# Patient Record
Sex: Male | Born: 1935 | Race: White | Hispanic: No | Marital: Married | Smoking: Never smoker
Health system: Southern US, Community
[De-identification: ages and names within clinical notes are randomized; demographics above are authoritative.]

## PROBLEM LIST (undated history)

## (undated) DIAGNOSIS — K635 Polyp of colon: Secondary | ICD-10-CM

## (undated) DIAGNOSIS — R55 Syncope and collapse: Secondary | ICD-10-CM

## (undated) DIAGNOSIS — R569 Unspecified convulsions: Secondary | ICD-10-CM

## (undated) DIAGNOSIS — T1490XA Injury, unspecified, initial encounter: Secondary | ICD-10-CM

## (undated) DIAGNOSIS — E785 Hyperlipidemia, unspecified: Secondary | ICD-10-CM

## (undated) DIAGNOSIS — R339 Retention of urine, unspecified: Secondary | ICD-10-CM

## (undated) DIAGNOSIS — M1990 Unspecified osteoarthritis, unspecified site: Secondary | ICD-10-CM

## (undated) DIAGNOSIS — I10 Essential (primary) hypertension: Secondary | ICD-10-CM

## (undated) DIAGNOSIS — I442 Atrioventricular block, complete: Secondary | ICD-10-CM

## (undated) DIAGNOSIS — R7301 Impaired fasting glucose: Secondary | ICD-10-CM

## (undated) DIAGNOSIS — E119 Type 2 diabetes mellitus without complications: Secondary | ICD-10-CM

## (undated) DIAGNOSIS — Z950 Presence of cardiac pacemaker: Secondary | ICD-10-CM

## (undated) DIAGNOSIS — K219 Gastro-esophageal reflux disease without esophagitis: Secondary | ICD-10-CM

## (undated) DIAGNOSIS — I2510 Atherosclerotic heart disease of native coronary artery without angina pectoris: Secondary | ICD-10-CM

## (undated) DIAGNOSIS — T4145XA Adverse effect of unspecified anesthetic, initial encounter: Secondary | ICD-10-CM

## (undated) DIAGNOSIS — K5909 Other constipation: Secondary | ICD-10-CM

## (undated) DIAGNOSIS — Z961 Presence of intraocular lens: Secondary | ICD-10-CM

## (undated) DIAGNOSIS — T8859XA Other complications of anesthesia, initial encounter: Secondary | ICD-10-CM

## (undated) HISTORY — DX: Essential (primary) hypertension: I10

## (undated) HISTORY — DX: Injury, unspecified, initial encounter: T14.90XA

## (undated) HISTORY — DX: Hyperlipidemia, unspecified: E78.5

## (undated) HISTORY — PX: FEMORAL HERNIA REPAIR: SHX632

## (undated) HISTORY — DX: Impaired fasting glucose: R73.01

## (undated) HISTORY — DX: Polyp of colon: K63.5

## (undated) HISTORY — PX: PACEMAKER PLACEMENT: SHX43

## (undated) HISTORY — PX: COLONOSCOPY W/ POLYPECTOMY: SHX1380

---
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
## 2001-01-24 ENCOUNTER — Encounter: Admission: RE | Admit: 2001-01-24 | Discharge: 2001-01-24 | Payer: Self-pay | Admitting: Oncology

## 2001-01-24 ENCOUNTER — Encounter (HOSPITAL_COMMUNITY): Admission: RE | Admit: 2001-01-24 | Discharge: 2001-02-23 | Payer: Self-pay | Admitting: Oncology

## 2003-07-05 ENCOUNTER — Ambulatory Visit (HOSPITAL_COMMUNITY): Admission: RE | Admit: 2003-07-05 | Discharge: 2003-07-05 | Payer: Self-pay | Admitting: Internal Medicine

## 2009-08-07 ENCOUNTER — Encounter (INDEPENDENT_AMBULATORY_CARE_PROVIDER_SITE_OTHER): Payer: Self-pay | Admitting: *Deleted

## 2009-08-07 LAB — CONVERTED CEMR LAB
ALT: 15 units/L
Albumin: 4.5 g/dL
BUN: 11 mg/dL
CO2: 27 meq/L
Glucose, Bld: 142 mg/dL
Hgb A1c MFr Bld: 6.4 %
LDL Cholesterol: 48 mg/dL
Potassium: 4.4 meq/L
Sodium: 136 meq/L

## 2009-09-26 ENCOUNTER — Ambulatory Visit: Payer: Self-pay | Admitting: Cardiology

## 2009-09-26 ENCOUNTER — Inpatient Hospital Stay (HOSPITAL_COMMUNITY): Admission: EM | Admit: 2009-09-26 | Discharge: 2009-09-28 | Payer: Self-pay | Admitting: Emergency Medicine

## 2009-09-26 ENCOUNTER — Encounter (INDEPENDENT_AMBULATORY_CARE_PROVIDER_SITE_OTHER): Payer: Self-pay | Admitting: *Deleted

## 2009-09-26 LAB — CONVERTED CEMR LAB
BUN: 9 mg/dL
Glucose, Bld: 139 mg/dL
HDL: 50 mg/dL
LDL Cholesterol: 64 mg/dL
Triglycerides: 130 mg/dL
WBC: 10.2 10*3/uL

## 2009-09-27 ENCOUNTER — Other Ambulatory Visit: Payer: Self-pay | Admitting: Orthopedic Surgery

## 2009-09-27 ENCOUNTER — Encounter (INDEPENDENT_AMBULATORY_CARE_PROVIDER_SITE_OTHER): Payer: Self-pay | Admitting: *Deleted

## 2009-09-27 ENCOUNTER — Ambulatory Visit: Payer: Self-pay | Admitting: Cardiovascular Disease

## 2009-09-27 ENCOUNTER — Ambulatory Visit: Payer: Self-pay | Admitting: Thoracic Surgery (Cardiothoracic Vascular Surgery)

## 2009-09-27 LAB — CONVERTED CEMR LAB
BUN: 15 mg/dL
GFR calc non Af Amer: >60 mL/min
Hemoglobin: 15.4 g/dL
Platelets: 171 10*3/uL
Sodium: 136 meq/L
WBC: 9.3 10*3/uL

## 2009-09-28 ENCOUNTER — Other Ambulatory Visit: Payer: Self-pay | Admitting: Orthopedic Surgery

## 2009-09-28 ENCOUNTER — Other Ambulatory Visit: Payer: Self-pay | Admitting: Cardiovascular Disease

## 2009-09-29 HISTORY — PX: CORONARY ARTERY BYPASS GRAFT: SHX141

## 2009-09-30 ENCOUNTER — Encounter (INDEPENDENT_AMBULATORY_CARE_PROVIDER_SITE_OTHER): Payer: Self-pay | Admitting: *Deleted

## 2009-09-30 DIAGNOSIS — E785 Hyperlipidemia, unspecified: Secondary | ICD-10-CM | POA: Insufficient documentation

## 2009-09-30 DIAGNOSIS — I10 Essential (primary) hypertension: Secondary | ICD-10-CM | POA: Insufficient documentation

## 2009-10-02 ENCOUNTER — Ambulatory Visit: Payer: Self-pay | Admitting: Cardiology

## 2009-10-02 DIAGNOSIS — E119 Type 2 diabetes mellitus without complications: Secondary | ICD-10-CM | POA: Insufficient documentation

## 2009-10-02 DIAGNOSIS — E1159 Type 2 diabetes mellitus with other circulatory complications: Secondary | ICD-10-CM | POA: Insufficient documentation

## 2009-10-07 ENCOUNTER — Ambulatory Visit: Payer: Self-pay | Admitting: Thoracic Surgery (Cardiothoracic Vascular Surgery)

## 2009-10-07 ENCOUNTER — Encounter: Payer: Self-pay | Admitting: Thoracic Surgery (Cardiothoracic Vascular Surgery)

## 2009-10-10 ENCOUNTER — Encounter: Payer: Self-pay | Admitting: Thoracic Surgery (Cardiothoracic Vascular Surgery)

## 2009-10-10 ENCOUNTER — Ambulatory Visit: Payer: Self-pay | Admitting: Cardiovascular Disease

## 2009-10-10 ENCOUNTER — Inpatient Hospital Stay (HOSPITAL_COMMUNITY)
Admission: RE | Admit: 2009-10-10 | Discharge: 2009-10-15 | Payer: Self-pay | Admitting: Thoracic Surgery (Cardiothoracic Vascular Surgery)

## 2009-10-23 ENCOUNTER — Ambulatory Visit: Payer: Self-pay | Admitting: Cardiology

## 2009-10-23 ENCOUNTER — Encounter: Payer: Self-pay | Admitting: Adult Health

## 2009-11-01 ENCOUNTER — Emergency Department (HOSPITAL_COMMUNITY): Admission: EM | Admit: 2009-11-01 | Discharge: 2009-11-01 | Payer: Self-pay | Admitting: Emergency Medicine

## 2009-11-04 ENCOUNTER — Encounter
Admission: RE | Admit: 2009-11-04 | Discharge: 2009-11-04 | Payer: Self-pay | Admitting: Thoracic Surgery (Cardiothoracic Vascular Surgery)

## 2009-11-04 ENCOUNTER — Ambulatory Visit: Payer: Self-pay | Admitting: Thoracic Surgery (Cardiothoracic Vascular Surgery)

## 2009-11-18 ENCOUNTER — Encounter (INDEPENDENT_AMBULATORY_CARE_PROVIDER_SITE_OTHER): Payer: Self-pay | Admitting: *Deleted

## 2009-11-25 ENCOUNTER — Ambulatory Visit: Payer: Self-pay | Admitting: Cardiology

## 2009-12-03 ENCOUNTER — Ambulatory Visit: Payer: Self-pay | Admitting: Cardiology

## 2009-12-11 ENCOUNTER — Encounter (INDEPENDENT_AMBULATORY_CARE_PROVIDER_SITE_OTHER): Payer: Self-pay | Admitting: *Deleted

## 2009-12-11 LAB — CONVERTED CEMR LAB
Albumin: 4.3 g/dL
Calcium: 9.8 mg/dL
Cholesterol: 91 mg/dL
Creatinine, Ser: 0.79 mg/dL
Eosinophils Relative: 0.2 %
HDL: 48 mg/dL
Hgb A1c MFr Bld: 5.7 %
Lymphocytes Relative: 27 %
Lymphs Abs: 1.3 10*3/uL
MCHC: 31.4 g/dL
MCV: 90.3 fL
Platelets: 158 10*3/uL
Potassium: 3.9 meq/L
RDW: 14.1 %
Sodium: 141 meq/L
WBC: 4.9 10*3/uL

## 2009-12-25 ENCOUNTER — Encounter: Payer: Self-pay | Admitting: Adult Health

## 2009-12-30 ENCOUNTER — Encounter: Payer: Self-pay | Admitting: Cardiovascular Disease

## 2009-12-30 ENCOUNTER — Ambulatory Visit: Payer: Self-pay | Admitting: Thoracic Surgery (Cardiothoracic Vascular Surgery)

## 2009-12-30 ENCOUNTER — Encounter
Admission: RE | Admit: 2009-12-30 | Discharge: 2009-12-30 | Payer: Self-pay | Admitting: Thoracic Surgery (Cardiothoracic Vascular Surgery)

## 2010-02-18 ENCOUNTER — Encounter (INDEPENDENT_AMBULATORY_CARE_PROVIDER_SITE_OTHER): Payer: Self-pay | Admitting: *Deleted

## 2010-02-24 ENCOUNTER — Ambulatory Visit: Payer: Self-pay | Admitting: Cardiology

## 2010-04-07 ENCOUNTER — Ambulatory Visit: Payer: Self-pay | Admitting: Cardiology

## 2010-04-15 ENCOUNTER — Encounter (INDEPENDENT_AMBULATORY_CARE_PROVIDER_SITE_OTHER): Payer: Self-pay

## 2010-04-15 LAB — CONVERTED CEMR LAB
ALT: 17 units/L
Albumin: 4.5 g/dL
Alkaline Phosphatase: 73 units/L
Alkaline Phosphatase: 73 units/L
CO2: 28 meq/L
Calcium: 10.3 mg/dL
Chloride: 102 meq/L
Cholesterol: 129 mg/dL
Creatinine, Ser: 0.81 mg/dL
Creatinine, Ser: 0.81 mg/dL
Glucose, Bld: 123 mg/dL
HDL: 55 mg/dL
Hgb A1c MFr Bld: 5.8 %
Sodium: 139 meq/L
Triglycerides: 60 mg/dL

## 2010-04-18 ENCOUNTER — Encounter (INDEPENDENT_AMBULATORY_CARE_PROVIDER_SITE_OTHER): Payer: Self-pay

## 2010-06-17 ENCOUNTER — Telehealth (INDEPENDENT_AMBULATORY_CARE_PROVIDER_SITE_OTHER): Payer: Self-pay

## 2010-06-22 ENCOUNTER — Encounter: Payer: Self-pay | Admitting: Thoracic Surgery (Cardiothoracic Vascular Surgery)

## 2010-07-03 NOTE — Assessment & Plan Note (Signed)
Summary: post cabg per [address] office/sn   Visit Type:  Follow-up Primary Provider:  Dr. Butch Penny  CC:  no cardiology complaints today.  History of Present Illness: Mr Guy Roberson is a very pleasant 77 y/o CM who we are seeing on follow-up after undergoing CABG per Dr. Cornelius Moras 10/10/2009 (LIMA-LAD;svg-Diag 1;SVG to OM; SVG-PDA, with endoscopic harvesting from R. thigh and R lower leg.).  This was completed after abnormal cardiac cath performed by Dr. Clifton James revealing severe 3 vessel CAD with preserved LV fx.  He was referred for CABG. Also during hosptialization, he was found to have urinary retention and urology placed on indwelling f/c which he continues to wear with a leg bag per Dr. Vernie Ammons. He was placed on Tamsulosin at that time.  Since discharge he is feeling stronger, but not as active as in the past.  He is taking each day at a time and walking as much as he can before fatigued.  He denies SOB, severe pain or fluid retention.  He has some complaints of posteral dizziness when he awakens in the morning to empty his catheter.  He weighs himself daily and has actually lost 6 lbs since discharge.  Current Medications (verified): 1)  Temazepam 30 Mg Caps (Temazepam) .... Take 1 Tab Nightly 2)  Metoprolol Tartrate 100 Mg Tabs (Metoprolol Tartrate) .... Take 1/2  Tablet By Mouth Twice A Day 3)  Simvastatin 20 Mg Tabs (Simvastatin) .... Take 1 Tab Daily 4)  Ciprofloxacin Hcl 250 Mg Tabs (Ciprofloxacin Hcl) .... Take 1 Tab Two Times A Day 5)  Furosemide 40 Mg Tabs (Furosemide) .... Take 1 Tab Daily 6)  Lisinopril 10 Mg Tabs (Lisinopril) .... Take 1 Tab Daily 7)  Ferrex 150 150 Mg Caps (Polysaccharide Iron Complex) .... Take 1 Tab Daily 8)  Potassium Chloride 20 Meq Pack (Potassium Chloride) .... Take 1 Tab Daily 9)  Tramadol Hcl 50 Mg Tabs (Tramadol Hcl) .... Take 4 To 6 Hrs As Needed For Pain 10)  Aspir-Trin 325 Mg Tbec (Aspirin) .... Take 1 Tab Daily 11)  Tamsulosin Hcl 0.4 Mg Caps  (Tamsulosin Hcl) .... Take 1 Tab Daily  Allergies (verified): 1)  ! Penicillin  Past History:  Past Surgical History: Hernia repair bladder surgery pelvis and spianl surgery CABG May 10/10/09  Social History: Reviewed history from 09/30/2009 and no changes required. Retired  Married  Tobacco Use - Yes. (smokeless tobacco) Alcohol Use - no Regular Exercise - yes Drug Use - no  Review of Systems       Postural dizziness.  Urinary retention. Post-operative soreness.  All other systems have been reviewed and are negative unless stated above.   Vital Signs:  Patient profile:   77 year old male Weight:      164 pounds Pulse rate:   64 / minute BP sitting:   110 / 54  (right arm)  Vitals Entered By: Dreama Saa, CNA (Oct 23, 2009 1:26 PM)  Physical Exam  General:  healthy appearing.   Chest Wall:  Medial well healed sternotomy incision with some old ecchymosis noted.  No bleeding or signs of infection. Lungs:  Clear bilaterally to auscultation and percussion. Heart:  RRR distant without MRG Abdomen:  Bowel sounds positive; abdomen soft and non-tender without masses, organomegaly, or hernias noted. No hepatosplenomegaly. Genitalia:  Urinary catheter leg bag in place. Msk:  Back normal, normal gait. Muscle strength and tone normal. Extremities:  Healing SVG harvest sites on R lower leg. Psych:  Normal affect.  Impression & Recommendations:  Problem # 1:  CAD, ARTERY BYPASS GRAFT (ICD-414.04) Mr. Trumbull is doing well post-operatively.  He is slowly increasing his activity and exercise.  I have cautioned him on the need to take things slowily and to rest on days when his energy is low.  His HR and BP are well controlled.  He denies symptoms.  I have discussed the LEE edema which is going to be normal on the SVG harvest leg.  He should continue to weigh daily and report any evidence of fluid retention or SOB. The following medications were removed from the medication list:     Isosorbide Mononitrate Cr 30 Mg Xr24h-tab (Isosorbide mononitrate) .Marland Kitchen... Take 1/2  tablet by mouth daily    Nitrostat 0.4 Mg Subl (Nitroglycerin) .Marland Kitchen... 1 tablet under tongue at onset of chest pain; you may repeat every 5 minutes for up to 3 doses. His updated medication list for this problem includes:    Metoprolol Tartrate 100 Mg Tabs (Metoprolol tartrate) .Marland Kitchen... Take 1/2  tablet by mouth twice a day    Lisinopril 10 Mg Tabs (Lisinopril) .Marland Kitchen... Take 1 tab daily    Aspir-trin 325 Mg Tbec (Aspirin) .Marland Kitchen... Take 1 tab daily  Problem # 2:  URINARY RETENTION (ICD-788.20) I have ordered him a large catheter bag to use for HS.  He complains of over flowing leg bag during the night.,  I have discussed aspectic technique when changing the bags.  The early morning dizziness may be related to use of tamsulosin.  He is advised to be careful when standing. Orders: Durable Medical Equipment (DME)  Patient Instructions: 1)  Your physician recommends that you schedule a follow-up appointment in: 1 month

## 2010-07-03 NOTE — Progress Notes (Signed)
Summary: [facility]   [facility]   Imported By: Roderic Ovens 01/08/2010 14:54:09  _____________________________________________________________________  External Attachment:    Type:   Image     Comment:   External Document

## 2010-07-03 NOTE — Progress Notes (Signed)
**Note De-Identified [name] Obfuscation** Summary: DOES PT NEEDS ANTIBIOTICS FOR DENTIST TO PULL TEETH  Phone Note From Other Clinic Call back at [phone]   Caller: DR [facility] Request: Talk with Nurse Summary of Call: PT NEEDS TO HAVE SOME TEETH EXTRACTED AND DR Myna Bright NEEDS TO KNOW IF PATIENT NEEDS TO BE ON ANTIBIOTICS. Initial call taken by: Faythe Ghee,  June 17, 2010 3:06 PM  Follow-up for Phone Call        Dr. Georgiann Mccoy office advised that pt. does not need abx  before dental procedures. Follow-up by: Larita Fife Kailey Esquilin LPN,  June 17, 2010 4:51 PM

## 2010-07-03 NOTE — Assessment & Plan Note (Signed)
Summary: 1 mth f/u per checkout on 10/23/09/tg   Primary Provider:  Dr. Butch Penny  CC:  None.  History of Present Illness: Guy Roberson is a very pleasant 77 y/o CM who we are seeing on follow-up after undergoing CABG per Dr. Cornelius Moras 10/10/2009 (LIMA-LAD;svg-Diag 1;SVG to OM; SVG-PDA, with endoscopic harvesting from R. thigh and R lower leg.).  This was completed after abnormal cardiac cath performed by Dr. Clifton James revealing severe 3 vessel CAD with preserved LV fx.  He was referred for CABG. Also during hosptialization, he was found to have urinary retention and urology placed on indwelling f/c This was removed 2 weeks ago. Since last visit, he continues to feel stronger and is walking two-three times a day.  This is prompted by a small dog which requires frequent walks outside.  He never goes more than a quarter of a mile each time.  He continues on his wt restrictions for lifting things over 10 lbs.  Denies any exerional dyspnea or chest pain.  " I feel good!"  He had been taking propranolol 20mg  up until his surgery, but since discharge this was not listed on his medications.  He took this intially for "shaking" but this is no longer an issue.  Preventive Screening-Counseling & Management  Alcohol-Tobacco     Smoking Status: never  Current Medications (verified): 1)  Temazepam 30 Mg Caps (Temazepam) .... Take 1 Tab Nightly 2)  Metoprolol Tartrate 100 Mg Tabs (Metoprolol Tartrate) .... Take 1/2  Tablet By Mouth Twice A Day 3)  Simvastatin 20 Mg Tabs (Simvastatin) .... Take 1 Tab Daily 4)  Lisinopril 20 Mg Tabs (Lisinopril) .... Take One Tablet By Mouth Daily 5)  Tramadol Hcl 50 Mg Tabs (Tramadol Hcl) .... Take 4 To 6 Hrs As Needed For Pain 6)  Aspir-Trin 325 Mg Tbec (Aspirin) .... Take 1 Tab Daily 7)  Tamsulosin Hcl 0.4 Mg Caps (Tamsulosin Hcl) .... Take 1 Tab Daily 8)  Vitamin D 400 Unit Caps (Cholecalciferol) .Marland Kitchen.. 1 Tablet By Mouth Once Daily  Allergies (verified): 1)  !  Penicillin  Social History: Smoking Status:  never  Review of Systems       All other systems have been reviewed and are negative unless stated above.   Vital Signs:  Patient profile:   77 year old male Weight:      158 pounds O2 Sat:      98 % Pulse rate:   62 / minute BP sitting:   147 / 60  (left arm)  Vitals Entered ByLarita Fife Via LPN (November 25, 2009 11:13 AM)  Physical Exam  General:  Well developed, well nourished, in no acute distress. Neck:  Neck supple, no JVD. No masses, thyromegaly or abnormal cervical nodes. Chest Wall:  Well healed sternotomy scar. Lungs:  CTA. No wheezes.  Good inspiratory effort. Heart:  Distant with RRR no MRG Abdomen:  Bowel sounds positive; abdomen soft and non-tender without masses, organomegaly, or hernias noted. No hepatosplenomegaly. Msk:  Back normal, normal gait. Muscle strength and tone normal. Pulses:  pulses normal in all 4 extremities Extremities:  Well healed vein graft scars/incisions Neurologic:  Alert and oriented x 3. Psych:  Normal affect.   Impression & Recommendations:  Problem # 1:  CAD, ARTERY BYPASS GRAFT (ICD-414.04) He is doing well from a CV standpoint.  He is without complaint and he is becoming more active, albeit slowly. He is adhereing to a cardiac diet and has lost approx 14 lbs since discharge from  CABG. His updated medication list for this problem includes:    Metoprolol Tartrate 100 Mg Tabs (Metoprolol tartrate) .Marland Kitchen... Take 1/2  tablet by mouth twice a day    Lisinopril 20 Mg Tabs (Lisinopril) .Marland Kitchen... Take one tablet by mouth daily    Aspir-trin 325 Mg Tbec (Aspirin) .Marland Kitchen... Take 1 tab daily  Problem # 2:  HYPERTENSION (ICD-401.9) I will not restart the propanolol and instead increase the dose of lisinopril to 20mg  daily from 10mg  daily.  He will return to the office for a BP check in one week.  Should he improve his BP will continue this. Will need to have BMET drawn in one month.  See him in 3 months. The  following medications were removed from the medication list:    Furosemide 40 Mg Tabs (Furosemide) .Marland Kitchen... Take 1 tab daily His updated medication list for this problem includes:    Metoprolol Tartrate 100 Mg Tabs (Metoprolol tartrate) .Marland Kitchen... Take 1/2  tablet by mouth twice a day    Lisinopril 20 Mg Tabs (Lisinopril) .Marland Kitchen... Take one tablet by mouth daily    Aspir-trin 325 Mg Tbec (Aspirin) .Marland Kitchen... Take 1 tab daily  Patient Instructions: 1)  Your physician recommends that you schedule a follow-up appointment in: 3 months 2)  Your physician has recommended you make the following change in your medication:  increase lisinopril to 20mg  daily 3)  You have been referred to nurse visit in 1 week for bp check Prescriptions: LISINOPRIL 20 MG TABS (LISINOPRIL) Take one tablet by mouth daily  #30 x 3   Entered by:   Teressa Lower RN   Authorized by:   Joni Reining, NP   Signed by:   Teressa Lower RN on 11/25/2009   Method used:   Electronically to        [facility]* (retail)       [address]       [place], [place]  [address]       Ph: [phone] or [phone]       Fax: [phone]   RxID:   [id number]

## 2010-07-03 NOTE — Miscellaneous (Signed)
Summary: metoprolol refill  Clinical Lists Changes  Medications: Changed medication from METOPROLOL TARTRATE 100 MG TABS (METOPROLOL TARTRATE) Take 1/2  tablet by mouth twice a day to METOPROLOL TARTRATE 50 MG TABS (METOPROLOL TARTRATE) Take one tablet by mouth twice a day - Signed Rx of METOPROLOL TARTRATE 50 MG TABS (METOPROLOL TARTRATE) Take one tablet by mouth twice a day;  #60 x 6;  Signed;  Entered by: Teressa Lower RN;  Authorized by: Joni Reining, NP;  Method used: Electronically to [facility]*, [address], [place], [place], [place]  [address], Ph: [phone] or [phone], Fax: [phone]    Prescriptions: METOPROLOL TARTRATE 50 MG TABS (METOPROLOL TARTRATE) Take one tablet by mouth twice a day  #60 x 6   Entered by:   Teressa Lower RN   Authorized by:   Joni Reining, NP   Signed by:   Teressa Lower RN on 12/25/2009   Method used:   Electronically to        [facility] (retail)       [address]       [place], [place]  [address]       Ph: [phone] or [phone]       Fax: [phone]   RxID:   [id number]

## 2010-07-03 NOTE — Miscellaneous (Signed)
Summary: LABS CBCD,CMPLIPIDS,A1C,12/11/2009  Clinical Lists Changes  Observations: Added new observation of CALCIUM: 9.8 mg/dL (16/03/9603 54:09) Added new observation of ALBUMIN: 4.3 g/dL (81/19/1478 29:56) Added new observation of PROTEIN, TOT: 6.5 g/dL (21/30/8657 84:69) Added new observation of SGPT (ALT): 13 units/L (12/11/2009 10:52) Added new observation of SGOT (AST): 17 units/L (12/11/2009 10:52) Added new observation of ALK PHOS: 61 units/L (12/11/2009 10:52) Added new observation of CREATININE: 0.79 mg/dL (62/95/2841 32:44) Added new observation of BUN: 11 mg/dL (06/03/7251 66:44) Added new observation of BG RANDOM: 128 mg/dL (03/47/4259 56:38) Added new observation of CO2 PLSM/SER: 26 meq/L (12/11/2009 10:52) Added new observation of CL SERUM: 106 meq/L (12/11/2009 10:52) Added new observation of K SERUM: 3.9 meq/L (12/11/2009 10:52) Added new observation of NA: 141 meq/L (12/11/2009 10:52) Added new observation of LDL: 28 mg/dL (75/64/3329 51:88) Added new observation of HDL: 48 mg/dL (41/66/0630 16:01) Added new observation of TRIGLYC TOT: 73 mg/dL (09/32/3557 32:20) Added new observation of CHOLESTEROL: 91 mg/dL (25/42/7062 37:62) Added new observation of HGBA1C: 5.7 % (12/11/2009 10:52) Added new observation of EOS ABSLT: 0.0 K/uL (12/11/2009 10:52) Added new observation of % EOS AUTO: 0.2 % (12/11/2009 10:52) Added new observation of ABSOLUTE MON: 0.6 K/uL (12/11/2009 10:52) Added new observation of MONOCYTE %: 11 % (12/11/2009 10:52) Added new observation of ABS LYMPHOCY: 1.3 K/uL (12/11/2009 10:52) Added new observation of LYMPHS %: 27 % (12/11/2009 10:52) Added new observation of PLATELETK/UL: 158 K/uL (12/11/2009 10:52) Added new observation of RDW: 14.1 % (12/11/2009 10:52) Added new observation of MCHC RBC: 31.4 g/dL (83/15/1761 60:73) Added new observation of MCV: 90.3 fL (12/11/2009 10:52) Added new observation of HCT: 44.6 % (12/11/2009 10:52) Added new  observation of HGB: 14.0 g/dL (71/10/2692 85:46) Added new observation of RBC M/UL: 4.94 M/uL (12/11/2009 10:52) Added new observation of WBC COUNT: 4.9 10*3/microliter (12/11/2009 10:52)

## 2010-07-03 NOTE — Assessment & Plan Note (Signed)
Summary: 1 WK NURSE VISIT PER CHECKOUT ON 11/25/09/TG  Nurse Visit   Vital Signs:  Patient profile:   77 year old male Height:      67 inches Weight:      159 pounds Pulse rate:   64 / minute BP sitting:   147 / 58  (right arm)  Vitals Entered By: Dreama Saa, CNA (December 03, 2009 8:26 AM)  Visit Type:  1 week nurse visit Primary Provider:  Dr. Butch Penny   History of Present Illness: S: 1 week nurse visit B: ov 11/25/09 w/ [place], increased lisinopril to 20mg  daily A: only c/o slight epigastric/chest pain consuming hotdogs at a 4th July cookout, he denied radiation, jaw pain or  diaphoresis  R:   Preventive Screening-Counseling & Management  Alcohol-Tobacco     Smoking Status: never  Current Medications (verified): 1)  Temazepam 30 Mg Caps (Temazepam) .... Take 1 Tab Nightly 2)  Metoprolol Tartrate 100 Mg Tabs (Metoprolol Tartrate) .... Take 1/2  Tablet By Mouth Twice A Day 3)  Simvastatin 20 Mg Tabs (Simvastatin) .... Take 1 Tab Daily 4)  Lisinopril 20 Mg Tabs (Lisinopril) .... Take One Tablet By Mouth Daily 5)  Tramadol Hcl 50 Mg Tabs (Tramadol Hcl) .... Take 4 To 6 Hrs As Needed For Pain 6)  Aspir-Trin 325 Mg Tbec (Aspirin) .... Take 1 Tab Daily 7)  Tamsulosin Hcl 0.4 Mg Caps (Tamsulosin Hcl) .... Take 1 Tab Daily 8)  Vitamin D 400 Unit Caps (Cholecalciferol) .Marland Kitchen.. 1 Tablet By Mouth Once Daily  Allergies (verified): 1)  ! Penicillin  Appended Document: 1 WK NURSE VISIT PER CHECKOUT ON 11/25/09/TG LMOM

## 2010-07-03 NOTE — Assessment & Plan Note (Signed)
Summary: 3 MTH F/U PER CHECKOUT ON 11/25/09/TG   Visit Type:  Follow-up Primary Zareya Tuckett:  Dr.Knowlton   History of Present Illness: [name] returns to the office as scheduled for continued assessment and treatment of coronary disease, now 5 months following uncomplicated CABG surgery.  He has done superbly postoperatively with a rapid return to his usual level of activity despite not participating in any formal cardiac rehabilitation program.  He walks his dog multiple times per day, mows 5 yards and is rarely sedentary.  He denies dyspnea, orthopnea, PND, pedal edema or chest discomfort.  Current Medications (verified): 1)  Temazepam 30 Mg Caps (Temazepam) .... Take 1 Tab Nightly 2)  Metoprolol Tartrate 50 Mg Tabs (Metoprolol Tartrate) .... Take One Tablet By Mouth Twice A Day 3)  Simvastatin 20 Mg Tabs (Simvastatin) .... Take 1 Tab Daily 4)  Lisinopril-Hydrochlorothiazide 20-12.5 Mg Tabs (Lisinopril-Hydrochlorothiazide) .... Take 1 Tablet By Mouth Once Daily 5)  Tramadol Hcl 50 Mg Tabs (Tramadol Hcl) .... Take 4 To 6 Hrs As Needed For Pain 6)  Aspir-Trin 325 Mg Tbec (Aspirin) .... Take 1 Tab Daily 7)  Tamsulosin Hcl 0.4 Mg Caps (Tamsulosin Hcl) .... Take 1 Tab Daily 8)  Vitamin B-12 1000 Mcg Tabs (Cyanocobalamin) .... Take 1tab Daily  Allergies (verified): 1)  ! Penicillin  Past History:  PMH, FH, and Social History reviewed and updated.  Review of Systems       See history of present illness  Vital Signs:  Patient profile:   77 year old male Weight:      161 pounds Pulse rate:   54 / minute BP sitting:   145 / 58  (right arm)  Vitals Entered By: Dreama Saa, CNA (February 24, 2010 11:26 AM)  Physical Exam  General:  Trim; well developed; no acute distress:   Neck-No JVD; no carotid bruits: Lungs-No tachypnea, no rales; no rhonchi; no wheezes: Cardiovascular-normal PMI; normal S1 and S2:prominent fourth heart sound; minimal systolic murmur at the  apex Abdomen-BS normal; soft and non-tender without masses or organomegaly:  Musculoskeletal-No deformities, no cyanosis or clubbing: Neurologic-Normal cranial nerves; symmetric strength and tone:  Skin-Warm, no significant lesions: Extremities-1-2+ distal pulses; no edema:     Impression & Recommendations:  Problem # 1:  ATHEROSCLEROTIC CV  DISEASE-CABG (ICD-429.2) Patient is doing beautifully postoperatively and has been released by the cardiovascular surgeons.  He is maintaining a high level of activity without cardiopulmonary symptoms.  Problem # 2:  HYPERLIPIDEMIA (ICD-272.4) Lipid values are extremely low on a low dose of statin, which will be continued.  Problem # 3:  HYPERTENSION (ICD-401.9) Control of hypertension is suboptimal and has been at prior office visits.  Hydrochlorothiazide will be added to his medical regime with home monitoring of blood pressures and a return visit to the cardiology nurses in one month.  If BP is good at that time, will plan to see him again in one year.   BP today: 145/58 Prior BP: 147/58 (12/03/2009)  Labs Reviewed: K+: 3.9 (12/11/2009) Creat: : 0.79 (12/11/2009)   Chol: 91 (12/11/2009)   HDL: 48 (12/11/2009)   LDL: 28 (12/11/2009)   TG: 73 (12/11/2009)  Other Orders: Future Orders: T-Basic Metabolic Panel [id number]) ... 03/24/2010  Patient Instructions: 1)  Your physician recommends that you schedule a follow-up appointment in: 1 month for blood pressure check with nurse and 1 year with Dr. Dietrich Pates 2)  Your physician recommends that you return for lab work in: 1 month 3)  Your physician  has recommended you make the following change in your medication: Change Lisinopril to Lisinopril/HCT 20/12.5mg  by mouth once daily  4)  Your physician has requested that you regularly monitor and record your blood pressure readings at home.  Please use the same machine at the same time of day to check your readings and record them to bring to your  follow-up visit. Prescriptions: LISINOPRIL-HYDROCHLOROTHIAZIDE 20-12.5 MG TABS (LISINOPRIL-HYDROCHLOROTHIAZIDE) take 1 tablet by mouth once daily  #30 x 11   Entered by:   Larita Fife Via LPN   Authorized by:   Kathlen Brunswick, MD, [facility]   Signed by:   Larita Fife Via LPN on 98/04/9146   Method used:   Electronically to        [facility]* (retail)       [address]       [place], [place]  [address]       Ph: [phone] or [phone]       Fax: [phone]   RxID:   [id number]

## 2010-07-03 NOTE — Miscellaneous (Signed)
Summary: HOSPITAL LABS   Clinical Lists Changes  Observations: Added new observation of CALCIUM: 9.5 mg/dL (04/54/0981 19:14) Added new observation of GFR AA: >60 mL/min/1.34m2 (09/27/2009 10:03) Added new observation of GFR: >60 mL/min (09/27/2009 10:03) Added new observation of CREATININE: 0.84 mg/dL (78/29/5621 30:86) Added new observation of BUN: 15 mg/dL (57/84/6962 95:28) Added new observation of BG RANDOM: 152 mg/dL (41/32/4401 02:72) Added new observation of CO2 PLSM/SER: 26 meq/L (09/27/2009 10:03) Added new observation of CL SERUM: 103 meq/L (09/27/2009 10:03) Added new observation of K SERUM: 3.5 meq/L (09/27/2009 10:03) Added new observation of NA: 136 meq/L (09/27/2009 10:03) Added new observation of PLATELETK/UL: 171 K/uL (09/27/2009 10:03) Added new observation of MCV: 91.6 fL (09/27/2009 10:03) Added new observation of HCT: 43.3 % (09/27/2009 10:03) Added new observation of HGB: 15.4 g/dL (53/66/4403 47:42) Added new observation of WBC COUNT: 9.3 10*3/microliter (09/27/2009 10:03) Added new observation of LDL: 64 mg/dL (59/56/3875 64:33) Added new observation of HDL: 50 mg/dL (29/51/8841 66:06) Added new observation of TRIGLYC TOT: 130 mg/dL (30/16/0109 32:35) Added new observation of CHOLESTEROL: 140 mg/dL (57/32/2025 42:70) Added new observation of GFR AA: >60 mL/min/1.28m2 (09/26/2009 10:03) Added new observation of GFR: >60 mL/min (09/26/2009 10:03) Added new observation of CREATININE: 0.74 mg/dL (62/37/6283 15:17) Added new observation of BUN: 9 mg/dL (61/60/7371 06:26) Added new observation of BG RANDOM: 139 mg/dL (94/85/4627 03:50) Added new observation of CO2 PLSM/SER: 28 meq/L (09/26/2009 10:03) Added new observation of CL SERUM: 102 meq/L (09/26/2009 10:03) Added new observation of K SERUM: 3.7 meq/L (09/26/2009 10:03) Added new observation of NA: 137 meq/L (09/26/2009 10:03) Added new observation of PLATELETK/UL: 179 K/uL (09/26/2009 10:03) Added new  observation of MCV: 89.2 fL (09/26/2009 10:03) Added new observation of HCT: 47.0 % (09/26/2009 10:03) Added new observation of HGB: 16.9 g/dL (09/38/1829 93:71) Added new observation of WBC COUNT: 10.2 10*3/microliter (09/26/2009 10:03)

## 2010-07-03 NOTE — Letter (Signed)
Summary: BP LOG  BP LOG   Imported By: Faythe Ghee 04/07/2010 11:19:31  _____________________________________________________________________  External Attachment:    Type:   Image     Comment:   External Document

## 2010-07-03 NOTE — Miscellaneous (Signed)
Summary: CMP, Lipids, and HGB A1c  Clinical Lists Changes  Observations: Added new observation of CALCIUM: 10.3 mg/dL (16/03/9603 54:09) Added new observation of ALBUMIN: 4.5 g/dL (81/19/1478 29:56) Added new observation of PROTEIN, TOT: 6.7 g/dL (21/30/8657 84:69) Added new observation of SGPT (ALT): 17 units/L (04/15/2010 11:06) Added new observation of SGOT (AST): 20 units/L (04/15/2010 11:06) Added new observation of ALK PHOS: 73 units/L (04/15/2010 11:06) Added new observation of BILI DIRECT: Bili Total:0.6 mg/dL (62/95/2841 32:44) Added new observation of CREATININE: 0.81 mg/dL (06/03/7251 66:44) Added new observation of BUN: 15 mg/dL (03/47/4259 56:38) Added new observation of BG RANDOM: 123 mg/dL (75/64/3329 51:88) Added new observation of CO2 PLSM/SER: 28 meq/L (04/15/2010 11:06) Added new observation of CL SERUM: 102 meq/L (04/15/2010 11:06) Added new observation of K SERUM: 4.3 meq/L (04/15/2010 11:06) Added new observation of NA: 139 meq/L (04/15/2010 11:06) Added new observation of HGBA1C: 5.8 % (04/15/2010 11:06)

## 2010-07-03 NOTE — Miscellaneous (Signed)
Summary: CMP, Lipids, and HGB A1c  Clinical Lists Changes  Observations: Added new observation of CALCIUM: 10.3 mg/dL (16/03/9603 54:09) Added new observation of ALBUMIN: 4.5 g/dL (81/19/1478 29:56) Added new observation of PROTEIN, TOT: 6.7 g/dL (21/30/8657 84:69) Added new observation of SGPT (ALT): 17 units/L (04/15/2010 11:20) Added new observation of SGOT (AST): 20 units/L (04/15/2010 11:20) Added new observation of ALK PHOS: 73 units/L (04/15/2010 11:20) Added new observation of BILI DIRECT: Bili Total: 0.6 mg/dL (62/95/2841 32:44) Added new observation of CREATININE: 0.81 mg/dL (06/03/7251 66:44) Added new observation of BUN: 15 mg/dL (03/47/4259 56:38) Added new observation of BG RANDOM: 123 mg/dL (75/64/3329 51:88) Added new observation of CO2 PLSM/SER: 28 meq/L (04/15/2010 11:20) Added new observation of CL SERUM: 102 meq/L (04/15/2010 11:20) Added new observation of K SERUM: 4.3 meq/L (04/15/2010 11:20) Added new observation of NA: 139 meq/L (04/15/2010 11:20) Added new observation of LDL: 62 mg/dL (41/66/0630 16:01) Added new observation of HDL: 55 mg/dL (09/32/3557 32:20) Added new observation of TRIGLYC TOT: 60 mg/dL (25/42/7062 37:62) Added new observation of CHOLESTEROL: 129 mg/dL (83/15/1761 60:73) Added new observation of HGBA1C: 5.8 % (04/15/2010 11:20)

## 2010-07-03 NOTE — Assessment & Plan Note (Signed)
Summary: post hosp [facility] per pt phone call/tg   Visit Type:  post hosp [facility] Primary Provider:  Dr. Butch Penny   History of Present Illness: Return office visit following a brief admission to [facility] for cardiac catheterization.  That study revealed severe three-vessel coronary disease including total obstruction of the mid LAD.  LV systolic function was normal.  Mr. Melody was referred to Dr. Barry Dienes, who plans CABG surgery 8 days from now.  Patient has had no symptoms since returning home, but is markedly limiting his activity.  His hospital course was uncomplicated except for a minor bleed from the radial artery when the dressing was removed.  Current Medications (verified): 1)  Temazepam 30 Mg Caps (Temazepam) .... Take 1 Tab Nightly 2)  Metoprolol Tartrate 100 Mg Tabs (Metoprolol Tartrate) .... Take 1/2  Tablet By Mouth Twice A Day 3)  Simvastatin 20 Mg Tabs (Simvastatin) .... Take 1 Tab Daily 4)  Hydrochlorothiazide 25 Mg Tabs (Hydrochlorothiazide) .... Take 1 Tab Daily 5)  Vitamin D 400 Unit Caps (Cholecalciferol) .... Take 1 Tab Daily 6)  Isosorbide Mononitrate Cr 30 Mg Xr24h-Tab (Isosorbide Mononitrate) .... Take 1/2  Tablet By Mouth Daily 7)  Nitrostat 0.4 Mg Subl (Nitroglycerin) .Marland Kitchen.. 1 Tablet Under Tongue At Onset of Chest Pain; You May Repeat Every 5 Minutes For Up To 3 Doses.  Allergies (verified): 1)  ! Penicillin  Past History:  PMH, FH, and Social History reviewed and updated.  Past Surgical History: Hernia repair bladder surgery pelvis and spianl surgery  Family History: Siblings:1 brother coronary artery bypass graft;  later died with  lung cancer. 2 brothers-CABG mother deceased-pacer and valve replacement 30 yrs old father-deceased AAA 69 yrs old   Review of Systems       See history of present illness.  Vital Signs:  Patient profile:   77 year old male Height:      67 inches Weight:      163 pounds BMI:     25.62 O2 Sat:      99 % on  Room air Pulse rate:   57 / minute BP sitting:   141 / 62  (left arm)  Vitals Entered By: Dreama Saa, CNA (Oct 02, 2009 11:53 AM)  Nutrition Counseling: Patient's BMI is greater than 25 and therefore counseled on weight management options.  O2 Flow:  Room air  Physical Exam  General:    Proportionate weight and height; well developed; no acute distress:    Neck-No JVD, no carotid bruits: Lungs-No tachypnea, no rales; no rhonchi;no wheezes:  Cardiovascular-normal PMI; normal S1 and S2; modest systolic ejection murmur Abdomen-BS normal; soft and non-tender without masses or organomegaly:  Skin-Warm, multiple small nodules of keratinization over the lower legs and feet Extremities-Nl distal pulses; no edema:    Impression & Recommendations:  Problem # 1:  ATHEROSCLEROTIC CARDIOVASCULAR DISEASE (ICD-429.2) Mr. Mcfadden is an excellent candidate for CABG surgery.  Current medication is ideal with good control of hypertension, mild bradycardia indicating a maximal dose of beta blocker and good control of hyperlipidemia.  I will plan to see this nice gentleman after his cardiac surgery has been completed.  Patient Instructions: 1)  Your physician recommends that you schedule a follow-up appointment in: post surgery 2)  Your physician recommends that you continue on your current medications as directed. Please refer to the Current Medication list given to you today.

## 2010-07-03 NOTE — Assessment & Plan Note (Signed)
Summary: 6 wk nurse visit per checkout on 02/24/10/tg  Nurse Visit   Vital Signs:  Patient profile:   77 year old male Height:      67 inches Weight:      157 pounds O2 Sat:      97 % on Room air Temp:     97.7 degrees F oral Pulse rate:   58 / minute BP sitting:   137 / 58  (left arm)  Vitals Entered By: Teressa Lower RN (April 07, 2010 9:04am  O2 Flow:  Room air  Current Medications (verified): 1)  Temazepam 30 Mg Caps (Temazepam) .... Take 1 Tab Nightly 2)  Metoprolol Tartrate 50 Mg Tabs (Metoprolol Tartrate) .... Take One Tablet By Mouth Twice A Day 3)  Simvastatin 20 Mg Tabs (Simvastatin) .... Take 1 Tab Daily 4)  Lisinopril-Hydrochlorothiazide 20-12.5 Mg Tabs (Lisinopril-Hydrochlorothiazide) .... Take 1 Tablet By Mouth Once Daily 5)  Tramadol Hcl 50 Mg Tabs (Tramadol Hcl) .... Take 4 To 6 Hrs As Needed For Pain 6)  Aspir-Trin 325 Mg Tbec (Aspirin) .... Take 1 Tab Daily 7)  Tamsulosin Hcl 0.4 Mg Caps (Tamsulosin Hcl) .... Take 1 Tab Daily 8)  Vitamin B-12 1000 Mcg Tabs (Cyanocobalamin) .... Take 1tab Daily  Allergies (verified): 1)  ! Penicillin  Primary Provider:  Dr.Knowlton   History of Present Illness: S: 84month  nurse visit B: added hct to lisinopril 20/12.5  no other changes A: denies c/o, returned bp diary scanned into record R: continue to monitor bp  04/12/10 Continue Rx. [place] Bing, M.D.

## 2010-07-03 NOTE — Miscellaneous (Signed)
Summary: LABS CMP,LIPID,A1C,08/07/2009  Clinical Lists Changes  Observations: Added new observation of CALCIUM: 9.4 mg/dL (62/13/0865 78:46) Added new observation of ALBUMIN: 4.5 g/dL (96/29/5284 13:24) Added new observation of PROTEIN, TOT: 6.7 g/dL (40/03/2724 36:64) Added new observation of SGPT (ALT): 15 units/L (08/07/2009 10:11) Added new observation of SGOT (AST): 16 units/L (08/07/2009 10:11) Added new observation of ALK PHOS: 63 units/L (08/07/2009 10:11) Added new observation of CREATININE: 0.69 mg/dL (40/34/7425 95:63) Added new observation of BUN: 11 mg/dL (87/56/4332 95:18) Added new observation of BG RANDOM: 142 mg/dL (84/16/6063 01:60) Added new observation of CO2 PLSM/SER: 27 meq/L (08/07/2009 10:11) Added new observation of CL SERUM: 98 meq/L (08/07/2009 10:11) Added new observation of K SERUM: 4.4 meq/L (08/07/2009 10:11) Added new observation of NA: 136 meq/L (08/07/2009 10:11) Added new observation of LDL: 48 mg/dL (10/93/2355 73:22) Added new observation of HDL: 45 mg/dL (02/54/2706 23:76) Added new observation of TRIGLYC TOT: 92 mg/dL (28/31/5176 16:07) Added new observation of CHOLESTEROL: 111 mg/dL (37/03/6268 48:54) Added new observation of HGBA1C: 6.4 % (08/07/2009 10:11)

## 2010-08-18 LAB — URINALYSIS, ROUTINE W REFLEX MICROSCOPIC
Glucose, UA: NEGATIVE mg/dL
Leukocytes, UA: NEGATIVE
Nitrite: NEGATIVE
Specific Gravity, Urine: 1.007 (ref 1.005–1.030)

## 2010-08-18 LAB — CBC
Hemoglobin: 10.3 g/dL — ABNORMAL LOW (ref 13.0–17.0)
MCHC: 34.4 g/dL (ref 30.0–36.0)
MCV: 90.9 fL (ref 78.0–100.0)
Platelets: 105 10*3/uL — ABNORMAL LOW (ref 150–400)

## 2010-08-18 LAB — URINE MICROSCOPIC-ADD ON

## 2010-08-19 LAB — GLUCOSE, CAPILLARY
Glucose-Capillary: 142 mg/dL — ABNORMAL HIGH (ref 70–99)
Glucose-Capillary: 150 mg/dL — ABNORMAL HIGH (ref 70–99)
Glucose-Capillary: 172 mg/dL — ABNORMAL HIGH (ref 70–99)
Glucose-Capillary: 173 mg/dL — ABNORMAL HIGH (ref 70–99)
Glucose-Capillary: 69 mg/dL — ABNORMAL LOW (ref 70–99)
Glucose-Capillary: 133 mg/dL — ABNORMAL HIGH (ref 70–99)

## 2010-08-19 LAB — CARDIAC PANEL(CRET KIN+CKTOT+MB+TROPI)
CK, MB: 1.3 ng/mL (ref 0.3–4.0)
Relative Index: INVALID (ref 0.0–2.5)
Total CK: 45 U/L (ref 7–232)

## 2010-08-19 LAB — PROTIME-INR
INR: 1.6 — ABNORMAL HIGH (ref 0.00–1.49)
INR: 1.21 (ref 0.00–1.49)
Prothrombin Time: 18.9 seconds — ABNORMAL HIGH (ref 11.6–15.2)
Prothrombin Time: 19.4 seconds — ABNORMAL HIGH (ref 11.6–15.2)

## 2010-08-19 LAB — POCT I-STAT 3, ART BLOOD GAS (G3+)
Acid-Base Excess: 4 mmol/L — ABNORMAL HIGH (ref 0.0–2.0)
Acid-base deficit: 1 mmol/L (ref 0.0–2.0)
Acid-base deficit: 4 mmol/L — ABNORMAL HIGH (ref 0.0–2.0)
Bicarbonate: 22.1 mEq/L (ref 20.0–24.0)
Bicarbonate: 23.1 mEq/L (ref 20.0–24.0)
Bicarbonate: 28.7 mEq/L — ABNORMAL HIGH (ref 20.0–24.0)
O2 Saturation: 100 %
O2 Saturation: 97 %
Patient temperature: 94
Patient temperature: 99.2
TCO2: 26 mmol/L (ref 0–100)
TCO2: 28 mmol/L (ref 0–100)
pCO2 arterial: 37.1 mmHg (ref 35.0–45.0)
pCO2 arterial: 41 mmHg (ref 35.0–45.0)
pCO2 arterial: 41.3 mmHg (ref 35.0–45.0)
pCO2 arterial: 45.7 mmHg — ABNORMAL HIGH (ref 35.0–45.0)
pH, Arterial: 7.294 — ABNORMAL LOW (ref 7.350–7.450)
pH, Arterial: 7.419 (ref 7.350–7.450)
pH, Arterial: 7.437 (ref 7.350–7.450)
pH, Arterial: 7.48 — ABNORMAL HIGH (ref 7.350–7.450)
pO2, Arterial: 237 mmHg — ABNORMAL HIGH (ref 80.0–100.0)
pO2, Arterial: 489 mmHg — ABNORMAL HIGH (ref 80.0–100.0)

## 2010-08-19 LAB — CBC
HCT: 20.9 % — ABNORMAL LOW (ref 39.0–52.0)
HCT: 30.7 % — ABNORMAL LOW (ref 39.0–52.0)
HCT: 31.9 % — ABNORMAL LOW (ref 39.0–52.0)
HCT: 35.2 % — ABNORMAL LOW (ref 39.0–52.0)
HCT: 46.6 % (ref 39.0–52.0)
Hemoglobin: 10.7 g/dL — ABNORMAL LOW (ref 13.0–17.0)
Hemoglobin: 11 g/dL — ABNORMAL LOW (ref 13.0–17.0)
Hemoglobin: 11.2 g/dL — ABNORMAL LOW (ref 13.0–17.0)
Hemoglobin: 12.2 g/dL — ABNORMAL LOW (ref 13.0–17.0)
Hemoglobin: 12.5 g/dL — ABNORMAL LOW (ref 13.0–17.0)
Hemoglobin: 16.5 g/dL (ref 13.0–17.0)
Hemoglobin: 16.9 g/dL (ref 13.0–17.0)
MCHC: 34.7 g/dL (ref 30.0–36.0)
MCHC: 34.7 g/dL (ref 30.0–36.0)
MCHC: 35.2 g/dL (ref 30.0–36.0)
MCHC: 35.8 g/dL (ref 30.0–36.0)
MCHC: 35.9 g/dL (ref 30.0–36.0)
MCHC: 35.9 g/dL (ref 30.0–36.0)
MCV: 90 fL (ref 78.0–100.0)
MCV: 90.6 fL (ref 78.0–100.0)
Platelets: 107 10*3/uL — ABNORMAL LOW (ref 150–400)
Platelets: 116 10*3/uL — ABNORMAL LOW (ref 150–400)
Platelets: 93 10*3/uL — ABNORMAL LOW (ref 150–400)
Platelets: 171 10*3/uL (ref 150–400)
RBC: 2.31 MIL/uL — ABNORMAL LOW (ref 4.22–5.81)
RBC: 3.45 MIL/uL — ABNORMAL LOW (ref 4.22–5.81)
RBC: 3.83 MIL/uL — ABNORMAL LOW (ref 4.22–5.81)
RBC: 3.88 MIL/uL — ABNORMAL LOW (ref 4.22–5.81)
RBC: 5.06 MIL/uL (ref 4.22–5.81)
RBC: 5.27 MIL/uL (ref 4.22–5.81)
RDW: 13.7 % (ref 11.5–15.5)
RDW: 14.4 % (ref 11.5–15.5)
RDW: 14.7 % (ref 11.5–15.5)
RDW: 13.1 % (ref 11.5–15.5)
WBC: 14.2 10*3/uL — ABNORMAL HIGH (ref 4.0–10.5)
WBC: 9.4 10*3/uL (ref 4.0–10.5)
WBC: 10.2 10*3/uL (ref 4.0–10.5)
WBC: 9.3 10*3/uL (ref 4.0–10.5)

## 2010-08-19 LAB — POCT I-STAT, CHEM 8
BUN: 9 mg/dL (ref 6–23)
Calcium, Ion: 1.24 mmol/L (ref 1.12–1.32)
Creatinine, Ser: 0.8 mg/dL (ref 0.4–1.5)
Glucose, Bld: 169 mg/dL — ABNORMAL HIGH (ref 70–99)
TCO2: 21 mmol/L (ref 0–100)

## 2010-08-19 LAB — POCT I-STAT 4, (NA,K, GLUC, HGB,HCT)
Glucose, Bld: 102 mg/dL — ABNORMAL HIGH (ref 70–99)
Glucose, Bld: 117 mg/dL — ABNORMAL HIGH (ref 70–99)
Glucose, Bld: 163 mg/dL — ABNORMAL HIGH (ref 70–99)
Glucose, Bld: 98 mg/dL (ref 70–99)
HCT: 21 % — ABNORMAL LOW (ref 39.0–52.0)
HCT: 25 % — ABNORMAL LOW (ref 39.0–52.0)
HCT: 25 % — ABNORMAL LOW (ref 39.0–52.0)
HCT: 26 % — ABNORMAL LOW (ref 39.0–52.0)
HCT: 26 % — ABNORMAL LOW (ref 39.0–52.0)
Hemoglobin: 12.9 g/dL — ABNORMAL LOW (ref 13.0–17.0)
Hemoglobin: 7.1 g/dL — ABNORMAL LOW (ref 13.0–17.0)
Hemoglobin: 8.5 g/dL — ABNORMAL LOW (ref 13.0–17.0)
Hemoglobin: 8.8 g/dL — ABNORMAL LOW (ref 13.0–17.0)
Hemoglobin: 8.8 g/dL — ABNORMAL LOW (ref 13.0–17.0)
Potassium: 2.9 mEq/L — ABNORMAL LOW (ref 3.5–5.1)
Potassium: 3.2 mEq/L — ABNORMAL LOW (ref 3.5–5.1)
Potassium: 4.4 mEq/L (ref 3.5–5.1)
Sodium: 133 mEq/L — ABNORMAL LOW (ref 135–145)
Sodium: 135 mEq/L (ref 135–145)
Sodium: 139 mEq/L (ref 135–145)

## 2010-08-19 LAB — URINALYSIS, ROUTINE W REFLEX MICROSCOPIC
Glucose, UA: 500 mg/dL — AB
Hgb urine dipstick: NEGATIVE
Ketones, ur: NEGATIVE mg/dL
Nitrite: NEGATIVE
Protein, ur: NEGATIVE mg/dL
Specific Gravity, Urine: 1.013 (ref 1.005–1.030)
pH: 6.5 (ref 5.0–8.0)

## 2010-08-19 LAB — POCT I-STAT 3, VENOUS BLOOD GAS (G3P V)
Bicarbonate: 22.1 mEq/L (ref 20.0–24.0)
TCO2: 23 mmol/L (ref 0–100)
pCO2, Ven: 35.2 mmHg — ABNORMAL LOW (ref 45.0–50.0)
pH, Ven: 7.405 — ABNORMAL HIGH (ref 7.250–7.300)
pO2, Ven: 62 mmHg — ABNORMAL HIGH (ref 30.0–45.0)

## 2010-08-19 LAB — COMPREHENSIVE METABOLIC PANEL
ALT: 17 U/L (ref 0–53)
Albumin: 4.1 g/dL (ref 3.5–5.2)
Calcium: 10.2 mg/dL (ref 8.4–10.5)
Chloride: 100 mEq/L (ref 96–112)
Creatinine, Ser: 0.68 mg/dL (ref 0.4–1.5)
Potassium: 4.1 mEq/L (ref 3.5–5.1)
Total Bilirubin: 0.9 mg/dL (ref 0.3–1.2)

## 2010-08-19 LAB — PLATELET COUNT: Platelets: 141 10*3/uL — ABNORMAL LOW (ref 150–400)

## 2010-08-19 LAB — TYPE AND SCREEN: ABO/RH(D): O POS

## 2010-08-19 LAB — HEMOGLOBIN A1C: Hgb A1c MFr Bld: 6 % — ABNORMAL HIGH (ref <5.7)

## 2010-08-19 LAB — POCT CARDIAC MARKERS
CKMB, poc: 1.5 ng/mL (ref 1.0–8.0)
Myoglobin, poc: 56.6 ng/mL (ref 12–200)

## 2010-08-19 LAB — BASIC METABOLIC PANEL
BUN: 15 mg/dL (ref 6–23)
BUN: 8 mg/dL (ref 6–23)
BUN: 15 mg/dL (ref 6–23)
CO2: 26 mEq/L (ref 19–32)
CO2: 27 mEq/L (ref 19–32)
CO2: 28 mEq/L (ref 19–32)
Calcium: 8.6 mg/dL (ref 8.4–10.5)
Calcium: 10.1 mg/dL (ref 8.4–10.5)
Calcium: 9.5 mg/dL (ref 8.4–10.5)
Chloride: 111 mEq/L (ref 96–112)
Creatinine, Ser: 0.74 mg/dL (ref 0.4–1.5)
GFR calc Af Amer: >60 mL/min (ref >60)
GFR calc non Af Amer: >60 mL/min (ref >60)
GFR calc non Af Amer: >60 mL/min (ref >60)
GFR calc non Af Amer: >60 mL/min (ref >60)
Glucose, Bld: 117 mg/dL — ABNORMAL HIGH (ref 70–99)
Glucose, Bld: 171 mg/dL — ABNORMAL HIGH (ref 70–99)
Glucose, Bld: 152 mg/dL — ABNORMAL HIGH (ref 70–99)
Potassium: 3.6 mEq/L (ref 3.5–5.1)
Potassium: 3.9 mEq/L (ref 3.5–5.1)
Sodium: 136 mEq/L (ref 135–145)
Sodium: 137 mEq/L (ref 135–145)
Sodium: 136 mEq/L (ref 135–145)

## 2010-08-19 LAB — ABO/RH: ABO/RH(D): O POS

## 2010-08-19 LAB — DIFFERENTIAL
Basophils Relative: 1 % (ref 0–1)
Lymphocytes Relative: 17 % (ref 12–46)
Lymphs Abs: 1.7 10*3/uL (ref 0.7–4.0)
Monocytes Absolute: 0.7 10*3/uL (ref 0.1–1.0)
Monocytes Relative: 7 % (ref 3–12)
Neutro Abs: 7.4 10*3/uL (ref 1.7–7.7)
Neutrophils Relative %: 73 % (ref 43–77)

## 2010-08-19 LAB — APTT
aPTT: 35 seconds (ref 24–37)
aPTT: 30 seconds (ref 24–37)

## 2010-08-19 LAB — CK TOTAL AND CKMB (NOT AT ARMC)
CK, MB: 2.5 ng/mL (ref 0.3–4.0)
Total CK: 75 U/L (ref 7–232)

## 2010-08-19 LAB — CREATININE, SERUM
Creatinine, Ser: 0.91 mg/dL (ref 0.4–1.5)
Creatinine, Ser: 0.95 mg/dL (ref 0.4–1.5)
GFR calc Af Amer: >60 mL/min (ref >60)
GFR calc non Af Amer: >60 mL/min (ref >60)
GFR calc non Af Amer: >60 mL/min (ref >60)

## 2010-08-19 LAB — BLOOD GAS, ARTERIAL
Acid-Base Excess: 3.2 mmol/L — ABNORMAL HIGH (ref 0.0–2.0)
Drawn by: 206361
O2 Saturation: 97.9 %
Patient temperature: 98.6
TCO2: 28.7 mmol/L (ref 0–100)

## 2010-08-19 LAB — PREPARE FRESH FROZEN PLASMA

## 2010-08-19 LAB — URINE CULTURE
Colony Count: NO GROWTH
Culture: NO GROWTH

## 2010-08-19 LAB — PREPARE PLATELETS

## 2010-08-19 LAB — LIPID PANEL
Cholesterol: 140 mg/dL (ref 0–200)
LDL Cholesterol: 64 mg/dL (ref 0–99)
Total CHOL/HDL Ratio: 2.8 RATIO
Triglycerides: 130 mg/dL (ref <150)

## 2010-08-19 LAB — HEMOGLOBIN AND HEMATOCRIT, BLOOD
HCT: 30 % — ABNORMAL LOW (ref 39.0–52.0)
Hemoglobin: 10.8 g/dL — ABNORMAL LOW (ref 13.0–17.0)

## 2010-08-19 LAB — SURGICAL PCR SCREEN: Staphylococcus aureus: NEGATIVE

## 2010-08-19 LAB — PREPARE RBC (CROSSMATCH)

## 2010-08-19 LAB — TROPONIN I: Troponin I: <0.01 ng/mL (ref 0.00–0.06)

## 2010-10-14 NOTE — Assessment & Plan Note (Signed)
OFFICE VISIT   Guy Roberson, PLAIN  DOB:  1932/01/04                                        November 04, 2009  CHART #:  [id number]   HISTORY OF PRESENT ILLNESS:  This is a 77 year old Caucasian male with a  known medical history of coronary artery disease, hypertension,  hyperlipidemia, and type 2 diabetes mellitus.  The patient underwent a  CABG x4 on Oct 10, 2009.  He had a fairly uneventful hospital course  stay with the exception of urinary retention, for which he required  Foley reinsertion by an urologist.  Foley was removed; however, he was  unable to void and required Foley reinsertion again and presents to the  office today with a Foley, and he is going to have an ultrasound as well  as further follow up with Dr. Vernie Ammons.  The patient is walking a couple  of times a day without difficulty.  Denies any shortness of breath or  chest pain.   PHYSICAL EXAMINATION:  General:  This is a pleasant 77 year old  Caucasian male who is accompanied by his wife who is in no acute  distress who is alert, oriented, and cooperative.  Vital Signs:  BP  146/73, heart rate 76, respirations 18, O2 sat 95% on room air.  Cardiovascular:  Regular rate and rhythm.  No murmurs, gallops, or rubs.  Pulmonary:  Clear to auscultation bilaterally.  decreased breath sounds  at the left base.  Abdomen:  Soft, nontender.  Bowel sounds present.  Chest:  Sternum is solid.  There is a scab at that was removed in the  inferior sternal wound.  No sign of wound infection.  Extremities:  Right lower extremity wound is clean, dry, well healed.  Again, no sign  of infection.  There is slight edema of the right lower extremity in  comparison to the left.   DIAGNOSTIC TESTS:  Chest x-ray done today shows a small-to-moderate left-  sided pleural effusion (increased from previous study), resolved  previous right pleural lesion.   IMPRESSION AND PLAN:  Overall, the patient has continued to make  steady  progress status post coronary artery bypass graft x4.  There have been  no changes made to his medications.  He has already seen Dr. Dietrich Pates in  [place] in follow up and will continued to be followed by him  closely.  Regarding the patient's left pleural effusion, per Dr. Cornelius Moras he  is going to return for follow up with a chest x-ray in 8 weeks.  He is  to contact the office if he has any shortness of breath or lower  extremity edema in the interim.  The patient instructed he may begin  driving short distances 30 minutes or less during the day and gradually  increase his frequency and duration over the next couple of weeks.  He  is also instructed to continue with his sternal precautions, i.e., no  lifting more than 10 pounds for 2-4 more weeks.  He was also encouraged  to participate in cardiac rehab once his urinary retention issues have  been resolved.  As previously stated, the patient is going to follow up  with Dr. Vernie Ammons at his discretion regarding urinary retention.   Salvatore Decent. Cornelius Moras, M.D.  Electronically Signed   DZ/MEDQ  D:  11/04/2009  T:  11/05/2009  Job:  [id number]   cc:   Loraine Leriche C. Vernie Ammons, M.D.  Mila Homer. Sudie Bailey, M.D.

## 2010-10-14 NOTE — Assessment & Plan Note (Signed)
OFFICE VISIT   Guy Roberson, Guy Roberson  DOB:  12-17-1931                                        December 30, 2009  CHART #:  [id number]   HISTORY:  The patient returns to the office today for further followup  status post coronary artery bypass grafting x4 on Oct 10, 2009.  He was  last seen here in the office, November 04, 2009, at which time he was  recovering fairly well.  His problems with urinary retention and bladder  outlet obstruction have resolved and he is now back to essentially  normal physical activity.  He has never had any significant problems  with pains in his chest.  He has no shortness of breath.  He is enjoying  fairly normal activity and he has no complaints overall.  The remainder  of his review of systems is unremarkable.   PHYSICAL EXAMINATION:  Notable for well-appearing male with blood  pressure 146/62, pulse 50, and oxygen saturation 99% on room air.  Examination of the chest reveals a median sternotomy scar that is  healing nicely.  The sternum is stable on palpation.  Breath sounds are  clear to auscultation and symmetrical bilaterally.  No wheezes, rales,  or rhonchi are noted.  Cardiovascular exam includes regular rate and  rhythm.  No murmurs, rubs, or gallops are appreciated.  The abdomen is  soft and nontender.  The extremities are warm and well perfused.  There  is no lower extremity edema.   DIAGNOSTIC TESTS:  Chest [facility]-ray performed [facility] at the [facility] is reviewed.  This demonstrates clear lung fields bilaterally.  The previous left pleural effusion has resolved completely.  No other  abnormalities are noted.   IMPRESSION:  Excellent progress following coronary artery bypass  grafting.   PLAN:  The patient may return to normal activity without any particular  limitations at this time.  All of his questions have been addressed.  In  the future, he will call and return to see [place] as needed.   Salvatore Decent. Cornelius Moras, M.D.  Electronically Signed   CHO/MEDQ  D:  12/30/2009  T:  12/31/2009  Job:  [id number]   cc:   Gerrit Friends. Dietrich Pates, MD, [facility]  Verne Carrow, MD  Mila Homer. Sudie Bailey, M.D.  Veverly Fells. Vernie Ammons, M.D.

## 2010-10-14 NOTE — Assessment & Plan Note (Signed)
OFFICE VISIT   Guy Roberson, Guy Roberson  DOB:  12/03/1931                                        Oct 07, 2009  CHART #:  [id number]   The patient returns for followup with tentative plans to proceed with  elective coronary artery bypass grafting on Thursday, Oct 10, 2009.  He  was originally seen in consultation on September 27, 2009, and a full  history and physical exam was dictated at that time.  Since then, the  patient has done well.  He specifically reports that he has had  absolutely no episodes of chest discomfort.  Has been careful not to  exert himself at home.  He otherwise feels fine and is eager to proceed  with surgery.  He has no new complaints.  The remainder of his review of  systems is unremarkable.  His physical exam is unrevealing and unchanged  from previously.  I spent in excess of 20 minutes discussing the  indications, risks, and potential benefits of surgery.  All of his  questions have been addressed.   Salvatore Decent. Cornelius Moras, M.D.  Electronically Signed   CHO/MEDQ  D:  10/07/2009  T:  10/08/2009  Job:  [id number]

## 2010-10-17 NOTE — Op Note (Signed)
NAME:  Guy, Roberson                           ACCOUNT NO.:  [id number]   MEDICAL RECORD NO.:  [id number]                   PATIENT TYPE:  AMB   LOCATION:  DAY                                  FACILITY:  [facility]   PHYSICIAN:  Lionel December, M.D.                 DATE OF BIRTH:  06/24/1931   DATE OF PROCEDURE:  07/05/2003  DATE OF DISCHARGE:                                 OPERATIVE REPORT   PROCEDURE:  Total colonoscopy with polypectomy.   INDICATIONS FOR PROCEDURE:  Guy Roberson is a 77 year old Caucasian male who is  undergoing colonoscopy primarily for screening purposes. Family history is  significant for colon carcinoma. He lost one brother at age 54.  His last  colonoscopy was in 1990.  He had a tiny polyp in the area of the ascending  colon but this could not be removed. He presently does not have any GI  symptoms. The procedure is reviewed with the patient and informed consent  was obtained.   PREOP MEDICATIONS:  Demerol 25 mg IV, Versed 4 mg IV.   FINDINGS:  Procedure performed in endoscopy suite. The patient's vital signs  and O2 saturations were monitored during the procedure and remained stable.  The patient was placed in the left lateral decubitus position, rectal  examination performed. No abnormalities noted on external or digital exam.  The scope was placed in the rectum and advanced under direct vision to the  sigmoid colon and beyond. The prep was excellent.  He had scattered  diverticula throughout his colon but most of these were at sigmoid, hepatic  flexure and ascending colon, some of these were quite large.  The proximal  colon was redundant. The scope was passed through the cecum which was  identified by the appendiceal orifice and the ileocecal valve.  As the scope  was withdrawn, the colonic mucosa was carefully examined. There was no polyp  in the ascending colon but there was a small polyp at the proximal  transverse colon which was ablated via cold biopsy. There  were two small  bilobed and then trilobed polyps of the descending colon, both of these were  snared.  One was coagulated during this process; however, tissue was  obtained from it. The other polyp was about 5-6 mm and this was also snared  and retrieved for histologic examination.  The mucosa of the rest of the  colon was normal. The rectal mucosa similarly was normal. The scope was  retroflexed to examine the anorectal junction and small hemorrhoids were  noted below the dentate line. The endoscope was straightened and withdrawn.  The patient tolerated the procedure well.   FINAL DIAGNOSES:  1. Pan colonic diverticulosis.  2. Three small polyps, two from the descending colon were snared and the     third one at transverse colon was ablated via cold biopsy.  3. Small external hemorrhoids.  4. High  fiber diet. He will continue Metamucil one tablespoon daily.  5. No ASA for seven days.  I will be contacting the patient with biopsy     results.  Given today's findings and his family history, I feel he should     return for next colonoscopy in five years from now.      ___________________________________________                                            Lionel December, M.D.   NR/MEDQ  D:  07/05/2003  T:  07/05/2003  Job:  [id number]   cc:   Mila Homer. Sudie Bailey, M.D.  [address] [place], [place] [address]  Fax: [phone]

## 2011-04-13 ENCOUNTER — Telehealth: Payer: Self-pay | Admitting: Cardiology

## 2011-04-13 ENCOUNTER — Ambulatory Visit: Payer: Self-pay | Admitting: Adult Health

## 2011-04-13 NOTE — Telephone Encounter (Signed)
Patient states that his BP has been running high.  Top number being in the 200's. Stated that he called Dr.Knowlton's office but they are closed due to the [place]. Wants to know if he can be seen today.  Patient is past due for f/u was due back for f/u in 9/12. / tg

## 2011-04-13 NOTE — Telephone Encounter (Signed)
Patient is going to see Dr Sudie Bailey at 11 for this issue.

## 2011-05-01 ENCOUNTER — Encounter: Payer: Self-pay | Admitting: Cardiology

## 2011-05-01 ENCOUNTER — Ambulatory Visit (INDEPENDENT_AMBULATORY_CARE_PROVIDER_SITE_OTHER): Payer: Medicare Other | Admitting: Cardiology

## 2011-05-01 DIAGNOSIS — E119 Type 2 diabetes mellitus without complications: Secondary | ICD-10-CM

## 2011-05-01 DIAGNOSIS — I2510 Atherosclerotic heart disease of native coronary artery without angina pectoris: Secondary | ICD-10-CM

## 2011-05-01 DIAGNOSIS — I10 Essential (primary) hypertension: Secondary | ICD-10-CM

## 2011-05-01 DIAGNOSIS — K635 Polyp of colon: Secondary | ICD-10-CM | POA: Insufficient documentation

## 2011-05-01 DIAGNOSIS — R7301 Impaired fasting glucose: Secondary | ICD-10-CM | POA: Insufficient documentation

## 2011-05-01 DIAGNOSIS — E785 Hyperlipidemia, unspecified: Secondary | ICD-10-CM

## 2011-05-01 NOTE — Progress Notes (Signed)
Patient ID: Guy Roberson, male   DOB: Nov 29, 1931, 77 y.o.   MRN: [id number] HPI: Return visit for this very nice gentleman with coronary disease, now nearly 2 years post CABG surgery.  He denies all cardiopulmonary symptoms despite maintaining a fairly good level of activity.  Has developed no new medical problems, has not required care in the emergency department nor hospitalization.  Blood pressure control has been somewhat suboptimal, but continues to improve as his medical regime has been adjusted.  On one occasion, a reading of 200 systolic was obtained.  Direct comparison of his device with a sphygmomanometer and Dr. Michelle Nasuti office indicated a 20 mmHg error with history of blood pressure being closer to 180.  He is unaware of recent laboratory studies.  Prior to Admission medications   Medication Sig Start Date End Date Taking? Authorizing Provider  aspirin 81 MG tablet Take 81 mg by mouth 2 (two) times daily.     Yes Historical Provider, MD  Cyanocobalamin (VITAMIN B 12) 100 MCG LOZG Take 1,000 mg by mouth daily.     Yes Historical Provider, MD  hydrochlorothiazide (HYDRODIURIL) 25 MG tablet Take 25 mg by mouth daily.     Yes Historical Provider, MD  lisinopril (PRINIVIL,ZESTRIL) 20 MG tablet Take 20 mg by mouth 2 (two) times daily.     Yes Historical Provider, MD  metoprolol (LOPRESSOR) 100 MG tablet Take 100 mg by mouth 2 (two) times daily.     Yes Historical Provider, MD  Saw Palmetto 450 MG CAPS Take 450 mg by mouth daily.     Yes Historical Provider, MD  simvastatin (ZOCOR) 20 MG tablet Take 20 mg by mouth at bedtime.     Yes Historical Provider, MD  temazepam (RESTORIL) 30 MG capsule Take 30 mg by mouth at bedtime as needed.     Yes Historical Provider, MD    Allergies  Allergen Reactions  . Penicillins       Past medical history, social history, and family history reviewed and updated.  ROS: Denies orthopnea, PND, dyspnea on exertion, chest pain, palpitations, lightheadedness  or syncope.  PHYSICAL EXAM: BP 149/61  Pulse 50  Ht 5\' 7"  (1.702 m)  Wt 72.576 kg (160 lb)  BMI 25.06 kg/m2  SpO2 98%   General-Well developed; no acute distress Body habitus-proportionate weight and height Neck-No JVD; no carotid bruits Lungs-clear lung fields; resonant to percussion Cardiovascular-normal PMI; normal S1 and S2 Abdomen-normal bowel sounds; soft and non-tender without masses or organomegaly Musculoskeletal-No deformities, no cyanosis or clubbing Neurologic-Normal cranial nerves; symmetric strength and tone Skin-Warm, no significant lesions Extremities-1-2+distal pulses; trace edema; erythema over the feet with some skin atrophy  ASSESSMENT AND PLAN:  [place] Bing, MD 05/01/2011 7:07 PM

## 2011-05-01 NOTE — Assessment & Plan Note (Addendum)
Close attention from Dr. Sudie Bailey has resulted in nearly optimal blood pressure.  I suspect that systolics are closer to 150 now at home.  He would benefit from adjustment of his antihypertensive medication.  Lisinopril can be increased to 40 mg per day, but that might not provide adequate lowering of systolic BP.  Accordingly, the addition of amlodipine might be a better choice.  I will leave that decision to be addressed at the patient's next office visit with Dr. Sudie Bailey.

## 2011-05-01 NOTE — Patient Instructions (Signed)
Your physician recommends that you schedule a follow-up appointment in: 1 year    Your physician recommends that you continue on your current medications as directed. Please refer to the Current Medication list given to you today.  

## 2011-05-01 NOTE — Assessment & Plan Note (Addendum)
Patient is doing very well from a cardiovascular standpoint with no symptoms to suggest recurrent myocardial ischemia.  Our efforts will focus on optimal control of cardiovascular risk factors.

## 2011-05-01 NOTE — Assessment & Plan Note (Signed)
No recent assessment of serum lipids is available.  Records of prior laboratory testing had been requested.

## 2011-05-03 IMAGING — CR DG CHEST 2V
2 series · 2 of 2 positions shown · non-contrast
Comparison: Head 10/11/2009

CLINICAL DATA: CAD.  CABG.

CHEST - 2 VIEW

[w chest pa]
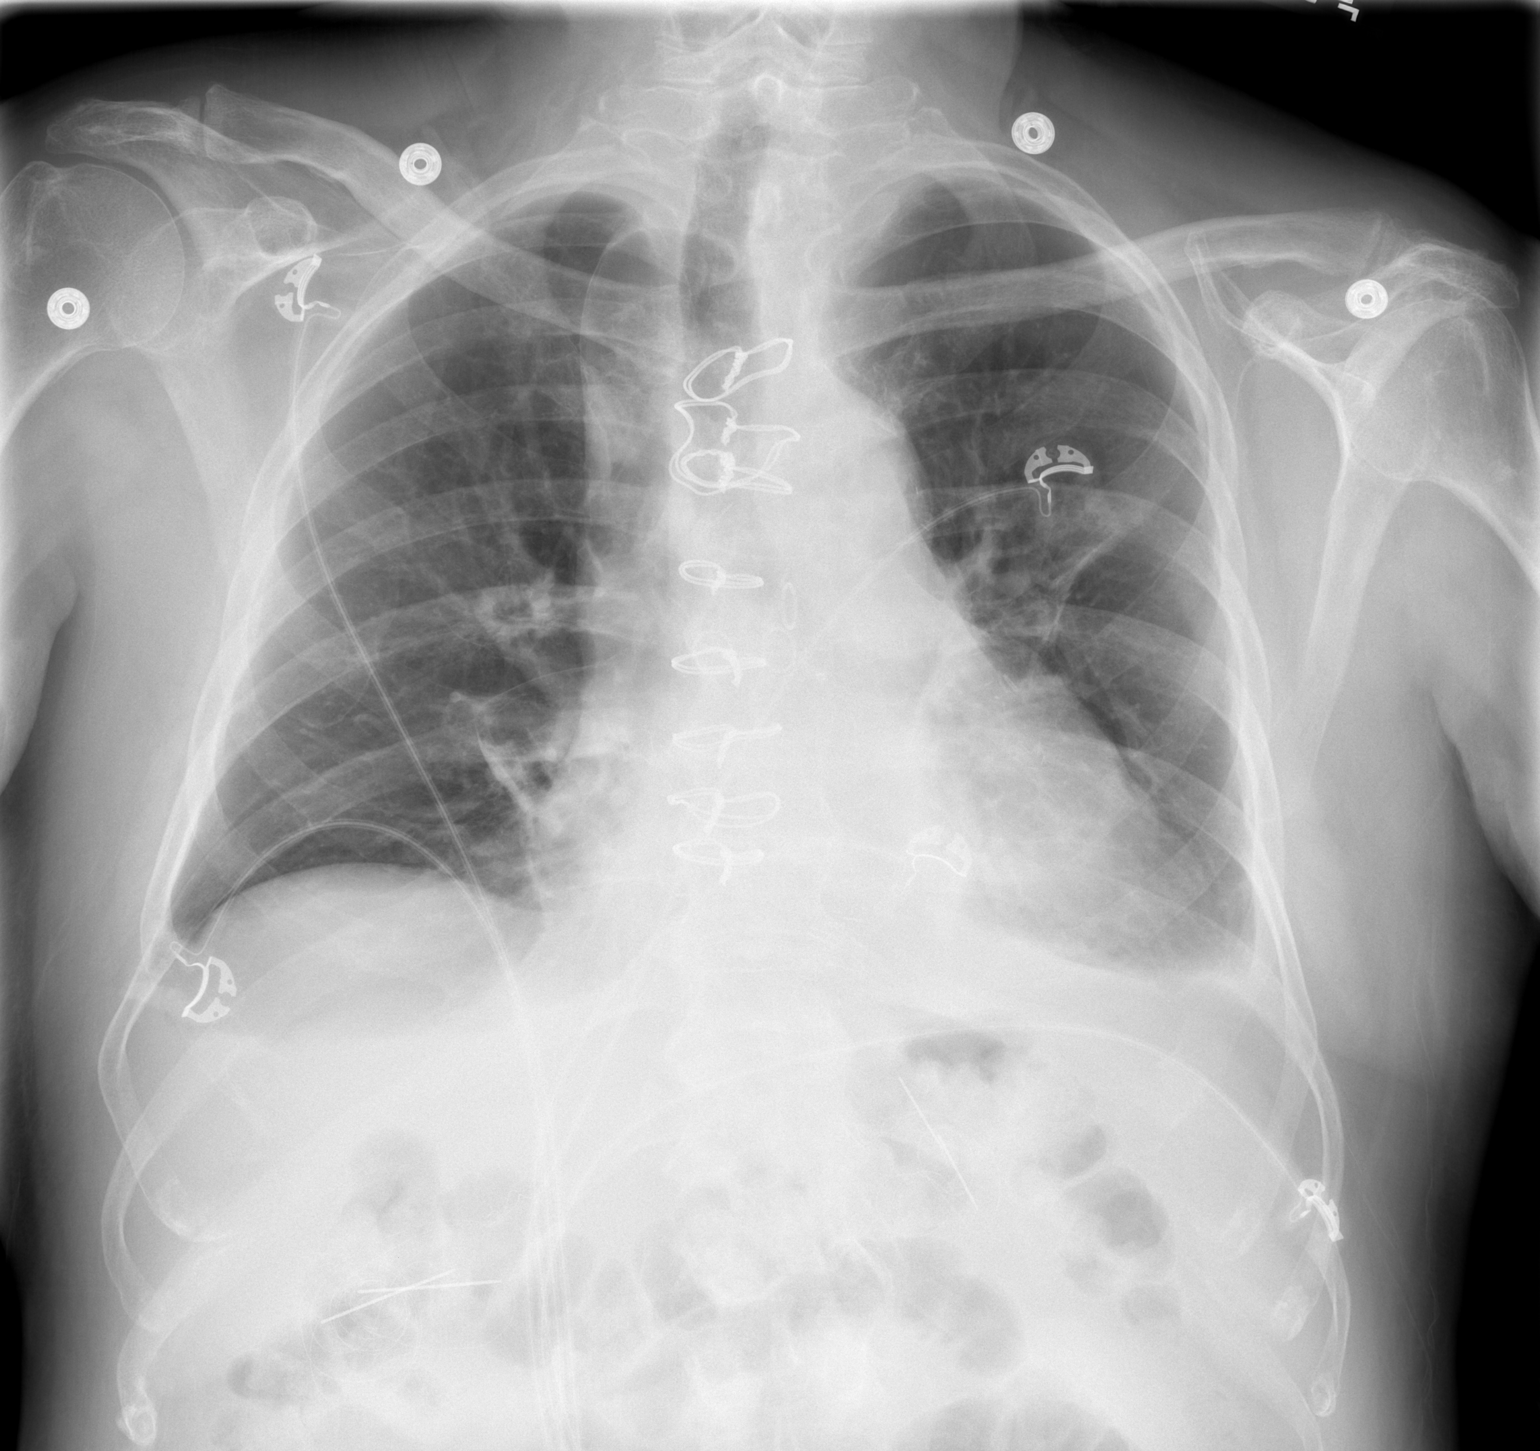

[w chest lat]
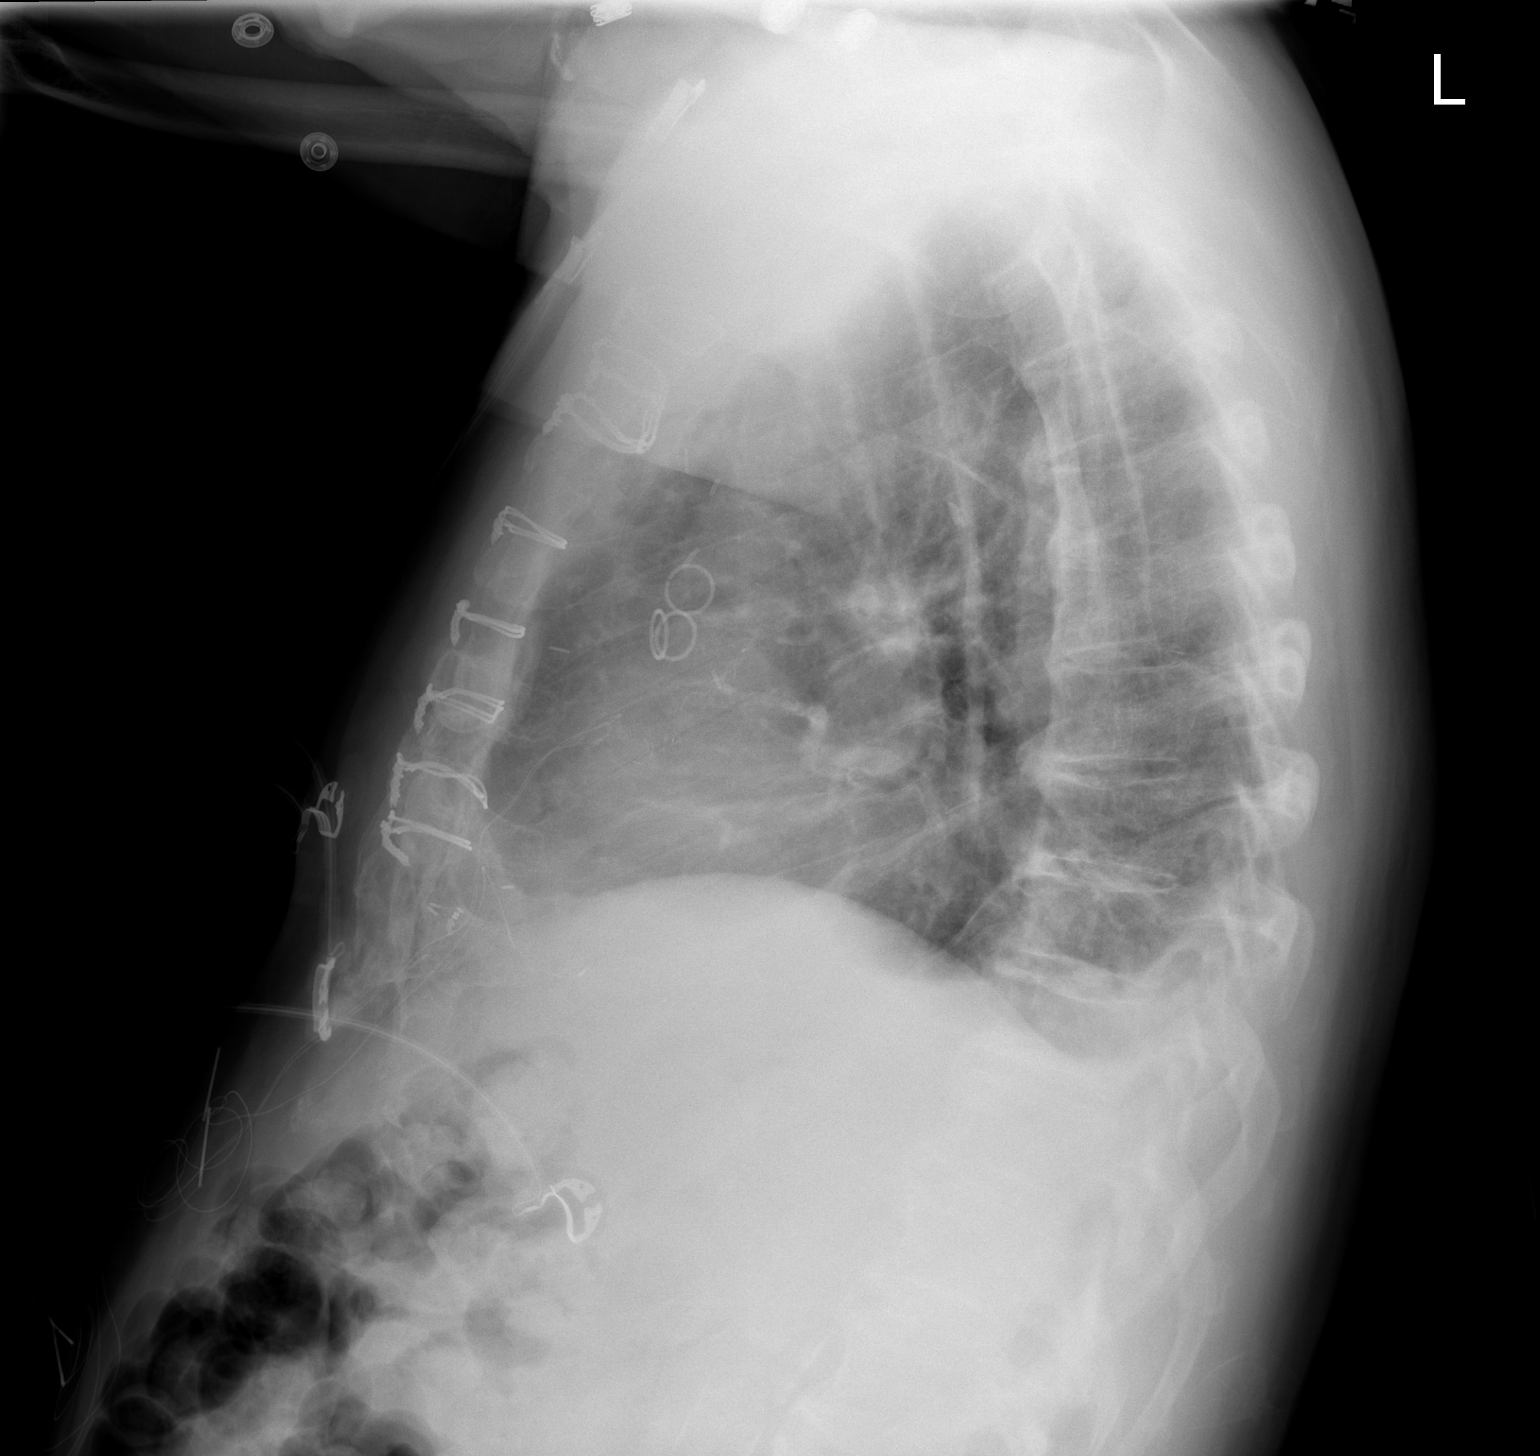

[2 of 2 positions shown; findings below may reference images not displayed]

FINDINGS: There are changes of median sternotomy for CABG.
Aeration of the right lung base is improving.  Mild left basilar
atelectasis and very small left pleural effusion.  Negative for
edema. No pneumothorax is identified, status post removal of left
chest tube.  Interval removal of right IJ sheath and Swan-Ganz
catheter and mediastinal drain. Azygos lobe noted.  Epicardial
pacing wires present.
IMPRESSION: 1.  No visible pneumothorax, status post left chest tube removal.
2.  Very small residual left pleural effusion and mild left basilar
atelectasis, with improving aeration at the right lung base.

## 2011-07-03 DEATH — deceased

## 2011-08-14 ENCOUNTER — Encounter (HOSPITAL_COMMUNITY): Payer: Self-pay

## 2011-08-14 ENCOUNTER — Emergency Department (HOSPITAL_COMMUNITY)
Admission: EM | Admit: 2011-08-14 | Discharge: 2011-08-15 | Disposition: A | Discharge status: Home / Self Care | Payer: Medicare Other | Attending: Emergency Medicine | Admitting: Emergency Medicine

## 2011-08-14 DIAGNOSIS — R42 Dizziness and giddiness: Secondary | ICD-10-CM | POA: Insufficient documentation

## 2011-08-14 DIAGNOSIS — I10 Essential (primary) hypertension: Secondary | ICD-10-CM | POA: Insufficient documentation

## 2011-08-14 DIAGNOSIS — R259 Unspecified abnormal involuntary movements: Secondary | ICD-10-CM | POA: Insufficient documentation

## 2011-08-14 DIAGNOSIS — M62838 Other muscle spasm: Secondary | ICD-10-CM | POA: Insufficient documentation

## 2011-08-14 DIAGNOSIS — E785 Hyperlipidemia, unspecified: Secondary | ICD-10-CM | POA: Insufficient documentation

## 2011-08-14 DIAGNOSIS — Z7982 Long term (current) use of aspirin: Secondary | ICD-10-CM | POA: Insufficient documentation

## 2011-08-14 DIAGNOSIS — I2510 Atherosclerotic heart disease of native coronary artery without angina pectoris: Secondary | ICD-10-CM | POA: Insufficient documentation

## 2011-08-14 DIAGNOSIS — Z79899 Other long term (current) drug therapy: Secondary | ICD-10-CM | POA: Insufficient documentation

## 2011-08-14 NOTE — ED Notes (Signed)
Pt states he was watching tv and had an episode where he had spasms in his legs, lasted less than a minute and then he was back to normal, denies complaints at this time

## 2011-08-15 ENCOUNTER — Other Ambulatory Visit: Payer: Self-pay

## 2011-08-15 ENCOUNTER — Emergency Department (HOSPITAL_COMMUNITY): Payer: Medicare Other

## 2011-08-15 LAB — COMPREHENSIVE METABOLIC PANEL
Albumin: 4.1 g/dL (ref 3.5–5.2)
BUN: 26 mg/dL — ABNORMAL HIGH (ref 6–23)
Calcium: 10.5 mg/dL (ref 8.4–10.5)
Creatinine, Ser: 0.74 mg/dL (ref 0.50–1.35)
GFR calc Af Amer: >90 mL/min (ref >90)
Total Protein: 7.4 g/dL (ref 6.0–8.3)

## 2011-08-15 LAB — DIFFERENTIAL
Basophils Relative: 0 % (ref 0–1)
Eosinophils Absolute: 0.3 10*3/uL (ref 0.0–0.7)
Eosinophils Relative: 4 % (ref 0–5)
Monocytes Absolute: 0.8 10*3/uL (ref 0.1–1.0)
Monocytes Relative: 10 % (ref 3–12)
Neutrophils Relative %: 60 % (ref 43–77)

## 2011-08-15 LAB — CBC
MCHC: 36.4 g/dL — ABNORMAL HIGH (ref 30.0–36.0)
Platelets: 148 10*3/uL — ABNORMAL LOW (ref 150–400)
RDW: 12.3 % (ref 11.5–15.5)

## 2011-08-15 LAB — TROPONIN I: Troponin I: <0.3 ng/mL (ref <0.30)

## 2011-08-15 MED ORDER — POTASSIUM CHLORIDE CRYS ER 20 MEQ PO TBCR
60.0000 meq | EXTENDED_RELEASE_TABLET | Freq: Once | ORAL | Status: AC
  Administered 2011-08-15: 60 meq via ORAL
  Filled 2011-08-15: qty 3

## 2011-08-15 NOTE — Discharge Instructions (Signed)
Your blood work showed a slightly low potassium which we have given you while you were here. Your chest xray and heart numbers were normal. If you have any further episodes of dizziness or shakiness, return to the ER or see your doctor.

## 2011-08-15 NOTE — ED Provider Notes (Signed)
History     CSN: [id number]  Arrival date & time 08/14/11  2318   First MD Initiated Contact with Patient 08/15/11 0011      Chief Complaint  Patient presents with  . Dizziness    (Consider location/radiation/quality/duration/timing/severity/associated sxs/prior treatment) HPI Guy Roberson is a 77 y.o. male who presents to the Emergency Department complaining of leg spasms while sitting in his chair associated with a mild lightheadedness.Both he and his wife fell asleep in their chairs watching TV when he was awakened by his wife and he noticed his legs were shaking. The irregular movement of his legs against the chair is what woke his wife. He had a momentary sensation of lightheadedness and he was back at his baseline. The leg spasms or movement lasted several seconds. He denies recent illness, difficulty with speech or swallowing,numbness, tingling, extremity weakness.  PCP Dr. Sudie Bailey  Past Medical History  Diagnosis Date  . Arteriosclerotic cardiovascular disease (ASCVD)     90% LAD stenosis in 1995 treated with PTCA; 3-vessel disease in 2011 requiring CABG; normal EF  . Hyperlipidemia   . Fasting hyperglycemia   . Hypertension   . Colonic polyp   . Trauma 1952    Abdominal and chest in 1952; multiple injuries including pelvic fracture, rib fractures and ruptured bladder    Past Surgical History  Procedure Date  . Femoral hernia repair   . Bladder surgery   . Lumbar spine surgery     +pelvis following trauma  . Coronary artery bypass graft 09/2009  . Colonoscopy w/ polypectomy     Family History  Problem Relation Age of Onset  . Coronary artery disease Brother     x3  . Lung cancer Brother   . Heart block Mother   . Heart murmur Mother   . Aneurysm Father     History  Substance Use Topics  . Smoking status: Never Smoker   . Smokeless tobacco: Current User  . Alcohol Use: No      Review of Systems  Constitutional: Negative for fever.       10  Systems reviewed and are negative for acute change except as noted in the HPI.  HENT: Negative for congestion.   Eyes: Negative for discharge and redness.  Respiratory: Negative for cough and shortness of breath.   Cardiovascular: Negative for chest pain.  Gastrointestinal: Negative for vomiting and abdominal pain.  Musculoskeletal: Negative for back pain.  Skin: Negative for rash.  Neurological: Negative for syncope, numbness and headaches.  Psychiatric/Behavioral:       No behavior change.    Allergies  Penicillins  Home Medications   Current Outpatient Rx  Name Route Sig Dispense Refill  . ASPIRIN 81 MG PO TABS Oral Take 81 mg by mouth 2 (two) times daily.      Marland Kitchen VITAMIN B 12 100 MCG PO LOZG Oral Take 1,000 mg by mouth daily.      Marland Kitchen HYDROCHLOROTHIAZIDE 25 MG PO TABS Oral Take 25 mg by mouth daily.      Marland Kitchen LISINOPRIL 20 MG PO TABS Oral Take 20 mg by mouth 2 (two) times daily.      Marland Kitchen METOPROLOL TARTRATE 100 MG PO TABS Oral Take 50 mg by mouth 2 (two) times daily.     Marland Kitchen SIMVASTATIN 20 MG PO TABS Oral Take 20 mg by mouth at bedtime.     Marland Kitchen TEMAZEPAM 30 MG PO CAPS Oral Take 30 mg by mouth at bedtime as needed.      Marland Kitchen  SAW PALMETTO 450 MG PO CAPS Oral Take 450 mg by mouth daily.        BP 151/47  Pulse 53  Temp(Src) 97.9 F (36.6 C) (Oral)  Resp 13  Ht 5\' 7"  (1.702 m)  Wt 160 lb (72.576 kg)  BMI 25.06 kg/m2  SpO2 94%  Physical Exam  Nursing note and vitals reviewed. Constitutional:       Awake, alert, nontoxic appearance with baseline speech for patient.  HENT:  Head: Atraumatic.  Mouth/Throat: No oropharyngeal exudate.  Eyes: EOM are normal. Pupils are equal, round, and reactive to light. Right eye exhibits no discharge. Left eye exhibits no discharge.  Neck: Neck supple.  Cardiovascular: Normal rate and regular rhythm.   No murmur heard. Pulmonary/Chest: Effort normal and breath sounds normal. No stridor. No respiratory distress. He has no wheezes. He has no rales. He  exhibits no tenderness.  Abdominal: Soft. Bowel sounds are normal. He exhibits no mass. There is no tenderness. There is no rebound.  Musculoskeletal: He exhibits no tenderness.       Baseline ROM, moves extremities with no obvious new focal weakness.  Lymphadenopathy:    He has no cervical adenopathy.  Neurological:       Awake, alert, cooperative and aware of situation; motor strength bilaterally; sensation normal to light touch bilaterally; peripheral visual fields full to confrontation; no facial asymmetry; tongue midline; major cranial nerves appear intact; no pronator drift, normal finger to nose bilaterally, baseline gait without new ataxia.  Skin: No rash noted.  Psychiatric: He has a normal mood and affect.    ED Course  Procedures (including critical care time) Results for orders placed during the hospital encounter of 08/14/11  CBC      Component Value Range   WBC 8.1  4.0 - 10.5 (K/uL)   RBC 5.20  4.22 - 5.81 (MIL/uL)   Hemoglobin 16.7  13.0 - 17.0 (g/dL)   HCT 16.1  09.6 - 04.5 (%)   MCV 88.3  78.0 - 100.0 (fL)   MCH 32.1  26.0 - 34.0 (pg)   MCHC 36.4 (*) 30.0 - 36.0 (g/dL)   RDW 40.9  81.1 - 91.4 (%)   Platelets 148 (*) 150 - 400 (K/uL)  DIFFERENTIAL      Component Value Range   Neutrophils Relative 60  43 - 77 (%)   Neutro Abs 4.8  1.7 - 7.7 (K/uL)   Lymphocytes Relative 26  12 - 46 (%)   Lymphs Abs 2.1  0.7 - 4.0 (K/uL)   Monocytes Relative 10  3 - 12 (%)   Monocytes Absolute 0.8  0.1 - 1.0 (K/uL)   Eosinophils Relative 4  0 - 5 (%)   Eosinophils Absolute 0.3  0.0 - 0.7 (K/uL)   Basophils Relative 0  0 - 1 (%)   Basophils Absolute 0.0  0.0 - 0.1 (K/uL)  COMPREHENSIVE METABOLIC PANEL      Component Value Range   Sodium 130 (*) 135 - 145 (mEq/L)   Potassium 3.3 (*) 3.5 - 5.1 (mEq/L)   Chloride 93 (*) 96 - 112 (mEq/L)   CO2 28  19 - 32 (mEq/L)   Glucose, Bld 190 (*) 70 - 99 (mg/dL)   BUN 26 (*) 6 - 23 (mg/dL)   Creatinine, Ser 7.82  0.50 - 1.35 (mg/dL)    Calcium 95.6  8.4 - 10.5 (mg/dL)   Total Protein 7.4  6.0 - 8.3 (g/dL)   Albumin 4.1  3.5 - 5.2 (g/dL)  AST 18  0 - 37 (U/L)   ALT 20  0 - 53 (U/L)   Alkaline Phosphatase 100  39 - 117 (U/L)   Total Bilirubin 0.3  0.3 - 1.2 (mg/dL)   GFR calc non Af Amer 87 (*) >90 (mL/min)   GFR calc Af Amer >90  >90 (mL/min)  TROPONIN I      Component Value Range   Troponin I <0.30  <0.30 (ng/mL)    Dg Chest Port 1 View  08/15/2011  *RADIOLOGY REPORT*  Clinical Data: Dizziness and shaking.  PORTABLE CHEST - 1 VIEW  Comparison: Chest radiograph performed 12/30/2009  Findings: The lungs are well-aerated and clear.  There is no evidence of focal opacification, pleural effusion or pneumothorax. An accessory azygos lobe is noted.  The cardiomediastinal silhouette is normal in size; the patient is status post median sternotomy, with evidence of prior CABG.  No acute osseous abnormalities are seen.  IMPRESSION: No acute cardiopulmonary process seen.  Original Report Authenticated By: Tonia Ghent, M.D.    #1  Date: 08/15/2011  0004  Rate:57  Rhythm: sinus bradycardia and with first degree block  QRS Axis: left  Intervals: normal  ST/T Wave abnormalities: normal  Conduction Disutrbances:first-degree A-V block , right bundle branch block and left anterior fascicular block  Narrative Interpretation:   Old EKG Reviewed: changes notedc/w 10/11/09, bifascicular block is new  #2  Date: 08/15/2011  0227  Rate: 51  Rhythm: sinus bradycardia and with first degree block  QRS Axis: left  Intervals: normal  ST/T Wave abnormalities: normal  Conduction Disutrbances:first-degree A-V block , right bundle branch block and left anterior fascicular block  Narrative Interpretation:   Old EKG Reviewed: unchanged    1. Dizziness        MDM   Patient with leg spasms and lightheadedness that lasted only several seconds while patient was in a half sleeping state. Labs are unremarkable except for a slightly low  potassium which was replaced. EKGs has changed c/w 10/11/09 as he now has a bivascicular block.  Patient ambulated without difficulty.Reviewed all results with patient and his wife. Pt stable in ED with no significant deterioration in condition.The patient appears reasonably screened and/or stabilized for discharge and I doubt any other medical condition or other [facility] requiring further screening, evaluation, or treatment in the ED at this time prior to discharge.  MDM Reviewed: nursing note and vitals Interpretation: labs, ECG and x-ray          Nicoletta Dress. Colon Branch, MD 08/15/11 [id number]

## 2011-08-18 ENCOUNTER — Other Ambulatory Visit (HOSPITAL_COMMUNITY): Payer: Self-pay | Admitting: Family Medicine

## 2011-08-18 DIAGNOSIS — R569 Unspecified convulsions: Secondary | ICD-10-CM

## 2011-08-19 ENCOUNTER — Encounter: Payer: Self-pay | Admitting: Adult Health

## 2011-08-19 ENCOUNTER — Ambulatory Visit (INDEPENDENT_AMBULATORY_CARE_PROVIDER_SITE_OTHER): Payer: Medicare Other | Admitting: Adult Health

## 2011-08-19 VITALS — BP 173/71 | HR 53 | Resp 16 | Ht 67.0 in | Wt 169.0 lb

## 2011-08-19 DIAGNOSIS — I2510 Atherosclerotic heart disease of native coronary artery without angina pectoris: Secondary | ICD-10-CM

## 2011-08-19 DIAGNOSIS — I10 Essential (primary) hypertension: Secondary | ICD-10-CM

## 2011-08-19 NOTE — Progress Notes (Signed)
HPI: [name] is a 78 y/o patient of Guy Roberson with known history of CAD, s/p CABG in 2000, hypertension, hypercholesterolemia.  He is usually seen on an annual basis by Guy Roberson. However, he was recently in the ER with what appeared to be a seizure, on 08/14/2011. He had fallen asleep in his chair and awoke with his legs shaking uncontrollably, with associated lightheadedness. On evlauation in ER he was found to be hypokalemic with K+ of 3.3, sodium of 130. There were no signs of infection. He was given potassium replacement and followed up with Guy Roberson who has him on potassium 20 mEq daily. Marland Kitchen He is scheduled to see Guy Roberson for neuro evaluation. Guy Roberson also drew follow-up lab work yesterday. Results are pending. He is without complaint at this time. He has had no complaints of chest pain, or recurrent of shaking symptoms. He remains active.  Allergies  Allergen Reactions  . Penicillins     Current Outpatient Prescriptions  Medication Sig Dispense Refill  . aspirin 81 MG tablet Take 81 mg by mouth 2 (two) times daily.        . ciprofloxacin (CIPRO) 500 MG tablet Take 500 mg by mouth 2 (two) times daily.      . Cyanocobalamin (VITAMIN B 12) 100 MCG LOZG Take 1,000 mg by mouth daily.        . hydrochlorothiazide (HYDRODIURIL) 25 MG tablet Take 25 mg by mouth daily.        Marland Kitchen lisinopril (PRINIVIL,ZESTRIL) 20 MG tablet Take 20 mg by mouth 2 (two) times daily.        . metoprolol (LOPRESSOR) 100 MG tablet Take 50 mg by mouth 2 (two) times daily.       . potassium chloride SA (K-DUR,KLOR-CON) 20 MEQ tablet Take 20 mEq by mouth daily.      . Saw Palmetto 450 MG CAPS Take 450 mg by mouth daily.        . simvastatin (ZOCOR) 20 MG tablet Take 20 mg by mouth at bedtime.       . temazepam (RESTORIL) 30 MG capsule Take 30 mg by mouth at bedtime as needed.          Past Medical History  Diagnosis Date  . Arteriosclerotic cardiovascular disease (ASCVD)     90% LAD stenosis in 1995  treated with PTCA; 3-vessel disease in 2011 requiring CABG; normal EF  . Hyperlipidemia   . Fasting hyperglycemia   . Hypertension   . Colonic polyp   . Trauma 1952    Abdominal and chest in 1952; multiple injuries including pelvic fracture, rib fractures and ruptured bladder    Past Surgical History  Procedure Date  . Femoral hernia repair   . Bladder surgery   . Lumbar spine surgery     +pelvis following trauma  . Coronary artery bypass graft 09/2009  . Colonoscopy w/ polypectomy     ZOX:WRUEAV of systems complete and found to be negative unless listed above PHYSICAL EXAM BP 173/71  Pulse 53  Resp 16  Ht 5\' 7"  (1.702 m)  Wt 169 lb (76.658 kg)  BMI 26.47 kg/m2  General: Well developed, well nourished, in no acute distress Head: Eyes PERRLA, No xanthomas.   Normal cephalic and atramatic  Lungs: Clear bilaterally to auscultation and percussion. Heart: HRRR S1 S2, without MRG.  Pulses are 2+ & equal.            No carotid bruit. No JVD.  No abdominal  bruits. No femoral bruits. Abdomen: Bowel sounds are positive, abdomen soft and non-tender without masses or                  Hernia's noted. Msk:  Back normal, normal gait. Normal strength and tone for age. Extremities: No clubbing, cyanosis or edema.  DP +1 Neuro: Alert and oriented X 3. Psych:  Good affect, responds appropriately    ASSESSMENT AND PLAN

## 2011-08-19 NOTE — Assessment & Plan Note (Signed)
I have rechecked his BP in the exam room after talking and examining him. BP is 120/70. He will remain on current medications. HCTZ can deplete potassium and would therefore keep him on potassium supplement with careful lab follow-up,since he is on lisinopril which can increase potassium levels. Will review labs when they are available. He will see Dr.Rothbart in November as previously scheduled unless he becomes symptomatic.

## 2011-08-19 NOTE — Patient Instructions (Signed)
Your physician recommends that you schedule a follow-up appointment in: Keep Follow up in November with Dr Dietrich Pates

## 2011-08-19 NOTE — Assessment & Plan Note (Signed)
He is without complaint at this time. Refills have been provided on all medications by Dr. Sudie Bailey. Will see him in 9 months.

## 2011-08-24 ENCOUNTER — Ambulatory Visit (HOSPITAL_COMMUNITY)
Admission: RE | Admit: 2011-08-24 | Discharge: 2011-08-24 | Disposition: A | Discharge status: Home / Self Care | Payer: Medicare Other | Source: Ambulatory Visit | Attending: Family Medicine | Admitting: Family Medicine

## 2011-08-24 DIAGNOSIS — R569 Unspecified convulsions: Secondary | ICD-10-CM | POA: Insufficient documentation

## 2011-08-24 MED ORDER — GADOBENATE DIMEGLUMINE 529 MG/ML IV SOLN
15.0000 mL | Freq: Once | INTRAVENOUS | Status: AC | PRN
  Administered 2011-08-24: 15 mL via INTRAVENOUS

## 2011-09-01 ENCOUNTER — Other Ambulatory Visit: Payer: Self-pay | Admitting: Neurology

## 2011-09-01 DIAGNOSIS — I679 Cerebrovascular disease, unspecified: Secondary | ICD-10-CM

## 2011-09-01 DIAGNOSIS — G250 Essential tremor: Secondary | ICD-10-CM

## 2011-09-01 DIAGNOSIS — G253 Myoclonus: Secondary | ICD-10-CM

## 2011-09-01 DIAGNOSIS — G252 Other specified forms of tremor: Secondary | ICD-10-CM

## 2011-09-05 ENCOUNTER — Ambulatory Visit
Admission: RE | Admit: 2011-09-05 | Discharge: 2011-09-05 | Disposition: A | Discharge status: Home / Self Care | Payer: Medicare Other | Source: Ambulatory Visit | Attending: Neurology | Admitting: Neurology

## 2011-09-05 DIAGNOSIS — G250 Essential tremor: Secondary | ICD-10-CM

## 2011-09-05 DIAGNOSIS — G253 Myoclonus: Secondary | ICD-10-CM

## 2011-09-05 DIAGNOSIS — I679 Cerebrovascular disease, unspecified: Secondary | ICD-10-CM

## 2011-11-02 ENCOUNTER — Encounter (INDEPENDENT_AMBULATORY_CARE_PROVIDER_SITE_OTHER): Payer: Self-pay

## 2011-11-30 DIAGNOSIS — Z950 Presence of cardiac pacemaker: Secondary | ICD-10-CM

## 2011-11-30 HISTORY — DX: Presence of cardiac pacemaker: Z95.0

## 2011-12-16 ENCOUNTER — Encounter (HOSPITAL_COMMUNITY): Payer: Self-pay | Admitting: Emergency Medicine

## 2011-12-16 ENCOUNTER — Emergency Department (HOSPITAL_COMMUNITY): Payer: Medicare Other

## 2011-12-16 ENCOUNTER — Emergency Department (HOSPITAL_COMMUNITY)
Admission: EM | Admit: 2011-12-16 | Discharge: 2011-12-16 | Disposition: A | Discharge status: Home / Self Care | Payer: Medicare Other | Attending: Emergency Medicine | Admitting: Emergency Medicine

## 2011-12-16 DIAGNOSIS — I10 Essential (primary) hypertension: Secondary | ICD-10-CM | POA: Insufficient documentation

## 2011-12-16 DIAGNOSIS — R32 Unspecified urinary incontinence: Secondary | ICD-10-CM | POA: Insufficient documentation

## 2011-12-16 DIAGNOSIS — R296 Repeated falls: Secondary | ICD-10-CM | POA: Insufficient documentation

## 2011-12-16 DIAGNOSIS — I2510 Atherosclerotic heart disease of native coronary artery without angina pectoris: Secondary | ICD-10-CM | POA: Insufficient documentation

## 2011-12-16 DIAGNOSIS — R51 Headache: Secondary | ICD-10-CM | POA: Insufficient documentation

## 2011-12-16 DIAGNOSIS — I25810 Atherosclerosis of coronary artery bypass graft(s) without angina pectoris: Secondary | ICD-10-CM | POA: Insufficient documentation

## 2011-12-16 DIAGNOSIS — R569 Unspecified convulsions: Secondary | ICD-10-CM

## 2011-12-16 DIAGNOSIS — M25519 Pain in unspecified shoulder: Secondary | ICD-10-CM | POA: Insufficient documentation

## 2011-12-16 DIAGNOSIS — IMO0002 Reserved for concepts with insufficient information to code with codable children: Secondary | ICD-10-CM | POA: Insufficient documentation

## 2011-12-16 DIAGNOSIS — Z79899 Other long term (current) drug therapy: Secondary | ICD-10-CM | POA: Insufficient documentation

## 2011-12-16 LAB — CBC
HCT: 45.3 % (ref 39.0–52.0)
Hemoglobin: 16.2 g/dL (ref 13.0–17.0)
WBC: 12.1 10*3/uL — ABNORMAL HIGH (ref 4.0–10.5)

## 2011-12-16 LAB — BASIC METABOLIC PANEL
BUN: 15 mg/dL (ref 6–23)
Chloride: 99 mEq/L (ref 96–112)
GFR calc Af Amer: >90 mL/min (ref >90)
Glucose, Bld: 176 mg/dL — ABNORMAL HIGH (ref 70–99)
Potassium: 3.7 mEq/L (ref 3.5–5.1)
Sodium: 137 mEq/L (ref 135–145)

## 2011-12-16 MED ORDER — LEVETIRACETAM 500 MG PO TABS: 500.0000 mg | ORAL_TABLET | Freq: Two times a day (BID) | ORAL | Status: DC

## 2011-12-16 MED ORDER — OXYCODONE-ACETAMINOPHEN 5-325 MG PO TABS
1.0000 | ORAL_TABLET | Freq: Once | ORAL | Status: AC
  Administered 2011-12-16: 1 via ORAL
  Filled 2011-12-16: qty 1

## 2011-12-16 MED ORDER — OXYCODONE-ACETAMINOPHEN 5-325 MG PO TABS: 1.0000 | ORAL_TABLET | ORAL | Status: AC | PRN

## 2011-12-16 MED ORDER — LEVETIRACETAM 500 MG PO TABS
1000.0000 mg | ORAL_TABLET | Freq: Once | ORAL | Status: AC
  Administered 2011-12-16: 1000 mg via ORAL
  Filled 2011-12-16: qty 2

## 2011-12-16 MED ORDER — ACETAMINOPHEN 325 MG PO TABS
325.0000 mg | ORAL_TABLET | Freq: Once | ORAL | Status: AC
  Administered 2011-12-16: 325 mg via ORAL
  Filled 2011-12-16: qty 1

## 2011-12-16 NOTE — ED Provider Notes (Signed)
78 year old male had an apparent seizure today. He does not remember anything but his is complaining of soreness where he hit the ground when he fell. He had a similar episode several months ago which was worked up as a possible TIA but also reportedly had a negative EEG. His exam today is nonfocal. Of note, he does have bilateral carotid bruits which are louder on the right than on the left. I am not able to find results of his carotid duplex scans on the hospital information system. He will be started on Keppra and referred back to his neurologist for definitive decision whether to continue him on anticonvulsants.  Dione Booze, MD 12/16/11 1540

## 2011-12-16 NOTE — ED Provider Notes (Signed)
History     CSN: [id number]  Arrival date & time 12/16/11  1114   First MD Initiated Contact with Patient 12/16/11 1117      Chief Complaint  Patient presents with  . Seizures    (Consider location/radiation/quality/duration/timing/severity/associated sxs/prior treatment) Patient is a 78 y.o. male presenting with seizures. The history is provided by the patient and the spouse. No language interpreter was used.  Seizures  This is a recurrent problem. The problem has been resolved. There was 1 seizure. Duration: unwitnessed. Pertinent negatives include no headaches, no chest pain, no nausea and no vomiting. Characteristics include bladder incontinence. Characteristics do not include bit tongue. The episode was not witnessed. There was no sensation of an aura present. The seizures did not continue in the ED.  79yo with possible seizure/syncopal episode today while getting in his truck.  Wife found him on the ground laying on his back.  EMS reports he was incontinent and postictal but no tongue biting.  Similar episode 1 month ago and worked up by Dr.  Hahn for possible TIA.  Patient does not remember what happened but states he became dizzy prior to episode.  HR 59 presently.  States he remembers waking up in the ambulance.  Abrasion to the back of the head with pain and R shoulder pain.  A & O pesently PEARL  MAE =.  Memory in tact. LSB removed , philly collar in removed.  No neck pain.   pmh CABG, hypertension, trauma, bladder surgery and hernia repair.    Past Medical History  Diagnosis Date  . Arteriosclerotic cardiovascular disease (ASCVD)     90% LAD stenosis in 1995 treated with PTCA; 3-vessel disease in 2011 requiring CABG; normal EF  . Hyperlipidemia   . Fasting hyperglycemia   . Hypertension   . Colonic polyp   . Trauma 1952    Abdominal and chest in 1952; multiple injuries including pelvic fracture, rib fractures and ruptured bladder    Past Surgical History  Procedure Date    . Femoral hernia repair   . Bladder surgery   . Lumbar spine surgery     +pelvis following trauma  . Coronary artery bypass graft 09/2009  . Colonoscopy w/ polypectomy     Family History  Problem Relation Age of Onset  . Coronary artery disease Brother     x3  . Lung cancer Brother   . Heart block Mother   . Heart murmur Mother   . Aneurysm Father     History  Substance Use Topics  . Smoking status: Never Smoker   . Smokeless tobacco: Current User  . Alcohol Use: No      Review of Systems  Cardiovascular: Negative for chest pain.  Gastrointestinal: Negative for nausea and vomiting.  Genitourinary: Positive for bladder incontinence.  Musculoskeletal:       R shoulder pain.  Posterior head pain.  Neurological: Positive for seizures. Negative for tremors, facial asymmetry, weakness, light-headedness, numbness and headaches.  All other systems reviewed and are negative.    Allergies  Penicillins  Home Medications   Current Outpatient Rx  Name Route Sig Dispense Refill  . ASPIRIN EC 81 MG PO TBEC Oral Take 81 mg by mouth 2 (two) times daily.    Marland Kitchen CIPROFLOXACIN HCL 500 MG PO TABS Oral Take 500 mg by mouth 2 (two) times daily as needed. For infection takes for 5 days for prostate infection    . VITAMIN B 12 PO Oral Take 1 tablet by  mouth daily.    Marland Kitchen HYDROCHLOROTHIAZIDE 25 MG PO TABS Oral Take 25 mg by mouth daily.      Marland Kitchen LISINOPRIL 20 MG PO TABS Oral Take 20 mg by mouth 2 (two) times daily.      Marland Kitchen METOPROLOL TARTRATE 100 MG PO TABS Oral Take 50 mg by mouth 2 (two) times daily.     Marland Kitchen POTASSIUM CHLORIDE CRYS ER 20 MEQ PO TBCR Oral Take 20 mEq by mouth daily.    Marland Kitchen SIMVASTATIN 20 MG PO TABS Oral Take 20 mg by mouth at bedtime.     Marland Kitchen TEMAZEPAM 30 MG PO CAPS Oral Take 30 mg by mouth at bedtime as needed. For sleep      BP 161/73  Pulse 64  Temp 97.5 F (36.4 C) (Oral)  Resp 22  SpO2 100%  Physical Exam  Nursing note and vitals reviewed. Constitutional: He is  oriented to person, place, and time. He appears well-developed and well-nourished.  HENT:  Head: Normocephalic.  Eyes: Conjunctivae and EOM are normal. Pupils are equal, round, and reactive to light.  Neck: Normal range of motion. Neck supple.  Cardiovascular: Normal rate, regular rhythm, normal heart sounds and intact distal pulses.  Exam reveals no gallop.   No murmur heard. Pulmonary/Chest: Effort normal and breath sounds normal. No respiratory distress. He has no wheezes.  Abdominal: Soft.  Genitourinary:       Incontinent of urine  Musculoskeletal: Normal range of motion. He exhibits no edema and no tenderness.  Neurological: He is alert and oriented to person, place, and time. He has normal strength. He displays normal reflexes. No cranial nerve deficit. Coordination normal. GCS eye subscore is 4. GCS verbal subscore is 5. GCS motor subscore is 6.       Abrasion to occipital lobe  Skin: Skin is warm and dry.  Psychiatric: He has a normal mood and affect.    ED Course  Procedures (including critical care time)  Labs Reviewed  CBC - Abnormal; Notable for the following:    WBC 12.1 (*)     Platelets 133 (*)     All other components within normal limits  BASIC METABOLIC PANEL - Abnormal; Notable for the following:    Glucose, Bld 176 (*)     GFR calc non Af Amer 83 (*)     All other components within normal limits  URINALYSIS, ROUTINE W REFLEX MICROSCOPIC   Dg Thoracic Spine 2 View  12/16/2011  *RADIOLOGY REPORT*  Clinical Data: 78 year old male with possible seizure, fall and pain.  THORACIC SPINE - 2 VIEW  Comparison: 08/15/2011 and earlier.  Findings: Sequelae of CABG.  Normal thoracic segmentation.  Stable thoracic vertebral height and alignment.  Flowing osteophytes again noted. Cervicothoracic junction alignment is within normal limits. Sequelae of CABG.  IMPRESSION: No acute fracture or listhesis identified in the thoracic spine.  Original Report Authenticated By: Harley Hallmark, M.D.   Dg Shoulder Right  12/16/2011  *RADIOLOGY REPORT*  Clinical Data: 78 year old male with fall, seizure, pain.  RIGHT SHOULDER - 2+ VIEW  Comparison: None.  Findings: No glenohumeral joint dislocation.  Degenerative changes at the right humerus greater tuberosity.  Proximal right humerus and right clavicle appear intact.  Scapula appears intact. Visualized right ribs and lung parenchyma within normal limits; incidental azygos fissure.  Right cervical carotid calcified atherosclerosis.  IMPRESSION: No acute fracture or dislocation identified about the right shoulder.  Original Report Authenticated By: Harley Hallmark, M.D.   Ct  Head Wo Contrast  12/16/2011  *RADIOLOGY REPORT*  Clinical Data: 78 year old male with possible seizure.  Postictal behavior and incontinence.  Hypotension.  CT HEAD WITHOUT CONTRAST  Technique:  Contiguous axial images were obtained from the base of the skull through the vertex without contrast.  Comparison: Brain MRI 08/24/2011.  Findings: Mild paranasal sinus mucosal thickening. Visualized orbit soft tissues are within normal limits.  Negative scalp soft tissues. No acute osseous abnormality identified.  Calcified atherosclerosis at the skull base.  No ventriculomegaly. Cerebral volume is within normal limits for age.  No midline shift, mass effect, or evidence of mass lesion.  No acute intracranial hemorrhage identified.  No evidence of cortically based acute infarction identified.  Patchy cerebral white matter hypodensity, appears similar to findings on the comparison. No suspicious intracranial vascular hyperdensity.  IMPRESSION: Stable mild for age white matter changes and otherwise negative noncontrast CT appearance of the brain.  Original Report Authenticated By: Harley Hallmark, M.D.     No diagnosis found.    MDM  Possible seizure activity today unwitnessed, incontinent and postictal.  Loaded with Keppra 1000mg  and keppra 500mg  bid until follow up this week  with Dr. Shakedra Beam Hahn.  Prior workup 1 month ago for the same.  CT head unremarkable, R shoulder film shows no fx.  T spine negative for fx.  Neuro in tact.  Left message at office who will call with appointment.     Labs Reviewed  CBC - Abnormal; Notable for the following:    WBC 12.1 (*)     Platelets 133 (*)     All other components within normal limits  BASIC METABOLIC PANEL - Abnormal; Notable for the following:    Glucose, Bld 176 (*)     GFR calc non Af Amer 83 (*)     All other components within normal limits  URINALYSIS, ROUTINE W REFLEX MICROSCOPIC          Remi Haggard, NP 12/16/11 1553

## 2011-12-16 NOTE — ED Notes (Signed)
Patient transported to CT 

## 2011-12-16 NOTE — Progress Notes (Signed)
Orthopedic Tech Progress Note Patient Details:  Guy Roberson [birth year] [id number]  Ortho Devices Type of Ortho Device: Arm foam sling Ortho Device/Splint Location: (R) UE Ortho Device/Splint Interventions: Application   Jennye Moccasin 12/16/2011, 4:01 PM

## 2011-12-16 NOTE — ED Notes (Addendum)
Pt reports he became dizzy and had a syncopal episode approx 1130 this am. Pt reports he left his house walked to his truck when he "passed out." Spouse reports she found pt laying on the ground outside of his truck unconscious, spouse reports a time frame of approx 5-10 mins, she was able to arouse pt but he was still "in and out" and was "not making much sense." Pt denies any hx of seizure, pt reports a similar episode 6 months ago, pt reports having test done but Dr. Gibson Ramp any signs of seizure activity. Pt's son has a hx of seizures. Pt c/o back pain and (R) shoulder pain, pt presents w/an abrasion to (R) posterior head, and loss of bladder.

## 2011-12-16 NOTE — ED Notes (Signed)
Per EMS: pt from home c/o un witnessed possible seizure activity with incontinence of urine and post ictal type behavior; pt with hx of similar x 1 in past; pt hypotensive initially upon arrival but improved without intervention; pt alert at present; pt c/o right elbow skin tear from fall; pt denies pain

## 2011-12-18 NOTE — ED Provider Notes (Signed)
Medical screening examination/treatment/procedure(s) were conducted as a shared visit with non-physician practitioner(s) and myself.  I personally evaluated the patient during the encounter   Dione Booze, MD 12/18/11 1433

## 2011-12-29 ENCOUNTER — Ambulatory Visit: Payer: Medicare Other | Admitting: *Deleted

## 2011-12-29 ENCOUNTER — Inpatient Hospital Stay (HOSPITAL_COMMUNITY)
Admission: EM | Admit: 2011-12-29 | Discharge: 2012-01-01 | DRG: 242 | Disposition: A | Discharge status: Home / Self Care | Payer: Medicare Other | Attending: Cardiovascular Disease | Admitting: Cardiovascular Disease

## 2011-12-29 ENCOUNTER — Encounter (HOSPITAL_COMMUNITY): Payer: Self-pay | Admitting: *Deleted

## 2011-12-29 ENCOUNTER — Telehealth: Payer: Self-pay | Admitting: *Deleted

## 2011-12-29 ENCOUNTER — Emergency Department (HOSPITAL_COMMUNITY): Payer: Medicare Other

## 2011-12-29 ENCOUNTER — Encounter (HOSPITAL_COMMUNITY)
Admission: EM | Disposition: A | Discharge status: Home / Self Care | Payer: Self-pay | Source: Home / Self Care | Attending: Cardiovascular Disease

## 2011-12-29 DIAGNOSIS — I442 Atrioventricular block, complete: Principal | ICD-10-CM | POA: Diagnosis present

## 2011-12-29 DIAGNOSIS — T82190A Other mechanical complication of cardiac electrode, initial encounter: Secondary | ICD-10-CM | POA: Diagnosis present

## 2011-12-29 DIAGNOSIS — I498 Other specified cardiac arrhythmias: Secondary | ICD-10-CM | POA: Diagnosis present

## 2011-12-29 DIAGNOSIS — Y831 Surgical operation with implant of artificial internal device as the cause of abnormal reaction of the patient, or of later complication, without mention of misadventure at the time of the procedure: Secondary | ICD-10-CM | POA: Diagnosis present

## 2011-12-29 DIAGNOSIS — Z7982 Long term (current) use of aspirin: Secondary | ICD-10-CM

## 2011-12-29 DIAGNOSIS — Z9861 Coronary angioplasty status: Secondary | ICD-10-CM

## 2011-12-29 DIAGNOSIS — R7301 Impaired fasting glucose: Secondary | ICD-10-CM

## 2011-12-29 DIAGNOSIS — I4430 Unspecified atrioventricular block: Secondary | ICD-10-CM

## 2011-12-29 DIAGNOSIS — S2249XA Multiple fractures of ribs, unspecified side, initial encounter for closed fracture: Secondary | ICD-10-CM | POA: Diagnosis present

## 2011-12-29 DIAGNOSIS — R0902 Hypoxemia: Secondary | ICD-10-CM

## 2011-12-29 DIAGNOSIS — I441 Atrioventricular block, second degree: Secondary | ICD-10-CM | POA: Diagnosis not present

## 2011-12-29 DIAGNOSIS — R001 Bradycardia, unspecified: Secondary | ICD-10-CM

## 2011-12-29 DIAGNOSIS — Z79899 Other long term (current) drug therapy: Secondary | ICD-10-CM

## 2011-12-29 DIAGNOSIS — R55 Syncope and collapse: Secondary | ICD-10-CM | POA: Diagnosis present

## 2011-12-29 DIAGNOSIS — E782 Mixed hyperlipidemia: Secondary | ICD-10-CM | POA: Insufficient documentation

## 2011-12-29 DIAGNOSIS — E785 Hyperlipidemia, unspecified: Secondary | ICD-10-CM | POA: Diagnosis present

## 2011-12-29 DIAGNOSIS — Y92009 Unspecified place in unspecified non-institutional (private) residence as the place of occurrence of the external cause: Secondary | ICD-10-CM

## 2011-12-29 DIAGNOSIS — Y9210 Unspecified residential institution as the place of occurrence of the external cause: Secondary | ICD-10-CM | POA: Diagnosis present

## 2011-12-29 DIAGNOSIS — J9600 Acute respiratory failure, unspecified whether with hypoxia or hypercapnia: Secondary | ICD-10-CM | POA: Diagnosis present

## 2011-12-29 DIAGNOSIS — I2510 Atherosclerotic heart disease of native coronary artery without angina pectoris: Secondary | ICD-10-CM | POA: Diagnosis present

## 2011-12-29 DIAGNOSIS — J9601 Acute respiratory failure with hypoxia: Secondary | ICD-10-CM

## 2011-12-29 DIAGNOSIS — W1809XA Striking against other object with subsequent fall, initial encounter: Secondary | ICD-10-CM | POA: Diagnosis present

## 2011-12-29 DIAGNOSIS — I10 Essential (primary) hypertension: Secondary | ICD-10-CM | POA: Diagnosis present

## 2011-12-29 DIAGNOSIS — Z880 Allergy status to penicillin: Secondary | ICD-10-CM

## 2011-12-29 DIAGNOSIS — Z801 Family history of malignant neoplasm of trachea, bronchus and lung: Secondary | ICD-10-CM

## 2011-12-29 DIAGNOSIS — E871 Hypo-osmolality and hyponatremia: Secondary | ICD-10-CM | POA: Diagnosis present

## 2011-12-29 DIAGNOSIS — F17200 Nicotine dependence, unspecified, uncomplicated: Secondary | ICD-10-CM | POA: Diagnosis present

## 2011-12-29 DIAGNOSIS — S2239XA Fracture of one rib, unspecified side, initial encounter for closed fracture: Secondary | ICD-10-CM

## 2011-12-29 DIAGNOSIS — Z8249 Family history of ischemic heart disease and other diseases of the circulatory system: Secondary | ICD-10-CM

## 2011-12-29 DIAGNOSIS — R339 Retention of urine, unspecified: Secondary | ICD-10-CM

## 2011-12-29 DIAGNOSIS — E119 Type 2 diabetes mellitus without complications: Secondary | ICD-10-CM | POA: Diagnosis present

## 2011-12-29 DIAGNOSIS — E1159 Type 2 diabetes mellitus with other circulatory complications: Secondary | ICD-10-CM | POA: Diagnosis present

## 2011-12-29 HISTORY — DX: Atherosclerotic heart disease of native coronary artery without angina pectoris: I25.10

## 2011-12-29 HISTORY — DX: Unspecified convulsions: R56.9

## 2011-12-29 HISTORY — DX: Atrioventricular block, complete: I44.2

## 2011-12-29 HISTORY — DX: Syncope and collapse: R55

## 2011-12-29 HISTORY — PX: LEFT HEART CATHETERIZATION WITH CORONARY/GRAFT ANGIOGRAM: SHX5450

## 2011-12-29 HISTORY — DX: Presence of cardiac pacemaker: Z95.0

## 2011-12-29 HISTORY — DX: Retention of urine, unspecified: R33.9

## 2011-12-29 HISTORY — PX: TEMPORARY PACEMAKER INSERTION: SHX5471

## 2011-12-29 LAB — CBC WITH DIFFERENTIAL/PLATELET
Basophils Relative: 0 % (ref 0–1)
Eosinophils Absolute: 0.2 10*3/uL (ref 0.0–0.7)
Eosinophils Relative: 2 % (ref 0–5)
HCT: 42.6 % (ref 39.0–52.0)
Hemoglobin: 14.9 g/dL (ref 13.0–17.0)
MCH: 30.9 pg (ref 26.0–34.0)
MCHC: 35 g/dL (ref 30.0–36.0)
MCV: 88.4 fL (ref 78.0–100.0)
Monocytes Absolute: 1.1 10*3/uL — ABNORMAL HIGH (ref 0.1–1.0)
Monocytes Relative: 9 % (ref 3–12)

## 2011-12-29 LAB — POCT I-STAT 3, ART BLOOD GAS (G3+)
Patient temperature: 98
pO2, Arterial: 157 mmHg — ABNORMAL HIGH (ref 80.0–100.0)

## 2011-12-29 LAB — GLUCOSE, CAPILLARY: Glucose-Capillary: 163 mg/dL — ABNORMAL HIGH (ref 70–99)

## 2011-12-29 LAB — BASIC METABOLIC PANEL
BUN: 15 mg/dL (ref 6–23)
Calcium: 9.8 mg/dL (ref 8.4–10.5)
Chloride: 100 mEq/L (ref 96–112)
Creatinine, Ser: 0.87 mg/dL (ref 0.50–1.35)
GFR calc Af Amer: >90 mL/min (ref >90)
GFR calc non Af Amer: 81 mL/min — ABNORMAL LOW (ref >90)

## 2011-12-29 LAB — MRSA PCR SCREENING: MRSA by PCR: NEGATIVE

## 2011-12-29 SURGERY — TEMPORARY PACEMAKER INSERTION
Anesthesia: LOCAL

## 2011-12-29 MED ORDER — SODIUM CHLORIDE 0.9 % IV SOLN
50.0000 ug/h | INTRAVENOUS | Status: DC
  Administered 2011-12-29: 50 ug/h via INTRAVENOUS
  Filled 2011-12-29: qty 50

## 2011-12-29 MED ORDER — NITROGLYCERIN 0.4 MG SL SUBL: 0.4000 mg | SUBLINGUAL_TABLET | SUBLINGUAL | Status: DC | PRN

## 2011-12-29 MED ORDER — MIDAZOLAM HCL 2 MG/2ML IJ SOLN
INTRAMUSCULAR | Status: AC
  Filled 2011-12-29: qty 2

## 2011-12-29 MED ORDER — HEPARIN (PORCINE) IN NACL 2-0.9 UNIT/ML-% IJ SOLN
INTRAMUSCULAR | Status: AC
  Filled 2011-12-29: qty 2000

## 2011-12-29 MED ORDER — VANCOMYCIN HCL IN DEXTROSE 1-5 GM/200ML-% IV SOLN
1000.0000 mg | Freq: Two times a day (BID) | INTRAVENOUS | Status: AC
  Administered 2011-12-30: 1000 mg via INTRAVENOUS
  Filled 2011-12-29: qty 200

## 2011-12-29 MED ORDER — FENTANYL CITRATE 0.05 MG/ML IJ SOLN
INTRAMUSCULAR | Status: AC
  Administered 2011-12-29: 50 ug via INTRAVENOUS
  Filled 2011-12-29: qty 2

## 2011-12-29 MED ORDER — SODIUM CHLORIDE 0.9 % IV SOLN: INTRAVENOUS | Status: DC

## 2011-12-29 MED ORDER — ACETAMINOPHEN 325 MG PO TABS: 650.0000 mg | ORAL_TABLET | ORAL | Status: DC | PRN

## 2011-12-29 MED ORDER — FENTANYL BOLUS VIA INFUSION
50.0000 ug | Freq: Four times a day (QID) | INTRAVENOUS | Status: DC | PRN
  Filled 2011-12-29: qty 100

## 2011-12-29 MED ORDER — MIDAZOLAM HCL 2 MG/2ML IJ SOLN
1.0000 mg | Freq: Once | INTRAMUSCULAR | Status: AC
  Administered 2011-12-29: 1 mg via INTRAVENOUS

## 2011-12-29 MED ORDER — HYDROCODONE-ACETAMINOPHEN 5-325 MG PO TABS: 1.0000 | ORAL_TABLET | ORAL | Status: DC | PRN

## 2011-12-29 MED ORDER — SODIUM CHLORIDE 0.9 % IJ SOLN: 3.0000 mL | Freq: Two times a day (BID) | INTRAMUSCULAR | Status: DC

## 2011-12-29 MED ORDER — FENTANYL CITRATE 0.05 MG/ML IJ SOLN
50.0000 ug | Freq: Once | INTRAMUSCULAR | Status: AC
  Administered 2011-12-29: 50 ug via INTRAVENOUS

## 2011-12-29 MED ORDER — ETOMIDATE 2 MG/ML IV SOLN
20.0000 mg | Freq: Once | INTRAVENOUS | Status: AC
  Administered 2011-12-29: 20 mg via INTRAVENOUS

## 2011-12-29 MED ORDER — SODIUM CHLORIDE 0.9 % IV SOLN: 250.0000 mL | INTRAVENOUS | Status: DC | PRN

## 2011-12-29 MED ORDER — ATROPINE SULFATE 1 MG/ML IJ SOLN
INTRAMUSCULAR | Status: AC
  Administered 2011-12-29: 1 mg via INTRAVENOUS
  Filled 2011-12-29: qty 1

## 2011-12-29 MED ORDER — ONDANSETRON HCL 4 MG/2ML IJ SOLN: 4.0000 mg | Freq: Four times a day (QID) | INTRAMUSCULAR | Status: DC | PRN

## 2011-12-29 MED ORDER — FAMOTIDINE IN NACL 20-0.9 MG/50ML-% IV SOLN
20.0000 mg | INTRAVENOUS | Status: DC
  Administered 2011-12-29: 20 mg via INTRAVENOUS
  Filled 2011-12-29 (×2): qty 50

## 2011-12-29 MED ORDER — SUCCINYLCHOLINE CHLORIDE 20 MG/ML IJ SOLN
100.0000 mg | Freq: Once | INTRAMUSCULAR | Status: AC
  Administered 2011-12-29: 100 mg via INTRAVENOUS

## 2011-12-29 MED ORDER — NITROGLYCERIN 0.2 MG/ML ON CALL CATH LAB
INTRAVENOUS | Status: AC
  Filled 2011-12-29: qty 1

## 2011-12-29 MED ORDER — FENTANYL CITRATE 0.05 MG/ML IJ SOLN
100.0000 ug | Freq: Once | INTRAMUSCULAR | Status: AC
  Administered 2011-12-29: 100 ug via INTRAVENOUS
  Filled 2011-12-29: qty 2

## 2011-12-29 MED ORDER — FENTANYL CITRATE 0.05 MG/ML IJ SOLN: 25.0000 ug | INTRAMUSCULAR | Status: DC | PRN

## 2011-12-29 MED ORDER — SODIUM CHLORIDE 0.9 % IV SOLN
INTRAVENOUS | Status: AC
  Administered 2011-12-29: 1 g via INTRAVENOUS
  Filled 2011-12-29: qty 10

## 2011-12-29 MED ORDER — VANCOMYCIN HCL IN DEXTROSE 1-5 GM/200ML-% IV SOLN
1000.0000 mg | INTRAVENOUS | Status: DC
  Filled 2011-12-29: qty 200

## 2011-12-29 MED ORDER — HEPARIN SODIUM (PORCINE) 5000 UNIT/ML IJ SOLN: 5000.0000 [IU] | Freq: Three times a day (TID) | INTRAMUSCULAR | Status: DC

## 2011-12-29 MED ORDER — SODIUM CHLORIDE 0.9 % IJ SOLN: 3.0000 mL | INTRAMUSCULAR | Status: DC | PRN

## 2011-12-29 MED ORDER — SODIUM CHLORIDE 0.9 % IR SOLN
Status: DC
  Filled 2011-12-29: qty 2

## 2011-12-29 MED ORDER — MIDAZOLAM HCL 2 MG/2ML IJ SOLN
INTRAMUSCULAR | Status: AC
  Administered 2011-12-29: 1 mg via INTRAVENOUS
  Filled 2011-12-29: qty 2

## 2011-12-29 MED ORDER — LIDOCAINE HCL (CARDIAC) 20 MG/ML IV SOLN
INTRAVENOUS | Status: AC
  Filled 2011-12-29: qty 5

## 2011-12-29 MED ORDER — DOPAMINE-DEXTROSE 3.2-5 MG/ML-% IV SOLN
2.0000 ug/kg/min | Freq: Once | INTRAVENOUS | Status: AC
  Administered 2011-12-29: 4.867 ug/kg/min via INTRAVENOUS

## 2011-12-29 MED ORDER — ROCURONIUM BROMIDE 50 MG/5ML IV SOLN
INTRAVENOUS | Status: AC
  Filled 2011-12-29: qty 2

## 2011-12-29 MED ORDER — SUCCINYLCHOLINE CHLORIDE 20 MG/ML IJ SOLN
INTRAMUSCULAR | Status: AC
  Administered 2011-12-29: 100 mg via INTRAVENOUS
  Filled 2011-12-29: qty 1

## 2011-12-29 MED ORDER — ATROPINE SULFATE 1 MG/ML IJ SOLN
1.0000 mg | Freq: Once | INTRAMUSCULAR | Status: AC
  Administered 2011-12-29: 1 mg via INTRAVENOUS

## 2011-12-29 MED ORDER — SODIUM CHLORIDE 0.9 % IV SOLN
1.0000 g | Freq: Once | INTRAVENOUS | Status: AC
  Administered 2011-12-29: 1 g via INTRAVENOUS

## 2011-12-29 MED ORDER — FENTANYL CITRATE 0.05 MG/ML IJ SOLN
10.0000 ug/h | INTRAMUSCULAR | Status: DC
  Filled 2011-12-29: qty 50

## 2011-12-29 MED ORDER — ETOMIDATE 2 MG/ML IV SOLN
INTRAVENOUS | Status: AC
  Administered 2011-12-29: 20 mg via INTRAVENOUS
  Filled 2011-12-29: qty 20

## 2011-12-29 MED ORDER — ASPIRIN EC 81 MG PO TBEC
81.0000 mg | DELAYED_RELEASE_TABLET | Freq: Two times a day (BID) | ORAL | Status: DC
  Filled 2011-12-29 (×3): qty 1

## 2011-12-29 MED ORDER — INSULIN ASPART 100 UNIT/ML ~~LOC~~ SOLN
0.0000 [IU] | SUBCUTANEOUS | Status: DC
  Administered 2011-12-29: 2 [IU] via SUBCUTANEOUS
  Administered 2011-12-30: 1 [IU] via SUBCUTANEOUS
  Administered 2011-12-30: 2 [IU] via SUBCUTANEOUS

## 2011-12-29 MED ORDER — ATROPINE SULFATE 1 MG/ML IJ SOLN
INTRAMUSCULAR | Status: AC
  Filled 2011-12-29: qty 1

## 2011-12-29 MED ORDER — MIDAZOLAM HCL 5 MG/5ML IJ SOLN
4.0000 mg | Freq: Once | INTRAMUSCULAR | Status: AC
  Administered 2011-12-29: 2 mg via INTRAVENOUS
  Filled 2011-12-29: qty 5

## 2011-12-29 MED ORDER — SODIUM CHLORIDE 0.9 % IV SOLN
1.0000 mg/h | INTRAVENOUS | Status: DC
  Filled 2011-12-29 (×2): qty 10

## 2011-12-29 MED ORDER — LIDOCAINE HCL (PF) 1 % IJ SOLN
INTRAMUSCULAR | Status: AC
  Filled 2011-12-29: qty 30

## 2011-12-29 MED ORDER — PROPOFOL 10 MG/ML IV EMUL
5.0000 ug/kg/min | INTRAVENOUS | Status: DC
  Filled 2011-12-29: qty 100

## 2011-12-29 MED ORDER — MIDAZOLAM HCL 2 MG/2ML IJ SOLN: 1.0000 mg | INTRAMUSCULAR | Status: DC | PRN

## 2011-12-29 MED ORDER — LIDOCAINE HCL (PF) 1 % IJ SOLN
INTRAMUSCULAR | Status: AC
  Filled 2011-12-29: qty 60

## 2011-12-29 MED ORDER — ACETAMINOPHEN 325 MG PO TABS: 325.0000 mg | ORAL_TABLET | ORAL | Status: DC | PRN

## 2011-12-29 MED ORDER — DOPAMINE-DEXTROSE 3.2-5 MG/ML-% IV SOLN
INTRAVENOUS | Status: AC
  Administered 2011-12-29: 4.867 ug/kg/min via INTRAVENOUS
  Filled 2011-12-29: qty 250

## 2011-12-29 MED ORDER — SODIUM CHLORIDE 0.9 % IJ SOLN
3.0000 mL | INTRAMUSCULAR | Status: DC | PRN
  Administered 2011-12-30: 3 mL via INTRAVENOUS

## 2011-12-29 MED ORDER — FENTANYL CITRATE 0.05 MG/ML IJ SOLN
INTRAMUSCULAR | Status: AC
  Filled 2011-12-29: qty 2

## 2011-12-29 NOTE — ED Notes (Signed)
Pacer pads applied.   Not being paced at this time.

## 2011-12-29 NOTE — ED Notes (Signed)
Lost capture, increasing mA at this time.  Capture at this time at .

## 2011-12-29 NOTE — CV Procedure (Signed)
   Cardiac Catheterization Procedure Note  Name: Guy Roberson MRN: [id number] DOB: 08/01/1931  Procedure: Left Heart Cath, Selective Coronary Angiography, LV angiography, LIMA angiography, saphenous vein graft angiography, temporary transvenous pacemaker placement.  Indication: 78 year old gentleman with CAD status post CABG. He presents with syncope. He was undergoing an event monitor that apparently demonstrated and 18 seconds pause. He presented today with complete heart block heart rate of 25 beats per minute. He is brought to the cardiac catheterization lab emergently for temporary transvenous pacemaker placement. I plan on doing coronary angiography following pacemaker placement to rule out ischemic disease as a cause of his conduction problems.   Procedural details: The right groin was prepped, draped, and anesthetized with 1% lidocaine. Using the modified Seldinger technique, a 6 French sheath was placed in the right femoral vein. A balloontipped transvenous pacing wire was inserted into the right ventricular apex. The pacing threshold was very good at 0.2 milliamps. The rate was turned to 80 and the output at 5 MA. At that point, the right femoral artery was accessed using the modified Seldinger technique and a 5 French sheath was placed. Standard Judkins catheters were used for coronary angiography, saphenous vein graft angiography, LIMA angiography, and left ventriculography. Catheter exchanges were performed over a guidewire. There were no immediate procedural complications. The patient was transferred to the post catheterization recovery area for further monitoring.  Procedural Findings: Hemodynamics:  AO 125/62 LV 128/16   Coronary angiography: Coronary dominance: right  Left mainstem: Widely patent with mild nonobstructive disease. Mild calcification noted to  Left anterior descending (LAD): The LAD has severe proximal vessel stenosis of 95%. The distal LAD fills competitively  from the mammary artery.  Left circumflex (LCx): The left circumflex is patent. There is a small, diffusely diseased intermediate branch. The AV groove circumflex has a very tight 90% stenosis in the midportion. The vessel is diffusely diseased throughout.  Right coronary artery (RCA): The right coronary artery is a dominant vessel. The vessel is diffusely diseased with mild nonobstructive proximal stenosis, mild to moderate mid vessel stenosis, and patency of the PDA. The saphenous vein graft retrograde fills, likely because of brisk flow through the native vessel.  SVG to RCA: Diffusely narrowed without focal stenosis. The graft is patent.  SVG to obtuse marginal: This is a patent graft with your regularity in the proximal portion of the graft. The distal anastomotic site is widely patent  SVG to diagonal: Widely patent throughout.  LIMA to LAD: The mammary artery is widely patent. The LAD anastomotic site is patent. The proximal LAD retrograde fills in the distal LAD is patent throughout   Left ventriculography: Left ventricular systolic function is vigorous. The left jugular ejection fraction is 70%. There is no significant mitral regurgitation.  Final Conclusions:   1. Complete heart block with successful transvenous pacemaker placement under fluoroscopic guidance 2. Severe three-vessel coronary artery disease 3. Status post aortocoronary bypass surgery with continued patency of all grafts 4. Vigorous left ventricular systolic function with an LVEF estimated at 70-75%   Recommendations: The patient is intubated and sedated. Ventilator management per the CCM team. Appreciate their management of this patient. I reviewed the situation with Dr. Johney Frame and the patient will need permanent pacemaker placement.  [name] 12/29/2011, 3:27 PM

## 2011-12-29 NOTE — Progress Notes (Signed)
eLink Physician-Brief Progress Note Patient Name: Guy Roberson DOB: Aug 20, 1931 MRN: [id number]  Date of Service  12/29/2011   HPI/Events of Note   sup  eICU Interventions  pepcid      Nelda Bucks. 12/29/2011, 10:19 PM

## 2011-12-29 NOTE — H&P (Signed)
Patient ID: Guy Roberson MRN: [id number], DOB/AGE: 06/30/1931   Admit date: 12/29/2011   Primary Physician: Milana Obey, MD Primary Cardiologist: R. Dietrich Pates, MD  Pt. Profile:  78 y/o male with recent history of recurrent syncope who presents on transfer from [facility] secondary to syncope x 2 this AM with documentation of complete heart block.  Problem List  Past Medical History  Diagnosis Date  . CAD (coronary artery disease)     a.  90% LAD stenosis in 1995 treated with PTCA;  b. 09/2009 CABG x 4: LIMA->LAD, VG->Diag, VG->OM, VG->PDA  . Hyperlipidemia   . Fasting hyperglycemia   . Hypertension   . Colonic polyp   . Trauma 1952    Abdominal and chest in 1952; multiple injuries including pelvic fracture, rib fractures and ruptured bladder  . Syncope     a. 11/2011 18 second pauses noted on Event Monitor assoc w/ syncope  . Complete heart block     a. 11/2011    Past Surgical History  Procedure Date  . Femoral hernia repair   . Bladder surgery   . Lumbar spine surgery     +pelvis following trauma  . Coronary artery bypass graft 09/2009  . Colonoscopy w/ polypectomy     Allergies  Allergies  Allergen Reactions  . Penicillins Other (See Comments)    Childhood allergy    HPI  78 y/o male with the above problem list.  In March of this year, he presented to the ER with what was previously described as syncope and seizure-like activity.  He was worked up by neurology and this was relatively unrevealing.  It was felt that perhaps pt had a TIA.  In mid-July, he again presented to the ED after an unwitnessed syncopal episode that occurred at his home while he was trying to get into his truck.  Per ER notes, it was felt that this may have represented another seizure and he was placed on Keppra and advised to f/u with neurology.  It appears that he was also referred to our [place] office for placement of an event monitor.  Pt picked up the event monitor this AM and within an  hour of leaving the office, the office received a call from [facility] stating that pt has had 2, 18 second pauses.  Pt was called @ home and his wife reported 2 syncopal spells since leaving the office.  They were advised to call 911 and EMS took pt to [facility] where he was found to be in CHB with a ventricular rate of 23.  Pt was externally paced and due to discomfort and instability, was intubated and sedated.  He was transferred to [facility] emergently for further eval, cath, and temp wire placement.  Home Medications  Prior to Admission medications   Medication Sig Start Date End Date Taking? Authorizing Provider  aspirin EC 81 MG tablet Take 81 mg by mouth 2 (two) times daily.   Yes Historical Provider, MD  ciprofloxacin (CIPRO) 500 MG tablet Take 500 mg by mouth 2 (two) times daily as needed. For infection takes for 5 days for prostate infection   Yes Historical Provider, MD  hydrochlorothiazide (HYDRODIURIL) 25 MG tablet Take 25 mg by mouth daily.     Yes Historical Provider, MD  HYDROcodone-acetaminophen (VICODIN) 5-500 MG per tablet Take 1 tablet by mouth every 6 (six) hours as needed. For severe pain   Yes Historical Provider, MD  levETIRAcetam (KEPPRA) 500 MG tablet Take 1 tablet (500 mg  total) by mouth every 12 (twelve) hours. 12/16/11 12/15/12 Yes Remi Haggard, NP  lisinopril (PRINIVIL,ZESTRIL) 20 MG tablet Take 20 mg by mouth 2 (two) times daily.     Yes Historical Provider, MD  meclizine (ANTIVERT) 25 MG tablet Take 25 mg by mouth 4 (four) times daily as needed. For vertigo   Yes Historical Provider, MD  metoprolol (LOPRESSOR) 100 MG tablet Take 50 mg by mouth 2 (two) times daily.    Yes Historical Provider, MD  potassium chloride SA (K-DUR,KLOR-CON) 20 MEQ tablet Take 20 mEq by mouth daily.   Yes Historical Provider, MD  simvastatin (ZOCOR) 20 MG tablet Take 20 mg by mouth at bedtime.    Yes Historical Provider, MD  temazepam (RESTORIL) 30 MG capsule Take 30 mg by mouth at bedtime as needed. For  sleep   Yes Historical Provider, MD  vitamin B-12 (CYANOCOBALAMIN) 1000 MCG tablet Take 1,000 mcg by mouth daily.   Yes Historical Provider, MD   Family History - From old records.  Unable to update 2/2 intubated, sedated status.  Family History  Problem Relation Age of Onset  . Coronary artery disease Brother     x3  . Lung cancer Brother   . Heart block Mother   . Heart murmur Mother   . Aneurysm Father     Social History - From old records.  Unable to update 2/2 intubated, sedated status.  History   Social History  . Marital Status: Married    Spouse Name: N/A    Number of Children: N/A  . Years of Education: N/A   Occupational History  . Retired    Social History Main Topics  . Smoking status: Never Smoker   . Smokeless tobacco: Current User  . Alcohol Use: No  . Drug Use: Not on file  . Sexually Active: Not on file   Other Topics Concern  . Not on file   Social History Narrative  . No narrative on file    Review of Systems  Recent syncope as outlined above.  Unable to obtain fully 2/2 intubated/sedated status.  Physical Exam  Blood pressure 135/60, pulse 22, temperature 98.2 F (36.8 C), temperature source Rectal, resp. rate 26, weight 169 lb (76.658 kg), SpO2 92.00%.  General: Intubated, sedated. Psych:  Intubated, sedated. Neuro:  Intubated, sedated. HEENT: Normal  Neck: Supple without JVD. Lungs:  Resp regular, scatt rhonchi. Heart: Irreg. Abdomen: Soft, non-tender, non-distended, BS + x 4.  Extremities: No clubbing, cyanosis or edema. DP/PT/Radials 2+ and equal bilaterally.  Labs   Basename 12/29/11 1245  CKTOTAL --  CKMB --  TROPONINI <0.30   Lab Results  Component Value Date   WBC 12.3* 12/29/2011   HGB 14.9 12/29/2011   HCT 42.6 12/29/2011   MCV 88.4 12/29/2011   PLT 201 12/29/2011    Lab 12/29/11 1245  NA 134*  K 3.9  CL 100  CO2 25  BUN 15  CREATININE 0.87  CALCIUM 9.8  PROT --  BILITOT --  ALKPHOS --  ALT --  AST --    GLUCOSE 160*   Radiology/Studies  Dg Chest Portable 1 View  12/29/2011  *RADIOLOGY REPORT*  Clinical Data: Post intubation and bradycardia.  PORTABLE CHEST - 1 VIEW   IMPRESSION: Endotracheal tube is well-positioned above the carina.  Displaced left rib fractures.   Low lung volumes.  Cannot exclude left basilar densities.  Original Report Authenticated By: Richarda Overlie, M.D.   ECG  CHB, 23  ASSESSMENT AND PLAN  1.  Symptomatic CHB with syncope:  Pt intubated and sedated @ [facility] and was receiving external pacing.  He is currently undergoing diagnostic cardiac cath and temporary wire placement with an eye towards PPM @ some point in the very near future.  Stop keppra, as this was started in ER 2/2 concern for seizures.  2.  CAD:  No reported recent h/o cp.  Will cycle ce in setting of above.  Diag cath ongoing.  3.  HTN:  Stable.    Signed, Nicolasa Ducking, NP 12/29/2011, 2:47 PM  Patient seen, examined. Available data reviewed. Agree with findings, assessment, and plan as outlined by Ward Givens, NP. The patient was brought to the cardiac catheterization lab emergently. He was being transcutaneously paced. His EKG shows complete heart block with a heart rate of 25 and he has been hemodynamically unstable. Plan is to proceed with temporary transvenous pacemaker placement on an emergent basis. After his pacemaker is placed, will probably do a left heart catheterization and coronary angiography to rule out ischemic heart disease as a cause of his conduction problems. Further plans pending the results of the procedure.  [name], M.D. 12/29/2011 3:22 PM

## 2011-12-29 NOTE — Procedures (Signed)
SURGEON:  Hillis Range, MD     PREPROCEDURE DIAGNOSIS:  Complete heart block    POSTPROCEDURE DIAGNOSIS:  Complete heart block     PROCEDURES:   1. Pacemaker implantation.     INTRODUCTION: Guy Roberson is a 79 y.o. male  with a history of recurrent unexplained syncope and progression AV who presents today for pacemaker implantation.  He presents on transfer from [facility] after having syncope and complete heart block requiring external pacing/ intubation/ and subsequent temporary pacing waire placement.  He requires beta blocker therapy long term for CAD.  No reversible causes have been identified.  The patient therefore presents today for urgent pacemaker implantation.     DESCRIPTION OF PROCEDURE:  Informed written consent was obtained, and the patient was brought to the electrophysiology lab in a fasting state.  The patient remains intubated and received IV versed an fentanyl for sedation for the procedure today.  The patients left chest was prepped and draped in the usual sterile fashion by the EP lab staff. The skin overlying the left deltopectoral region was infiltrated with lidocaine for local analgesia.  A 4-cm incision was made over the left deltopectoral region.  A left subcutaneous pacemaker pocket was fashioned using a combination of sharp and blunt dissection. Electrocautery was required to assure hemostasis.     RA/RV Lead Placement: The left axillary vein was cannulated.  Through the left axillary vein, a Medtronic model [id number] (serial number [id number]) right atrial lead and a Medtronic model 5092- 58 (serial number [id number]) right ventricular lead were advanced with fluoroscopic visualization into the right atrial appendage and right ventricular apex positions respectively.  Initial atrial lead P- waves measured 3 mV with impedance of 826 ohms and a threshold of 1.8 V at 0.5 msec.  Right ventricular lead R-waves measured 11 mV with an impedance of 751 ohms and a  threshold of 0.7 V at 0.5 msec.  Both leads were secured to the pectoralis fascia using #2-0 silk over the suture sleeves.   Device Placement:  The leads were then connected to a Medtronic Adapta L model ADDRL 1 (serial number [id number]) pacemaker.  The pocket was irrigated with copious gentamicin solution.  The pacemaker was then placed into the pocket.  The pocket was then closed in 2 layers with 2.0 Vicryl suture for the subcutaneous and subcuticular layers.  Steri- Strips and a sterile dressing were then applied.  There were no early apparent complications.     CONCLUSIONS:   1. Successful implantation of a Medtronic Adapta L dual-chamber pacemaker for complete heart block  2. No early apparent complications.           Hillis Range, MD 12/29/2011 4:54 PM

## 2011-12-29 NOTE — Progress Notes (Signed)
eLink Physician-Brief Progress Note Patient Name: Guy Roberson DOB: 08-27-1931 MRN: [id number]  Date of Service  12/29/2011   HPI/Events of Note   MAP 68  eICU Interventions  Hold off propofol, dc versed, use fent for am weaning short acting, rass goal -1      FEINSTEIN,DANIEL J. 12/29/2011, 7:26 PM

## 2011-12-29 NOTE — Progress Notes (Signed)
Orthopedic Tech Progress Note Patient Details:  Guy Roberson May 28, 1932 [id number]  Ortho Devices Type of Ortho Device: Arm foam sling Ortho Device/Splint Location: left arm Ortho Device/Splint Interventions: Application   [name] 12/29/2011, 8:45 PM

## 2011-12-29 NOTE — Consult Note (Signed)
Name: MANOLITO JUREWICZ MRN: [id number] DOB: September 20, 1931    LOS: 0  Referring Provider:  cooper Reason for Referral:  Vent management   PULMONARY / CRITICAL CARE MEDICINE  HPI:   This is a 78 year old male who was admitted by cardiology on 7/30  for CHB (HR 25 bpm), brought to cath lab for emergent temporary pacer. He was electively intubated for the procedure. Had left heart cath w/ clear CAs. He went from cath lab to EP lab later on the 30th for perm pacemaker. PCCM was asked to assist w/ ventilator management.   Past Medical History  Diagnosis Date  . CAD (coronary artery disease)     a.  90% LAD stenosis in 1995 treated with PTCA;  b. 09/2009 CABG x 4: LIMA->LAD, VG->Diag, VG->OM, VG->PDA  . Hyperlipidemia   . Fasting hyperglycemia   . Hypertension   . Colonic polyp   . Trauma 1952    Abdominal and chest in 1952; multiple injuries including pelvic fracture, rib fractures and ruptured bladder  . Syncope     a. 11/2011 18 second pauses noted on Event Monitor assoc w/ syncope  . Complete heart block     a. 11/2011   Past Surgical History  Procedure Date  . Femoral hernia repair   . Bladder surgery   . Lumbar spine surgery     +pelvis following trauma  . Coronary artery bypass graft 09/2009  . Colonoscopy w/ polypectomy    Prior to Admission medications   Medication Sig Start Date End Date Taking? Authorizing Provider  aspirin EC 81 MG tablet Take 81 mg by mouth 2 (two) times daily.   Yes Historical Provider, MD  ciprofloxacin (CIPRO) 500 MG tablet Take 500 mg by mouth 2 (two) times daily as needed. For infection takes for 5 days for prostate infection   Yes Historical Provider, MD  hydrochlorothiazide (HYDRODIURIL) 25 MG tablet Take 25 mg by mouth daily.     Yes Historical Provider, MD  HYDROcodone-acetaminophen (VICODIN) 5-500 MG per tablet Take 1 tablet by mouth every 6 (six) hours as needed. For severe pain   Yes Historical Provider, MD  levETIRAcetam (KEPPRA) 500 MG tablet Take  1 tablet (500 mg total) by mouth every 12 (twelve) hours. 12/16/11 12/15/12 Yes Remi Haggard, NP  lisinopril (PRINIVIL,ZESTRIL) 20 MG tablet Take 20 mg by mouth 2 (two) times daily.     Yes Historical Provider, MD  meclizine (ANTIVERT) 25 MG tablet Take 25 mg by mouth 4 (four) times daily as needed. For vertigo   Yes Historical Provider, MD  metoprolol (LOPRESSOR) 100 MG tablet Take 50 mg by mouth 2 (two) times daily.    Yes Historical Provider, MD  potassium chloride SA (K-DUR,KLOR-CON) 20 MEQ tablet Take 20 mEq by mouth daily.   Yes Historical Provider, MD  simvastatin (ZOCOR) 20 MG tablet Take 20 mg by mouth at bedtime.    Yes Historical Provider, MD  temazepam (RESTORIL) 30 MG capsule Take 30 mg by mouth at bedtime as needed. For sleep   Yes Historical Provider, MD  vitamin B-12 (CYANOCOBALAMIN) 1000 MCG tablet Take 1,000 mcg by mouth daily.   Yes Historical Provider, MD   Allergies Allergies  Allergen Reactions  . Penicillins Other (See Comments)    Childhood allergy    Family History Family History  Problem Relation Age of Onset  . Coronary artery disease Brother     x3  . Lung cancer Brother   . Heart block Mother   .  Heart murmur Mother   . Aneurysm Father    Social History  reports that he has never smoked. He uses smokeless tobacco. He reports that he does not drink alcohol. His drug history not on file.  Review Of Systems:  Unable, on vent  Brief patient description:   This is a 78 year old male who was admitted by cardiology on 7/30  for CHB (HR 25 bpm), brought to cath lab for emergent temporary pacer. He was electively intubated for the procedure. Had left heart cath w/ clear CAs. He went from cath lab to EP lab later on the 30th for perm pacemaker. PCCM was asked to assist w/ ventilator management.    Vital Signs: Temp:  [98.2 F (36.8 C)] 98.2 F (36.8 C) (07/30 1232) Pulse Rate:  [22-77] 77  (07/30 1427) Resp:  [15-26] 26  (07/30 1247) BP: (80-135)/(32-63)  135/60 mmHg (07/30 1305) SpO2:  [92 %-96 %] 92 % (07/30 1258) Weight:  [76.658 kg (169 lb)] 76.658 kg (169 lb) (07/30 1225)  Physical Examination: General:  Sedated on vent  Neuro:  sedated HEENT:  PERRL, orally intubated  Cardiovascular:  Paced rhythm, no MRG Lungs:  Coarse scattered rhonchi Abdomen:  Large, distended, + bowel sounds  Musculoskeletal:  intact Skin:  No edema   Principal Problem:  *Syncope Active Problems:  DIABETES MELLITUS, TYPE II  HYPERLIPIDEMIA  Complete heart block  Hypertension  CAD (coronary artery disease)   ASSESSMENT AND PLAN  PULMONARY No results found for this basename: PHART:5,PCO2:5,PCO2ART:5,PO2ART:5,HCO3:5,O2SAT:5 in the last 168 hours Ventilator Settings:   CXR:  ETT good position. Low volume film. Left rib fracture ETT:  7/30>>>  A:  Acute Respiratory Failure d/t CHB      Rib fracture P:   Full vent support Daily eval for wean after PPM placed.  Analgesia PRN for rib fx  CARDIOVASCULAR  Lab 12/29/11 1245  TROPONINI <0.30  LATICACIDVEN --  PROBNP --   ECG:   Lines: venous sheath>>  A:  Symptomatic Bradycardia/CHB  P:  PPM per cards Medical RX per cards  RENAL  Lab 12/29/11 1245  NA 134*  K 3.9  CL 100  CO2 25  BUN 15  CREATININE 0.87  CALCIUM 9.8  MG --  PHOS --   Intake/Output    None    Foley:    A:  Mild Hyponatremia P:   Gentle hydration  Hold ace I and antihypertensives  GASTROINTESTINAL No results found for this basename: AST:5,ALT:5,ALKPHOS:5,BILITOT:5,PROT:5,ALBUMIN:5 in the last 168 hours  A:  No acute issues P:   PPI while on vent   HEMATOLOGIC  Lab 12/29/11 1245  HGB 14.9  HCT 42.6  PLT 201  INR 1.01  APTT --   A:  No acute issues P:  Monitor H&H  INFECTIOUS  Lab 12/29/11 1245  WBC 12.3*  PROCALCITON --   Cultures:  Antibiotics:   A:  Mild leukocytosis. No evidence of infection P:   Monitor fever curve and cbc   ENDOCRINE No results found for this  basename: GLUCAP:5 in the last 168 hours A:  No acute issues P:   Monitor   NEUROLOGIC  A:  Sedated on vent  P:   Supportive care   BEST PRACTICE / DISPOSITION Level of Care:  ICU Primary Service:  cards Consultants:  pccm Code Status:  Full  Diet:  dnr DVT Px:  scd GI Px:  ppi Skin Integrity:  intact Social / Family:  Intact   BABCOCK,PETE,  Pulmonary and Critical Care Medicine [facility] Pager: [phone]  12/29/2011, 3:35 PM  Will attempt to extubate today after perm pacer placement, if able to we will, if not then would anticipate extubation in AM.  CC time 55 min.  Patient seen and examined, agree with above note.  I dictated the care and orders written for this patient under my direction.  Koren Bound, M.D. [phone]

## 2011-12-29 NOTE — ED Provider Notes (Addendum)
History  This chart was scribed for Guy Gaskins, MD by [name]. This patient was seen in room APA02/APA02 and the patient's care was started at 12:20PM.  CSN: [id number]  Arrival date & time 12/29/11  1220   None     Chief Complaint  Patient presents with  . Bradycardia     The history is provided by the patient and the EMS personnel. No language interpreter was used.    Guy Roberson is a 78 y.o. male brought in by ambulance, who presents to the Emergency Department complaining of syncope with associated bradycardia. EMS measured HR at 20 en route.  HR is 22 currently. BP was 110 palpated. Pt denies any CP, SOB, HA, and abdominal pain currently. He denies head trauma today. Wife reported to EMS that pt experienced several syncopal episodes today. Wife described that episodes as pt's head would roll back and he would be unresponsive for 2 to 3 seconds. She states that he had a similar episode two weeks ago and was put on a heart monitor by Dr. Dietrich Pates. Pt has a h/o HLD, HTN and ASCVD. He denies smoking and alcohol use.   PCP is Dr. Sudie Bailey. Cardiologist is Dr. Dietrich Pates.  Past Medical History  Diagnosis Date  . Arteriosclerotic cardiovascular disease (ASCVD)     90% LAD stenosis in 1995 treated with PTCA; 3-vessel disease in 2011 requiring CABG; normal EF  . Hyperlipidemia   . Fasting hyperglycemia   . Hypertension   . Colonic polyp   . Trauma 1952    Abdominal and chest in 1952; multiple injuries including pelvic fracture, rib fractures and ruptured bladder    Past Surgical History  Procedure Date  . Femoral hernia repair   . Bladder surgery   . Lumbar spine surgery     +pelvis following trauma  . Coronary artery bypass graft 09/2009  . Colonoscopy w/ polypectomy     Family History  Problem Relation Age of Onset  . Coronary artery disease Brother     x3  . Lung cancer Brother   . Heart block Mother   . Heart murmur Mother   . Aneurysm Father      History  Substance Use Topics  . Smoking status: Never Smoker   . Smokeless tobacco: Current User  . Alcohol Use: No      Review of Systems  Unable to perform ROS: Unstable vital signs  Need for medical intervention.  Allergies  Penicillins  Home Medications   Current Outpatient Rx  Name Route Sig Dispense Refill  . ASPIRIN EC 81 MG PO TBEC Oral Take 81 mg by mouth 2 (two) times daily.    Marland Kitchen CIPROFLOXACIN HCL 500 MG PO TABS Oral Take 500 mg by mouth 2 (two) times daily as needed. For infection takes for 5 days for prostate infection    . VITAMIN B 12 PO Oral Take 1 tablet by mouth daily.    Marland Kitchen HYDROCHLOROTHIAZIDE 25 MG PO TABS Oral Take 25 mg by mouth daily.      Marland Kitchen LEVETIRACETAM 500 MG PO TABS Oral Take 1 tablet (500 mg total) by mouth every 12 (twelve) hours. 60 tablet 0  . LISINOPRIL 20 MG PO TABS Oral Take 20 mg by mouth 2 (two) times daily.      Marland Kitchen METOPROLOL TARTRATE 100 MG PO TABS Oral Take 50 mg by mouth 2 (two) times daily.     Marland Kitchen POTASSIUM CHLORIDE CRYS ER 20 MEQ PO TBCR Oral Take  20 mEq by mouth daily.    Marland Kitchen SIMVASTATIN 20 MG PO TABS Oral Take 20 mg by mouth at bedtime.     Marland Kitchen TEMAZEPAM 30 MG PO CAPS Oral Take 30 mg by mouth at bedtime as needed. For sleep      Triage Vitals: BP 94/32  Pulse 22  Temp 98.2 F (36.8 C) (Rectal)  Resp 15  Wt 169 lb (76.658 kg)  SpO2 96%  Physical Exam  Nursing note and vitals reviewed.  CONSTITUTIONAL: Well developed/well nourished, ill appearing HEAD AND FACE: Normocephalic/atraumatic EYES: EOMI/PERRL ENMT: Mucous membranes moist NECK: supple no meningeal signs SPINE:no cspine tenderness CV: bradycardic, no loud murmurs noted LUNGS: Lungs are clear to auscultation bilaterally, no apparent distress ABDOMEN: soft, nontender, no rebound or guarding NEURO: Pt is awake/alert, moves all extremitiesx4 EXTREMITIES: pulses normal, full ROM SKIN: warm, color normal PSYCH: no abnormalities of mood noted  ED Course   Procedures  INTUBATION Performed by: Guy Roberson  Required items: required blood products, implants, devices, and special equipment available Patient identity confirmed: provided demographic data and hospital-assigned identification number Time out: Immediately prior to procedure a "time out" was called to verify the correct patient, procedure, equipment, support staff and site/side marked as required.  Indications: hypoxia  Intubation method: Glidescope Laryngoscopy   Preoxygenation: BVM  Sedatives: 20mg Etomidate Paralytic: 100mg  Succinylcholine  Tube Size: 7.5 cuffed  Post-procedure assessment: chest rise and ETCO2 monitor Breath sounds: equal and absent over the epigastrium Tube secured with: ETT holder   Patient tolerated the procedure well with no immediate complications.     CRITICAL CARE Performed by: Guy Roberson   Total critical care time: 31  Critical care time was exclusive of separately billable procedures and treating other patients.  Critical care was necessary to treat or prevent imminent or life-threatening deterioration.  Critical care was time spent personally by me on the following activities: development of treatment plan with patient and/or surrogate as well as nursing, discussions with consultants, evaluation of patient's response to treatment, examination of patient, obtaining history from patient or surrogate, ordering and performing treatments and interventions, ordering and review of laboratory studies, ordering and review of radiographic studies, pulse oximetry and re-evaluation of patient's condition.  DIAGNOSTIC STUDIES: Oxygen Saturation is 96% on [place], adequate by my interpretation.    COORDINATION OF CARE: 12:24PM-Discussed treatment plan of atropine/calcium with pt at bedside and pt agreed to plan. 12:26PM-Discussed pt's case with Dr. Dietrich Pates and he agreed to transfer to [facility]. Call ended at 12:28PM. 12:30PM-Informed pt  of consult and of plan to transport to [facility]. 12:45PM-Pt being externally paced at 60.  Pt sedated Dr Dietrich Pates in room, advises to start dopamine.  Transfer has been arranged.  He will go to [facility] for emergent pacemaker placement Pt awake/alert, protecting airway.       MDM  Nursing notes including past medical history and social history reviewed and considered in documentation Previous records reviewed and considered       Date: 12/29/2011  Rate: 23  Rhythm:4:1 AV block  QRS Axis: left  Intervals: abnormal  ST/T Wave abnormalities: nonspecific ST changes  Conduction Disutrbances:right bundle branch block  Narrative Interpretation:   Old EKG Reviewed: changes noted     I personally performed the services described in this documentation, which was scribed in my presence. The recorded information has been reviewed and considered.       Guy Gaskins, MD 12/29/11 1253  1:20 PM Just prior to transfer patient began  to worsen, he became hypoxic, concern for his airway, so patient was intubated without difficulty.  Guy Gaskins, MD 12/29/11 1322

## 2011-12-29 NOTE — Progress Notes (Deleted)
Right femoral arterial sheath was pulled  At 2138 after educating patient about the procedure. Pressure was held on the site for 22 minutes after which a pressure dressing was applied. Patient had dopplerable  DT and PT pulses in right lower extremity before the sheath was pulled out but now has faint palpable pulse. Education about bleeding has been given to patient and patient  nodded to signal his understanding. Will continue to monitor

## 2011-12-29 NOTE — Telephone Encounter (Signed)
Cardionet monitor placed on patient this am.  Received a call from them stating that the patient has had two 18 second episodes of ventricular standstill.  Wife contacted, who states he has passed out twice, since leaving the office.  Advised her to call 911 immediately.  Verbalizes understanding.  Cardionet tracings faxed to Darel Hong, RN in the ED in order for the ED physician to triage patient.

## 2011-12-29 NOTE — Consult Note (Signed)
Please see Dr Randolm Idol note for full detail. Briefly, 78 yo male with coronary disease now presents with complete heart block and syncope.  He transferred from [facility] with external pacing and intubated.  He has received a temporary pacing wire.  He is taking beta blockers for CAD and will require these long term.  No reversible causes for his AV block have been found.   I would therefore recommend pacemaker implantation at this time.  Risks, benefits, alternatives to pacemaker implantation were discussed in detail with the patient's wife today. The patient understands that the risks include but are not limited to bleeding, infection, pneumothorax, perforation, tamponade, vascular damage, renal failure, MI, stroke, death,  and lead dislodgement and wishes to proceed. We will therefore proceed with PPM implant at this time.  Fayrene Fearing Vasti Yagi,MD

## 2011-12-29 NOTE — ED Notes (Signed)
Pt being externally paced at 60.

## 2011-12-29 NOTE — ED Notes (Signed)
Capture met at with rate at 60bpm.

## 2011-12-29 NOTE — ED Notes (Signed)
EMS called out for syncopal episode.  Wife reports pt has had several syncopal episodes this morning, lasting for short period of time.  EMS reports pt was diaphoretic on scene, HR 12bpm, pressure 110 palpated, pt denies pain, alert and oriented at this time.

## 2011-12-30 ENCOUNTER — Encounter (HOSPITAL_COMMUNITY)
Admission: EM | Disposition: A | Discharge status: Home / Self Care | Payer: Self-pay | Source: Home / Self Care | Attending: Cardiovascular Disease

## 2011-12-30 ENCOUNTER — Encounter: Payer: Self-pay | Admitting: *Deleted

## 2011-12-30 ENCOUNTER — Inpatient Hospital Stay (HOSPITAL_COMMUNITY): Payer: Medicare Other

## 2011-12-30 ENCOUNTER — Encounter (HOSPITAL_COMMUNITY): Payer: Self-pay | Admitting: *Deleted

## 2011-12-30 ENCOUNTER — Ambulatory Visit (HOSPITAL_COMMUNITY): Admit: 2011-12-30 | Payer: Self-pay | Admitting: Cardiovascular Disease

## 2011-12-30 DIAGNOSIS — T82190A Other mechanical complication of cardiac electrode, initial encounter: Secondary | ICD-10-CM

## 2011-12-30 DIAGNOSIS — Z950 Presence of cardiac pacemaker: Secondary | ICD-10-CM | POA: Insufficient documentation

## 2011-12-30 HISTORY — PX: LEAD REVISION: SHX5945

## 2011-12-30 LAB — POCT I-STAT 3, ART BLOOD GAS (G3+)
Acid-base deficit: 2 mmol/L (ref 0.0–2.0)
Bicarbonate: 23 mEq/L (ref 20.0–24.0)
O2 Saturation: 83 %
pCO2 arterial: 40.3 mmHg (ref 35.0–45.0)
pH, Arterial: 7.366 (ref 7.350–7.450)
pO2, Arterial: 49 mmHg — ABNORMAL LOW (ref 80.0–100.0)

## 2011-12-30 LAB — CBC
HCT: 40 % (ref 39.0–52.0)
Hemoglobin: 14.2 g/dL (ref 13.0–17.0)
MCH: 31.4 pg (ref 26.0–34.0)
MCV: 88.5 fL (ref 78.0–100.0)
Platelets: 153 10*3/uL (ref 150–400)
RBC: 4.52 MIL/uL (ref 4.22–5.81)
WBC: 13.7 10*3/uL — ABNORMAL HIGH (ref 4.0–10.5)

## 2011-12-30 LAB — BASIC METABOLIC PANEL WITH GFR
BUN: 15 mg/dL (ref 6–23)
CO2: 27 meq/L (ref 19–32)
Calcium: 9 mg/dL (ref 8.4–10.5)
Chloride: 101 meq/L (ref 96–112)
Creatinine, Ser: 0.79 mg/dL (ref 0.50–1.35)
GFR calc Af Amer: >90 mL/min (ref >90)
GFR calc non Af Amer: 84 mL/min — ABNORMAL LOW (ref >90)
Glucose, Bld: 143 mg/dL — ABNORMAL HIGH (ref 70–99)
Potassium: 3.3 meq/L — ABNORMAL LOW (ref 3.5–5.1)
Sodium: 136 meq/L (ref 135–145)

## 2011-12-30 LAB — GLUCOSE, CAPILLARY
Glucose-Capillary: 156 mg/dL — ABNORMAL HIGH (ref 70–99)
Glucose-Capillary: 152 mg/dL — ABNORMAL HIGH (ref 70–99)

## 2011-12-30 LAB — LIPID PANEL: Total CHOL/HDL Ratio: 2.2 RATIO

## 2011-12-30 LAB — TSH: TSH: 0.284 u[IU]/mL — ABNORMAL LOW (ref 0.350–4.500)

## 2011-12-30 LAB — MAGNESIUM: Magnesium: 1.7 mg/dL (ref 1.5–2.5)

## 2011-12-30 SURGERY — PERMANENT PACEMAKER INSERTION
Anesthesia: LOCAL

## 2011-12-30 SURGERY — LEAD REVISION
Anesthesia: LOCAL

## 2011-12-30 MED ORDER — METOPROLOL TARTRATE 50 MG PO TABS
50.0000 mg | ORAL_TABLET | Freq: Two times a day (BID) | ORAL | Status: DC
  Administered 2011-12-30 – 2012-01-01 (×5): 50 mg via ORAL
  Filled 2011-12-30 (×6): qty 1

## 2011-12-30 MED ORDER — SODIUM CHLORIDE 0.9 % IV BOLUS (SEPSIS)
1000.0000 mL | Freq: Once | INTRAVENOUS | Status: AC
  Administered 2011-12-30: 1000 mL via INTRAVENOUS

## 2011-12-30 MED ORDER — POTASSIUM CHLORIDE CRYS ER 20 MEQ PO TBCR
20.0000 meq | EXTENDED_RELEASE_TABLET | Freq: Every day | ORAL | Status: DC
  Administered 2011-12-30 – 2012-01-01 (×3): 20 meq via ORAL
  Filled 2011-12-30 (×3): qty 1

## 2011-12-30 MED ORDER — VITAMIN B-12 1000 MCG PO TABS
1000.0000 ug | ORAL_TABLET | Freq: Every day | ORAL | Status: DC
  Administered 2011-12-31 – 2012-01-01 (×2): 1000 ug via ORAL
  Filled 2011-12-30 (×2): qty 1

## 2011-12-30 MED ORDER — SODIUM CHLORIDE 0.9 % IR SOLN
Status: DC
  Filled 2011-12-30: qty 2

## 2011-12-30 MED ORDER — FENTANYL CITRATE 0.05 MG/ML IJ SOLN
INTRAMUSCULAR | Status: AC
  Filled 2011-12-30: qty 2

## 2011-12-30 MED ORDER — TEMAZEPAM 15 MG PO CAPS
30.0000 mg | ORAL_CAPSULE | Freq: Every evening | ORAL | Status: DC | PRN
  Administered 2011-12-30 – 2011-12-31 (×2): 30 mg via ORAL
  Filled 2011-12-30 (×2): qty 2

## 2011-12-30 MED ORDER — HYDROCHLOROTHIAZIDE 25 MG PO TABS
25.0000 mg | ORAL_TABLET | Freq: Every day | ORAL | Status: DC
  Administered 2011-12-30 – 2012-01-01 (×3): 25 mg via ORAL
  Filled 2011-12-30 (×3): qty 1

## 2011-12-30 MED ORDER — LIDOCAINE HCL (PF) 1 % IJ SOLN
INTRAMUSCULAR | Status: AC
  Filled 2011-12-30: qty 60

## 2011-12-30 MED ORDER — VANCOMYCIN HCL IN DEXTROSE 1-5 GM/200ML-% IV SOLN
1000.0000 mg | Freq: Two times a day (BID) | INTRAVENOUS | Status: AC
  Administered 2011-12-30: 1000 mg via INTRAVENOUS
  Filled 2011-12-30: qty 200

## 2011-12-30 MED ORDER — HYDROCODONE-ACETAMINOPHEN 5-325 MG PO TABS
1.0000 | ORAL_TABLET | ORAL | Status: DC | PRN
  Administered 2011-12-30 (×2): 1 via ORAL
  Filled 2011-12-30 (×2): qty 1

## 2011-12-30 MED ORDER — MIDAZOLAM HCL 5 MG/5ML IJ SOLN
INTRAMUSCULAR | Status: AC
  Filled 2011-12-30: qty 5

## 2011-12-30 MED ORDER — LISINOPRIL 20 MG PO TABS
20.0000 mg | ORAL_TABLET | Freq: Two times a day (BID) | ORAL | Status: DC
  Administered 2011-12-30 – 2012-01-01 (×5): 20 mg via ORAL
  Filled 2011-12-30 (×6): qty 1

## 2011-12-30 MED ORDER — LEVETIRACETAM 500 MG PO TABS
500.0000 mg | ORAL_TABLET | Freq: Two times a day (BID) | ORAL | Status: DC
  Administered 2011-12-30 – 2012-01-01 (×5): 500 mg via ORAL
  Filled 2011-12-30 (×6): qty 1

## 2011-12-30 MED ORDER — ACETAMINOPHEN 325 MG PO TABS: 325.0000 mg | ORAL_TABLET | ORAL | Status: DC | PRN

## 2011-12-30 MED ORDER — SIMVASTATIN 20 MG PO TABS
20.0000 mg | ORAL_TABLET | Freq: Every day | ORAL | Status: DC
  Administered 2011-12-30 – 2011-12-31 (×2): 20 mg via ORAL
  Filled 2011-12-30 (×3): qty 1

## 2011-12-30 MED ORDER — FENTANYL CITRATE 0.05 MG/ML IJ SOLN: 25.0000 ug | INTRAMUSCULAR | Status: DC | PRN

## 2011-12-30 MED ORDER — VANCOMYCIN HCL IN DEXTROSE 1-5 GM/200ML-% IV SOLN
1000.0000 mg | INTRAVENOUS | Status: DC
  Filled 2011-12-30: qty 200

## 2011-12-30 MED ORDER — ONDANSETRON HCL 4 MG/2ML IJ SOLN: 4.0000 mg | Freq: Four times a day (QID) | INTRAMUSCULAR | Status: DC | PRN

## 2011-12-30 MED FILL — Midazolam HCl Inj 5 MG/5ML (Base Equivalent): INTRAMUSCULAR | Qty: 5 | Status: AC

## 2011-12-30 NOTE — Progress Notes (Signed)
Right femoral venous sheath was pulled At 2138 after educating patient about the procedure. Pressure was held on the site for 22 minutes after which a pressure dressing was applied. Patient had dopplerable DT and PT pulses in right lower extremity before the sheath was pulled out but now has faint palpable pulse. Education about bleeding has been given to patient and patient nodded to signal his understanding. Will continue to monitor

## 2011-12-30 NOTE — H&P (View-Only) (Signed)
Patient ID: Guy Roberson, male   DOB: 03/28/1932, 79 y.o.   MRN: [id number] EP  Called by Dr. Allred regarding vetricular undersensing and increase in ventricular threshold on newly implanted PPM. I have reviewed cxr. Difficult to see V lead. Aeration is improved. The patient is awake and alert. He looks like he could be easily extubated. I will defer to pulmonary service the decision about extubation. Will review findings of device interogation with [employer] Rep and discuss with Dr. Allred but would expect to revise lead later today.  Gregg Taylor, M.D. 

## 2011-12-30 NOTE — Progress Notes (Signed)
Patient ID: Guy Roberson, male   DOB: November 15, 1931, 78 y.o.   MRN: [id number]   Called to pt's room for possible pacemaker malfunction.  12 lead EKG obtained and appears that pacing spikes are happening immediately following QRS (in absolute refractory period, so appears to not be sensing the QRS depolarization).  Other vitals stable.  Attempted to notify on call EP regarding this issue.  Pt will need formal device interrogation and possible device reprogramming in am.

## 2011-12-30 NOTE — Op Note (Signed)
PPM lead revision with removal of a recently implanted passive RV lead (micro-disolodgement) and insertion of a new active fixation lead without immediate complication. H#[id number].

## 2011-12-30 NOTE — Progress Notes (Signed)
Patient ID: Guy Roberson, male   DOB: 08-Sep-1931, 78 y.o.   MRN: [id number] EP  Called by Dr. Johney Frame regarding vetricular undersensing and increase in ventricular threshold on newly implanted PPM. I have reviewed cxr. Difficult to see V lead. Aeration is improved. The patient is awake and alert. He looks like he could be easily extubated. I will defer to pulmonary service the decision about extubation. Will review findings of device interogation with [employer] Rep and discuss with Dr. Johney Frame but would expect to revise lead later today.  Lewayne Bunting, M.D.

## 2011-12-30 NOTE — Interval H&P Note (Signed)
History and Physical Interval Note:  12/30/2011 9:51 AM  Guy Roberson  has presented today for surgery, with the diagnosis of bad lead  The various methods of treatment have been discussed with the patient and family. After consideration of risks, benefits and other options for treatment, the patient has consented to  Procedure(s) (LRB): LEAD REVISION (N/A) as a surgical intervention .  The patient's history has been reviewed, patient examined, no change in status, stable for surgery.  I have reviewed the patient's chart and labs.  Questions were answered to the patient's satisfaction.     Lewayne Bunting

## 2011-12-30 NOTE — Care Management Note (Signed)
    Page 1 of 1   12/30/2011     10:05:00 AM   CARE MANAGEMENT NOTE 12/30/2011  Patient:  Guy Roberson, Guy Roberson   Account Number:  [id number]  Date Initiated:  12/30/2011  Documentation initiated by:  Junius Creamer  Subjective/Objective Assessment:   adm w syncope, hypotension     Action/Plan:   lives w wife, pcp dr John Giovanni   Anticipated DC Date:     Anticipated DC Plan:        DC Planning Services  CM consult      Choice offered to / List presented to:             Status of service:   Medicare Important Message given?   (If response is "NO", the following Medicare IM given date fields will be blank) Date Medicare IM given:   Date Additional Medicare IM given:    Discharge Disposition:    Per UR Regulation:  Reviewed for med. necessity/level of care/duration of stay  If discussed at Long Length of Stay Meetings, dates discussed:    Comments:  7/31 10a debbie Alantra Popoca rn,bsn [phone]

## 2011-12-30 NOTE — Procedures (Signed)
Extubation Procedure Note  Patient Details:   Name: Guy Roberson DOB: 1932-05-06 MRN: [id number]   Airway Documentation:  AIRWAYS 7.5 mm (Active)  Secured at (cm) 23 cm 12/29/2011 12:00 AM    Evaluation  O2 sats: stable throughout Complications: No apparent complications Patient did tolerate procedure well. Bilateral Breath Sounds: Clear   Yes  Patient weaned on cpap/ps 5/5 30%. Extubated and placed on 4LNC. HR-77 99% RR-21. 133/43. No stridor present. Patient tolerating well  Adolm Joseph 12/30/2011, 9:19 AM

## 2011-12-30 NOTE — Progress Notes (Signed)
Name: Guy Roberson MRN: [id number] DOB: 12-May-1932    LOS: 1  Referring Provider:  cooper Reason for Referral:  Vent management   PULMONARY / CRITICAL CARE MEDICINE  HPI:   This is a 78 year old male who was admitted by cardiology on 7/30  for CHB (HR 25 bpm), brought to cath lab for emergent temporary pacer. He was electively intubated for the procedure. Had left heart cath w/ clear CAs. He went from cath lab to EP lab later on the 30th for perm pacemaker. PCCM was asked to assist w/ ventilator management.   Vital Signs: Temp:  [98 F (36.7 C)-99.9 F (37.7 C)] 99.9 F (37.7 C) (07/31 0809) Pulse Rate:  [22-85] 76  (07/31 0915) Resp:  [15-27] 17  (07/31 0915) BP: (80-156)/(32-67) 133/43 mmHg (07/31 0900) SpO2:  [92 %-100 %] 97 % (07/31 0915) FiO2 (%):  [30 %-100 %] 30 % (07/31 0800) Weight:  [76.1 kg (167 lb 12.3 oz)-76.658 kg (169 lb)] 76.1 kg (167 lb 12.3 oz) (07/31 0603)  Physical Examination: General:  Alert and interactive, on vent  Neuro:  Arousable and moves ext to commands. HEENT:  PERRL, orally intubated  Cardiovascular:  Paced rhythm, no MRG Lungs:  Coarse scattered rhonchi Abdomen:  Large, distended, + bowel sounds  Musculoskeletal:  intact Skin:  No edema   Principal Problem:  *Syncope Active Problems:  DIABETES MELLITUS, TYPE II  HYPERLIPIDEMIA  Complete heart block  Hypertension  CAD (coronary artery disease)  Acute respiratory failure  Rib fracture  Hypoxemia  ASSESSMENT AND PLAN  PULMONARY  Lab 12/30/11 0851 12/29/11 1728  PHART 7.366 7.484*  PCO2ART 40.3 29.7*  PO2ART 49.0* 157.0*  HCO3 23.0 22.4  O2SAT 83.0 100.0   Ventilator Settings: Vent Mode:  [-] CPAP;PSV FiO2 (%):  [30 %-100 %] 30 % Set Rate:  [12 bmp-16 bmp] 16 bmp Vt Set:  [500 mL] 500 mL PEEP:  [5 cmH20] 5 cmH20 Pressure Support:  [5 cmH20] 5 cmH20 Plateau Pressure:  [14 cmH20-19 cmH20] 16 cmH20 CXR:  ETT good position. Low volume film. Left rib fracture ETT:   7/30>>>  A:  Acute Respiratory Failure d/t CHB.      Rib fracture.  ABG above is venous. P:   SBT to extubate today, going to EP lab later today but ok with cards. Analgesia PRN for rib fx.  CARDIOVASCULAR  Lab 12/29/11 1245  TROPONINI <0.30  LATICACIDVEN --  PROBNP --   ECG:   Lines: venous sheath>>  A:  Symptomatic Bradycardia/CHB  P:  PPM per cards Back to EP lab on 7/31, will defer to cards.  RENAL  Lab 12/30/11 0249 12/29/11 1245  NA -- 134*  K -- 3.9  CL -- 100  CO2 -- 25  BUN -- 15  CREATININE -- 0.87  CALCIUM -- 9.8  MG 1.7 --  PHOS -- --   Intake/Output      07/30 0701 - 07/31 0700 07/31 0701 - 08/01 0700   I.V. (mL/kg) 1270.9 (16.7) 40 (0.5)   IV Piggyback 250    Total Intake(mL/kg) 1520.9 (20) 40 (0.5)   Urine (mL/kg/hr) 1550 (0.8) 75   Total Output 1550 75   Net -29.1 -35         Foley:    A:  Mild Hyponatremia P:   Gentle hydration  Hold ace I and antihypertensives Check BMET today.  GASTROINTESTINAL No results found for this basename: AST:5,ALT:5,ALKPHOS:5,BILITOT:5,PROT:5,ALBUMIN:5 in the last 168 hours  A:  No  acute issues P:   PPI while on vent   HEMATOLOGIC  Lab 12/30/11 0241 12/29/11 1245  HGB 14.2 14.9  HCT 40.0 42.6  PLT 153 201  INR -- 1.01  APTT -- --   A:  No acute issues P:  Monitor H&H  INFECTIOUS  Lab 12/30/11 0241 12/29/11 1245  WBC 13.7* 12.3*  PROCALCITON -- --   Cultures: None Antibiotics: None  A:  Mild leukocytosis. No evidence of infection P:   Monitor fever curve and cbc   ENDOCRINE  Lab 12/30/11 0812 12/30/11 0329 12/29/11 2356 12/29/11 1928  GLUCAP 156* 123* 155* 163*   A:  No acute issues P:   Monitor   NEUROLOGIC  A:  Sedated on vent  P:   Supportive care   Will extubate today, to EP lab for adjustment of leads.    CC time 35 min.  Alyson Reedy, M.D. [facility] Pulmonary/Critical Care Medicine. Pager: [phone]. After hours pager: [phone].

## 2011-12-31 ENCOUNTER — Inpatient Hospital Stay (HOSPITAL_COMMUNITY): Payer: Medicare Other

## 2011-12-31 DIAGNOSIS — I2510 Atherosclerotic heart disease of native coronary artery without angina pectoris: Secondary | ICD-10-CM

## 2011-12-31 DIAGNOSIS — I498 Other specified cardiac arrhythmias: Secondary | ICD-10-CM

## 2011-12-31 DIAGNOSIS — I4430 Unspecified atrioventricular block: Secondary | ICD-10-CM

## 2011-12-31 DIAGNOSIS — I10 Essential (primary) hypertension: Secondary | ICD-10-CM

## 2011-12-31 LAB — BASIC METABOLIC PANEL
BUN: 12 mg/dL (ref 6–23)
CO2: 29 mEq/L (ref 19–32)
Chloride: 100 mEq/L (ref 96–112)
GFR calc Af Amer: >90 mL/min (ref >90)
Glucose, Bld: 109 mg/dL — ABNORMAL HIGH (ref 70–99)
Potassium: 3.4 mEq/L — ABNORMAL LOW (ref 3.5–5.1)

## 2011-12-31 MED ORDER — FAMOTIDINE 20 MG PO TABS
20.0000 mg | ORAL_TABLET | Freq: Every day | ORAL | Status: DC
  Administered 2011-12-31 – 2012-01-01 (×2): 20 mg via ORAL
  Filled 2011-12-31 (×3): qty 1

## 2011-12-31 MED ORDER — POTASSIUM CHLORIDE CRYS ER 20 MEQ PO TBCR
40.0000 meq | EXTENDED_RELEASE_TABLET | Freq: Once | ORAL | Status: AC
  Administered 2011-12-31: 40 meq via ORAL
  Filled 2011-12-31 (×2): qty 1

## 2011-12-31 NOTE — Progress Notes (Signed)
PT Cancellation Note  Treatment cancelled today due to patient's refusal to participate as patient just had bath.  Will return when able to evaluate patient. Thanks.  INGOLD,Stephane Junkins 12/31/2011, 11:44 AM  [facility] [phone] (pager)

## 2011-12-31 NOTE — Progress Notes (Signed)
Two unsuccessful 13fr. Foley cathter insertion attempts.  Met resistance with catheter nearly fully inserted.  Clear yellow urine approximately returned by gravity with catheter tip in urethra.  Catheter not withdrawn prior to second attempt.  Small blood return with second attempt.  Left in situ for patient comfort.  Aseptic technique observed.  Notification of MD for alternate catheterization in progress.  Continuing to monitor closely.

## 2011-12-31 NOTE — Progress Notes (Signed)
   ELECTROPHYSIOLOGY ROUNDING NOTE    Patient Name: Guy Roberson Date of Encounter: 12-31-2011    SUBJECTIVE:Patient feels well.  No chest pain or shortness of breath.  Minimal incisional soreness. Status post RV lead revision 12-30-2011.  TELEMETRY: Reviewed telemetry pt in atrial pacing with intrinsic ventricular conduction, moderate amount of ventricular pacing Filed Vitals:   12/31/11 0210 12/31/11 0300 12/31/11 0400 12/31/11 0500  BP: 114/49 112/47 119/55 118/50  Pulse: 59 60 59 60  Temp:   98.4 F (36.9 C)   TempSrc:   Oral   Resp: 16 13 16 13   Height:      Weight:      SpO2: 96% 95% 96% 95%    GEN- The patient is well appearing, alert and oriented x 3 today.   Head- normocephalic, atraumatic Oropharynx- clear Neck- supple,   Lungs- Clear to ausculation bilaterally, normal work of breathing Chest- pacemaker pocket is without hematoma Heart- Regular rate and rhythm, no murmurs, rubs or gallops, PMI not laterally displaced GI- soft, NT, ND, + BS Extremities- no clubbing, cyanosis, or edema  Pacemaker interrogation- reviewed in detail today,      Intake/Output Summary (Last 24 hours) at 12/31/11 0701 Last data filed at 12/31/11 0541  Gross per 24 hour  Intake   1160 ml  Output   3150 ml  Net  -1990 ml   LABS: Basic Metabolic Panel:  Basename 12/31/11 0535 12/30/11 1354 12/30/11 0249  NA 135 136 --  K 3.4* 3.3* --  CL 100 101 --  CO2 29 27 --  GLUCOSE 109* 143* --  BUN 12 15 --  CREATININE 0.73 0.79 --  CALCIUM 9.3 9.0 --  MG -- -- 1.7  PHOS -- -- --  CBC:  Basename 12/30/11 0241 12/29/11 1245  WBC 13.7* 12.3*  NEUTROABS -- 7.7  HGB 14.2 14.9  HCT 40.0 42.6  MCV 88.5 88.4  PLT 153 201   Fasting Lipid Panel:  Basename 12/30/11 0249  CHOL 95  HDL 43  LDLCALC 33  TRIG 95  CHOLHDL 2.2  LDLDIRECT --   Thyroid Function Tests:  Basename 12/29/11 1845  TSH 0.284*  T4TOTAL --  T3FREE --  THYROIDAB --   Radiology/Studies:  CXR is  stable   A/P: 1. Mobitz II second degree AV block- Doing well s/p PPM Routine wound care  2. HTN- stable  3. CAD- no ischemic symptoms   Wound care, arm mobility, restrictions reviewed with patient.    To telemetry PT to assess Possibly home in am  Fayrene Fearing Sophiana Milanese,MD

## 2011-12-31 NOTE — Op Note (Signed)
NAMEKAMILO, OCH NO.:  [id number]  MEDICAL RECORD NO.:  [id number]  LOCATION:  2908                         FACILITY:  [facility]  PHYSICIAN:  Doylene Canning. Ladona Ridgel, MD    DATE OF BIRTH:  02-02-1932  DATE OF PROCEDURE:  12/30/2011 DATE OF DISCHARGE:                              OPERATIVE REPORT   PROCEDURE PERFORMED:  Removal of a previously implanted passive fixation RV pacing lead and insertion of a new active fixation RV pacing lead. The indication is pacemaker lead micro-dislodgement.  INTRODUCTION:  The patient is a 78 year old man with intermittent complete heart block and pauses of up to 18 seconds.  He underwent permanent pacemaker insertion yesterday.  During the night, the patient developed a micro-dislodgement of his ventricular lead as demonstrated by increasing the pacing thresholds from 0.5-2.5 and reduction in the R- waves from greater than 12-5.  He is now referred for lead revision.  Of note, the patient's passive fixation lead could not be certainly repositioned so that contact was guaranteed and for this reason, it was deemed most appropriate to put a new active fixation pacing lead in place.  PROCEDURE:  After informed consent was obtained, the patient was taken to the Diagnostic EP Lab in the fasting state.  After usual preparation and draping, intravenous fentanyl and midazolam was given for sedation. A 30 mL of lidocaine was infiltrated into the left infraclavicular region.  A 5-cm incision was made over the old pacemaker insertion site and electrocautery was utilized to dissect down to the pacemaker pocket. The pocket was irrigated with antibiotic irrigation.  The ventricular lead was disconnected from the pacemaker generator as was the atrial lead.  The generator was placed in antibiotic irrigation.  The subclavian vein was punctured and the Medtronic model 5076 58-cm active fixation pacing lead, serial number [id number] was advanced into  the right ventricle onto the RV apical septum.  In this location, the R- waves measured approximately 11 millivolts and once lead was actively fixed, the pacing impedance was around 1100 ohms.  The threshold was around 1.5 volts at 0.5 milliseconds.  It should be noted that the initial threshold was nearly 3 volts, but there was a large injury current and with time, the threshold was gradually improved.  It is expected that the pacing threshold will continue to improve.  With these satisfactory parameters, the lead was secured to the subpectoral fascia with a figure-of-eight silk suture and the sewing sleeve was secured with silk suture.  At this point, the previously implanted RV lead was removed with gentle traction.  Pressure was held and hemostasis was obtained.  At this point, the pocket was irrigated with antibiotic irrigation and the Medtronic dual-chamber pacemaker was connected to the new ventricular lead and the old atrial lead and placed back in the subcutaneous pocket.  Additional pocket irrigation was carried out and the incision was closed with 2-0 and 3-0 Vicryl.  Benzoin and Steri- Strips were painted on the skin, pressure dressing was applied, and the patient was returned to his room in satisfactory condition.  COMPLICATIONS:  There were no immediate procedure complications.  RESULTS:  This demonstrate successful pacemaker lead  insertion and removal of a recently implanted lead without immediate procedure complication.     Doylene Canning. Ladona Ridgel, MD     GWT/MEDQ  D:  12/30/2011  T:  12/31/2011  Job:  [id number]  cc:   Hillis Range, MD Gerrit Friends. Dietrich Pates, MD, [facility]

## 2011-12-31 NOTE — Progress Notes (Signed)
Patient complained of not being able to urinate.  Spoke to Dr. Tenny Craw new order to in and out cath received.  Order completed.

## 2011-12-31 NOTE — Progress Notes (Signed)
Name: Guy Roberson MRN: [id number] DOB: 1931-12-10    LOS: 2  Referring Provider:  cooper Reason for Referral:  Vent management   PULMONARY / CRITICAL CARE MEDICINE  HPI:   This is a 78 year old male who was admitted by cardiology on 7/30  for CHB (HR 25 bpm), brought to cath lab for emergent temporary pacer. He was electively intubated for the procedure. Had left heart cath w/ clear CAs. He went from cath lab to EP lab later on the 30th for perm pacemaker. PCCM was asked to assist w/ ventilator management.   Vital Signs: Temp:  [97.8 F (36.6 C)-98.5 F (36.9 C)] 97.8 F (36.6 C) (08/01 0800) Pulse Rate:  [58-103] 81  (08/01 0800) Resp:  [13-22] 18  (08/01 0800) BP: (97-141)/(43-75) 118/50 mmHg (08/01 0500) SpO2:  [81 %-100 %] 81 % (08/01 0800)  Physical Examination: General:  Alert and interactive, on vent  Neuro:  Arousable and moves ext to commands. HEENT:  PERRL, orally intubated  Cardiovascular:  Paced rhythm, no MRG Lungs:  Coarse scattered rhonchi Abdomen:  Large, distended, + bowel sounds  Musculoskeletal:  intact Skin:  No edema   Principal Problem:  *Syncope Active Problems:  DIABETES MELLITUS, TYPE II  HYPERLIPIDEMIA  Complete heart block  Hypertension  CAD (coronary artery disease)  Acute respiratory failure  Rib fracture  Hypoxemia  ASSESSMENT AND PLAN  PULMONARY  Lab 12/30/11 0851 12/29/11 1728  PHART 7.366 7.484*  PCO2ART 40.3 29.7*  PO2ART 49.0* 157.0*  HCO3 23.0 22.4  O2SAT 83.0 100.0   Ventilator Settings:   CXR:  ETT good position. Low volume film. Left rib fracture ETT:  7/30>>>7/31  A:  Acute Respiratory Failure d/t CHB.      Rib fracture.  ABG above is venous. P:   Titrate O2 for sat of 88-92%. OOB to chair as tolerated. PT/OT evaluation. Analgesia PRN for rib fx to avoid atelectasis and pneumonia. IS per RT protocol. Flutter valve per RT protocol.  CARDIOVASCULAR  Lab 12/29/11 1245  TROPONINI <0.30  LATICACIDVEN --    PROBNP --   ECG:   Lines: venous sheath>>out  A:  Symptomatic Bradycardia/CHB  P:  Back to EP lab on 7/31, will defer to cards.  RENAL  Lab 12/31/11 0535 12/30/11 1354 12/30/11 0249 12/29/11 1245  NA 135 136 -- 134*  K 3.4* 3.3* -- --  CL 100 101 -- 100  CO2 29 27 -- 25  BUN 12 15 -- 15  CREATININE 0.73 0.79 -- 0.87  CALCIUM 9.3 9.0 -- 9.8  MG -- -- 1.7 --  PHOS -- -- -- --   Intake/Output      07/31 0701 - 08/01 0700 08/01 0701 - 08/02 0700   P.O. 780 560   I.V. (mL/kg) 180 (2.4)    IV Piggyback 200    Total Intake(mL/kg) 1160 (15.2) 560 (7.4)   Urine (mL/kg/hr) 3150 (1.7) 150   Total Output 3150 150   Net -1990 +410         Foley:    A:  Mild Hyponatremia P:  KVO IVF. Ace I and antihypertensives per cards. Check BMET daily while here. Replace K.  GASTROINTESTINAL No results found for this basename: AST:5,ALT:5,ALKPHOS:5,BILITOT:5,PROT:5,ALBUMIN:5 in the last 168 hours  A:  No acute issues P:   Heart healthy diet.  HEMATOLOGIC  Lab 12/30/11 0241 12/29/11 1245  HGB 14.2 14.9  HCT 40.0 42.6  PLT 153 201  INR -- 1.01  APTT -- --  A:  No acute issues P:  Monitor H&H.  INFECTIOUS  Lab 12/30/11 0241 12/29/11 1245  WBC 13.7* 12.3*  PROCALCITON -- --   Cultures: None Antibiotics: None  A:  Mild leukocytosis. No evidence of infection P:   Monitor fever curve and cbc.  ENDOCRINE  Lab 12/30/11 1629 12/30/11 1131 12/30/11 0812 12/30/11 0329 12/29/11 2356  GLUCAP 145* 152* 156* 123* 155*   A:  No acute issues P:   Monitor   NEUROLOGIC  A:  Sedated on vent  P:   Supportive care.  Alyson Reedy, M.D. [facility] Pulmonary/Critical Care Medicine. Pager: [phone]. After hours pager: [phone].

## 2012-01-01 ENCOUNTER — Encounter (HOSPITAL_COMMUNITY): Payer: Self-pay | Admitting: Nurse Practitioner

## 2012-01-01 DIAGNOSIS — R339 Retention of urine, unspecified: Secondary | ICD-10-CM

## 2012-01-01 MED ORDER — ASPIRIN EC 81 MG PO TBEC: 81.0000 mg | DELAYED_RELEASE_TABLET | Freq: Every day | ORAL | Status: DC

## 2012-01-01 NOTE — Progress Notes (Signed)
IP rehab RN called to place coude catheter on pt. Attempt made to place catheter earlier today and unable to inflate balloon, pt with history of bladder rupture and inability to void post-op. Prepped sterile field and explained procedure to pt. Inserted 70fr coude catheter, mild resistance felt and pt indicating mild discomfort but catheter able to be advanced and urine return noted. Inserted catheter all the way to Y junction and slowly inflated balloon, pt reported he did not feel any discomfort this time. Gently retracted catheter until resistance felt, balloon inflated and holding catheter in place. Approximately 300cc clear yellow urine in foley bag and pt comfortable. Continue to monitor. Mick Sell, RN

## 2012-01-01 NOTE — Progress Notes (Signed)
     Patient: Guy Roberson Date of Encounter: 01/01/2012, 8:16 AM Admit date: 12/29/2011     Subjective  Guy Roberson reports he is tired and didn't get a lot of rest/sleep last night. He is worried about not being able to urinate. Overnight, in and out cath unsuccessful by floor RN so IP rehab RN called who placed catheter. Guy Roberson any cardiac complaints, specifically CP, SOB or palpitations.    Objective  Physical Exam: Vitals: BP 144/73  Pulse 60  Temp 98.2 F (36.8 C) (Oral)  Resp 18  Ht 5\' 9"  (1.753 m)  Wt 167 lb 12.3 oz (76.1 kg)  BMI 24.78 kg/m2  SpO2 97% General: Well developed 78 year old male in no acute distress. Neck: Supple. JVD not elevated. Lungs: Clear bilaterally to auscultation without wheezes, rales, or rhonchi. Breathing is unlabored. Heart: RRR S1 S2 without murmurs, rub or gallop.  Abdomen: Soft, non-distended. Extremities: No clubbing or cyanosis. No edema.  Distal pedal pulses are 2+ and equal bilaterally. Neuro: Alert and oriented X 3. Moves all extremities spontaneously. No focal deficits.  Intake/Output: Intake/Output Summary (Last 24 hours) at 01/01/12 0752 Last data filed at 01/01/12 0556  Gross per 24 hour  Intake    800 ml  Output   2300 ml  Net  -1500 ml    Inpatient Medications:  . aspirin EC  81 mg Oral BID  . famotidine  20 mg Oral Daily  . hydrochlorothiazide  25 mg Oral Daily  . levetiracetam  500 mg Oral Q12H  . lisinopril  20 mg Oral BID  . metoprolol  50 mg Oral BID  . potassium chloride SA  20 mEq Oral Daily  . potassium chloride  40 mEq Oral Once  . simvastatin  20 mg Oral QHS  . vitamin B-12  1,000 mcg Oral Daily   Labs:  [facility] 12/31/11 0535 12/30/11 1354 12/30/11 0249  NA 135 136 --  K 3.4* 3.3* --  CL 100 101 --  CO2 29 27 --  GLUCOSE 109* 143* --  BUN 12 15 --  CREATININE 0.73 0.79 --  CALCIUM 9.3 9.0 --  MG -- -- 1.7  PHOS -- -- --    Basename 12/30/11 0241 12/29/11 1245  WBC 13.7* 12.3*  NEUTROABS  -- 7.7  HGB 14.2 14.9  HCT 40.0 42.6  MCV 88.5 88.4  PLT 153 201    Basename 12/29/11 1245  CKTOTAL --  CKMB --  TROPONINI <0.30    Radiology/Studies: Dg Chest 2 View 12/31/2011  IMPRESSION: Extubated.  Stable vascular congestion and atelectasis  Negative for pneumothorax following pacer revision   Original Report Authenticated By: Judie Petit. Ruel Favors, M.D.   Telemetry: Normal sinus rhythm currently; intermittent A pacing   Assessment and Plan  1. Mobitz II AV block s/p PPM implantation 12/29/2011, s/p RV lead revision 12/30/2011 (for micro-dislodgement) - PPM implant site intact without bleeding or hematoma; rhythm stable; reviewed wound care and activity restrictions 2. HTN - stable 3. CAD - stable without anginal symptoms; continue medical therapy  Dr. Ladona Ridgel to see and make further recommendations Signed, Hinda Lindor PA-C

## 2012-01-01 NOTE — Evaluation (Signed)
Physical Therapy Evaluation Patient Details Name: Guy Roberson MRN: [id number] DOB: 1931-07-31 Today's Date: 01/01/2012 Time: 4098-1191 PT Time Calculation (min): 31 min  PT Assessment / Plan / Recommendation Clinical Impression  pt presents with Pacer.  pt required MinA for all OOB activity.  Discussed with pt to start using cane at home and having family provide increased A at home.  pt would benefit from being seen again tomorrow for PT to continue mobility prior to D/C to home with HHPT.      PT Assessment  Patient needs continued PT services    Follow Up Recommendations  Home health PT;Supervision/Assistance - 24 hour    Barriers to Discharge None      Equipment Recommendations  None recommended by PT    Recommendations for Other Services     Frequency Min 3X/week    Precautions / Restrictions Precautions Precautions: Fall;ICD/Pacemaker Restrictions Weight Bearing Restrictions: No   Pertinent Vitals/Pain Denies pain.        Mobility  Bed Mobility Bed Mobility: Supine to Sit;Sitting - Scoot to Edge of Bed Supine to Sit: 6: Modified independent (Device/Increase time) Sitting - Scoot to Edge of Bed: 6: Modified independent (Device/Increase time) Details for Bed Mobility Assistance: pt needed increased time.   Transfers Transfers: Sit to Stand;Stand to Sit Sit to Stand: 4: Min assist;With upper extremity assist;From bed Stand to Sit: 4: Min guard;With upper extremity assist;To chair/3-in-1 Details for Transfer Assistance: pt with R sided lean.   Ambulation/Gait Ambulation/Gait Assistance: 4: Min assist Ambulation Distance (Feet): 100 Feet Assistive device: 1 person hand held assist Ambulation/Gait Assistance Details: pt required consistent R sided A secondary to R sided lean and LOB to R.   Gait Pattern: Step-through pattern;Decreased stride length;Lateral trunk lean to right Stairs: No Wheelchair Mobility Wheelchair Mobility: No    Exercises     PT  Diagnosis: Difficulty walking  PT Problem List: Decreased activity tolerance;Decreased balance;Decreased mobility;Decreased knowledge of use of DME PT Treatment Interventions: DME instruction;Gait training;Stair training;Functional mobility training;Therapeutic activities;Therapeutic exercise;Balance training;Patient/family education   PT Goals Acute Rehab PT Goals PT Goal Formulation: With patient Time For Goal Achievement: 01/08/12 Potential to Achieve Goals: Good Pt will go Sit to Stand: with modified independence PT Goal: Sit to Stand - Progress: Goal set today Pt will go Stand to Sit: with modified independence PT Goal: Stand to Sit - Progress: Goal set today Pt will Ambulate: >150 feet;with modified independence;with least restrictive assistive device PT Goal: Ambulate - Progress: Goal set today Pt will Go Up / Down Stairs: 1-2 stairs;with supervision;with least restrictive assistive device PT Goal: Up/Down Stairs - Progress: Goal set today  Visit Information  Last PT Received On: 01/01/12 Assistance Needed: +1    Subjective Data  Subjective: I walked around last night.   Patient Stated Goal: Home   Prior Functioning  Home Living Lives With: Spouse Available Help at Discharge: Family;Available 24 hours/day (Spouse 24/7 and family daily.  ) Type of Home: House Home Access: Stairs to enter [employer] of Steps: 1 Entrance Stairs-Rails: None Home Layout: One level Home Adaptive Equipment: Straight cane Prior Function Level of Independence: Independent Able to Take Stairs?: Yes Driving: Yes Vocation: Retired Musician: No difficulties    Cognition  Overall Cognitive Status: Appears within functional limits for tasks assessed/performed Arousal/Alertness: Awake/alert Orientation Level: Oriented X4 / Intact Behavior During Session: WFL for tasks performed    Extremity/Trunk Assessment Right Lower Extremity Assessment RLE  ROM/Strength/Tone: Within functional levels RLE Sensation: WFL -  Light Touch Left Lower Extremity Assessment LLE ROM/Strength/Tone: Within functional levels LLE Sensation: WFL - Light Touch Trunk Assessment Trunk Assessment: Normal   Balance Balance Balance Assessed: No  End of Session PT - End of Session Equipment Utilized During Treatment: Gait belt (L UE Sling) Activity Tolerance: Patient tolerated treatment well Patient left: in chair;with call bell/phone within reach Nurse Communication: Mobility status  GP     Sunny Schlein, [place] [phone] 01/01/2012, 12:52 PM

## 2012-01-01 NOTE — Progress Notes (Signed)
Name: Guy Roberson MRN: [id number] DOB: Jul 27, 1931    LOS: 3  Referring Provider:  cooper Reason for Referral:  Vent management   PULMONARY / CRITICAL CARE MEDICINE  HPI:   This is a 78 year old male who was admitted by cardiology on 7/30  for CHB (HR 25 bpm), brought to cath lab for emergent temporary pacer. He was electively intubated for the procedure. Had left heart cath w/ clear CAs. He went from cath lab to EP lab later on the 30th for perm pacemaker. PCCM was asked to assist w/ ventilator management.   CXR:  ETT good position. Low volume film. Left rib fracture ETT:  7/30>>>7/31  ECG:   Lines: venous sheath>>out  Vital Signs: Temp:  [97.6 F (36.4 C)-98.7 F (37.1 C)] 98.2 F (36.8 C) (08/02 0556) Pulse Rate:  [60-73] 60  (08/02 0556) Resp:  [17-21] 18  (08/02 0556) BP: (112-184)/(34-74) 144/73 mmHg (08/02 0556) SpO2:  [97 %-98 %] 97 % (08/02 0556) Room air   Physical Examination: General:  Alert and interactive Neuro:  Arousable and moves ext to commands. HEENT:  PERRL, no JVD Cardiovascular:  Paced rhythm, no MRG Lungs: clear Abdomen:  Large, distended, + bowel sounds  Musculoskeletal:  intact Skin:  No edema    Lab 12/31/11 0535 12/30/11 1354 12/29/11 1245  NA 135 136 134*  K 3.4* 3.3* 3.9  CL 100 101 100  CO2 29 27 25   BUN 12 15 15   CREATININE 0.73 0.79 0.87  GLUCOSE 109* 143* 160*    Lab 12/30/11 0241 12/29/11 1245  HGB 14.2 14.9  HCT 40.0 42.6  WBC 13.7* 12.3*  PLT 153 201     Principal Problem:  *Syncope Active Problems:  DIABETES MELLITUS, TYPE II  HYPERLIPIDEMIA  Complete heart block  Hypertension  CAD (coronary artery disease)  Acute respiratory failure  Rib fracture  Hypoxemia  ASSESSMENT AND PLAN A:  Acute Respiratory Failure d/t CHB (resolved)      Rib fracture.   P:   OOB to chair as tolerated. PT/OT evaluation. Analgesia PRN for rib fx to avoid atelectasis and pneumonia. IS per RT protocol. Flutter valve per RT  protocol.   A: Symptomatic Bradycardia/CHB  P:  Back to EP lab on 7/31, will defer to cards.  Hypokalemia P: Replace and recheck per cards  PCCM will s/o. Call PRN.   Patient seen and examined, agree with above note.  I dictated the care and orders written for this patient under my direction.  Koren Bound, M.D. [phone]

## 2012-01-01 NOTE — Progress Notes (Signed)
Pt and wife provided with d/c instructions and education. Pt educated on post pacer activity and groin site care. Pt and wife able to [employer]. Pt has no questions at this time. Pt plans to f/u with appointments following the AVS. No needs at this time. IV removed with tip intact. Clarified that patient to dc with foley in place and follow up with nephrology on Tuesday. Heart monitor cleaned and returned to front. Ramond Craver, RN

## 2012-01-01 NOTE — Discharge Summary (Signed)
Patient ID: Guy Roberson,  MRN: [id number], DOB/AGE: [birth year] 78 y.o.  Admit date: 12/29/2011 Discharge date: 01/01/2012  Primary Care Provider: John Giovanni D Primary Cardiologist: J. Allred, MD  Discharge Diagnoses Principal Problem:  *Syncope with Complete Heart Block  **S/P MDT Adapta Dual chamber PPM this admission. Active Problems:  DIABETES MELLITUS, TYPE II  HYPERLIPIDEMIA  Hypertension  CAD (coronary artery disease)  *S/P prior CABG with 4/4 patent grafts this admission.  Acute respiratory failure/VDRF  *Intubated in setting of CHB this admission.  Rib fracture  Hypoxemia  Urinary retention  *Being discharged with Coude catheter in place.  Allergies Allergies  Allergen Reactions  . Penicillins Other (See Comments)    Childhood allergy   Procedures  Cardiac Catheterization 12/29/2011  Procedural Findings: Hemodynamics:  AO 125/62 LV 128/16              Coronary angiography: Coronary dominance: right  Left mainstem: Widely patent with mild nonobstructive disease. Mild calcification noted to Left anterior descending (LAD): The LAD has severe proximal vessel stenosis of 95%. The distal LAD fills competitively from the mammary artery. Left circumflex (LCx): The left circumflex is patent. There is a small, diffusely diseased intermediate branch. The AV groove circumflex has a very tight 90% stenosis in the midportion. The vessel is diffusely diseased throughout. Right coronary artery (RCA): The right coronary artery is a dominant vessel. The vessel is diffusely diseased with mild nonobstructive proximal stenosis, mild to moderate mid vessel stenosis, and patency of the PDA. The saphenous vein graft retrograde fills, likely because of brisk flow through the native vessel. SVG to RCA: Diffusely narrowed without focal stenosis. The graft is patent. SVG to obtuse marginal: This is a patent graft with your regularity in the proximal portion of the graft. The distal  anastomotic site is widely patent SVG to diagonal: Widely patent throughout. LIMA to LAD: The mammary artery is widely patent. The LAD anastomotic site is patent. The proximal LAD retrograde fills in the distal LAD is patent throughout   Left ventriculography: Left ventricular systolic function is vigorous. The left jugular ejection fraction is 70%. There is no significant mitral regurgitation. _____________  Permanent Pacemaker 12/29/2011  Medtronic Adapta L model ADDRL 1 (serial number [id number]) pacemaker _____________  PPM RV Lead Revision 12/30/2011  PPM lead revision with removal of a recently implanted passive RV lead (micro-disolodgement) and insertion of a new active fixation lead without immediate complication. _____________  History of Present Illness  78 y/o male with the above problem list. In March of this year, he presented to the ER with what was previously described as syncope and seizure-like activity. He was worked up by neurology and this was relatively unrevealing. It was felt that perhaps pt had a TIA. In mid-July, he again presented to the ED after an unwitnessed syncopal episode that occurred at his home while he was trying to get into his truck. Per ER notes, it was felt that this may have represented another seizure and he was placed on Keppra and advised to f/u with neurology. It appears that he was also referred to our [place] office for placement of an event monitor.  On the morning of admission, pt picked up the event monitor and within an hour of leaving the office, the office received a call from [employer] stating that pt has had 2, 18 second pauses. Pt was called @ home and his wife reported 2 syncopal spells since leaving the office. They were advised to call 911 and EMS  took pt to [facility] where he was found to be in CHB with a ventricular rate of 23. Pt was externally paced and due to discomfort and instability, was intubated and sedated. He was transferred to [facility]  emergently for further eval, cath, and temp wire placement.  Hospital Course  Pt underwent emergent Temp wire placement and then was evaluated by electrophysiology.  He subsequently underwent successful PPM placement on 12/29/2011, with placement of a Medtronic Adapta Dual Chambered PPM.  He remained intubated throughout the procedure and required vasopressor support.  He was transferred to the CCU where he was followed closely by critical care medicine.  Vasopressor therapy was eventually weaned off.  In the early morning hours of 7/31, pts pacer was noted to be undersensing the ventricle.  This was reviewed by electrophysiology and he was taken back to the EP lab following extubation on 7/31 where a new RV lead was placed.  Pt tolerated procedure well and afterwards was able to be transferred out to the floor.  Foley catheter was removed on the AM of 8/1 however pt required replacement of foley with a coude catheter secondary to urinary retention.  Pt will be discharged with a foley in place and has f/u with urology on 01/05/2012.  Otherwise, pt has been doing well and ambulating without difficulty.  He will be discharged home today in good condition.  Discharge Vitals Blood pressure 119/51, pulse 60, temperature 98.1 F (36.7 C), temperature source Oral, resp. rate 19, height 5\' 9"  (1.753 m), weight 167 lb 12.3 oz (76.1 kg), SpO2 100.00%.  Filed Weights   12/29/11 1225 12/30/11 0603  Weight: 169 lb (76.658 kg) 167 lb 12.3 oz (76.1 kg)   Labs  CBC  Basename 12/30/11 0241  WBC 13.7*  NEUTROABS --  HGB 14.2  HCT 40.0  MCV 88.5  PLT 153   Basic Metabolic Panel  Basename 12/31/11 0535 12/30/11 1354 12/30/11 0249  NA 135 136 --  K 3.4* 3.3* --  CL 100 101 --  CO2 29 27 --  GLUCOSE 109* 143* --  BUN 12 15 --  CREATININE 0.73 0.79 --  CALCIUM 9.3 9.0 --  MG -- -- 1.7  PHOS -- -- --   Cardiac Enzymes Lab Results  Component Value Date   TROPONINI <0.30 12/29/2011   Fasting Lipid  Panel  Basename 12/30/11 0249  CHOL 95  HDL 43  LDLCALC 33  TRIG 95  CHOLHDL 2.2  LDLDIRECT --   Thyroid Function Tests  Basename 12/29/11 1845  TSH 0.284*  T4TOTAL --  T3FREE --  THYROIDAB --   Disposition  Pt is being discharged home today in good condition.  Follow-up Plans & Appointments  Follow-up Information    Follow up with Chelsea Aus, MD on 01/05/2012. (9:30 AM - Dr. Lenoria Chime PA)    Contact information:   [address] [place] [address] [place] [address] [phone]       Follow up with [facility] on 01/14/2012. (9:30 AM)    Contact information:   [address] [place] [phone]      Follow up with Hillis Range, MD on 04/13/2012. (2:30 PM)    Contact information:   [address] [place] [address] [phone]       Follow up with Milana Obey, MD. (as scheduled)    Contact information:   [address] [place] [address] [phone]  Discharge Medications  Medication List  As of 01/01/2012  3:47 PM   STOP taking these medications         levETIRAcetam 500 MG tablet         TAKE these medications         aspirin EC 81 MG tablet   Take 1 tablet (81 mg total) by mouth daily.      ciprofloxacin 500 MG tablet   Commonly known as: CIPRO   Take 500 mg by mouth 2 (two) times daily as needed. For infection takes for 5 days for prostate infection      hydrochlorothiazide 25 MG tablet   Commonly known as: HYDRODIURIL   Take 25 mg by mouth daily.      HYDROcodone-acetaminophen 5-500 MG per tablet   Commonly known as: VICODIN   Take 1 tablet by mouth every 6 (six) hours as needed. For severe pain      lisinopril 20 MG tablet   Commonly known as: PRINIVIL,ZESTRIL   Take 20 mg by mouth 2 (two) times daily.      meclizine 25 MG tablet   Commonly known as: ANTIVERT   Take 25 mg by mouth 4 (four) times daily as  needed. For vertigo      metoprolol 100 MG tablet   Commonly known as: LOPRESSOR   Take 50 mg by mouth 2 (two) times daily.      potassium chloride SA 20 MEQ tablet   Commonly known as: K-DUR,KLOR-CON   Take 20 mEq by mouth daily.      simvastatin 20 MG tablet   Commonly known as: ZOCOR   Take 20 mg by mouth at bedtime.      temazepam 30 MG capsule   Commonly known as: RESTORIL   Take 30 mg by mouth at bedtime as needed. For sleep      vitamin B-12 1000 MCG tablet   Commonly known as: CYANOCOBALAMIN   Take 1,000 mcg by mouth daily.          Outstanding Labs/Studies  None  Duration of Discharge Encounter   Greater than 30 minutes including physician time.  Signed, Nicolasa Ducking NP 01/01/2012, 3:47 PM

## 2012-01-05 LAB — CULTURE, BLOOD (ROUTINE X 2): Culture: NO GROWTH

## 2012-01-09 ENCOUNTER — Emergency Department (HOSPITAL_COMMUNITY)
Admission: EM | Admit: 2012-01-09 | Discharge: 2012-01-09 | Disposition: A | Discharge status: Home / Self Care | Payer: Medicare Other | Attending: Emergency Medicine | Admitting: Emergency Medicine

## 2012-01-09 ENCOUNTER — Encounter (HOSPITAL_COMMUNITY): Payer: Self-pay | Admitting: *Deleted

## 2012-01-09 DIAGNOSIS — E785 Hyperlipidemia, unspecified: Secondary | ICD-10-CM | POA: Insufficient documentation

## 2012-01-09 DIAGNOSIS — I10 Essential (primary) hypertension: Secondary | ICD-10-CM | POA: Insufficient documentation

## 2012-01-09 DIAGNOSIS — R339 Retention of urine, unspecified: Secondary | ICD-10-CM | POA: Insufficient documentation

## 2012-01-09 DIAGNOSIS — I2510 Atherosclerotic heart disease of native coronary artery without angina pectoris: Secondary | ICD-10-CM | POA: Insufficient documentation

## 2012-01-09 LAB — URINALYSIS, ROUTINE W REFLEX MICROSCOPIC
Glucose, UA: NEGATIVE mg/dL
Hgb urine dipstick: NEGATIVE
Specific Gravity, Urine: 1.01 (ref 1.005–1.030)
Urobilinogen, UA: 0.2 mg/dL (ref 0.0–1.0)
pH: 7 (ref 5.0–8.0)

## 2012-01-09 NOTE — ED Provider Notes (Signed)
History     CSN: [id number]  Arrival date & time 01/09/12  0121   First MD Initiated Contact with Patient 01/09/12 0145      Chief Complaint  Patient presents with  . Urinary Retention    (Consider location/radiation/quality/duration/timing/severity/associated sxs/prior treatment) HPI Comments: Patient discharged 1 week ago after stay for complete heart block and pacemaker placement. He was discharged with Foley catheter. Catheter removed by urology yesterday. He states he voided 4 times during the day yesterday but could not void after 11 PM yesterday. Returns with abdominal discomfort and inability to void. Denies any vomiting, fever, chest pain or shortness of breath. No back pain. No bowel or bladder incontinence. Limping gait for the past 50 years.  The history is provided by the patient.    Past Medical History  Diagnosis Date  . CAD (coronary artery disease)     a.  90% LAD stenosis in 1995 treated with PTCA;  b. 09/2009 CABG x 4: LIMA->LAD, VG->Diag, VG->OM, VG->PDA;  c. 11/2011 Cath: 3VD, 4/4 patent grafts, EF 70%.  . Hyperlipidemia   . Fasting hyperglycemia   . Hypertension   . Colonic polyp   . Trauma 1952    Abdominal and chest in 1952; multiple injuries including pelvic fracture, rib fractures and ruptured bladder  . Syncope     a. 11/2011 18 second pauses noted on Event Monitor assoc w/ syncope  . Complete heart block     a. 11/2011 s/p MDT Adapta L ADDRL 1 Ser # [id number].  . Seizures     a. prior to diagnosis of heart block and syncope, this was in the differential and pt was briefly on Keppra, started by ER MD.  . Urinary retention     Past Surgical History  Procedure Date  . Femoral hernia repair   . Bladder surgery   . Lumbar spine surgery     +pelvis following trauma  . Coronary artery bypass graft 09/2009  . Colonoscopy w/ polypectomy   . Pacemaker placement     Family History  Problem Relation Age of Onset  . Coronary artery disease Brother    x3  . Lung cancer Brother   . Heart block Mother   . Heart murmur Mother   . Aneurysm Father     History  Substance Use Topics  . Smoking status: Never Smoker   . Smokeless tobacco: Current User    Types: Chew  . Alcohol Use: No      Review of Systems  Constitutional: Negative for fever, activity change and appetite change.  HENT: Negative for congestion.   Respiratory: Negative for cough, chest tightness and shortness of breath.   Cardiovascular: Negative for chest pain.  Gastrointestinal: Negative for nausea, vomiting, abdominal pain and diarrhea.  Genitourinary: Positive for decreased urine volume and difficulty urinating.  Musculoskeletal: Negative for back pain.  Neurological: Negative for dizziness and headaches.    Allergies  Penicillins  Home Medications   Current Outpatient Rx  Name Route Sig Dispense Refill  . SILODOSIN 8 MG PO CAPS Oral Take 8 mg by mouth daily with breakfast.    . ASPIRIN EC 81 MG PO TBEC Oral Take 1 tablet (81 mg total) by mouth daily.    Marland Kitchen CIPROFLOXACIN HCL 500 MG PO TABS Oral Take 500 mg by mouth 2 (two) times daily as needed. For infection takes for 5 days for prostate infection    . HYDROCHLOROTHIAZIDE 25 MG PO TABS Oral Take 25 mg by mouth daily.      Marland Kitchen  HYDROCODONE-ACETAMINOPHEN 5-500 MG PO TABS Oral Take 1 tablet by mouth every 6 (six) hours as needed. For severe pain    . LISINOPRIL 20 MG PO TABS Oral Take 20 mg by mouth 2 (two) times daily.      Marland Kitchen MECLIZINE HCL 25 MG PO TABS Oral Take 25 mg by mouth 4 (four) times daily as needed. For vertigo    . METOPROLOL TARTRATE 100 MG PO TABS Oral Take 50 mg by mouth 2 (two) times daily.     Marland Kitchen POTASSIUM CHLORIDE CRYS ER 20 MEQ PO TBCR Oral Take 20 mEq by mouth daily.    Marland Kitchen SIMVASTATIN 20 MG PO TABS Oral Take 20 mg by mouth at bedtime.     Marland Kitchen TEMAZEPAM 30 MG PO CAPS Oral Take 30 mg by mouth at bedtime as needed. For sleep    . VITAMIN B-12 1000 MCG PO TABS Oral Take 1,000 mcg by mouth daily.       BP 160/64  Pulse 60  Temp 97.6 F (36.4 C) (Oral)  Resp 16  Ht 5\' 7"  (1.702 m)  Wt 167 lb (75.751 kg)  BMI 26.16 kg/m2  SpO2 97%  Physical Exam  Constitutional: He is oriented to person, place, and time. He appears well-developed. No distress.  HENT:  Head: Normocephalic and atraumatic.  Mouth/Throat: Oropharynx is clear and moist. No oropharyngeal exudate.  Eyes: Conjunctivae and EOM are normal. Pupils are equal, round, and reactive to light.  Neck: Normal range of motion. Neck supple.  Cardiovascular: Normal rate, regular rhythm and normal heart sounds.   No murmur heard. Pulmonary/Chest: Effort normal and breath sounds normal. No respiratory distress.       Pacemaker site healing well  Abdominal: Soft. There is no tenderness. There is no rebound and no guarding.  Genitourinary:       Normal rectal tone  Musculoskeletal: Normal range of motion. He exhibits no edema and no tenderness.       No midline or paraspinal lumbar pain  Neurological: He is alert and oriented to person, place, and time. No cranial nerve deficit.       5 Out of 5 strength in bilateral lower chin base. +2 DP and PT pulses. Great toe extension intact on the right. Patient states unable to extend L toe 2.2 tripping injury 1 year ago.  Skin: Skin is warm.    ED Course  Procedures (including critical care time)   Labs Reviewed  URINALYSIS, ROUTINE W REFLEX MICROSCOPIC   No results found.   1. Urinary retention       MDM  Urinary retention. In recent hospitalization.  No back pain, incontinence, or lower extremity weakness. Foley catheter replaced by nursing prior to my evaluation. 800 mL of clear yellow urine.  Motor strength intact in the bilateral lower extremities. No evidence of cauda equina. UA negative. Patient feels "100% better" after foley placement and is anxious to go home. Has urology followup with Dr. Retta Diones this week.       Glynn Octave, MD 01/09/12 [phone]

## 2012-01-09 NOTE — ED Notes (Signed)
Pt alert & oriented x4, stable gait. Patient given discharge instructions, paperwork. Patient instructed to stop at the registration desk to finish any additional paperwork. Patient verbalized understanding. Pt left department w/ no further questions.  

## 2012-01-09 NOTE — ED Notes (Signed)
Pt had cath removed yesterday at 1100 & then last night around 1800 flow stopped.

## 2012-01-14 ENCOUNTER — Ambulatory Visit: Payer: Medicare Other

## 2012-01-15 ENCOUNTER — Encounter: Payer: Self-pay | Admitting: Internal Medicine

## 2012-01-15 ENCOUNTER — Ambulatory Visit (INDEPENDENT_AMBULATORY_CARE_PROVIDER_SITE_OTHER): Payer: Medicare Other | Admitting: *Deleted

## 2012-01-15 DIAGNOSIS — I442 Atrioventricular block, complete: Secondary | ICD-10-CM

## 2012-01-15 LAB — PACEMAKER DEVICE OBSERVATION
AL AMPLITUDE: 4 mv
BAMS-0001: 145 {beats}/min
RV LEAD AMPLITUDE: 15.67 mv
RV LEAD IMPEDENCE PM: 815 Ohm
RV LEAD THRESHOLD: 0.5 V
VENTRICULAR PACING PM: 51

## 2012-01-15 NOTE — Progress Notes (Signed)
Wound check-PPM 

## 2012-01-25 ENCOUNTER — Emergency Department (HOSPITAL_COMMUNITY)
Admission: EM | Admit: 2012-01-25 | Discharge: 2012-01-25 | Disposition: A | Discharge status: Home / Self Care | Payer: Medicare Other | Attending: Emergency Medicine | Admitting: Emergency Medicine

## 2012-01-25 ENCOUNTER — Encounter (HOSPITAL_COMMUNITY): Payer: Self-pay | Admitting: *Deleted

## 2012-01-25 DIAGNOSIS — Z9861 Coronary angioplasty status: Secondary | ICD-10-CM | POA: Insufficient documentation

## 2012-01-25 DIAGNOSIS — Z951 Presence of aortocoronary bypass graft: Secondary | ICD-10-CM | POA: Insufficient documentation

## 2012-01-25 DIAGNOSIS — R339 Retention of urine, unspecified: Secondary | ICD-10-CM | POA: Insufficient documentation

## 2012-01-25 DIAGNOSIS — I2510 Atherosclerotic heart disease of native coronary artery without angina pectoris: Secondary | ICD-10-CM | POA: Insufficient documentation

## 2012-01-25 DIAGNOSIS — Z950 Presence of cardiac pacemaker: Secondary | ICD-10-CM | POA: Insufficient documentation

## 2012-01-25 DIAGNOSIS — E785 Hyperlipidemia, unspecified: Secondary | ICD-10-CM | POA: Insufficient documentation

## 2012-01-25 DIAGNOSIS — I10 Essential (primary) hypertension: Secondary | ICD-10-CM | POA: Insufficient documentation

## 2012-01-25 DIAGNOSIS — Z880 Allergy status to penicillin: Secondary | ICD-10-CM | POA: Insufficient documentation

## 2012-01-25 LAB — URINALYSIS, MICROSCOPIC ONLY
Bilirubin Urine: NEGATIVE
Glucose, UA: 100 mg/dL — AB
Hgb urine dipstick: NEGATIVE
Protein, ur: NEGATIVE mg/dL
Urobilinogen, UA: 0.2 mg/dL (ref 0.0–1.0)

## 2012-01-25 NOTE — ED Notes (Addendum)
Pt upon leaving the facility reported his "dizziness" had improved.

## 2012-01-25 NOTE — ED Provider Notes (Signed)
History     CSN: [id number]  Arrival date & time 01/25/12  0605   First MD Initiated Contact with Patient 01/25/12 [phone]      Chief Complaint  Patient presents with  . Urinary Retention    (Consider location/radiation/quality/duration/timing/severity/associated sxs/prior treatment) HPI Guy Roberson is a 78 y.o. male who presents to the Emergency Department complaining of urinary retention since last night. He is one week s/p foley catheter removal for retention. Has been doing well until last night when voiding became difficult. Unable to void this morning.  PCP Dr. Sudie Bailey [facility] Past Medical History  Diagnosis Date  . CAD (coronary artery disease)     a.  90% LAD stenosis in 1995 treated with PTCA;  b. 09/2009 CABG x 4: LIMA->LAD, VG->Diag, VG->OM, VG->PDA;  c. 11/2011 Cath: 3VD, 4/4 patent grafts, EF 70%.  . Hyperlipidemia   . Fasting hyperglycemia   . Hypertension   . Colonic polyp   . Trauma 1952    Abdominal and chest in 1952; multiple injuries including pelvic fracture, rib fractures and ruptured bladder  . Syncope     a. 11/2011 18 second pauses noted on Event Monitor assoc w/ syncope  . Complete heart block     a. 11/2011 s/p MDT Adapta L ADDRL 1 Ser # [id number] H.  . Seizures     a. prior to diagnosis of heart block and syncope, this was in the differential and pt was briefly on Keppra, started by ER MD.  . Urinary retention     Past Surgical History  Procedure Date  . Femoral hernia repair   . Bladder surgery   . Lumbar spine surgery     +pelvis following trauma  . Coronary artery bypass graft 09/2009  . Colonoscopy w/ polypectomy   . Pacemaker placement     Family History  Problem Relation Age of Onset  . Coronary artery disease Brother     x3  . Lung cancer Brother   . Heart block Mother   . Heart murmur Mother   . Aneurysm Father     History  Substance Use Topics  . Smoking status: Never Smoker   . Smokeless tobacco: Current User   Types: Chew  . Alcohol Use: No      Review of Systems  Constitutional: Negative for fever.       10 Systems reviewed and are negative for acute change except as noted in the HPI.  HENT: Negative for congestion.   Eyes: Negative for discharge and redness.  Respiratory: Negative for cough and shortness of breath.   Cardiovascular: Negative for chest pain.  Gastrointestinal: Negative for vomiting and abdominal pain.  Genitourinary: Positive for difficulty urinating.  Musculoskeletal: Negative for back pain.  Skin: Negative for rash.  Neurological: Negative for syncope, numbness and headaches.  Psychiatric/Behavioral:       No behavior change.    Allergies  Penicillins  Home Medications   Current Outpatient Rx  Name Route Sig Dispense Refill  . ASPIRIN EC 81 MG PO TBEC Oral Take 1 tablet (81 mg total) by mouth daily.    Marland Kitchen CIPROFLOXACIN HCL 500 MG PO TABS Oral Take 500 mg by mouth 2 (two) times daily as needed. For infection takes for 5 days for prostate infection    . HYDROCHLOROTHIAZIDE 25 MG PO TABS Oral Take 25 mg by mouth daily.      Marland Kitchen HYDROCODONE-ACETAMINOPHEN 5-500 MG PO TABS Oral Take 1 tablet by mouth every 6 (six) hours  as needed. For severe pain    . LISINOPRIL 20 MG PO TABS Oral Take 20 mg by mouth 2 (two) times daily.      Marland Kitchen MECLIZINE HCL 25 MG PO TABS Oral Take 25 mg by mouth 4 (four) times daily as needed. For vertigo    . METOPROLOL TARTRATE 100 MG PO TABS Oral Take 50 mg by mouth 2 (two) times daily.     Marland Kitchen POTASSIUM CHLORIDE CRYS ER 20 MEQ PO TBCR Oral Take 20 mEq by mouth daily.    Marland Kitchen SILODOSIN 8 MG PO CAPS Oral Take 8 mg by mouth daily with breakfast.    . SIMVASTATIN 20 MG PO TABS Oral Take 20 mg by mouth at bedtime.     Marland Kitchen TEMAZEPAM 30 MG PO CAPS Oral Take 30 mg by mouth at bedtime as needed. For sleep    . VITAMIN B-12 1000 MCG PO TABS Oral Take 1,000 mcg by mouth daily.      BP 150/60  Pulse 66  Resp 16  Ht 5\' 7"  (1.702 m)  Wt 168 lb (76.204 kg)  BMI  26.31 kg/m2  SpO2 98%  Physical Exam  Nursing note and vitals reviewed. Constitutional: He appears well-developed and well-nourished.       Awake, alert, nontoxic appearance.  HENT:  Head: Normocephalic and atraumatic.  Eyes: Right eye exhibits no discharge. Left eye exhibits no discharge.  Neck: Neck supple.  Cardiovascular: Normal heart sounds.        Healing pacemaker site  Pulmonary/Chest: Effort normal and breath sounds normal. He exhibits no tenderness.  Abdominal: Soft. There is no tenderness. There is no rebound.       suprapubic fullness and mild tenderness.  Musculoskeletal: He exhibits no tenderness.       Baseline ROM, no obvious new focal weakness.  Neurological:       Mental status and motor strength appears baseline for patient and situation.  Skin: No rash noted.  Psychiatric: He has a normal mood and affect.    ED Course  Procedures (including critical care time)  Results for orders placed during the hospital encounter of 01/25/12  URINALYSIS, WITH MICROSCOPIC      Component Value Range   Color, Urine YELLOW  YELLOW   APPearance CLEAR  CLEAR   Specific Gravity, Urine 1.015  1.005 - 1.030   pH 6.5  5.0 - 8.0   Glucose, UA 100 (*) NEGATIVE mg/dL   Hgb urine dipstick NEGATIVE  NEGATIVE   Bilirubin Urine NEGATIVE  NEGATIVE   Ketones, ur NEGATIVE  NEGATIVE mg/dL   Protein, ur NEGATIVE  NEGATIVE mg/dL   Urobilinogen, UA 0.2  0.0 - 1.0 mg/dL   Nitrite NEGATIVE  NEGATIVE   Leukocytes, UA NEGATIVE  NEGATIVE   WBC, UA 0-2  <3 WBC/hpf   No results found.   1. Urinary retention       MDM  Patient with urinary retention. Foley catheter placed with relief. Patient to follow up today with [facility]. Pt stable in ED with no significant deterioration in condition.The patient appears reasonably screened and/or stabilized for discharge and I doubt any other medical condition or other [facility] requiring further screening, evaluation, or treatment in the ED at this  time prior to discharge.  MDM Reviewed: nursing note and vitals Interpretation: labs           Nicoletta Dress. Colon Branch, MD 01/25/12 [phone]

## 2012-01-25 NOTE — ED Notes (Signed)
Pt had foley in & was removed a week ago, started have retention again this morning.

## 2012-03-16 ENCOUNTER — Other Ambulatory Visit: Payer: Self-pay | Admitting: Urology

## 2012-03-23 ENCOUNTER — Encounter (HOSPITAL_COMMUNITY): Payer: Self-pay | Admitting: Pharmacy Technician

## 2012-03-29 ENCOUNTER — Encounter (HOSPITAL_COMMUNITY): Payer: Self-pay

## 2012-03-29 ENCOUNTER — Ambulatory Visit (HOSPITAL_COMMUNITY)
Admission: RE | Admit: 2012-03-29 | Discharge: 2012-03-29 | Disposition: A | Discharge status: Home / Self Care | Payer: Medicare Other | Source: Ambulatory Visit | Attending: Urology | Admitting: Urology

## 2012-03-29 ENCOUNTER — Encounter (HOSPITAL_COMMUNITY)
Admission: RE | Admit: 2012-03-29 | Discharge: 2012-03-29 | Disposition: A | Discharge status: Home / Self Care | Payer: Medicare Other | Source: Ambulatory Visit | Attending: Urology | Admitting: Urology

## 2012-03-29 DIAGNOSIS — Z951 Presence of aortocoronary bypass graft: Secondary | ICD-10-CM | POA: Insufficient documentation

## 2012-03-29 DIAGNOSIS — Z950 Presence of cardiac pacemaker: Secondary | ICD-10-CM | POA: Insufficient documentation

## 2012-03-29 DIAGNOSIS — Z01818 Encounter for other preprocedural examination: Secondary | ICD-10-CM | POA: Insufficient documentation

## 2012-03-29 DIAGNOSIS — Z01812 Encounter for preprocedural laboratory examination: Secondary | ICD-10-CM | POA: Insufficient documentation

## 2012-03-29 HISTORY — DX: Other complications of anesthesia, initial encounter: T88.59XA

## 2012-03-29 HISTORY — DX: Type 2 diabetes mellitus without complications: E11.9

## 2012-03-29 HISTORY — DX: Adverse effect of unspecified anesthetic, initial encounter: T41.45XA

## 2012-03-29 LAB — BASIC METABOLIC PANEL
BUN: 14 mg/dL (ref 6–23)
CO2: 31 mEq/L (ref 19–32)
Calcium: 10.6 mg/dL — ABNORMAL HIGH (ref 8.4–10.5)
Chloride: 98 mEq/L (ref 96–112)
Creatinine, Ser: 0.83 mg/dL (ref 0.50–1.35)
GFR calc Af Amer: >90 mL/min (ref >90)
GFR calc non Af Amer: 82 mL/min — ABNORMAL LOW (ref >90)
Glucose, Bld: 133 mg/dL — ABNORMAL HIGH (ref 70–99)
Potassium: 4.3 mEq/L (ref 3.5–5.1)
Sodium: 135 mEq/L (ref 135–145)

## 2012-03-29 LAB — CBC
HCT: 46.3 % (ref 39.0–52.0)
Hemoglobin: 16.1 g/dL (ref 13.0–17.0)
MCH: 30.4 pg (ref 26.0–34.0)
MCHC: 34.8 g/dL (ref 30.0–36.0)
MCV: 87.4 fL (ref 78.0–100.0)
Platelets: 181 10*3/uL (ref 150–400)
RBC: 5.3 MIL/uL (ref 4.22–5.81)
RDW: 12.8 % (ref 11.5–15.5)
WBC: 8.5 10*3/uL (ref 4.0–10.5)

## 2012-03-29 LAB — SURGICAL PCR SCREEN
MRSA, PCR: NEGATIVE
Staphylococcus aureus: NEGATIVE

## 2012-03-29 NOTE — Patient Instructions (Signed)
YOUR SURGERY IS SCHEDULED AT [facility]  ON:  Monday  04/04/12  AT 7:30 AM  REPORT TO [facility] AT:  5:30 AM      PHONE # FOR SHORT STAY IS [phone]  DO NOT EAT OR DRINK ANYTHING AFTER MIDNIGHT THE NIGHT BEFORE YOUR SURGERY.  YOU MAY BRUSH YOUR TEETH, RINSE OUT YOUR MOUTH--BUT NO WATER, NO FOOD, NO CHEWING GUM, NO MINTS, NO CANDIES, NO CHEWING TOBACCO.  PLEASE TAKE THE FOLLOWING MEDICATIONS THE AM OF YOUR SURGERY WITH A FEW SIPS OF WATER:   METOPROLOL AND SIMVASTATIN   IF YOU USE INHALERS--USE YOUR INHALERS THE AM OF YOUR SURGERY AND BRING INHALERS TO THE HOSPITAL -TAKE TO SURGERY.    IF YOU ARE DIABETIC:  DO NOT TAKE ANY DIABETIC MEDICATIONS THE AM OF YOUR SURGERY.  IF YOU TAKE INSULIN IN THE EVENINGS--PLEASE ONLY TAKE 1/2 NORMAL EVENING DOSE THE NIGHT BEFORE YOUR SURGERY.  NO INSULIN THE AM OF YOUR SURGERY.  IF YOU HAVE SLEEP APNEA AND USE CPAP OR BIPAP--PLEASE BRING THE MASK AND THE TUBING.  DO NOT BRING YOUR MACHINE.  DO NOT BRING VALUABLES, MONEY, CREDIT CARDS.  DO NOT WEAR JEWELRY, MAKE-UP, NAIL POLISH AND NO METAL PINS OR CLIPS IN YOUR HAIR. CONTACT LENS, DENTURES / PARTIALS, GLASSES SHOULD NOT BE WORN TO SURGERY AND IN MOST CASES-HEARING AIDS WILL NEED TO BE REMOVED.  BRING YOUR GLASSES CASE, ANY EQUIPMENT NEEDED FOR YOUR CONTACT LENS. FOR PATIENTS ADMITTED TO THE HOSPITAL--CHECK OUT TIME THE DAY OF DISCHARGE IS 11:00 AM.  ALL INPATIENT ROOMS ARE PRIVATE - WITH BATHROOM, TELEPHONE, TELEVISION AND WIFI INTERNET.  IF YOU ARE BEING DISCHARGED THE SAME DAY OF YOUR SURGERY--YOU CAN NOT DRIVE YOURSELF HOME--AND SHOULD NOT GO HOME ALONE BY TAXI OR BUS.  NO DRIVING OR OPERATING MACHINERY FOR 24 HOURS FOLLOWING ANESTHESIA / PAIN MEDICATIONS.  PLEASE MAKE ARRANGEMENTS FOR SOMEONE TO BE WITH YOU AT HOME THE FIRST 24 HOURS AFTER SURGERY. RESPONSIBLE DRIVER'S NAME___________________________                                               PHONE #    _______________________                                  PLEASE READ OVER ANY  FACT SHEETS THAT YOU WERE GIVEN: MRSA INFORMATION

## 2012-03-29 NOTE — Pre-Procedure Instructions (Addendum)
CBC, BMET CXR WERE DONE TODAY - PREOP AT [facility] AS PER ANESTHESIOLOGIST'S GUIDELINES EKG REPORT IS IN EPIC-DONE 12/31/11. PERIOPERATIVE PRESCRIPTION FOR IMPLANTED CARDIAC DEVICE PROGRAMMING ON PT'S CHART FROM DR. Rosette Reveal

## 2012-03-29 NOTE — Progress Notes (Signed)
03/29/12 1100  OBSTRUCTIVE SLEEP APNEA  Have you ever been diagnosed with sleep apnea through a sleep study? No  Do you snore loudly (loud enough to be heard through closed doors)?  0  Do you often feel tired, fatigued, or sleepy during the daytime? 0  Has anyone observed you stop breathing during your sleep? 1  Do you have, or are you being treated for high blood pressure? 1  BMI more than 35 kg/m2? 0  Age over 76 years old? 1  Neck circumference greater than 40 cm/18 inches? 0  Gender: 1  Obstructive Sleep Apnea Score 4   Score 4 or greater  Results sent to PCP
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
## 2012-04-04 ENCOUNTER — Ambulatory Visit (HOSPITAL_COMMUNITY)
Admission: RE | Admit: 2012-04-04 | Discharge: 2012-04-05 | Disposition: A | Discharge status: Home / Self Care | Payer: Medicare Other | Source: Ambulatory Visit | Attending: Urology | Admitting: Urology

## 2012-04-04 ENCOUNTER — Encounter (HOSPITAL_COMMUNITY): Payer: Self-pay | Admitting: *Deleted

## 2012-04-04 ENCOUNTER — Encounter (HOSPITAL_COMMUNITY): Payer: Self-pay

## 2012-04-04 ENCOUNTER — Ambulatory Visit (HOSPITAL_COMMUNITY): Payer: Medicare Other | Admitting: *Deleted

## 2012-04-04 ENCOUNTER — Encounter (HOSPITAL_COMMUNITY)
Admission: RE | Disposition: A | Discharge status: Home / Self Care | Payer: Self-pay | Source: Ambulatory Visit | Attending: Urology

## 2012-04-04 DIAGNOSIS — I2510 Atherosclerotic heart disease of native coronary artery without angina pectoris: Secondary | ICD-10-CM | POA: Insufficient documentation

## 2012-04-04 DIAGNOSIS — N320 Bladder-neck obstruction: Secondary | ICD-10-CM | POA: Insufficient documentation

## 2012-04-04 DIAGNOSIS — Z950 Presence of cardiac pacemaker: Secondary | ICD-10-CM | POA: Insufficient documentation

## 2012-04-04 DIAGNOSIS — R339 Retention of urine, unspecified: Secondary | ICD-10-CM | POA: Diagnosis present

## 2012-04-04 DIAGNOSIS — R338 Other retention of urine: Secondary | ICD-10-CM | POA: Insufficient documentation

## 2012-04-04 DIAGNOSIS — I10 Essential (primary) hypertension: Secondary | ICD-10-CM | POA: Insufficient documentation

## 2012-04-04 DIAGNOSIS — N138 Other obstructive and reflux uropathy: Secondary | ICD-10-CM | POA: Insufficient documentation

## 2012-04-04 DIAGNOSIS — Z951 Presence of aortocoronary bypass graft: Secondary | ICD-10-CM | POA: Insufficient documentation

## 2012-04-04 DIAGNOSIS — I739 Peripheral vascular disease, unspecified: Secondary | ICD-10-CM | POA: Insufficient documentation

## 2012-04-04 DIAGNOSIS — E119 Type 2 diabetes mellitus without complications: Secondary | ICD-10-CM | POA: Insufficient documentation

## 2012-04-04 DIAGNOSIS — N401 Enlarged prostate with lower urinary tract symptoms: Secondary | ICD-10-CM | POA: Insufficient documentation

## 2012-04-04 HISTORY — PX: INSERTION OF SUPRAPUBIC CATHETER: SHX5870

## 2012-04-04 HISTORY — PX: TRANSURETHRAL PROSTATECTOMY WITH GYRUS INSTRUMENTS: SHX6153

## 2012-04-04 LAB — GLUCOSE, CAPILLARY
Glucose-Capillary: 144 mg/dL — ABNORMAL HIGH (ref 70–99)
Glucose-Capillary: 160 mg/dL — ABNORMAL HIGH (ref 70–99)

## 2012-04-04 SURGERY — TRANSURETHRAL PROSTATECTOMY WITH GYRUS INSTRUMENTS
Anesthesia: General | Wound class: Clean Contaminated

## 2012-04-04 MED ORDER — VANCOMYCIN HCL 500 MG IV SOLR
500.0000 mg | Freq: Two times a day (BID) | INTRAVENOUS | Status: DC
  Administered 2012-04-04 – 2012-04-05 (×2): 500 mg via INTRAVENOUS
  Filled 2012-04-04 (×4): qty 500

## 2012-04-04 MED ORDER — FENTANYL CITRATE 0.05 MG/ML IJ SOLN: 25.0000 ug | INTRAMUSCULAR | Status: DC | PRN

## 2012-04-04 MED ORDER — PROPOFOL 10 MG/ML IV EMUL
INTRAVENOUS | Status: DC | PRN
  Administered 2012-04-04: 130 mg via INTRAVENOUS

## 2012-04-04 MED ORDER — ZOLPIDEM TARTRATE 5 MG PO TABS
5.0000 mg | ORAL_TABLET | Freq: Every evening | ORAL | Status: DC | PRN
  Administered 2012-04-04: 5 mg via ORAL
  Filled 2012-04-04: qty 1

## 2012-04-04 MED ORDER — SODIUM CHLORIDE 0.9 % IR SOLN
3000.0000 mL | Status: DC
  Administered 2012-04-04: 3000 mL

## 2012-04-04 MED ORDER — ACETAMINOPHEN 10 MG/ML IV SOLN
INTRAVENOUS | Status: DC | PRN
  Administered 2012-04-04: 1000 mg via INTRAVENOUS

## 2012-04-04 MED ORDER — VANCOMYCIN HCL 1000 MG IV SOLR
1000.0000 mg | INTRAVENOUS | Status: DC | PRN
  Administered 2012-04-04: 1000 mg via INTRAVENOUS

## 2012-04-04 MED ORDER — LIDOCAINE HCL (CARDIAC) 20 MG/ML IV SOLN
INTRAVENOUS | Status: DC | PRN
  Administered 2012-04-04: 75 mg via INTRAVENOUS

## 2012-04-04 MED ORDER — CIPROFLOXACIN IN D5W 400 MG/200ML IV SOLN: 400.0000 mg | INTRAVENOUS | Status: DC

## 2012-04-04 MED ORDER — STERILE WATER FOR IRRIGATION IR SOLN
Status: DC | PRN
  Administered 2012-04-04: 1000 mL

## 2012-04-04 MED ORDER — HYDROCODONE-ACETAMINOPHEN 5-325 MG PO TABS
1.0000 | ORAL_TABLET | ORAL | Status: DC | PRN
  Administered 2012-04-04: 1 via ORAL
  Administered 2012-04-05 (×2): 2 via ORAL
  Filled 2012-04-04 (×2): qty 2
  Filled 2012-04-04: qty 1

## 2012-04-04 MED ORDER — 0.9 % SODIUM CHLORIDE (POUR BTL) OPTIME
TOPICAL | Status: DC | PRN
  Administered 2012-04-04: 1000 mL

## 2012-04-04 MED ORDER — FENTANYL CITRATE 0.05 MG/ML IJ SOLN
INTRAMUSCULAR | Status: DC | PRN
  Administered 2012-04-04 (×3): 50 ug via INTRAVENOUS

## 2012-04-04 MED ORDER — EPHEDRINE SULFATE 50 MG/ML IJ SOLN
INTRAMUSCULAR | Status: DC | PRN
  Administered 2012-04-04: 10 mg via INTRAVENOUS
  Administered 2012-04-04: 5 mg via INTRAVENOUS
  Administered 2012-04-04: 10 mg via INTRAVENOUS

## 2012-04-04 MED ORDER — ONDANSETRON HCL 4 MG/2ML IJ SOLN
INTRAMUSCULAR | Status: DC | PRN
  Administered 2012-04-04 (×2): 2 mg via INTRAVENOUS

## 2012-04-04 MED ORDER — SODIUM CHLORIDE 0.9 % IV SOLN
INTRAVENOUS | Status: DC
  Administered 2012-04-04 – 2012-04-05 (×3): via INTRAVENOUS

## 2012-04-04 MED ORDER — CIPROFLOXACIN IN D5W 400 MG/200ML IV SOLN
INTRAVENOUS | Status: DC | PRN
  Administered 2012-04-04: 400 mg via INTRAVENOUS

## 2012-04-04 MED ORDER — MIDAZOLAM HCL 5 MG/5ML IJ SOLN
INTRAMUSCULAR | Status: DC | PRN
  Administered 2012-04-04: .25 mg via INTRAVENOUS

## 2012-04-04 MED ORDER — SODIUM CHLORIDE 0.9 % IR SOLN
Status: DC | PRN
  Administered 2012-04-04: 9000 mL

## 2012-04-04 MED ORDER — ONDANSETRON HCL 4 MG/2ML IJ SOLN: 4.0000 mg | INTRAMUSCULAR | Status: DC | PRN

## 2012-04-04 MED ORDER — ACETAMINOPHEN 325 MG PO TABS: 650.0000 mg | ORAL_TABLET | ORAL | Status: DC | PRN

## 2012-04-04 MED ORDER — BUPIVACAINE-EPINEPHRINE 0.5% -1:200000 IJ SOLN
INTRAMUSCULAR | Status: DC | PRN
  Administered 2012-04-04: 10 mL

## 2012-04-04 MED ORDER — PROMETHAZINE HCL 25 MG/ML IJ SOLN: 6.2500 mg | INTRAMUSCULAR | Status: DC | PRN

## 2012-04-04 MED ORDER — BACITRACIN-NEOMYCIN-POLYMYXIN 400-5-5000 EX OINT
1.0000 "application " | TOPICAL_OINTMENT | Freq: Three times a day (TID) | CUTANEOUS | Status: DC | PRN
  Administered 2012-04-05: 1 via TOPICAL

## 2012-04-04 MED ORDER — CIPROFLOXACIN IN D5W 200 MG/100ML IV SOLN
200.0000 mg | Freq: Two times a day (BID) | INTRAVENOUS | Status: AC
  Administered 2012-04-04: 200 mg via INTRAVENOUS
  Filled 2012-04-04 (×2): qty 100

## 2012-04-04 MED ORDER — LACTATED RINGERS IV SOLN
INTRAVENOUS | Status: DC | PRN
  Administered 2012-04-04: 1000 mL
  Administered 2012-04-04: 07:00:00 via INTRAVENOUS

## 2012-04-04 MED ORDER — BELLADONNA ALKALOIDS-OPIUM 16.2-60 MG RE SUPP: 1.0000 | Freq: Four times a day (QID) | RECTAL | Status: DC | PRN

## 2012-04-04 SURGICAL SUPPLY — 32 items
BAG URINE DRAINAGE (UROLOGICAL SUPPLIES) ×2 IMPLANT
BAG URO CATCHER STRL LF (DRAPE) ×2 IMPLANT
BLADE SURG 15 STRL LF DISP TIS (BLADE) IMPLANT
BLADE SURG 15 STRL SS (BLADE) ×2
CATH FOLEY 2WAY SLVR  5CC 18FR (CATHETERS) ×1
CATH FOLEY 2WAY SLVR 30CC 20FR (CATHETERS) ×1 IMPLANT
CATH FOLEY 2WAY SLVR 5CC 18FR (CATHETERS) IMPLANT
CATH FOLEY 3WAY 30CC 24FR (CATHETERS)
CATH URTH STD 24FR FL 3W 2 (CATHETERS) ×1 IMPLANT
CLOTH BEACON ORANGE TIMEOUT ST (SAFETY) ×2 IMPLANT
DRAPE CAMERA CLOSED 9X96 (DRAPES) ×2 IMPLANT
ELECT BUTTON HF 24-28F 2 30DE (ELECTRODE) ×1 IMPLANT
ELECT HF RESECT BIPO 24F 45 ND (CUTTING LOOP) ×1 IMPLANT
ELECT LOOP MED HF 24F 12D (CUTTING LOOP) ×1 IMPLANT
ELECT LOOP MED HF 24F 12D CBL (CLIP) ×1 IMPLANT
ELECT RESECT VAPORIZE 12D CBL (ELECTRODE) ×2 IMPLANT
EVACUATOR MICROVAS BLADDER (UROLOGICAL SUPPLIES) ×2 IMPLANT
GLOVE BIOGEL M 8.0 STRL (GLOVE) ×2 IMPLANT
GOWN STRL REIN XL XLG (GOWN DISPOSABLE) ×2 IMPLANT
HOLDER FOLEY CATH W/STRAP (MISCELLANEOUS) ×1 IMPLANT
IV NS IRRIG 3000ML ARTHROMATIC (IV SOLUTION) ×4 IMPLANT
KIT ASPIRATION TUBING (SET/KITS/TRAYS/PACK) ×2 IMPLANT
MANIFOLD NEPTUNE II (INSTRUMENTS) ×2 IMPLANT
PACK CYSTO (CUSTOM PROCEDURE TRAY) ×2 IMPLANT
SPONGE GAUZE 4X4 12PLY (GAUZE/BANDAGES/DRESSINGS) ×1 IMPLANT
SUT ETHILON 3 0 PS 1 (SUTURE) ×1 IMPLANT
SYR 30ML LL (SYRINGE) ×1 IMPLANT
SYR CONTROL 10ML LL (SYRINGE) ×2 IMPLANT
SYRINGE IRR TOOMEY STRL 70CC (SYRINGE) ×1 IMPLANT
TAPE CLOTH SURG 4X10 WHT LF (GAUZE/BANDAGES/DRESSINGS) ×1 IMPLANT
TUBING CONNECTING 10 (TUBING) ×2 IMPLANT
WIRE COONS/BENSON .038X145CM (WIRE) ×1 IMPLANT

## 2012-04-04 NOTE — Anesthesia Preprocedure Evaluation (Signed)
Anesthesia Evaluation  Patient identified by MRN, date of birth, ID band Patient awake    Reviewed: Allergy & Precautions, H&P , NPO status , Patient's Chart, lab work & pertinent test results  Airway Mallampati: II TM Distance: <3 FB Neck ROM: Full    Dental No notable dental hx.    Pulmonary neg pulmonary ROS,  breath sounds clear to auscultation  Pulmonary exam normal       Cardiovascular hypertension, Pt. on medications + CAD, + CABG and + Peripheral Vascular Disease + pacemaker Rhythm:Regular Rate:Normal     Neuro/Psych negative neurological ROS  negative psych ROS   GI/Hepatic negative GI ROS, Neg liver ROS,   Endo/Other  diabetes, Type 2  Renal/GU negative Renal ROS  negative genitourinary   Musculoskeletal negative musculoskeletal ROS (+)   Abdominal   Peds negative pediatric ROS (+)  Hematology negative hematology ROS (+)   Anesthesia Other Findings   Reproductive/Obstetrics negative OB ROS                           Anesthesia Physical Anesthesia Plan  ASA: III  Anesthesia Plan: General   Post-op Pain Management:    Induction: Intravenous  Airway Management Planned: LMA  Additional Equipment:   Intra-op Plan:   Post-operative Plan:   Informed Consent: I have reviewed the patients History and Physical, chart, labs and discussed the procedure including the risks, benefits and alternatives for the proposed anesthesia with the patient or authorized representative who has indicated his/her understanding and acceptance.   Dental advisory given  Plan Discussed with: CRNA and Surgeon  Anesthesia Plan Comments:         Anesthesia Quick Evaluation

## 2012-04-04 NOTE — Transfer of Care (Signed)
Immediate Anesthesia Transfer of Care Note  Patient: Guy Roberson  Procedure(s) Performed: Procedure(s) (LRB) with comments: TRANSURETHRAL PROSTATECTOMY WITH GYRUS INSTRUMENTS (N/A) - TURP with Gyrus and Suprapubic Tube Placement INSERTION OF SUPRAPUBIC CATHETER (N/A)  Patient Location: PACU  Anesthesia Type:General  Level of Consciousness: awake, alert , oriented and patient cooperative  Airway & Oxygen Therapy: Patient Spontanous Breathing and Patient connected to face mask oxygen  Post-op Assessment: Report given to PACU RN, Post -op Vital signs reviewed and stable and Patient moving all extremities  Post vital signs: Reviewed and stable  Complications: No apparent anesthesia complications

## 2012-04-04 NOTE — Interval H&P Note (Signed)
History and Physical Interval Note:  04/04/2012 7:01 AM  Guy Roberson  has presented today for surgery, with the diagnosis of BPH (benign prostatic hypertrophy) with urinary obstruction   The various methods of treatment have been discussed with the patient and family. After consideration of risks, benefits and other options for treatment, the patient has consented to  Procedure(s) (LRB) with comments: TRANSURETHRAL PROSTATECTOMY WITH GYRUS INSTRUMENTS (N/A) - TURP with Gyrus and Suprapubic Tube Placement INSERTION OF SUPRAPUBIC CATHETER (N/A) as a surgical intervention .  The patient's history has been reviewed, patient examined, no change in status, stable for surgery.  I have reviewed the patient's chart and labs.  Questions were answered to the patient's satisfaction.     Garnett Farm

## 2012-04-04 NOTE — Preoperative (Signed)
Beta Blockers   Reason not to administer Beta Blockers:Not Applicablept took beta blocker this a.m. Required ephedrine intraop

## 2012-04-04 NOTE — Progress Notes (Signed)
Patient ID: Guy Roberson, male   DOB: 1932-05-09, 78 y.o.   MRN: [id number] Pt. without complaint.  SP tube site looks good with dry dressing. Foley with clear urine/CBI. No clots.  Doing very well. Discussed use of SP tube to monitor PVR as outpatient and that his foley will be removed in the AM.  Plan: Per orders.

## 2012-04-04 NOTE — Anesthesia Postprocedure Evaluation (Signed)
  Anesthesia Post-op Note  Patient: Guy Roberson  Procedure(s) Performed: Procedure(s) (LRB): TRANSURETHRAL PROSTATECTOMY WITH GYRUS INSTRUMENTS (N/A) INSERTION OF SUPRAPUBIC CATHETER (N/A)  Patient Location: PACU  Anesthesia Type: General  Level of Consciousness: awake and alert   Airway and Oxygen Therapy: Patient Spontanous Breathing  Post-op Pain: mild  Post-op Assessment: Post-op Vital signs reviewed, Patient's Cardiovascular Status Stable, Respiratory Function Stable, Patent Airway and No signs of Nausea or vomiting  Post-op Vital Signs: stable  Complications: No apparent anesthesia complications
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
## 2012-04-05 ENCOUNTER — Encounter (HOSPITAL_COMMUNITY): Payer: Self-pay | Admitting: Urology

## 2012-04-05 MED ORDER — HYDROCODONE-ACETAMINOPHEN 5-325 MG PO TABS: 1.0000 | ORAL_TABLET | ORAL | Status: DC | PRN

## 2012-04-05 MED ORDER — HYDROCODONE-ACETAMINOPHEN 10-325 MG PO TABS: 1.0000 | ORAL_TABLET | Freq: Four times a day (QID) | ORAL | Status: DC | PRN

## 2012-04-05 MED ORDER — MAGNESIUM HYDROXIDE 400 MG/5ML PO SUSP
30.0000 mL | Freq: Every day | ORAL | Status: DC
  Administered 2012-04-05: 30 mL via ORAL
  Filled 2012-04-05: qty 30

## 2012-04-05 MED ORDER — NITROFURANTOIN MONOHYD MACRO 100 MG PO CAPS: 100.0000 mg | ORAL_CAPSULE | Freq: Two times a day (BID) | ORAL | Status: DC

## 2012-04-05 NOTE — Progress Notes (Addendum)
The foley catheter was discontinued at 0510. There was dried blood at the meatus. The patient was cleaned and the patient stated that he felt like the catheter removal went well. Will continue to monitor the patient.  The patient c/o irritation at the meatus. An application of Neomycin-Bacitracin antibiotic ointment was applied to the meatus.

## 2012-04-05 NOTE — Progress Notes (Signed)
Pt instructed on how how to check post void residuals by unplugging suprapubic catheter after voiding through penis and recording output. Given urinal to measure residual output. Julio Sicks RN

## 2012-04-05 NOTE — Discharge Summary (Signed)
Physician Discharge Summary  Patient ID: Guy Roberson MRN: [id number] DOB/AGE: 1931/10/03 78 y.o.  Admit date: 04/04/2012 Discharge date: 04/05/2012  Admission Diagnoses: BPH benign prostatic hypertrophy with urinary obstruction   Discharge Diagnoses:  Active Problems:  Urinary retention   Discharged Condition: good  Hospital Course: The patientWas admitted to the hospital electively for transurethral resection of his prostate and bladder neck as well as suprapubic tube placement. He was taken to the OR and underwent this procedure without complication. Overnight he was observed with no significant bleeding from his catheter. He was having minimal discomfort and his catheter was removed and he has voided a small amount of slightly pink urine with no clots. He has not had a bowel movement recently and therefore will be given laxatives prior to discharge.  Consults: none  Significant Diagnostic Studies: No results found.  Treatments: As above  Discharge Exam: Blood pressure 131/74, pulse 82, temperature 98.2 F (36.8 C), temperature source Oral, resp. rate 18, height 5\' 7"  (1.702 m), weight 74 kg (163 lb 2.3 oz), SpO2 99.00%. his suprapubic tube site appears to be free of drainage or signs of infection. His abdomen is soft and nontender.  Disposition: 01-Home or Self Care  Discharge Orders    Future Appointments: Provider: Department: Dept Phone: Center:   04/21/2012 9:15 AM Marinus Maw, MD [facility] [place] [phone] [id number]     Future Orders Please Complete By Expires   Nursing communication      Comments:   Please insert a catheter plug in his suprapubic tube and instructed patient how to drain his bladder with the tube in order to check post void residuals at home. Have him record these and the amount urinated each time.   Discharge patient          Medication List     As of 04/05/2012  7:15 AM    STOP taking these medications        ciprofloxacin 500 MG tablet   Commonly known as: CIPRO      TAKE these medications         aspirin EC 81 MG tablet   Take 81 mg by mouth every morning.      hydrochlorothiazide 25 MG tablet   Commonly known as: HYDRODIURIL   Take 25 mg by mouth every morning.      HYDROcodone-acetaminophen 5-325 MG per tablet   Commonly known as: NORCO/VICODIN   Take 1-2 tablets by mouth every 4 (four) hours as needed.      HYDROcodone-acetaminophen 10-325 MG per tablet   Commonly known as: NORCO   Take 1-2 tablets by mouth every 6 (six) hours as needed for pain.      lisinopril 20 MG tablet   Commonly known as: PRINIVIL,ZESTRIL   Take 20 mg by mouth 2 (two) times daily.      metoprolol 100 MG tablet   Commonly known as: LOPRESSOR   Take 50 mg by mouth 2 (two) times daily. 1/2 tab am and pm      nitrofurantoin (macrocrystal-monohydrate) 100 MG capsule   Commonly known as: MACROBID   Take 1 capsule (100 mg total) by mouth 2 (two) times daily.      potassium chloride SA 20 MEQ tablet   Commonly known as: K-DUR,KLOR-CON   Take 20 mEq by mouth every morning.      simvastatin 20 MG tablet   Commonly known as: ZOCOR   Take 20 mg by mouth every morning.  temazepam 30 MG capsule   Commonly known as: RESTORIL   Take 30 mg by mouth at bedtime. For sleep           Follow-up Information    Follow up with Garnett Farm, MD. On 04/11/2012. (at 10:15)    Contact information:   [address] [place] [address] [phone]          Signed: Garnett Farm 04/05/2012, 7:15 AM

## 2012-04-13 ENCOUNTER — Encounter: Payer: Medicare Other | Admitting: Internal Medicine

## 2012-04-21 ENCOUNTER — Ambulatory Visit (INDEPENDENT_AMBULATORY_CARE_PROVIDER_SITE_OTHER): Payer: Medicare Other | Admitting: Internal Medicine

## 2012-04-21 ENCOUNTER — Encounter: Payer: Self-pay | Admitting: Internal Medicine

## 2012-04-21 VITALS — BP 120/68 | HR 64 | Ht 67.0 in | Wt 159.0 lb

## 2012-04-21 DIAGNOSIS — I2510 Atherosclerotic heart disease of native coronary artery without angina pectoris: Secondary | ICD-10-CM

## 2012-04-21 DIAGNOSIS — I442 Atrioventricular block, complete: Secondary | ICD-10-CM

## 2012-04-21 DIAGNOSIS — Z950 Presence of cardiac pacemaker: Secondary | ICD-10-CM

## 2012-04-21 DIAGNOSIS — R55 Syncope and collapse: Secondary | ICD-10-CM

## 2012-04-21 LAB — PACEMAKER DEVICE OBSERVATION
AL AMPLITUDE: 4 mv
AL THRESHOLD: 0.5 V
BAMS-0001: 145 {beats}/min
RV LEAD AMPLITUDE: 15.67 mv
RV LEAD IMPEDENCE PM: 755 Ohm
RV LEAD THRESHOLD: 1 V
VENTRICULAR PACING PM: 80

## 2012-04-21 NOTE — Patient Instructions (Addendum)
Your physician recommends that you schedule a follow-up appointment in: 9 months  

## 2012-04-25 ENCOUNTER — Encounter: Payer: Self-pay | Admitting: Internal Medicine

## 2012-04-25 NOTE — Assessment & Plan Note (Signed)
Since his PPM was placed, he has had no recurrent syncope. Will follow.

## 2012-04-25 NOTE — Progress Notes (Signed)
HPI Guy Roberson returns today for followup. He is a pleasant 78 yo man with a h/o Stokes Adams attacks and documented pauses of over 10 seconds. The patient also has a h/o BPH and is s/p prostatectomy with indwelling suprapubic catheter. The patient has done well. No chest pain or sob and no syncope since his PPM was placed by Dr. Johney Frame. Allergies  Allergen Reactions  . Penicillins Other (See Comments)    Childhood allergy     Current Outpatient Prescriptions  Medication Sig Dispense Refill  . aspirin EC 81 MG tablet Take 81 mg by mouth every morning.      . hydrochlorothiazide (HYDRODIURIL) 25 MG tablet Take 25 mg by mouth every morning.       Marland Kitchen lisinopril (PRINIVIL,ZESTRIL) 20 MG tablet Take 20 mg by mouth 2 (two) times daily.       . metoprolol (LOPRESSOR) 100 MG tablet Take 50 mg by mouth 2 (two) times daily. 1/2 tab am and pm      . potassium chloride SA (K-DUR,KLOR-CON) 20 MEQ tablet Take 20 mEq by mouth every morning.       . simvastatin (ZOCOR) 20 MG tablet Take 20 mg by mouth every morning.       . temazepam (RESTORIL) 30 MG capsule Take 30 mg by mouth at bedtime. For sleep         Past Medical History  Diagnosis Date  . CAD (coronary artery disease)     a.  90% LAD stenosis in 1995 treated with PTCA;  b. 09/2009 CABG x 4: LIMA->LAD, VG->Diag, VG->OM, VG->PDA;  c. 11/2011 Cath: 3VD, 4/4 patent grafts, EF 70%.  . Hyperlipidemia   . Fasting hyperglycemia   . Hypertension   . Colonic polyp   . Trauma 1952    Abdominal and chest in 1952; multiple injuries including pelvic fracture, rib fractures and ruptured bladder  . Syncope     a. 11/2011 18 second pauses noted on Event Monitor assoc w/ syncope  . Complete heart block     a. 11/2011 s/p MDT Adapta L ADDRL 1 Ser # [id number] H.  . Seizures     a. prior to diagnosis of heart block and syncope, this was in the differential and pt was briefly on Keppra, started by ER MD.  . Urinary retention     PT HAS INDWELLING FOLEY CATHETER  .  Pacemaker JULY 2013    DR.  G.TAYLOR AND DR. Tommie Sams  . Complication of anesthesia     PROBLEMS VOIDING AFTER PREVIOUS SURGERIES  . Diabetes mellitus without complication     NOT ON ANY DIABETIC MEDICATIONS    ROS:   All systems reviewed and negative except as noted in the HPI.   Past Surgical History  Procedure Date  . Femoral hernia repair   . Bladder surgery 1950'S  . Coronary artery bypass graft 09/2009  . Colonoscopy w/ polypectomy   . Pacemaker placement   . Transurethral prostatectomy with gyrus instruments 04/04/2012    Procedure: TRANSURETHRAL PROSTATECTOMY WITH GYRUS INSTRUMENTS;  Surgeon: Garnett Farm, MD;  Location: [facility];  Service: Urology;  Laterality: N/A;  TURP with Gyrus and Suprapubic Tube Placement  . Insertion of suprapubic catheter 04/04/2012    Procedure: INSERTION OF SUPRAPUBIC CATHETER;  Surgeon: Garnett Farm, MD;  Location: [facility];  Service: Urology;  Laterality: N/A;     Family History  Problem Relation Age of Onset  . Coronary artery disease Brother     x3  .  Lung cancer Brother   . Heart block Mother   . Heart murmur Mother   . Aneurysm Father      History   Social History  . Marital Status: Married    Spouse Name: N/A    Number of Children: N/A  . Years of Education: N/A   Occupational History  . Retired    Social History Main Topics  . Smoking status: Never Smoker   . Smokeless tobacco: Current User    Types: Chew  . Alcohol Use: No  . Drug Use: Not on file  . Sexually Active: Yes   Other Topics Concern  . Not on file   Social History Narrative  . No narrative on file     BP 120/68  Pulse 64  Ht 5\' 7"  (1.702 m)  Wt 159 lb (72.122 kg)  BMI 24.90 kg/m2  Physical Exam:  Well appearing 78 yo man, NAD HEENT: Unremarkable Neck:  No JVD, no thyromegally Lungs:  Clear with no wheezes, rales, or rhonchi. HEART:  Regular rate rhythm, no murmurs, no rubs, no clicks Abd:  soft, positive bowel sounds, no organomegally,  no rebound, no guarding Ext:  2 plus pulses, no edema, no cyanosis, no clubbing Skin:  No rashes no nodules Neuro:  CN II through XII intact, motor grossly intact  DEVICE  Normal device function.  See PaceArt for details.   Assess/Plan:

## 2012-04-25 NOTE — Assessment & Plan Note (Signed)
He has had no anginal symptoms. He will continue his current meds.  

## 2012-04-25 NOTE — Assessment & Plan Note (Signed)
His Medtronic dual chamber PPM is working normally. Will recheck in several months.

## 2012-05-13 ENCOUNTER — Encounter: Payer: Self-pay | Admitting: Internal Medicine

## 2012-11-16 ENCOUNTER — Encounter (HOSPITAL_COMMUNITY): Payer: Self-pay | Admitting: Pharmacy Technician

## 2012-11-17 ENCOUNTER — Encounter (HOSPITAL_COMMUNITY): Payer: Self-pay

## 2012-11-17 ENCOUNTER — Other Ambulatory Visit: Payer: Self-pay

## 2012-11-17 ENCOUNTER — Encounter (HOSPITAL_COMMUNITY)
Admission: RE | Admit: 2012-11-17 | Discharge: 2012-11-17 | Disposition: A | Discharge status: Home / Self Care | Payer: Medicare Other | Source: Ambulatory Visit | Attending: Ophthalmology | Admitting: Ophthalmology

## 2012-11-17 LAB — BASIC METABOLIC PANEL
BUN: 18 mg/dL (ref 6–23)
Calcium: 10.5 mg/dL (ref 8.4–10.5)
Creatinine, Ser: 1.04 mg/dL (ref 0.50–1.35)
GFR calc Af Amer: 77 mL/min — ABNORMAL LOW (ref >90)
GFR calc non Af Amer: 67 mL/min — ABNORMAL LOW (ref >90)
Glucose, Bld: 154 mg/dL — ABNORMAL HIGH (ref 70–99)
Potassium: 4.8 mEq/L (ref 3.5–5.1)

## 2012-11-17 NOTE — Patient Instructions (Addendum)
Your procedure is scheduled on: 11/22/2012  Report to [facility] at  730  AM.  Call this number if you have problems the morning of surgery: [phone]   Do not eat food or drink liquids :After Midnight.      Take these medicines the morning of surgery with A SIP OF WATER: hydrodiuril, lopresor   Do not wear jewelry, make-up or nail polish.  Do not wear lotions, powders, or perfumes.   Do not shave 48 hours prior to surgery.  Do not bring valuables to the hospital.  Contacts, dentures or bridgework may not be worn into surgery.  Leave suitcase in the car. After surgery it may be brought to your room.  For patients admitted to the hospital, checkout time is 11:00 AM the day of discharge.   Patients discharged the day of surgery will not be allowed to drive home.  :     Please read over the following fact sheets that you were given: Coughing and Deep Breathing, Surgical Site Infection Prevention, Anesthesia Post-op Instructions and Care and Recovery After Surgery    Cataract A cataract is a clouding of the lens of the eye. When a lens becomes cloudy, vision is reduced based on the degree and nature of the clouding. Many cataracts reduce vision to some degree. Some cataracts make people more near-sighted as they develop. Other cataracts increase glare. Cataracts that are ignored and become worse can sometimes look white. The white color can be seen through the pupil. CAUSES   Aging. However, cataracts may occur at any age, even in newborns.   Certain drugs.   Trauma to the eye.   Certain diseases such as diabetes.   Specific eye diseases such as chronic inflammation inside the eye or a sudden attack of a rare form of glaucoma.   Inherited or acquired medical problems.  SYMPTOMS   Gradual, progressive drop in vision in the affected eye.   Severe, rapid visual loss. This most often happens when trauma is the cause.  DIAGNOSIS  To detect a cataract, an eye doctor examines the  lens. Cataracts are best diagnosed with an exam of the eyes with the pupils enlarged (dilated) by drops.  TREATMENT  For an early cataract, vision may improve by using different eyeglasses or stronger lighting. If that does not help your vision, surgery is the only effective treatment. A cataract needs to be surgically removed when vision loss interferes with your everyday activities, such as driving, reading, or watching TV. A cataract may also have to be removed if it prevents examination or treatment of another eye problem. Surgery removes the cloudy lens and usually replaces it with a substitute lens (intraocular lens, IOL).  At a time when both you and your doctor agree, the cataract will be surgically removed. If you have cataracts in both eyes, only one is usually removed at a time. This allows the operated eye to heal and be out of danger from any possible problems after surgery (such as infection or poor wound healing). In rare cases, a cataract may be doing damage to your eye. In these cases, your caregiver may advise surgical removal right away. The vast majority of people who have cataract surgery have better vision afterward. HOME CARE INSTRUCTIONS  If you are not planning surgery, you may be asked to do the following:  Use different eyeglasses.   Use stronger or brighter lighting.   Ask your eye doctor about reducing your medicine dose or changing medicines  if it is thought that a medicine caused your cataract. Changing medicines does not make the cataract go away on its own.   Become familiar with your surroundings. Poor vision can lead to injury. Avoid bumping into things on the affected side. You are at a higher risk for tripping or falling.   Exercise extreme care when driving or operating machinery.   Wear sunglasses if you are sensitive to bright light or experiencing problems with glare.  SEEK IMMEDIATE MEDICAL CARE IF:   You have a worsening or sudden vision loss.   You  notice redness, swelling, or increasing pain in the eye.   You have a fever.  Document Released: 05/18/2005 Document Revised: 05/07/2011 Document Reviewed: 01/09/2011 [facility] Patient Information 2012 [place].PATIENT INSTRUCTIONS POST-ANESTHESIA  IMMEDIATELY FOLLOWING SURGERY:  Do not drive or operate machinery for the first twenty four hours after surgery.  Do not make any important decisions for twenty four hours after surgery or while taking narcotic pain medications or sedatives.  If you develop intractable nausea and vomiting or a severe headache please notify your doctor immediately.  FOLLOW-UP:  Please make an appointment with your surgeon as instructed. You do not need to follow up with anesthesia unless specifically instructed to do so.  WOUND CARE INSTRUCTIONS (if applicable):  Keep a dry clean dressing on the anesthesia/puncture wound site if there is drainage.  Once the wound has quit draining you may leave it open to air.  Generally you should leave the bandage intact for twenty four hours unless there is drainage.  If the epidural site drains for more than 36-48 hours please call the anesthesia department.  QUESTIONS?:  Please feel free to call your physician or the hospital operator if you have any questions, and they will be happy to assist you.

## 2012-11-21 ENCOUNTER — Other Ambulatory Visit: Payer: Self-pay | Admitting: Ophthalmology

## 2012-11-21 MED ORDER — FLURBIPROFEN SODIUM 0.03 % OP SOLN
OPHTHALMIC | Status: AC
  Filled 2012-11-21: qty 2.5

## 2012-11-21 MED ORDER — LIDOCAINE HCL 3.5 % OP GEL: 1.0000 "application " | Freq: Once | OPHTHALMIC | Status: DC

## 2012-11-21 MED ORDER — CYCLOPENTOLATE-PHENYLEPHRINE 0.2-1 % OP SOLN
OPHTHALMIC | Status: AC
  Filled 2012-11-21: qty 2

## 2012-11-21 MED ORDER — TETRACAINE HCL 0.5 % OP SOLN
OPHTHALMIC | Status: AC
  Filled 2012-11-21: qty 2

## 2012-11-22 ENCOUNTER — Ambulatory Visit (HOSPITAL_COMMUNITY)
Admission: RE | Admit: 2012-11-22 | Discharge: 2012-11-22 | Disposition: A | Discharge status: Home / Self Care | Payer: Medicare Other | Source: Ambulatory Visit | Attending: Ophthalmology | Admitting: Ophthalmology

## 2012-11-22 ENCOUNTER — Ambulatory Visit (HOSPITAL_COMMUNITY): Payer: Medicare Other | Admitting: Anesthesiology

## 2012-11-22 ENCOUNTER — Encounter (HOSPITAL_COMMUNITY): Payer: Self-pay | Admitting: Anesthesiology

## 2012-11-22 ENCOUNTER — Encounter (HOSPITAL_COMMUNITY): Payer: Self-pay | Admitting: *Deleted

## 2012-11-22 ENCOUNTER — Encounter (HOSPITAL_COMMUNITY)
Admission: RE | Disposition: A | Discharge status: Home / Self Care | Payer: Self-pay | Source: Ambulatory Visit | Attending: Ophthalmology

## 2012-11-22 DIAGNOSIS — H2510 Age-related nuclear cataract, unspecified eye: Secondary | ICD-10-CM | POA: Insufficient documentation

## 2012-11-22 DIAGNOSIS — Z79899 Other long term (current) drug therapy: Secondary | ICD-10-CM | POA: Insufficient documentation

## 2012-11-22 DIAGNOSIS — E119 Type 2 diabetes mellitus without complications: Secondary | ICD-10-CM | POA: Insufficient documentation

## 2012-11-22 DIAGNOSIS — I10 Essential (primary) hypertension: Secondary | ICD-10-CM | POA: Insufficient documentation

## 2012-11-22 DIAGNOSIS — Z01810 Encounter for preprocedural cardiovascular examination: Secondary | ICD-10-CM | POA: Insufficient documentation

## 2012-11-22 DIAGNOSIS — Z951 Presence of aortocoronary bypass graft: Secondary | ICD-10-CM | POA: Insufficient documentation

## 2012-11-22 DIAGNOSIS — Z01812 Encounter for preprocedural laboratory examination: Secondary | ICD-10-CM | POA: Insufficient documentation

## 2012-11-22 HISTORY — PX: CATARACT EXTRACTION W/PHACO: SHX586

## 2012-11-22 SURGERY — PHACOEMULSIFICATION, CATARACT, WITH IOL INSERTION
Anesthesia: Monitor Anesthesia Care | Site: Eye | Laterality: Right | Wound class: Clean

## 2012-11-22 MED ORDER — BSS IO SOLN
INTRAOCULAR | Status: DC | PRN
  Administered 2012-11-22: 15 mL via INTRAOCULAR

## 2012-11-22 MED ORDER — PROVISC 10 MG/ML IO SOLN
INTRAOCULAR | Status: DC | PRN
  Administered 2012-11-22: 8.5 mg via INTRAOCULAR

## 2012-11-22 MED ORDER — ONDANSETRON HCL 4 MG/2ML IJ SOLN: 4.0000 mg | Freq: Once | INTRAMUSCULAR | Status: DC | PRN

## 2012-11-22 MED ORDER — PHENYLEPHRINE HCL 2.5 % OP SOLN
1.0000 [drp] | OPHTHALMIC | Status: AC
  Administered 2012-11-22 (×3): 1 [drp] via OPHTHALMIC

## 2012-11-22 MED ORDER — EPINEPHRINE HCL 1 MG/ML IJ SOLN
INTRAMUSCULAR | Status: AC
  Filled 2012-11-22: qty 1

## 2012-11-22 MED ORDER — LACTATED RINGERS IV SOLN
INTRAVENOUS | Status: DC
  Administered 2012-11-22: 1000 mL via INTRAVENOUS

## 2012-11-22 MED ORDER — MIDAZOLAM HCL 2 MG/2ML IJ SOLN
1.0000 mg | INTRAMUSCULAR | Status: DC | PRN
  Administered 2012-11-22: 2 mg via INTRAVENOUS

## 2012-11-22 MED ORDER — EPINEPHRINE HCL 1 MG/ML IJ SOLN
INTRAOCULAR | Status: DC | PRN
  Administered 2012-11-22: 09:00:00

## 2012-11-22 MED ORDER — CYCLOPENTOLATE-PHENYLEPHRINE 0.2-1 % OP SOLN
1.0000 [drp] | OPHTHALMIC | Status: AC
  Administered 2012-11-22 (×3): 1 [drp] via OPHTHALMIC

## 2012-11-22 MED ORDER — TETRACAINE 0.5 % OP SOLN OPTIME - NO CHARGE
OPHTHALMIC | Status: DC | PRN
  Administered 2012-11-22: 2 [drp] via OPHTHALMIC

## 2012-11-22 MED ORDER — MIDAZOLAM HCL 2 MG/2ML IJ SOLN
INTRAMUSCULAR | Status: AC
  Filled 2012-11-22: qty 2

## 2012-11-22 MED ORDER — FENTANYL CITRATE 0.05 MG/ML IJ SOLN: 25.0000 ug | INTRAMUSCULAR | Status: DC | PRN

## 2012-11-22 MED ORDER — PHENYLEPHRINE HCL 2.5 % OP SOLN
OPHTHALMIC | Status: AC
  Filled 2012-11-22: qty 15

## 2012-11-22 MED ORDER — TETRACAINE HCL 0.5 % OP SOLN
1.0000 [drp] | OPHTHALMIC | Status: AC
  Administered 2012-11-22 (×3): 1 [drp] via OPHTHALMIC

## 2012-11-22 MED ORDER — FLURBIPROFEN SODIUM 0.03 % OP SOLN
1.0000 [drp] | OPHTHALMIC | Status: AC
  Administered 2012-11-22 (×3): 1 [drp] via OPHTHALMIC

## 2012-11-22 SURGICAL SUPPLY — 23 items

## 2012-11-22 NOTE — Anesthesia Procedure Notes (Signed)
Procedure Name: MAC Date/Time: 11/22/2012 8:51 AM Performed by: Carolyne Littles, Cerrone Debold L Pre-anesthesia Checklist: Patient identified, Timeout performed, Emergency Drugs available, Suction available and Patient being monitored Patient Re-evaluated:Patient Re-evaluated prior to inductionOxygen Delivery Method: Nasal cannula

## 2012-11-22 NOTE — Anesthesia Preprocedure Evaluation (Signed)
Anesthesia Evaluation  Patient identified by MRN, date of birth, ID band Patient awake    Reviewed: Allergy & Precautions, H&P , NPO status , Patient's Chart, lab work & pertinent test results  Airway Mallampati: II TM Distance: <3 FB Neck ROM: Full    Dental no notable dental hx.    Pulmonary neg pulmonary ROS,  breath sounds clear to auscultation  Pulmonary exam normal       Cardiovascular hypertension, Pt. on medications + CAD, + CABG and + Peripheral Vascular Disease + pacemaker Rhythm:Regular Rate:Normal     Neuro/Psych Seizures -, Well Controlled,  negative neurological ROS  negative psych ROS   GI/Hepatic negative GI ROS, Neg liver ROS,   Endo/Other  diabetes, Type 2  Renal/GU negative Renal ROS  negative genitourinary   Musculoskeletal negative musculoskeletal ROS (+)   Abdominal   Peds negative pediatric ROS (+)  Hematology negative hematology ROS (+)   Anesthesia Other Findings   Reproductive/Obstetrics negative OB ROS                           Anesthesia Physical Anesthesia Plan  ASA: III  Anesthesia Plan: MAC   Post-op Pain Management:    Induction: Intravenous  Airway Management Planned: Nasal Cannula  Additional Equipment:   Intra-op Plan:   Post-operative Plan:   Informed Consent: I have reviewed the patients History and Physical, chart, labs and discussed the procedure including the risks, benefits and alternatives for the proposed anesthesia with the patient or authorized representative who has indicated his/her understanding and acceptance.     Plan Discussed with:   Anesthesia Plan Comments:         Anesthesia Quick Evaluation

## 2012-11-22 NOTE — Op Note (Signed)
Patient brought to the operating room and prepped and draped in the usual manner.  Lid speculum inserted in right eye.  Stab incision made at the twelve o'clock position.  Provisc instilled in the anterior chamber.   A 2.4 mm. Stab incision was made temporally.  An anterior capsulotomy was done with a bent 25 gauge needle.  The nucleus was hydrodissected.  The Phaco tip was inserted in the anterior chamber and the nucleus was emulsified.  CDE was 10.27.  The cortical material was then removed with the I and A tip.  Posterior capsule was the polished.  The anterior chamber was deepened with Provisc.  A 23.5 Abbott [id number] Technis Multifocal IOL was then inserted in the capsular bag.  Provisc was then removed with the I and A tip.  The wound was then hydrated.  Patient sent to the Recovery Room in good condition with follow up in my office.  Preoperative Diagnosis:  Nuclear Cataract OD Postoperative Diagnosis:  Same Procedure name: Kelman Phacoemulsification OD with IOL

## 2012-11-22 NOTE — H&P (Signed)
The patient was re examined and there is no change in the patients condition since the original H and P. 

## 2012-11-22 NOTE — Anesthesia Postprocedure Evaluation (Signed)
  Anesthesia Post-op Note  Patient: Guy Roberson  Procedure(s) Performed: Procedure(s) with comments: CATARACT EXTRACTION PHACO AND INTRAOCULAR LENS PLACEMENT (IOC) (Right) - CDE 10.27  Patient Location: Short Stay  Anesthesia Type:MAC  Level of Consciousness: awake, alert , oriented and patient cooperative  Airway and Oxygen Therapy: Patient Spontanous Breathing  Post-op Pain: none  Post-op Assessment: Post-op Vital signs reviewed, Patient's Cardiovascular Status Stable, Respiratory Function Stable, Patent Airway, No signs of Nausea or vomiting and Pain level controlled  Post-op Vital Signs: Reviewed and stable  Complications: No apparent anesthesia complications

## 2012-11-22 NOTE — Transfer of Care (Signed)
Immediate Anesthesia Transfer of Care Note  Patient: Guy Roberson  Procedure(s) Performed: Procedure(s) with comments: CATARACT EXTRACTION PHACO AND INTRAOCULAR LENS PLACEMENT (IOC) (Right) - CDE 10.27  Patient Location: Short Stay  Anesthesia Type:MAC  Level of Consciousness: awake, alert , oriented and patient cooperative  Airway & Oxygen Therapy: Patient Spontanous Breathing  Post-op Assessment: Report given to PACU RN and Post -op Vital signs reviewed and stable  Post vital signs: Reviewed and stable  Complications: No apparent anesthesia complications

## 2012-11-24 ENCOUNTER — Encounter (HOSPITAL_COMMUNITY): Payer: Self-pay | Admitting: Ophthalmology

## 2012-12-08 ENCOUNTER — Encounter (HOSPITAL_COMMUNITY): Payer: Self-pay | Admitting: Pharmacy Technician

## 2012-12-14 ENCOUNTER — Encounter (HOSPITAL_COMMUNITY)
Admission: RE | Admit: 2012-12-14 | Discharge: 2012-12-14 | Disposition: A | Discharge status: Home / Self Care | Payer: Medicare Other | Source: Ambulatory Visit | Attending: Ophthalmology | Admitting: Ophthalmology

## 2012-12-14 MED ORDER — FLURBIPROFEN SODIUM 0.03 % OP SOLN: 1.0000 [drp] | OPHTHALMIC | Status: AC

## 2012-12-19 MED ORDER — CYCLOPENTOLATE-PHENYLEPHRINE 0.2-1 % OP SOLN
OPHTHALMIC | Status: AC
  Filled 2012-12-19: qty 2

## 2012-12-19 MED ORDER — TETRACAINE HCL 0.5 % OP SOLN
OPHTHALMIC | Status: AC
  Filled 2012-12-19: qty 2

## 2012-12-19 MED ORDER — FLURBIPROFEN SODIUM 0.03 % OP SOLN
OPHTHALMIC | Status: AC
  Filled 2012-12-19: qty 2.5

## 2012-12-20 ENCOUNTER — Encounter (HOSPITAL_COMMUNITY): Payer: Self-pay | Admitting: Anesthesiology

## 2012-12-20 ENCOUNTER — Ambulatory Visit (HOSPITAL_COMMUNITY)
Admission: RE | Admit: 2012-12-20 | Discharge: 2012-12-20 | Disposition: A | Discharge status: Home Health | Payer: Medicare Other | Source: Ambulatory Visit | Attending: Ophthalmology | Admitting: Ophthalmology

## 2012-12-20 ENCOUNTER — Ambulatory Visit (HOSPITAL_COMMUNITY): Payer: Medicare Other | Admitting: Anesthesiology

## 2012-12-20 ENCOUNTER — Encounter (HOSPITAL_COMMUNITY)
Admission: RE | Disposition: A | Discharge status: Home Health | Payer: Self-pay | Source: Ambulatory Visit | Attending: Ophthalmology

## 2012-12-20 ENCOUNTER — Encounter (HOSPITAL_COMMUNITY): Payer: Self-pay | Admitting: *Deleted

## 2012-12-20 DIAGNOSIS — G40909 Epilepsy, unspecified, not intractable, without status epilepticus: Secondary | ICD-10-CM | POA: Insufficient documentation

## 2012-12-20 DIAGNOSIS — Z79899 Other long term (current) drug therapy: Secondary | ICD-10-CM | POA: Insufficient documentation

## 2012-12-20 DIAGNOSIS — I739 Peripheral vascular disease, unspecified: Secondary | ICD-10-CM | POA: Insufficient documentation

## 2012-12-20 DIAGNOSIS — I10 Essential (primary) hypertension: Secondary | ICD-10-CM | POA: Insufficient documentation

## 2012-12-20 DIAGNOSIS — I2510 Atherosclerotic heart disease of native coronary artery without angina pectoris: Secondary | ICD-10-CM | POA: Insufficient documentation

## 2012-12-20 DIAGNOSIS — H2510 Age-related nuclear cataract, unspecified eye: Secondary | ICD-10-CM | POA: Insufficient documentation

## 2012-12-20 DIAGNOSIS — E119 Type 2 diabetes mellitus without complications: Secondary | ICD-10-CM | POA: Insufficient documentation

## 2012-12-20 DIAGNOSIS — I498 Other specified cardiac arrhythmias: Secondary | ICD-10-CM | POA: Insufficient documentation

## 2012-12-20 HISTORY — PX: CATARACT EXTRACTION W/PHACO: SHX586

## 2012-12-20 SURGERY — PHACOEMULSIFICATION, CATARACT, WITH IOL INSERTION
Anesthesia: Monitor Anesthesia Care | Site: Eye | Laterality: Left | Wound class: Clean

## 2012-12-20 MED ORDER — TETRACAINE HCL 0.5 % OP SOLN
1.0000 [drp] | OPHTHALMIC | Status: AC
  Administered 2012-12-20 (×3): 1 [drp] via OPHTHALMIC

## 2012-12-20 MED ORDER — PHENYLEPHRINE HCL 2.5 % OP SOLN
1.0000 [drp] | OPHTHALMIC | Status: AC
  Administered 2012-12-20 (×3): 1 [drp] via OPHTHALMIC

## 2012-12-20 MED ORDER — BSS IO SOLN
INTRAOCULAR | Status: DC | PRN
  Administered 2012-12-20: 15 mL via INTRAOCULAR

## 2012-12-20 MED ORDER — FENTANYL CITRATE 0.05 MG/ML IJ SOLN
INTRAMUSCULAR | Status: AC
  Filled 2012-12-20: qty 2

## 2012-12-20 MED ORDER — PROVISC 10 MG/ML IO SOLN
INTRAOCULAR | Status: DC | PRN
  Administered 2012-12-20: 8.5 mg via INTRAOCULAR

## 2012-12-20 MED ORDER — FLURBIPROFEN SODIUM 0.03 % OP SOLN
1.0000 [drp] | OPHTHALMIC | Status: AC
  Administered 2012-12-20 (×3): 1 [drp] via OPHTHALMIC

## 2012-12-20 MED ORDER — EPINEPHRINE HCL 1 MG/ML IJ SOLN
INTRAMUSCULAR | Status: AC
  Filled 2012-12-20: qty 1

## 2012-12-20 MED ORDER — CYCLOPENTOLATE-PHENYLEPHRINE 0.2-1 % OP SOLN
1.0000 [drp] | OPHTHALMIC | Status: AC
  Administered 2012-12-20 (×3): 1 [drp] via OPHTHALMIC

## 2012-12-20 MED ORDER — EPINEPHRINE HCL 1 MG/ML IJ SOLN
INTRAOCULAR | Status: DC | PRN
  Administered 2012-12-20: 11:00:00

## 2012-12-20 MED ORDER — LACTATED RINGERS IV SOLN
INTRAVENOUS | Status: DC
  Administered 2012-12-20: 10:00:00 via INTRAVENOUS

## 2012-12-20 MED ORDER — POVIDONE-IODINE 5 % OP SOLN
OPHTHALMIC | Status: DC | PRN
  Administered 2012-12-20: 1 via OPHTHALMIC

## 2012-12-20 MED ORDER — MIDAZOLAM HCL 2 MG/2ML IJ SOLN
1.0000 mg | INTRAMUSCULAR | Status: DC | PRN
  Administered 2012-12-20: 2 mg via INTRAVENOUS

## 2012-12-20 MED ORDER — MIDAZOLAM HCL 2 MG/2ML IJ SOLN
INTRAMUSCULAR | Status: AC
  Filled 2012-12-20: qty 2

## 2012-12-20 MED ORDER — FENTANYL CITRATE 0.05 MG/ML IJ SOLN
25.0000 ug | INTRAMUSCULAR | Status: AC
  Administered 2012-12-20 (×2): 25 ug via INTRAVENOUS

## 2012-12-20 SURGICAL SUPPLY — 23 items
CAPSULAR TENSION RING-AMO (OPHTHALMIC RELATED) IMPLANT
CLOTH BEACON ORANGE TIMEOUT ST (SAFETY) ×1 IMPLANT
EYE SHIELD UNIVERSAL CLEAR (GAUZE/BANDAGES/DRESSINGS) ×2 IMPLANT
GLOVE BIO SURGEON STRL SZ 6.5 (GLOVE) ×1 IMPLANT
GLOVE ECLIPSE 6.5 STRL STRAW (GLOVE) IMPLANT
GLOVE ECLIPSE 7.0 STRL STRAW (GLOVE) IMPLANT
GLOVE EXAM NITRILE LRG STRL (GLOVE) IMPLANT
GLOVE EXAM NITRILE MD LF STRL (GLOVE) ×1 IMPLANT
GLOVE SKINSENSE NS SZ6.5 (GLOVE)
GLOVE SKINSENSE STRL SZ6.5 (GLOVE) IMPLANT
HEALON 5 0.6 ML (INTRAOCULAR LENS) IMPLANT
KIT VITRECTOMY (OPHTHALMIC RELATED) IMPLANT
PAD ARMBOARD 7.5X6 YLW CONV (MISCELLANEOUS) ×1 IMPLANT
PROC W NO LENS (INTRAOCULAR LENS)
PROC W SPEC LENS (INTRAOCULAR LENS)
PROCESS W NO LENS (INTRAOCULAR LENS) IMPLANT
PROCESS W SPEC LENS (INTRAOCULAR LENS) IMPLANT
RING MALYGIN (MISCELLANEOUS) IMPLANT
SIGHTPATH CAT PROC W REG LENS (Ophthalmic Related) ×2 IMPLANT
TAPE SURG TRANSPORE 1 IN (GAUZE/BANDAGES/DRESSINGS) IMPLANT
TAPE SURGICAL TRANSPORE 1 IN (GAUZE/BANDAGES/DRESSINGS) ×1
VISCOELASTIC ADDITIONAL (OPHTHALMIC RELATED) IMPLANT
WATER STERILE IRR 250ML POUR (IV SOLUTION) ×1 IMPLANT

## 2012-12-20 NOTE — Anesthesia Postprocedure Evaluation (Signed)
  Anesthesia Post-op Note  Patient: Guy Roberson  Procedure(s) Performed: Procedure(s) with comments: CATARACT EXTRACTION PHACO AND INTRAOCULAR LENS PLACEMENT (IOC) (Left) - CDE:6.67  Patient Location: Short Stay  Anesthesia Type:MAC  Level of Consciousness: awake, alert , oriented and patient cooperative  Airway and Oxygen Therapy: Patient Spontanous Breathing  Post-op Pain: none  Post-op Assessment: Post-op Vital signs reviewed, Patient's Cardiovascular Status Stable, Respiratory Function Stable, Patent Airway and Pain level controlled  Post-op Vital Signs: Reviewed and stable  Complications: No apparent anesthesia complications

## 2012-12-20 NOTE — Op Note (Signed)
Patient brought to the operating room and prepped and draped in the usual manner.  Lid speculum inserted in left eye.  Stab incision made at the twelve o'clock position.  Provisc instilled in the anterior chamber.   A 2.4 mm. Stab incision was made temporally.  An anterior capsulotomy was done with a bent 25 gauge needle.  The nucleus was hydrodissected.  The Phaco tip was inserted in the anterior chamber and the nucleus was emulsified.  CDE was 6.67.  The cortical material was then removed with the I and A tip.  Posterior capsule was the polished.  The anterior chamber was deepened with Provisc.  A 23.5 Abbott Technis multifocal IOL was then inserted in the capsular bag.  Provisc was then removed with the I and A tip.  The wound was then hydrated.  Patient sent to the Recovery Room in good condition with follow up in my office.  Preoperative Diagnosis:  Nuclear Cataract OS Postoperative Diagnosis:  Same Procedure name: Kelman Phacoemulsification OS with IOL

## 2012-12-20 NOTE — Transfer of Care (Signed)
Immediate Anesthesia Transfer of Care Note  Patient: Guy Roberson  Procedure(s) Performed: Procedure(s) with comments: CATARACT EXTRACTION PHACO AND INTRAOCULAR LENS PLACEMENT (IOC) (Left) - CDE:6.67  Patient Location: Short Stay  Anesthesia Type:MAC  Level of Consciousness: awake, alert , oriented and patient cooperative  Airway & Oxygen Therapy: Patient Spontanous Breathing  Post-op Assessment: Report given to PACU RN, Post -op Vital signs reviewed and stable and Patient moving all extremities  Post vital signs: Reviewed and stable  Complications: No apparent anesthesia complications

## 2012-12-20 NOTE — Anesthesia Preprocedure Evaluation (Signed)
Anesthesia Evaluation  Patient identified by MRN, date of birth, ID band Patient awake    Reviewed: Allergy & Precautions, H&P , NPO status , Patient's Chart, lab work & pertinent test results  Airway Mallampati: II TM Distance: <3 FB Neck ROM: Full    Dental no notable dental hx.    Pulmonary neg pulmonary ROS,  breath sounds clear to auscultation  Pulmonary exam normal       Cardiovascular hypertension, Pt. on medications + CAD, + CABG and + Peripheral Vascular Disease + dysrhythmias + pacemaker Rhythm:Regular Rate:Normal     Neuro/Psych Seizures -, Well Controlled,  negative neurological ROS  negative psych ROS   GI/Hepatic negative GI ROS, Neg liver ROS,   Endo/Other  diabetes, Type 2  Renal/GU negative Renal ROS  negative genitourinary   Musculoskeletal negative musculoskeletal ROS (+)   Abdominal   Peds negative pediatric ROS (+)  Hematology negative hematology ROS (+)   Anesthesia Other Findings   Reproductive/Obstetrics negative OB ROS                           Anesthesia Physical Anesthesia Plan  ASA: III  Anesthesia Plan: MAC   Post-op Pain Management:    Induction: Intravenous  Airway Management Planned: Nasal Cannula  Additional Equipment:   Intra-op Plan:   Post-operative Plan:   Informed Consent: I have reviewed the patients History and Physical, chart, labs and discussed the procedure including the risks, benefits and alternatives for the proposed anesthesia with the patient or authorized representative who has indicated his/her understanding and acceptance.     Plan Discussed with:   Anesthesia Plan Comments:         Anesthesia Quick Evaluation

## 2012-12-20 NOTE — H&P (Signed)
The patient was re examined and there is no change in the patients condition since the original H and P. 

## 2012-12-21 ENCOUNTER — Encounter (HOSPITAL_COMMUNITY): Payer: Self-pay | Admitting: Ophthalmology

## 2013-02-15 ENCOUNTER — Encounter: Payer: Self-pay | Admitting: *Deleted

## 2013-03-22 ENCOUNTER — Ambulatory Visit (INDEPENDENT_AMBULATORY_CARE_PROVIDER_SITE_OTHER): Payer: Medicare Other | Admitting: Internal Medicine

## 2013-03-22 ENCOUNTER — Encounter: Payer: Self-pay | Admitting: Internal Medicine

## 2013-03-22 VITALS — BP 132/72 | HR 67 | Ht 67.0 in | Wt 165.0 lb

## 2013-03-22 DIAGNOSIS — Z950 Presence of cardiac pacemaker: Secondary | ICD-10-CM

## 2013-03-22 DIAGNOSIS — I10 Essential (primary) hypertension: Secondary | ICD-10-CM

## 2013-03-22 DIAGNOSIS — I2510 Atherosclerotic heart disease of native coronary artery without angina pectoris: Secondary | ICD-10-CM

## 2013-03-22 DIAGNOSIS — I442 Atrioventricular block, complete: Secondary | ICD-10-CM

## 2013-03-22 LAB — PACEMAKER DEVICE OBSERVATION
AL IMPEDENCE PM: 517 Ohm
ATRIAL PACING PM: 91
BATTERY VOLTAGE: 2.8 V
VENTRICULAR PACING PM: 90

## 2013-03-22 NOTE — Assessment & Plan Note (Signed)
He denies anginal symptoms. He exerted himself while he was at [employer], and had no chest tightness.

## 2013-03-22 NOTE — Patient Instructions (Signed)
Your physician recommends that you schedule a follow-up appointment in: 1 year with Dr Taylor and 6 months with Paula  

## 2013-03-22 NOTE — Assessment & Plan Note (Signed)
His Medtronic dual-chamber pacemaker is working normally. We'll plan to recheck in several months. 

## 2013-03-22 NOTE — Progress Notes (Signed)
HPI Guy Roberson returns today for followup. He is a pleasant 79 yo man with a h/o Stokes Adams attacks and documented pauses of over 10 seconds. The patient also has a h/o BPH and is s/p prostatectomy with indwelling suprapubic catheter. The patient has done well. No chest pain or sob and no syncope since his PPM was placed by Dr. Johney Frame. He does note pain in his lower extremities. This is not associated with walking. He also notes weakness in his lower extremities. Allergies  Allergen Reactions  . Penicillins Other (See Comments)    Childhood allergy     Current Outpatient Prescriptions  Medication Sig Dispense Refill  . aspirin EC 81 MG tablet Take 81 mg by mouth every morning.      . ciprofloxacin (CIPRO) 500 MG tablet Take 500 mg by mouth daily as needed (Bladder Infections).      . Cyanocobalamin (VITAMIN B 12 PO) Take 1 tablet by mouth daily.      . hydrochlorothiazide (HYDRODIURIL) 25 MG tablet Take 25 mg by mouth every morning.       Marland Kitchen lisinopril (PRINIVIL,ZESTRIL) 20 MG tablet Take 20 mg by mouth 2 (two) times daily.       . metoprolol (LOPRESSOR) 100 MG tablet Take 50 mg by mouth 2 (two) times daily.       . potassium chloride SA (K-DUR,KLOR-CON) 20 MEQ tablet Take 20 mEq by mouth every morning.       . simvastatin (ZOCOR) 20 MG tablet Take 20 mg by mouth every morning.       . temazepam (RESTORIL) 30 MG capsule Take 30 mg by mouth at bedtime. For sleep       No current facility-administered medications for this visit.     Past Medical History  Diagnosis Date  . CAD (coronary artery disease)     a.  90% LAD stenosis in 1995 treated with PTCA;  b. 09/2009 CABG x 4: LIMA->LAD, VG->Diag, VG->OM, VG->PDA;  c. 11/2011 Cath: 3VD, 4/4 patent grafts, EF 70%.  . Hyperlipidemia   . Fasting hyperglycemia   . Hypertension   . Colonic polyp   . Trauma 1952    Abdominal and chest in 1952; multiple injuries including pelvic fracture, rib fractures and ruptured bladder  . Syncope     a.  11/2011 18 second pauses noted on Event Monitor assoc w/ syncope  . Complete heart block     a. 11/2011 s/p MDT Adapta L ADDRL 1 Ser # [id number] H.  . Seizures     a. prior to diagnosis of heart block and syncope, this was in the differential and pt was briefly on Keppra, started by ER MD.  . Urinary retention     PT HAS INDWELLING FOLEY CATHETER  . Pacemaker JULY 2013    DR.  G.TAYLOR AND DR. Tommie Sams  . Complication of anesthesia     PROBLEMS VOIDING AFTER PREVIOUS SURGERIES  . Diabetes mellitus without complication     NOT ON ANY DIABETIC MEDICATIONS    ROS:   All systems reviewed and negative except as noted in the HPI.   Past Surgical History  Procedure Laterality Date  . Femoral hernia repair    . Bladder surgery  1950'S  . Colonoscopy w/ polypectomy    . Pacemaker placement    . Transurethral prostatectomy with gyrus instruments  04/04/2012    Procedure: TRANSURETHRAL PROSTATECTOMY WITH GYRUS INSTRUMENTS;  Surgeon: Garnett Farm, MD;  Location: [facility];  Service: Urology;  Laterality: N/A;  TURP with Gyrus and Suprapubic Tube Placement  . Insertion of suprapubic catheter  04/04/2012    Procedure: INSERTION OF SUPRAPUBIC CATHETER;  Surgeon: Garnett Farm, MD;  Location: [facility];  Service: Urology;  Laterality: N/A;  . Coronary artery bypass graft  09/2009    4 vessels  . Cataract extraction w/phaco Right 11/22/2012    Procedure: CATARACT EXTRACTION PHACO AND INTRAOCULAR LENS PLACEMENT (IOC);  Surgeon: Loraine Leriche T. Nile Riggs, MD;  Location: [facility];  Service: Ophthalmology;  Laterality: Right;  CDE 10.27  . Cataract extraction w/phaco Left 12/20/2012    Procedure: CATARACT EXTRACTION PHACO AND INTRAOCULAR LENS PLACEMENT (IOC);  Surgeon: Loraine Leriche T. Nile Riggs, MD;  Location: [facility];  Service: Ophthalmology;  Laterality: Left;  CDE:6.67     Family History  Problem Relation Age of Onset  . Coronary artery disease Brother     x3  . Lung cancer Brother   . Heart block Mother   . Heart murmur  Mother   . Aneurysm Father      History   Social History  . Marital Status: Married    Spouse Name: N/A    Number of Children: N/A  . Years of Education: N/A   Occupational History  . Retired    Social History Main Topics  . Smoking status: Never Smoker   . Smokeless tobacco: Current User    Types: Chew  . Alcohol Use: No  . Drug Use: No  . Sexual Activity: Yes   Other Topics Concern  . Not on file   Social History Narrative  . No narrative on file     BP 132/72  Pulse 67  Ht 5\' 7"  (1.702 m)  Wt 165 lb (74.844 kg)  BMI 25.84 kg/m2  SpO2 98%  Physical Exam:  Well appearing 79 yo man, NAD HEENT: Unremarkable Neck:  6 cm JVD, no thyromegally Lungs:  Clear with no wheezes, rales, or rhonchi. HEART:  Regular rate rhythm, no murmurs, no rubs, no clicks Abd:  soft, positive bowel sounds, no organomegally, no rebound, no guarding Ext:  2 plus pulses, no edema, no cyanosis, no clubbing Skin:  No rashes no nodules Neuro:  CN II through XII intact, motor grossly intact  DEVICE  Normal device function.  See PaceArt for details.   Assess/Plan:

## 2013-03-22 NOTE — Assessment & Plan Note (Signed)
His blood pressure is well controlled. No change in medical therapy. He is encouraged to maintain a low-sodium diet. 

## 2013-03-23 ENCOUNTER — Encounter: Payer: Self-pay | Admitting: Internal Medicine

## 2013-07-21 IMAGING — CR DG CHEST 2V
2 series · 2 of 2 positions shown · non-contrast
Comparison: 12/30/2011

CLINICAL DATA: Extubated.  Pacer insertion, shortness of breath,
syncope

CHEST - 2 VIEW

[w chest pa]
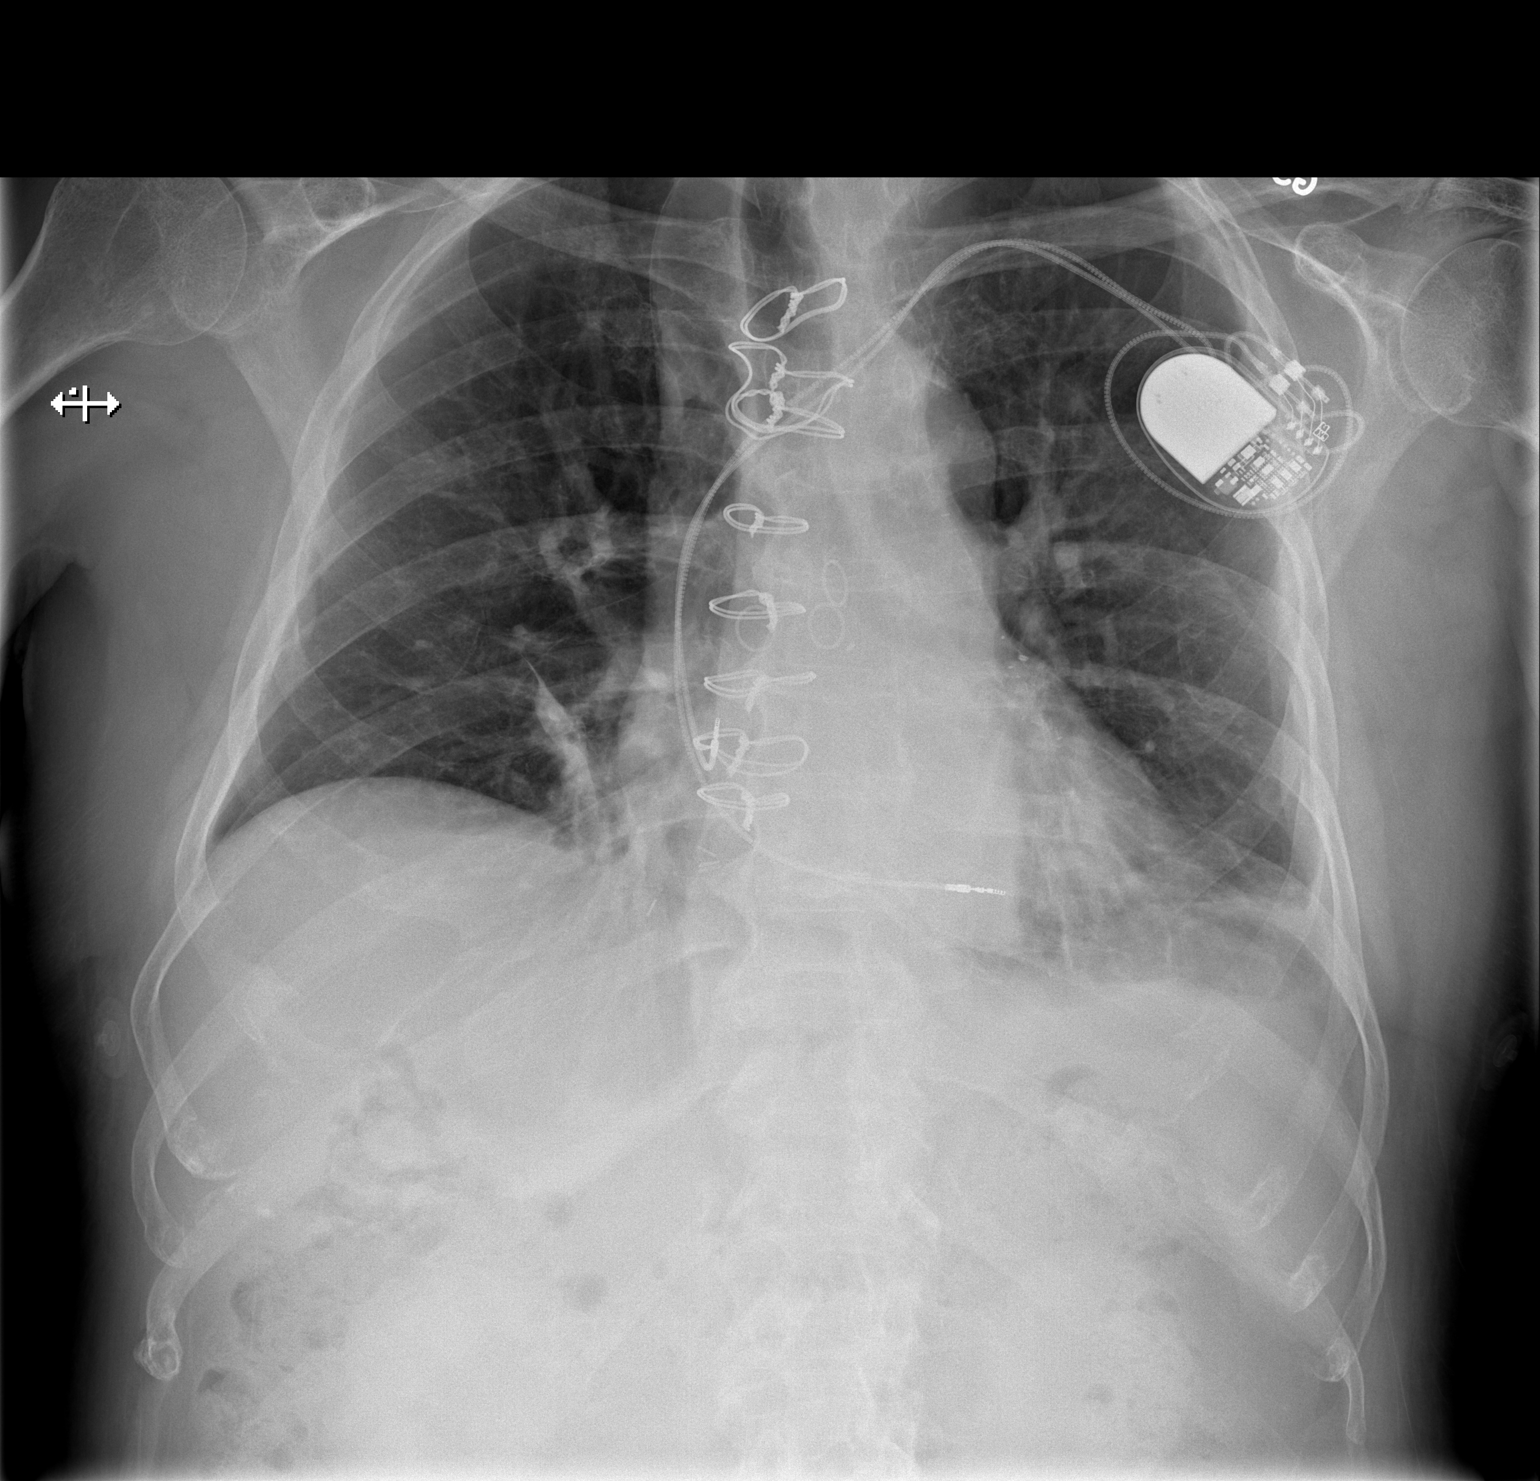

[w chest lat]
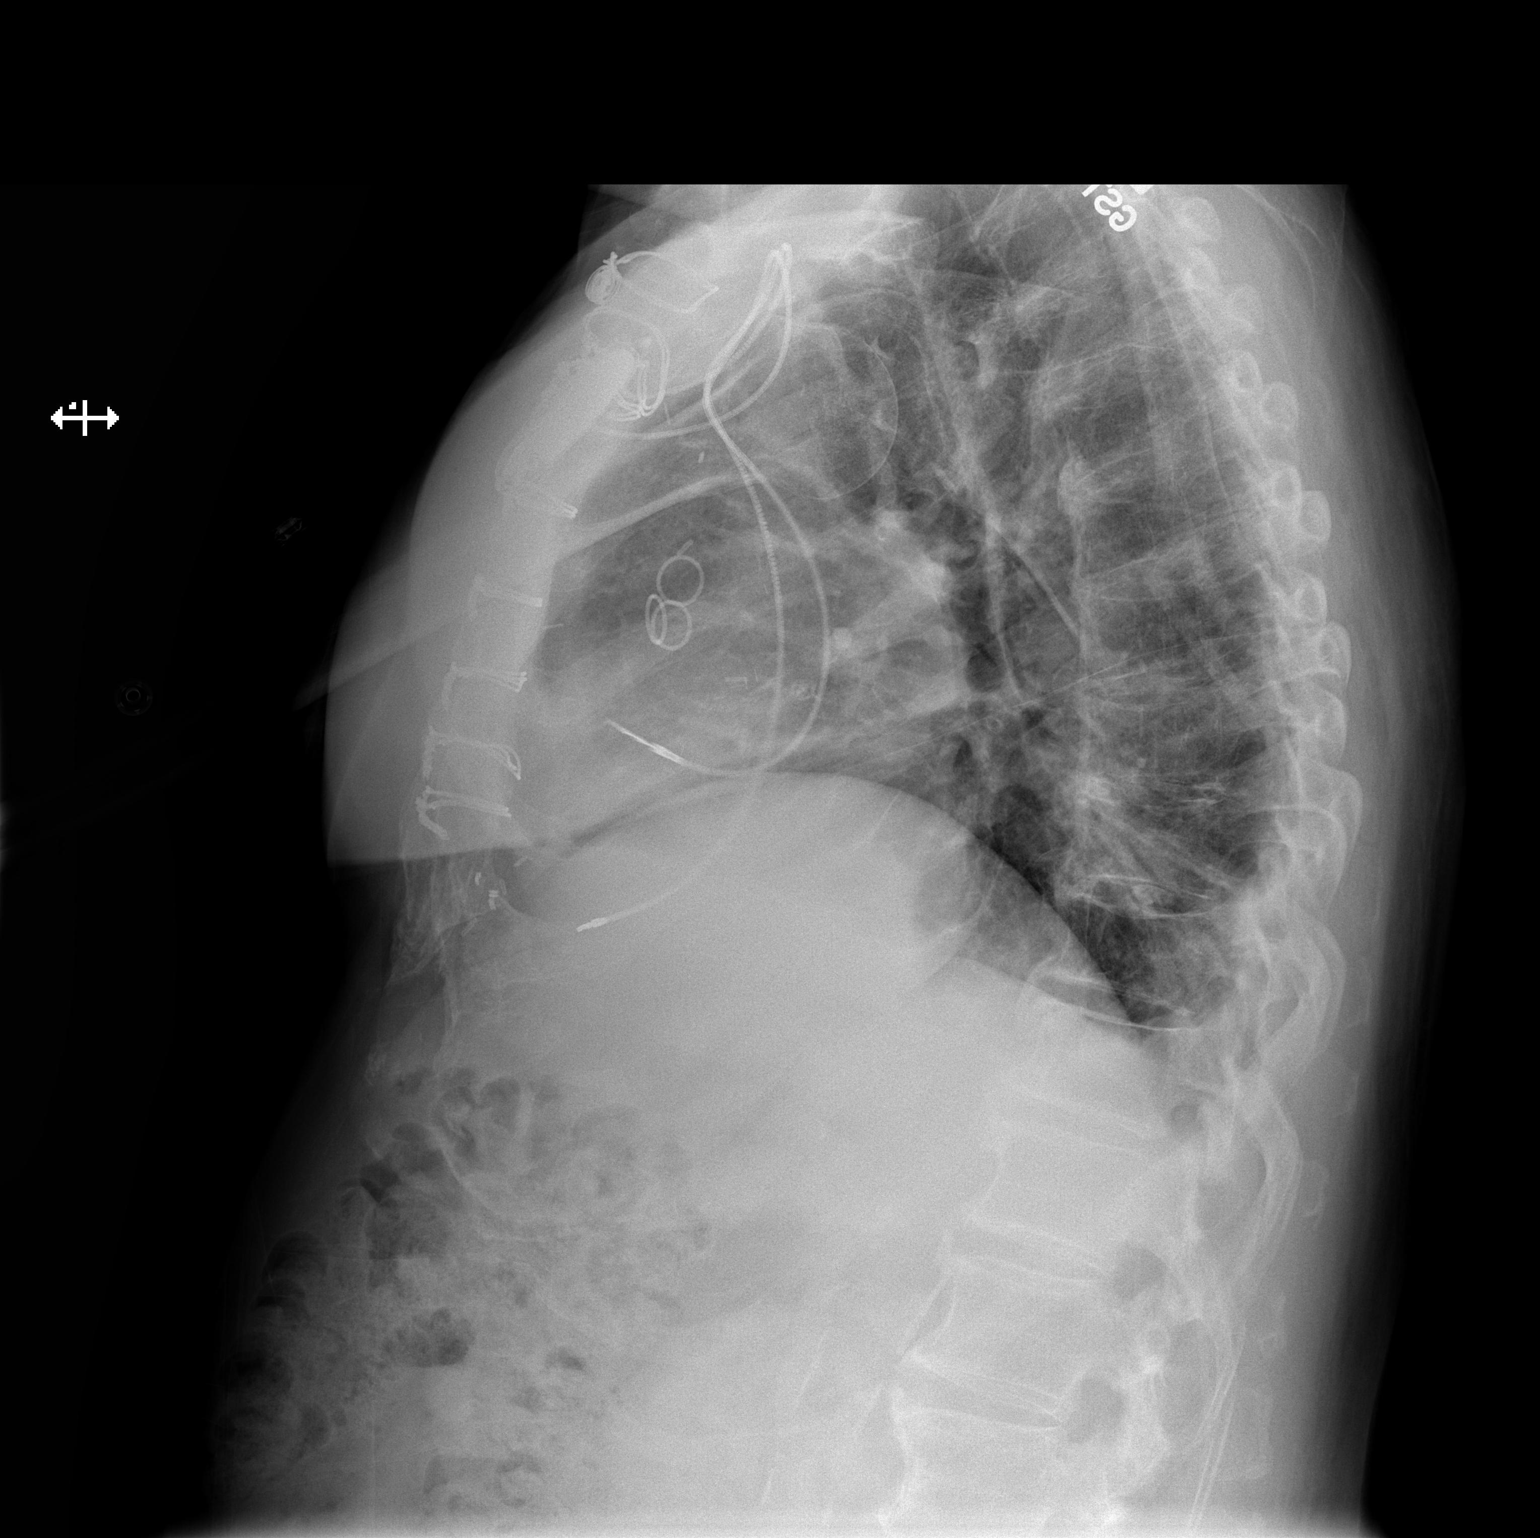

[2 of 2 positions shown; findings below may reference images not displayed]

FINDINGS: Interval extubation.  Left subclavian two lead pacer
evident.  Coronary bypass changes noted.  Stable heart size and
central vascular congestion.  Slight increased bibasilar
atelectasis.  Negative for CHF or pneumothorax.  Azygos fissure
noted.
IMPRESSION: Extubated.

Stable vascular congestion and atelectasis

Negative for pneumothorax following pacer revision

## 2013-10-11 ENCOUNTER — Encounter: Payer: Self-pay | Admitting: *Deleted

## 2013-11-10 ENCOUNTER — Encounter: Payer: Self-pay | Admitting: Internal Medicine

## 2013-11-10 ENCOUNTER — Ambulatory Visit (INDEPENDENT_AMBULATORY_CARE_PROVIDER_SITE_OTHER): Payer: Medicare Other | Admitting: *Deleted

## 2013-11-10 DIAGNOSIS — I442 Atrioventricular block, complete: Secondary | ICD-10-CM

## 2013-11-10 LAB — MDC_IDC_ENUM_SESS_TYPE_INCLINIC
Battery Voltage: 2.79 V
Brady Statistic AP VS Percent: 0 %
Brady Statistic AS VP Percent: 7 %
Brady Statistic AS VS Percent: 0 %
Date Time Interrogation Session: 20150612135333
Lead Channel Impedance Value: 618 Ohm
Lead Channel Pacing Threshold Amplitude: 0.75 V
Lead Channel Pacing Threshold Amplitude: 1 V
Lead Channel Pacing Threshold Pulse Width: 0.4 ms
Lead Channel Sensing Intrinsic Amplitude: 4 mV
Lead Channel Setting Pacing Pulse Width: 0.4 ms
MDC IDC MSMT BATTERY IMPEDANCE: 109 Ohm
MDC IDC MSMT BATTERY REMAINING LONGEVITY: 143 mo
MDC IDC MSMT LEADCHNL RA IMPEDANCE VALUE: 533 Ohm
MDC IDC MSMT LEADCHNL RV PACING THRESHOLD PULSEWIDTH: 0.4 ms
MDC IDC SET LEADCHNL RA PACING AMPLITUDE: 1.5 V
MDC IDC SET LEADCHNL RV PACING AMPLITUDE: 2 V
MDC IDC SET LEADCHNL RV SENSING SENSITIVITY: 5.6 mV
MDC IDC STAT BRADY AP VP PERCENT: 93 %

## 2013-11-10 NOTE — Progress Notes (Signed)
PPM check in office. 

## 2013-11-29 ENCOUNTER — Ambulatory Visit (HOSPITAL_COMMUNITY)
Admission: RE | Admit: 2013-11-29 | Discharge: 2013-11-29 | Disposition: A | Discharge status: Home / Self Care | Payer: Medicare Other | Source: Ambulatory Visit | Attending: Family Medicine | Admitting: Family Medicine

## 2013-11-29 ENCOUNTER — Other Ambulatory Visit (HOSPITAL_COMMUNITY): Payer: Self-pay | Admitting: Family Medicine

## 2013-11-29 DIAGNOSIS — N281 Cyst of kidney, acquired: Secondary | ICD-10-CM | POA: Insufficient documentation

## 2013-11-29 DIAGNOSIS — K8020 Calculus of gallbladder without cholecystitis without obstruction: Secondary | ICD-10-CM | POA: Diagnosis not present

## 2013-11-29 DIAGNOSIS — R52 Pain, unspecified: Secondary | ICD-10-CM

## 2013-11-29 DIAGNOSIS — R109 Unspecified abdominal pain: Secondary | ICD-10-CM | POA: Diagnosis present

## 2014-04-11 ENCOUNTER — Encounter: Payer: Self-pay | Admitting: *Deleted

## 2014-05-10 ENCOUNTER — Encounter (HOSPITAL_COMMUNITY): Payer: Self-pay | Admitting: Cardiovascular Disease

## 2014-06-25 ENCOUNTER — Encounter: Payer: Self-pay | Admitting: Internal Medicine

## 2014-06-25 ENCOUNTER — Ambulatory Visit (INDEPENDENT_AMBULATORY_CARE_PROVIDER_SITE_OTHER): Payer: 59 | Admitting: Internal Medicine

## 2014-06-25 VITALS — BP 138/82 | HR 80 | Ht 67.0 in | Wt 163.0 lb

## 2014-06-25 DIAGNOSIS — I442 Atrioventricular block, complete: Secondary | ICD-10-CM

## 2014-06-25 DIAGNOSIS — Z950 Presence of cardiac pacemaker: Secondary | ICD-10-CM

## 2014-06-25 DIAGNOSIS — I10 Essential (primary) hypertension: Secondary | ICD-10-CM

## 2014-06-25 DIAGNOSIS — E785 Hyperlipidemia, unspecified: Secondary | ICD-10-CM

## 2014-06-25 DIAGNOSIS — I2510 Atherosclerotic heart disease of native coronary artery without angina pectoris: Secondary | ICD-10-CM

## 2014-06-25 LAB — MDC_IDC_ENUM_SESS_TYPE_INCLINIC
Battery Impedance: 133 Ohm
Battery Remaining Longevity: 136 mo
Battery Voltage: 2.79 V
Brady Statistic AP VS Percent: 0 %
Date Time Interrogation Session: 20160125111442
Lead Channel Impedance Value: 526 Ohm
Lead Channel Impedance Value: 644 Ohm
Lead Channel Pacing Threshold Amplitude: 0.75 V
Lead Channel Pacing Threshold Pulse Width: 0.4 ms
Lead Channel Sensing Intrinsic Amplitude: 4 mV
Lead Channel Setting Pacing Amplitude: 1.5 V
Lead Channel Setting Pacing Amplitude: 2 V
MDC IDC MSMT LEADCHNL RA PACING THRESHOLD PULSEWIDTH: 0.4 ms
MDC IDC MSMT LEADCHNL RV PACING THRESHOLD AMPLITUDE: 0.75 V
MDC IDC SET LEADCHNL RV PACING PULSEWIDTH: 0.4 ms
MDC IDC SET LEADCHNL RV SENSING SENSITIVITY: 5.6 mV
MDC IDC STAT BRADY AP VP PERCENT: 93 %
MDC IDC STAT BRADY AS VP PERCENT: 7 %
MDC IDC STAT BRADY AS VS PERCENT: 0 %

## 2014-06-25 NOTE — Progress Notes (Signed)
HPI [name] returns today for followup. He is a pleasant 81 yo man with a h/o Stokes Adams attacks and documented pauses of over 10 seconds. The patient also has a h/o BPH and is s/p prostatectomy with indwelling suprapubic catheter. The patient has had some problems with dizziness. He denies sob. No edema. He is now pacing 99% of the time. Allergies  Allergen Reactions  . Penicillins Other (See Comments)    Childhood allergy     Current Outpatient Prescriptions  Medication Sig Dispense Refill  . aspirin EC 81 MG tablet Take 81 mg by mouth every morning.    . ciprofloxacin (CIPRO) 500 MG tablet Take 500 mg by mouth daily as needed (Bladder Infections).    . Cyanocobalamin (VITAMIN B 12 PO) Take 1 tablet by mouth daily.    . hydrochlorothiazide (HYDRODIURIL) 25 MG tablet Take 25 mg by mouth every morning.     Marland Kitchen. lisinopril (PRINIVIL,ZESTRIL) 20 MG tablet Take 20 mg by mouth 2 (two) times daily.     . metoprolol (LOPRESSOR) 100 MG tablet Take 50 mg by mouth 2 (two) times daily.     . potassium chloride SA (K-DUR,KLOR-CON) 20 MEQ tablet Take 20 mEq by mouth every morning.     . simvastatin (ZOCOR) 20 MG tablet Take 20 mg by mouth every morning.     . temazepam (RESTORIL) 30 MG capsule Take 30 mg by mouth at bedtime. For sleep     No current facility-administered medications for this visit.     Past Medical History  Diagnosis Date  . CAD (coronary artery disease)     a.  90% LAD stenosis in 1995 treated with PTCA;  b. 09/2009 CABG x 4: LIMA->LAD, VG->Diag, VG->OM, VG->PDA;  c. 11/2011 Cath: 3VD, 4/4 patent grafts, EF 70%.  . Hyperlipidemia   . Fasting hyperglycemia   . Hypertension   . Colonic polyp   . Trauma 1952    Abdominal and chest in 1952; multiple injuries including pelvic fracture, rib fractures and ruptured bladder  . Syncope     a. 11/2011 18 second pauses noted on Event Monitor assoc w/ syncope  . Complete heart block     a. 11/2011 s/p MDT Adapta L ADDRL 1 Ser #  [id number] H.  . Seizures     a. prior to diagnosis of heart block and syncope, this was in the differential and pt was briefly on Keppra, started by ER MD.  . Urinary retention     PT HAS INDWELLING FOLEY CATHETER  . Pacemaker JULY 2013    DR.  G.Fabio Wah AND DR. Tommie SamsJ. ALLRED  . Complication of anesthesia     PROBLEMS VOIDING AFTER PREVIOUS SURGERIES  . Diabetes mellitus without complication     NOT ON ANY DIABETIC MEDICATIONS    ROS:   All systems reviewed and negative except as noted in the HPI.   Past Surgical History  Procedure Laterality Date  . Femoral hernia repair    . Bladder surgery  1950'S  . Colonoscopy w/ polypectomy    . Pacemaker placement    . Transurethral prostatectomy with gyrus instruments  04/04/2012    Procedure: TRANSURETHRAL PROSTATECTOMY WITH GYRUS INSTRUMENTS;  Surgeon: Garnett FarmMark C Ottelin, MD;  Location: [facility];  Service: Urology;  Laterality: N/A;  TURP with Gyrus and Suprapubic Tube Placement  . Insertion of suprapubic catheter  04/04/2012    Procedure: INSERTION OF SUPRAPUBIC CATHETER;  Surgeon: Garnett FarmMark C Ottelin, MD;  Location: [facility];  Service: Urology;  Laterality: N/A;  . Coronary artery bypass graft  09/2009    4 vessels  . Cataract extraction w/phaco Right 11/22/2012    Procedure: CATARACT EXTRACTION PHACO AND INTRAOCULAR LENS PLACEMENT (IOC);  Surgeon: Loraine Leriche T. Nile Riggs, MD;  Location: [facility];  Service: Ophthalmology;  Laterality: Right;  CDE 10.27  . Cataract extraction w/phaco Left 12/20/2012    Procedure: CATARACT EXTRACTION PHACO AND INTRAOCULAR LENS PLACEMENT (IOC);  Surgeon: Loraine Leriche T. Nile Riggs, MD;  Location: [facility];  Service: Ophthalmology;  Laterality: Left;  CDE:6.67  . Temporary pacemaker insertion N/A 12/29/2011    Procedure: TEMPORARY PACEMAKER INSERTION;  Surgeon: [name], MD;  Location: [facility] CATH LAB;  Service: Cardiovascular;  Laterality: N/A;  . Left heart catheterization with coronary/graft angiogram  12/29/2011    Procedure: LEFT HEART  CATHETERIZATION WITH Isabel Caprice;  Surgeon: [name], MD;  Location: [facility] CATH LAB;  Service: Cardiovascular;;  . Lead revision N/A 12/30/2011    Procedure: LEAD REVISION;  Surgeon: Marinus Maw, MD;  Location: [facility] CATH LAB;  Service: Cardiovascular;  Laterality: N/A;     Family History  Problem Relation Age of Onset  . Coronary artery disease Brother     x3  . Lung cancer Brother   . Heart block Mother   . Heart murmur Mother   . Aneurysm Father      History   Social History  . Marital Status: Married    Spouse Name: N/A    Number of Children: N/A  . Years of Education: N/A   Occupational History  . Retired    Social History Main Topics  . Smoking status: Never Smoker   . Smokeless tobacco: Current User    Types: Chew  . Alcohol Use: No  . Drug Use: No  . Sexual Activity: Yes   Other Topics Concern  . Not on file   Social History Narrative     BP 138/82 mmHg  Pulse 80  Ht  (1.702 m)  Wt 163 lb (73.936 kg)  BMI 25.52 kg/m2  SpO2 94%  Physical Exam:  Well appearing 81 yo man, NAD HEENT: Unremarkable Neck:  6 cm JVD, no thyromegally Lungs:  Clear with no wheezes, rales, or rhonchi. HEART:  Regular rate rhythm, no murmurs, no rubs, no clicks Abd:  soft, positive bowel sounds, no organomegally, no rebound, no guarding Ext:  2 plus pulses, no edema, no cyanosis, no clubbing Skin:  No rashes no nodules Neuro:  CN II through XII intact, motor grossly intact  ECG - AV sequential pacing  DEVICE  Normal device function.  See PaceArt for details. Will reprogram PM to shorten AV delay  Assess/Plan:

## 2014-06-25 NOTE — Patient Instructions (Signed)
Your physician wants you to follow-up in: 1 year with Dr. Taylor. You will receive a reminder letter in the mail two months in advance. If you don't receive a letter, please call our office to schedule the follow-up appointment.  Remote monitoring is used to monitor your Pacemaker of ICD from home. This monitoring reduces the number of office visits required to check your device to one time per year. It allows us to keep an eye on the functioning of your device to ensure it is working properly. You are scheduled for a device check from home on April 25. You may send your transmission at any time that day. If you have a wireless device, the transmission will be sent automatically. After your physician reviews your transmission, you will receive a postcard with your next transmission date.  Your physician recommends that you continue on your current medications as directed. Please refer to the Current Medication list given to you today.  Thank you for choosing [facility]! ' 

## 2014-06-25 NOTE — Assessment & Plan Note (Signed)
His blood pressure is well controlled. Will follow. 

## 2014-06-25 NOTE — Assessment & Plan Note (Signed)
His device is working normally. Will shorten up AV delay.

## 2014-06-25 NOTE — Assessment & Plan Note (Signed)
He denies anginal symptoms. Will follow. 

## 2014-09-24 ENCOUNTER — Ambulatory Visit (INDEPENDENT_AMBULATORY_CARE_PROVIDER_SITE_OTHER): Payer: Medicare Other | Admitting: *Deleted

## 2014-09-24 ENCOUNTER — Telehealth: Payer: Self-pay | Admitting: Cardiology

## 2014-09-24 DIAGNOSIS — I442 Atrioventricular block, complete: Secondary | ICD-10-CM

## 2014-09-24 LAB — MDC_IDC_ENUM_SESS_TYPE_REMOTE
Battery Impedance: 156 Ohm
Battery Remaining Longevity: 131 mo
Brady Statistic AP VP Percent: 96 %
Brady Statistic AP VS Percent: 0 %
Date Time Interrogation Session: 20160425163040
Lead Channel Pacing Threshold Amplitude: 0.75 V
Lead Channel Pacing Threshold Pulse Width: 0.4 ms
Lead Channel Setting Pacing Pulse Width: 0.4 ms
Lead Channel Setting Sensing Sensitivity: 5.6 mV
MDC IDC MSMT BATTERY VOLTAGE: 2.79 V
MDC IDC MSMT LEADCHNL RA IMPEDANCE VALUE: 541 Ohm
MDC IDC MSMT LEADCHNL RV IMPEDANCE VALUE: 665 Ohm
MDC IDC MSMT LEADCHNL RV PACING THRESHOLD AMPLITUDE: 0.75 V
MDC IDC MSMT LEADCHNL RV PACING THRESHOLD PULSEWIDTH: 0.4 ms
MDC IDC SET LEADCHNL RA PACING AMPLITUDE: 1.5 V
MDC IDC SET LEADCHNL RV PACING AMPLITUDE: 2 V
MDC IDC STAT BRADY AS VP PERCENT: 4 %
MDC IDC STAT BRADY AS VS PERCENT: 0 %

## 2014-09-24 NOTE — Progress Notes (Signed)
Remote pacemaker transmission.   

## 2014-09-24 NOTE — Telephone Encounter (Signed)
Spoke with pt and reminded pt of remote transmission that is due today. Pt verbalized understanding.   

## 2014-09-28 ENCOUNTER — Encounter: Payer: Self-pay | Admitting: Cardiology

## 2014-10-05 ENCOUNTER — Encounter: Payer: Self-pay | Admitting: Internal Medicine

## 2014-11-22 ENCOUNTER — Telehealth: Payer: Self-pay | Admitting: Internal Medicine

## 2014-11-22 NOTE — Telephone Encounter (Signed)
Spoke with patient about Wireless adapter and walked through connecting with monitor. Patient appreciative of phone call. Confirmed remote appt 12/24/14.

## 2014-11-22 NOTE — Telephone Encounter (Signed)
Pt left message on voicemail re device-couldn't understand message and couldn't forward it-mailbox full pls call [phone]

## 2014-12-05 ENCOUNTER — Other Ambulatory Visit (HOSPITAL_COMMUNITY): Payer: Self-pay | Admitting: Family Medicine

## 2014-12-05 DIAGNOSIS — M6281 Muscle weakness (generalized): Secondary | ICD-10-CM

## 2014-12-07 ENCOUNTER — Ambulatory Visit (HOSPITAL_COMMUNITY)
Admission: RE | Admit: 2014-12-07 | Discharge: 2014-12-07 | Disposition: A | Discharge status: Home / Self Care | Payer: Medicare Other | Source: Ambulatory Visit | Attending: Family Medicine | Admitting: Family Medicine

## 2014-12-10 ENCOUNTER — Other Ambulatory Visit (HOSPITAL_COMMUNITY): Payer: Self-pay | Admitting: Family Medicine

## 2014-12-10 ENCOUNTER — Ambulatory Visit (HOSPITAL_COMMUNITY)
Admission: RE | Admit: 2014-12-10 | Discharge: 2014-12-10 | Disposition: A | Discharge status: Home / Self Care | Payer: Medicare Other | Source: Ambulatory Visit | Attending: Family Medicine | Admitting: Family Medicine

## 2014-12-10 DIAGNOSIS — M6281 Muscle weakness (generalized): Secondary | ICD-10-CM

## 2014-12-10 DIAGNOSIS — M545 Low back pain: Secondary | ICD-10-CM | POA: Insufficient documentation

## 2014-12-10 DIAGNOSIS — M5136 Other intervertebral disc degeneration, lumbar region: Secondary | ICD-10-CM | POA: Insufficient documentation

## 2014-12-10 DIAGNOSIS — M2578 Osteophyte, vertebrae: Secondary | ICD-10-CM | POA: Diagnosis not present

## 2014-12-24 ENCOUNTER — Encounter: Payer: Medicare Other | Admitting: *Deleted

## 2015-01-09 ENCOUNTER — Encounter: Payer: Self-pay | Admitting: *Deleted

## 2015-06-25 ENCOUNTER — Ambulatory Visit (HOSPITAL_COMMUNITY)
Admission: RE | Admit: 2015-06-25 | Discharge: 2015-06-25 | Payer: Medicare Other | Source: Ambulatory Visit | Attending: Ophthalmology | Admitting: Ophthalmology

## 2015-06-25 ENCOUNTER — Encounter (HOSPITAL_COMMUNITY)
Admission: RE | Disposition: A | Discharge status: Other Acute Inpatient Hospital | Payer: Self-pay | Source: Ambulatory Visit | Attending: Ophthalmology

## 2015-06-25 DIAGNOSIS — I10 Essential (primary) hypertension: Secondary | ICD-10-CM | POA: Insufficient documentation

## 2015-06-25 DIAGNOSIS — Z79899 Other long term (current) drug therapy: Secondary | ICD-10-CM | POA: Insufficient documentation

## 2015-06-25 DIAGNOSIS — Z7982 Long term (current) use of aspirin: Secondary | ICD-10-CM | POA: Insufficient documentation

## 2015-06-25 DIAGNOSIS — H2640 Unspecified secondary cataract: Secondary | ICD-10-CM | POA: Insufficient documentation

## 2015-06-25 DIAGNOSIS — E119 Type 2 diabetes mellitus without complications: Secondary | ICD-10-CM | POA: Diagnosis not present

## 2015-06-25 HISTORY — PX: YAG LASER APPLICATION: SHX6189

## 2015-06-25 SURGERY — TREATMENT, USING YAG LASER
Anesthesia: LOCAL | Laterality: Left

## 2015-06-25 MED ORDER — TROPICAMIDE 1 % OP SOLN
OPHTHALMIC | Status: AC
  Filled 2015-06-25: qty 3

## 2015-06-25 MED ORDER — TROPICAMIDE 1 % OP SOLN
1.0000 [drp] | OPHTHALMIC | Status: AC
  Administered 2015-06-25 (×3): 1 [drp] via OPHTHALMIC

## 2015-06-25 NOTE — Op Note (Signed)
Guy Meno T. Nile Riggs, MD  Procedure: Yag Capsulotomy  Yag Laser Self Test Completedyes. Procedure: Posterior Capsulotomy, Eye Protection Worn by Staff yes. Laser In Use Sign on Door yes.  Laser: Nd:YAG Spot Size: Fixed Burst Mode: III Power Setting: 3.4 mJ/burst Number of shots: 14 Total energy delivered: 44.67 mJ   The patient tolerated the procedure without difficulty. No complications were encountered.   The patient was discharged home with the instructions to continue all her current glaucoma medications, if any.   Patient instructed to go to office at 0100 for intraocular pressure check.  Patient verbalizes understanding of discharge instructions Yes.  .    Pre-Operative Diagnosis: After-Cataract, obscuring vision, 366.53 OS Post-Operative Diagnosis: After-Cataract, obscuring vision, 366.53 OS Date of Cataract Surgery: 12/20/2012

## 2015-06-25 NOTE — Discharge Instructions (Signed)
GEN CLAGG  06/25/2015     Instructions    Activity: No Restrictions.   Diet: Resume Diet you were on at home.   Pain Medication: Tylenol if Needed.   CONTACT YOUR DOCTOR IF YOU HAVE PAIN, REDNESS IN YOUR EYE, OR DECREASED VISION.   Follow-up:today with Jethro Bolus, MD.   Dr. Nile Riggs: [phone]    If you find that you cannot contact your physician, but feel that your signs and   Symptoms warrant a physician's attention, call the Emergency Room at   [phone] ext.[phone].

## 2015-06-25 NOTE — H&P (Signed)
The patient was re examined and there is no change in the patients condition since the original H and P. 

## 2015-06-26 ENCOUNTER — Encounter (HOSPITAL_COMMUNITY): Payer: Self-pay | Admitting: Ophthalmology

## 2015-06-28 ENCOUNTER — Encounter: Payer: Self-pay | Admitting: Internal Medicine

## 2015-06-28 ENCOUNTER — Ambulatory Visit (INDEPENDENT_AMBULATORY_CARE_PROVIDER_SITE_OTHER): Payer: Medicare Other | Admitting: Internal Medicine

## 2015-06-28 VITALS — BP 122/64 | HR 82 | Ht 67.0 in | Wt 161.0 lb

## 2015-06-28 DIAGNOSIS — I442 Atrioventricular block, complete: Secondary | ICD-10-CM | POA: Diagnosis not present

## 2015-06-28 DIAGNOSIS — I2510 Atherosclerotic heart disease of native coronary artery without angina pectoris: Secondary | ICD-10-CM

## 2015-06-28 DIAGNOSIS — I10 Essential (primary) hypertension: Secondary | ICD-10-CM

## 2015-06-28 LAB — CUP PACEART INCLINIC DEVICE CHECK
Battery Remaining Longevity: 114 mo
Battery Voltage: 2.79 V
Brady Statistic AS VP Percent: 5.5 %
Implantable Lead Implant Date: 20130730
Implantable Lead Location: 753860
Lead Channel Impedance Value: 522 Ohm
Lead Channel Pacing Threshold Amplitude: 1 V
Lead Channel Pacing Threshold Pulse Width: 0.4 ms
Lead Channel Sensing Intrinsic Amplitude: 5.6 mV
Lead Channel Setting Pacing Amplitude: 1.5 V
Lead Channel Setting Pacing Pulse Width: 0.4 ms
Lead Channel Setting Sensing Sensitivity: 5.6 mV
MDC IDC LEAD IMPLANT DT: 20130730
MDC IDC LEAD LOCATION: 753859
MDC IDC MSMT BATTERY IMPEDANCE: 204 Ohm
MDC IDC MSMT LEADCHNL RA IMPEDANCE VALUE: 533 Ohm
MDC IDC MSMT LEADCHNL RA PACING THRESHOLD AMPLITUDE: 0.875 V
MDC IDC MSMT LEADCHNL RA PACING THRESHOLD PULSEWIDTH: 0.4 ms
MDC IDC SESS DTM: 20170127144020
MDC IDC SET LEADCHNL RV PACING AMPLITUDE: 2 V
MDC IDC STAT BRADY AP VP PERCENT: 94.5 %
MDC IDC STAT BRADY AP VS PERCENT: 0.1 % — AB
MDC IDC STAT BRADY AS VS PERCENT: 0.1 % — AB

## 2015-06-28 NOTE — Progress Notes (Signed)
HPI Guy Roberson returns today for followup. He is a pleasant 82 yo man with a h/o Stokes Adams attacks and documented pauses of over 10 seconds, s/p PPM insertion. The patient also has a h/o BPH and is s/p prostatectomy with indwelling suprapubic catheter. The patient has had some problems with dizziness. He denies sob. No edema. He is now pacing 99% of the time. Allergies  Allergen Reactions  . Penicillins Other (See Comments)    Has patient had a PCN reaction causing immediate rash, facial/tongue/throat swelling, SOB or lightheadedness with hypotension: unknown Has patient had a PCN reaction causing severe rash involving mucus membranes or skin necrosis: unknown Has patient had a PCN reaction that required hospitalization unknown Has patient had a PCN reaction occurring within the last 10 years: unknown If all of the above answers are "NO", then may proceed with Cephalosporin use.      Childhood allergy     Current Outpatient Prescriptions  Medication Sig Dispense Refill  . aspirin EC 81 MG tablet Take 81 mg by mouth every morning.    . ciprofloxacin (CIPRO) 500 MG tablet Take 500 mg by mouth daily as needed (Bladder Infections).    . Cyanocobalamin (VITAMIN B 12 PO) Take 1 tablet by mouth daily.    . hydrochlorothiazide (HYDRODIURIL) 25 MG tablet Take 25 mg by mouth every morning.     Marland Kitchen lisinopril (PRINIVIL,ZESTRIL) 20 MG tablet Take 20 mg by mouth 2 (two) times daily.     Marland Kitchen LORazepam (ATIVAN) 1 MG tablet Take 1 mg by mouth at bedtime.    . metoprolol (LOPRESSOR) 100 MG tablet Take 50 mg by mouth 2 (two) times daily.     . potassium chloride SA (K-DUR,KLOR-CON) 20 MEQ tablet Take 20 mEq by mouth every morning.     . simvastatin (ZOCOR) 20 MG tablet Take 20 mg by mouth every morning.     . sulfamethoxazole-trimethoprim (BACTRIM DS,SEPTRA DS) 800-160 MG tablet Take 1 tablet by mouth See admin instructions. Take 1 tab twice daily for 5 days as needed for bladder infections     No  current facility-administered medications for this visit.     Past Medical History  Diagnosis Date  . CAD (coronary artery disease)     a.  90% LAD stenosis in 1995 treated with PTCA;  b. 09/2009 CABG x 4: LIMA->LAD, VG->Diag, VG->OM, VG->PDA;  c. 11/2011 Cath: 3VD, 4/4 patent grafts, EF 70%.  . Hyperlipidemia   . Fasting hyperglycemia   . Hypertension   . Colonic polyp   . Trauma 1952    Abdominal and chest in 1952; multiple injuries including pelvic fracture, rib fractures and ruptured bladder  . Syncope     a. 11/2011 18 second pauses noted on Event Monitor assoc w/ syncope  . Complete heart block (HCC)     a. 11/2011 s/p MDT Adapta L ADDRL 1 Ser # [id number] H.  . Seizures (HCC)     a. prior to diagnosis of heart block and syncope, this was in the differential and pt was briefly on Keppra, started by ER MD.  . Urinary retention     PT HAS INDWELLING FOLEY CATHETER  . Pacemaker JULY 2013    DR.  G.Javonte Elenes AND DR. Tommie Sams  . Complication of anesthesia     PROBLEMS VOIDING AFTER PREVIOUS SURGERIES  . Diabetes mellitus without complication (HCC)     NOT ON ANY DIABETIC MEDICATIONS    ROS:   All systems reviewed and negative except as  noted in the HPI.   Past Surgical History  Procedure Laterality Date  . Femoral hernia repair    . Bladder surgery  1950'S  . Colonoscopy w/ polypectomy    . Pacemaker placement    . Transurethral prostatectomy with gyrus instruments  04/04/2012    Procedure: TRANSURETHRAL PROSTATECTOMY WITH GYRUS INSTRUMENTS;  Surgeon: Garnett Farm, MD;  Location: [facility];  Service: Urology;  Laterality: N/A;  TURP with Gyrus and Suprapubic Tube Placement  . Insertion of suprapubic catheter  04/04/2012    Procedure: INSERTION OF SUPRAPUBIC CATHETER;  Surgeon: Garnett Farm, MD;  Location: [facility];  Service: Urology;  Laterality: N/A;  . Coronary artery bypass graft  09/2009    4 vessels  . Cataract extraction w/phaco Right 11/22/2012    Procedure: CATARACT  EXTRACTION PHACO AND INTRAOCULAR LENS PLACEMENT (IOC);  Surgeon: Loraine Leriche T. Nile Riggs, MD;  Location: [facility];  Service: Ophthalmology;  Laterality: Right;  CDE 10.27  . Cataract extraction w/phaco Left 12/20/2012    Procedure: CATARACT EXTRACTION PHACO AND INTRAOCULAR LENS PLACEMENT (IOC);  Surgeon: Loraine Leriche T. Nile Riggs, MD;  Location: [facility];  Service: Ophthalmology;  Laterality: Left;  CDE:6.67  . Temporary pacemaker insertion N/A 12/29/2011    Procedure: TEMPORARY PACEMAKER INSERTION;  Surgeon: [name], MD;  Location: [facility] CATH LAB;  Service: Cardiovascular;  Laterality: N/A;  . Left heart catheterization with coronary/graft angiogram  12/29/2011    Procedure: LEFT HEART CATHETERIZATION WITH Isabel Caprice;  Surgeon: [name], MD;  Location: [facility] CATH LAB;  Service: Cardiovascular;;  . Lead revision N/A 12/30/2011    Procedure: LEAD REVISION;  Surgeon: Marinus Maw, MD;  Location: [facility] CATH LAB;  Service: Cardiovascular;  Laterality: N/A;  . Yag laser application Left 06/25/2015    Procedure: YAG LASER APPLICATION;  Surgeon: Jethro Bolus, MD;  Location: [facility];  Service: Ophthalmology;  Laterality: Left;     Family History  Problem Relation Age of Onset  . Coronary artery disease Brother     x3  . Lung cancer Brother   . Heart block Mother   . Heart murmur Mother   . Aneurysm Father      Social History   Social History  . Marital Status: Married    Spouse Name: N/A  . Number of Children: N/A  . Years of Education: N/A   Occupational History  . Retired    Social History Main Topics  . Smoking status: Never Smoker   . Smokeless tobacco: Current User    Types: Chew  . Alcohol Use: No  . Drug Use: No  . Sexual Activity: Yes   Other Topics Concern  . Not on file   Social History Narrative     BP 122/64 mmHg  Pulse 82  Ht  (1.702 m)  Wt 161 lb (73.029 kg)  BMI 25.21 kg/m2  SpO2 96%  Physical Exam:  Well appearing 82 yo man, NAD HEENT:  Unremarkable Neck:  6 cm JVD, no thyromegally Lungs:  Clear with no wheezes, rales, or rhonchi. HEART:  Regular rate rhythm, no murmurs, no rubs, no clicks Abd:  soft, positive bowel sounds, no organomegally, no rebound, no guarding Ext:  2 plus pulses, no edema, no cyanosis, no clubbing Skin:  No rashes no nodules Neuro:  CN II through XII intact, motor grossly intact  ECG - AV sequential pacing  DEVICE  Normal device function.  See PaceArt for details. Will reprogram PM to shorten AV delay  Assess/Plan:  1.  Stokes - Adams syncope - he is s/p PPM insertion and is doing well with no recurrent syncope 2. Complete heart block - stable after PPM. He has no ventricular escape 3. PPM - his Medtronic DDD PM has been interogated today and is working normally. He has about 10 years of battery longevity.  4. HTN - his blood pressure is good today. No change in meds.  Leonia Reeves.D.

## 2015-06-28 NOTE — Patient Instructions (Signed)
Your physician wants you to follow-up in: 12 Months with Dr. Taylor. You will receive a reminder letter in the mail two months in advance. If you don't receive a letter, please call our office to schedule the follow-up appointment.  Remote monitoring is used to monitor your Pacemaker of ICD from home. This monitoring reduces the number of office visits required to check your device to one time per year. It allows us to keep an eye on the functioning of your device to ensure it is working properly. You are scheduled for a device check from home on 09/30/15. You may send your transmission at any time that day. If you have a wireless device, the transmission will be sent automatically. After your physician reviews your transmission, you will receive a postcard with your next transmission date.  Your physician recommends that you continue on your current medications as directed. Please refer to the Current Medication list given to you today.  If you need a refill on your cardiac medications before your next appointment, please call your pharmacy.  Thank you for choosing [facility]!   

## 2015-09-30 ENCOUNTER — Ambulatory Visit (INDEPENDENT_AMBULATORY_CARE_PROVIDER_SITE_OTHER): Payer: Medicare Other | Admitting: *Deleted

## 2015-09-30 ENCOUNTER — Telehealth: Payer: Self-pay | Admitting: Cardiology

## 2015-09-30 DIAGNOSIS — I442 Atrioventricular block, complete: Secondary | ICD-10-CM

## 2015-09-30 NOTE — Progress Notes (Signed)
Remote pacemaker transmission.   

## 2015-09-30 NOTE — Telephone Encounter (Signed)
Spoke with pt and reminded pt of remote transmission that is due today. Pt verbalized understanding.   

## 2015-11-08 ENCOUNTER — Encounter: Payer: Self-pay | Admitting: Cardiology

## 2015-11-08 LAB — CUP PACEART REMOTE DEVICE CHECK
Battery Remaining Longevity: 110 mo
Brady Statistic AP VP Percent: 97 %
Brady Statistic AS VS Percent: 0 %
Date Time Interrogation Session: 20170501144042
Implantable Lead Implant Date: 20130730
Implantable Lead Model: 5092
Lead Channel Impedance Value: 511 Ohm
Lead Channel Pacing Threshold Amplitude: 1 V
Lead Channel Setting Pacing Amplitude: 2 V
Lead Channel Setting Pacing Amplitude: 2 V
Lead Channel Setting Sensing Sensitivity: 5.6 mV
MDC IDC LEAD IMPLANT DT: 20130730
MDC IDC LEAD LOCATION: 753859
MDC IDC LEAD LOCATION: 753860
MDC IDC MSMT BATTERY IMPEDANCE: 228 Ohm
MDC IDC MSMT BATTERY VOLTAGE: 2.79 V
MDC IDC MSMT LEADCHNL RA IMPEDANCE VALUE: 533 Ohm
MDC IDC MSMT LEADCHNL RA PACING THRESHOLD AMPLITUDE: 1 V
MDC IDC MSMT LEADCHNL RA PACING THRESHOLD PULSEWIDTH: 0.4 ms
MDC IDC MSMT LEADCHNL RV PACING THRESHOLD PULSEWIDTH: 0.4 ms
MDC IDC SET LEADCHNL RV PACING PULSEWIDTH: 0.4 ms
MDC IDC STAT BRADY AP VS PERCENT: 0 %
MDC IDC STAT BRADY AS VP PERCENT: 3 %

## 2015-11-20 ENCOUNTER — Other Ambulatory Visit (HOSPITAL_COMMUNITY): Payer: Self-pay | Admitting: Family Medicine

## 2015-11-20 ENCOUNTER — Ambulatory Visit (HOSPITAL_COMMUNITY)
Admission: RE | Admit: 2015-11-20 | Discharge: 2015-11-20 | Disposition: A | Discharge status: Home / Self Care | Payer: Medicare Other | Source: Ambulatory Visit | Attending: Family Medicine | Admitting: Family Medicine

## 2015-11-20 DIAGNOSIS — I700 Atherosclerosis of aorta: Secondary | ICD-10-CM | POA: Insufficient documentation

## 2015-11-20 DIAGNOSIS — Z9889 Other specified postprocedural states: Secondary | ICD-10-CM | POA: Insufficient documentation

## 2015-11-20 DIAGNOSIS — M6281 Muscle weakness (generalized): Secondary | ICD-10-CM | POA: Insufficient documentation

## 2015-11-20 DIAGNOSIS — Z950 Presence of cardiac pacemaker: Secondary | ICD-10-CM | POA: Insufficient documentation

## 2015-12-30 ENCOUNTER — Ambulatory Visit (INDEPENDENT_AMBULATORY_CARE_PROVIDER_SITE_OTHER): Payer: Medicare Other | Admitting: *Deleted

## 2015-12-30 DIAGNOSIS — I442 Atrioventricular block, complete: Secondary | ICD-10-CM

## 2015-12-30 DIAGNOSIS — Z950 Presence of cardiac pacemaker: Secondary | ICD-10-CM

## 2015-12-30 NOTE — Progress Notes (Signed)
Remote pacemaker transmission.   

## 2016-01-03 LAB — CUP PACEART REMOTE DEVICE CHECK
Brady Statistic AP VS Percent: 0 %
Brady Statistic AS VS Percent: 0 %
Date Time Interrogation Session: 20170731121834
Implantable Lead Location: 753860
Implantable Lead Model: 5092
Lead Channel Impedance Value: 509 Ohm
Lead Channel Pacing Threshold Pulse Width: 0.4 ms
Lead Channel Pacing Threshold Pulse Width: 0.4 ms
Lead Channel Setting Sensing Sensitivity: 5.6 mV
MDC IDC LEAD IMPLANT DT: 20130730
MDC IDC LEAD IMPLANT DT: 20130730
MDC IDC LEAD LOCATION: 753859
MDC IDC MSMT BATTERY IMPEDANCE: 252 Ohm
MDC IDC MSMT BATTERY REMAINING LONGEVITY: 109 mo
MDC IDC MSMT BATTERY VOLTAGE: 2.79 V
MDC IDC MSMT LEADCHNL RA IMPEDANCE VALUE: 541 Ohm
MDC IDC MSMT LEADCHNL RA PACING THRESHOLD AMPLITUDE: 0.875 V
MDC IDC MSMT LEADCHNL RV PACING THRESHOLD AMPLITUDE: 0.875 V
MDC IDC SET LEADCHNL RA PACING AMPLITUDE: 1.75 V
MDC IDC SET LEADCHNL RV PACING AMPLITUDE: 2 V
MDC IDC SET LEADCHNL RV PACING PULSEWIDTH: 0.4 ms
MDC IDC STAT BRADY AP VP PERCENT: 97 %
MDC IDC STAT BRADY AS VP PERCENT: 3 %

## 2016-01-07 ENCOUNTER — Encounter: Payer: Self-pay | Admitting: Cardiology

## 2016-03-20 ENCOUNTER — Telehealth: Payer: Self-pay | Admitting: *Deleted

## 2016-03-20 ENCOUNTER — Encounter (INDEPENDENT_AMBULATORY_CARE_PROVIDER_SITE_OTHER): Payer: Self-pay | Admitting: Internal Medicine

## 2016-03-20 NOTE — Telephone Encounter (Signed)
Spoke with patient regarding "vibrating" PPM.  Reviewed remote transmission, no alerts or abnormalities.  Patient made aware that his MDT PPM is not capable of vibrating, but it is functioning normally.  Patient appreciative and aware of scheduled remote transmission due on 10/30.  He denies additional questions or concerns at this time.

## 2016-03-30 ENCOUNTER — Telehealth: Payer: Self-pay | Admitting: Cardiology

## 2016-03-30 ENCOUNTER — Ambulatory Visit (INDEPENDENT_AMBULATORY_CARE_PROVIDER_SITE_OTHER): Payer: Medicare Other | Admitting: *Deleted

## 2016-03-30 DIAGNOSIS — I442 Atrioventricular block, complete: Secondary | ICD-10-CM

## 2016-03-30 NOTE — Telephone Encounter (Signed)
Spoke with pt and reminded pt of remote transmission that is due today. Pt verbalized understanding.   

## 2016-03-31 NOTE — Progress Notes (Signed)
Remote pacemaker transmission.   

## 2016-04-03 ENCOUNTER — Encounter: Payer: Self-pay | Admitting: Cardiology

## 2016-04-06 ENCOUNTER — Ambulatory Visit (INDEPENDENT_AMBULATORY_CARE_PROVIDER_SITE_OTHER): Payer: Medicare Other | Admitting: Internal Medicine

## 2016-04-15 LAB — CUP PACEART REMOTE DEVICE CHECK
Battery Remaining Longevity: 106 mo
Brady Statistic AP VP Percent: 98 %
Implantable Lead Implant Date: 20130730
Implantable Pulse Generator Implant Date: 20130730
Lead Channel Impedance Value: 541 Ohm
Lead Channel Pacing Threshold Amplitude: 0.875 V
Lead Channel Pacing Threshold Pulse Width: 0.4 ms
Lead Channel Setting Pacing Amplitude: 1.75 V
Lead Channel Setting Sensing Sensitivity: 5.6 mV
MDC IDC LEAD IMPLANT DT: 20130730
MDC IDC LEAD LOCATION: 753859
MDC IDC LEAD LOCATION: 753860
MDC IDC MSMT BATTERY IMPEDANCE: 275 Ohm
MDC IDC MSMT BATTERY VOLTAGE: 2.79 V
MDC IDC MSMT LEADCHNL RA PACING THRESHOLD AMPLITUDE: 0.875 V
MDC IDC MSMT LEADCHNL RA PACING THRESHOLD PULSEWIDTH: 0.4 ms
MDC IDC MSMT LEADCHNL RV IMPEDANCE VALUE: 530 Ohm
MDC IDC SESS DTM: 20171030163530
MDC IDC SET LEADCHNL RV PACING AMPLITUDE: 2 V
MDC IDC SET LEADCHNL RV PACING PULSEWIDTH: 0.4 ms
MDC IDC STAT BRADY AP VS PERCENT: 0 %
MDC IDC STAT BRADY AS VP PERCENT: 2 %
MDC IDC STAT BRADY AS VS PERCENT: 0 %

## 2016-07-23 ENCOUNTER — Encounter: Payer: Self-pay | Admitting: Internal Medicine

## 2016-07-23 ENCOUNTER — Ambulatory Visit (INDEPENDENT_AMBULATORY_CARE_PROVIDER_SITE_OTHER): Payer: Medicare Other | Admitting: Internal Medicine

## 2016-07-23 DIAGNOSIS — I442 Atrioventricular block, complete: Secondary | ICD-10-CM

## 2016-07-23 NOTE — Progress Notes (Signed)
HPI [name] returns today for followup. 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 The patient also has a h/o BPH and is s/p prostatectomy with indwelling suprapubic catheter. The patient has had some problems with dizziness but he has not fallen. He denies sob. No edema. He is now pacing 99% of the time. Allergies  Allergen Reactions  . Penicillins Other (See Comments)    Has patient had a PCN reaction causing immediate rash, facial/tongue/throat swelling, SOB or lightheadedness with hypotension: unknown Has patient had a PCN reaction causing severe rash involving mucus membranes or skin necrosis: unknown Has patient had a PCN reaction that required hospitalization unknown Has patient had a PCN reaction occurring within the last 10 years: unknown If all of the above answers are "NO", then may proceed with Cephalosporin use.      Childhood allergy     Current Outpatient Prescriptions  Medication Sig Dispense Refill  . aspirin EC 81 MG tablet Take 81 mg by mouth every morning.    . Cyanocobalamin (VITAMIN B 12 PO) Take 1 tablet by mouth daily.    . hydrochlorothiazide (HYDRODIURIL) 25 MG tablet Take 25 mg by mouth every morning.     Marland Kitchen. lisinopril (PRINIVIL,ZESTRIL) 20 MG tablet Take 20 mg by mouth 2 (two) times daily.     Marland Kitchen. LORazepam (ATIVAN) 1 MG tablet Take 1 mg by mouth at bedtime.    . metoprolol (LOPRESSOR) 100 MG tablet Take 50 mg by mouth 2 (two) times daily.     . potassium chloride SA (K-DUR,KLOR-CON) 20 MEQ tablet Take 20 mEq by mouth every morning.     . simvastatin (ZOCOR) 20 MG tablet Take 20 mg by mouth every morning.     . sulfamethoxazole-trimethoprim (BACTRIM DS,SEPTRA DS) 800-160 MG tablet Take 1 tablet by mouth See admin instructions. Take 1 tab twice daily for 5 days as needed for bladder infections     No current facility-administered medications for this visit.      Past Medical  History:  Diagnosis Date  . CAD (coronary artery disease)    a.  90% LAD stenosis in 1995 treated with PTCA;  b. 09/2009 CABG x 4: LIMA->LAD, VG->Diag, VG->OM, VG->PDA;  c. 11/2011 Cath: 3VD, 4/4 patent grafts, EF 70%.  . Colonic polyp   . Complete heart block (HCC)    a. 11/2011 s/p MDT Adapta L ADDRL 1 Ser # [id number] H.  . Complication of anesthesia    PROBLEMS VOIDING AFTER PREVIOUS SURGERIES  . Diabetes mellitus without complication (HCC)    NOT ON ANY DIABETIC MEDICATIONS  . Fasting hyperglycemia   . Hyperlipidemia   . Hypertension   . Pacemaker JULY 2013   DR.  G.Carine Nordgren AND DR. Tommie SamsJ. ALLRED  . Seizures (HCC)    a. prior to diagnosis of heart block and syncope, this was in the differential and pt was briefly on Keppra, started by ER MD.  . Syncope    a. 11/2011 18 second pauses noted on Event Monitor assoc w/ syncope  . Trauma 1952   Abdominal and chest in 1952; multiple injuries including pelvic fracture, rib fractures and ruptured bladder  . Urinary retention    PT HAS INDWELLING FOLEY CATHETER    ROS:   All systems reviewed and negative except as noted in the HPI.   Past Surgical History:  Procedure Laterality Date  . BLADDER SURGERY  1950'S  . CATARACT  EXTRACTION W/PHACO Right 11/22/2012   Procedure: CATARACT EXTRACTION PHACO AND INTRAOCULAR LENS PLACEMENT (IOC);  Surgeon: Loraine Leriche T. Nile Riggs, MD;  Location: [facility];  Service: Ophthalmology;  Laterality: Right;  CDE 10.27  . CATARACT EXTRACTION W/PHACO Left 12/20/2012   Procedure: CATARACT EXTRACTION PHACO AND INTRAOCULAR LENS PLACEMENT (IOC);  Surgeon: Loraine Leriche T. Nile Riggs, MD;  Location: [facility];  Service: Ophthalmology;  Laterality: Left;  CDE:6.67  . COLONOSCOPY W/ POLYPECTOMY    . CORONARY ARTERY BYPASS GRAFT  09/2009   4 vessels  . FEMORAL HERNIA REPAIR    . INSERTION OF SUPRAPUBIC CATHETER  04/04/2012   Procedure: INSERTION OF SUPRAPUBIC CATHETER;  Surgeon: Garnett Farm, MD;  Location: [facility];  Service: Urology;  Laterality:  N/A;  . LEAD REVISION N/A 12/30/2011   Procedure: LEAD REVISION;  Surgeon: Marinus Maw, MD;  [facility] C[facility] LAB;  Service: Cardiovascular;  Laterality: N/A;  . LEFT HEART CATHETERIZATION WITH CORONARY/GRAFT ANGIOGRAM  12/29/2011   Procedure: LEFT HEART CATHETERIZATION WITH Isabel Caprice;  Surgeon: [name], MD;  Location: [facility] CATH LAB;  Service: Cardiovascular;;  . PACEMAKER PLACEMENT    . TEMPORARY PACEMAKER INSERTION N/A 12/29/2011   Procedure: TEMPORARY PACEMAKER INSERTION;  Surgeon: [name], MD;  [facility] CATH LAB;  Service: Cardiovascular;  Laterality: N/A;  . TRANSURETHRAL PROSTATECTOMY WITH GYRUS INSTRUMENTS  04/04/2012   Procedure: TRANSURETHRAL PROSTATECTOMY WITH GYRUS INSTRUMENTS;  Surgeon: Garnett Farm, MD;  Location: [facility];  Service: Urology;  Laterality: N/A;  TURP with Gyrus and Suprapubic Tube Placement  . YAG LASER APPLICATION Left 06/25/2015   Procedure: YAG LASER APPLICATION;  Surgeon: Jethro Bolus, MD;  Location: [facility];  Service: Ophthalmology;  Laterality: Left;     Family History  Problem Relation Age of Onset  . Heart block Mother   . Heart murmur Mother   . Aneurysm Father   . Coronary artery disease Brother     x3  . Lung cancer Brother      Social History   Social History  . Marital status: Married    Spouse name: N/A  . Number of children: N/A  . Years of education: N/A   Occupational History  . Retired    Social History Main Topics  . Smoking status: Never Smoker  . Smokeless tobacco: Current User    Types: Chew  . Alcohol use No  . Drug use: No  . Sexual activity: Yes   Other Topics Concern  . Not on file   Social History Narrative  . No narrative on file     BP 128/66   Pulse 75   Ht 5\' 5"  (1.651 m)   Wt 157 lb (71.2 kg)   SpO2 97%   BMI 26.13 kg/m   Physical Exam:  Well appearing 83 yo man, NAD HEENT: Unremarkable Neck:  6 cm JVD, no thyromegally Lungs:  Clear with no wheezes, rales, or  rhonchi. HEART:  Regular rate rhythm, no murmurs, no rubs, no clicks Abd:  soft, positive bowel sounds, no organomegally, no rebound, no guarding Ext:  2 plus pulses, no edema, no cyanosis, no clubbing Skin:  No rashes no nodules Neuro:  CN II through XII intact, motor grossly intact  ECG - AV sequential pacing  DEVICE  Normal device function.  See PaceArt for details.   Assess/Plan:  1. Stokes - Adams syncope - he is s/p PPM insertion and is doing well with no recurrent syncope 2. Complete heart block - stable after PPM. He  has no ventricular escape 3. PPM - his Medtronic DDD PM has been interogated today and is working normally. He has about 8.5 years of battery longevity.  4. HTN - his blood pressure is good today. No change in meds.  Leonia Reeves.D.

## 2016-07-23 NOTE — Patient Instructions (Signed)
Your physician wants you to follow-up in: 1 Year Dr.Taylor.  You will receive a reminder letter in the mail two months in advance. If you don't receive a letter, please call our office to schedule the follow-up appointment.  Remote monitoring is used to monitor your Pacemaker of ICD from home. This monitoring reduces the number of office visits required to check your device to one time per year. It allows us to keep an eye on the functioning of your device to ensure it is working properly. You are scheduled for a device check from home on 10/22/16. You may send your transmission at any time that day. If you have a wireless device, the transmission will be sent automatically. After your physician reviews your transmission, you will receive a postcard with your next transmission date.  Your physician recommends that you continue on your current medications as directed. Please refer to the Current Medication list given to you today.  If you need a refill on your cardiac medications before your next appointment, please call your pharmacy.  Thank you for choosing [facility]!

## 2016-07-24 LAB — CUP PACEART INCLINIC DEVICE CHECK
Battery Impedance: 299 Ohm
Battery Remaining Longevity: 102 mo
Battery Voltage: 2.79 V
Brady Statistic AP VP Percent: 98 %
Brady Statistic AP VS Percent: 0.1 % — CL
Brady Statistic AS VP Percent: 2 %
Brady Statistic AS VS Percent: 0.1 % — CL
Date Time Interrogation Session: 20180223093028
Implantable Lead Implant Date: 20130730
Implantable Lead Implant Date: 20130730
Implantable Lead Location: 753859
Implantable Lead Location: 753860
Implantable Lead Model: 5076
Implantable Lead Model: 5092
Implantable Pulse Generator Implant Date: 20130730
Lead Channel Impedance Value: 485 Ohm
Lead Channel Impedance Value: 558 Ohm
Lead Channel Pacing Threshold Amplitude: 0.875 V
Lead Channel Pacing Threshold Amplitude: 0.875 V
Lead Channel Pacing Threshold Pulse Width: 0.4 ms
Lead Channel Pacing Threshold Pulse Width: 0.4 ms
Lead Channel Setting Pacing Amplitude: 1.75 V
Lead Channel Setting Pacing Amplitude: 2 V
Lead Channel Setting Pacing Pulse Width: 0.4 ms
Lead Channel Setting Sensing Sensitivity: 5.6 mV

## 2016-10-22 ENCOUNTER — Ambulatory Visit (INDEPENDENT_AMBULATORY_CARE_PROVIDER_SITE_OTHER): Payer: Medicare Other | Admitting: *Deleted

## 2016-10-22 DIAGNOSIS — I442 Atrioventricular block, complete: Secondary | ICD-10-CM | POA: Diagnosis not present

## 2016-10-22 NOTE — Progress Notes (Signed)
Remote pacemaker transmission.   

## 2016-10-23 ENCOUNTER — Encounter: Payer: Self-pay | Admitting: Cardiology

## 2016-10-27 LAB — CUP PACEART REMOTE DEVICE CHECK
Battery Impedance: 347 Ohm
Battery Remaining Longevity: 98 mo
Battery Voltage: 2.79 V
Brady Statistic AP VS Percent: 0 %
Brady Statistic AS VP Percent: 2 %
Implantable Lead Location: 753859
Implantable Lead Model: 5076
Implantable Lead Model: 5092
Implantable Pulse Generator Implant Date: 20130730
Lead Channel Impedance Value: 515 Ohm
Lead Channel Impedance Value: 526 Ohm
Lead Channel Pacing Threshold Amplitude: 0.875 V
Lead Channel Pacing Threshold Pulse Width: 0.4 ms
Lead Channel Setting Pacing Amplitude: 1.75 V
Lead Channel Setting Pacing Amplitude: 2 V
Lead Channel Setting Pacing Pulse Width: 0.4 ms
MDC IDC LEAD IMPLANT DT: 20130730
MDC IDC LEAD IMPLANT DT: 20130730
MDC IDC LEAD LOCATION: 753860
MDC IDC MSMT LEADCHNL RV PACING THRESHOLD AMPLITUDE: 0.875 V
MDC IDC MSMT LEADCHNL RV PACING THRESHOLD PULSEWIDTH: 0.4 ms
MDC IDC SESS DTM: 20180524161012
MDC IDC SET LEADCHNL RV SENSING SENSITIVITY: 5.6 mV
MDC IDC STAT BRADY AP VP PERCENT: 98 %
MDC IDC STAT BRADY AS VS PERCENT: 0 %

## 2017-01-21 ENCOUNTER — Ambulatory Visit (INDEPENDENT_AMBULATORY_CARE_PROVIDER_SITE_OTHER): Payer: Medicare Other | Admitting: *Deleted

## 2017-01-21 DIAGNOSIS — I442 Atrioventricular block, complete: Secondary | ICD-10-CM

## 2017-01-21 NOTE — Progress Notes (Signed)
Remote pacemaker transmission.   

## 2017-01-22 LAB — CUP PACEART REMOTE DEVICE CHECK
Brady Statistic AP VS Percent: 0 %
Brady Statistic AS VS Percent: 0 %
Date Time Interrogation Session: 20180823124543
Implantable Lead Implant Date: 20130730
Implantable Lead Location: 753860
Lead Channel Impedance Value: 502 Ohm
Lead Channel Pacing Threshold Amplitude: 1 V
Lead Channel Pacing Threshold Pulse Width: 0.4 ms
Lead Channel Pacing Threshold Pulse Width: 0.4 ms
Lead Channel Setting Pacing Amplitude: 1.875
Lead Channel Setting Sensing Sensitivity: 5.6 mV
MDC IDC LEAD IMPLANT DT: 20130730
MDC IDC LEAD LOCATION: 753859
MDC IDC MSMT BATTERY IMPEDANCE: 396 Ohm
MDC IDC MSMT BATTERY REMAINING LONGEVITY: 93 mo
MDC IDC MSMT BATTERY VOLTAGE: 2.79 V
MDC IDC MSMT LEADCHNL RA IMPEDANCE VALUE: 541 Ohm
MDC IDC MSMT LEADCHNL RA PACING THRESHOLD AMPLITUDE: 0.875 V
MDC IDC PG IMPLANT DT: 20130730
MDC IDC SET LEADCHNL RV PACING AMPLITUDE: 2 V
MDC IDC SET LEADCHNL RV PACING PULSEWIDTH: 0.4 ms
MDC IDC STAT BRADY AP VP PERCENT: 98 %
MDC IDC STAT BRADY AS VP PERCENT: 2 %

## 2017-01-29 ENCOUNTER — Encounter: Payer: Self-pay | Admitting: Cardiology

## 2017-04-29 ENCOUNTER — Ambulatory Visit (INDEPENDENT_AMBULATORY_CARE_PROVIDER_SITE_OTHER): Payer: Medicare Other | Admitting: *Deleted

## 2017-04-29 DIAGNOSIS — I442 Atrioventricular block, complete: Secondary | ICD-10-CM | POA: Diagnosis not present

## 2017-04-29 NOTE — Progress Notes (Signed)
Remote pacemaker transmission.   

## 2017-04-30 ENCOUNTER — Encounter: Payer: Self-pay | Admitting: Cardiology

## 2017-05-03 LAB — CUP PACEART REMOTE DEVICE CHECK
Battery Impedance: 445 Ohm
Battery Voltage: 2.79 V
Brady Statistic AP VS Percent: 0 %
Brady Statistic AS VP Percent: 2 %
Implantable Lead Location: 753859
Implantable Lead Model: 5076
Implantable Lead Model: 5092
Lead Channel Pacing Threshold Amplitude: 0.875 V
Lead Channel Pacing Threshold Pulse Width: 0.4 ms
Lead Channel Setting Pacing Amplitude: 2 V
Lead Channel Setting Pacing Pulse Width: 0.4 ms
MDC IDC LEAD IMPLANT DT: 20130730
MDC IDC LEAD IMPLANT DT: 20130730
MDC IDC LEAD LOCATION: 753860
MDC IDC MSMT BATTERY REMAINING LONGEVITY: 90 mo
MDC IDC MSMT LEADCHNL RA IMPEDANCE VALUE: 533 Ohm
MDC IDC MSMT LEADCHNL RV IMPEDANCE VALUE: 512 Ohm
MDC IDC MSMT LEADCHNL RV PACING THRESHOLD AMPLITUDE: 0.875 V
MDC IDC MSMT LEADCHNL RV PACING THRESHOLD PULSEWIDTH: 0.4 ms
MDC IDC PG IMPLANT DT: 20130730
MDC IDC SESS DTM: 20181129140020
MDC IDC SET LEADCHNL RA PACING AMPLITUDE: 1.75 V
MDC IDC SET LEADCHNL RV SENSING SENSITIVITY: 5.6 mV
MDC IDC STAT BRADY AP VP PERCENT: 98 %
MDC IDC STAT BRADY AS VS PERCENT: 0 %

## 2017-07-19 ENCOUNTER — Encounter: Payer: Self-pay | Admitting: Internal Medicine

## 2017-07-19 ENCOUNTER — Ambulatory Visit (INDEPENDENT_AMBULATORY_CARE_PROVIDER_SITE_OTHER): Payer: Medicare Other | Admitting: Internal Medicine

## 2017-07-19 VITALS — BP 130/70 | HR 87 | Ht 67.0 in | Wt 160.0 lb

## 2017-07-19 DIAGNOSIS — I10 Essential (primary) hypertension: Secondary | ICD-10-CM | POA: Diagnosis not present

## 2017-07-19 NOTE — Progress Notes (Signed)
HPI Mr. Kagel returns today for followup. 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 The patient also has a h/o BPH and is s/p prostatectomy with indwelling suprapubic catheter. The patient has not fallen. He denies sob. No edema. He is now pacing 99% of the time. As time has gone on is energy level has gradually decreased. Allergies  Allergen Reactions  . Penicillins Other (See Comments)    Has patient had a PCN reaction causing immediate rash, facial/tongue/throat swelling, SOB or lightheadedness with hypotension: unknown Has patient had a PCN reaction causing severe rash involving mucus membranes or skin necrosis: unknown Has patient had a PCN reaction that required hospitalization unknown Has patient had a PCN reaction occurring within the last 10 years: unknown If all of the above answers are "NO", then may proceed with Cephalosporin use.      Childhood allergy     Current Outpatient Medications  Medication Sig Dispense Refill  . aspirin EC 81 MG tablet Take 81 mg by mouth every morning.    . Cyanocobalamin (VITAMIN B 12 PO) Take 1 tablet by mouth daily.    . hydrochlorothiazide (HYDRODIURIL) 25 MG tablet Take 25 mg by mouth every morning.     Marland Kitchen lisinopril (PRINIVIL,ZESTRIL) 20 MG tablet Take 20 mg by mouth 2 (two) times daily.     Marland Kitchen LORazepam (ATIVAN) 1 MG tablet Take 1 mg by mouth at bedtime.    . metoprolol (LOPRESSOR) 100 MG tablet Take 50 mg by mouth 2 (two) times daily.     . potassium chloride SA (K-DUR,KLOR-CON) 20 MEQ tablet Take 20 mEq by mouth every morning.     . simvastatin (ZOCOR) 20 MG tablet Take 20 mg by mouth every morning.      No current facility-administered medications for this visit.      Past Medical History:  Diagnosis Date  . CAD (coronary artery disease)    a.  90% LAD stenosis in 1995 treated with PTCA;  b. 09/2009 CABG x 4: LIMA->LAD, VG->Diag, VG->OM, VG->PDA;   c. 11/2011 Cath: 3VD, 4/4 patent grafts, EF 70%.  . Colonic polyp   . Complete heart block (HCC)    a. 11/2011 s/p MDT Adapta L ADDRL 1 Ser # [id number] H.  . Complication of anesthesia    PROBLEMS VOIDING AFTER PREVIOUS SURGERIES  . Diabetes mellitus without complication (HCC)    NOT ON ANY DIABETIC MEDICATIONS  . Fasting hyperglycemia   . Hyperlipidemia   . Hypertension   . Pacemaker JULY 2013   DR.  G.Tionne Dayhoff AND DR. Tommie Sams  . Seizures (HCC)    a. prior to diagnosis of heart block and syncope, this was in the differential and pt was briefly on Keppra, started by ER MD.  . Syncope    a. 11/2011 18 second pauses noted on Event Monitor assoc w/ syncope  . Trauma 1952   Abdominal and chest in 1952; multiple injuries including pelvic fracture, rib fractures and ruptured bladder  . Urinary retention    PT HAS INDWELLING FOLEY CATHETER    ROS:   All systems reviewed and negative except as noted in the HPI.   Past Surgical History:  Procedure Laterality Date  . BLADDER SURGERY  1950'S  . CATARACT EXTRACTION W/PHACO Right 11/22/2012   Procedure: CATARACT EXTRACTION PHACO AND INTRAOCULAR LENS PLACEMENT (IOC);  Surgeon: Loraine Leriche T. Nile Riggs, MD;  Location: [facility];  Service: Ophthalmology;  Laterality: Right;  CDE 10.27  . CATARACT EXTRACTION W/PHACO Left 12/20/2012   Procedure: CATARACT EXTRACTION PHACO AND INTRAOCULAR LENS PLACEMENT (IOC);  Surgeon: Loraine LericheMark T. Nile RiggsShapiro, MD;  Location: [facility];  Service: Ophthalmology;  Laterality: Left;  CDE:6.67  . COLONOSCOPY W/ POLYPECTOMY    . CORONARY ARTERY BYPASS GRAFT  09/2009   4 vessels  . FEMORAL HERNIA REPAIR    . INSERTION OF SUPRAPUBIC CATHETER  04/04/2012   Procedure: INSERTION OF SUPRAPUBIC CATHETER;  Surgeon: Garnett FarmMark C Ottelin, MD;  Location: [facility];  Service: Urology;  Laterality: N/A;  . LEAD REVISION N/A 12/30/2011   Procedure: LEAD REVISION;  Surgeon: Marinus MawGregg W Floretta Petro, MD;  [facility];  Service: Cardiovascular;  Laterality: N/A;  . LEFT  HEART CATHETERIZATION WITH CORONARY/GRAFT ANGIOGRAM  12/29/2011   Procedure: LEFT HEART CATHETERIZATION WITH Isabel CapriceORONARY/GRAFT ANGIOGRAM;  Surgeon: Tonny BollmanMichael Cooper, MD;  [facility];  Service: Cardiovascular;;  . PACEMAKER PLACEMENT    . TEMPORARY PACEMAKER INSERTION N/A 12/29/2011   Procedure: TEMPORARY PACEMAKER INSERTION;  Surgeon: Tonny BollmanMichael Cooper, MD;  [facility];  Service: Cardiovascular;  Laterality: N/A;  . TRANSURETHRAL PROSTATECTOMY WITH GYRUS INSTRUMENTS  04/04/2012   Procedure: TRANSURETHRAL PROSTATECTOMY WITH GYRUS INSTRUMENTS;  Surgeon: Garnett FarmMark C Ottelin, MD;  Location: [facility];  Service: Urology;  Laterality: N/A;  TURP with Gyrus and Suprapubic Tube Placement  . YAG LASER APPLICATION Left 06/25/2015   Procedure: YAG LASER APPLICATION;  Surgeon: Jethro BolusMark Shapiro, MD;  Location: [facility];  Service: Ophthalmology;  Laterality: Left;     Family History  Problem Relation Age of Onset  . Heart block Mother   . Heart murmur Mother   . Aneurysm Father   . Coronary artery disease Brother        x3  . Lung cancer Brother      Social History   Socioeconomic History  . Marital status: Married    Spouse name: Not on file  . Number of children: Not on file  . Years of education: Not on file  . Highest education level: Not on file  Social Needs  . Financial resource strain: Not on file  . Food insecurity - worry: Not on file  . Food insecurity - inability: Not on file  . Transportation needs - medical: Not on file  . Transportation needs - non-medical: Not on file  Occupational History  . Occupation: Retired  Tobacco Use  . Smoking status: Never Smoker  . Smokeless tobacco: Current User    Types: Chew  Substance and Sexual Activity  . Alcohol use: No  . Drug use: No  . Sexual activity: Yes  Other Topics Concern  . Not on file  Social History Narrative  . Not on file     BP 130/70   Pulse 87   Ht 5\' 7"  (1.702 m)   Wt 160 lb (72.6 kg)   SpO2 97%   BMI 25.06  kg/m   Physical Exam:  Well appearing elderly man, NAD HEENT: Unremarkable Neck:  6 cm JVD, no thyromegally Lymphatics:  No adenopathy Back:  No CVA tenderness Lungs:  Clear with no wheezes HEART:  Regular rate rhythm, no murmurs, no rubs, no clicks Abd:  soft, positive bowel sounds, no organomegally, no rebound, no guarding Ext:  2 plus pulses, no edema, no cyanosis, no clubbing Skin:  No rashes no nodules Neuro:  CN II through XII intact, motor grossly intact  DEVICE  Normal device function.  See  PaceArt for details.   Assess/Plan: 1. PPM - his medtronic DDD PM is working normally. Will recheck in several months 2. HTN - his blood pressure is well controlled. No change in meds. 3. Syncope - he has not had any additional syncope since his last visit.   Leonia Reeves.D.

## 2017-07-19 NOTE — Patient Instructions (Signed)
Medication Instructions:  Your physician recommends that you continue on your current medications as directed. Please refer to the Current Medication list given to you today.   Labwork: NONE   Testing/Procedures: NONE   Follow-Up: Your physician wants you to follow-up in: 1 Year with Dr. Taylor. You will receive a reminder letter in the mail two months in advance. If you don't receive a letter, please call our office to schedule the follow-up appointment.   Any Other Special Instructions Will Be Listed Below (If Applicable).     If you need a refill on your cardiac medications before your next appointment, please call your pharmacy.  Thank you for choosing [facility]!   

## 2017-07-29 ENCOUNTER — Telehealth: Payer: Self-pay | Admitting: Internal Medicine

## 2017-07-29 ENCOUNTER — Telehealth: Payer: Self-pay | Admitting: Cardiology

## 2017-07-29 ENCOUNTER — Ambulatory Visit (INDEPENDENT_AMBULATORY_CARE_PROVIDER_SITE_OTHER): Payer: Medicare Other | Admitting: *Deleted

## 2017-07-29 DIAGNOSIS — I442 Atrioventricular block, complete: Secondary | ICD-10-CM

## 2017-07-29 NOTE — Telephone Encounter (Signed)
Called pt and asked him to re-send transmission again. Pt stated that he would.

## 2017-07-29 NOTE — Telephone Encounter (Signed)
Transmission received.

## 2017-07-29 NOTE — Telephone Encounter (Signed)
New Message    1. Has your device fired? no  2. Is you device beeping? no  3. Are you experiencing draining or swelling at device site? no  4. Are you calling to see if we received your device transmission? Yes , pt says that its blinking green and yellow   5. Have you passed out? no    Please route to Device Clinic Pool

## 2017-07-29 NOTE — Telephone Encounter (Signed)
Confirmed remote transmission w/ pt wife.   

## 2017-07-30 ENCOUNTER — Encounter: Payer: Self-pay | Admitting: Cardiology

## 2017-07-30 NOTE — Progress Notes (Signed)
Remote pacemaker transmission.   

## 2017-08-14 LAB — CUP PACEART REMOTE DEVICE CHECK
Battery Remaining Longevity: 86 mo
Date Time Interrogation Session: 20190228205400
Implantable Lead Implant Date: 20130730
Implantable Lead Location: 753860
Implantable Pulse Generator Implant Date: 20130730
Lead Channel Pacing Threshold Amplitude: 1 V
Lead Channel Pacing Threshold Pulse Width: 0.4 ms
Lead Channel Pacing Threshold Pulse Width: 0.4 ms
Lead Channel Setting Pacing Amplitude: 1.75 V
Lead Channel Setting Sensing Sensitivity: 5.6 mV
MDC IDC LEAD IMPLANT DT: 20130730
MDC IDC LEAD LOCATION: 753859
MDC IDC MSMT BATTERY IMPEDANCE: 495 Ohm
MDC IDC MSMT BATTERY VOLTAGE: 2.79 V
MDC IDC MSMT LEADCHNL RA IMPEDANCE VALUE: 558 Ohm
MDC IDC MSMT LEADCHNL RA PACING THRESHOLD AMPLITUDE: 0.875 V
MDC IDC MSMT LEADCHNL RV IMPEDANCE VALUE: 519 Ohm
MDC IDC SET LEADCHNL RV PACING AMPLITUDE: 2 V
MDC IDC SET LEADCHNL RV PACING PULSEWIDTH: 0.4 ms
MDC IDC STAT BRADY AP VP PERCENT: 98 %
MDC IDC STAT BRADY AP VS PERCENT: 0 %
MDC IDC STAT BRADY AS VP PERCENT: 2 %
MDC IDC STAT BRADY AS VS PERCENT: 0 %

## 2017-10-28 ENCOUNTER — Ambulatory Visit (INDEPENDENT_AMBULATORY_CARE_PROVIDER_SITE_OTHER): Payer: Medicare Other | Admitting: *Deleted

## 2017-10-28 DIAGNOSIS — I442 Atrioventricular block, complete: Secondary | ICD-10-CM | POA: Diagnosis not present

## 2017-10-28 NOTE — Progress Notes (Signed)
Remote pacemaker transmission.   

## 2017-11-01 ENCOUNTER — Encounter: Payer: Self-pay | Admitting: Cardiology

## 2017-11-03 LAB — CUP PACEART REMOTE DEVICE CHECK
Battery Impedance: 568 Ohm
Brady Statistic AP VP Percent: 98 %
Brady Statistic AP VS Percent: 0 %
Brady Statistic AS VS Percent: 0 %
Date Time Interrogation Session: 20190530190054
Implantable Lead Implant Date: 20130730
Implantable Lead Location: 753859
Implantable Lead Model: 5076
Lead Channel Impedance Value: 530 Ohm
Lead Channel Impedance Value: 534 Ohm
Lead Channel Pacing Threshold Amplitude: 0.875 V
Lead Channel Pacing Threshold Pulse Width: 0.4 ms
Lead Channel Setting Pacing Amplitude: 2.5 V
MDC IDC LEAD IMPLANT DT: 20130730
MDC IDC LEAD LOCATION: 753860
MDC IDC MSMT BATTERY REMAINING LONGEVITY: 73 mo
MDC IDC MSMT BATTERY VOLTAGE: 2.78 V
MDC IDC MSMT LEADCHNL RV PACING THRESHOLD AMPLITUDE: 1.25 V
MDC IDC MSMT LEADCHNL RV PACING THRESHOLD PULSEWIDTH: 0.4 ms
MDC IDC PG IMPLANT DT: 20130730
MDC IDC SET LEADCHNL RA PACING AMPLITUDE: 1.875
MDC IDC SET LEADCHNL RV PACING PULSEWIDTH: 0.4 ms
MDC IDC SET LEADCHNL RV SENSING SENSITIVITY: 5.6 mV
MDC IDC STAT BRADY AS VP PERCENT: 2 %

## 2018-01-17 ENCOUNTER — Ambulatory Visit (HOSPITAL_COMMUNITY)
Admission: RE | Admit: 2018-01-17 | Discharge: 2018-01-17 | Disposition: A | Discharge status: Home / Self Care | Payer: Medicare Other | Source: Ambulatory Visit | Attending: Family Medicine | Admitting: Family Medicine

## 2018-01-17 ENCOUNTER — Other Ambulatory Visit (HOSPITAL_COMMUNITY): Payer: Self-pay | Admitting: Family Medicine

## 2018-01-17 DIAGNOSIS — I700 Atherosclerosis of aorta: Secondary | ICD-10-CM | POA: Diagnosis not present

## 2018-01-17 DIAGNOSIS — R109 Unspecified abdominal pain: Secondary | ICD-10-CM | POA: Diagnosis present

## 2018-01-17 DIAGNOSIS — K5730 Diverticulosis of large intestine without perforation or abscess without bleeding: Secondary | ICD-10-CM | POA: Diagnosis not present

## 2018-01-17 DIAGNOSIS — K5900 Constipation, unspecified: Secondary | ICD-10-CM | POA: Insufficient documentation

## 2018-01-17 LAB — POCT I-STAT CREATININE: CREATININE: 0.9 mg/dL (ref 0.61–1.24)

## 2018-01-17 MED ORDER — IOPAMIDOL (ISOVUE-300) INJECTION 61%
100.0000 mL | Freq: Once | INTRAVENOUS | Status: AC | PRN
  Administered 2018-01-17: 100 mL via INTRAVENOUS

## 2018-01-27 ENCOUNTER — Ambulatory Visit (INDEPENDENT_AMBULATORY_CARE_PROVIDER_SITE_OTHER): Payer: Medicare Other | Admitting: *Deleted

## 2018-01-27 DIAGNOSIS — I442 Atrioventricular block, complete: Secondary | ICD-10-CM | POA: Diagnosis not present

## 2018-01-27 NOTE — Progress Notes (Signed)
Remote pacemaker transmission.   

## 2018-01-28 ENCOUNTER — Encounter: Payer: Self-pay | Admitting: Cardiology

## 2018-02-14 ENCOUNTER — Ambulatory Visit (INDEPENDENT_AMBULATORY_CARE_PROVIDER_SITE_OTHER): Payer: Medicare Other | Admitting: Vascular Surgery

## 2018-02-14 ENCOUNTER — Encounter: Payer: Self-pay | Admitting: Vascular Surgery

## 2018-02-14 VITALS — BP 148/73 | HR 79 | Temp 97.3°F | Resp 18 | Ht 67.0 in | Wt 154.0 lb

## 2018-02-14 DIAGNOSIS — I719 Aortic aneurysm of unspecified site, without rupture: Secondary | ICD-10-CM | POA: Diagnosis not present

## 2018-02-14 NOTE — Progress Notes (Signed)
[facility] [place]  Patient name: Guy Roberson MRN: [id number] DOB: 22-Nov-1931 Sex: male  REASON FOR CONSULT: Evaluation of incidental finding of penetrating ulcer infrarenal abdominal aortic aneurysm  Seen today in our [place] office  HPI: Guy Roberson is a 84 y.o. 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  He is here today with his wife.  He has a several month history of discomfort in his abdomen.  He reports that this begins in his right lateral abdomen extends then towards the midline in the right subcostal area.  This is not related to activity.  He reports that it is more noticeable when he is at rest sitting in a chair.  He does report that drinking milk can sometimes relieve this.  Interestingly his father most likely died of a ruptured infrarenal abdominal aortic aneurysm.  Describes the events around his death that was the presumption at that time.  He has no history of aneurysmal disease himself.  Does have a history of cardiac disease also has a history of chronic constipation  Past Medical History:  Diagnosis Date  . CAD (coronary artery disease)    a.  90% LAD stenosis in 1995 treated with PTCA;  b. 09/2009 CABG x 4: LIMA->LAD, VG->Diag, VG->OM, VG->PDA;  c. 11/2011 Cath: 3VD, 4/4 patent grafts, EF 70%.  . Colonic polyp   . Complete heart block (HCC)    a. 11/2011 s/p MDT Adapta L ADDRL 1 Ser # [id number] H.  . Complication of anesthesia    PROBLEMS VOIDING AFTER PREVIOUS SURGERIES  . Diabetes mellitus without complication (HCC)    NOT ON ANY DIABETIC MEDICATIONS  . Fasting hyperglycemia   . Hyperlipidemia   . Hypertension   . Pacemaker JULY 2013   DR.  G.TAYLOR AND DR. Tommie Sams  . Seizures (HCC)    a. prior to diagnosis of heart block and syncope, this was in the differential and pt was briefly on Keppra, started by ER MD.  . Syncope     a. 11/2011 18 second pauses noted on Event Monitor assoc w/ syncope  . Trauma 1952   Abdominal and chest in 1952; multiple injuries including pelvic fracture, rib fractures and ruptured bladder  . Urinary retention    PT HAS INDWELLING FOLEY CATHETER    Family History  Problem Relation Age of Onset  . Heart block Mother   . Heart murmur Mother   . Aneurysm Father   . Coronary artery disease Brother        x3  . Lung cancer Brother     SOCIAL HISTORY: Social History   Socioeconomic History  . Marital status: Married    Spouse name: Not on file  . Number of children: Not on file  . Years of education: Not on file  . Highest education level: Not on file  Occupational History  . Occupation: Retired  Engineer, production  . Financial resource strain: Not on file  . Food insecurity:    Worry: Not on file    Inability: Not on file  . Transportation needs:    Medical: Not on file    Non-medical: Not on file  Tobacco Use  . Smoking status: Never Smoker  . Smokeless tobacco: Current User    Types: Chew  Substance and Sexual Activity  . Alcohol use: No  . Drug use: No  . Sexual activity: Yes  Lifestyle  .  Physical activity:    Days per week: Not on file    Minutes per session: Not on file  . Stress: Not on file  Relationships  . Social connections:    Talks on phone: Not on file    Gets together: Not on file    Attends religious service: Not on file    Active member of club or organization: Not on file    Attends meetings of clubs or organizations: Not on file    Relationship status: Not on file  . Intimate partner violence:    Fear of current or ex partner: Not on file    Emotionally abused: Not on file    Physically abused: Not on file    Forced sexual activity: Not on file  Other Topics Concern  . Not on file  Social History Narrative  . Not on file    Allergies  Allergen Reactions  . Penicillins Other (See Comments)    Has patient had a PCN reaction causing  immediate rash, facial/tongue/throat swelling, SOB or lightheadedness with hypotension: unknown Has patient had a PCN reaction causing severe rash involving mucus membranes or skin necrosis: unknown Has patient had a PCN reaction that required hospitalization unknown Has patient had a PCN reaction occurring within the last 10 years: unknown If all of the above answers are "NO", then may proceed with Cephalosporin use.      Childhood allergy    Current Outpatient Medications  Medication Sig Dispense Refill  . aspirin EC 81 MG tablet Take 81 mg by mouth every morning.    . Cyanocobalamin (VITAMIN B 12 PO) Take 1 tablet by mouth daily.    Marland Kitchen lisinopril (PRINIVIL,ZESTRIL) 20 MG tablet Take 20 mg by mouth 2 (two) times daily.     Marland Kitchen LORazepam (ATIVAN) 1 MG tablet Take 1 mg by mouth at bedtime.    . metoprolol (LOPRESSOR) 100 MG tablet Take 50 mg by mouth 2 (two) times daily.     . potassium chloride SA (K-DUR,KLOR-CON) 20 MEQ tablet Take 20 mEq by mouth every morning.     . simvastatin (ZOCOR) 20 MG tablet Take 20 mg by mouth every morning.     . hydrochlorothiazide (HYDRODIURIL) 25 MG tablet Take 25 mg by mouth every morning.      No current facility-administered medications for this visit.     REVIEW OF SYSTEMS:  [X]  denotes positive finding, [ ]  denotes negative finding Cardiac  Comments:  Chest pain or chest pressure:    Shortness of breath upon exertion: x   Short of breath when lying flat:    Irregular heart rhythm:        Vascular    Pain in calf, thigh, or hip brought on by ambulation: x   Pain in feet at night that wakes you up from your sleep:     Blood clot in your veins:    Leg swelling:         Pulmonary    Oxygen at home:    Productive cough:     Wheezing:         Neurologic    Sudden weakness in arms or legs:     Sudden numbness in arms or legs:     Sudden onset of difficulty speaking or slurred speech:    Temporary loss of vision in one eye:     Problems  with dizziness:         Gastrointestinal    Blood in stool:  Vomited blood:         Genitourinary    Burning when urinating:     Blood in urine:        Psychiatric    Major depression:         Hematologic    Bleeding problems:    Problems with blood clotting too easily:        Skin    Rashes or ulcers: x       Constitutional    Fever or chills:      PHYSICAL EXAM: Vitals:   02/14/18 0852  BP: (!) 148/73  Pulse: 79  Resp: 18  Temp: (!) 97.3 F (36.3 C)  TempSrc: Temporal  Weight: 154 lb (69.9 kg)  Height: 5\' 7"  (1.702 m)    GENERAL: The patient is a well-nourished male, in no acute distress. The vital signs are documented above. CARDIOVASCULAR: Carotid arteries without bruits bilaterally.  2+ radial 2+ femoral 2+ popliteal and 2+ dorsalis pedis pulses bilaterally PULMONARY: There is good air exchange  ABDOMEN: Soft and non-tender area no aneurysm palpable MUSCULOSKELETAL: There are no major deformities or cyanosis. NEUROLOGIC: No focal weakness or paresthesias are detected. SKIN: There are no ulcers or rashes noted. PSYCHIATRIC: The patient has a normal affect.  DATA:  Reviewed his CT scan images.  This does show a small area probable penetrating ulcer in the lateral posterior infrarenal aorta.  Maximal size of this is less than 1-1/2 cm.  MEDICAL ISSUES: I discussed this finding with the patient and his wife in detail.  Sprain that this is not responsible for his symptoms and is an incidental finding.  Calcification around this area suggest that this is chronic.  I would not recommend repeat imaging in the future and feel that there is no risk from this going forward.  They were relieved with this discussion and will see me again on an as-needed basis   Larina Earthlyodd F. Oma Alpert, MD [facility] Office Tel [phone] Pager [phone]

## 2018-02-18 LAB — CUP PACEART REMOTE DEVICE CHECK
Battery Voltage: 2.78 V
Brady Statistic AP VP Percent: 98 %
Brady Statistic AP VS Percent: 0 %
Brady Statistic AS VP Percent: 2 %
Implantable Lead Location: 753860
Implantable Lead Model: 5092
Implantable Pulse Generator Implant Date: 20130730
Lead Channel Impedance Value: 567 Ohm
Lead Channel Pacing Threshold Amplitude: 1 V
Lead Channel Pacing Threshold Amplitude: 1 V
Lead Channel Pacing Threshold Pulse Width: 0.4 ms
Lead Channel Pacing Threshold Pulse Width: 0.4 ms
MDC IDC LEAD IMPLANT DT: 20130730
MDC IDC LEAD IMPLANT DT: 20130730
MDC IDC LEAD LOCATION: 753859
MDC IDC MSMT BATTERY IMPEDANCE: 643 Ohm
MDC IDC MSMT BATTERY REMAINING LONGEVITY: 76 mo
MDC IDC MSMT LEADCHNL RV IMPEDANCE VALUE: 554 Ohm
MDC IDC SESS DTM: 20190829125835
MDC IDC SET LEADCHNL RA PACING AMPLITUDE: 2 V
MDC IDC SET LEADCHNL RV PACING AMPLITUDE: 2 V
MDC IDC SET LEADCHNL RV PACING PULSEWIDTH: 0.4 ms
MDC IDC SET LEADCHNL RV SENSING SENSITIVITY: 5.6 mV
MDC IDC STAT BRADY AS VS PERCENT: 0 %

## 2018-04-12 ENCOUNTER — Ambulatory Visit (HOSPITAL_COMMUNITY): Payer: Medicare Other

## 2018-04-12 ENCOUNTER — Ambulatory Visit (HOSPITAL_COMMUNITY)
Admission: RE | Admit: 2018-04-12 | Discharge: 2018-04-12 | Disposition: A | Discharge status: Home / Self Care | Payer: Medicare Other | Source: Ambulatory Visit | Attending: Family Medicine | Admitting: Family Medicine

## 2018-04-12 ENCOUNTER — Other Ambulatory Visit (HOSPITAL_COMMUNITY): Payer: Self-pay | Admitting: Family Medicine

## 2018-04-12 DIAGNOSIS — M545 Low back pain: Secondary | ICD-10-CM | POA: Diagnosis present

## 2018-04-12 DIAGNOSIS — K5900 Constipation, unspecified: Secondary | ICD-10-CM | POA: Diagnosis not present

## 2018-04-12 DIAGNOSIS — W19XXXA Unspecified fall, initial encounter: Secondary | ICD-10-CM | POA: Diagnosis not present

## 2018-04-12 DIAGNOSIS — M47896 Other spondylosis, lumbar region: Secondary | ICD-10-CM | POA: Diagnosis not present

## 2018-05-05 ENCOUNTER — Ambulatory Visit (INDEPENDENT_AMBULATORY_CARE_PROVIDER_SITE_OTHER): Payer: Medicare Other

## 2018-05-05 ENCOUNTER — Telehealth: Payer: Self-pay

## 2018-05-05 DIAGNOSIS — I442 Atrioventricular block, complete: Secondary | ICD-10-CM

## 2018-05-05 NOTE — Telephone Encounter (Signed)
Spoke with pt and reminded pt of remote transmission that is due today. Pt verbalized understanding.   

## 2018-05-09 NOTE — Progress Notes (Signed)
Remote pacemaker transmission.   

## 2018-05-11 ENCOUNTER — Encounter: Payer: Self-pay | Admitting: Cardiology

## 2018-06-20 LAB — CUP PACEART REMOTE DEVICE CHECK
Brady Statistic AP VS Percent: 0 %
Brady Statistic AS VS Percent: 0 %
Date Time Interrogation Session: 20191205165803
Implantable Lead Implant Date: 20130730
Implantable Lead Location: 753859
Implantable Lead Location: 753860
Implantable Lead Model: 5076
Lead Channel Impedance Value: 490 Ohm
Lead Channel Pacing Threshold Amplitude: 0.875 V
Lead Channel Pacing Threshold Pulse Width: 0.4 ms
Lead Channel Pacing Threshold Pulse Width: 0.4 ms
Lead Channel Setting Pacing Amplitude: 1.75 V
Lead Channel Setting Pacing Amplitude: 2 V
Lead Channel Setting Sensing Sensitivity: 5.6 mV
MDC IDC LEAD IMPLANT DT: 20130730
MDC IDC MSMT BATTERY IMPEDANCE: 693 Ohm
MDC IDC MSMT BATTERY REMAINING LONGEVITY: 73 mo
MDC IDC MSMT BATTERY VOLTAGE: 2.78 V
MDC IDC MSMT LEADCHNL RA IMPEDANCE VALUE: 517 Ohm
MDC IDC MSMT LEADCHNL RA PACING THRESHOLD AMPLITUDE: 0.875 V
MDC IDC PG IMPLANT DT: 20130730
MDC IDC SET LEADCHNL RV PACING PULSEWIDTH: 0.4 ms
MDC IDC STAT BRADY AP VP PERCENT: 98 %
MDC IDC STAT BRADY AS VP PERCENT: 2 %

## 2018-07-29 ENCOUNTER — Ambulatory Visit (INDEPENDENT_AMBULATORY_CARE_PROVIDER_SITE_OTHER): Payer: Medicare Other | Admitting: Internal Medicine

## 2018-07-29 ENCOUNTER — Encounter: Payer: Self-pay | Admitting: Internal Medicine

## 2018-07-29 DIAGNOSIS — I442 Atrioventricular block, complete: Secondary | ICD-10-CM

## 2018-07-29 DIAGNOSIS — Z950 Presence of cardiac pacemaker: Secondary | ICD-10-CM | POA: Diagnosis not present

## 2018-07-29 NOTE — Progress Notes (Signed)
HPI Mr. Steeg returns today for followup. He is a pleasant 85 yo man with a h/o stokes adams syncope, s/p PPM insertion after having over 10 second pauses. The patient has BPH. He denies chest pain or sob. No syncope.  Allergies  Allergen Reactions  . Penicillins Other (See Comments)    Has patient had a PCN reaction causing immediate rash, facial/tongue/throat swelling, SOB or lightheadedness with hypotension: unknown Has patient had a PCN reaction causing severe rash involving mucus membranes or skin necrosis: unknown Has patient had a PCN reaction that required hospitalization unknown Has patient had a PCN reaction occurring within the last 10 years: unknown If all of the above answers are "NO", then may proceed with Cephalosporin use.      Childhood allergy     Current Outpatient Medications  Medication Sig Dispense Refill  . aspirin EC 81 MG tablet Take 81 mg by mouth every morning.    . Cyanocobalamin (VITAMIN B 12 PO) Take 1 tablet by mouth daily.    . hydrochlorothiazide (HYDRODIURIL) 25 MG tablet Take 25 mg by mouth every morning.     Marland Kitchen lisinopril (PRINIVIL,ZESTRIL) 20 MG tablet Take 20 mg by mouth 2 (two) times daily.     Marland Kitchen LORazepam (ATIVAN) 1 MG tablet Take 1 mg by mouth at bedtime.    . metoprolol (LOPRESSOR) 100 MG tablet Take 50 mg by mouth 2 (two) times daily.     . potassium chloride SA (K-DUR,KLOR-CON) 20 MEQ tablet Take 20 mEq by mouth every morning.     . simvastatin (ZOCOR) 20 MG tablet Take 20 mg by mouth every morning.      No current facility-administered medications for this visit.      Past Medical History:  Diagnosis Date  . CAD (coronary artery disease)    a.  90% LAD stenosis in 1995 treated with PTCA;  b. 09/2009 CABG x 4: LIMA->LAD, VG->Diag, VG->OM, VG->PDA;  c. 11/2011 Cath: 3VD, 4/4 patent grafts, EF 70%.  . Colonic polyp   . Complete heart block (HCC)    a. 11/2011 s/p MDT Adapta L ADDRL 1 Ser # [id number] H.  . Complication of  anesthesia    PROBLEMS VOIDING AFTER PREVIOUS SURGERIES  . Diabetes mellitus without complication (HCC)    NOT ON ANY DIABETIC MEDICATIONS  . Fasting hyperglycemia   . Hyperlipidemia   . Hypertension   . Pacemaker JULY 2013   DR.  G.TAYLOR AND DR. Tommie Sams  . Seizures (HCC)    a. prior to diagnosis of heart block and syncope, this was in the differential and pt was briefly on Keppra, started by ER MD.  . Syncope    a. 11/2011 18 second pauses noted on Event Monitor assoc w/ syncope  . Trauma 1952   Abdominal and chest in 1952; multiple injuries including pelvic fracture, rib fractures and ruptured bladder  . Urinary retention    PT HAS INDWELLING FOLEY CATHETER    ROS:   All systems reviewed and negative except as noted in the HPI.   Past Surgical History:  Procedure Laterality Date  . BLADDER SURGERY  1950'S  . CATARACT EXTRACTION W/PHACO Right 11/22/2012   Procedure: CATARACT EXTRACTION PHACO AND INTRAOCULAR LENS PLACEMENT (IOC);  Surgeon: Loraine Leriche T. Nile Riggs, MD;  Location: [facility];  Service: Ophthalmology;  Laterality: Right;  CDE 10.27  . CATARACT EXTRACTION W/PHACO Left 12/20/2012   Procedure: CATARACT EXTRACTION PHACO AND INTRAOCULAR LENS PLACEMENT (IOC);  Surgeon: Loraine Leriche T. Nile Riggs,  MD;  Location: [facility];  Service: Ophthalmology;  Laterality: Left;  CDE:6.67  . COLONOSCOPY W/ POLYPECTOMY    . CORONARY ARTERY BYPASS GRAFT  09/2009   4 vessels  . FEMORAL HERNIA REPAIR    . INSERTION OF SUPRAPUBIC CATHETER  04/04/2012   Procedure: INSERTION OF SUPRAPUBIC CATHETER;  Surgeon: Garnett Farm, MD;  Location: [facility];  Service: Urology;  Laterality: N/A;  . LEAD REVISION N/A 12/30/2011   Procedure: LEAD REVISION;  Surgeon: Marinus Maw, MD;  Location: [facility] CATH LAB;  Service: Cardiovascular;  Laterality: N/A;  . LEFT HEART CATHETERIZATION WITH CORONARY/GRAFT ANGIOGRAM  12/29/2011   Procedure: LEFT HEART CATHETERIZATION WITH Isabel Caprice;  Surgeon: [name], MD;  Location:  [facility] CATH LAB;  Service: Cardiovascular;;  . PACEMAKER PLACEMENT    . TEMPORARY PACEMAKER INSERTION N/A 12/29/2011   Procedure: TEMPORARY PACEMAKER INSERTION;  Surgeon: [name], MD;  Location: [facility] CATH LAB;  Service: Cardiovascular;  Laterality: N/A;  . TRANSURETHRAL PROSTATECTOMY WITH GYRUS INSTRUMENTS  04/04/2012   Procedure: TRANSURETHRAL PROSTATECTOMY WITH GYRUS INSTRUMENTS;  Surgeon: Garnett Farm, MD;  Location: [facility];  Service: Urology;  Laterality: N/A;  TURP with Gyrus and Suprapubic Tube Placement  . YAG LASER APPLICATION Left 06/25/2015   Procedure: YAG LASER APPLICATION;  Surgeon: Jethro Bolus, MD;  Location: [facility];  Service: Ophthalmology;  Laterality: Left;     Family History  Problem Relation Age of Onset  . Heart block Mother   . Heart murmur Mother   . Aneurysm Father   . Coronary artery disease Brother        x3  . Lung cancer Brother      Social History   Socioeconomic History  . Marital status: Married    Spouse name: Not on file  . Number of children: Not on file  . Years of education: Not on file  . Highest education level: Not on file  Occupational History  . Occupation: Retired  Engineer, production  . Financial resource strain: Not on file  . Food insecurity:    Worry: Not on file    Inability: Not on file  . Transportation needs:    Medical: Not on file    Non-medical: Not on file  Tobacco Use  . Smoking status: Never Smoker  . Smokeless tobacco: Current User    Types: Chew  Substance and Sexual Activity  . Alcohol use: No  . Drug use: No  . Sexual activity: Yes  Lifestyle  . Physical activity:    Days per week: Not on file    Minutes per session: Not on file  . Stress: Not on file  Relationships  . Social connections:    Talks on phone: Not on file    Gets together: Not on file    Attends religious service: Not on file    Active member of club or organization: Not on file    Attends meetings of clubs or organizations: Not on file     Relationship status: Not on file  . Intimate partner violence:    Fear of current or ex partner: Not on file    Emotionally abused: Not on file    Physically abused: Not on file    Forced sexual activity: Not on file  Other Topics Concern  . Not on file  Social History Narrative  . Not on file     BP 140/80 (BP Location: Left Arm)   Pulse 77   Ht 5\' 6"  (1.676  m)   Wt 157 lb (71.2 kg)   SpO2 96%   BMI 25.34 kg/m   Physical Exam:  Well appearing NAD HEENT: Unremarkable Neck:  No JVD, no thyromegally Lymphatics:  No adenopathy Back:  No CVA tenderness Lungs:  Clear HEART:  Regular rate rhythm, no murmurs, no rubs, no clicks Abd:  soft, positive bowel sounds, no organomegally, no rebound, no guarding Ext:  2 plus pulses, no edema, no cyanosis, no clubbing Skin:  No rashes no nodules Neuro:  CN II through XII intact, motor grossly intact  DEVICE  Normal device function.  See PaceArt for details.   Assess/Plan: 1. CHB - he is asymptomatic after PPM insertion. We will recheck in several months. 2. PPM - his medtronic DDD PM is working normally 3. Syncope - he has not had any episodes since his PPM was inserted. 4. HTN - his blood pressure is minimally elevated.   Leonia Reeves.D.

## 2018-07-29 NOTE — Patient Instructions (Signed)
Medication Instructions:  Your physician recommends that you continue on your current medications as directed. Please refer to the Current Medication list given to you today.  If you need a refill on your cardiac medications before your next appointment, please call your pharmacy.   Lab work: NONE  If you have labs (blood work) drawn today and your tests are completely normal, you will receive your results only by: . MyChart Message (if you have MyChart) OR . A paper copy in the mail If you have any lab test that is abnormal or we need to change your treatment, we will call you to review the results.  Testing/Procedures: NONE   Follow-Up: At [facility], you and your health needs are our priority.  As part of our continuing mission to provide you with exceptional heart care, we have created designated Provider Care Teams.  These Care Teams include your primary Cardiologist (physician) and Advanced Practice Providers (APPs -  Physician Assistants and Nurse Practitioners) who all work together to provide you with the care you need, when you need it. You will need a follow up appointment in 1 years.  Please call our office 2 months in advance to schedule this appointment.  You may see None or one of the following Advanced Practice Providers on your designated Care Team:   Amber Seiler, NP . Renee Ursuy, PA-C  Any Other Special Instructions Will Be Listed Below (If Applicable). Thank you for choosing [facility]!     

## 2018-08-03 LAB — CUP PACEART INCLINIC DEVICE CHECK
Implantable Lead Implant Date: 20130730
Implantable Lead Implant Date: 20130730
Implantable Lead Location: 753859
Implantable Lead Model: 5076
Implantable Pulse Generator Implant Date: 20130730
MDC IDC LEAD LOCATION: 753860
MDC IDC SESS DTM: 20200304162125

## 2018-08-04 ENCOUNTER — Ambulatory Visit (INDEPENDENT_AMBULATORY_CARE_PROVIDER_SITE_OTHER): Payer: Medicare Other | Admitting: *Deleted

## 2018-08-04 DIAGNOSIS — I442 Atrioventricular block, complete: Secondary | ICD-10-CM

## 2018-08-05 LAB — CUP PACEART REMOTE DEVICE CHECK
Brady Statistic AP VP Percent: 98 %
Brady Statistic AS VP Percent: 2 %
Brady Statistic AS VS Percent: 0 %
Implantable Lead Implant Date: 20130730
Implantable Lead Implant Date: 20130730
Implantable Lead Location: 753860
Implantable Lead Model: 5092
Lead Channel Impedance Value: 495 Ohm
Lead Channel Impedance Value: 499 Ohm
Lead Channel Pacing Threshold Amplitude: 0.875 V
Lead Channel Pacing Threshold Amplitude: 0.875 V
Lead Channel Pacing Threshold Pulse Width: 0.4 ms
Lead Channel Setting Sensing Sensitivity: 5.6 mV
MDC IDC LEAD LOCATION: 753859
MDC IDC MSMT BATTERY IMPEDANCE: 794 Ohm
MDC IDC MSMT BATTERY REMAINING LONGEVITY: 67 mo
MDC IDC MSMT BATTERY VOLTAGE: 2.78 V
MDC IDC MSMT LEADCHNL RA PACING THRESHOLD PULSEWIDTH: 0.4 ms
MDC IDC PG IMPLANT DT: 20130730
MDC IDC SESS DTM: 20200305130927
MDC IDC SET LEADCHNL RA PACING AMPLITUDE: 2 V
MDC IDC SET LEADCHNL RV PACING AMPLITUDE: 2 V
MDC IDC SET LEADCHNL RV PACING PULSEWIDTH: 0.4 ms
MDC IDC STAT BRADY AP VS PERCENT: 0 %

## 2018-08-15 ENCOUNTER — Encounter: Payer: Self-pay | Admitting: Cardiology

## 2018-08-15 NOTE — Progress Notes (Signed)
Remote pacemaker transmission.   

## 2018-09-09 ENCOUNTER — Other Ambulatory Visit: Payer: Self-pay

## 2018-09-09 ENCOUNTER — Emergency Department (HOSPITAL_COMMUNITY): Payer: Medicare Other

## 2018-09-09 ENCOUNTER — Encounter (HOSPITAL_COMMUNITY): Payer: Self-pay

## 2018-09-09 ENCOUNTER — Emergency Department (HOSPITAL_COMMUNITY)
Admission: EM | Admit: 2018-09-09 | Discharge: 2018-09-09 | Disposition: A | Discharge status: Home / Self Care | Payer: Medicare Other | Attending: Emergency Medicine | Admitting: Emergency Medicine

## 2018-09-09 DIAGNOSIS — Z7982 Long term (current) use of aspirin: Secondary | ICD-10-CM | POA: Diagnosis not present

## 2018-09-09 DIAGNOSIS — I10 Essential (primary) hypertension: Secondary | ICD-10-CM | POA: Insufficient documentation

## 2018-09-09 DIAGNOSIS — E119 Type 2 diabetes mellitus without complications: Secondary | ICD-10-CM | POA: Diagnosis not present

## 2018-09-09 DIAGNOSIS — Y69 Unspecified misadventure during surgical and medical care: Secondary | ICD-10-CM | POA: Diagnosis not present

## 2018-09-09 DIAGNOSIS — I2510 Atherosclerotic heart disease of native coronary artery without angina pectoris: Secondary | ICD-10-CM | POA: Insufficient documentation

## 2018-09-09 DIAGNOSIS — R410 Disorientation, unspecified: Secondary | ICD-10-CM | POA: Insufficient documentation

## 2018-09-09 DIAGNOSIS — Z79899 Other long term (current) drug therapy: Secondary | ICD-10-CM | POA: Insufficient documentation

## 2018-09-09 DIAGNOSIS — T887XXA Unspecified adverse effect of drug or medicament, initial encounter: Secondary | ICD-10-CM | POA: Diagnosis not present

## 2018-09-09 DIAGNOSIS — Z950 Presence of cardiac pacemaker: Secondary | ICD-10-CM | POA: Diagnosis not present

## 2018-09-09 DIAGNOSIS — T50905A Adverse effect of unspecified drugs, medicaments and biological substances, initial encounter: Secondary | ICD-10-CM

## 2018-09-09 DIAGNOSIS — R443 Hallucinations, unspecified: Secondary | ICD-10-CM | POA: Diagnosis present

## 2018-09-09 HISTORY — DX: Other constipation: K59.09

## 2018-09-09 HISTORY — DX: Presence of intraocular lens: Z96.1

## 2018-09-09 LAB — URINALYSIS, ROUTINE W REFLEX MICROSCOPIC
Bilirubin Urine: NEGATIVE
Glucose, UA: >=500 mg/dL — AB
Hgb urine dipstick: NEGATIVE
Ketones, ur: NEGATIVE mg/dL
Nitrite: NEGATIVE
Protein, ur: NEGATIVE mg/dL
Specific Gravity, Urine: 1.015 (ref 1.005–1.030)
WBC, UA: >50 WBC/hpf — ABNORMAL HIGH (ref 0–5)
pH: 5 (ref 5.0–8.0)

## 2018-09-09 LAB — CBC WITH DIFFERENTIAL/PLATELET
Abs Immature Granulocytes: 0.05 10*3/uL (ref 0.00–0.07)
Basophils Absolute: 0 10*3/uL (ref 0.0–0.1)
Basophils Relative: 0 %
Eosinophils Absolute: 0.1 10*3/uL (ref 0.0–0.5)
Eosinophils Relative: 1 %
HCT: 46.9 % (ref 39.0–52.0)
Hemoglobin: 16 g/dL (ref 13.0–17.0)
Immature Granulocytes: 1 %
Lymphocytes Relative: 14 %
Lymphs Abs: 1.3 10*3/uL (ref 0.7–4.0)
MCH: 30.7 pg (ref 26.0–34.0)
MCHC: 34.1 g/dL (ref 30.0–36.0)
MCV: 89.8 fL (ref 80.0–100.0)
Monocytes Absolute: 0.9 10*3/uL (ref 0.1–1.0)
Monocytes Relative: 9 %
Neutro Abs: 6.8 10*3/uL (ref 1.7–7.7)
Neutrophils Relative %: 75 %
Platelets: 177 10*3/uL (ref 150–400)
RBC: 5.22 MIL/uL (ref 4.22–5.81)
RDW: 12.8 % (ref 11.5–15.5)
WBC: 9.1 10*3/uL (ref 4.0–10.5)
nRBC: 0 % (ref 0.0–0.2)

## 2018-09-09 LAB — COMPREHENSIVE METABOLIC PANEL
ALT: 13 U/L (ref 0–44)
AST: 17 U/L (ref 15–41)
Albumin: 3.6 g/dL (ref 3.5–5.0)
Alkaline Phosphatase: 73 U/L (ref 38–126)
Anion gap: 10 (ref 5–15)
BUN: 14 mg/dL (ref 8–23)
CO2: 26 mmol/L (ref 22–32)
Calcium: 10.1 mg/dL (ref 8.9–10.3)
Chloride: 96 mmol/L — ABNORMAL LOW (ref 98–111)
Creatinine, Ser: 0.87 mg/dL (ref 0.61–1.24)
GFR calc Af Amer: >60 mL/min (ref >60)
GFR calc non Af Amer: >60 mL/min (ref >60)
Glucose, Bld: 217 mg/dL — ABNORMAL HIGH (ref 70–99)
Potassium: 3.6 mmol/L (ref 3.5–5.1)
Sodium: 132 mmol/L — ABNORMAL LOW (ref 135–145)
Total Bilirubin: 1.1 mg/dL (ref 0.3–1.2)
Total Protein: 6.4 g/dL — ABNORMAL LOW (ref 6.5–8.1)

## 2018-09-09 LAB — ETHANOL: Alcohol, Ethyl (B): <10 mg/dL (ref <10)

## 2018-09-09 LAB — RAPID URINE DRUG SCREEN, HOSP PERFORMED
Amphetamines: NOT DETECTED
Barbiturates: NOT DETECTED
Benzodiazepines: POSITIVE — AB
Cocaine: NOT DETECTED
Opiates: NOT DETECTED
Tetrahydrocannabinol: NOT DETECTED

## 2018-09-09 LAB — ACETAMINOPHEN LEVEL: Acetaminophen (Tylenol), Serum: <10 ug/mL — ABNORMAL LOW (ref 10–30)

## 2018-09-09 LAB — LACTIC ACID, PLASMA
Lactic Acid, Venous: 2.1 mmol/L (ref 0.5–1.9)
Lactic Acid, Venous: 1.3 mmol/L (ref 0.5–1.9)

## 2018-09-09 LAB — SALICYLATE LEVEL: Salicylate Lvl: <7 mg/dL (ref 2.8–30.0)

## 2018-09-09 LAB — PROTIME-INR
INR: 1 (ref 0.8–1.2)
Prothrombin Time: 13.5 seconds (ref 11.4–15.2)

## 2018-09-09 LAB — TROPONIN I: Troponin I: <0.03 ng/mL (ref <0.03)

## 2018-09-09 MED ORDER — SODIUM CHLORIDE 0.9 % IV BOLUS
500.0000 mL | Freq: Once | INTRAVENOUS | Status: AC
  Administered 2018-09-09: 500 mL via INTRAVENOUS

## 2018-09-09 NOTE — ED Provider Notes (Signed)
[facility] EMERGENCY DEPARTMENT Provider Note   CSN: [id number] Arrival date & time: 09/09/18  1236    History   Chief Complaint Chief Complaint  Patient presents with  . Hallucinations    HPI Guy Roberson is a 85 y.o. male.     HPI  Pt was seen at 1245. Per pt, c/o gradual onset and persistence of two separate episodes of "hearing and seeing things" for the past 2 nights. Pt states he had "cold symptoms" (runny/stuffy nose, sinus congestion, cough) a few days ago, and took OTC benadryl and OTC "cough syrup" (Delsym) last night and the night before. Pt states it was after taking those meds that during both of those nights he experienced AVH. Pt states "it felt like I was in a dream." Pt states that when morning comes, he is "fine."  Pt states also during the daytime (when he does not take either the benadryl or the cough syrup) he "feels fine." Denies symptoms currently.  Denies fevers, sick contacts, COVID+ exposure.  Denies sore throat, no rash, no fevers. Denies CP/palpitations, no SOB/cough, no abd pain, no N/V/D, no back pain, no visual changes, no focal motor weakness, no tingling/numbness in extremities, no ataxia, no slurred speech, no facial droop.        Past Medical History:  Diagnosis Date  . CAD (coronary artery disease)    a.  90% LAD stenosis in 1995 treated with PTCA;  b. 09/2009 CABG x 4: LIMA->LAD, VG->Diag, VG->OM, VG->PDA;  c. 11/2011 Cath: 3VD, 4/4 patent grafts, EF 70%.  . Chronic constipation   . Colonic polyp   . Complete heart block (HCC)    a. 11/2011 s/p MDT Adapta L ADDRL 1 Ser # [id number] H.  . Complication of anesthesia    PROBLEMS VOIDING AFTER PREVIOUS SURGERIES  . Diabetes mellitus without complication (HCC)    NOT ON ANY DIABETIC MEDICATIONS  . Fasting hyperglycemia   . Hyperlipidemia   . Hypertension   . Pacemaker JULY 2013   DR.  G.TAYLOR AND DR. Tommie Sams  . Pseudophakia of both eyes   . Seizures (HCC)    a. prior to diagnosis of heart  block and syncope, this was in the differential and pt was briefly on Keppra, started by ER MD.  . Syncope    a. 11/2011 18 second pauses noted on Event Monitor assoc w/ syncope  . Trauma 1952   Abdominal and chest in 1952; multiple injuries including pelvic fracture, rib fractures and ruptured bladder  . Urinary retention    PT HAS INDWELLING FOLEY CATHETER    Patient Active Problem List   Diagnosis Date Noted  . Urinary retention 01/01/2012    Class: Present on Admission  . Pacemaker-Medtronic 12/30/2011  . Acute respiratory failure (HCC) 12/29/2011  . Rib fracture 12/29/2011  . Hypoxemia 12/29/2011  . Syncope   . Complete heart block (HCC)   . Hypertension   . Hyperlipidemia   . CAD (coronary artery disease)   . Arteriosclerotic cardiovascular disease (ASCVD)   . Fasting hyperglycemia   . Colonic polyp   . DIABETES MELLITUS, TYPE II 10/02/2009  . HYPERLIPIDEMIA 09/30/2009  . Essential hypertension 09/30/2009    Past Surgical History:  Procedure Laterality Date  . BLADDER SURGERY  1950'S  . CATARACT EXTRACTION W/PHACO Right 11/22/2012   Procedure: CATARACT EXTRACTION PHACO AND INTRAOCULAR LENS PLACEMENT (IOC);  Surgeon: Loraine Leriche T. Nile Riggs, MD;  Location: [facility];  Service: Ophthalmology;  Laterality: Right;  CDE 10.27  .  CATARACT EXTRACTION W/PHACO Left 12/20/2012   Procedure: CATARACT EXTRACTION PHACO AND INTRAOCULAR LENS PLACEMENT (IOC);  Surgeon: Loraine Leriche T. Nile Riggs, MD;  Location: [facility];  Service: Ophthalmology;  Laterality: Left;  CDE:6.67  . COLONOSCOPY W/ POLYPECTOMY    . CORONARY ARTERY BYPASS GRAFT  09/2009   4 vessels  . FEMORAL HERNIA REPAIR    . INSERTION OF SUPRAPUBIC CATHETER  04/04/2012   Procedure: INSERTION OF SUPRAPUBIC CATHETER;  Surgeon: Garnett Farm, MD;  Location: [facility];  Service: Urology;  Laterality: N/A;  . LEAD REVISION N/A 12/30/2011   Procedure: LEAD REVISION;  Surgeon: Marinus Maw, MD;  Location: [facility] CATH LAB;  Service: Cardiovascular;  Laterality:  N/A;  . LEFT HEART CATHETERIZATION WITH CORONARY/GRAFT ANGIOGRAM  12/29/2011   Procedure: LEFT HEART CATHETERIZATION WITH Isabel Caprice;  Surgeon: [name], MD;  Location: [facility] CATH LAB;  Service: Cardiovascular;;  . PACEMAKER PLACEMENT    . TEMPORARY PACEMAKER INSERTION N/A 12/29/2011   Procedure: TEMPORARY PACEMAKER INSERTION;  Surgeon: [name], MD;  Location: [facility] CATH LAB;  Service: Cardiovascular;  Laterality: N/A;  . TRANSURETHRAL PROSTATECTOMY WITH GYRUS INSTRUMENTS  04/04/2012   Procedure: TRANSURETHRAL PROSTATECTOMY WITH GYRUS INSTRUMENTS;  Surgeon: Garnett Farm, MD;  Location: [facility];  Service: Urology;  Laterality: N/A;  TURP with Gyrus and Suprapubic Tube Placement  . YAG LASER APPLICATION Left 06/25/2015   Procedure: YAG LASER APPLICATION;  Surgeon: Jethro Bolus, MD;  Location: [facility];  Service: Ophthalmology;  Laterality: Left;        Home Medications    Prior to Admission medications   Medication Sig Start Date End Date Taking? Authorizing Provider  aspirin EC 81 MG tablet Take 81 mg by mouth every morning. 01/01/12  Yes Creig Hines, NP  CONSTULOSE 10 GM/15ML solution Take 30 mLs by mouth daily. 07/27/18  Yes [provider]  Cyanocobalamin (VITAMIN B 12 PO) Take 1 tablet by mouth daily.   Yes [provider]  dextromethorphan (DELSYM) 30 MG/5ML liquid Take 60 mg by mouth 2 (two) times daily as needed for cough.   Yes [provider]  diphenhydrAMINE (BENADRYL) 25 MG tablet Take 25 mg by mouth every 6 (six) hours as needed.   Yes [provider]  hydrochlorothiazide (HYDRODIURIL) 25 MG tablet Take 25 mg by mouth every morning.    Yes [provider]  lisinopril (PRINIVIL,ZESTRIL) 20 MG tablet Take 20 mg by mouth 2 (two) times daily.    Yes [provider]  LORazepam (ATIVAN) 1 MG tablet Take 1 mg by mouth at bedtime.   Yes [provider]  metoprolol tartrate (LOPRESSOR) 50 MG tablet  Take 50 mg by mouth 2 (two) times daily.    Yes [provider]  potassium chloride SA (K-DUR,KLOR-CON) 20 MEQ tablet Take 20 mEq by mouth every morning.    Yes [provider]  simvastatin (ZOCOR) 20 MG tablet Take 20 mg by mouth every morning.    Yes [provider]    Family History Family History  Problem Relation Age of Onset  . Heart block Mother   . Heart murmur Mother   . Aneurysm Father   . Coronary artery disease Brother        x3  . Lung cancer Brother     Social History Social History   Tobacco Use  . Smoking status: Never Smoker  . Smokeless tobacco: Current User    Types: Chew  Substance Use Topics  . Alcohol use: No  .  Drug use: No     Allergies   Penicillins   Review of Systems Review of Systems ROS: Statement: All systems negative except as marked or noted in the HPI; Constitutional: Negative for fever and chills. ; ; Eyes: Negative for eye pain, redness and discharge. ; ; ENMT: Negative for ear pain, hoarseness, sore throat.  +rhinorrhea, nasal congestion, sinus pressure. ; ; Cardiovascular: Negative for chest pain, palpitations, diaphoresis, dyspnea and peripheral edema. ; ; Respiratory: +cough. Negative for wheezing and stridor. ; ; Gastrointestinal: Negative for nausea, vomiting, diarrhea, abdominal pain, blood in stool, hematemesis, jaundice and rectal bleeding. . ; ; Genitourinary: Negative for dysuria, flank pain and hematuria. ; ; Musculoskeletal: Negative for back pain and neck pain. Negative for swelling and trauma.; ; Skin: Negative for pruritus, rash, abrasions, blisters, bruising and skin lesion.; ; Neuro: Negative for headache, lightheadedness and neck stiffness. Negative for weakness, altered level of consciousness, altered mental status, extremity weakness, paresthesias, involuntary movement, seizure and syncope.; Psych:  No SI, no SA, no HI, +AVH at night after he takes benadryl and cough syrup.      Physical Exam  Updated Vital Signs BP (!) 159/79 (BP Location: Left Arm)   Pulse (!) 59   Temp 98 F (36.7 C) (Oral)   Resp 18   Ht  (1.651 m)   Wt 68 kg   SpO2 96%   BMI 24.96 kg/m    BP (!) 135/59   Pulse (!) 59   Temp 98 F (36.7 C) (Oral)   Resp 19   Ht  (1.651 m)   Wt 68 kg   SpO2 98%   BMI 24.96 kg/m    13:06 Orthostatic Vital Signs LA  Orthostatic Lying   BP- Lying: 159/79  Pulse- Lying: 62      Orthostatic Sitting  BP- Sitting: 165/67  Pulse- Sitting: 60      Orthostatic Standing at 0 minutes  BP- Standing at 0 minutes: 153/49  Pulse- Standing at 0 minutes: 67      Physical Exam 1320: Physical examination:  Nursing notes reviewed; Vital signs and O2 SAT reviewed;  Constitutional: Well developed, Well nourished, Well hydrated, In no acute distress; Head:  Normocephalic, atraumatic; Eyes: EOMI, PERRL, No scleral icterus; ENMT: Mouth and pharynx normal, Mucous membranes moist; Neck: Supple, Full range of motion, No lymphadenopathy; Cardiovascular: Regular rate and rhythm, No gallop; Respiratory: Breath sounds clear & equal bilaterally, No wheezes.  Speaking full sentences with ease, Normal respiratory effort/excursion; Chest: Nontender, Movement normal; Abdomen: Soft, Nontender, Nondistended, Normal bowel sounds; Genitourinary: No CVA tenderness; Extremities: Peripheral pulses normal, No tenderness, No edema, No calf edema or asymmetry.; Neuro: AA&Ox3, Major CN grossly intact. Speech clear.  No facial droop.  No nystagmus. Grips equal. Strength 5/5 equal bilat UE's and LE's.  DTR 2/4 equal bilat UE's and LE's.  No gross sensory deficits.  Normal cerebellar testing bilat UE's (finger-nose) and LE's (heel-shin). Climbs on and off stretcher easily by himself. Gait steady..; Skin: Color normal, Warm, Dry.; Psych:  Full affect, no SI/HI/AVH.    ED Treatments / Results  Labs (all labs ordered are listed, but only abnormal results are displayed)   EKG EKG Interpretation   Date/Time:  Friday September 09 2018 13:00:08 EDT Ventricular Rate:  73 PR Interval:    QRS Duration: 174 QT Interval:  445 QTC Calculation: 491 R Axis:   -76 Text Interpretation:  Atrial-paced complexes Left bundle branch block Baseline wander When compared with ECG of  11/17/2012 No significant change was found Confirmed by Samuel Jester [phone]) on 09/09/2018 1:32:31 PM   Radiology   Procedures Procedures (including critical care time)  Medications Ordered in ED Medications - No data to display   Initial Impression / Assessment and Plan / ED Course  I have reviewed the triage vital signs and the nursing notes.  Pertinent labs & imaging results that were available during my care of the patient were reviewed by me and considered in my medical decision making (see chart for details).     MDM Reviewed: previous chart, nursing note and vitals Reviewed previous: labs and ECG Interpretation: labs, ECG, x-ray and CT scan    Results for orders placed or performed during the hospital encounter of 09/09/18  Comprehensive metabolic panel  Result Value Ref Range   Sodium 132 (L) 135 - 145 mmol/L   Potassium 3.6 3.5 - 5.1 mmol/L   Chloride 96 (L) 98 - 111 mmol/L   CO2 26 22 - 32 mmol/L   Glucose, Bld 217 (H) 70 - 99 mg/dL   BUN 14 8 - 23 mg/dL   Creatinine, Ser 6.04 0.61 - 1.24 mg/dL   Calcium 54.0 8.9 - 98.1 mg/dL   Total Protein 6.4 (L) 6.5 - 8.1 g/dL   Albumin 3.6 3.5 - 5.0 g/dL   AST 17 15 - 41 U/L   ALT 13 0 - 44 U/L   Alkaline Phosphatase 73 38 - 126 U/L   Total Bilirubin 1.1 0.3 - 1.2 mg/dL   GFR calc non Af Amer >60 >60 mL/min   GFR calc Af Amer >60 >60 mL/min   Anion gap 10 5 - 15  Acetaminophen level  Result Value Ref Range   Acetaminophen (Tylenol), Serum <10 (L) 10 - 30 ug/mL  Ethanol  Result Value Ref Range   Alcohol, Ethyl (B) <10 <10 mg/dL  Troponin I - Once  Result Value Ref Range   Troponin I <0.03 <0.03 ng/mL  Lactic acid, plasma  Result Value Ref  Range   Lactic Acid, Venous 2.1 (HH) 0.5 - 1.9 mmol/L  Lactic acid, plasma  Result Value Ref Range   Lactic Acid, Venous 1.3 0.5 - 1.9 mmol/L  Salicylate level  Result Value Ref Range   Salicylate Lvl <7.0 2.8 - 30.0 mg/dL  Protime-INR  Result Value Ref Range   Prothrombin Time 13.5 11.4 - 15.2 seconds   INR 1.0 0.8 - 1.2  CBC with Differential  Result Value Ref Range   WBC 9.1 4.0 - 10.5 K/uL   RBC 5.22 4.22 - 5.81 MIL/uL   Hemoglobin 16.0 13.0 - 17.0 g/dL   HCT 19.1 47.8 - 29.5 %   MCV 89.8 80.0 - 100.0 fL   MCH 30.7 26.0 - 34.0 pg   MCHC 34.1 30.0 - 36.0 g/dL   RDW 62.1 30.8 - 65.7 %   Platelets 177 150 - 400 K/uL   nRBC 0.0 0.0 - 0.2 %   Neutrophils Relative % 75 %   Neutro Abs 6.8 1.7 - 7.7 K/uL   Lymphocytes Relative 14 %   Lymphs Abs 1.3 0.7 - 4.0 K/uL   Monocytes Relative 9 %   Monocytes Absolute 0.9 0.1 - 1.0 K/uL   Eosinophils Relative 1 %   Eosinophils Absolute 0.1 0.0 - 0.5 K/uL   Basophils Relative 0 %   Basophils Absolute 0.0 0.0 - 0.1 K/uL   Immature Granulocytes 1 %   Abs Immature Granulocytes 0.05 0.00 - 0.07 K/uL  Urine  rapid drug screen (hosp performed)  Result Value Ref Range   Opiates NONE DETECTED NONE DETECTED   Cocaine NONE DETECTED NONE DETECTED   Benzodiazepines POSITIVE (A) NONE DETECTED   Amphetamines NONE DETECTED NONE DETECTED   Tetrahydrocannabinol NONE DETECTED NONE DETECTED   Barbiturates NONE DETECTED NONE DETECTED  Urinalysis, Routine w reflex microscopic  Result Value Ref Range   Color, Urine YELLOW YELLOW   APPearance HAZY (A) CLEAR   Specific Gravity, Urine 1.015 1.005 - 1.030   pH 5.0 5.0 - 8.0   Glucose, UA >=500 (A) NEGATIVE mg/dL   Hgb urine dipstick NEGATIVE NEGATIVE   Bilirubin Urine NEGATIVE NEGATIVE   Ketones, ur NEGATIVE NEGATIVE mg/dL   Protein, ur NEGATIVE NEGATIVE mg/dL   Nitrite NEGATIVE NEGATIVE   Leukocytes,Ua SMALL (A) NEGATIVE   RBC / HPF 0-5 0 - 5 RBC/hpf   WBC, UA >50 (H) 0 - 5 WBC/hpf   Bacteria, UA  RARE (A) NONE SEEN   Squamous Epithelial / LPF 0-5 0 - 5   Mucus PRESENT    Dg Chest 2 View Result Date: 09/09/2018 CLINICAL DATA:  Altered mental status EXAM: CHEST - 2 VIEW COMPARISON:  11/20/2015 FINDINGS: Prior CABG. Left pacer remains in place, unchanged. Heart and mediastinal contours are within normal limits. No focal opacities or effusions. No acute bony abnormality. IMPRESSION: No active cardiopulmonary disease. Electronically Signed   By: Charlett NoseKevin  Dover M.D.   On: 09/09/2018 14:14   Ct Head Wo Contrast Result Date: 09/09/2018 CLINICAL DATA:  Hallucinations. EXAM: CT HEAD WITHOUT CONTRAST TECHNIQUE: Contiguous axial images were obtained from the base of the skull through the vertex without intravenous contrast. COMPARISON:  CT scan of December 16, 2011. FINDINGS: Brain: Mild diffuse cortical atrophy is noted. Mild chronic ischemic white matter disease is noted. No mass effect or midline shift is noted. Ventricular size is within normal limits. There is no evidence of mass lesion, hemorrhage or acute infarction. Vascular: No hyperdense vessel or unexpected calcification. Skull: Normal. Negative for fracture or focal lesion. Sinuses/Orbits: No acute finding. Other: None. IMPRESSION: Mild chronic ischemic white matter disease. Mild diffuse cortical atrophy. No acute intracranial abnormality seen. Electronically Signed   By: Lupita RaiderJames  Green Jr, M.D.   On: 09/09/2018 13:42    1540:  Judicious IVF given for mildly elevated lactic acid and CBG with improvement. No clear UTI on Udip and pt denies any urinary symptoms or fevers; UC is pending.  Pt not orthostatic on VS. Neuro exam remains intact/unchanged. Pt states he "feels better" and "I thought it probably was the medicines." States he would like to go home now.  Symptoms only occur shortly after he takes benadryl + dextromethorphan (+ his usual ativan) at bedtime for the past 2 nights. Pt has been without any symptoms during the day. Doubt TIA/CVA or  infection as cause for AVH symptoms. No clear indication for admission at this time.  Dx and testing d/w pt.  Questions answered.  Verb understanding, agreeable to d/c home with outpt f/u.      Final Clinical Impressions(s) / ED Diagnoses   Final diagnoses:  None    ED Discharge Orders    None       Samuel JesterMcManus, Cai Flott, DO 09/12/18 1431

## 2018-09-09 NOTE — Discharge Instructions (Addendum)
Take your usual prescriptions as previously directed. Stop taking the cough syrup and benadryl.  Call your regular medical doctor today to schedule a follow up appointment within the next 3 days.  Return to the Emergency Department immediately sooner if worsening.

## 2018-09-09 NOTE — ED Triage Notes (Addendum)
Pt reports he hears and see things at night for the past 2-3 days. Pt reports cold symptoms and he has been taking cough med and benadryl since this started

## 2018-09-09 NOTE — ED Notes (Addendum)
Date and time results received: 09/09/18 2:02 PM  Test: lactic acid Critical Value: 2.1  Name of Provider Notified: Clarene Duke, MD  Orders Received? Or Actions Taken?: None at this time.

## 2018-09-11 LAB — URINE CULTURE

## 2018-11-03 ENCOUNTER — Ambulatory Visit (INDEPENDENT_AMBULATORY_CARE_PROVIDER_SITE_OTHER): Payer: Medicare Other | Admitting: *Deleted

## 2018-11-03 DIAGNOSIS — I442 Atrioventricular block, complete: Secondary | ICD-10-CM

## 2018-11-04 LAB — CUP PACEART REMOTE DEVICE CHECK
Battery Impedance: 923 Ohm
Battery Remaining Longevity: 62 mo
Battery Voltage: 2.78 V
Brady Statistic AP VP Percent: 99 %
Brady Statistic AP VS Percent: 0 %
Brady Statistic AS VP Percent: 1 %
Brady Statistic AS VS Percent: 0 %
Date Time Interrogation Session: 20200604132131
Implantable Lead Implant Date: 20130730
Implantable Lead Implant Date: 20130730
Implantable Lead Location: 753859
Implantable Lead Location: 753860
Implantable Lead Model: 5076
Implantable Lead Model: 5092
Implantable Pulse Generator Implant Date: 20130730
Lead Channel Impedance Value: 525 Ohm
Lead Channel Impedance Value: 542 Ohm
Lead Channel Pacing Threshold Amplitude: 0.875 V
Lead Channel Pacing Threshold Amplitude: 1 V
Lead Channel Pacing Threshold Pulse Width: 0.4 ms
Lead Channel Pacing Threshold Pulse Width: 0.4 ms
Lead Channel Setting Pacing Amplitude: 2 V
Lead Channel Setting Pacing Amplitude: 2 V
Lead Channel Setting Pacing Pulse Width: 0.4 ms
Lead Channel Setting Sensing Sensitivity: 5.6 mV

## 2018-11-10 NOTE — Progress Notes (Signed)
Remote pacemaker transmission.   

## 2019-02-02 ENCOUNTER — Ambulatory Visit (INDEPENDENT_AMBULATORY_CARE_PROVIDER_SITE_OTHER): Payer: Medicare Other | Admitting: *Deleted

## 2019-02-02 DIAGNOSIS — I442 Atrioventricular block, complete: Secondary | ICD-10-CM

## 2019-02-02 LAB — CUP PACEART REMOTE DEVICE CHECK
Battery Impedance: 1000 Ohm
Battery Remaining Longevity: 59 mo
Battery Voltage: 2.78 V
Brady Statistic AP VP Percent: 99 %
Brady Statistic AP VS Percent: 0 %
Brady Statistic AS VP Percent: 1 %
Brady Statistic AS VS Percent: 0 %
Date Time Interrogation Session: 20200903131313
Implantable Lead Implant Date: 20130730
Implantable Lead Implant Date: 20130730
Implantable Lead Location: 753859
Implantable Lead Location: 753860
Implantable Lead Model: 5076
Implantable Lead Model: 5092
Implantable Pulse Generator Implant Date: 20130730
Lead Channel Impedance Value: 525 Ohm
Lead Channel Impedance Value: 539 Ohm
Lead Channel Pacing Threshold Amplitude: 0.875 V
Lead Channel Pacing Threshold Amplitude: 1 V
Lead Channel Pacing Threshold Pulse Width: 0.4 ms
Lead Channel Pacing Threshold Pulse Width: 0.4 ms
Lead Channel Setting Pacing Amplitude: 2 V
Lead Channel Setting Pacing Amplitude: 2 V
Lead Channel Setting Pacing Pulse Width: 0.4 ms
Lead Channel Setting Sensing Sensitivity: 5.6 mV

## 2019-02-16 ENCOUNTER — Encounter: Payer: Self-pay | Admitting: Cardiology

## 2019-02-16 NOTE — Progress Notes (Signed)
Remote pacemaker transmission.   

## 2019-05-04 ENCOUNTER — Ambulatory Visit (INDEPENDENT_AMBULATORY_CARE_PROVIDER_SITE_OTHER): Payer: Medicare Other | Admitting: *Deleted

## 2019-05-04 DIAGNOSIS — I442 Atrioventricular block, complete: Secondary | ICD-10-CM

## 2019-05-04 LAB — CUP PACEART REMOTE DEVICE CHECK
Battery Impedance: 1130 Ohm
Battery Remaining Longevity: 56 mo
Battery Voltage: 2.78 V
Brady Statistic AP VP Percent: 99 %
Brady Statistic AP VS Percent: 0 %
Brady Statistic AS VP Percent: 1 %
Brady Statistic AS VS Percent: 0 %
Date Time Interrogation Session: 20201203090833
Implantable Lead Implant Date: 20130730
Implantable Lead Implant Date: 20130730
Implantable Lead Location: 753859
Implantable Lead Location: 753860
Implantable Lead Model: 5076
Implantable Lead Model: 5092
Implantable Pulse Generator Implant Date: 20130730
Lead Channel Impedance Value: 542 Ohm
Lead Channel Impedance Value: 561 Ohm
Lead Channel Pacing Threshold Amplitude: 0.75 V
Lead Channel Pacing Threshold Amplitude: 1 V
Lead Channel Pacing Threshold Pulse Width: 0.4 ms
Lead Channel Pacing Threshold Pulse Width: 0.4 ms
Lead Channel Setting Pacing Amplitude: 1.875
Lead Channel Setting Pacing Amplitude: 2 V
Lead Channel Setting Pacing Pulse Width: 0.4 ms
Lead Channel Setting Sensing Sensitivity: 5.6 mV

## 2019-05-30 NOTE — Progress Notes (Signed)
PPM remote 

## 2019-06-13 ENCOUNTER — Encounter (HOSPITAL_COMMUNITY): Payer: Self-pay | Admitting: Emergency Medicine

## 2019-06-13 ENCOUNTER — Emergency Department (HOSPITAL_COMMUNITY)
Admission: EM | Admit: 2019-06-13 | Discharge: 2019-06-13 | Disposition: A | Discharge status: Home / Self Care | Payer: Medicare PPO | Attending: Emergency Medicine | Admitting: Emergency Medicine

## 2019-06-13 ENCOUNTER — Emergency Department (HOSPITAL_COMMUNITY): Payer: Medicare PPO

## 2019-06-13 ENCOUNTER — Other Ambulatory Visit: Payer: Self-pay

## 2019-06-13 DIAGNOSIS — K209 Esophagitis, unspecified without bleeding: Secondary | ICD-10-CM | POA: Diagnosis not present

## 2019-06-13 DIAGNOSIS — Z79899 Other long term (current) drug therapy: Secondary | ICD-10-CM | POA: Diagnosis not present

## 2019-06-13 DIAGNOSIS — R109 Unspecified abdominal pain: Secondary | ICD-10-CM

## 2019-06-13 DIAGNOSIS — Z950 Presence of cardiac pacemaker: Secondary | ICD-10-CM | POA: Diagnosis not present

## 2019-06-13 DIAGNOSIS — R1010 Upper abdominal pain, unspecified: Secondary | ICD-10-CM | POA: Diagnosis present

## 2019-06-13 DIAGNOSIS — Z7982 Long term (current) use of aspirin: Secondary | ICD-10-CM | POA: Diagnosis not present

## 2019-06-13 DIAGNOSIS — E1165 Type 2 diabetes mellitus with hyperglycemia: Secondary | ICD-10-CM | POA: Insufficient documentation

## 2019-06-13 DIAGNOSIS — I2510 Atherosclerotic heart disease of native coronary artery without angina pectoris: Secondary | ICD-10-CM | POA: Insufficient documentation

## 2019-06-13 DIAGNOSIS — I10 Essential (primary) hypertension: Secondary | ICD-10-CM | POA: Insufficient documentation

## 2019-06-13 DIAGNOSIS — R1310 Dysphagia, unspecified: Secondary | ICD-10-CM

## 2019-06-13 DIAGNOSIS — R739 Hyperglycemia, unspecified: Secondary | ICD-10-CM

## 2019-06-13 DIAGNOSIS — Z951 Presence of aortocoronary bypass graft: Secondary | ICD-10-CM | POA: Diagnosis not present

## 2019-06-13 LAB — CBC
HCT: 54.7 % — ABNORMAL HIGH (ref 39.0–52.0)
Hemoglobin: 18 g/dL — ABNORMAL HIGH (ref 13.0–17.0)
MCH: 30.2 pg (ref 26.0–34.0)
MCHC: 32.9 g/dL (ref 30.0–36.0)
MCV: 91.6 fL (ref 80.0–100.0)
Platelets: 189 10*3/uL (ref 150–400)
RBC: 5.97 MIL/uL — ABNORMAL HIGH (ref 4.22–5.81)
RDW: 12.9 % (ref 11.5–15.5)
WBC: 14.7 10*3/uL — ABNORMAL HIGH (ref 4.0–10.5)
nRBC: 0 % (ref 0.0–0.2)

## 2019-06-13 LAB — URINALYSIS, ROUTINE W REFLEX MICROSCOPIC
Bacteria, UA: NONE SEEN
Bilirubin Urine: NEGATIVE
Glucose, UA: >=500 mg/dL — AB
Hgb urine dipstick: NEGATIVE
Ketones, ur: NEGATIVE mg/dL
Leukocytes,Ua: NEGATIVE
Nitrite: NEGATIVE
Protein, ur: 100 mg/dL — AB
Specific Gravity, Urine: 1.022 (ref 1.005–1.030)
pH: 6 (ref 5.0–8.0)

## 2019-06-13 LAB — COMPREHENSIVE METABOLIC PANEL
ALT: 15 U/L (ref 0–44)
AST: 16 U/L (ref 15–41)
Albumin: 4.4 g/dL (ref 3.5–5.0)
Alkaline Phosphatase: 70 U/L (ref 38–126)
Anion gap: 10 (ref 5–15)
BUN: 23 mg/dL (ref 8–23)
CO2: 32 mmol/L (ref 22–32)
Calcium: 11.7 mg/dL — ABNORMAL HIGH (ref 8.9–10.3)
Chloride: 91 mmol/L — ABNORMAL LOW (ref 98–111)
Creatinine, Ser: 1.08 mg/dL (ref 0.61–1.24)
GFR calc Af Amer: >60 mL/min (ref >60)
GFR calc non Af Amer: >60 mL/min (ref >60)
Glucose, Bld: 252 mg/dL — ABNORMAL HIGH (ref 70–99)
Potassium: 4.2 mmol/L (ref 3.5–5.1)
Sodium: 133 mmol/L — ABNORMAL LOW (ref 135–145)
Total Bilirubin: 1 mg/dL (ref 0.3–1.2)
Total Protein: 7.4 g/dL (ref 6.5–8.1)

## 2019-06-13 LAB — LIPASE, BLOOD: Lipase: 37 U/L (ref 11–51)

## 2019-06-13 MED ORDER — IOHEXOL 300 MG/ML  SOLN
100.0000 mL | Freq: Once | INTRAMUSCULAR | Status: AC | PRN
  Administered 2019-06-13: 100 mL via INTRAVENOUS

## 2019-06-13 MED ORDER — PANTOPRAZOLE SODIUM 20 MG PO TBEC: 20.0000 mg | DELAYED_RELEASE_TABLET | Freq: Two times a day (BID) | ORAL | 0 refills | Status: DC

## 2019-06-13 MED ORDER — PANTOPRAZOLE SODIUM 40 MG IV SOLR
40.0000 mg | Freq: Once | INTRAVENOUS | Status: AC
  Administered 2019-06-13: 13:00:00 40 mg via INTRAVENOUS
  Filled 2019-06-13: qty 40

## 2019-06-13 MED ORDER — SODIUM CHLORIDE 0.9 % IV BOLUS
500.0000 mL | Freq: Once | INTRAVENOUS | Status: AC
  Administered 2019-06-13: 500 mL via INTRAVENOUS

## 2019-06-13 MED ORDER — METOCLOPRAMIDE HCL 5 MG/ML IJ SOLN
10.0000 mg | Freq: Once | INTRAMUSCULAR | Status: AC
  Administered 2019-06-13: 10 mg via INTRAVENOUS
  Filled 2019-06-13: qty 2

## 2019-06-13 NOTE — ED Notes (Signed)
Pt was informed that we need a urine sample. Pt states that he can not urinate at this time. 

## 2019-06-13 NOTE — ED Triage Notes (Signed)
Pt reports food "gets hung" while eating x1 year. Pt reports last night intermittent indigestion, hiccups, shortness of breath. Pt reports emesis x1 this am. Pt denies any pain at this time.

## 2019-06-13 NOTE — Discharge Instructions (Addendum)
Your sugar is slightly elevated in the mid 200s.  CT scan shows irritation of your esophagus (which is her swallowing tube) in your stomach.  Prescription for medication to help this condition.  Strongly recommend follow-up with gastroenterologist such as Dr. Karilyn Cota.  You may need a scoping procedure to further look at this tissue in your esophagus and stomach.

## 2019-06-13 NOTE — ED Provider Notes (Signed)
[facility] EMERGENCY DEPARTMENT Provider Note   CSN: [id number] Arrival date & time: 06/13/19  1540     History Chief Complaint  Patient presents with  . Abdominal Pain    Guy Roberson is a 86 y.o. male.  Chief complaint upper abdominal pain for lengthy period of time with associated sensation of food "getting hung" in his lower esophagus.  Review of systems positive for a sensation of indigestion, hiccups.  Vomited x1 this morning.  Past surgical history includes "bladder surgery", CABG, femoral hernia repair, pacemaker, TURP.  Many chronic medical problems.  No fever, sweats, chills, dysuria, hematuria, cough, known Covid exposure.        Past Medical History:  Diagnosis Date  . CAD (coronary artery disease)    a.  90% LAD stenosis in 1995 treated with PTCA;  b. 09/2009 CABG x 4: LIMA->LAD, VG->Diag, VG->OM, VG->PDA;  c. 11/2011 Cath: 3VD, 4/4 patent grafts, EF 70%.  . Chronic constipation   . Colonic polyp   . Complete heart block (HCC)    a. 11/2011 s/p MDT Adapta L ADDRL 1 Ser # [id number] H.  . Complication of anesthesia    PROBLEMS VOIDING AFTER PREVIOUS SURGERIES  . Diabetes mellitus without complication (HCC)    NOT ON ANY DIABETIC MEDICATIONS  . Fasting hyperglycemia   . Hyperlipidemia   . Hypertension   . Pacemaker JULY 2013   DR.  G.TAYLOR AND DR. Tommie Sams  . Pseudophakia of both eyes   . Seizures (HCC)    a. prior to diagnosis of heart block and syncope, this was in the differential and pt was briefly on Keppra, started by ER MD.  . Syncope    a. 11/2011 18 second pauses noted on Event Monitor assoc w/ syncope  . Trauma 1952   Abdominal and chest in 1952; multiple injuries including pelvic fracture, rib fractures and ruptured bladder  . Urinary retention    PT HAS INDWELLING FOLEY CATHETER    Patient Active Problem List   Diagnosis Date Noted  . Urinary retention 01/01/2012    Class: Present on Admission  . Pacemaker-Medtronic 12/30/2011  . Acute  respiratory failure (HCC) 12/29/2011  . Rib fracture 12/29/2011  . Hypoxemia 12/29/2011  . Syncope   . Complete heart block (HCC)   . Hypertension   . Hyperlipidemia   . CAD (coronary artery disease)   . Arteriosclerotic cardiovascular disease (ASCVD)   . Fasting hyperglycemia   . Colonic polyp   . DIABETES MELLITUS, TYPE II 10/02/2009  . HYPERLIPIDEMIA 09/30/2009  . Essential hypertension 09/30/2009    Past Surgical History:  Procedure Laterality Date  . BLADDER SURGERY  1950'S  . CATARACT EXTRACTION W/PHACO Right 11/22/2012   Procedure: CATARACT EXTRACTION PHACO AND INTRAOCULAR LENS PLACEMENT (IOC);  Surgeon: Loraine Leriche T. Nile Riggs, MD;  Location: [facility];  Service: Ophthalmology;  Laterality: Right;  CDE 10.27  . CATARACT EXTRACTION W/PHACO Left 12/20/2012   Procedure: CATARACT EXTRACTION PHACO AND INTRAOCULAR LENS PLACEMENT (IOC);  Surgeon: Loraine Leriche T. Nile Riggs, MD;  Location: [facility];  Service: Ophthalmology;  Laterality: Left;  CDE:6.67  . COLONOSCOPY W/ POLYPECTOMY    . CORONARY ARTERY BYPASS GRAFT  09/2009   4 vessels  . FEMORAL HERNIA REPAIR    . INSERTION OF SUPRAPUBIC CATHETER  04/04/2012   Procedure: INSERTION OF SUPRAPUBIC CATHETER;  Surgeon: Garnett Farm, MD;  Location: [facility];  Service: Urology;  Laterality: N/A;  . LEAD REVISION N/A 12/30/2011   Procedure: LEAD REVISION;  Surgeon: Sharlot Gowda  Myna Hidalgo, MD;  Location: [facility] CATH LAB;  Service: Cardiovascular;  Laterality: N/A;  . LEFT HEART CATHETERIZATION WITH CORONARY/GRAFT ANGIOGRAM  12/29/2011   Procedure: LEFT HEART CATHETERIZATION WITH Isabel Caprice;  Surgeon: [name], MD;  Location: [facility] C[facility] LAB;  Service: Cardiovascular;;  . PACEMAKER PLACEMENT    . TEMPORARY PACEMAKER INSERTION N/A 12/29/2011   Procedure: TEMPORARY PACEMAKER INSERTION;  Surgeon: [name], MD;  Location: [facility] CATH LAB;  Service: Cardiovascular;  Laterality: N/A;  . TRANSURETHRAL PROSTATECTOMY WITH GYRUS INSTRUMENTS  04/04/2012   Procedure:  TRANSURETHRAL PROSTATECTOMY WITH GYRUS INSTRUMENTS;  Surgeon: Garnett Farm, MD;  Location: [facility];  Service: Urology;  Laterality: N/A;  TURP with Gyrus and Suprapubic Tube Placement  . YAG LASER APPLICATION Left 06/25/2015   Procedure: YAG LASER APPLICATION;  Surgeon: Jethro Bolus, MD;  Location: [facility];  Service: Ophthalmology;  Laterality: Left;       Family History  Problem Relation Age of Onset  . Heart block Mother   . Heart murmur Mother   . Aneurysm Father   . Coronary artery disease Brother        x3  . Lung cancer Brother     Social History   Tobacco Use  . Smoking status: Never Smoker  . Smokeless tobacco: Current User    Types: Chew  Substance Use Topics  . Alcohol use: No  . Drug use: No    Home Medications Prior to Admission medications   Medication Sig Start Date End Date Taking? Authorizing Provider  aspirin EC 81 MG tablet Take 81 mg by mouth every morning. 01/01/12  Yes Creig Hines, NP  CONSTULOSE 10 GM/15ML solution Take 30 mLs by mouth daily. 07/27/18  Yes [provider]  Cyanocobalamin (VITAMIN B 12 PO) Take 1 tablet by mouth daily.   Yes [provider]  dextromethorphan (DELSYM) 30 MG/5ML liquid Take 60 mg by mouth 2 (two) times daily as needed for cough.   Yes [provider]  diclofenac (CATAFLAM) 50 MG tablet Take 50 mg by mouth 3 (three) times daily. 05/17/19  Yes [provider]  diphenhydrAMINE (BENADRYL) 25 MG tablet Take 25 mg by mouth every 6 (six) hours as needed.   Yes [provider]  hydrochlorothiazide (HYDRODIURIL) 25 MG tablet Take 25 mg by mouth every morning.    Yes [provider]  lisinopril (PRINIVIL,ZESTRIL) 20 MG tablet Take 20 mg by mouth 2 (two) times daily.    Yes [provider]  LORazepam (ATIVAN) 1 MG tablet Take 1 mg by mouth at bedtime.   Yes [provider]  metoprolol tartrate (LOPRESSOR) 50 MG tablet Take 50 mg by mouth 2 (two) times  daily.    Yes [provider]  potassium chloride SA (K-DUR,KLOR-CON) 20 MEQ tablet Take 20 mEq by mouth every morning.    Yes [provider]  simvastatin (ZOCOR) 20 MG tablet Take 20 mg by mouth every morning.    Yes [provider]    Allergies    Penicillins  Review of Systems   Review of Systems  All other systems reviewed and are negative.   Physical Exam Updated Vital Signs BP (!) 171/114 (BP Location: Right Arm)   Pulse 77   Temp 97.8 F (36.6 C) (Oral)   Resp 12   Ht 5\' 5"  (1.651 m)   Wt 70.3 kg   SpO2 97%   BMI 25.79 kg/m   Physical Exam Vitals and nursing note reviewed.  Constitutional:  Appearance: He is well-developed.     Comments: nad  HENT:     Head: Normocephalic and atraumatic.  Eyes:     Conjunctiva/sclera: Conjunctivae normal.  Cardiovascular:     Rate and Rhythm: Normal rate and regular rhythm.  Pulmonary:     Effort: Pulmonary effort is normal.     Breath sounds: Normal breath sounds.  Abdominal:     General: Bowel sounds are normal.     Palpations: Abdomen is soft.     Comments: Protuberant abdomen; minimal upper abdominal tenderness  Musculoskeletal:        General: Normal range of motion.     Cervical back: Neck supple.  Skin:    General: Skin is warm and dry.  Neurological:     General: No focal deficit present.     Mental Status: He is alert and oriented to person, place, and time.  Psychiatric:        Behavior: Behavior normal.     ED Results / Procedures / Treatments   Labs (all labs ordered are listed, but only abnormal results are displayed) Labs Reviewed  COMPREHENSIVE METABOLIC PANEL - Abnormal; Notable for the following components:      Result Value   Sodium 133 (*)    Chloride 91 (*)    Glucose, Bld 252 (*)    Calcium 11.7 (*)    All other components within normal limits  CBC - Abnormal; Notable for the following components:   WBC 14.7 (*)    RBC 5.97 (*)    Hemoglobin 18.0 (*)     HCT 54.7 (*)    All other components within normal limits  URINALYSIS, ROUTINE W REFLEX MICROSCOPIC - Abnormal; Notable for the following components:   Glucose, UA >=500 (*)    Protein, ur 100 (*)    All other components within normal limits  LIPASE, BLOOD    EKG EKG Interpretation  Date/Time:  Tuesday June 13 2019 09:59:47 EST Ventricular Rate:  69 PR Interval:  194 QRS Duration: 166 QT Interval:  436 QTC Calculation: 467 R Axis:   -110 Text Interpretation: AV dual-paced rhythm with occasional atrial-paced complexes and with occasional Premature ventricular complexes Abnormal ECG Confirmed by Nat Christen [phone]) on 06/13/2019 10:04:39 AM   Radiology No results found.  Procedures Procedures (including critical care time)  Medications Ordered in ED Medications  metoCLOPramide (REGLAN) injection 10 mg (10 mg Intravenous Given 06/13/19 1249)  pantoprazole (PROTONIX) injection 40 mg (40 mg Intravenous Given 06/13/19 1249)  sodium chloride 0.9 % bolus 500 mL (500 mLs Intravenous New Bag/Given 06/13/19 1250)    ED Course  I have reviewed the triage vital signs and the nursing notes.  Pertinent labs & imaging results that were available during my care of the patient were reviewed by me and considered in my medical decision making (see chart for details).    MDM Rules/Calculators/A&P                      Uncertain etiology of patient's abdominal pain and dysphagia.  White count 14.7.  Glucose 252.  Liver functions normal.  CT abdomen pelvis pending. Final Clinical Impression(s) / ED Diagnoses Final diagnoses:  Abdominal pain, unspecified abdominal location  Hyperglycemia  Dysphagia, unspecified type    Rx / DC Orders ED Discharge Orders    None       Nat Christen, MD 06/13/19 1433

## 2019-06-13 NOTE — ED Notes (Signed)
RN called to room by pt son. Pt c/o mid upper epigastric pain and nausea. States it is making him feel short of breath. VSS. RN reassured patient and discussed treatment plan. nad noted.

## 2019-06-26 ENCOUNTER — Other Ambulatory Visit: Payer: Self-pay

## 2019-06-26 ENCOUNTER — Encounter (INDEPENDENT_AMBULATORY_CARE_PROVIDER_SITE_OTHER): Payer: Self-pay | Admitting: Gastroenterology

## 2019-06-26 ENCOUNTER — Ambulatory Visit (INDEPENDENT_AMBULATORY_CARE_PROVIDER_SITE_OTHER): Payer: 59 | Admitting: Gastroenterology

## 2019-06-26 VITALS — BP 148/78 | HR 78 | Temp 98.0°F | Ht 67.0 in | Wt 154.6 lb

## 2019-06-26 DIAGNOSIS — R933 Abnormal findings on diagnostic imaging of other parts of digestive tract: Secondary | ICD-10-CM | POA: Diagnosis not present

## 2019-06-26 DIAGNOSIS — E871 Hypo-osmolality and hyponatremia: Secondary | ICD-10-CM | POA: Diagnosis not present

## 2019-06-26 DIAGNOSIS — K219 Gastro-esophageal reflux disease without esophagitis: Secondary | ICD-10-CM

## 2019-06-26 DIAGNOSIS — D72829 Elevated white blood cell count, unspecified: Secondary | ICD-10-CM | POA: Diagnosis not present

## 2019-06-26 MED ORDER — PANTOPRAZOLE SODIUM 20 MG PO TBEC: 20.0000 mg | DELAYED_RELEASE_TABLET | Freq: Two times a day (BID) | ORAL | 3 refills | Status: DC

## 2019-06-26 NOTE — Patient Instructions (Signed)
We are recommending an upper endoscopy to evaluate your esophagus and follow-up on the abnormality seen on the CT scan.  We will contact you when we are able to schedule elective procedures with the Covid restrictions.  In the interim I sent medication for Protonix 20 mg twice a day to pharmacy.  Please contact me if you have any questions.

## 2019-06-26 NOTE — Progress Notes (Signed)
Patient profile: Guy Roberson is a 86 y.o. male seen for evaluation of abnormal CT .   History of Present Illness: Guy Roberson is seen today for for ER follow-up.  He reports developing issues with severe chest and upper stomach burning for 2 days as well as hiccups.  He had some nausea with the symptoms as well.  He was seen in the emergency room with a CT scan as below.  He was started on a course of pantoprazole 20 mg twice a day.  This completely resolved his symptoms within a few days.  He did not have any issues with chronic GERD but does know he takes Tums once a day long-term, states he is actually started these for leg cramps.    Chronically he does have some dysphagia, meats, lower esophageal area.  We usually pass with walking around when lodged and would not have to regurgitate.  Worse with pork and steak.  His dysphagia seems less prominent since starting the PPI.  Reports his bowel habits are regular if he takes lactulose daily or every other day.  Feels constipation is medication induced.  He denies any rectal bleeding or melena.  No lower abdominal pain.  Appetite good weight stable.  Wife accompanies and helps with history    Wt Readings from Last 3 Encounters:  06/26/19 154 lb 9.6 oz (70.1 kg)  06/13/19 155 lb (70.3 kg)  09/09/18 150 lb (68 kg)     Last Colonoscopy: Per patient many years ago, unable to find on file. Last Endoscopy: None prior   Past Medical History:  Past Medical History:  Diagnosis Date  . CAD (coronary artery disease)    a.  90% LAD stenosis in 1995 treated with PTCA;  b. 09/2009 CABG x 4: LIMA->LAD, VG->Diag, VG->OM, VG->PDA;  c. 11/2011 Cath: 3VD, 4/4 patent grafts, EF 70%.  . Chronic constipation   . Colonic polyp   . Complete heart block (HCC)    a. 11/2011 s/p MDT Adapta L ADDRL 1 Ser # [id number] H.  . Complication of anesthesia    PROBLEMS VOIDING AFTER PREVIOUS SURGERIES  . Diabetes mellitus without complication (HCC)    NOT ON ANY  DIABETIC MEDICATIONS  . Fasting hyperglycemia   . Hyperlipidemia   . Hypertension   . Pacemaker JULY 2013   DR.  G.TAYLOR AND DR. Tommie Sams  . Pseudophakia of both eyes   . Seizures (HCC)    a. prior to diagnosis of heart block and syncope, this was in the differential and pt was briefly on Keppra, started by ER MD.  . Syncope    a. 11/2011 18 second pauses noted on Event Monitor assoc w/ syncope  . Trauma 1952   Abdominal and chest in 1952; multiple injuries including pelvic fracture, rib fractures and ruptured bladder  . Urinary retention    PT HAS INDWELLING FOLEY CATHETER    Problem List: Patient Active Problem List   Diagnosis Date Noted  . Urinary retention 01/01/2012    Class: Present on Admission  . Pacemaker-Medtronic 12/30/2011  . Acute respiratory failure (HCC) 12/29/2011  . Rib fracture 12/29/2011  . Hypoxemia 12/29/2011  . Syncope   . Complete heart block (HCC)   . Hypertension   . Hyperlipidemia   . CAD (coronary artery disease)   . Arteriosclerotic cardiovascular disease (ASCVD)   . Fasting hyperglycemia   . Colonic polyp   . DIABETES MELLITUS, TYPE II 10/02/2009  . HYPERLIPIDEMIA 09/30/2009  . Essential hypertension  09/30/2009    Past Surgical History: Past Surgical History:  Procedure Laterality Date  . BLADDER SURGERY  1950'S  . CATARACT EXTRACTION W/PHACO Right 11/22/2012   Procedure: CATARACT EXTRACTION PHACO AND INTRAOCULAR LENS PLACEMENT (IOC);  Surgeon: Loraine Leriche T. Nile Riggs, MD;  Location: [facility];  Service: Ophthalmology;  Laterality: Right;  CDE 10.27  . CATARACT EXTRACTION W/PHACO Left 12/20/2012   Procedure: CATARACT EXTRACTION PHACO AND INTRAOCULAR LENS PLACEMENT (IOC);  Surgeon: Loraine Leriche T. Nile Riggs, MD;  Location: [facility];  Service: Ophthalmology;  Laterality: Left;  CDE:6.67  . COLONOSCOPY W/ POLYPECTOMY    . CORONARY ARTERY BYPASS GRAFT  09/2009   4 vessels  . FEMORAL HERNIA REPAIR    . INSERTION OF SUPRAPUBIC CATHETER  04/04/2012   Procedure:  INSERTION OF SUPRAPUBIC CATHETER;  Surgeon: Garnett Farm, MD;  Location: [facility];  Service: Urology;  Laterality: N/A;  . LEAD REVISION N/A 12/30/2011   Procedure: LEAD REVISION;  Surgeon: Marinus Maw, MD;  Location: [facility] CATH LAB;  Service: Cardiovascular;  Laterality: N/A;  . LEFT HEART CATHETERIZATION WITH CORONARY/GRAFT ANGIOGRAM  12/29/2011   Procedure: LEFT HEART CATHETERIZATION WITH Isabel Caprice;  Surgeon: [name], MD;  Location: [facility] CATH LAB;  Service: Cardiovascular;;  . PACEMAKER PLACEMENT    . TEMPORARY PACEMAKER INSERTION N/A 12/29/2011   Procedure: TEMPORARY PACEMAKER INSERTION;  Surgeon: [name], MD;  Location: [facility] CATH LAB;  Service: Cardiovascular;  Laterality: N/A;  . TRANSURETHRAL PROSTATECTOMY WITH GYRUS INSTRUMENTS  04/04/2012   Procedure: TRANSURETHRAL PROSTATECTOMY WITH GYRUS INSTRUMENTS;  Surgeon: Garnett Farm, MD;  Location: [facility];  Service: Urology;  Laterality: N/A;  TURP with Gyrus and Suprapubic Tube Placement  . YAG LASER APPLICATION Left 06/25/2015   Procedure: YAG LASER APPLICATION;  Surgeon: Jethro Bolus, MD;  Location: [facility];  Service: Ophthalmology;  Laterality: Left;    Allergies: Allergies  Allergen Reactions  . Penicillins Other (See Comments)    Has patient had a PCN reaction causing immediate rash, facial/tongue/throat swelling, SOB or lightheadedness with hypotension: unknown Has patient had a PCN reaction causing severe rash involving mucus membranes or skin necrosis: unknown Has patient had a PCN reaction that required hospitalization unknown Has patient had a PCN reaction occurring within the last 10 years: unknown If all of the above answers are "NO", then may proceed with Cephalosporin use.      Childhood allergy      Home Medications:  Current Outpatient Medications:  .  aspirin EC 81 MG tablet, Take 81 mg by mouth every morning., Disp: , Rfl:  .  CONSTULOSE 10 GM/15ML solution, Take 30 mLs by mouth daily.,  Disp: , Rfl:  .  Cyanocobalamin (VITAMIN B 12 PO), Take 1 tablet by mouth daily., Disp: , Rfl:  .  dextromethorphan (DELSYM) 30 MG/5ML liquid, Take 60 mg by mouth 2 (two) times daily as needed for cough., Disp: , Rfl:  .  diclofenac (CATAFLAM) 50 MG tablet, Take 50 mg by mouth 3 (three) times daily., Disp: , Rfl:  .  diphenhydrAMINE (BENADRYL) 25 MG tablet, Take 25 mg by mouth every 6 (six) hours as needed., Disp: , Rfl:  .  hydrochlorothiazide (HYDRODIURIL) 25 MG tablet, Take 25 mg by mouth every morning. , Disp: , Rfl:  .  lisinopril (PRINIVIL,ZESTRIL) 20 MG tablet, Take 20 mg by mouth 2 (two) times daily. , Disp: , Rfl:  .  LORazepam (ATIVAN) 1 MG tablet, Take 1 mg by mouth at bedtime., Disp: , Rfl:  .  metoprolol  tartrate (LOPRESSOR) 50 MG tablet, Take 50 mg by mouth 2 (two) times daily. , Disp: , Rfl:  .  pantoprazole (PROTONIX) 20 MG tablet, Take 1 tablet (20 mg total) by mouth 2 (two) times daily before a meal., Disp: 60 tablet, Rfl: 3 .  potassium chloride SA (K-DUR,KLOR-CON) 20 MEQ tablet, Take 20 mEq by mouth every morning. , Disp: , Rfl:  .  simvastatin (ZOCOR) 20 MG tablet, Take 20 mg by mouth every morning. , Disp: , Rfl:    Family History: family history includes Aneurysm in his father; Coronary artery disease in his brother; Heart block in his mother; Heart murmur in his mother; Lung cancer in his brother.    Social History:   reports that he has never smoked. His smokeless tobacco use includes chew. He reports that he does not drink alcohol or use drugs.  He reports his brother had colon cancer at age 73  Review of Systems: Constitutional: Denies weight loss/weight gain  Eyes: No changes in vision. ENT: No oral lesions, sore throat.  GI: see HPI.  Heme/Lymph: No easy bruising.  CV: No chest pain.  GU: No hematuria.  Integumentary: No rashes.  Neuro: No headaches.  Psych: No depression/anxiety.  Endocrine: No heat/cold intolerance.  Allergic/Immunologic: No urticaria.    Resp: No cough, SOB.  Musculoskeletal: No joint swelling.    Physical Examination: BP (!) 148/78 (BP Location: Right Arm, Patient Position: Sitting, Cuff Size: Large)   Pulse 78   Temp 98 F (36.7 C) (Temporal)   Ht 5\' 7"  (1.702 m)   Wt 154 lb 9.6 oz (70.1 kg)   BMI 24.21 kg/m  Gen: NAD, alert and oriented x 4, unsteady gait.  Ambulates with a cane. HEENT: PEERLA, EOMI, Neck: supple, no JVD Chest: CTA bilaterally, no wheezes, crackles, or other adventitious sounds CV: RRR, no m/g/c/r Abd: soft, NT, ND, +BS in all four quadrants; no HSM, guarding, ridigity, or rebound tenderness Ext: no edema, well perfused with 2+ pulses, Skin: no rash or lesions noted on observed skin Lymph: no noted LAD  Data: Ct scan - IMPRESSION: - 06/13/19 Mild wall thickening in the distal esophagus and proximal stomach/GE junction region. This may reflect esophagitis/gastritis, less likely infiltrating mass. Consider further evaluation with direct visualization with endoscopy.  Fluid-filled stomach. Cholelithiasis. Colonic diverticulosis. Aortic atherosclerosis.   ER labs reviewed 06/13/19  Assessment/Plan: Mr. Guzzo is a 86 y.o. male  Altan was seen today for new patient (initial visit).  Diagnoses and all orders for this visit:  Leukocytosis, unspecified type -     CBC with Differential -     COMPLETE METABOLIC PANEL WITH GFR  Hyponatremia -     CBC with Differential -     COMPLETE METABOLIC PANEL WITH GFR  Abnormal CT scan, gastrointestinal tract -     CBC with Differential -     COMPLETE METABOLIC PANEL WITH GFR  Chronic GERD  Other orders -     pantoprazole (PROTONIX) 20 MG tablet; Take 1 tablet (20 mg total) by mouth 2 (two) times daily before a meal.      1.  Abnormal CT scan-symptoms drastically improved with pantoprazole 20 mg twice daily and will have him continue this.  Suspect esophagitis but we did discuss the role of endoscopy for evaluation and excluding underlying  abnormality.  He does also have some chronic dysphagia to meats but this is improved with PPI as well.  He will continue his PPI.  Diet modifications discussed.  2.  Leukocytosis-White count in ED was 14.7, repeat today  3.  Hyponatremia-sodium 133 in ER, repeat today  4.  Chronic constipation-well-controlled with lactulose daily, reports constipation as well as allergies.  Continue current dose as he is moving stools daily.   Patient denies CP, SOB, and use of blood thinners. I discussed the risks and benefits of procedure including bleeding, perforation, infection, missed lesions, medication reactions and possible hospitalization or surgery if complications. All questions answered.  Denies prior issues with sedation except urinary retention after general anesthesia.  EGD order to be placed in chart when Covid restrictions are lifted.  I personally performed the service, non-incident to. (WP)  Tawni Pummel, [facility]

## 2019-06-27 ENCOUNTER — Encounter (INDEPENDENT_AMBULATORY_CARE_PROVIDER_SITE_OTHER): Payer: Self-pay | Admitting: *Deleted

## 2019-06-27 ENCOUNTER — Telehealth (INDEPENDENT_AMBULATORY_CARE_PROVIDER_SITE_OTHER): Payer: Self-pay | Admitting: *Deleted

## 2019-06-27 ENCOUNTER — Other Ambulatory Visit (INDEPENDENT_AMBULATORY_CARE_PROVIDER_SITE_OTHER): Payer: Self-pay | Admitting: *Deleted

## 2019-06-27 DIAGNOSIS — R933 Abnormal findings on diagnostic imaging of other parts of digestive tract: Secondary | ICD-10-CM

## 2019-06-27 LAB — COMPLETE METABOLIC PANEL WITH GFR
AG Ratio: 1.7 (calc) (ref 1.0–2.5)
ALT: 13 U/L (ref 9–46)
AST: 14 U/L (ref 10–35)
Albumin: 4.4 g/dL (ref 3.6–5.1)
Alkaline phosphatase (APISO): 69 U/L (ref 35–144)
BUN: 18 mg/dL (ref 7–25)
CO2: 32 mmol/L (ref 20–32)
Calcium: 10.9 mg/dL — ABNORMAL HIGH (ref 8.6–10.3)
Chloride: 97 mmol/L — ABNORMAL LOW (ref 98–110)
Creat: 0.93 mg/dL (ref 0.70–1.11)
GFR, Est African American: 86 mL/min/{1.73_m2} (ref >=60)
GFR, Est Non African American: 75 mL/min/{1.73_m2} (ref >=60)
Globulin: 2.6 g/dL (calc) (ref 1.9–3.7)
Glucose, Bld: 145 mg/dL — ABNORMAL HIGH (ref 65–139)
Potassium: 4.3 mmol/L (ref 3.5–5.3)
Sodium: 137 mmol/L (ref 135–146)
Total Bilirubin: 0.7 mg/dL (ref 0.2–1.2)
Total Protein: 7 g/dL (ref 6.1–8.1)

## 2019-06-27 LAB — CBC WITH DIFFERENTIAL/PLATELET
Absolute Monocytes: 738 cells/uL (ref 200–950)
Basophils Absolute: 32 cells/uL (ref 0–200)
Basophils Relative: 0.3 %
Eosinophils Absolute: 246 cells/uL (ref 15–500)
Eosinophils Relative: 2.3 %
HCT: 50.4 % — ABNORMAL HIGH (ref 38.5–50.0)
Hemoglobin: 17.4 g/dL — ABNORMAL HIGH (ref 13.2–17.1)
Lymphs Abs: 2012 cells/uL (ref 850–3900)
MCH: 30.6 pg (ref 27.0–33.0)
MCHC: 34.5 g/dL (ref 32.0–36.0)
MCV: 88.6 fL (ref 80.0–100.0)
MPV: 10.5 fL (ref 7.5–12.5)
Monocytes Relative: 6.9 %
Neutro Abs: 7672 cells/uL (ref 1500–7800)
Neutrophils Relative %: 71.7 %
Platelets: 191 10*3/uL (ref 140–400)
RBC: 5.69 10*6/uL (ref 4.20–5.80)
RDW: 12.1 % (ref 11.0–15.0)
Total Lymphocyte: 18.8 %
WBC: 10.7 10*3/uL (ref 3.8–10.8)

## 2019-06-27 NOTE — Telephone Encounter (Signed)
Spoke to patient--advised labs were OK--he mentioned he would be speaking or coming by to talk about his procedure with Dewayne Hatch

## 2019-06-28 ENCOUNTER — Ambulatory Visit (INDEPENDENT_AMBULATORY_CARE_PROVIDER_SITE_OTHER): Payer: Medicare PPO | Admitting: Internal Medicine

## 2019-06-28 ENCOUNTER — Encounter: Payer: Self-pay | Admitting: Internal Medicine

## 2019-06-28 ENCOUNTER — Other Ambulatory Visit: Payer: Self-pay

## 2019-06-28 VITALS — BP 144/74 | HR 70 | Temp 97.2°F | Ht 67.0 in | Wt 154.0 lb

## 2019-06-28 DIAGNOSIS — I442 Atrioventricular block, complete: Secondary | ICD-10-CM

## 2019-06-28 NOTE — Patient Instructions (Signed)
Medication Instructions:  Your physician recommends that you continue on your current medications as directed. Please refer to the Current Medication list given to you today.  *If you need a refill on your cardiac medications before your next appointment, please call your pharmacy*  Lab Work: NONE  If you have labs (blood work) drawn today and your tests are completely normal, you will receive your results only by: . MyChart Message (if you have MyChart) OR . A paper copy in the mail If you have any lab test that is abnormal or we need to change your treatment, we will call you to review the results.  Testing/Procedures: NONE   Follow-Up: At [facility], you and your health needs are our priority.  As part of our continuing mission to provide you with exceptional heart care, we have created designated Provider Care Teams.  These Care Teams include your primary Cardiologist (physician) and Advanced Practice Providers (APPs -  Physician Assistants and Nurse Practitioners) who all work together to provide you with the care you need, when you need it.  Your next appointment:   1 year(s)  The format for your next appointment:   In Person  Provider:   Gregg Taylor, MD  Other Instructions Thank you for choosing [facility]!    

## 2019-06-28 NOTE — Progress Notes (Signed)
HPI [name] returns today for followup. He is a pleasant 86 yo man with [place] syncope and CHB, s/p PPM insertion. He has done well in the interim with no chest pain or sob. He has weakness. He denies peripheral edema. He had heartburn and was treated with protonix and his symptoms resolved.  Allergies  Allergen Reactions  . Penicillins Other (See Comments)    Has patient had a PCN reaction causing immediate rash, facial/tongue/throat swelling, SOB or lightheadedness with hypotension: unknown Has patient had a PCN reaction causing severe rash involving mucus membranes or skin necrosis: unknown Has patient had a PCN reaction that required hospitalization unknown Has patient had a PCN reaction occurring within the last 10 years: unknown If all of the above answers are "NO", then may proceed with Cephalosporin use.      Childhood allergy     Current Outpatient Medications  Medication Sig Dispense Refill  . aspirin EC 81 MG tablet Take 81 mg by mouth every morning.    . CONSTULOSE 10 GM/15ML solution Take 30 mLs by mouth daily.    . Cyanocobalamin (VITAMIN B 12 PO) Take 1 tablet by mouth daily.    Marland Kitchen dextromethorphan (DELSYM) 30 MG/5ML liquid Take 60 mg by mouth 2 (two) times daily as needed for cough.    . diclofenac (CATAFLAM) 50 MG tablet Take 50 mg by mouth 3 (three) times daily.    . diphenhydrAMINE (BENADRYL) 25 MG tablet Take 25 mg by mouth every 6 (six) hours as needed.    . hydrochlorothiazide (HYDRODIURIL) 25 MG tablet Take 25 mg by mouth every morning.     Marland Kitchen lisinopril (PRINIVIL,ZESTRIL) 20 MG tablet Take 20 mg by mouth 2 (two) times daily.     Marland Kitchen LORazepam (ATIVAN) 1 MG tablet Take 1 mg by mouth at bedtime.    . metoprolol tartrate (LOPRESSOR) 50 MG tablet Take 50 mg by mouth 2 (two) times daily.     . pantoprazole (PROTONIX) 20 MG tablet Take 1 tablet (20 mg total) by mouth 2 (two) times daily before a meal. 60 tablet 3  . potassium chloride SA  (K-DUR,KLOR-CON) 20 MEQ tablet Take 20 mEq by mouth every morning.     . simvastatin (ZOCOR) 20 MG tablet Take 20 mg by mouth every morning.      No current facility-administered medications for this visit.     Past Medical History:  Diagnosis Date  . CAD (coronary artery disease)    a.  90% LAD stenosis in 1995 treated with PTCA;  b. 09/2009 CABG x 4: LIMA->LAD, VG->Diag, VG->OM, VG->PDA;  c. 11/2011 Cath: 3VD, 4/4 patent grafts, EF 70%.  . Chronic constipation   . Colonic polyp   . Complete heart block (HCC)    a. 11/2011 s/p MDT Adapta L ADDRL 1 Ser # [id number] H.  . Complication of anesthesia    PROBLEMS VOIDING AFTER PREVIOUS SURGERIES  . Diabetes mellitus without complication (HCC)    NOT ON ANY DIABETIC MEDICATIONS  . Fasting hyperglycemia   . Hyperlipidemia   . Hypertension   . Pacemaker JULY 2013   DR.  G.Aurianna Earlywine AND DR. Tommie Sams  . Pseudophakia of both eyes   . Seizures (HCC)    a. prior to diagnosis of heart block and syncope, this was in the differential and pt was briefly on Keppra, started by ER MD.  . Syncope    a. 11/2011 18 second pauses noted on Event Monitor assoc w/ syncope  .  Trauma 1952   Abdominal and chest in 1952; multiple injuries including pelvic fracture, rib fractures and ruptured bladder  . Urinary retention    PT HAS INDWELLING FOLEY CATHETER    ROS:   All systems reviewed and negative except as noted in the HPI.   Past Surgical History:  Procedure Laterality Date  . BLADDER SURGERY  1950'S  . CATARACT EXTRACTION W/PHACO Right 11/22/2012   Procedure: CATARACT EXTRACTION PHACO AND INTRAOCULAR LENS PLACEMENT (IOC);  Surgeon: Loraine Leriche T. Nile Riggs, MD;  Location: [facility];  Service: Ophthalmology;  Laterality: Right;  CDE 10.27  . CATARACT EXTRACTION W/PHACO Left 12/20/2012   Procedure: CATARACT EXTRACTION PHACO AND INTRAOCULAR LENS PLACEMENT (IOC);  Surgeon: Loraine Leriche T. Nile Riggs, MD;  Location: [facility];  Service: Ophthalmology;  Laterality: Left;  CDE:6.67  .  COLONOSCOPY W/ POLYPECTOMY    . CORONARY ARTERY BYPASS GRAFT  09/2009   4 vessels  . FEMORAL HERNIA REPAIR    . INSERTION OF SUPRAPUBIC CATHETER  04/04/2012   Procedure: INSERTION OF SUPRAPUBIC CATHETER;  Surgeon: Garnett Farm, MD;  Location: [facility];  Service: Urology;  Laterality: N/A;  . LEAD REVISION N/A 12/30/2011   Procedure: LEAD REVISION;  Surgeon: Marinus Maw, MD;  Location: [facility] CATH LAB;  Service: Cardiovascular;  Laterality: N/A;  . LEFT HEART CATHETERIZATION WITH CORONARY/GRAFT ANGIOGRAM  12/29/2011   Procedure: LEFT HEART CATHETERIZATION WITH Isabel Caprice;  Surgeon: [name], MD;  Location: [facility] CATH LAB;  Service: Cardiovascular;;  . PACEMAKER PLACEMENT    . TEMPORARY PACEMAKER INSERTION N/A 12/29/2011   Procedure: TEMPORARY PACEMAKER INSERTION;  Surgeon: [name], MD;  Location: [facility] CATH LAB;  Service: Cardiovascular;  Laterality: N/A;  . TRANSURETHRAL PROSTATECTOMY WITH GYRUS INSTRUMENTS  04/04/2012   Procedure: TRANSURETHRAL PROSTATECTOMY WITH GYRUS INSTRUMENTS;  Surgeon: Garnett Farm, MD;  Location: [facility];  Service: Urology;  Laterality: N/A;  TURP with Gyrus and Suprapubic Tube Placement  . YAG LASER APPLICATION Left 06/25/2015   Procedure: YAG LASER APPLICATION;  Surgeon: Jethro Bolus, MD;  Location: [facility];  Service: Ophthalmology;  Laterality: Left;     Family History  Problem Relation Age of Onset  . Heart block Mother   . Heart murmur Mother   . Aneurysm Father   . Coronary artery disease Brother        x3  . Lung cancer Brother      Social History   Socioeconomic History  . Marital status: Married    Spouse name: Not on file  . Number of children: Not on file  . Years of education: Not on file  . Highest education level: Not on file  Occupational History  . Occupation: Retired  Tobacco Use  . Smoking status: Never Smoker  . Smokeless tobacco: Current User    Types: Chew  Substance and Sexual Activity  . Alcohol use: No  .  Drug use: No  . Sexual activity: Yes  Other Topics Concern  . Not on file  Social History Narrative  . Not on file   Social Determinants of Health   Financial Resource Strain:   . Difficulty of Paying Living Expenses: Not on file  Food Insecurity:   . Worried About Programme researcher, broadcasting/film/video in the Last Year: Not on file  . Ran Out of Food in the Last Year: Not on file  Transportation Needs:   . Lack of Transportation (Medical): Not on file  . Lack of Transportation (Non-Medical): Not on file  Physical Activity:   .  Days of Exercise per Week: Not on file  . Minutes of Exercise per Session: Not on file  Stress:   . Feeling of Stress : Not on file  Social Connections:   . Frequency of Communication with Friends and Family: Not on file  . Frequency of Social Gatherings with Friends and Family: Not on file  . Attends Religious Services: Not on file  . Active Member of Clubs or Organizations: Not on file  . Attends Archivist Meetings: Not on file  . Marital Status: Not on file  Intimate Partner Violence:   . Fear of Current or Ex-Partner: Not on file  . Emotionally Abused: Not on file  . Physically Abused: Not on file  . Sexually Abused: Not on file     BP (!) 144/74   Pulse 70   Temp (!) 97.2 F (36.2 C)   Ht 5\' 7"  (1.702 m)   Wt 154 lb (69.9 kg)   SpO2 96%   BMI 24.12 kg/m   Physical Exam:  Well appearing elderly man, NAD  HEENT: Unremarkable Neck:  No JVD, no thyromegally Lymphatics:  No adenopathy Back:  No CVA tenderness Lungs:  Clear with no wheezes HEART:  Regular rate rhythm, no murmurs, no rubs, no clicks Abd:  soft, positive bowel sounds, no organomegally, no rebound, no guarding Ext:  2 plus pulses, no edema, no cyanosis, no clubbing Skin:  No rashes no nodules Neuro:  CN II through XII intact, motor grossly intact  DEVICE  Normal device function.  See PaceArt for details.   Assess/Plan: 1. CHB - he is asymptomatic, s/p PPM insertion. 2.  Sinus node dysfunction - he has no escape today. He is asymptomatic. 3. PPM - his medtronic DDD PM is working normally. We will recheck in several months 4. Chest pain - his symptoms are related to acid reflux and have resolved. He is pending endoscopy.  Mikle Bosworth.D.

## 2019-06-29 ENCOUNTER — Other Ambulatory Visit (INDEPENDENT_AMBULATORY_CARE_PROVIDER_SITE_OTHER): Payer: Self-pay | Admitting: *Deleted

## 2019-07-03 ENCOUNTER — Encounter (HOSPITAL_COMMUNITY): Payer: Self-pay

## 2019-07-03 ENCOUNTER — Other Ambulatory Visit (HOSPITAL_COMMUNITY)
Admission: RE | Admit: 2019-07-03 | Discharge: 2019-07-03 | Disposition: A | Discharge status: Home / Self Care | Payer: Medicare PPO | Source: Ambulatory Visit | Attending: Internal Medicine | Admitting: Internal Medicine

## 2019-07-03 ENCOUNTER — Encounter (HOSPITAL_COMMUNITY)
Admission: RE | Admit: 2019-07-03 | Discharge: 2019-07-03 | Disposition: A | Discharge status: Home / Self Care | Payer: 59 | Source: Ambulatory Visit | Attending: Internal Medicine | Admitting: Internal Medicine

## 2019-07-03 ENCOUNTER — Other Ambulatory Visit: Payer: Self-pay

## 2019-07-03 DIAGNOSIS — Z20822 Contact with and (suspected) exposure to covid-19: Secondary | ICD-10-CM | POA: Diagnosis not present

## 2019-07-03 DIAGNOSIS — Z01812 Encounter for preprocedural laboratory examination: Secondary | ICD-10-CM | POA: Diagnosis present

## 2019-07-03 DIAGNOSIS — R933 Abnormal findings on diagnostic imaging of other parts of digestive tract: Secondary | ICD-10-CM

## 2019-07-03 HISTORY — DX: Unspecified osteoarthritis, unspecified site: M19.90

## 2019-07-03 HISTORY — DX: Gastro-esophageal reflux disease without esophagitis: K21.9

## 2019-07-03 LAB — SARS CORONAVIRUS 2 (TAT 6-24 HRS): SARS Coronavirus 2: NEGATIVE

## 2019-07-03 NOTE — Patient Instructions (Signed)
Guy Roberson  07/03/2019     @PREFPERIOPPHARMACY @   Your procedure is scheduled on  2/3/20121 .  Report to [facility] at  1130   A.M.  Call this number if you have problems the morning of surgery:  [phone]   Remember:  Follow the diet instructions given to you by Dr Olevia Perches office.                      Take these medicines the morning of surgery with A SIP OF WATER  Zyrtec, lisinopril, metoprolol, protonix.    Do not wear jewelry, make-up or nail polish.  Do not wear lotions, powders, or perfumes. Please wear deodorant and brush your teeth.  Do not shave 48 hours prior to surgery.  Men may shave face and neck.  Do not bring valuables to the hospital.  [facility] is not responsible for any belongings or valuables.  Contacts, dentures or bridgework may not be worn into surgery.  Leave your suitcase in the car.  After surgery it may be brought to your room.  For patients admitted to the hospital, discharge time will be determined by your treatment team.  Patients discharged the day of surgery will not be allowed to drive home.   Name and phone number of your driver:   family Special instructions:  None  Please read over the following fact sheets that you were given. Anesthesia Post-op Instructions and Care and Recovery After Surgery       Upper Endoscopy, Adult, Care After This sheet gives you information about how to care for yourself after your procedure. Your health care provider may also give you more specific instructions. If you have problems or questions, contact your health care provider. What can I expect after the procedure? After the procedure, it is common to have:  A sore throat.  Mild stomach pain or discomfort.  Bloating.  Nausea. Follow these instructions at home:   Follow instructions from your health care provider about what to eat or drink after your procedure.  Return to your normal activities as told by your health care provider.  Ask your health care provider what activities are safe for you.  Take over-the-counter and prescription medicines only as told by your health care provider.  Do not drive for 24 hours if you were given a sedative during your procedure.  Keep all follow-up visits as told by your health care provider. This is important. Contact a health care provider if you have:  A sore throat that lasts longer than one day.  Trouble swallowing. Get help right away if:  You vomit blood or your vomit looks like coffee grounds.  You have: ? A fever. ? Bloody, black, or tarry stools. ? A severe sore throat or you cannot swallow. ? Difficulty breathing. ? Severe pain in your chest or abdomen. Summary  After the procedure, it is common to have a sore throat, mild stomach discomfort, bloating, and nausea.  Do not drive for 24 hours if you were given a sedative during the procedure.  Follow instructions from your health care provider about what to eat or drink after your procedure.  Return to your normal activities as told by your health care provider. This information is not intended to replace advice given to you by your health care provider. Make sure you discuss any questions you have with your health care provider. Document Revised: 11/09/2017 Document Reviewed: 10/18/2017 Elsevier Patient Education  2020 [employer] Monitored Anesthesia Care, Care After These instructions provide you with information about caring for yourself after your procedure. Your health care provider may also give you more specific instructions. Your treatment has been planned according to current medical practices, but problems sometimes occur. Call your health care provider if you have any problems or questions after your procedure. What can I expect after the procedure? After your procedure, you may:  Feel sleepy for several hours.  Feel clumsy and have poor balance for several hours.  Feel forgetful about what  happened after the procedure.  Have poor judgment for several hours.  Feel nauseous or vomit.  Have a sore throat if you had a breathing tube during the procedure. Follow these instructions at home: For at least 24 hours after the procedure:      Have a responsible adult stay with you. It is important to have someone help care for you until you are awake and alert.  Rest as needed.  Do not: ? Participate in activities in which you could fall or become injured. ? Drive. ? Use heavy machinery. ? Drink alcohol. ? Take sleeping pills or medicines that cause drowsiness. ? Make important decisions or sign legal documents. ? Take care of children on your own. Eating and drinking  Follow the diet that is recommended by your health care provider.  If you vomit, drink water, juice, or soup when you can drink without vomiting.  Make sure you have little or no nausea before eating solid foods. General instructions  Take over-the-counter and prescription medicines only as told by your health care provider.  If you have sleep apnea, surgery and certain medicines can increase your risk for breathing problems. Follow instructions from your health care provider about wearing your sleep device: ? Anytime you are sleeping, including during daytime naps. ? While taking prescription pain medicines, sleeping medicines, or medicines that make you drowsy.  If you smoke, do not smoke without supervision.  Keep all follow-up visits as told by your health care provider. This is important. Contact a health care provider if:  You keep feeling nauseous or you keep vomiting.  You feel light-headed.  You develop a rash.  You have a fever. Get help right away if:  You have trouble breathing. Summary  For several hours after your procedure, you may feel sleepy and have poor judgment.  Have a responsible adult stay with you for at least 24 hours or until you are awake and alert. This  information is not intended to replace advice given to you by your health care provider. Make sure you discuss any questions you have with your health care provider. Document Revised: 08/16/2017 Document Reviewed: 09/08/2015 Elsevier Patient Education  2020 [employer].

## 2019-07-05 ENCOUNTER — Ambulatory Visit (HOSPITAL_COMMUNITY): Payer: Medicare PPO | Admitting: Anesthesiology

## 2019-07-05 ENCOUNTER — Ambulatory Visit (HOSPITAL_COMMUNITY)
Admission: RE | Admit: 2019-07-05 | Discharge: 2019-07-05 | Disposition: A | Discharge status: Home / Self Care | Payer: Medicare PPO | Source: Ambulatory Visit | Attending: Internal Medicine | Admitting: Internal Medicine

## 2019-07-05 ENCOUNTER — Encounter (HOSPITAL_COMMUNITY)
Admission: RE | Disposition: A | Discharge status: Home / Self Care | Payer: Self-pay | Source: Ambulatory Visit | Attending: Internal Medicine

## 2019-07-05 ENCOUNTER — Encounter (HOSPITAL_COMMUNITY): Payer: Self-pay | Admitting: Internal Medicine

## 2019-07-05 DIAGNOSIS — K222 Esophageal obstruction: Secondary | ICD-10-CM

## 2019-07-05 DIAGNOSIS — R1013 Epigastric pain: Secondary | ICD-10-CM | POA: Diagnosis present

## 2019-07-05 DIAGNOSIS — R933 Abnormal findings on diagnostic imaging of other parts of digestive tract: Secondary | ICD-10-CM

## 2019-07-05 DIAGNOSIS — Z951 Presence of aortocoronary bypass graft: Secondary | ICD-10-CM | POA: Insufficient documentation

## 2019-07-05 DIAGNOSIS — Z8249 Family history of ischemic heart disease and other diseases of the circulatory system: Secondary | ICD-10-CM | POA: Insufficient documentation

## 2019-07-05 DIAGNOSIS — I10 Essential (primary) hypertension: Secondary | ICD-10-CM | POA: Insufficient documentation

## 2019-07-05 DIAGNOSIS — E785 Hyperlipidemia, unspecified: Secondary | ICD-10-CM | POA: Insufficient documentation

## 2019-07-05 DIAGNOSIS — R339 Retention of urine, unspecified: Secondary | ICD-10-CM | POA: Diagnosis not present

## 2019-07-05 DIAGNOSIS — M1990 Unspecified osteoarthritis, unspecified site: Secondary | ICD-10-CM | POA: Diagnosis not present

## 2019-07-05 DIAGNOSIS — Z79899 Other long term (current) drug therapy: Secondary | ICD-10-CM | POA: Diagnosis not present

## 2019-07-05 DIAGNOSIS — K2980 Duodenitis without bleeding: Secondary | ICD-10-CM | POA: Insufficient documentation

## 2019-07-05 DIAGNOSIS — E119 Type 2 diabetes mellitus without complications: Secondary | ICD-10-CM | POA: Insufficient documentation

## 2019-07-05 DIAGNOSIS — K2970 Gastritis, unspecified, without bleeding: Secondary | ICD-10-CM

## 2019-07-05 DIAGNOSIS — K449 Diaphragmatic hernia without obstruction or gangrene: Secondary | ICD-10-CM

## 2019-07-05 DIAGNOSIS — B9681 Helicobacter pylori [H. pylori] as the cause of diseases classified elsewhere: Secondary | ICD-10-CM | POA: Insufficient documentation

## 2019-07-05 DIAGNOSIS — R1314 Dysphagia, pharyngoesophageal phase: Secondary | ICD-10-CM | POA: Insufficient documentation

## 2019-07-05 DIAGNOSIS — Z880 Allergy status to penicillin: Secondary | ICD-10-CM | POA: Insufficient documentation

## 2019-07-05 DIAGNOSIS — Z950 Presence of cardiac pacemaker: Secondary | ICD-10-CM | POA: Insufficient documentation

## 2019-07-05 DIAGNOSIS — Z7982 Long term (current) use of aspirin: Secondary | ICD-10-CM | POA: Diagnosis not present

## 2019-07-05 DIAGNOSIS — I2510 Atherosclerotic heart disease of native coronary artery without angina pectoris: Secondary | ICD-10-CM | POA: Insufficient documentation

## 2019-07-05 DIAGNOSIS — K2950 Unspecified chronic gastritis without bleeding: Secondary | ICD-10-CM | POA: Insufficient documentation

## 2019-07-05 HISTORY — PX: ESOPHAGEAL DILATION: SHX303

## 2019-07-05 HISTORY — PX: ESOPHAGOGASTRODUODENOSCOPY (EGD) WITH PROPOFOL: SHX5813

## 2019-07-05 HISTORY — PX: BIOPSY: SHX5522

## 2019-07-05 LAB — GLUCOSE, CAPILLARY
Glucose-Capillary: 158 mg/dL — ABNORMAL HIGH (ref 70–99)
Glucose-Capillary: 145 mg/dL — ABNORMAL HIGH (ref 70–99)

## 2019-07-05 SURGERY — ESOPHAGOGASTRODUODENOSCOPY (EGD) WITH PROPOFOL
Anesthesia: General

## 2019-07-05 MED ORDER — LIDOCAINE 2% (20 MG/ML) 5 ML SYRINGE
INTRAMUSCULAR | Status: AC
  Filled 2019-07-05: qty 10

## 2019-07-05 MED ORDER — GLYCOPYRROLATE PF 0.2 MG/ML IJ SOSY
PREFILLED_SYRINGE | INTRAMUSCULAR | Status: AC
  Filled 2019-07-05: qty 4

## 2019-07-05 MED ORDER — LACTATED RINGERS IV SOLN: INTRAVENOUS | Status: DC | PRN

## 2019-07-05 MED ORDER — KETAMINE HCL 10 MG/ML IJ SOLN
INTRAMUSCULAR | Status: DC | PRN
  Administered 2019-07-05: 20 mg via INTRAVENOUS

## 2019-07-05 MED ORDER — PHENYLEPHRINE 40 MCG/ML (10ML) SYRINGE FOR IV PUSH (FOR BLOOD PRESSURE SUPPORT)
PREFILLED_SYRINGE | INTRAVENOUS | Status: DC | PRN
  Administered 2019-07-05 (×3): 40 ug via INTRAVENOUS

## 2019-07-05 MED ORDER — KETAMINE HCL 50 MG/5ML IJ SOSY
PREFILLED_SYRINGE | INTRAMUSCULAR | Status: AC
  Filled 2019-07-05: qty 5

## 2019-07-05 MED ORDER — PROPOFOL 10 MG/ML IV BOLUS
INTRAVENOUS | Status: AC
  Filled 2019-07-05: qty 20

## 2019-07-05 MED ORDER — LIDOCAINE HCL (CARDIAC) PF 100 MG/5ML IV SOSY
PREFILLED_SYRINGE | INTRAVENOUS | Status: DC | PRN
  Administered 2019-07-05: 40 mg via INTRATRACHEAL

## 2019-07-05 MED ORDER — PROPOFOL 10 MG/ML IV BOLUS
INTRAVENOUS | Status: DC | PRN
  Administered 2019-07-05: 20 mg via INTRAVENOUS

## 2019-07-05 MED ORDER — PROPOFOL 500 MG/50ML IV EMUL
INTRAVENOUS | Status: DC | PRN
  Administered 2019-07-05: 125 ug/kg/min via INTRAVENOUS

## 2019-07-05 NOTE — Op Note (Signed)
[facility] Patient Name: Guy Roberson Procedure Date: 07/05/2019 10:51 AM MRN: [id number] Date of Birth: 08-05-1931 Attending MD: Lionel December , MD CSN: [id number] Age: 86 Admit Type: Outpatient Procedure:                Upper GI endoscopy Indications:              Epigastric abdominal pain, Esophageal dysphagia,                            Abnormal CT of the GI tract Providers:                Lionel December, MD, Loma Messing B. Patsy Lager, RN, Pandora Leiter, Technician Referring MD:             [name]. MD Medicines:                Propofol per Anesthesia Complications:            No immediate complications. Estimated Blood Loss:     Estimated blood loss was minimal. Procedure:                Pre-Anesthesia Assessment:                           - Prior to the procedure, a History and Physical                            was performed, and patient medications and                            allergies were reviewed. The patient's tolerance of                            previous anesthesia was also reviewed. The risks                            and benefits of the procedure and the sedation                            options and risks were discussed with the patient.                            All questions were answered, and informed consent                            was obtained. Prior Anticoagulants: The patient has                            taken no previous anticoagulant or antiplatelet                            agents except for aspirin. ASA Grade Assessment:  III - A patient with severe systemic disease. After                            reviewing the risks and benefits, the patient was                            deemed in satisfactory condition to undergo the                            procedure.                           After obtaining informed consent, the endoscope was                            passed under direct  vision. Throughout the                            procedure, the patient's blood pressure, pulse, and                            oxygen saturations were monitored continuously. The                            GIF-H190 ([id number]) scope was introduced through the                            mouth, and advanced to the second part of duodenum.                            The upper GI endoscopy was accomplished without                            difficulty. The patient tolerated the procedure                            well. Scope In: 11:43:42 AM Scope Out: 11:56:31 AM Total Procedure Duration: 0 hours 12 minutes 49 seconds  Findings:      The examined esophagus was normal.      A mild Schatzki ring was found at the gastroesophageal junction. The       dilation site was examined and showed no change and no bleeding, mucosal       tear or perforation. A TTS dilator was passed through the scope.       Dilation with a 15-16.5-18 mm balloon dilator was performed to 15 mm,       16.5 mm and 18 mm. The dilation site was examined and showed no change       and no bleeding, mucosal tear or perforation.      A 2 cm hiatal hernia was present. Irregular GEJ at 40 cfrom incisors.      Patchy mild inflammation characterized by congestion (edema), erosions,       erythema and friability was found in the gastric antrum and at the       pylorus. Biopsies were taken with a cold forceps for  histology.      The exam of the stomach was otherwise normal.      Patchy mild inflammation characterized by congestion (edema), erosions       and erythema was found in the duodenal bulb.      The second portion of the duodenum was normal. Impression:               - Normal esophagus.                           - Mild Schatzki ring. Dilated and disrupted with                            focal biopsy.                           - 2 cm hiatal hernia.                           - Gastritis. Biopsied.                           -  Duodenitis.                           - Normal second portion of the duodenum. Moderate Sedation:      Per Anesthesia Care Recommendation:           - Patient has a contact number available for                            emergencies. The signs and symptoms of potential                            delayed complications were discussed with the                            patient. Return to normal activities tomorrow.                            Written discharge instructions were provided to the                            patient.                           - Resume previous diet today.                           - Continue present medications.                           - No aspirin, ibuprofen, naproxen, or other                            non-steroidal anti-inflammatory drugs for 1 day.                           -  Await pathology results. Procedure Code(s):        --- Professional ---                           [phone], Esophagogastroduodenoscopy, flexible,                            transoral; with transendoscopic balloon dilation of                            esophagus (less than 30 mm diameter) Diagnosis Code(s):        --- Professional ---                           K22.2, Esophageal obstruction                           K44.9, Diaphragmatic hernia without obstruction or                            gangrene                           K29.70, Gastritis, unspecified, without bleeding                           K29.80, Duodenitis without bleeding                           R10.13, Epigastric pain                           R13.14, Dysphagia, pharyngoesophageal phase                           R93.3, Abnormal findings on diagnostic imaging of                            other parts of digestive tract CPT copyright 2019 American [employer] Association. All rights reserved. The codes documented in this report are preliminary and upon coder review may  be revised to meet current compliance  requirements. Lionel December, MD Lionel December, MD 07/05/2019 12:11:36 PM This report has been signed electronically. Number of Addenda: 0

## 2019-07-05 NOTE — H&P (Addendum)
Guy Roberson is an 86 y.o. male.   Chief Complaint: Patient is here for esophagogastroduodenoscopy and possible esophageal dilation. HPI: Patient is 86 year old Caucasian male who has history of GERD.  Patient was seen in emergency room few weeks ago for abdominal pain.  Abdominal pelvic CT which revealed wall thickening to distal esophagus.  Patient does give history of solid food dysphagia.  He states he had an episode about a year ago relieved with regurgitation of food.  He states he has not had an episode recently.  However indication for CT states abdominal pain hiccups and dysphagia.  Patient denies nausea vomiting or melena.  States he has not had any more pain since he has been on pantoprazole he has not had any abdominal pain since his ER visit.  He is prone to constipation.  He says he is on lactulose which helps. He is on aspirin but does not  OTC NSAIDs.  Past Medical History:  Diagnosis Date  . Arthritis   . CAD (coronary artery disease)    a.  90% LAD stenosis in 1995 treated with PTCA;  b. 09/2009 CABG x 4: LIMA->LAD, VG->Diag, VG->OM, VG->PDA;  c. 11/2011 Cath: 3VD, 4/4 patent grafts, EF 70%.  . Chronic constipation   . Colonic polyp   . Complete heart block (HCC)    a. 11/2011 s/p MDT Adapta L ADDRL 1 Ser # [id number] H.  . Complication of anesthesia    PROBLEMS VOIDING AFTER PREVIOUS SURGERIES  . Diabetes mellitus without complication (HCC)    NOT ON ANY DIABETIC MEDICATIONS  . Fasting hyperglycemia   . GERD (gastroesophageal reflux disease)   . Hyperlipidemia   . Hypertension   . Pacemaker JULY 2013   DR.  G.TAYLOR AND DR. Tommie Sams  . Pseudophakia of both eyes   . Seizures (HCC)    a. prior to diagnosis of heart block and syncope, this was in the differential and pt was briefly on Keppra, started by ER MD.  . Syncope    a. 11/2011 18 second pauses noted on Event Monitor assoc w/ syncope  . Trauma 1952   Abdominal and chest in 1952; multiple injuries including pelvic  fracture, rib fractures and ruptured bladder  . Urinary retention    PT HAS INDWELLING FOLEY CATHETER    Past Surgical History:  Procedure Laterality Date  . BLADDER SURGERY  1950'S  . CATARACT EXTRACTION W/PHACO Right 11/22/2012   Procedure: CATARACT EXTRACTION PHACO AND INTRAOCULAR LENS PLACEMENT (IOC);  Surgeon: Loraine Leriche T. Nile Riggs, MD;  Location: [facility];  Service: Ophthalmology;  Laterality: Right;  CDE 10.27  . CATARACT EXTRACTION W/PHACO Left 12/20/2012   Procedure: CATARACT EXTRACTION PHACO AND INTRAOCULAR LENS PLACEMENT (IOC);  Surgeon: Loraine Leriche T. Nile Riggs, MD;  Location: [facility];  Service: Ophthalmology;  Laterality: Left;  CDE:6.67  . COLONOSCOPY W/ POLYPECTOMY    . CORONARY ARTERY BYPASS GRAFT  09/2009   4 vessels  . FEMORAL HERNIA REPAIR    . INSERTION OF SUPRAPUBIC CATHETER  04/04/2012   Procedure: INSERTION OF SUPRAPUBIC CATHETER;  Surgeon: Garnett Farm, MD;  Location: [facility];  Service: Urology;  Laterality: N/A;  . LEAD REVISION N/A 12/30/2011   Procedure: LEAD REVISION;  Surgeon: Marinus Maw, MD;  Location: [facility] CATH LAB;  Service: Cardiovascular;  Laterality: N/A;  . LEFT HEART CATHETERIZATION WITH CORONARY/GRAFT ANGIOGRAM  12/29/2011   Procedure: LEFT HEART CATHETERIZATION WITH Isabel Caprice;  Surgeon: [name], MD;  Location: [facility] CATH LAB;  Service: Cardiovascular;;  .  PACEMAKER PLACEMENT    . TEMPORARY PACEMAKER INSERTION N/A 12/29/2011   Procedure: TEMPORARY PACEMAKER INSERTION;  Surgeon: Sherren Mocha, MD;  Location: [facility] CATH LAB;  Service: Cardiovascular;  Laterality: N/A;  . TRANSURETHRAL PROSTATECTOMY WITH GYRUS INSTRUMENTS  04/04/2012   Procedure: TRANSURETHRAL PROSTATECTOMY WITH GYRUS INSTRUMENTS;  Surgeon: Claybon Jabs, MD;  Location: [facility];  Service: Urology;  Laterality: N/A;  TURP with Gyrus and Suprapubic Tube Placement  . YAG LASER APPLICATION Left 6/96/2952   Procedure: YAG LASER APPLICATION;  Surgeon: Rutherford Guys, MD;  Location: [facility];  Service:  Ophthalmology;  Laterality: Left;    Family History  Problem Relation Age of Onset  . Heart block Mother   . Heart murmur Mother   . Aneurysm Father   . Coronary artery disease Brother        x3  . Lung cancer Brother    Social History:  reports that he has never smoked. His smokeless tobacco use includes chew. He reports that he does not drink alcohol or use drugs.  Allergies:  Allergies  Allergen Reactions  . Penicillins Other (See Comments)    Unknown reaction  Did it involve swelling of the face/tongue/throat, SOB, or low BP? Unknown Did it involve sudden or severe rash/hives, skin peeling, or any reaction on the inside of your mouth or nose? Unknown Did you need to seek medical attention at a hospital or doctor's office? Unknown When did it last happen?childhood allergy If all above answers are "NO", may proceed with cephalosporin use.     Medications Prior to Admission  Medication Sig Dispense Refill  . aspirin EC 81 MG tablet Take 81 mg by mouth every morning.    . cetirizine (ZYRTEC) 10 MG tablet Take 10 mg by mouth daily as needed for allergies.    . CONSTULOSE 10 GM/15ML solution Take 30 mLs by mouth 2 (two) times daily as needed for moderate constipation.     Marland Kitchen lisinopril (PRINIVIL,ZESTRIL) 20 MG tablet Take 20 mg by mouth 2 (two) times daily.     Marland Kitchen LORazepam (ATIVAN) 1 MG tablet Take 2 mg by mouth at bedtime.     . Melatonin 3 MG CAPS Take 3 mg by mouth at bedtime.    . metoprolol tartrate (LOPRESSOR) 50 MG tablet Take 50 mg by mouth 2 (two) times daily.     . pantoprazole (PROTONIX) 20 MG tablet Take 1 tablet (20 mg total) by mouth 2 (two) times daily before a meal. 60 tablet 3  . potassium chloride SA (K-DUR,KLOR-CON) 20 MEQ tablet Take 20 mEq by mouth every morning.     . simvastatin (ZOCOR) 20 MG tablet Take 20 mg by mouth every morning.     . sulfamethoxazole-trimethoprim (BACTRIM DS) 800-160 MG tablet Take 1 tablet by mouth 2 (two) times daily as needed  (bladder infections). Take for 5 days at a time    . vitamin B-12 (CYANOCOBALAMIN) 1000 MCG tablet Take 1,000 mcg by mouth daily.      Results for orders placed or performed during the hospital encounter of 07/05/19 (from the past 48 hour(s))  Glucose, capillary     Status: Abnormal   Collection Time: 07/05/19 11:04 AM  Result Value Ref Range   Glucose-Capillary 158 (H) 70 - 99 mg/dL   No results found.  Review of Systems  Blood pressure (!) 156/75, temperature 97.8 F (36.6 C), temperature source Oral, resp. rate 18, SpO2 98 %. Physical Exam  Constitutional: He appears well-developed and well-nourished.  HENT:  Mouth/Throat: Oropharynx is clear and moist.  Eyes: Conjunctivae are normal. No scleral icterus.  Neck: No JVD present. No thyromegaly present.  Cardiovascular: Normal rate, regular rhythm and normal heart sounds.  No murmur heard. Respiratory: Effort normal and breath sounds normal.  Midsternal scar. He has pacemaker in left pectoral region.  GI:  Abdomen is symmetrical with upper right paramedian scar.  On palpation is soft and nontender with organomegaly or masses.  Musculoskeletal:        General: No edema.  Lymphadenopathy:    He has no cervical adenopathy.  Neurological: He is alert.  Skin: Skin is warm and dry.     Assessment/Plan Abnormal CT revealing wall thickening to distal esophagus. History of solid food dysphagia. Esophagogastroduodenoscopy with esophageal dilation.  Lionel December, MD 07/05/2019, 11:25 AM

## 2019-07-05 NOTE — Anesthesia Preprocedure Evaluation (Signed)
Anesthesia Evaluation  Patient identified by MRN, date of birth, ID band Patient awake  General Assessment Comment:Pt reports often has urinary retention issues after anesthesia   Reviewed: Allergy & Precautions, NPO status , Patient's Chart, lab work & pertinent test results  History of Anesthesia Complications (+) history of anesthetic complications  Airway Mallampati: II  TM Distance: >3 FB Neck ROM: Full    Dental no notable dental hx.  Has 6 upper teeth :   Pulmonary neg pulmonary ROS,    Pulmonary exam normal breath sounds clear to auscultation       Cardiovascular Exercise Tolerance: Poor hypertension, Pt. on medications + CAD  Normal cardiovascular exam+ dysrhythmias + pacemaker III Rhythm:Regular Rate:Normal  Frail -decreased ET -states falls if tries to hard  Has dual chamber pacer  Denies any recent cardiac issues    Neuro/Psych Denies Sz  Reports one syncopal episode prior to pacer placement  negative neurological ROS  negative psych ROS   GI/Hepatic Neg liver ROS, GERD  Medicated and Controlled,Here for EGD   Endo/Other  negative endocrine ROSdiabetes, Type 2  Renal/GU negative Renal ROS  negative genitourinary   Musculoskeletal  (+) Arthritis , Osteoarthritis,    Abdominal   Peds negative pediatric ROS (+)  Hematology negative hematology ROS (+)   Anesthesia Other Findings   Reproductive/Obstetrics negative OB ROS                             Anesthesia Physical Anesthesia Plan  ASA: III  Anesthesia Plan: General   Post-op Pain Management:    Induction: Intravenous  PONV Risk Score and Plan: 2 and Propofol infusion, TIVA and Treatment may vary due to age or medical condition  Airway Management Planned: Simple Face Mask and Nasal Cannula  Additional Equipment:   Intra-op Plan:   Post-operative Plan:   Informed Consent: I have reviewed the patients History  and Physical, chart, labs and discussed the procedure including the risks, benefits and alternatives for the proposed anesthesia with the patient or authorized representative who has indicated his/her understanding and acceptance.     Dental advisory given  Plan Discussed with: CRNA  Anesthesia Plan Comments: (Plan Full PPE use  Plan GA with GETA as needed d/w pt -WTP with same after Q&A  Will try to avoid narcotics -due to urinary retention issues )        Anesthesia Quick Evaluation

## 2019-07-05 NOTE — Discharge Instructions (Signed)
Resume aspirin on 07/06/2019 Resume other medications as before. Resume usual diet. No driving for 24 hours. Physician will call with biopsy results.     Upper Endoscopy, Adult, Care After This sheet gives you information about how to care for yourself after your procedure. Your health care provider may also give you more specific instructions. If you have problems or questions, contact your health care provider. What can I expect after the procedure? After the procedure, it is common to have:  A sore throat.  Mild stomach pain or discomfort.  Bloating.  Nausea. Follow these instructions at home:   Follow instructions from your health care provider about what to eat or drink after your procedure.  Return to your normal activities as told by your health care provider. Ask your health care provider what activities are safe for you.  Take over-the-counter and prescription medicines only as told by your health care provider.  Do not drive for 24 hours if you were given a sedative during your procedure.  Keep all follow-up visits as told by your health care provider. This is important. Contact a health care provider if you have:  A sore throat that lasts longer than one day.  Trouble swallowing. Get help right away if:  You vomit blood or your vomit looks like coffee grounds.  You have: ? A fever. ? Bloody, black, or tarry stools. ? A severe sore throat or you cannot swallow. ? Difficulty breathing. ? Severe pain in your chest or abdomen. Summary  After the procedure, it is common to have a sore throat, mild stomach discomfort, bloating, and nausea.  Do not drive for 24 hours if you were given a sedative during the procedure.  Follow instructions from your health care provider about what to eat or drink after your procedure.  Return to your normal activities as told by your health care provider. This information is not intended to replace advice given to you by your  health care provider. Make sure you discuss any questions you have with your health care provider. Document Revised: 11/09/2017 Document Reviewed: 10/18/2017 Elsevier Patient Education  2020 [employer]     Monitored Anesthesia Care, Care After These instructions provide you with information about caring for yourself after your procedure. Your health care provider may also give you more specific instructions. Your treatment has been planned according to current medical practices, but problems sometimes occur. Call your health care provider if you have any problems or questions after your procedure. What can I expect after the procedure? After your procedure, you may:  Feel sleepy for several hours.  Feel clumsy and have poor balance for several hours.  Feel forgetful about what happened after the procedure.  Have poor judgment for several hours.  Feel nauseous or vomit.  Have a sore throat if you had a breathing tube during the procedure. Follow these instructions at home: For at least 24 hours after the procedure:      Have a responsible adult stay with you. It is important to have someone help care for you until you are awake and alert.  Rest as needed.  Do not: ? Participate in activities in which you could fall or become injured. ? Drive. ? Use heavy machinery. ? Drink alcohol. ? Take sleeping pills or medicines that cause drowsiness. ? Make important decisions or sign legal documents. ? Take care of children on your own. Eating and drinking  Follow the diet that is recommended by your health care provider.  If you vomit, drink water, juice, or soup when you can drink without vomiting.  Make sure you have little or no nausea before eating solid foods. General instructions  Take over-the-counter and prescription medicines only as told by your health care provider.  If you have sleep apnea, surgery and certain medicines can increase your risk for breathing  problems. Follow instructions from your health care provider about wearing your sleep device: ? Anytime you are sleeping, including during daytime naps. ? While taking prescription pain medicines, sleeping medicines, or medicines that make you drowsy.  If you smoke, do not smoke without supervision.  Keep all follow-up visits as told by your health care provider. This is important. Contact a health care provider if:  You keep feeling nauseous or you keep vomiting.  You feel light-headed.  You develop a rash.  You have a fever. Get help right away if:  You have trouble breathing. Summary  For several hours after your procedure, you may feel sleepy and have poor judgment.  Have a responsible adult stay with you for at least 24 hours or until you are awake and alert. This information is not intended to replace advice given to you by your health care provider. Make sure you discuss any questions you have with your health care provider. Document Revised: 08/16/2017 Document Reviewed: 09/08/2015 Elsevier Patient Education  [place].

## 2019-07-05 NOTE — Transfer of Care (Signed)
Immediate Anesthesia Transfer of Care Note  Patient: Guy Roberson  Procedure(s) Performed: ESOPHAGOGASTRODUODENOSCOPY (EGD) WITH PROPOFOL (N/A ) ESOPHAGEAL DILATION BIOPSY  Patient Location: PACU  Anesthesia Type:General  Level of Consciousness: sedated  Airway & Oxygen Therapy: Patient Spontanous Breathing  Post-op Assessment: Report given to RN and Post -op Vital signs reviewed and stable  Post vital signs: Reviewed and stable  Last Vitals:  Vitals Value Taken Time  BP    Temp    Pulse    Resp    SpO2      Last Pain:  Vitals:   07/05/19 1139  TempSrc:   PainSc: 0-No pain      Patients Stated Pain Goal: 7 (07/05/19 1057)  Complications: No apparent anesthesia complications

## 2019-07-05 NOTE — Anesthesia Postprocedure Evaluation (Signed)
Anesthesia Post Note  Patient: Guy Roberson  Procedure(s) Performed: ESOPHAGOGASTRODUODENOSCOPY (EGD) WITH PROPOFOL (N/A ) ESOPHAGEAL DILATION BIOPSY  Patient location during evaluation: PACU Anesthesia Type: General Level of consciousness: sedated Pain management: pain level controlled Vital Signs Assessment: post-procedure vital signs reviewed and stable Respiratory status: spontaneous breathing, respiratory function stable and nonlabored ventilation Cardiovascular status: stable Postop Assessment: no apparent nausea or vomiting Anesthetic complications: no     Last Vitals:  Vitals:   07/05/19 1057  BP: (!) 156/75  Resp: 18  Temp: 36.6 C  SpO2: 98%    Last Pain:  Vitals:   07/05/19 1139  TempSrc:   PainSc: 0-No pain                 [name]

## 2019-07-06 ENCOUNTER — Other Ambulatory Visit: Payer: Self-pay

## 2019-07-07 LAB — SURGICAL PATHOLOGY

## 2019-07-08 ENCOUNTER — Other Ambulatory Visit (INDEPENDENT_AMBULATORY_CARE_PROVIDER_SITE_OTHER): Payer: Self-pay | Admitting: Internal Medicine

## 2019-07-08 MED ORDER — PYLERA 140-125-125 MG PO CAPS: 3.0000 | ORAL_CAPSULE | Freq: Three times a day (TID) | ORAL | 0 refills | Status: DC

## 2019-07-17 ENCOUNTER — Telehealth (INDEPENDENT_AMBULATORY_CARE_PROVIDER_SITE_OTHER): Payer: Self-pay | Admitting: Internal Medicine

## 2019-07-17 NOTE — Telephone Encounter (Signed)
Patient called stated he has been taking Pylera for 6 days and he just can't take it anymore - stated he stays in the bathroom - please advise - [phone]

## 2019-07-18 ENCOUNTER — Other Ambulatory Visit (INDEPENDENT_AMBULATORY_CARE_PROVIDER_SITE_OTHER): Payer: Self-pay | Admitting: *Deleted

## 2019-07-18 DIAGNOSIS — A048 Other specified bacterial intestinal infections: Secondary | ICD-10-CM

## 2019-07-18 NOTE — Telephone Encounter (Signed)
Per Dr.Rehman the patient may stop the medication. We will repeat the H-Pylori Stool Antigen in 1 month. Patient  may take Imodium for the diarrhea.  Patient was called and made aware . He states hat he has not had the diarrhea today , that he is better. So , Dr. Karilyn Cota says that the patient will not need to take the Imodium.  Patient will be sent a letter as a reminder for the stool test.

## 2019-07-26 ENCOUNTER — Encounter: Payer: Medicare Other | Admitting: Internal Medicine

## 2019-07-26 ENCOUNTER — Other Ambulatory Visit (INDEPENDENT_AMBULATORY_CARE_PROVIDER_SITE_OTHER): Payer: Self-pay | Admitting: *Deleted

## 2019-07-26 DIAGNOSIS — A048 Other specified bacterial intestinal infections: Secondary | ICD-10-CM

## 2019-08-03 ENCOUNTER — Ambulatory Visit (INDEPENDENT_AMBULATORY_CARE_PROVIDER_SITE_OTHER): Payer: 59 | Admitting: *Deleted

## 2019-08-03 DIAGNOSIS — I442 Atrioventricular block, complete: Secondary | ICD-10-CM | POA: Diagnosis not present

## 2019-08-03 LAB — CUP PACEART REMOTE DEVICE CHECK
Battery Impedance: 1267 Ohm
Battery Remaining Longevity: 50 mo
Battery Voltage: 2.77 V
Brady Statistic AP VP Percent: 99 %
Brady Statistic AP VS Percent: 0 %
Brady Statistic AS VP Percent: 1 %
Brady Statistic AS VS Percent: 0 %
Date Time Interrogation Session: 20210304082930
Implantable Lead Implant Date: 20130730
Implantable Lead Implant Date: 20130730
Implantable Lead Location: 753859
Implantable Lead Location: 753860
Implantable Lead Model: 5076
Implantable Lead Model: 5092
Implantable Pulse Generator Implant Date: 20130730
Lead Channel Impedance Value: 506 Ohm
Lead Channel Impedance Value: 511 Ohm
Lead Channel Pacing Threshold Amplitude: 0.875 V
Lead Channel Pacing Threshold Amplitude: 1 V
Lead Channel Pacing Threshold Pulse Width: 0.4 ms
Lead Channel Pacing Threshold Pulse Width: 0.4 ms
Lead Channel Setting Pacing Amplitude: 2 V
Lead Channel Setting Pacing Amplitude: 2 V
Lead Channel Setting Pacing Pulse Width: 0.4 ms
Lead Channel Setting Sensing Sensitivity: 5.6 mV

## 2019-08-04 NOTE — Progress Notes (Signed)
PPM Remote  

## 2019-08-23 LAB — HELICOBACTER PYLORI  SPECIAL ANTIGEN
MICRO NUMBER:: 10284117
SPECIMEN QUALITY: ADEQUATE

## 2019-10-02 ENCOUNTER — Other Ambulatory Visit (INDEPENDENT_AMBULATORY_CARE_PROVIDER_SITE_OTHER): Payer: Self-pay | Admitting: Gastroenterology

## 2019-11-02 ENCOUNTER — Ambulatory Visit (INDEPENDENT_AMBULATORY_CARE_PROVIDER_SITE_OTHER): Payer: 59 | Admitting: *Deleted

## 2019-11-02 DIAGNOSIS — I442 Atrioventricular block, complete: Secondary | ICD-10-CM | POA: Diagnosis not present

## 2019-11-03 ENCOUNTER — Telehealth: Payer: Self-pay

## 2019-11-03 LAB — CUP PACEART REMOTE DEVICE CHECK
Battery Impedance: 1346 Ohm
Battery Remaining Longevity: 49 mo
Battery Voltage: 2.77 V
Brady Statistic AP VP Percent: 99 %
Brady Statistic AP VS Percent: 0 %
Brady Statistic AS VP Percent: 1 %
Brady Statistic AS VS Percent: 0 %
Date Time Interrogation Session: 20210604101437
Implantable Lead Implant Date: 20130730
Implantable Lead Implant Date: 20130730
Implantable Lead Location: 753859
Implantable Lead Location: 753860
Implantable Lead Model: 5076
Implantable Lead Model: 5092
Implantable Pulse Generator Implant Date: 20130730
Lead Channel Impedance Value: 536 Ohm
Lead Channel Impedance Value: 542 Ohm
Lead Channel Pacing Threshold Amplitude: 0.875 V
Lead Channel Pacing Threshold Amplitude: 1 V
Lead Channel Pacing Threshold Pulse Width: 0.4 ms
Lead Channel Pacing Threshold Pulse Width: 0.4 ms
Lead Channel Setting Pacing Amplitude: 1.875
Lead Channel Setting Pacing Amplitude: 2 V
Lead Channel Setting Pacing Pulse Width: 0.4 ms
Lead Channel Setting Sensing Sensitivity: 5.6 mV

## 2019-11-03 NOTE — Telephone Encounter (Signed)
Spoke with patient to remind of missed remote transmission 

## 2019-11-07 NOTE — Progress Notes (Signed)
Remote pacemaker transmission.   

## 2020-01-25 ENCOUNTER — Emergency Department (HOSPITAL_COMMUNITY): Payer: Medicare PPO

## 2020-01-25 ENCOUNTER — Encounter (HOSPITAL_COMMUNITY): Payer: Self-pay

## 2020-01-25 ENCOUNTER — Other Ambulatory Visit: Payer: Self-pay

## 2020-01-25 ENCOUNTER — Emergency Department (HOSPITAL_COMMUNITY)
Admission: EM | Admit: 2020-01-25 | Discharge: 2020-01-25 | Disposition: A | Discharge status: Home / Self Care | Payer: Medicare PPO | Attending: Emergency Medicine | Admitting: Emergency Medicine

## 2020-01-25 DIAGNOSIS — N50819 Testicular pain, unspecified: Secondary | ICD-10-CM | POA: Diagnosis not present

## 2020-01-25 DIAGNOSIS — R109 Unspecified abdominal pain: Secondary | ICD-10-CM | POA: Diagnosis not present

## 2020-01-25 DIAGNOSIS — W19XXXA Unspecified fall, initial encounter: Secondary | ICD-10-CM

## 2020-01-25 DIAGNOSIS — E1165 Type 2 diabetes mellitus with hyperglycemia: Secondary | ICD-10-CM | POA: Insufficient documentation

## 2020-01-25 DIAGNOSIS — I700 Atherosclerosis of aorta: Secondary | ICD-10-CM | POA: Insufficient documentation

## 2020-01-25 DIAGNOSIS — R102 Pelvic and perineal pain: Secondary | ICD-10-CM | POA: Insufficient documentation

## 2020-01-25 DIAGNOSIS — K5730 Diverticulosis of large intestine without perforation or abscess without bleeding: Secondary | ICD-10-CM | POA: Diagnosis not present

## 2020-01-25 DIAGNOSIS — M5136 Other intervertebral disc degeneration, lumbar region: Secondary | ICD-10-CM | POA: Diagnosis not present

## 2020-01-25 DIAGNOSIS — I10 Essential (primary) hypertension: Secondary | ICD-10-CM | POA: Insufficient documentation

## 2020-01-25 DIAGNOSIS — Z79899 Other long term (current) drug therapy: Secondary | ICD-10-CM | POA: Insufficient documentation

## 2020-01-25 DIAGNOSIS — Z955 Presence of coronary angioplasty implant and graft: Secondary | ICD-10-CM | POA: Diagnosis not present

## 2020-01-25 DIAGNOSIS — M545 Low back pain, unspecified: Secondary | ICD-10-CM

## 2020-01-25 DIAGNOSIS — Z7982 Long term (current) use of aspirin: Secondary | ICD-10-CM | POA: Diagnosis not present

## 2020-01-25 DIAGNOSIS — Z950 Presence of cardiac pacemaker: Secondary | ICD-10-CM | POA: Diagnosis not present

## 2020-01-25 DIAGNOSIS — I2510 Atherosclerotic heart disease of native coronary artery without angina pectoris: Secondary | ICD-10-CM | POA: Diagnosis not present

## 2020-01-25 NOTE — Discharge Instructions (Signed)
Imaging studies were reassuring today. Follow-up with the orthopedic specialist for any continued bone or joint pain. Follow-up with the urologist for any further pain or complaints surrounding the bladder or urination.

## 2020-01-25 NOTE — ED Triage Notes (Signed)
Pt presents to ED following fall from Tuesday. Pt c/o pelvic pain from fall. Pt ambulatory to ED. Pt family states he has had multiple falls in the last few months. Pt denies dizziness states he loses his balance and falls backwards.

## 2020-01-25 NOTE — ED Provider Notes (Addendum)
[facility] EMERGENCY DEPARTMENT Provider Note   CSN: [id number] Arrival date & time: 01/25/20  0865     History Chief Complaint  Patient presents with  . Fall    Guy Roberson is a 86 y.o. male.  HPI      Guy Roberson is a 86 y.o. male, with a history of arthritis, CAD, GERD, hyperlipidemia, HTN, pacemaker, presenting to the ED accompanied by his son with pain following a fall that occurred 2 days ago. Patient is supposed to be walking with a walker at all times, however, there are times when he sets his walker off to the side.  2 days ago, he states he was doing just this, leaning over for something, lost his balance, and fell backward.  He states he landed on his lower back and buttocks.  He had a similar fall a week ago also causing lower back pain. Since his falls he has been experiencing lower back and right-sided posterior hip pain.  For the last 2 days, he has also had pelvic pain, especially worse when he feels as though he needs to urinate.  He states sometimes he has discomfort in the suprapubic region, but he also will have discomfort to the testicle/scrotum especially with sitting or moving around on the bed. Because of his suprapubic pain, his PCP prescribed him Bactrim to treat possible UTI 2 days ago. Denies anticoagulation. Denies recent head injury, LOC, neck pain, other back pain, other abdominal pain, acute weakness, numbness, N/V/D, chest pain, shortness of breath, acute difficulty urinating, dysuria, hematuria, or any other complaints.  Past Medical History:  Diagnosis Date  . Arthritis   . CAD (coronary artery disease)    a.  90% LAD stenosis in 1995 treated with PTCA;  b. 09/2009 CABG x 4: LIMA->LAD, VG->Diag, VG->OM, VG->PDA;  c. 11/2011 Cath: 3VD, 4/4 patent grafts, EF 70%.  . Chronic constipation   . Colonic polyp   . Complete heart block (HCC)    a. 11/2011 s/p MDT Adapta L ADDRL 1 Ser # [id number] H.  . Complication of anesthesia    PROBLEMS VOIDING AFTER  PREVIOUS SURGERIES  . Diabetes mellitus without complication (HCC)    NOT ON ANY DIABETIC MEDICATIONS  . Fasting hyperglycemia   . GERD (gastroesophageal reflux disease)   . Hyperlipidemia   . Hypertension   . Pacemaker JULY 2013   DR.  G.TAYLOR AND DR. Tommie Sams  . Pseudophakia of both eyes   . Seizures (HCC)    a. prior to diagnosis of heart block and syncope, this was in the differential and pt was briefly on Keppra, started by ER MD.  . Syncope    a. 11/2011 18 second pauses noted on Event Monitor assoc w/ syncope  . Trauma 1952   Abdominal and chest in 1952; multiple injuries including pelvic fracture, rib fractures and ruptured bladder  . Urinary retention    PT HAS INDWELLING FOLEY CATHETER    Patient Active Problem List   Diagnosis Date Noted  . Urinary retention 01/01/2012    Class: Present on Admission  . Pacemaker-[employer]tronic 12/30/2011  . Acute respiratory failure (HCC) 12/29/2011  . Rib fracture 12/29/2011  . Hypoxemia 12/29/2011  . Syncope   . Complete heart block (HCC)   . Hypertension   . Hyperlipidemia   . CAD (coronary artery disease)   . Arteriosclerotic cardiovascular disease (ASCVD)   . Fasting hyperglycemia   . Colonic polyp   . DIABETES MELLITUS, TYPE II 10/02/2009  .  HYPERLIPIDEMIA 09/30/2009  . Essential hypertension 09/30/2009    Past Surgical History:  Procedure Laterality Date  . BIOPSY  07/05/2019   Procedure: BIOPSY;  Surgeon: Malissa Hippo, MD;  Location: [facility];  Service: Endoscopy;;  gastric  . BLADDER SURGERY  1950'S  . CATARACT EXTRACTION W/PHACO Right 11/22/2012   Procedure: CATARACT EXTRACTION PHACO AND INTRAOCULAR LENS PLACEMENT (IOC);  Surgeon: Loraine Leriche T. Nile Riggs, MD;  Location: [facility] ORS;  Service: Ophthalmology;  Laterality: Right;  CDE 10.27  . CATARACT EXTRACTION W/PHACO Left 12/20/2012   Procedure: CATARACT EXTRACTION PHACO AND INTRAOCULAR LENS PLACEMENT (IOC);  Surgeon: Loraine Leriche T. Nile Riggs, MD;  Location: [facility] OR[facility];  Service:  Ophthalmology;  Laterality: Left;  CDE:6.67  . COLONOSCOPY W/ POLYPECTOMY    . CORONARY ARTERY BYPASS GRAFT  09/2009   4 vessels  . ESOPHAGEAL DILATION  07/05/2019   Procedure: ESOPHAGEAL DILATION;  Surgeon: Malissa Hippo, MD;  Location: [facility];  Service: Endoscopy;;  . ESOPHAGOGASTRODUODENOSCOPY (EGD) WITH PROPOFOL N/A 07/05/2019   Procedure: ESOPHAGOGASTRODUODENOSCOPY (EGD) WITH PROPOFOL;  Surgeon: Malissa Hippo, MD;  Location: [facility];  Service: Endoscopy;  Laterality: N/A;  110-office notified pt new arrival time 10:45am  . FEMORAL HERNIA REPAIR    . INSERTION OF SUPRAPUBIC CATHETER  04/04/2012   Procedure: INSERTION OF SUPRAPUBIC CATHETER;  Surgeon: Garnett Farm, MD;  Location: [facility] ORS;  Service: Urology;  Laterality: N/A;  . LEAD REVISION N/A 12/30/2011   Procedure: LEAD REVISION;  Surgeon: Marinus Maw, MD;  Location: [facility] CATH LAB;  Service: Cardiovascular;  Laterality: N/A;  . LEFT HEART CATHETERIZATION WITH CORONARY/GRAFT ANGIOGRAM  12/29/2011   Procedure: LEFT HEART CATHETERIZATION WITH Isabel Caprice;  Surgeon: [name], MD;  Location: [facility] CATH LAB;  Service: Cardiovascular;;  . PACEMAKER PLACEMENT    . TEMPORARY PACEMAKER INSERTION N/A 12/29/2011   Procedure: TEMPORARY PACEMAKER INSERTION;  Surgeon: [name], MD;  Location: [facility] CATH LAB;  Service: Cardiovascular;  Laterality: N/A;  . TRANSURETHRAL PROSTATECTOMY WITH GYRUS INSTRUMENTS  04/04/2012   Procedure: TRANSURETHRAL PROSTATECTOMY WITH GYRUS INSTRUMENTS;  Surgeon: Garnett Farm, MD;  Location: [facility] ORS;  Service: Urology;  Laterality: N/A;  TURP with Gyrus and Suprapubic Tube Placement  . YAG LASER APPLICATION Left 06/25/2015   Procedure: YAG LASER APPLICATION;  Surgeon: Jethro Bolus, MD;  Location: [facility] OR[facility];  Service: Ophthalmology;  Laterality: Left;       Family History  Problem Relation Age of Onset  . Heart block Mother   . Heart murmur Mother   . Aneurysm Father   . Coronary artery  disease Brother        x3  . Lung cancer Brother     Social History   Tobacco Use  . Smoking status: Never Smoker  . Smokeless tobacco: Current User    Types: Chew  Vaping Use  . Vaping Use: Never used  Substance Use Topics  . Alcohol use: No  . Drug use: No    Home Medications Prior to Admission medications   Medication Sig Start Date End Date Taking? Authorizing Provider  aspirin EC 81 MG tablet Take 1 tablet (81 mg total) by mouth every morning. 07/06/19   Malissa Hippo, MD  bismuth-metronidazole-tetracycline [facility]) [id number] MG capsule Take 3 capsules by mouth 4 (four) times daily -  before meals and at bedtime. 07/08/19   Malissa Hippo, MD  cetirizine (ZYRTEC) 10 MG tablet Take 10 mg by mouth daily as needed for allergies.  [provider]  CONSTULOSE 10 GM/15ML solution Take 30 mLs by mouth 2 (two) times daily as needed for moderate constipation.  07/27/18   [provider]  lisinopril (PRINIVIL,ZESTRIL) 20 MG tablet Take 20 mg by mouth 2 (two) times daily.     [provider]  LORazepam (ATIVAN) 1 MG tablet Take 2 mg by mouth at bedtime.     [provider]  Melatonin 3 MG CAPS Take 3 mg by mouth at bedtime.    [provider]  metoprolol tartrate (LOPRESSOR) 50 MG tablet Take 50 mg by mouth 2 (two) times daily.     [provider]  pantoprazole (PROTONIX) 20 MG tablet TAKE ONE TABLET (20MG  TOTAL) BY MOUTH TWO TIMES DAILY BEFORE A MEAL 10/02/19   Rehman, 12/02/19, MD  potassium chloride SA (K-DUR,KLOR-CON) 20 MEQ tablet Take 20 mEq by mouth every morning.     [provider]  simvastatin (ZOCOR) 20 MG tablet Take 20 mg by mouth every morning.     [provider]  sulfamethoxazole-trimethoprim (BACTRIM DS) 800-160 MG tablet Take 1 tablet by mouth 2 (two) times daily as needed (bladder infections). Take for 5 days at a time    [provider]  vitamin B-12 (CYANOCOBALAMIN) 1000 MCG tablet Take  1,000 mcg by mouth daily.    [provider]    Allergies    Penicillins  Review of Systems   Review of Systems  Constitutional: Negative for chills, diaphoresis and fever.  Respiratory: Negative for shortness of breath.   Cardiovascular: Negative for chest pain.  Gastrointestinal: Negative for abdominal pain, diarrhea, nausea and vomiting.  Genitourinary: Positive for testicular pain. Negative for difficulty urinating, discharge, flank pain, penile swelling and scrotal swelling.  Musculoskeletal: Positive for arthralgias and back pain. Negative for neck pain.  Neurological: Negative for dizziness, syncope, weakness and numbness.  All other systems reviewed and are negative.   Physical Exam Updated Vital Signs BP (!) 179/80 (BP Location: Right Arm)   Pulse 62   Temp 98.3 F (36.8 C) (Oral)   Resp 18   Ht 5\' 7"  (1.702 m)   Wt 63.5 kg   SpO2 100%   BMI 21.93 kg/m   Physical Exam Vitals and nursing note reviewed. Exam conducted with a chaperone present.  Constitutional:      General: He is not in acute distress.    Appearance: He is well-developed. He is not diaphoretic.  HENT:     Head: Normocephalic and atraumatic.     Mouth/Throat:     Mouth: Mucous membranes are moist.     Pharynx: Oropharynx is clear.  Eyes:     Conjunctiva/sclera: Conjunctivae normal.  Cardiovascular:     Rate and Rhythm: Normal rate and regular rhythm.     Pulses: Normal pulses.          Radial pulses are 2+ on the right side and 2+ on the left side.       Posterior tibial pulses are 2+ on the right side and 2+ on the left side.     Heart sounds: Normal heart sounds.     Comments: Tactile temperature in the extremities appropriate and equal bilaterally. Pulmonary:     Effort: Pulmonary effort is normal. No respiratory distress.     Breath sounds: Normal breath sounds.  Abdominal:     Palpations: Abdomen is soft.     Tenderness: There is no abdominal tenderness. There is no guarding.   Genitourinary:  Comments: Genital Exam: Penis, scrotum, and testicles without swelling, lesions, or tenderness.  No inguinal hernia noted. Cremasteric reflex intact. No inguinal lymphadenopathy.  Overall normal male genitalia.  Musculoskeletal:     Cervical back: Neck supple.       Back:     Right lower leg: No edema.     Left lower leg: No edema.     Comments: Tenderness to the posterior right lower back and buttocks.  No swelling, deformity, instability.  No tenderness to the anterior or lateral hip tenderness or pain. No other pain or tenderness to the rest of the spine, back, other extremities.  Lymphadenopathy:     Cervical: No cervical adenopathy.  Skin:    General: Skin is warm and dry.  Neurological:     Mental Status: He is alert.     Comments: Sensation to light touch grossly intact in the extremities. Strength 4/5 and equal bilaterally in the extremities, as expected in a patient of this age and muscle bulk.  Psychiatric:        Mood and Affect: Mood and affect normal.        Speech: Speech normal.        Behavior: Behavior normal.     ED Results / Procedures / Treatments   Labs (all labs ordered are listed, but only abnormal results are displayed) Labs Reviewed - No data to display  EKG None  Radiology DG Thoracic Spine 2 View  Result Date: 01/25/2020 CLINICAL DATA:  Back pain after fall. EXAM: THORACIC SPINE 2 VIEWS COMPARISON:  December 16, 2011. FINDINGS: No fracture or spondylolisthesis is noted. Mild osteophyte formation is noted at multiple levels throughout the thoracic spine. IMPRESSION: No acute abnormality seen in the thoracic spine. Multilevel degenerative disc disease is noted. Aortic Atherosclerosis (ICD10-I70.0). Electronically Signed   By: Lupita RaiderJames  Green Jr M.D.   On: 01/25/2020 14:25   DG Lumbar Spine Complete  Result Date: 01/25/2020 CLINICAL DATA:  Low back pain after fall. EXAM: LUMBAR SPINE - COMPLETE 4+ VIEW COMPARISON:  April 12, 2018.  FINDINGS: No acute fracture or spondylolisthesis is noted. Mild superior endplate depression of L2 is noted consistent with old fracture. Extensive osteophyte formation is seen at L1-2, L2-3 and L3-4. Mild osteophyte formation is noted at L4-5. There appears to be fusion of the L5-S1 disc space which may be due to degenerative change. IMPRESSION: Multilevel degenerative disc disease. No acute abnormality is noted. Aortic Atherosclerosis (ICD10-I70.0). Electronically Signed   By: Lupita RaiderJames  Green Jr M.D.   On: 01/25/2020 14:23   CT PELVIS WO CONTRAST  Result Date: 01/25/2020 CLINICAL DATA:  Multiple falls, pelvic trauma EXAM: CT PELVIS WITHOUT CONTRAST TECHNIQUE: Multidetector CT imaging of the pelvis was performed following the standard protocol without intravenous contrast. COMPARISON:  Plain films earlier today.  CT 01/17/2018 FINDINGS: Urinary Tract: Multiple bladder wall diverticula along the left bladder wall. Mildly thickened bladder wall. Ureters are decompressed. Bowel: Sigmoid diverticulosis. No active diverticulitis. Small bowel decompressed, unremarkable. Vascular/Lymphatic: Aortoiliac atherosclerosis. No aneurysm or adenopathy. Reproductive:  Mildly prominent prostate with calcifications. Other:  No free fluid or free air. Musculoskeletal: Old healed superior and inferior pubic rami fractures on the right and probable inferior pubic ramus fracture on the left. Old healed left sacral fracture. These are stable since 2019 CT. No acute fracture, subluxation or dislocation. Degenerative changes in the hips with joint space narrowing and spurring. No proximal femoral abnormality. IMPRESSION: No acute bony abnormality. Old healed pubic rami fractures bilaterally and left  sacral fracture. Sigmoid diverticulosis.  No active diverticulitis. Mild bladder wall thickening and bladder wall diverticula. Electronically Signed   By: Charlett Nose M.D.   On: 01/25/2020 16:00   US SCROTUM W/DOPPLER  Result Date:  01/25/2020 CLINICAL DATA:  Scrotal pain after fall EXAM: SCROTAL ULTRASOUND DOPPLER ULTRASOUND OF THE TESTICLES TECHNIQUE: Complete ultrasound examination of the testicles, epididymis, and other scrotal structures was performed. Color and spectral Doppler ultrasound were also utilized to evaluate blood flow to the testicles. COMPARISON:  CT 01/25/2020 FINDINGS: Right testicle Measurements: 3.9 x 2.4 x 2.7 cm. No mass or microlithiasis visualized. Left testicle Measurements: 4.0 x 2.1 x 2.0 cm. No mass or microlithiasis visualized. Right epididymis:  Normal in size and appearance. Left epididymis:  Small epididymal head cyst, 7 mm. Hydrocele:  None visualized. Varicocele:  None visualized. Pulsed Doppler interrogation of both testes demonstrates normal low resistance arterial and venous waveforms bilaterally. IMPRESSION: No testicular abnormality. Electronically Signed   By: Charlett Nose M.D.   On: 01/25/2020 16:19   DG Hip Unilat W or Wo Pelvis 2-3 Views Right  Result Date: 01/25/2020 CLINICAL DATA:  Right hip pain after fall. EXAM: DG HIP (WITH OR WITHOUT PELVIS) 2-3V RIGHT COMPARISON:  None. FINDINGS: There is no evidence of hip fracture or dislocation. No significant joint space narrowing is noted. Moderate size osteophyte is seen arising from the superior aspect of the acetabular rim. IMPRESSION: No acute abnormality seen in the right hip. Degenerative changes as described above. Electronically Signed   By: Lupita Raider M.D.   On: 01/25/2020 14:21    Procedures Procedures (including critical care time)  Medications Ordered in ED Medications - No data to display  ED Course  I have reviewed the triage vital signs and the nursing notes.  Pertinent labs & imaging results that were available during my care of the patient were reviewed by me and considered in my medical decision making (see chart for details).    MDM Rules/Calculators/A&P                          Patient presents for  evaluation following a fall that occurred 2 days ago. No focal neurologic deficits noted. Patient is nontoxic appearing, afebrile, not tachycardic, not tachypneic, not hypotensive, maintains excellent SPO2 on room air, and is in no apparent distress.   Patient did not consistently have pain with reevaluation after x-ray results were negative for acute fracture, however, he seemed to have pain more with position changes. For this reason, pelvic CT was performed. Additionally, upon his reevaluation and noted patient seemed to have pain with scooting on the bed. He states this pain is in his genital region. For this reason, scrotal ultrasound was performed as well.  I reviewed and interpreted the patient's radiological studies. There were no acute abnormalities noted on the imaging studies. He was able to ambulate prior to discharge using his walker, which he is supposed to use at baseline.  Patient urinated just prior to my evaluation of him therefore we were unable to obtain urine sample. He will follow-up with his primary care provider.  Findings and plan of care discussed with Cephus Richer, MD. Dr. Jodi Mourning personally evaluated and examined this patient.  Vitals:   01/25/20 0921 01/25/20 1218 01/25/20 1635  BP: (!) 146/104 (!) 179/80 (!) 169/90  Pulse: 83 62 63  Resp: 18 18 18   Temp: 98.3 F (36.8 C) 98.3 F (36.8 C)   TempSrc:  Oral Oral   SpO2: 94% 100% 96%  Weight: 63.5 kg    Height: 5\' 7"  (1.702 m)       Final Clinical Impression(s) / ED Diagnoses Final diagnoses:  Fall, initial encounter  Acute right-sided low back pain without sciatica    Rx / DC Orders ED Discharge Orders    None       06-27-1968 01/25/20 1951    01/27/20, PA-C 01/25/20 1953    01/27/20, MD 01/26/20 1555

## 2020-02-01 ENCOUNTER — Ambulatory Visit (INDEPENDENT_AMBULATORY_CARE_PROVIDER_SITE_OTHER): Payer: 59 | Admitting: *Deleted

## 2020-02-01 DIAGNOSIS — I442 Atrioventricular block, complete: Secondary | ICD-10-CM | POA: Diagnosis not present

## 2020-02-03 LAB — CUP PACEART REMOTE DEVICE CHECK
Battery Impedance: 1456 Ohm
Battery Remaining Longevity: 46 mo
Battery Voltage: 2.77 V
Brady Statistic AP VP Percent: 99 %
Brady Statistic AP VS Percent: 0 %
Brady Statistic AS VP Percent: 1 %
Brady Statistic AS VS Percent: 0 %
Date Time Interrogation Session: 20210902080949
Implantable Lead Implant Date: 20130730
Implantable Lead Implant Date: 20130730
Implantable Lead Location: 753859
Implantable Lead Location: 753860
Implantable Lead Model: 5076
Implantable Lead Model: 5092
Implantable Pulse Generator Implant Date: 20130730
Lead Channel Impedance Value: 505 Ohm
Lead Channel Impedance Value: 522 Ohm
Lead Channel Pacing Threshold Amplitude: 0.75 V
Lead Channel Pacing Threshold Amplitude: 1 V
Lead Channel Pacing Threshold Pulse Width: 0.4 ms
Lead Channel Pacing Threshold Pulse Width: 0.4 ms
Lead Channel Setting Pacing Amplitude: 1.625
Lead Channel Setting Pacing Amplitude: 2 V
Lead Channel Setting Pacing Pulse Width: 0.4 ms
Lead Channel Setting Sensing Sensitivity: 5.6 mV

## 2020-02-06 NOTE — Progress Notes (Signed)
Remote pacemaker transmission.   

## 2020-05-02 ENCOUNTER — Ambulatory Visit (INDEPENDENT_AMBULATORY_CARE_PROVIDER_SITE_OTHER): Payer: 59

## 2020-05-02 DIAGNOSIS — I442 Atrioventricular block, complete: Secondary | ICD-10-CM

## 2020-05-06 LAB — CUP PACEART REMOTE DEVICE CHECK
Battery Impedance: 1538 Ohm
Battery Remaining Longevity: 43 mo
Battery Voltage: 2.76 V
Brady Statistic AP VP Percent: 99 %
Brady Statistic AP VS Percent: 0 %
Brady Statistic AS VP Percent: 1 %
Brady Statistic AS VS Percent: 0 %
Date Time Interrogation Session: 20211203100656
Implantable Lead Implant Date: 20130730
Implantable Lead Implant Date: 20130730
Implantable Lead Location: 753859
Implantable Lead Location: 753860
Implantable Lead Model: 5076
Implantable Lead Model: 5092
Implantable Pulse Generator Implant Date: 20130730
Lead Channel Impedance Value: 496 Ohm
Lead Channel Impedance Value: 520 Ohm
Lead Channel Pacing Threshold Amplitude: 0.75 V
Lead Channel Pacing Threshold Amplitude: 1.125 V
Lead Channel Pacing Threshold Pulse Width: 0.4 ms
Lead Channel Pacing Threshold Pulse Width: 0.4 ms
Lead Channel Setting Pacing Amplitude: 1.625
Lead Channel Setting Pacing Amplitude: 2.25 V
Lead Channel Setting Pacing Pulse Width: 0.4 ms
Lead Channel Setting Sensing Sensitivity: 5.6 mV

## 2020-05-14 NOTE — Progress Notes (Signed)
Remote pacemaker transmission.   

## 2020-06-19 ENCOUNTER — Other Ambulatory Visit: Payer: Self-pay

## 2020-06-19 ENCOUNTER — Encounter: Payer: 59 | Admitting: Internal Medicine

## 2020-06-19 ENCOUNTER — Ambulatory Visit: Payer: Medicare PPO | Admitting: Internal Medicine

## 2020-06-19 VITALS — BP 124/62 | HR 68 | Ht 67.0 in | Wt 153.8 lb

## 2020-06-19 DIAGNOSIS — Z950 Presence of cardiac pacemaker: Secondary | ICD-10-CM | POA: Diagnosis not present

## 2020-06-19 DIAGNOSIS — I442 Atrioventricular block, complete: Secondary | ICD-10-CM | POA: Diagnosis not present

## 2020-06-19 LAB — CUP PACEART INCLINIC DEVICE CHECK
Brady Statistic RA Percent Paced: 98.8 %
Brady Statistic RV Percent Paced: 99.7 %
Date Time Interrogation Session: 20220119132724
Implantable Lead Implant Date: 20130730
Implantable Lead Implant Date: 20130730
Implantable Lead Location: 753859
Implantable Lead Location: 753860
Implantable Lead Model: 5076
Implantable Lead Model: 5092
Implantable Pulse Generator Implant Date: 20130730
Lead Channel Pacing Threshold Amplitude: 0.5 V
Lead Channel Pacing Threshold Amplitude: 1.25 V
Lead Channel Pacing Threshold Pulse Width: 0.4 ms
Lead Channel Pacing Threshold Pulse Width: 0.4 ms

## 2020-06-19 NOTE — Progress Notes (Signed)
HPI Mr. Mysliwiec returns today for followup. He is a pleasant 87 yo man with a h/o [place] Adams attacks and documented pauses of over 10 seconds. The patient also has a h/o BPH and is s/p prostatectomy with indwelling suprapubic catheter. No chest pain or sob and no syncope since his PPM was placed by Dr. Johney Frame. He does note pain in his lower extremities.He had a fall back in the summer when he abandoned his walker.   He also notes weakness in his lower extremities. He is troubled over his wife who has fairly advanced dementia. Allergies  Allergen Reactions  . Penicillins Other (See Comments)    Unknown reaction  Did it involve swelling of the face/tongue/throat, SOB, or low BP? Unknown Did it involve sudden or severe rash/hives, skin peeling, or any reaction on the inside of your mouth or nose? Unknown Did you need to seek medical attention at a hospital or doctor's office? Unknown When did it last happen?childhood allergy If all above answers are "NO", may proceed with cephalosporin use.      Current Outpatient Medications  Medication Sig Dispense Refill  . aspirin EC 81 MG tablet Take 1 tablet (81 mg total) by mouth every morning.    . bismuth-metronidazole-tetracycline (PYLERA) 140-125-125 MG capsule Take 3 capsules by mouth 4 (four) times daily -  before meals and at bedtime. 120 capsule 0  . cetirizine (ZYRTEC) 10 MG tablet Take 10 mg by mouth daily as needed for allergies.    . CONSTULOSE 10 GM/15ML solution Take 30 mLs by mouth 2 (two) times daily as needed for moderate constipation.     Marland Kitchen lisinopril (PRINIVIL,ZESTRIL) 20 MG tablet Take 20 mg by mouth 2 (two) times daily.    Marland Kitchen LORazepam (ATIVAN) 1 MG tablet Take 2 mg by mouth at bedtime.     . Melatonin 3 MG CAPS Take 3 mg by mouth at bedtime.    . metoprolol tartrate (LOPRESSOR) 50 MG tablet Take 50 mg by mouth 2 (two) times daily.    . pantoprazole (PROTONIX) 20 MG tablet TAKE ONE TABLET (20MG  TOTAL) BY MOUTH TWO  TIMES DAILY BEFORE A MEAL 90 tablet 1  . potassium chloride SA (K-DUR,KLOR-CON) 20 MEQ tablet Take 20 mEq by mouth every morning.     . propranolol (INDERAL) 20 MG tablet Take 20 mg by mouth 2 (two) times daily.    . simvastatin (ZOCOR) 20 MG tablet Take 20 mg by mouth every morning.    . sulfamethoxazole-trimethoprim (BACTRIM DS) 800-160 MG tablet Take 1 tablet by mouth 2 (two) times daily as needed (bladder infections). Take for 5 days at a time    . tamsulosin (FLOMAX) 0.4 MG CAPS capsule Take 0.4 mg by mouth daily.    . vitamin B-12 (CYANOCOBALAMIN) 1000 MCG tablet Take 1,000 mcg by mouth daily.    acetaminophen (TYLENOL) 650 MG CR tablet Take by mouth.     No current facility-administered medications for this visit.     Past Medical History:  Diagnosis Date  . Arthritis   . CAD (coronary artery disease)    a.  90% LAD stenosis in 1995 treated with PTCA;  b. 09/2009 CABG x 4: LIMA->LAD, VG->Diag, VG->OM, VG->PDA;  c. 11/2011 Cath: 3VD, 4/4 patent grafts, EF 70%.  . Chronic constipation   . Colonic polyp   . Complete heart block (HCC)    a. 11/2011 s/p MDT Adapta L ADDRL 1 Ser # [id number] H.  . Complication of  anesthesia    PROBLEMS VOIDING AFTER PREVIOUS SURGERIES  . Diabetes mellitus without complication (HCC)    NOT ON ANY DIABETIC MEDICATIONS  . Fasting hyperglycemia   . GERD (gastroesophageal reflux disease)   . Hyperlipidemia   . Hypertension   . Pacemaker JULY 2013   DR.  G.Chandan Fly AND DR. Tommie Sams  . Pseudophakia of both eyes   . Seizures (HCC)    a. prior to diagnosis of heart block and syncope, this was in the differential and pt was briefly on Keppra, started by ER MD.  . Syncope    a. 11/2011 18 second pauses noted on Event Monitor assoc w/ syncope  . Trauma 1952   Abdominal and chest in 1952; multiple injuries including pelvic fracture, rib fractures and ruptured bladder  . Urinary retention    PT HAS INDWELLING FOLEY CATHETER    ROS:   All systems reviewed  and negative except as noted in the HPI.   Past Surgical History:  Procedure Laterality Date  . BIOPSY  07/05/2019   Procedure: BIOPSY;  Surgeon: Malissa Hippo, MD;  [facility];  Service: Endoscopy;;  gastric  . BLADDER SURGERY  1950'S  . CATARACT EXTRACTION W/PHACO Right 11/22/2012   Procedure: CATARACT EXTRACTION PHACO AND INTRAOCULAR LENS PLACEMENT (IOC);  Surgeon: Loraine Leriche T. Nile Riggs, MD;  Location: [facility];  Service: Ophthalmology;  Laterality: Right;  CDE 10.27  . CATARACT EXTRACTION W/PHACO Left 12/20/2012   Procedure: CATARACT EXTRACTION PHACO AND INTRAOCULAR LENS PLACEMENT (IOC);  Surgeon: Loraine Leriche T. Nile Riggs, MD;  Location: [facility];  Service: Ophthalmology;  Laterality: Left;  CDE:6.67  . COLONOSCOPY W/ POLYPECTOMY    . CORONARY ARTERY BYPASS GRAFT  09/2009   4 vessels  . ESOPHAGEAL DILATION  07/05/2019   Procedure: ESOPHAGEAL DILATION;  Surgeon: Malissa Hippo, MD;  [facility];  Service: Endoscopy;;  . ESOPHAGOGASTRODUODENOSCOPY (EGD) WITH PROPOFOL N/A 07/05/2019   Procedure: ESOPHAGOGASTRODUODENOSCOPY (EGD) WITH PROPOFOL;  Surgeon: Malissa Hippo, MD;  [facility];  Service: Endoscopy;  Laterality: N/A;  110-office notified pt new arrival time 10:45am  . FEMORAL HERNIA REPAIR    . INSERTION OF SUPRAPUBIC CATHETER  04/04/2012   Procedure: INSERTION OF SUPRAPUBIC CATHETER;  Surgeon: Garnett Farm, MD;  Location: [facility];  Service: Urology;  Laterality: N/A;  . LEAD REVISION N/A 12/30/2011   Procedure: LEAD REVISION;  Surgeon: Marinus Maw, MD;  Location: [facility] CATH LAB;  Service: Cardiovascular;  Laterality: N/A;  . LEFT HEART CATHETERIZATION WITH CORONARY/GRAFT ANGIOGRAM  12/29/2011   Procedure: LEFT HEART CATHETERIZATION WITH Isabel Caprice;  Surgeon: [name], MD;  Location: [facility] C[facility] LAB;  Service: Cardiovascular;;  . PACEMAKER PLACEMENT    . TEMPORARY PACEMAKER INSERTION N/A 12/29/2011   Procedure: TEMPORARY PACEMAKER INSERTION;  Surgeon:  [name], MD;  Location: [facility] CATH LAB;  Service: Cardiovascular;  Laterality: N/A;  . TRANSURETHRAL PROSTATECTOMY WITH GYRUS INSTRUMENTS  04/04/2012   Procedure: TRANSURETHRAL PROSTATECTOMY WITH GYRUS INSTRUMENTS;  Surgeon: Garnett Farm, MD;  Location: [facility];  Service: Urology;  Laterality: N/A;  TURP with Gyrus and Suprapubic Tube Placement  . YAG LASER APPLICATION Left 06/25/2015   Procedure: YAG LASER APPLICATION;  Surgeon: Jethro Bolus, MD;  Location: [facility];  Service: Ophthalmology;  Laterality: Left;     Family History  Problem Relation Age of Onset  . Heart block Mother   . Heart murmur Mother   . Aneurysm Father   . Coronary artery disease Brother  x3  . Lung cancer Brother      Social History   Socioeconomic History  . Marital status: Married    Spouse name: Not on file  . Number of children: Not on file  . Years of education: Not on file  . Highest education level: Not on file  Occupational History  . Occupation: Retired  Tobacco Use  . Smoking status: Never Smoker  . Smokeless tobacco: Current User    Types: Chew  Vaping Use  . Vaping Use: Never used  Substance and Sexual Activity  . Alcohol use: No  . Drug use: No  . Sexual activity: Yes  Other Topics Concern  . Not on file  Social History Narrative  . Not on file   Social Determinants of Health   Financial Resource Strain: Not on file  Food Insecurity: Not on file  Transportation Needs: Not on file  Physical Activity: Not on file  Stress: Not on file  Social Connections: Not on file  Intimate Partner Violence: Not on file     BP 124/62   Pulse 68   Ht 5\' 7"  (1.702 m)   Wt 153 lb 12.8 oz (69.8 kg)   SpO2 94%   BMI 24.09 kg/m   Physical Exam:  Well appearing elderly man with a tremor, NAD HEENT: Unremarkable Neck:  6 cm JVD, no thyromegally Lymphatics:  No adenopathy Back:  No CVA tenderness Lungs:  Clear with no wheezes HEART:  Regular rate rhythm, no murmurs, no rubs, no  clicks Abd:  soft, positive bowel sounds, no organomegally, no rebound, no guarding Ext:  2 plus pulses, no edema, no cyanosis, no clubbing Skin:  No rashes no nodules Neuro:  CN II through XII intact, motor grossly intact  EKG - NSR with AV pacing  DEVICE  Normal device function.  See PaceArt for details.   Assess/Plan:  1. CHB - he is asymptomatic, s/p PPM insertion. 2. Sinus node dysfunction - he has no escape today. He is asymptomatic. 3. PPM - his medtronic DDD PM is working normally. We will recheck in several months 4. Chest pain - his symptoms are related to acid reflux and have resolved. We discussed the use of maalox and acid suppression.  Ronie Fleeger,MD

## 2020-06-19 NOTE — Patient Instructions (Signed)
Medication Instructions:  Your physician recommends that you continue on your current medications as directed. Please refer to the Current Medication list given to you today.  *If you need a refill on your cardiac medications before your next appointment, please call your pharmacy*   Lab Work: NONE   If you have labs (blood work) drawn today and your tests are completely normal, you will receive your results only by: . MyChart Message (if you have MyChart) OR . A paper copy in the mail If you have any lab test that is abnormal or we need to change your treatment, we will call you to review the results.   Testing/Procedures: NONE    Follow-Up: At [facility], you and your health needs are our priority.  As part of our continuing mission to provide you with exceptional heart care, we have created designated Provider Care Teams.  These Care Teams include your primary Cardiologist (physician) and Advanced Practice Providers (APPs -  Physician Assistants and Nurse Practitioners) who all work together to provide you with the care you need, when you need it.  We recommend signing up for the patient portal called "MyChart".  Sign up information is provided on this After Visit Summary.  MyChart is used to connect with patients for Virtual Visits (Telemedicine).  Patients are able to view lab/test results, encounter notes, upcoming appointments, etc.  Non-urgent messages can be sent to your provider as well.   To learn more about what you can do with MyChart, go to [email or web address].    Your next appointment:   1 year(s)  The format for your next appointment:   In Person  Provider:   Gregg Taylor, MD   Other Instructions Thank you for choosing [facility]!    

## 2020-06-19 NOTE — Addendum Note (Signed)
Addended by: Kerney Elbe on: 06/19/2020 04:46 PM   Modules accepted: Orders

## 2020-07-02 ENCOUNTER — Encounter: Payer: 59 | Admitting: Internal Medicine

## 2020-10-31 ENCOUNTER — Ambulatory Visit (INDEPENDENT_AMBULATORY_CARE_PROVIDER_SITE_OTHER): Payer: 59

## 2020-10-31 DIAGNOSIS — I442 Atrioventricular block, complete: Secondary | ICD-10-CM | POA: Diagnosis not present

## 2020-11-02 LAB — CUP PACEART REMOTE DEVICE CHECK
Battery Impedance: 1830 Ohm
Battery Remaining Longevity: 37 mo
Battery Voltage: 2.76 V
Brady Statistic AP VP Percent: 99 %
Brady Statistic AP VS Percent: 0 %
Brady Statistic AS VP Percent: 1 %
Brady Statistic AS VS Percent: 0 %
Date Time Interrogation Session: 20220603141448
Implantable Lead Implant Date: 20130730
Implantable Lead Implant Date: 20130730
Implantable Lead Location: 753859
Implantable Lead Location: 753860
Implantable Lead Model: 5076
Implantable Lead Model: 5092
Implantable Pulse Generator Implant Date: 20130730
Lead Channel Impedance Value: 502 Ohm
Lead Channel Impedance Value: 534 Ohm
Lead Channel Pacing Threshold Amplitude: 0.875 V
Lead Channel Pacing Threshold Amplitude: 1.125 V
Lead Channel Pacing Threshold Pulse Width: 0.4 ms
Lead Channel Pacing Threshold Pulse Width: 0.4 ms
Lead Channel Setting Pacing Amplitude: 1.75 V
Lead Channel Setting Pacing Amplitude: 2.25 V
Lead Channel Setting Pacing Pulse Width: 0.4 ms
Lead Channel Setting Sensing Sensitivity: 5.6 mV

## 2020-11-20 ENCOUNTER — Other Ambulatory Visit: Payer: Self-pay

## 2020-11-20 ENCOUNTER — Emergency Department (HOSPITAL_COMMUNITY)
Admission: EM | Admit: 2020-11-20 | Discharge: 2020-11-20 | Disposition: A | Discharge status: Home / Self Care | Payer: Medicare PPO | Attending: Emergency Medicine | Admitting: Emergency Medicine

## 2020-11-20 ENCOUNTER — Encounter (HOSPITAL_COMMUNITY): Payer: Self-pay

## 2020-11-20 DIAGNOSIS — E119 Type 2 diabetes mellitus without complications: Secondary | ICD-10-CM | POA: Diagnosis not present

## 2020-11-20 DIAGNOSIS — Z950 Presence of cardiac pacemaker: Secondary | ICD-10-CM | POA: Insufficient documentation

## 2020-11-20 DIAGNOSIS — Z79899 Other long term (current) drug therapy: Secondary | ICD-10-CM | POA: Diagnosis not present

## 2020-11-20 DIAGNOSIS — S0991XA Unspecified injury of ear, initial encounter: Secondary | ICD-10-CM | POA: Diagnosis present

## 2020-11-20 DIAGNOSIS — S00412A Abrasion of left ear, initial encounter: Secondary | ICD-10-CM | POA: Insufficient documentation

## 2020-11-20 DIAGNOSIS — I10 Essential (primary) hypertension: Secondary | ICD-10-CM | POA: Insufficient documentation

## 2020-11-20 DIAGNOSIS — X58XXXA Exposure to other specified factors, initial encounter: Secondary | ICD-10-CM | POA: Insufficient documentation

## 2020-11-20 DIAGNOSIS — I2510 Atherosclerotic heart disease of native coronary artery without angina pectoris: Secondary | ICD-10-CM | POA: Diagnosis not present

## 2020-11-20 DIAGNOSIS — Z7982 Long term (current) use of aspirin: Secondary | ICD-10-CM | POA: Diagnosis not present

## 2020-11-20 DIAGNOSIS — F17290 Nicotine dependence, other tobacco product, uncomplicated: Secondary | ICD-10-CM | POA: Insufficient documentation

## 2020-11-20 DIAGNOSIS — Z951 Presence of aortocoronary bypass graft: Secondary | ICD-10-CM | POA: Diagnosis not present

## 2020-11-20 LAB — CBC
HCT: 46.8 % (ref 39.0–52.0)
Hemoglobin: 15.9 g/dL (ref 13.0–17.0)
MCH: 31.4 pg (ref 26.0–34.0)
MCHC: 34 g/dL (ref 30.0–36.0)
MCV: 92.5 fL (ref 80.0–100.0)
Platelets: 188 10*3/uL (ref 150–400)
RBC: 5.06 MIL/uL (ref 4.22–5.81)
RDW: 12.9 % (ref 11.5–15.5)
WBC: 9.6 10*3/uL (ref 4.0–10.5)
nRBC: 0 % (ref 0.0–0.2)

## 2020-11-20 MED ORDER — SILVER NITRATE-POT NITRATE 75-25 % EX MISC
1.0000 | Freq: Once | CUTANEOUS | Status: AC
  Administered 2020-11-20: 1 via TOPICAL

## 2020-11-20 MED ORDER — TRANEXAMIC ACID FOR EPISTAXIS
500.0000 mg | Freq: Once | TOPICAL | Status: AC
  Administered 2020-11-20: 500 mg via TOPICAL
  Filled 2020-11-20: qty 10

## 2020-11-20 NOTE — Discharge Instructions (Addendum)
Please follow-up with your primary care doctor in the next couple of days.  If this starts to rebleed then please come back here.  It is important that you do not touch this area or accidentally bumped into anything over the next couple of days.  Please follow-up with your dermatologist that did this procedure as well.  If you have any new or concerning symptom please come back to the emergency department.  Your blood pressure was also elevated today in the emergency department, have this rechecked with your primary care doctor as well.

## 2020-11-20 NOTE — ED Provider Notes (Signed)
[facility] EMERGENCY DEPARTMENT Provider Note   CSN: [id number] Arrival date & time: 11/20/20  1834     History Chief Complaint  Patient presents with   Ear Injury    Guy Roberson is a 87 y.o. male with pertinent past medical history of CAD, diabetes, hyperlipidemia, hypertension, pacemaker that presents the emerge department today for ear bleeding.  Patient states that he had an ear biopsy done with dermatology on Monday, had a couple spots removed, states that on Monday it was profusely bleeding after surgery, was able to hold pressure and it was able to be resolved.  States that its been fine since Monday, however today started bleeding again.  No recent injury to this area.  Denies any internal ear pain or hearing loss.  Family, granddaughter is with patient and states that she has been holding pressure for an hour with no relief.  Patient is on any blood thinners besides aspirin.  States that they called the dermatology office, however they have not responded to patient since Monday. No other complaints.   HPI     Past Medical History:  Diagnosis Date   Arthritis    CAD (coronary artery disease)    a.  90% LAD stenosis in 1995 treated with PTCA;  b. 09/2009 CABG x 4: LIMA->LAD, VG->Diag, VG->OM, VG->PDA;  c. 11/2011 Cath: 3VD, 4/4 patent grafts, EF 70%.   Chronic constipation    Colonic polyp    Complete heart block (HCC)    a. 11/2011 s/p MDT Adapta L ADDRL 1 Ser # [id number] H.   Complication of anesthesia    PROBLEMS VOIDING AFTER PREVIOUS SURGERIES   Diabetes mellitus without complication (HCC)    NOT ON ANY DIABETIC MEDICATIONS   Fasting hyperglycemia    GERD (gastroesophageal reflux disease)    Hyperlipidemia    Hypertension    Pacemaker JULY 2013   DR.  G.TAYLOR AND DR. Tommie Sams   Pseudophakia of both eyes    Seizures (HCC)    a. prior to diagnosis of heart block and syncope, this was in the differential and pt was briefly on Keppra, started by ER MD.   Syncope     a. 11/2011 18 second pauses noted on Event Monitor assoc w/ syncope   Trauma 1952   Abdominal and chest in 1952; multiple injuries including pelvic fracture, rib fractures and ruptured bladder   Urinary retention    PT HAS INDWELLING FOLEY CATHETER    Patient Active Problem List   Diagnosis Date Noted   Urinary retention 01/01/2012    Class: Present on Admission   Pacemaker-Medtronic 12/30/2011   Acute respiratory failure (HCC) 12/29/2011   Rib fracture 12/29/2011   Hypoxemia 12/29/2011   Syncope    Complete heart block (HCC)    Hypertension    Hyperlipidemia    CAD (coronary artery disease)    Arteriosclerotic cardiovascular disease (ASCVD)    Fasting hyperglycemia    Colonic polyp    DIABETES MELLITUS, TYPE II 10/02/2009   HYPERLIPIDEMIA 09/30/2009   Essential hypertension 09/30/2009    Past Surgical History:  Procedure Laterality Date   BIOPSY  07/05/2019   Procedure: BIOPSY;  Surgeon: Malissa Hippo, MD;  [facility];  Service: Endoscopy;;  gastric   BLADDER SURGERY  1950'S   CATARACT EXTRACTION W/PHACO Right 11/22/2012   Procedure: CATARACT EXTRACTION PHACO AND INTRAOCULAR LENS PLACEMENT (IOC);  Surgeon: Loraine Leriche T. Nile Riggs, MD;  Location: [facility];  Service: Ophthalmology;  Laterality: Right;  CDE  10.27   CATARACT EXTRACTION W/PHACO Left 12/20/2012   Procedure: CATARACT EXTRACTION PHACO AND INTRAOCULAR LENS PLACEMENT (IOC);  Surgeon: Loraine Leriche T. Nile Riggs, MD;  Location: [facility];  Service: Ophthalmology;  Laterality: Left;  CDE:6.67   COLONOSCOPY W/ POLYPECTOMY     CORONARY ARTERY BYPASS GRAFT  09/2009   4 vessels   ESOPHAGEAL DILATION  07/05/2019   Procedure: ESOPHAGEAL DILATION;  Surgeon: Malissa Hippo, MD;  [facility];  Service: Endoscopy;;   ESOPHAGOGASTRODUODENOSCOPY (EGD) WITH PROPOFOL N/A 07/05/2019   Procedure: ESOPHAGOGASTRODUODENOSCOPY (EGD) WITH PROPOFOL;  Surgeon: Malissa Hippo, MD;  [facility];  Service: Endoscopy;  Laterality: N/A;   110-office notified pt new arrival time 10:45am   FEMORAL HERNIA REPAIR     INSERTION OF SUPRAPUBIC CATHETER  04/04/2012   Procedure: INSERTION OF SUPRAPUBIC CATHETER;  Surgeon: Garnett Farm, MD;  Location: [facility];  Service: Urology;  Laterality: N/A;   LEAD REVISION N/A 12/30/2011   Procedure: LEAD REVISION;  Surgeon: Marinus Maw, MD;  Location: [facility] CATH LAB;  Service: Cardiovascular;  Laterality: N/A;   LEFT HEART CATHETERIZATION WITH CORONARY/GRAFT ANGIOGRAM  12/29/2011   Procedure: LEFT HEART CATHETERIZATION WITH Isabel Caprice;  Surgeon: [name], MD;  Location: [facility] CATH LAB;  Service: Cardiovascular;;   PACEMAKER PLACEMENT     TEMPORARY PACEMAKER INSERTION N/A 12/29/2011   Procedure: TEMPORARY PACEMAKER INSERTION;  Surgeon: [name], MD;  Location: [facility] CATH LAB;  Service: Cardiovascular;  Laterality: N/A;   TRANSURETHRAL PROSTATECTOMY WITH GYRUS INSTRUMENTS  04/04/2012   Procedure: TRANSURETHRAL PROSTATECTOMY WITH GYRUS INSTRUMENTS;  Surgeon: Garnett Farm, MD;  Location: [facility];  Service: Urology;  Laterality: N/A;  TURP with Gyrus and Suprapubic Tube Placement   YAG LASER APPLICATION Left 06/25/2015   Procedure: YAG LASER APPLICATION;  Surgeon: Jethro Bolus, MD;  Location: [facility];  Service: Ophthalmology;  Laterality: Left;       Family History  Problem Relation Age of Onset   Heart block Mother    Heart murmur Mother    Aneurysm Father    Coronary artery disease Brother        x3   Lung cancer Brother     Social History   Tobacco Use   Smoking status: Never   Smokeless tobacco: Current    Types: Chew  Vaping Use   Vaping Use: Never used  Substance Use Topics   Alcohol use: No   Drug use: No    Home Medications Prior to Admission medications   Medication Sig Start Date End Date Taking? Authorizing Provider  acetaminophen (TYLENOL) 650 MG CR tablet Take 650 mg by mouth every 8 (eight) hours as needed for pain or fever. 02/02/20  Yes [provider]  aspirin EC 81 MG tablet Take 1 tablet (81 mg total) by mouth every morning. 07/06/19  Yes Rehman, Joline Maxcy, MD  bismuth-metronidazole-tetracycline [facility]) [phone] MG capsule Take 3 capsules by mouth 4 (four) times daily -  before meals and at bedtime. 07/08/19  Yes Rehman, Joline Maxcy, MD  cetirizine (ZYRTEC) 10 MG tablet Take 10 mg by mouth daily as needed for allergies.   Yes [provider]  CONSTULOSE 10 GM/15ML solution Take 30 mLs by mouth 2 (two) times daily as needed for moderate constipation.  07/27/18  Yes [provider]  hydrochlorothiazide (HYDRODIURIL) 25 MG tablet Take 1 tablet by mouth every morning. 10/21/20  Yes [provider]  lisinopril (PRINIVIL,ZESTRIL) 20 MG tablet Take 20 mg by  mouth 2 (two) times daily.   Yes [provider]  LORazepam (ATIVAN) 1 MG tablet Take 2 mg by mouth at bedtime.    Yes [provider]  Melatonin 3 MG CAPS Take 3 mg by mouth at bedtime.   Yes [provider]  metoprolol tartrate (LOPRESSOR) 50 MG tablet Take 50 mg by mouth 2 (two) times daily.   Yes [provider]  pantoprazole (PROTONIX) 20 MG tablet TAKE ONE TABLET (20MG  TOTAL) BY MOUTH TWO TIMES DAILY BEFORE A MEAL 10/02/19  Yes Rehman, 12/02/19, MD  potassium chloride SA (K-DUR,KLOR-CON) 20 MEQ tablet Take 20 mEq by mouth every morning.    Yes [provider]  propranolol (INDERAL) 20 MG tablet Take 20 mg by mouth 2 (two) times daily. 06/10/20  Yes [provider]  simvastatin (ZOCOR) 20 MG tablet Take 20 mg by mouth every morning.   Yes [provider]  sulfamethoxazole-trimethoprim (BACTRIM DS) 800-160 MG tablet Take 1 tablet by mouth 2 (two) times daily as needed (bladder infections). Take for 5 days at a time   Yes [provider]  tamsulosin (FLOMAX) 0.4 MG CAPS capsule Take 0.4 mg by mouth daily. 06/07/20  Yes [provider]  vitamin B-12 (CYANOCOBALAMIN) 1000 MCG tablet Take  1,000 mcg by mouth daily.   Yes [provider]    Allergies    Penicillins  Review of Systems   Review of Systems  Constitutional:  Negative for chills, diaphoresis, fatigue and fever.  HENT:  Negative for congestion, sore throat and trouble swallowing.   Eyes:  Negative for pain and visual disturbance.  Respiratory:  Negative for cough, shortness of breath and wheezing.   Cardiovascular:  Negative for chest pain, palpitations and leg swelling.  Gastrointestinal:  Negative for abdominal distention, abdominal pain, diarrhea, nausea and vomiting.  Genitourinary:  Negative for difficulty urinating.  Musculoskeletal:  Negative for back pain, neck pain and neck stiffness.  Skin:  Positive for wound. Negative for pallor.  Neurological:  Negative for dizziness, speech difficulty, weakness and headaches.  Psychiatric/Behavioral:  Negative for confusion.    Physical Exam Updated Vital Signs BP (!) 153/86 (BP Location: Right Arm)   Pulse 67   Temp 98 F (36.7 C) (Oral)   Resp 18   Ht 5\' 7"  (1.702 m)   Wt 69.8 kg   SpO2 100%   BMI 24.10 kg/m   Physical Exam Constitutional:      General: He is not in acute distress.    Appearance: Normal appearance. He is not ill-appearing, toxic-appearing or diaphoretic.  HENT:     Ears:      Comments: Left ear with top of external ear, anterior part of helix with lesion from procedure.  Area is bleeding, no pulsatile blood.  Internal ear exam normal.    Mouth/Throat:     Mouth: Mucous membranes are moist.     Pharynx: Oropharynx is clear.  Eyes:     General: No scleral icterus.    Extraocular Movements: Extraocular movements intact.     Pupils: Pupils are equal, round, and reactive to light.  Cardiovascular:     Rate and Rhythm: Normal rate and regular rhythm.     Pulses: Normal pulses.     Heart sounds: Normal heart sounds.  Pulmonary:     Effort: Pulmonary effort is normal. No respiratory distress.     Breath sounds: Normal  breath sounds.  Musculoskeletal:     Cervical back: Normal range of motion  and neck supple. No rigidity.  Skin:    General: Skin is warm and dry.     Capillary Refill: Capillary refill takes less than 2 seconds.     Coloration: Skin is not pale.  Neurological:     General: No focal deficit present.     Mental Status: He is alert and oriented to person, place, and time.  Psychiatric:        Mood and Affect: Mood normal.        Behavior: Behavior normal.        Thought Content: Thought content normal.    ED Results / Procedures / Treatments   Labs (all labs ordered are listed, but only abnormal results are displayed) Labs Reviewed  CBC    EKG None  Radiology No results found.  Procedures Procedures   Medications Ordered in ED Medications  tranexamic acid (CYKLOKAPRON) 1000 MG/10ML topical solution 500 mg (500 mg Topical Given 11/20/20 1943)  silver nitrate applicators applicator 1 Stick (1 Stick Topical Given 11/20/20 1943)    ED Course  I have reviewed the triage vital signs and the nursing notes.  Pertinent labs & imaging results that were available during my care of the patient were reviewed by me and considered in my medical decision making (see chart for details).    MDM Rules/Calculators/A&P                          Ledell NossBobby W Braunschweig is a 87 y.o. male with pertinent past medical history of CAD, diabetes, hyperlipidemia, hypertension, pacemaker that presents the emerge department today for ear bleeding after biopsy.  Biopsy was done on Monday, did bleed on Monday and then stopped until today.  Patient most likely bumped into something to restart bleeding.  Patient not any blood thinners.  Did attempt direct pressure for 20 minutes, this did not help.  Then attempted TXA soaked in gauze with direct pressure, this also did not help.  I then used silver nitrate after discussion with patient, this did stop the bleeding. Normal internal ear exam.    Upon recheck after 30  minutes, patient is bleeding has completely resolved.  Patient will follow up with PCP.  Hemoglobin stable.  Patient denies any symptoms at this time, denies any dizziness or fatigue.  Patient asking to be discharged at this time.  Nursing was able to wrap this area up.  Doubt need for further emergent work up at this time. I explained the diagnosis and have given explicit precautions to return to the ER including for any other new or worsening symptoms. The patient understands and accepts the medical plan as it's been dictated and I have answered their questions. Discharge instructions concerning home care and prescriptions have been given. The patient is STABLE and is discharged to home in good condition.  I discussed this case with my attending physician who cosigned this note including patient's presenting symptoms, physical exam, and planned diagnostics and interventions. Attending physician stated agreement with plan or made changes to plan which were implemented.       Final Clinical Impression(s) / ED Diagnoses Final diagnoses:  Injury of ear, initial encounter    Rx / DC Orders ED Discharge Orders     None        Farrel Gordonatel, Taesean Reth, Cordelia Poche-C 11/20/20 2109    Cheryll CockayneHong, Joshua S, MD 11/29/20 2342

## 2020-11-20 NOTE — ED Notes (Signed)
Pt cleanup up. Placed in paper scrub top. Ear dressed and secured with tape. No bleeding noted at this time.

## 2020-11-20 NOTE — ED Triage Notes (Signed)
Had biopsy done on L ear on Monday, had a couple of spots removed. Today, started bleeding again. Family reports holding pressure for an hour with no relief.

## 2020-11-25 NOTE — Progress Notes (Signed)
Remote pacemaker transmission.   

## 2021-01-01 IMAGING — CT CT ABD-PELV W/ CM
2 of 5 series · 15 of 46 positions shown, 17 images · IV contrast (omnipaque)
Comparison: None.

CLINICAL DATA: Generalized abdominal pain. Difficulty swallowing.
Dysphagia. Persistent hiccups.

EXAM:
CT ABDOMEN AND PELVIS WITH CONTRAST
TECHNIQUE: Multidetector CT imaging of the abdomen and pelvis was performed
using the standard protocol following bolus administration of
intravenous contrast.
CONTRAST:  100mL OMNIPAQUE IOHEXOL 300 MG/ML  SOLN

[Series 3: axial st · axial · 0.93mm/px · z∈[+945,+1400]mm · 12 of 105 slices shown, 14 images]
[im 7/105  soft-tissue]
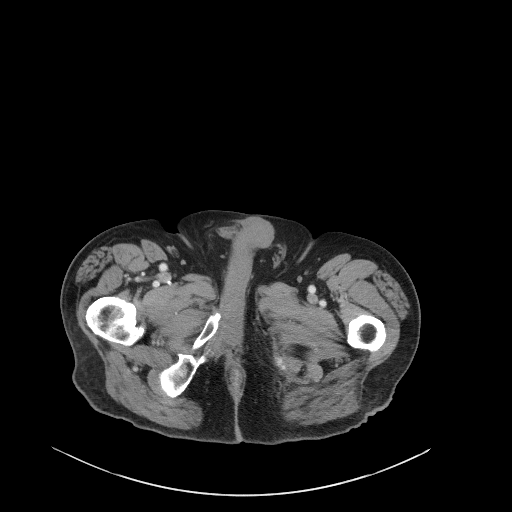
[im 7/105  bone]
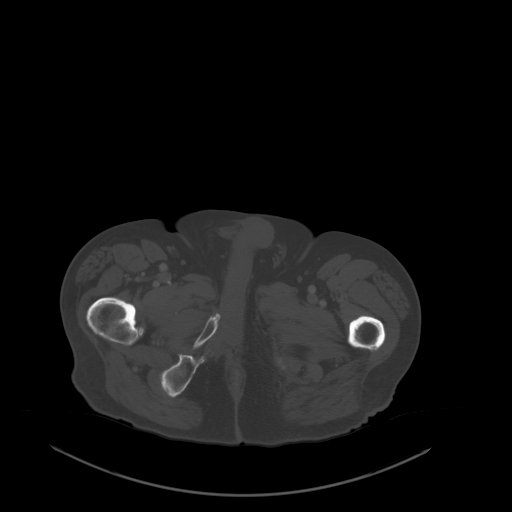
[im 14/105  soft-tissue]
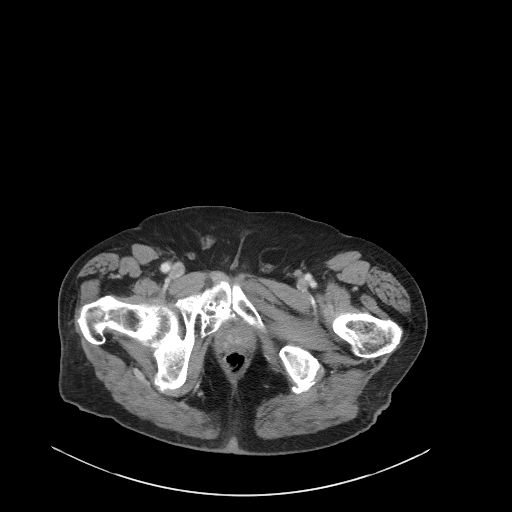
[im 21/105  soft-tissue]
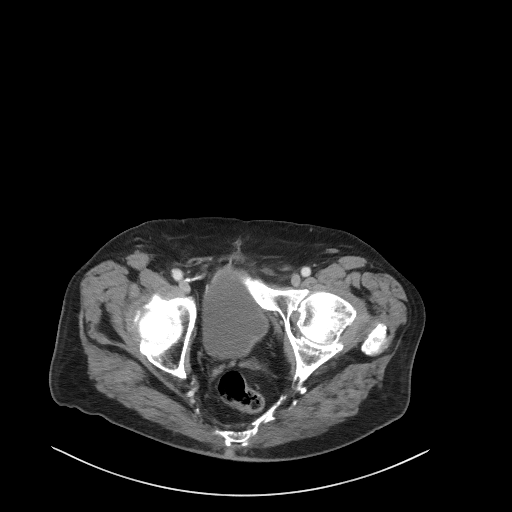
[im 35/105  soft-tissue]
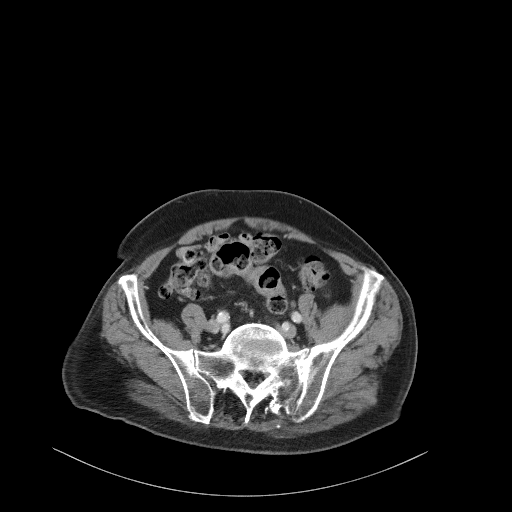
[im 42/105  soft-tissue]
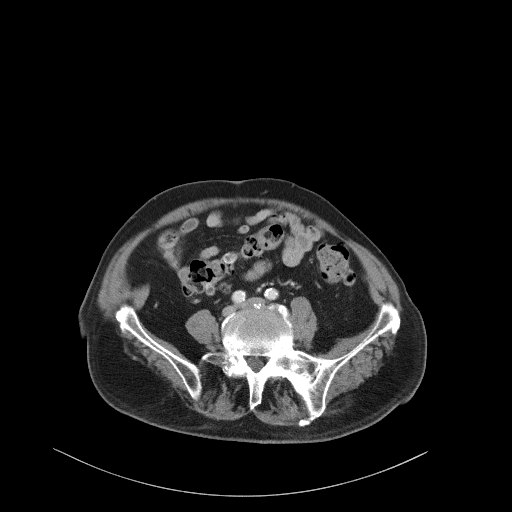
[im 49/105  soft-tissue]
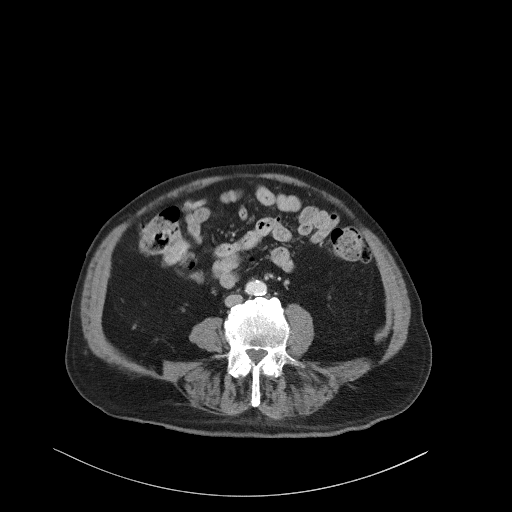
[im 56/105  soft-tissue]
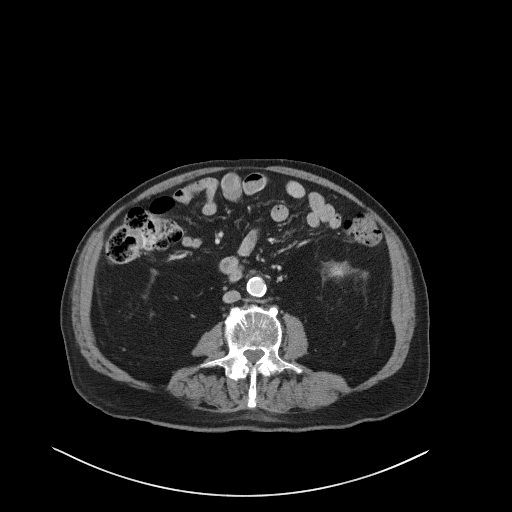
[im 63/105  soft-tissue]
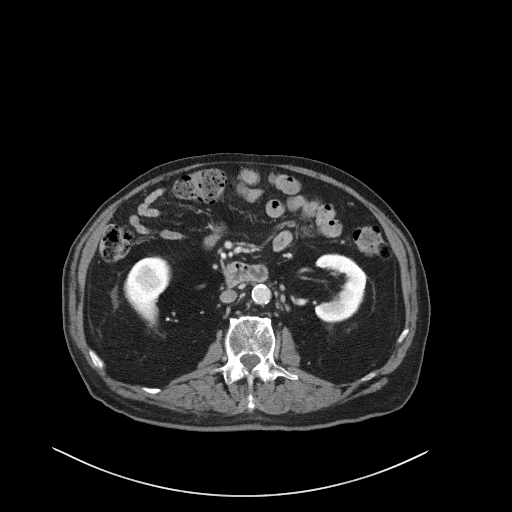
[im 70/105  soft-tissue]
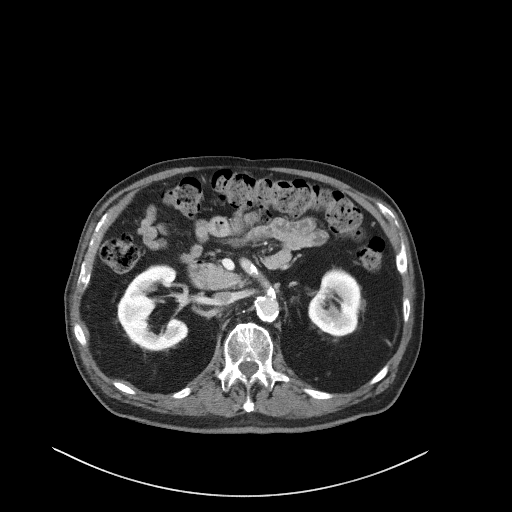
[im 70/105  bone]
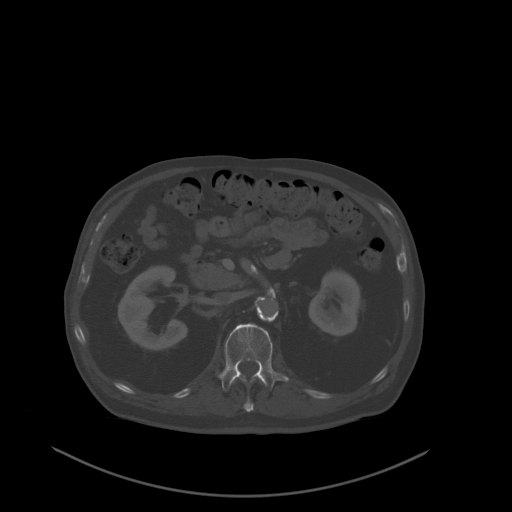
[im 84/105  soft-tissue]
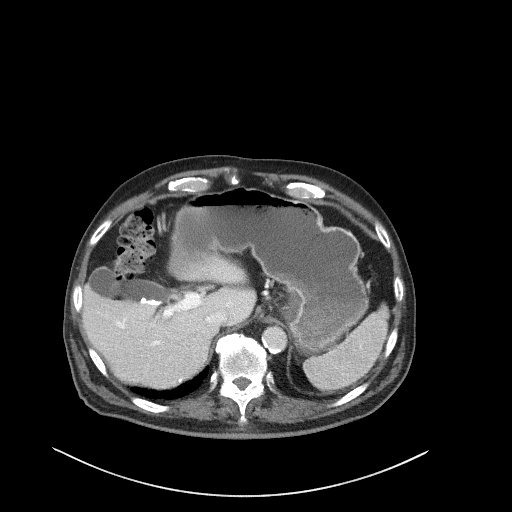
[im 91/105  soft-tissue]
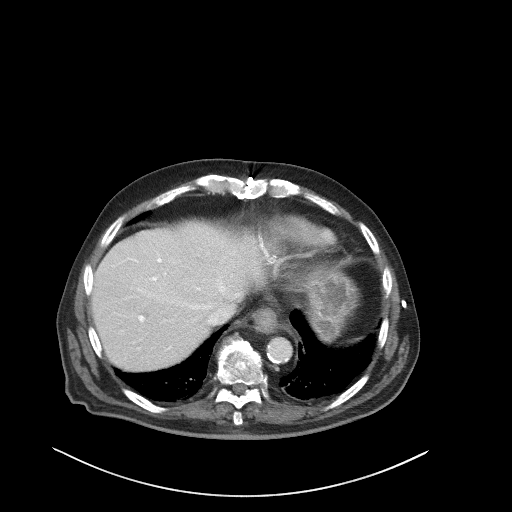
[im 98/105  soft-tissue]
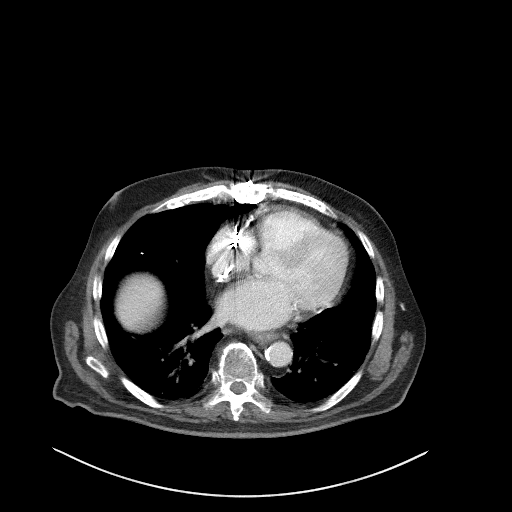

[Series 7: coronal st · coronal · 0.74mm/px · 3 of 109 slices shown]
[im 37/109  soft-tissue]
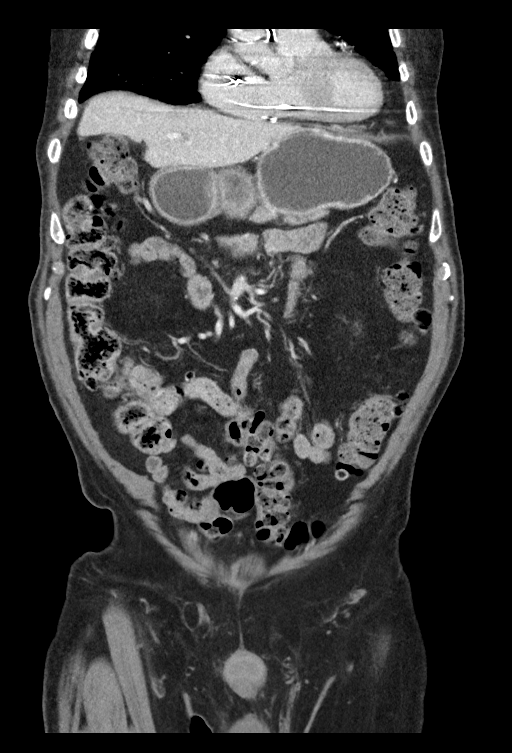
[im 49/109  soft-tissue]
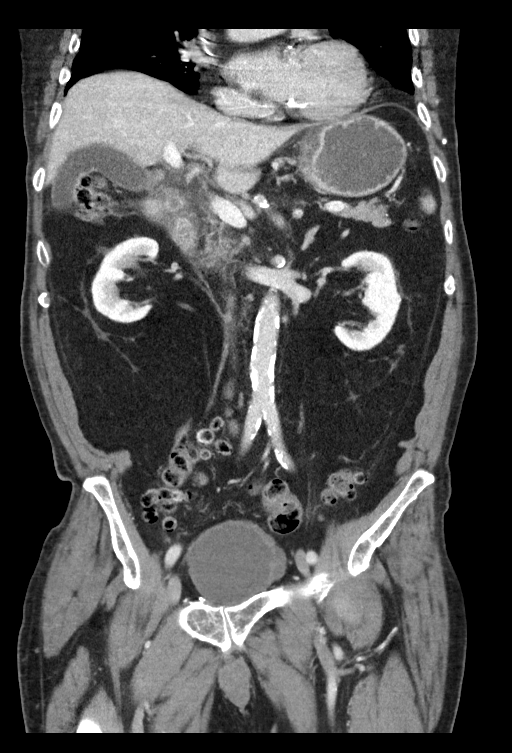
[im 61/109  soft-tissue]
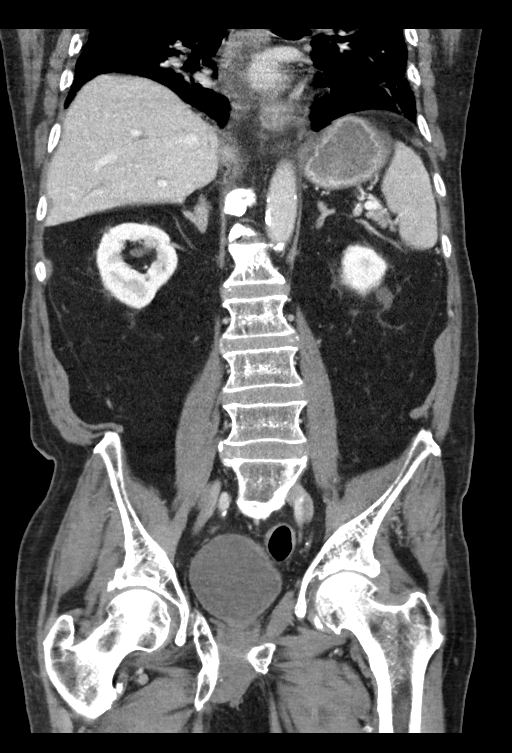

[15 of 46 positions shown; findings below may reference images not displayed]

FINDINGS: Lower chest: Mild apparent wall thickening in the distal esophagus.
No confluent opacities or effusions.

Hepatobiliary: Layering gallstones within the gallbladder. No focal
hepatic abnormality.

Pancreas: No focal abnormality or ductal dilatation.

Spleen: No focal abnormality.  Normal size.

Adrenals/Urinary Tract: No adrenal abnormality. No focal renal
abnormality. No stones or hydronephrosis. Urinary bladder is
trabeculated.

Stomach/Bowel: Colonic diverticulosis. No active diverticulitis.
Stomach is fluid-filled. There is mild apparent wall thickening in
the distal esophagus and at the GE junction and proximal stomacha.
This may reflect esophagitis/gastritis, less likely infiltrating
mass. Small bowel decompressed.

Vascular/Lymphatic: Aortic atherosclerosis. No enlarged abdominal or
pelvic lymph nodes.

Reproductive: No visible focal abnormality.

Other: No free fluid or free air.

Musculoskeletal: No acute bony abnormality.
IMPRESSION: Mild wall thickening in the distal esophagus and proximal stomach/GE
junction region. This may reflect esophagitis/gastritis, less likely
infiltrating mass. Consider further evaluation with direct
visualization with endoscopy.

Fluid-filled stomach.

Cholelithiasis.

Colonic diverticulosis.

Aortic atherosclerosis.

## 2021-01-30 ENCOUNTER — Ambulatory Visit (INDEPENDENT_AMBULATORY_CARE_PROVIDER_SITE_OTHER): Payer: 59

## 2021-01-30 DIAGNOSIS — I442 Atrioventricular block, complete: Secondary | ICD-10-CM | POA: Diagnosis not present

## 2021-02-04 LAB — CUP PACEART REMOTE DEVICE CHECK
Battery Impedance: 1895 Ohm
Battery Remaining Longevity: 34 mo
Battery Voltage: 2.75 V
Brady Statistic AP VP Percent: 99 %
Brady Statistic AP VS Percent: 0 %
Brady Statistic AS VP Percent: 1 %
Brady Statistic AS VS Percent: 0 %
Date Time Interrogation Session: 20220902104216
Implantable Lead Implant Date: 20130730
Implantable Lead Implant Date: 20130730
Implantable Lead Location: 753859
Implantable Lead Location: 753860
Implantable Lead Model: 5076
Implantable Lead Model: 5092
Implantable Pulse Generator Implant Date: 20130730
Lead Channel Impedance Value: 511 Ohm
Lead Channel Impedance Value: 513 Ohm
Lead Channel Pacing Threshold Amplitude: 1 V
Lead Channel Pacing Threshold Amplitude: 1.125 V
Lead Channel Pacing Threshold Pulse Width: 0.4 ms
Lead Channel Pacing Threshold Pulse Width: 0.4 ms
Lead Channel Setting Pacing Amplitude: 2 V
Lead Channel Setting Pacing Amplitude: 2.25 V
Lead Channel Setting Pacing Pulse Width: 0.4 ms
Lead Channel Setting Sensing Sensitivity: 5.6 mV

## 2021-02-11 NOTE — Progress Notes (Signed)
Remote pacemaker transmission.   

## 2021-05-01 ENCOUNTER — Ambulatory Visit (INDEPENDENT_AMBULATORY_CARE_PROVIDER_SITE_OTHER): Payer: 59

## 2021-05-01 DIAGNOSIS — I442 Atrioventricular block, complete: Secondary | ICD-10-CM | POA: Diagnosis not present

## 2021-05-02 LAB — CUP PACEART REMOTE DEVICE CHECK
Battery Impedance: 1897 Ohm
Battery Remaining Longevity: 35 mo
Battery Voltage: 2.75 V
Brady Statistic AP VP Percent: 99 %
Brady Statistic AP VS Percent: 0 %
Brady Statistic AS VP Percent: 1 %
Brady Statistic AS VS Percent: 0 %
Date Time Interrogation Session: 20221202094238
Implantable Lead Implant Date: 20130730
Implantable Lead Implant Date: 20130730
Implantable Lead Location: 753859
Implantable Lead Location: 753860
Implantable Lead Model: 5076
Implantable Lead Model: 5092
Implantable Pulse Generator Implant Date: 20130730
Lead Channel Impedance Value: 500 Ohm
Lead Channel Impedance Value: 515 Ohm
Lead Channel Pacing Threshold Amplitude: 0.875 V
Lead Channel Pacing Threshold Amplitude: 1.125 V
Lead Channel Pacing Threshold Pulse Width: 0.4 ms
Lead Channel Pacing Threshold Pulse Width: 0.4 ms
Lead Channel Setting Pacing Amplitude: 1.875
Lead Channel Setting Pacing Amplitude: 2.25 V
Lead Channel Setting Pacing Pulse Width: 0.4 ms
Lead Channel Setting Sensing Sensitivity: 5.6 mV

## 2021-05-12 NOTE — Progress Notes (Signed)
Remote pacemaker transmission.   

## 2021-06-26 ENCOUNTER — Other Ambulatory Visit: Payer: Self-pay

## 2021-06-26 ENCOUNTER — Encounter (HOSPITAL_COMMUNITY): Payer: Self-pay

## 2021-06-26 ENCOUNTER — Emergency Department (HOSPITAL_COMMUNITY)
Admission: EM | Admit: 2021-06-26 | Discharge: 2021-06-27 | Disposition: A | Discharge status: Home / Self Care | Payer: Medicare PPO | Attending: Emergency Medicine | Admitting: Emergency Medicine

## 2021-06-26 DIAGNOSIS — I10 Essential (primary) hypertension: Secondary | ICD-10-CM | POA: Diagnosis not present

## 2021-06-26 DIAGNOSIS — R310 Gross hematuria: Secondary | ICD-10-CM | POA: Insufficient documentation

## 2021-06-26 DIAGNOSIS — F17220 Nicotine dependence, chewing tobacco, uncomplicated: Secondary | ICD-10-CM | POA: Diagnosis not present

## 2021-06-26 DIAGNOSIS — I2510 Atherosclerotic heart disease of native coronary artery without angina pectoris: Secondary | ICD-10-CM | POA: Diagnosis not present

## 2021-06-26 DIAGNOSIS — R3912 Poor urinary stream: Secondary | ICD-10-CM | POA: Diagnosis not present

## 2021-06-26 DIAGNOSIS — E119 Type 2 diabetes mellitus without complications: Secondary | ICD-10-CM | POA: Diagnosis not present

## 2021-06-26 DIAGNOSIS — R319 Hematuria, unspecified: Secondary | ICD-10-CM | POA: Diagnosis present

## 2021-06-26 DIAGNOSIS — Z950 Presence of cardiac pacemaker: Secondary | ICD-10-CM | POA: Diagnosis not present

## 2021-06-26 LAB — CBC
HCT: 45.7 % (ref 39.0–52.0)
Hemoglobin: 16 g/dL (ref 13.0–17.0)
MCH: 31.6 pg (ref 26.0–34.0)
MCHC: 35 g/dL (ref 30.0–36.0)
MCV: 90.3 fL (ref 80.0–100.0)
Platelets: 168 10*3/uL (ref 150–400)
RBC: 5.06 MIL/uL (ref 4.22–5.81)
RDW: 13 % (ref 11.5–15.5)
WBC: 9 10*3/uL (ref 4.0–10.5)
nRBC: 0 % (ref 0.0–0.2)

## 2021-06-26 LAB — BASIC METABOLIC PANEL
Anion gap: 7 (ref 5–15)
BUN: 19 mg/dL (ref 8–23)
CO2: 29 mmol/L (ref 22–32)
Calcium: 9.9 mg/dL (ref 8.9–10.3)
Chloride: 95 mmol/L — ABNORMAL LOW (ref 98–111)
Creatinine, Ser: 1.03 mg/dL (ref 0.61–1.24)
GFR, Estimated: >60 mL/min (ref >60)
Glucose, Bld: 114 mg/dL — ABNORMAL HIGH (ref 70–99)
Potassium: 4.3 mmol/L (ref 3.5–5.1)
Sodium: 131 mmol/L — ABNORMAL LOW (ref 135–145)

## 2021-06-26 MED ORDER — SULFAMETHOXAZOLE-TRIMETHOPRIM 800-160 MG PO TABS
1.0000 | ORAL_TABLET | Freq: Once | ORAL | Status: AC
  Administered 2021-06-27: 1 via ORAL
  Filled 2021-06-26: qty 1

## 2021-06-26 NOTE — ED Notes (Signed)
Around 700 ml found in pt bladder
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
## 2021-06-26 NOTE — ED Triage Notes (Signed)
Pt went to the restroom around 5 this evening and noticed blood in his urine. States since then he has not been able to urinate but feels the urge to. Also states blood is still coming out of his penis.

## 2021-06-27 DIAGNOSIS — R310 Gross hematuria: Secondary | ICD-10-CM | POA: Diagnosis not present

## 2021-06-27 LAB — URINALYSIS, ROUTINE W REFLEX MICROSCOPIC: Leukocytes,Ua: NEGATIVE

## 2021-06-27 LAB — URINALYSIS, MICROSCOPIC (REFLEX)
RBC / HPF: >50 RBC/hpf (ref 0–5)
Squamous Epithelial / HPF: NONE SEEN (ref 0–5)
WBC, UA: >50 WBC/hpf (ref 0–5)

## 2021-06-27 MED ORDER — SULFAMETHOXAZOLE-TRIMETHOPRIM 800-160 MG PO TABS: 1.0000 | ORAL_TABLET | Freq: Two times a day (BID) | ORAL | 0 refills | Status: AC

## 2021-06-28 LAB — URINE CULTURE

## 2021-07-01 ENCOUNTER — Encounter: Payer: 59 | Admitting: Internal Medicine

## 2021-07-07 ENCOUNTER — Ambulatory Visit: Payer: 59 | Admitting: Physician Assistant

## 2021-07-07 ENCOUNTER — Other Ambulatory Visit: Payer: Self-pay

## 2021-07-07 ENCOUNTER — Encounter (HOSPITAL_COMMUNITY): Payer: Self-pay | Admitting: Radiology

## 2021-07-07 ENCOUNTER — Ambulatory Visit (HOSPITAL_COMMUNITY)
Admission: RE | Admit: 2021-07-07 | Discharge: 2021-07-07 | Disposition: A | Discharge status: Home / Self Care | Payer: Medicare PPO | Source: Ambulatory Visit | Attending: Physician Assistant | Admitting: Physician Assistant

## 2021-07-07 ENCOUNTER — Ambulatory Visit (INDEPENDENT_AMBULATORY_CARE_PROVIDER_SITE_OTHER): Payer: Medicare PPO | Admitting: Physician Assistant

## 2021-07-07 VITALS — BP 135/72 | HR 80

## 2021-07-07 DIAGNOSIS — R338 Other retention of urine: Secondary | ICD-10-CM

## 2021-07-07 DIAGNOSIS — R339 Retention of urine, unspecified: Secondary | ICD-10-CM | POA: Diagnosis present

## 2021-07-07 DIAGNOSIS — N323 Diverticulum of bladder: Secondary | ICD-10-CM | POA: Diagnosis not present

## 2021-07-07 DIAGNOSIS — N319 Neuromuscular dysfunction of bladder, unspecified: Secondary | ICD-10-CM | POA: Insufficient documentation

## 2021-07-07 DIAGNOSIS — N401 Enlarged prostate with lower urinary tract symptoms: Secondary | ICD-10-CM

## 2021-07-07 DIAGNOSIS — R310 Gross hematuria: Secondary | ICD-10-CM | POA: Insufficient documentation

## 2021-07-07 LAB — BLADDER SCAN AMB NON-IMAGING: Scan Result: 247

## 2021-07-07 MED ORDER — IOHEXOL 300 MG/ML  SOLN
100.0000 mL | Freq: Once | INTRAMUSCULAR | Status: AC | PRN
  Administered 2021-07-07: 100 mL via INTRAVENOUS

## 2021-07-07 MED ORDER — SILODOSIN 8 MG PO CAPS: 8.0000 mg | ORAL_CAPSULE | Freq: Every day | ORAL | 1 refills | Status: DC
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
## 2021-07-07 NOTE — Progress Notes (Signed)
Urological Symptom Review  Patient is experiencing the following symptoms: Burning/pain with urination Get up at night to urinate Leakage of urine Blood in urine Urinary tract infection Erection problems (male only)   Review of Systems  Gastrointestinal (upper)  : Negative for upper GI symptoms  Gastrointestinal (lower) : Constipation  Constitutional : Negative for symptoms  Skin: Negative for skin symptoms  Eyes: Negative for eye symptoms  Ear/Nose/Throat : Negative for Ear/Nose/Throat symptoms  Hematologic/Lymphatic: Bruise/bleed easily  Cardiovascular : Negative for cardiovascular symptoms  Respiratory : Negative for respiratory symptoms  Endocrine: Negative for endocrine symptoms  Musculoskeletal: Negative for musculoskeletal symptoms  Neurological: Negative for neurological symptoms  Psychologic: Negative for psychiatric symptoms  Fill and Pull Catheter Removal  Patient is present today for a catheter removal.  Patient was cleaned and prepped in a sterile fashion 225ml of sterile water/ saline was instilled into the bladder when the patient felt the urge to urinate. 86ml of water was then drained from the balloon.  A 16FR foley cath was removed from the bladder no complications were noted .  Patient was then given some time to void on their own.  Patient can void  125 ml on their own after some time.  Patient tolerated well.  Performed by: Alleah Dearman LPN  post void [id number]  Follow up/ Additional notes: Per PA note

## 2021-07-16 ENCOUNTER — Ambulatory Visit (INDEPENDENT_AMBULATORY_CARE_PROVIDER_SITE_OTHER): Payer: 59 | Admitting: Internal Medicine

## 2021-07-16 ENCOUNTER — Encounter: Payer: Self-pay | Admitting: Internal Medicine

## 2021-07-16 ENCOUNTER — Other Ambulatory Visit: Payer: Self-pay

## 2021-07-16 VITALS — BP 120/80 | HR 79 | Ht 66.0 in | Wt 151.0 lb

## 2021-07-16 DIAGNOSIS — I442 Atrioventricular block, complete: Secondary | ICD-10-CM

## 2021-07-16 NOTE — Progress Notes (Signed)
HPI [name] returns today for followup. He is a pleasant 88 yo man with a h/o [place] attacks and documented pauses of over 10 seconds. The patient also has a h/o BPH and is s/p prostatectomy with indwelling suprapubic catheter. No chest pain or sob and no syncope since his PPM was placed by Dr. Rayann Heman and revised by me. He does note pain in his lower extremities. He also notes weakness in his lower extremities. He is troubled over his wife who has fairly advanced dementia. Allergies  Allergen Reactions   Penicillins Other (See Comments)    Unknown reaction  Did it involve swelling of the face/tongue/throat, SOB, or low BP? Unknown Did it involve sudden or severe rash/hives, skin peeling, or any reaction on the inside of your mouth or nose? Unknown Did you need to seek medical attention at a hospital or doctor's office? Unknown When did it last happen?      childhood allergy If all above answers are NO, may proceed with cephalosporin use.      Current Outpatient Medications  Medication Sig Dispense Refill   acetaminophen (TYLENOL) 650 MG CR tablet Take 650 mg by mouth every 8 (eight) hours as needed for pain or fever.     aspirin EC 81 MG tablet Take 1 tablet (81 mg total) by mouth every morning.     bismuth-metronidazole-tetracycline (PYLERA) 140-125-125 MG capsule Take 3 capsules by mouth 4 (four) times daily -  before meals and at bedtime. 120 capsule 0   cetirizine (ZYRTEC) 10 MG tablet Take 10 mg by mouth daily as needed for allergies.     CONSTULOSE 10 GM/15ML solution Take 30 mLs by mouth 2 (two) times daily as needed for moderate constipation.      hydrochlorothiazide (HYDRODIURIL) 25 MG tablet Take 1 tablet by mouth every morning.     lisinopril (PRINIVIL,ZESTRIL) 20 MG tablet Take 20 mg by mouth 2 (two) times daily.     LORazepam (ATIVAN) 1 MG tablet Take 2 mg by mouth at bedtime.      Melatonin 3 MG CAPS Take 3 mg by mouth at bedtime.     metoprolol tartrate  (LOPRESSOR) 50 MG tablet Take 50 mg by mouth 2 (two) times daily.     pantoprazole (PROTONIX) 20 MG tablet TAKE ONE TABLET (20MG  TOTAL) BY MOUTH TWO TIMES DAILY BEFORE A MEAL 90 tablet 1   potassium chloride SA (K-DUR,KLOR-CON) 20 MEQ tablet Take 20 mEq by mouth every morning.      propranolol (INDERAL) 20 MG tablet Take 20 mg by mouth 2 (two) times daily.     silodosin (RAPAFLO) 8 MG CAPS capsule Take 1 capsule (8 mg total) by mouth daily with breakfast. 30 capsule 1   simvastatin (ZOCOR) 20 MG tablet Take 20 mg by mouth every morning.     sulfamethoxazole-trimethoprim (BACTRIM DS) 800-160 MG tablet Take 1 tablet by mouth 2 (two) times daily as needed (bladder infections). Take for 5 days at a time     vitamin B-12 (CYANOCOBALAMIN) 1000 MCG tablet Take 1,000 mcg by mouth daily.     No current facility-administered medications for this visit.     Past Medical History:  Diagnosis Date   Arthritis    CAD (coronary artery disease)    a.  90% LAD stenosis in 1995 treated with PTCA;  b. 09/2009 CABG x 4: LIMA->LAD, VG->Diag, VG->OM, VG->PDA;  c. 11/2011 Cath: 3VD, 4/4 patent grafts, EF 70%.   Chronic constipation  Colonic polyp    Complete heart block ([place])    a. 11/2011 s/p MDT Adapta L ADDRL 1 Ser # [id number].   Complication of anesthesia    PROBLEMS VOIDING AFTER PREVIOUS SURGERIES   Diabetes mellitus without complication ([place])    NOT ON ANY DIABETIC MEDICATIONS   Fasting hyperglycemia    GERD (gastroesophageal reflux disease)    Hyperlipidemia    Hypertension    Pacemaker JULY 2013   DR.  G.Agripina Guyette AND DR. Clearnce Hasten   Pseudophakia of both eyes    Seizures ([place])    a. prior to diagnosis of heart block and syncope, this was in the differential and pt was briefly on Keppra, started by ER MD.   Syncope    a. 11/2011 18 second pauses noted on Event Monitor assoc w/ syncope   Trauma 1952   Abdominal and chest in 1952; multiple injuries including pelvic fracture, rib fractures and  ruptured bladder   Urinary retention    PT HAS INDWELLING FOLEY CATHETER    ROS:   All systems reviewed and negative except as noted in the HPI.   Past Surgical History:  Procedure Laterality Date   BIOPSY  07/05/2019   Procedure: BIOPSY;  Surgeon: Rogene Houston, MD;  [facility]: [facility];  Service: Endoscopy;;  gastric   BLADDER SURGERY  1950'S   CATARACT EXTRACTION W/PHACO Right 11/22/2012   Procedure: CATARACT EXTRACTION PHACO AND INTRAOCULAR LENS PLACEMENT ([place]);  Surgeon: Elta Guadeloupe T. Gershon Crane, MD;  Location: [facility];  Service: Ophthalmology;  Laterality: Right;  CDE 10.27   CATARACT EXTRACTION W/PHACO Left 12/20/2012   Procedure: CATARACT EXTRACTION PHACO AND INTRAOCULAR LENS PLACEMENT ([facility]);  Surgeon: Elta Guadeloupe T. Gershon Crane, MD;  Location: [facility];  Service: Ophthalmology;  Laterality: Left;  CDE:6.67   COLONOSCOPY W/ POLYPECTOMY     CORONARY ARTERY BYPASS GRAFT  09/2009   4 vessels   ESOPHAGEAL DILATION  07/05/2019   Procedure: ESOPHAGEAL DILATION;  Surgeon: Rogene Houston, MD;  [facility];  Service: Endoscopy;;   ESOPHAGOGASTRODUODENOSCOPY (EGD) WITH PROPOFOL N/A 07/05/2019   Procedure: ESOPHAGOGASTRODUODENOSCOPY (EGD) WITH PROPOFOL;  Surgeon: Rogene Houston, MD;  [facility]: [facility];  Service: Endoscopy;  Laterality: N/A;  110-office notified pt new arrival time 10:45am   FEMORAL HERNIA REPAIR     INSERTION OF SUPRAPUBIC CATHETER  04/04/2012   Procedure: INSERTION OF SUPRAPUBIC CATHETER;  Surgeon: Claybon Jabs, MD;  Location: [facility];  Service: Urology;  Laterality: N/A;   LEAD REVISION N/A 12/30/2011   Procedure: LEAD REVISION;  Surgeon: Evans Lance, MD;  Location: [facility] CATH LAB;  Service: Cardiovascular;  Laterality: N/A;   LEFT HEART CATHETERIZATION WITH CORONARY/GRAFT ANGIOGRAM  12/29/2011   Procedure: LEFT HEART CATHETERIZATION WITH Beatrix Fetters;  Surgeon: Sherren Mocha, MD;  Location[facility];  Service: Cardiovascular;;   PACEMAKER PLACEMENT      TEMPORARY PACEMAKER INSERTION N/A 12/29/2011   Procedure: TEMPORARY PACEMAKER INSERTION;  Surgeon: Sherren Mocha, MD;  Location: [facility] CATH LAB;  Service: Cardiovascular;  Laterality: N/A;   TRANSURETHRAL PROSTATECTOMY WITH GYRUS INSTRUMENTS  04/04/2012   Procedure: TRANSURETHRAL PROSTATECTOMY WITH GYRUS INSTRUMENTS;  Surgeon: Claybon Jabs, MD;  Location: [facility];  Service: Urology;  Laterality: N/A;  TURP with Gyrus and Suprapubic Tube Placement   YAG LASER APPLICATION Left [id number]   Procedure: YAG LASER APPLICATION;  Surgeon: Rutherford Guys, MD;  Location: [facility];  Service: Ophthalmology;  Laterality: Left;     Family History  Problem  Relation Age of Onset   Heart block Mother    Heart murmur Mother    Aneurysm Father    Coronary artery disease Brother        x3   Lung cancer Brother      Social History   Socioeconomic History   Marital status: Married    Spouse name: Not on file   Number of children: Not on file   Years of education: Not on file   Highest education level: Not on file  Occupational History   Occupation: Retired  Tobacco Use   Smoking status: Never   Smokeless tobacco: Current    Types: Nurse, children's Use: Never used  Substance and Sexual Activity   Alcohol use: No   Drug use: No   Sexual activity: Yes  Other Topics Concern   Not on file  Social History Narrative   Not on file   Social Determinants of Health   Financial Resource Strain: Not on file  Food Insecurity: Not on file  Transportation Needs: Not on file  Physical Activity: Not on file  Stress: Not on file  Social Connections: Not on file  Intimate Partner Violence: Not on file     BP 120/80    Pulse 79    Ht 5\' 6"  (1.676 m)    Wt 151 lb (68.5 kg)    SpO2 95%    BMI 24.37 kg/m   Physical Exam:  Well appearing NAD HEENT: Unremarkable Neck:  No JVD, no thyromegally Lymphatics:  No adenopathy Back:  No CVA tenderness Lungs:  Clear with no wheezes HEART:  Regular rate  rhythm, no murmurs, no rubs, no clicks Abd:  soft, positive bowel sounds, no organomegally, no rebound, no guarding Ext:  2 plus pulses, no edema, no cyanosis, no clubbing Skin:  No rashes no nodules Neuro:  CN II through XII intact, motor grossly intact  EKG - NSR with AV pacing  DEVICE  Normal device function.  See PaceArt for details.   Assess/Plan:  1. CHB - he is asymptomatic, s/p PPM insertion. 2. Sinus node dysfunction - he has no escape today. He is asymptomatic. 3. PPM - his medtronic DDD PM is working normally. We will recheck in several months 4. Chest pain - his symptoms are quiet. He will undergo watchful waiting.   Guy Overlie Ziyan Hillmer,MD

## 2021-07-16 NOTE — Patient Instructions (Signed)
Medication Instructions:  Your physician recommends that you continue on your current medications as directed. Please refer to the Current Medication list given to you today.   Labwork: None today  Testing/Procedures: None today  Follow-Up: 1 year  Any Other Special Instructions Will Be Listed Below (If Applicable).  If you need a refill on your cardiac medications before your next appointment, please call your pharmacy.  

## 2021-07-31 ENCOUNTER — Ambulatory Visit (INDEPENDENT_AMBULATORY_CARE_PROVIDER_SITE_OTHER): Payer: 59

## 2021-07-31 DIAGNOSIS — I442 Atrioventricular block, complete: Secondary | ICD-10-CM

## 2021-07-31 LAB — CUP PACEART REMOTE DEVICE CHECK
Battery Impedance: 2068 Ohm
Battery Remaining Longevity: 28 mo
Battery Voltage: 2.75 V
Brady Statistic AP VP Percent: 98 %
Brady Statistic AP VS Percent: 0 %
Brady Statistic AS VP Percent: 2 %
Brady Statistic AS VS Percent: 0 %
Date Time Interrogation Session: 20230301090224
Implantable Lead Implant Date: 20130730
Implantable Lead Implant Date: 20130730
Implantable Lead Location: 753859
Implantable Lead Location: 753860
Implantable Lead Model: 5076
Implantable Lead Model: 5092
Implantable Pulse Generator Implant Date: 20130730
Lead Channel Impedance Value: 461 Ohm
Lead Channel Impedance Value: 487 Ohm
Lead Channel Pacing Threshold Amplitude: 0.875 V
Lead Channel Pacing Threshold Amplitude: 1.25 V
Lead Channel Pacing Threshold Pulse Width: 0.4 ms
Lead Channel Pacing Threshold Pulse Width: 0.4 ms
Lead Channel Setting Pacing Amplitude: 1.875
Lead Channel Setting Pacing Amplitude: 2.5 V
Lead Channel Setting Pacing Pulse Width: 0.4 ms
Lead Channel Setting Sensing Sensitivity: 5.6 mV

## 2021-08-07 NOTE — Progress Notes (Signed)
Remote pacemaker transmission.   

## 2021-08-15 ENCOUNTER — Other Ambulatory Visit: Payer: 59 | Admitting: Urology

## 2021-10-30 ENCOUNTER — Ambulatory Visit (INDEPENDENT_AMBULATORY_CARE_PROVIDER_SITE_OTHER): Payer: 59

## 2021-10-30 DIAGNOSIS — I442 Atrioventricular block, complete: Secondary | ICD-10-CM

## 2021-11-01 LAB — CUP PACEART REMOTE DEVICE CHECK
Battery Impedance: 2097 Ohm
Battery Remaining Longevity: 29 mo
Battery Voltage: 2.74 V
Brady Statistic AP VP Percent: 98 %
Brady Statistic AP VS Percent: 0 %
Brady Statistic AS VP Percent: 2 %
Brady Statistic AS VS Percent: 0 %
Date Time Interrogation Session: 20230602123404
Implantable Lead Implant Date: 20130730
Implantable Lead Implant Date: 20130730
Implantable Lead Location: 753859
Implantable Lead Location: 753860
Implantable Lead Model: 5076
Implantable Lead Model: 5092
Implantable Pulse Generator Implant Date: 20130730
Lead Channel Impedance Value: 505 Ohm
Lead Channel Impedance Value: 546 Ohm
Lead Channel Pacing Threshold Amplitude: 1 V
Lead Channel Pacing Threshold Amplitude: 1.25 V
Lead Channel Pacing Threshold Pulse Width: 0.4 ms
Lead Channel Pacing Threshold Pulse Width: 0.4 ms
Lead Channel Setting Pacing Amplitude: 2 V
Lead Channel Setting Pacing Amplitude: 2.5 V
Lead Channel Setting Pacing Pulse Width: 0.4 ms
Lead Channel Setting Sensing Sensitivity: 5.6 mV

## 2021-11-07 NOTE — Progress Notes (Signed)
Remote pacemaker transmission.   

## 2022-01-29 ENCOUNTER — Ambulatory Visit (INDEPENDENT_AMBULATORY_CARE_PROVIDER_SITE_OTHER): Payer: 59

## 2022-01-29 DIAGNOSIS — I442 Atrioventricular block, complete: Secondary | ICD-10-CM

## 2022-02-03 LAB — CUP PACEART REMOTE DEVICE CHECK
Battery Impedance: 2249 Ohm
Battery Remaining Longevity: 26 mo
Battery Voltage: 2.74 V
Brady Statistic AP VP Percent: 98 %
Brady Statistic AP VS Percent: 0 %
Brady Statistic AS VP Percent: 2 %
Brady Statistic AS VS Percent: 0 %
Date Time Interrogation Session: 20230901112921
Implantable Lead Implant Date: 20130730
Implantable Lead Implant Date: 20130730
Implantable Lead Location: 753859
Implantable Lead Location: 753860
Implantable Lead Model: 5076
Implantable Lead Model: 5092
Implantable Pulse Generator Implant Date: 20130730
Lead Channel Impedance Value: 532 Ohm
Lead Channel Impedance Value: 560 Ohm
Lead Channel Pacing Threshold Amplitude: 0.75 V
Lead Channel Pacing Threshold Amplitude: 1.375 V
Lead Channel Pacing Threshold Pulse Width: 0.4 ms
Lead Channel Pacing Threshold Pulse Width: 0.4 ms
Lead Channel Setting Pacing Amplitude: 1.875
Lead Channel Setting Pacing Amplitude: 2.75 V
Lead Channel Setting Pacing Pulse Width: 0.4 ms
Lead Channel Setting Sensing Sensitivity: 5.6 mV

## 2022-02-19 NOTE — Progress Notes (Signed)
Remote pacemaker transmission.   

## 2022-03-04 ENCOUNTER — Observation Stay (HOSPITAL_COMMUNITY): Payer: Medicare PPO

## 2022-03-04 ENCOUNTER — Encounter (HOSPITAL_COMMUNITY): Payer: Self-pay | Admitting: Emergency Medicine

## 2022-03-04 ENCOUNTER — Other Ambulatory Visit: Payer: Self-pay

## 2022-03-04 ENCOUNTER — Inpatient Hospital Stay (HOSPITAL_COMMUNITY)
Admission: EM | Admit: 2022-03-04 | Discharge: 2022-03-16 | DRG: 177 | Disposition: A | Discharge status: Skilled Nursing Facility | Payer: Medicare PPO | Attending: Family Medicine | Admitting: Family Medicine

## 2022-03-04 ENCOUNTER — Emergency Department (HOSPITAL_COMMUNITY): Payer: Medicare PPO

## 2022-03-04 DIAGNOSIS — I442 Atrioventricular block, complete: Secondary | ICD-10-CM | POA: Diagnosis present

## 2022-03-04 DIAGNOSIS — R296 Repeated falls: Secondary | ICD-10-CM | POA: Diagnosis present

## 2022-03-04 DIAGNOSIS — I272 Pulmonary hypertension, unspecified: Secondary | ICD-10-CM | POA: Diagnosis present

## 2022-03-04 DIAGNOSIS — Z8249 Family history of ischemic heart disease and other diseases of the circulatory system: Secondary | ICD-10-CM

## 2022-03-04 DIAGNOSIS — Z781 Physical restraint status: Secondary | ICD-10-CM

## 2022-03-04 DIAGNOSIS — I509 Heart failure, unspecified: Secondary | ICD-10-CM

## 2022-03-04 DIAGNOSIS — E876 Hypokalemia: Secondary | ICD-10-CM | POA: Diagnosis present

## 2022-03-04 DIAGNOSIS — E871 Hypo-osmolality and hyponatremia: Secondary | ICD-10-CM | POA: Diagnosis not present

## 2022-03-04 DIAGNOSIS — R531 Weakness: Secondary | ICD-10-CM | POA: Diagnosis not present

## 2022-03-04 DIAGNOSIS — U071 COVID-19: Principal | ICD-10-CM | POA: Diagnosis present

## 2022-03-04 DIAGNOSIS — Z950 Presence of cardiac pacemaker: Secondary | ICD-10-CM | POA: Diagnosis present

## 2022-03-04 DIAGNOSIS — Z880 Allergy status to penicillin: Secondary | ICD-10-CM

## 2022-03-04 DIAGNOSIS — E139 Other specified diabetes mellitus without complications: Secondary | ICD-10-CM

## 2022-03-04 DIAGNOSIS — N39498 Other specified urinary incontinence: Secondary | ICD-10-CM | POA: Diagnosis present

## 2022-03-04 DIAGNOSIS — E785 Hyperlipidemia, unspecified: Secondary | ICD-10-CM | POA: Diagnosis present

## 2022-03-04 DIAGNOSIS — Z9181 History of falling: Secondary | ICD-10-CM

## 2022-03-04 DIAGNOSIS — I5033 Acute on chronic diastolic (congestive) heart failure: Secondary | ICD-10-CM | POA: Diagnosis present

## 2022-03-04 DIAGNOSIS — N401 Enlarged prostate with lower urinary tract symptoms: Secondary | ICD-10-CM | POA: Diagnosis present

## 2022-03-04 DIAGNOSIS — K219 Gastro-esophageal reflux disease without esophagitis: Secondary | ICD-10-CM | POA: Diagnosis present

## 2022-03-04 DIAGNOSIS — Z961 Presence of intraocular lens: Secondary | ICD-10-CM | POA: Diagnosis present

## 2022-03-04 DIAGNOSIS — I2510 Atherosclerotic heart disease of native coronary artery without angina pectoris: Secondary | ICD-10-CM | POA: Diagnosis present

## 2022-03-04 DIAGNOSIS — Z9841 Cataract extraction status, right eye: Secondary | ICD-10-CM

## 2022-03-04 DIAGNOSIS — R339 Retention of urine, unspecified: Secondary | ICD-10-CM | POA: Diagnosis present

## 2022-03-04 DIAGNOSIS — F05 Delirium due to known physiological condition: Secondary | ICD-10-CM | POA: Diagnosis not present

## 2022-03-04 DIAGNOSIS — E1159 Type 2 diabetes mellitus with other circulatory complications: Secondary | ICD-10-CM

## 2022-03-04 DIAGNOSIS — Z9842 Cataract extraction status, left eye: Secondary | ICD-10-CM

## 2022-03-04 DIAGNOSIS — I10 Essential (primary) hypertension: Secondary | ICD-10-CM | POA: Diagnosis present

## 2022-03-04 DIAGNOSIS — Y92009 Unspecified place in unspecified non-institutional (private) residence as the place of occurrence of the external cause: Secondary | ICD-10-CM

## 2022-03-04 DIAGNOSIS — E119 Type 2 diabetes mellitus without complications: Secondary | ICD-10-CM

## 2022-03-04 DIAGNOSIS — Z79899 Other long term (current) drug therapy: Secondary | ICD-10-CM

## 2022-03-04 DIAGNOSIS — I110 Hypertensive heart disease with heart failure: Secondary | ICD-10-CM | POA: Diagnosis present

## 2022-03-04 DIAGNOSIS — R509 Fever, unspecified: Secondary | ICD-10-CM

## 2022-03-04 DIAGNOSIS — W06XXXA Fall from bed, initial encounter: Secondary | ICD-10-CM | POA: Diagnosis present

## 2022-03-04 DIAGNOSIS — Z951 Presence of aortocoronary bypass graft: Secondary | ICD-10-CM

## 2022-03-04 DIAGNOSIS — N390 Urinary tract infection, site not specified: Secondary | ICD-10-CM | POA: Diagnosis present

## 2022-03-04 DIAGNOSIS — Z7982 Long term (current) use of aspirin: Secondary | ICD-10-CM

## 2022-03-04 LAB — COMPREHENSIVE METABOLIC PANEL
ALT: 15 U/L (ref 0–44)
AST: 22 U/L (ref 15–41)
Albumin: 3.6 g/dL (ref 3.5–5.0)
Alkaline Phosphatase: 54 U/L (ref 38–126)
Anion gap: 8 (ref 5–15)
BUN: 20 mg/dL (ref 8–23)
CO2: 28 mmol/L (ref 22–32)
Calcium: 9.8 mg/dL (ref 8.9–10.3)
Chloride: 100 mmol/L (ref 98–111)
Creatinine, Ser: 0.94 mg/dL (ref 0.61–1.24)
GFR, Estimated: >60 mL/min (ref >60)
Glucose, Bld: 243 mg/dL — ABNORMAL HIGH (ref 70–99)
Potassium: 3.4 mmol/L — ABNORMAL LOW (ref 3.5–5.1)
Sodium: 136 mmol/L (ref 135–145)
Total Bilirubin: 0.9 mg/dL (ref 0.3–1.2)
Total Protein: 6.4 g/dL — ABNORMAL LOW (ref 6.5–8.1)

## 2022-03-04 LAB — CBC WITH DIFFERENTIAL/PLATELET
Abs Immature Granulocytes: 0.03 10*3/uL (ref 0.00–0.07)
Basophils Absolute: 0 10*3/uL (ref 0.0–0.1)
Basophils Relative: 0 %
Eosinophils Absolute: 0 10*3/uL (ref 0.0–0.5)
Eosinophils Relative: 0 %
HCT: 43.2 % (ref 39.0–52.0)
Hemoglobin: 14.9 g/dL (ref 13.0–17.0)
Immature Granulocytes: 0 %
Lymphocytes Relative: 6 %
Lymphs Abs: 0.6 10*3/uL — ABNORMAL LOW (ref 0.7–4.0)
MCH: 32.2 pg (ref 26.0–34.0)
MCHC: 34.5 g/dL (ref 30.0–36.0)
MCV: 93.3 fL (ref 80.0–100.0)
Monocytes Absolute: 1.3 10*3/uL — ABNORMAL HIGH (ref 0.1–1.0)
Monocytes Relative: 14 %
Neutro Abs: 7.2 10*3/uL (ref 1.7–7.7)
Neutrophils Relative %: 80 %
Platelets: 143 10*3/uL — ABNORMAL LOW (ref 150–400)
RBC: 4.63 MIL/uL (ref 4.22–5.81)
RDW: 12.8 % (ref 11.5–15.5)
WBC: 9 10*3/uL (ref 4.0–10.5)
nRBC: 0 % (ref 0.0–0.2)

## 2022-03-04 LAB — APTT: aPTT: 29 seconds (ref 24–36)

## 2022-03-04 LAB — PROCALCITONIN: Procalcitonin: <0.1 ng/mL

## 2022-03-04 LAB — URINALYSIS, ROUTINE W REFLEX MICROSCOPIC
Bacteria, UA: NONE SEEN
Bilirubin Urine: NEGATIVE
Glucose, UA: >=500 mg/dL — AB
Hgb urine dipstick: NEGATIVE
Ketones, ur: 5 mg/dL — AB
Nitrite: NEGATIVE
Protein, ur: >=300 mg/dL — AB
Specific Gravity, Urine: 1.018 (ref 1.005–1.030)
WBC, UA: >50 WBC/hpf — ABNORMAL HIGH (ref 0–5)
pH: 6 (ref 5.0–8.0)

## 2022-03-04 LAB — BRAIN NATRIURETIC PEPTIDE: B Natriuretic Peptide: 777 pg/mL — ABNORMAL HIGH (ref 0.0–100.0)

## 2022-03-04 LAB — LACTIC ACID, PLASMA
Lactic Acid, Venous: 1.7 mmol/L (ref 0.5–1.9)
Lactic Acid, Venous: 1 mmol/L (ref 0.5–1.9)

## 2022-03-04 LAB — PROTIME-INR
INR: 1.1 (ref 0.8–1.2)
Prothrombin Time: 14.4 seconds (ref 11.4–15.2)

## 2022-03-04 LAB — GLUCOSE, CAPILLARY: Glucose-Capillary: 233 mg/dL — ABNORMAL HIGH (ref 70–99)

## 2022-03-04 LAB — FERRITIN: Ferritin: 120 ng/mL (ref 24–336)

## 2022-03-04 LAB — TROPONIN I (HIGH SENSITIVITY): Troponin I (High Sensitivity): 108 ng/L (ref <18)

## 2022-03-04 LAB — LACTATE DEHYDROGENASE: LDH: 165 U/L (ref 98–192)

## 2022-03-04 LAB — D-DIMER, QUANTITATIVE: D-Dimer, Quant: 0.68 ug/mL-FEU — ABNORMAL HIGH (ref 0.00–0.50)

## 2022-03-04 LAB — SARS CORONAVIRUS 2 BY RT PCR: SARS Coronavirus 2 by RT PCR: POSITIVE — AB

## 2022-03-04 MED ORDER — POLYETHYLENE GLYCOL 3350 17 G PO PACK: 17.0000 g | PACK | Freq: Every day | ORAL | Status: DC | PRN

## 2022-03-04 MED ORDER — ACETAMINOPHEN 650 MG RE SUPP: 650.0000 mg | Freq: Four times a day (QID) | RECTAL | Status: DC | PRN

## 2022-03-04 MED ORDER — PANTOPRAZOLE SODIUM 20 MG PO TBEC
20.0000 mg | DELAYED_RELEASE_TABLET | Freq: Two times a day (BID) | ORAL | Status: DC
  Filled 2022-03-04 (×4): qty 1

## 2022-03-04 MED ORDER — FUROSEMIDE 10 MG/ML IJ SOLN
40.0000 mg | Freq: Two times a day (BID) | INTRAMUSCULAR | Status: AC
  Administered 2022-03-04 – 2022-03-05 (×2): 40 mg via INTRAVENOUS
  Filled 2022-03-04 (×2): qty 4

## 2022-03-04 MED ORDER — LACTATED RINGERS IV BOLUS (SEPSIS)
1000.0000 mL | Freq: Once | INTRAVENOUS | Status: AC
  Administered 2022-03-04: 1000 mL via INTRAVENOUS

## 2022-03-04 MED ORDER — ACETAMINOPHEN 325 MG PO TABS
650.0000 mg | ORAL_TABLET | Freq: Once | ORAL | Status: AC
  Administered 2022-03-04: 650 mg via ORAL
  Filled 2022-03-04: qty 2

## 2022-03-04 MED ORDER — NIRMATRELVIR/RITONAVIR (PAXLOVID)TABLET
3.0000 | ORAL_TABLET | Freq: Two times a day (BID) | ORAL | Status: DC
  Administered 2022-03-04: 3 via ORAL
  Filled 2022-03-04: qty 30

## 2022-03-04 MED ORDER — LISINOPRIL 10 MG PO TABS
20.0000 mg | ORAL_TABLET | Freq: Two times a day (BID) | ORAL | Status: DC
  Administered 2022-03-04 – 2022-03-14 (×20): 20 mg via ORAL
  Filled 2022-03-04 (×21): qty 2

## 2022-03-04 MED ORDER — ASPIRIN 81 MG PO TBEC
81.0000 mg | DELAYED_RELEASE_TABLET | Freq: Every morning | ORAL | Status: DC
  Administered 2022-03-04 – 2022-03-16 (×13): 81 mg via ORAL
  Filled 2022-03-04 (×13): qty 1

## 2022-03-04 MED ORDER — PROMETHAZINE HCL 12.5 MG PO TABS
12.5000 mg | ORAL_TABLET | Freq: Four times a day (QID) | ORAL | Status: DC | PRN
  Administered 2022-03-07: 12.5 mg via ORAL
  Filled 2022-03-04: qty 1

## 2022-03-04 MED ORDER — LORAZEPAM 1 MG PO TABS
2.0000 mg | ORAL_TABLET | Freq: Every day | ORAL | Status: DC
  Administered 2022-03-04 – 2022-03-06 (×3): 2 mg via ORAL
  Filled 2022-03-04 (×3): qty 2

## 2022-03-04 MED ORDER — INSULIN ASPART 100 UNIT/ML IJ SOLN
0.0000 [IU] | Freq: Three times a day (TID) | INTRAMUSCULAR | Status: DC
  Administered 2022-03-05: 1 [IU] via SUBCUTANEOUS
  Administered 2022-03-05: 2 [IU] via SUBCUTANEOUS
  Administered 2022-03-06: 1 [IU] via SUBCUTANEOUS
  Administered 2022-03-06: 2 [IU] via SUBCUTANEOUS
  Administered 2022-03-08: 3 [IU] via SUBCUTANEOUS
  Administered 2022-03-08: 1 [IU] via SUBCUTANEOUS
  Administered 2022-03-09: 3 [IU] via SUBCUTANEOUS
  Administered 2022-03-09: 2 [IU] via SUBCUTANEOUS
  Administered 2022-03-09: 1 [IU] via SUBCUTANEOUS
  Administered 2022-03-10: 3 [IU] via SUBCUTANEOUS
  Administered 2022-03-10: 2 [IU] via SUBCUTANEOUS
  Administered 2022-03-10: 3 [IU] via SUBCUTANEOUS
  Administered 2022-03-11: 2 [IU] via SUBCUTANEOUS
  Administered 2022-03-11: 1 [IU] via SUBCUTANEOUS
  Administered 2022-03-11 – 2022-03-12 (×3): 2 [IU] via SUBCUTANEOUS
  Administered 2022-03-12 – 2022-03-15 (×6): 1 [IU] via SUBCUTANEOUS
  Administered 2022-03-15 – 2022-03-16 (×2): 3 [IU] via SUBCUTANEOUS
  Administered 2022-03-16: 1 [IU] via SUBCUTANEOUS

## 2022-03-04 MED ORDER — HYDROCHLOROTHIAZIDE 25 MG PO TABS
25.0000 mg | ORAL_TABLET | Freq: Every morning | ORAL | Status: DC
  Administered 2022-03-04 – 2022-03-05 (×2): 25 mg via ORAL
  Filled 2022-03-04 (×2): qty 1

## 2022-03-04 MED ORDER — ENOXAPARIN SODIUM 40 MG/0.4ML IJ SOSY
40.0000 mg | PREFILLED_SYRINGE | INTRAMUSCULAR | Status: DC
  Administered 2022-03-04 – 2022-03-15 (×12): 40 mg via SUBCUTANEOUS
  Filled 2022-03-04 (×12): qty 0.4

## 2022-03-04 MED ORDER — METOPROLOL TARTRATE 50 MG PO TABS
50.0000 mg | ORAL_TABLET | Freq: Two times a day (BID) | ORAL | Status: DC
  Administered 2022-03-04 – 2022-03-15 (×21): 50 mg via ORAL
  Filled 2022-03-04 (×25): qty 1

## 2022-03-04 MED ORDER — INSULIN ASPART 100 UNIT/ML IJ SOLN
0.0000 [IU] | Freq: Every day | INTRAMUSCULAR | Status: DC
  Administered 2022-03-04 – 2022-03-10 (×3): 2 [IU] via SUBCUTANEOUS

## 2022-03-04 MED ORDER — POTASSIUM CHLORIDE 20 MEQ PO PACK
40.0000 meq | PACK | Freq: Once | ORAL | Status: AC
  Administered 2022-03-04: 40 meq via ORAL
  Filled 2022-03-04: qty 2

## 2022-03-04 MED ORDER — ACETAMINOPHEN 325 MG PO TABS
650.0000 mg | ORAL_TABLET | Freq: Four times a day (QID) | ORAL | Status: DC | PRN
  Administered 2022-03-05 – 2022-03-15 (×4): 650 mg via ORAL
  Filled 2022-03-04 (×3): qty 2

## 2022-03-04 NOTE — ED Provider Notes (Signed)
[facility] EMERGENCY DEPARTMENT Provider Note   CSN: [id number] Arrival date & time: 03/04/22  1151     History  Chief Complaint  Patient presents with   Weakness    Guy Roberson is a 88 y.o. male.   Weakness  Patient has a history of coronary artery disease, hyperlipidemia, hypertension, syncope, heart block, seizures, urinary retention, diabetes who presents to the ED for evaluation of weakness.  Patient has had some trouble with his balance and coordination.  He has had some falls over the last couple weeks.  He is required assistance to get up but once he was helped up he was able to walk on his own with the assistance of his walker.  He slipped out of bed this morning.  Patient was unable to get up on his own.  Family and EMS tried to help him up but patient was not able to stand once he was assisted up.  He is feeling weak all over.  He denies any chest pain.  No rashes.  He has had some cough and congestion recently.  In the ED he was noted to have a fever.  Patient was not aware that he was having any fevers.    Home Medications Prior to Admission medications   Medication Sig Start Date End Date Taking? Authorizing Provider  ALPRAZolam Prudy Feeler) 0.5 MG tablet Take 0.5 mg by mouth 2 (two) times daily. 02/05/22  Yes [provider]  aspirin EC 81 MG tablet Take 1 tablet (81 mg total) by mouth every morning. 07/06/19  Yes Rehman, Joline Maxcy, MD  cetirizine (ZYRTEC) 10 MG tablet Take 10 mg by mouth daily as needed for allergies.   Yes [provider]  CONSTULOSE 10 GM/15ML solution Take 30 mLs by mouth 2 (two) times daily as needed for moderate constipation.  07/27/18  Yes [provider]  hydrochlorothiazide (HYDRODIURIL) 25 MG tablet Take 1 tablet by mouth every morning. 10/21/20  Yes [provider]  HYDROcodone-acetaminophen (NORCO/VICODIN) 5-325 MG tablet Take 1 tablet by mouth daily as needed. 01/07/22  Yes [provider]  lisinopril  (PRINIVIL,ZESTRIL) 20 MG tablet Take 20 mg by mouth 2 (two) times daily.   Yes [provider]  LORazepam (ATIVAN) 1 MG tablet Take 2 mg by mouth at bedtime.    Yes [provider]  Melatonin 3 MG CAPS Take 3 mg by mouth at bedtime.   Yes [provider]  metoprolol tartrate (LOPRESSOR) 50 MG tablet Take 50 mg by mouth 2 (two) times daily.   Yes [provider]  pantoprazole (PROTONIX) 20 MG tablet TAKE ONE TABLET (20MG  TOTAL) BY MOUTH TWO TIMES DAILY BEFORE A MEAL Patient taking differently: Take 20 mg by mouth 2 (two) times daily before a meal. 10/02/19  Yes Rehman, 12/02/19, MD  potassium chloride SA (K-DUR,KLOR-CON) 20 MEQ tablet Take 20 mEq by mouth every morning.    Yes [provider]  simvastatin (ZOCOR) 20 MG tablet Take 20 mg by mouth every morning.   Yes [provider]  vitamin B-12 (CYANOCOBALAMIN) 1000 MCG tablet Take 1,000 mcg by mouth daily.   Yes [provider]  acetaminophen (TYLENOL) 650 MG CR tablet Take 650 mg by mouth every 8 (eight) hours as needed for pain or fever. 02/02/20   [provider]  tamsulosin (FLOMAX) 0.4 MG CAPS capsule Take 0.4 mg by mouth daily. 03/03/22   [provider]      Allergies    Penicillins  Review of Systems   Review of Systems  Neurological:  Positive for weakness.    Physical Exam Updated Vital Signs BP 127/60   Pulse 66   Temp 98.5 F (36.9 C) (Oral)   Resp (!) 24   Ht 1.676 m (5\' 6" )   Wt 68.5 kg   SpO2 94%   BMI 24.37 kg/m  Physical Exam Vitals and nursing note reviewed.  Constitutional:      Appearance: He is well-developed. He is not diaphoretic.  HENT:     Head: Normocephalic and atraumatic.     Right Ear: External ear normal.     Left Ear: External ear normal.  Eyes:     General: No scleral icterus.       Right eye: No discharge.        Left eye: No discharge.     Conjunctiva/sclera: Conjunctivae normal.  Neck:     Trachea: No  tracheal deviation.  Cardiovascular:     Rate and Rhythm: Normal rate and regular rhythm.  Pulmonary:     Effort: Pulmonary effort is normal. No respiratory distress.     Breath sounds: Normal breath sounds. No stridor. No wheezing or rales.  Abdominal:     General: Bowel sounds are normal. There is no distension.     Palpations: Abdomen is soft.     Tenderness: There is no abdominal tenderness. There is no guarding or rebound.  Musculoskeletal:        General: No tenderness or deformity.     Cervical back: Neck supple.  Skin:    General: Skin is warm and dry.     Findings: No rash.  Neurological:     General: No focal deficit present.     Mental Status: He is alert.     Cranial Nerves: No cranial nerve deficit (no facial droop, extraocular movements intact, no slurred speech).     Sensory: No sensory deficit.     Motor: No abnormal muscle tone or seizure activity.     Coordination: Coordination normal.     Comments: Patient able to lift both arms and legs off the bed, alert and oriented  Psychiatric:        Mood and Affect: Mood normal.     ED Results / Procedures / Treatments   Labs (all labs ordered are listed, but only abnormal results are displayed) Labs Reviewed  SARS CORONAVIRUS 2 BY RT PCR - Abnormal; Notable for the following components:      Result Value   SARS Coronavirus 2 by RT PCR POSITIVE (*)    All other components within normal limits  COMPREHENSIVE METABOLIC PANEL - Abnormal; Notable for the following components:   Potassium 3.4 (*)    Glucose, Bld 243 (*)    Total Protein 6.4 (*)    All other components within normal limits  CBC WITH DIFFERENTIAL/PLATELET - Abnormal; Notable for the following components:   Platelets 143 (*)    Lymphs Abs 0.6 (*)    Monocytes Absolute 1.3 (*)    All other components within normal limits  URINALYSIS, ROUTINE W REFLEX MICROSCOPIC - Abnormal; Notable for the following components:   APPearance HAZY (*)    Glucose, UA  >=500 (*)    Ketones, ur 5 (*)    Protein, ur >=300 (*)    Leukocytes,Ua MODERATE (*)    WBC, UA >50 (*)    All other components within normal limits  CULTURE, BLOOD (ROUTINE X 2)  CULTURE, BLOOD (ROUTINE X  2)  LACTIC ACID, PLASMA  LACTIC ACID, PLASMA  PROTIME-INR  APTT    EKG EKG Interpretation  Date/Time:  Wednesday March 04 2022 12:00:57 EDT Ventricular Rate:  86 PR Interval:  144 QRS Duration: 178 QT Interval:  411 QTC Calculation: 492 R Axis:   -77 Text Interpretation: A-V dual-paced rhythm with some inhibition No further analysis attempted due to paced rhythm No significant change since last tracing Confirmed by Linwood Dibbles [phone]) on 03/04/2022 12:22:39 PM  Radiology DG Chest Port 1 View  Result Date: 03/04/2022 CLINICAL DATA:  Possible sepsis. Leg weakness yesterday after therapy. Lower extremity edema. EXAM: PORTABLE CHEST 1 VIEW COMPARISON:  Two-view chest x-ray 09/09/2018 FINDINGS: Heart size upper limits of normal. Atherosclerotic calcifications are present at the aorta. Patient is status post median sternotomy for CABG. Pacing wires are stable. Mild interstitial edema is present. Moderate pulmonary vascular congestion is noted. Airspace opacity is present along the minor fissure. And in the right lower lobe. Volume loss is evident on the right. No other focal airspace disease is present. Degenerative changes are present in the shoulders bilaterally. IMPRESSION: 1. Mild interstitial edema and moderate pulmonary vascular congestion suggesting congestive heart failure. 2. Airspace opacity along the minor fissure and in the right lower lobe likely reflects atelectasis. Infection is not excluded. Electronically Signed   By: Marin Roberts M.D.   On: 03/04/2022 13:16    Procedures Procedures    Medications Ordered in ED Medications  lactated ringers bolus 1,000 mL (1,000 mLs Intravenous Bolus 03/04/22 1331)  acetaminophen (TYLENOL) tablet 650 mg (650 mg Oral Given  03/04/22 1330)    ED Course/ Medical Decision Making/ A&P Clinical Course as of 03/04/22 1612  Wed Mar 04, 2022  1521 Comprehensive metabolic panel(!) Metabolic panel shows hypokalemia potassium 3.4, slightly elevated glucose of 243 [JK]  1522 CBC with Differential(!) No leukocytosis or lactic acidosis [JK]  1522 X-ray shows edema versus atelectasis, cannot rule out infection [JK]    Clinical Course User Index [JK] Linwood Dibbles, MD                           Medical Decision Making Problems Addressed: COVID-19 virus infection: acute illness or injury that poses a threat to life or bodily functions Fever, unspecified fever cause: acute illness or injury Weakness: acute illness or injury  Amount and/or Complexity of Data Reviewed Labs: ordered. Decision-making details documented in ED Course. Radiology: ordered and independent interpretation performed. ECG/medicine tests: ordered.  Risk OTC drugs.   Patient presented to the ED for evaluation of increasing weakness.  Patient does have some chronic issues with weakness in his gait.  Normally is able to get up with some assistance and uses walker.  Today he was unable to stand.  Patient noted to have a fever of 101 today.  Concerned about the possibility of evolving sepsis.  No signs of any focal deficits as he is able to move all his extremities without difficulty when he is lying in the bed.  Patient however is unable to stand.  He has been having respiratory symptoms his COVID test is positive.  No signs of lactic acidosis or new oxygen requirement.  Doubt evolving sepsis at this time.  However with his weakness and covid infection I will consult the medical service for admission and further treatment.   Dr Hyacinth Meeker will follow up with hospitalist service.       Final Clinical Impression(s) / ED Diagnoses Final  diagnoses:  COVID-19 virus infection  Weakness  Fever, unspecified fever cause    Rx / DC Orders ED Discharge Orders      None         Linwood Dibbles, MD 03/04/22 [phone]

## 2022-03-04 NOTE — Assessment & Plan Note (Signed)
Mild potassium 3.4. -Replete -Check magnesium

## 2022-03-04 NOTE — Assessment & Plan Note (Signed)
-   Hgba1c - SSI- S -Not on medication
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
## 2022-03-04 NOTE — Assessment & Plan Note (Signed)
As yet unspecified type CHF.  Presenting with bilateral lower extremity swelling worse on the left.  Chest x-ray showing mild interstitial edema, pulmonary vascular congestion -suggesting CHF. -Lower extremity venous Dopplers -Obtain BNP -IV Lasix 40 mg twice daily x2 doses, reassess need for further diuretic -Obtain echocardiogram -Troponin x2

## 2022-03-04 NOTE — Assessment & Plan Note (Addendum)
Stable. -Resume HCTZ, lisinopril, metoprolol

## 2022-03-04 NOTE — ED Notes (Signed)
Both sets of blood cultures collected before any antibiotic administration

## 2022-03-04 NOTE — ED Triage Notes (Signed)
Pt arrived via [facility] c/o bilateral leg weakness since yesterday after physical therapy. Per EMS, pt also slipped out of bed, denies hitting head, takes baby aspirin, denies LOC. Biltateral ankle swelling for 2 years per EMS. Cbg 283, no Hx of diabetes

## 2022-03-04 NOTE — Progress Notes (Signed)
Incentive Spirometer and Flutter given to patient.  Usage and frequency explained.  Patient distributed back to RRT with IS at 1047ml achieved X10.  Flutter X10 and both left at bedside.

## 2022-03-04 NOTE — Assessment & Plan Note (Addendum)
Generalized weakness with fall.  Likely secondary to COVID-19 infection.  He has chronic dribbling and straining, that is unchanged, no dysuria or frequency, UA with moderate leukocytes.  Held off on antibiotics. -Head CT -Follow-up blood cultures ordered in ED.

## 2022-03-04 NOTE — Progress Notes (Signed)
Pt arrived to room 313 via stretcher from ED, moved by staff members x2 from stretcher to bed due to extreme weakness of pt. Cardiac monitor applied, SR 70-80/min, other VSS. Male wick applied due to urinary incontinence. Pt denies any SOB or other c/o. Family members at bedside. Pt and family advised of safety procedures and oriented to room. Bed alarm on for safety, call bell within reach.

## 2022-03-04 NOTE — Assessment & Plan Note (Signed)
Gotten at least 2 doses of COVID-19 vaccine.  No prior COVID-19 infection.  Chest x-ray suggest CHF.  On room air.  Reports cough but no dyspnea. -Obtain and trend inflammatory markers -Paxlovid -Check procalcitonin

## 2022-03-05 ENCOUNTER — Observation Stay (HOSPITAL_COMMUNITY): Payer: Medicare PPO

## 2022-03-05 DIAGNOSIS — E119 Type 2 diabetes mellitus without complications: Secondary | ICD-10-CM | POA: Diagnosis present

## 2022-03-05 DIAGNOSIS — I110 Hypertensive heart disease with heart failure: Secondary | ICD-10-CM | POA: Diagnosis present

## 2022-03-05 DIAGNOSIS — F05 Delirium due to known physiological condition: Secondary | ICD-10-CM | POA: Diagnosis not present

## 2022-03-05 DIAGNOSIS — I2510 Atherosclerotic heart disease of native coronary artery without angina pectoris: Secondary | ICD-10-CM | POA: Diagnosis present

## 2022-03-05 DIAGNOSIS — Z9181 History of falling: Secondary | ICD-10-CM | POA: Diagnosis not present

## 2022-03-05 DIAGNOSIS — N390 Urinary tract infection, site not specified: Secondary | ICD-10-CM | POA: Diagnosis present

## 2022-03-05 DIAGNOSIS — N401 Enlarged prostate with lower urinary tract symptoms: Secondary | ICD-10-CM | POA: Diagnosis present

## 2022-03-05 DIAGNOSIS — I5033 Acute on chronic diastolic (congestive) heart failure: Secondary | ICD-10-CM | POA: Diagnosis present

## 2022-03-05 DIAGNOSIS — Z950 Presence of cardiac pacemaker: Secondary | ICD-10-CM | POA: Diagnosis not present

## 2022-03-05 DIAGNOSIS — R339 Retention of urine, unspecified: Secondary | ICD-10-CM | POA: Diagnosis present

## 2022-03-05 DIAGNOSIS — E871 Hypo-osmolality and hyponatremia: Secondary | ICD-10-CM | POA: Diagnosis not present

## 2022-03-05 DIAGNOSIS — I509 Heart failure, unspecified: Secondary | ICD-10-CM

## 2022-03-05 DIAGNOSIS — W06XXXA Fall from bed, initial encounter: Secondary | ICD-10-CM | POA: Diagnosis present

## 2022-03-05 DIAGNOSIS — Z781 Physical restraint status: Secondary | ICD-10-CM | POA: Diagnosis not present

## 2022-03-05 DIAGNOSIS — N39498 Other specified urinary incontinence: Secondary | ICD-10-CM | POA: Diagnosis present

## 2022-03-05 DIAGNOSIS — E785 Hyperlipidemia, unspecified: Secondary | ICD-10-CM | POA: Diagnosis present

## 2022-03-05 DIAGNOSIS — R531 Weakness: Secondary | ICD-10-CM | POA: Diagnosis present

## 2022-03-05 DIAGNOSIS — Z951 Presence of aortocoronary bypass graft: Secondary | ICD-10-CM | POA: Diagnosis not present

## 2022-03-05 DIAGNOSIS — Z79899 Other long term (current) drug therapy: Secondary | ICD-10-CM | POA: Diagnosis not present

## 2022-03-05 DIAGNOSIS — I442 Atrioventricular block, complete: Secondary | ICD-10-CM | POA: Diagnosis present

## 2022-03-05 DIAGNOSIS — E876 Hypokalemia: Secondary | ICD-10-CM | POA: Diagnosis present

## 2022-03-05 DIAGNOSIS — Z8249 Family history of ischemic heart disease and other diseases of the circulatory system: Secondary | ICD-10-CM | POA: Diagnosis not present

## 2022-03-05 DIAGNOSIS — Y92009 Unspecified place in unspecified non-institutional (private) residence as the place of occurrence of the external cause: Secondary | ICD-10-CM | POA: Diagnosis not present

## 2022-03-05 DIAGNOSIS — I272 Pulmonary hypertension, unspecified: Secondary | ICD-10-CM | POA: Diagnosis present

## 2022-03-05 DIAGNOSIS — U071 COVID-19: Secondary | ICD-10-CM | POA: Diagnosis present

## 2022-03-05 DIAGNOSIS — R296 Repeated falls: Secondary | ICD-10-CM | POA: Diagnosis present

## 2022-03-05 LAB — URINALYSIS, ROUTINE W REFLEX MICROSCOPIC
Bilirubin Urine: NEGATIVE
Glucose, UA: 50 mg/dL — AB
Ketones, ur: NEGATIVE mg/dL
Nitrite: NEGATIVE
Protein, ur: 30 mg/dL — AB
Specific Gravity, Urine: 1.008 (ref 1.005–1.030)
pH: 6 (ref 5.0–8.0)

## 2022-03-05 LAB — COMPREHENSIVE METABOLIC PANEL
ALT: 16 U/L (ref 0–44)
AST: 23 U/L (ref 15–41)
Albumin: 3.3 g/dL — ABNORMAL LOW (ref 3.5–5.0)
Alkaline Phosphatase: 50 U/L (ref 38–126)
Anion gap: 9 (ref 5–15)
BUN: 22 mg/dL (ref 8–23)
CO2: 26 mmol/L (ref 22–32)
Calcium: 9.6 mg/dL (ref 8.9–10.3)
Chloride: 101 mmol/L (ref 98–111)
Creatinine, Ser: 0.96 mg/dL (ref 0.61–1.24)
GFR, Estimated: >60 mL/min (ref >60)
Glucose, Bld: 112 mg/dL — ABNORMAL HIGH (ref 70–99)
Potassium: 3.1 mmol/L — ABNORMAL LOW (ref 3.5–5.1)
Sodium: 136 mmol/L (ref 135–145)
Total Bilirubin: 1 mg/dL (ref 0.3–1.2)
Total Protein: 6 g/dL — ABNORMAL LOW (ref 6.5–8.1)

## 2022-03-05 LAB — ECHOCARDIOGRAM COMPLETE
Area-P 1/2: 4.83 cm2
Height: 67 in
S' Lateral: 3.6 cm
Weight: 2500.9 oz

## 2022-03-05 LAB — CBC WITH DIFFERENTIAL/PLATELET
Abs Immature Granulocytes: 0.04 10*3/uL (ref 0.00–0.07)
Basophils Absolute: 0 10*3/uL (ref 0.0–0.1)
Basophils Relative: 0 %
Eosinophils Absolute: 0 10*3/uL (ref 0.0–0.5)
Eosinophils Relative: 0 %
HCT: 41.3 % (ref 39.0–52.0)
Hemoglobin: 14 g/dL (ref 13.0–17.0)
Immature Granulocytes: 0 %
Lymphocytes Relative: 8 %
Lymphs Abs: 0.8 10*3/uL (ref 0.7–4.0)
MCH: 32 pg (ref 26.0–34.0)
MCHC: 33.9 g/dL (ref 30.0–36.0)
MCV: 94.5 fL (ref 80.0–100.0)
Monocytes Absolute: 1.4 10*3/uL — ABNORMAL HIGH (ref 0.1–1.0)
Monocytes Relative: 15 %
Neutro Abs: 7.2 10*3/uL (ref 1.7–7.7)
Neutrophils Relative %: 77 %
Platelets: 157 10*3/uL (ref 150–400)
RBC: 4.37 MIL/uL (ref 4.22–5.81)
RDW: 12.8 % (ref 11.5–15.5)
WBC: 9.4 10*3/uL (ref 4.0–10.5)
nRBC: 0 % (ref 0.0–0.2)

## 2022-03-05 LAB — PHOSPHORUS: Phosphorus: 2.3 mg/dL — ABNORMAL LOW (ref 2.5–4.6)

## 2022-03-05 LAB — HEMOGLOBIN A1C
Hgb A1c MFr Bld: 6.5 % — ABNORMAL HIGH (ref 4.8–5.6)
Mean Plasma Glucose: 139.85 mg/dL

## 2022-03-05 LAB — GLUCOSE, CAPILLARY
Glucose-Capillary: 169 mg/dL — ABNORMAL HIGH (ref 70–99)
Glucose-Capillary: 115 mg/dL — ABNORMAL HIGH (ref 70–99)
Glucose-Capillary: 175 mg/dL — ABNORMAL HIGH (ref 70–99)
Glucose-Capillary: 146 mg/dL — ABNORMAL HIGH (ref 70–99)
Glucose-Capillary: 166 mg/dL — ABNORMAL HIGH (ref 70–99)

## 2022-03-05 LAB — C-REACTIVE PROTEIN
CRP: 5.2 mg/dL — ABNORMAL HIGH (ref <1.0)
CRP: 8.8 mg/dL — ABNORMAL HIGH (ref <1.0)

## 2022-03-05 LAB — MAGNESIUM: Magnesium: 1.9 mg/dL (ref 1.7–2.4)

## 2022-03-05 LAB — D-DIMER, QUANTITATIVE: D-Dimer, Quant: 0.68 ug/mL-FEU — ABNORMAL HIGH (ref 0.00–0.50)

## 2022-03-05 LAB — FERRITIN: Ferritin: 174 ng/mL (ref 24–336)

## 2022-03-05 MED ORDER — MELATONIN 3 MG PO TABS
6.0000 mg | ORAL_TABLET | Freq: Once | ORAL | Status: AC
  Administered 2022-03-05: 6 mg via ORAL
  Filled 2022-03-05: qty 2

## 2022-03-05 MED ORDER — FUROSEMIDE 10 MG/ML IJ SOLN
20.0000 mg | Freq: Two times a day (BID) | INTRAMUSCULAR | Status: DC
  Administered 2022-03-05 – 2022-03-07 (×4): 20 mg via INTRAVENOUS
  Filled 2022-03-05 (×4): qty 2

## 2022-03-05 MED ORDER — LIVING BETTER WITH HEART FAILURE BOOK: Freq: Once | Status: AC

## 2022-03-05 MED ORDER — TAMSULOSIN HCL 0.4 MG PO CAPS
0.4000 mg | ORAL_CAPSULE | Freq: Every day | ORAL | Status: DC
  Administered 2022-03-05 – 2022-03-15 (×10): 0.4 mg via ORAL
  Filled 2022-03-05 (×11): qty 1

## 2022-03-05 MED ORDER — HYDRALAZINE HCL 20 MG/ML IJ SOLN: 10.0000 mg | Freq: Four times a day (QID) | INTRAMUSCULAR | Status: DC | PRN

## 2022-03-05 MED ORDER — SODIUM CHLORIDE 0.9 % IV SOLN
1.0000 g | INTRAVENOUS | Status: AC
  Administered 2022-03-05 – 2022-03-07 (×3): 1 g via INTRAVENOUS
  Filled 2022-03-05 (×3): qty 10

## 2022-03-05 MED ORDER — PANTOPRAZOLE SODIUM 40 MG PO TBEC
40.0000 mg | DELAYED_RELEASE_TABLET | Freq: Two times a day (BID) | ORAL | Status: DC
  Administered 2022-03-05 – 2022-03-14 (×19): 40 mg via ORAL
  Filled 2022-03-05 (×20): qty 1

## 2022-03-05 MED ORDER — POTASSIUM CHLORIDE CRYS ER 20 MEQ PO TBCR
40.0000 meq | EXTENDED_RELEASE_TABLET | ORAL | Status: AC
  Administered 2022-03-05 (×2): 40 meq via ORAL
  Filled 2022-03-05 (×2): qty 2

## 2022-03-05 MED ORDER — NIRMATRELVIR/RITONAVIR (PAXLOVID) TABLET (RENAL DOSING)
2.0000 | ORAL_TABLET | Freq: Two times a day (BID) | ORAL | Status: AC
  Administered 2022-03-05 – 2022-03-09 (×10): 2 via ORAL
  Filled 2022-03-05: qty 20

## 2022-03-05 MED ORDER — K PHOS MONO-SOD PHOS DI & MONO 155-852-130 MG PO TABS
250.0000 mg | ORAL_TABLET | Freq: Three times a day (TID) | ORAL | Status: AC
  Administered 2022-03-05 – 2022-03-07 (×6): 250 mg via ORAL
  Filled 2022-03-05 (×6): qty 1

## 2022-03-05 MED ORDER — GUAIFENESIN-DM 100-10 MG/5ML PO SYRP
5.0000 mL | ORAL_SOLUTION | ORAL | Status: DC | PRN
  Administered 2022-03-05 – 2022-03-08 (×5): 5 mL via ORAL
  Filled 2022-03-05 (×5): qty 5

## 2022-03-05 NOTE — Progress Notes (Signed)
PROGRESS NOTE     Guy Roberson, is a 88 y.o. male, DOB - 08-01-1931, HAL:[id number]  Admit date - 03/04/2022   Admitting Physician Guy Roys, MD  Outpatient Primary MD for the patient is Guy Evens, MD  LOS - 0  Chief Complaint  Patient presents with   Weakness        Brief Narrative:  88 y.o. male with medical history significant for DM, HTN, complete heart block with pacemaker,  BPH admitted on 03/04/2022 with urinary incontinence and dyspnea in the setting of COVID-19 infection  Late entry note patient was seen and evaluated/examined on 03/04/2022   -Assessment and Plan: 1) Generalized weakness/status post fall---Multifactorial etiology suspected in a patient with COVID-19 infection and CHF  -CT head without acute findings -Physical therapy eval requested -Fall precautions for now  2)COVID-19 virus infection -Previously vaccinated -Respiratory symptoms are not severe -Patient with significant weakness and malaise -Continue Paxlovid and bronchodilators -Significant hypoxia and chest x-ray is not suggestive of COVID-pneumonia---so hold off on steroids -Follow inflammatory markers  3)Acute congestive heart failure ([place]) ??  Systolic versus diastolic-- -echo pending at this time -Lower extremity edema and dyspnea noted--Dopplers without DVT -Chest x-ray consistent with CHF  -Continue Lasix -Daily weights and fluid input and output monitoring -Continue lisinopril and metoprolol  Intake/Output Summary (Last 24 hours) at 03/05/2022 1655 Last data filed at 03/05/2022 1300 Gross per 24 hour  Intake 480 ml  Output 2000 ml  Net -1520 ml   4)Hypokalemia/Hypophosphatemia -Magnesium WNL  Magner replace and recheck  5)Hypertension Stable. Continue metoprolol lisinopril okay to stop HCTZ -Lasix as ordered  6)DM (diabetes mellitus) ([place]) .  A1c 6.5 reflecting good diabetic control PTA -Use Novolog/Humalog Sliding scale insulin with Accu-Cheks/Fingersticks  as ordered   7) urinary retention--- Flomax as ordered  NB!!! Late entry note patient was seen and evaluated/examined on 03/04/2022  Disposition/Need for in-Hospital Stay- patient unable to be discharged at this time due to -*  Status is: Inpatient   Disposition: The patient is from: Home              Anticipated d/c is to: Home              Anticipated d/c date is: 2 days              Patient currently is not medically stable to d/c. Barriers: Not Clinically Stable-   Code Status :  -  Code Status: Partial Code   Family Communication:    NA (patient is alert, awake and coherent)   DVT Prophylaxis  :   - SCDs   enoxaparin (LOVENOX) injection 40 mg Start: 03/04/22 2045   Lab Results  Component Value Date   PLT 157 03/05/2022    Inpatient Medications  Scheduled Meds:  aspirin EC  81 mg Oral q morning   enoxaparin (LOVENOX) injection  40 mg Subcutaneous Q24H   furosemide  40 mg Intravenous Q12H   hydrochlorothiazide  25 mg Oral q morning   insulin aspart  0-5 Units Subcutaneous QHS   insulin aspart  0-9 Units Subcutaneous TID WC   lisinopril  20 mg Oral BID   LORazepam  2 mg Oral QHS   metoprolol tartrate  50 mg Oral BID   nirmatrelvir/ritonavir EUA (renal dosing)  2 tablet Oral BID   pantoprazole  40 mg Oral BID AC   Continuous Infusions: PRN Meds:.acetaminophen **OR** acetaminophen, guaiFENesin-dextromethorphan, polyethylene glycol, promethazine   Anti-infectives (From admission, onward)    Start  Dose/Rate Route Frequency Ordered Stop   03/05/22 1000  nirmatrelvir/ritonavir EUA (renal dosing) (PAXLOVID) 2 tablet        2 tablet Oral 2 times daily 03/05/22 0735 03/10/22 0959   03/04/22 2200  nirmatrelvir/ritonavir EUA (PAXLOVID) 3 tablet  Status:  Discontinued        3 tablet Oral 2 times daily 03/04/22 1927 03/05/22 0735         Subjective: Guy Roberson today has no fevers, no emesis,  No chest pain,   - Urinary incontinence persist -Weakness and  malaise persist -No productive cough  Late entry note patient was seen and evaluated/examined on 03/04/2022  Objective: Vitals:   03/04/22 1800 03/04/22 2117 03/05/22 0435 03/05/22 0500  BP:  (!) 147/94 (!) 155/101   Pulse: 70 91 67   Resp: (!) 24 18 18    Temp: 99.7 F (37.6 C) (!) 97.4 F (36.3 C) 98 F (36.7 C)   TempSrc: Oral     SpO2: 96% 100% 94%   Weight: 70.7 kg   70.9 kg  Height: 5\' 7"  (1.702 m)       Intake/Output Summary (Last 24 hours) at 03/05/2022 0740 Last data filed at 03/05/2022 0300 Gross per 24 hour  Intake 240 ml  Output 1400 ml  Net -1160 ml   Filed Weights   03/04/22 1221 03/04/22 1800 03/05/22 0500  Weight: 68.5 kg 70.7 kg 70.9 kg    Physical Exam Gen:- Awake Alert, no conversational dyspnea HEENT:- [place].AT, No sclera icterus Neck-Supple Neck,No JVD,.  Lungs-no wheezing, somewhat diminished on the right, CV- S1, S2 normal, regular , prior CABG scar, pacemaker in situ Abd-  +ve B.Sounds, Abd Soft, No tenderness,    Extremity/Skin:- +ve edema, pedal pulses present  Psych-affect is appropriate, oriented x3 Neuro-generalized weakness no new focal deficits, no tremors GU-urinary incontinence  Data Reviewed: I have personally reviewed following labs and imaging studies  CBC: Recent Labs  Lab 03/04/22 1344 03/05/22 0342  WBC 9.0 9.4  NEUTROABS 7.2 7.2  HGB 14.9 14.0  HCT 43.2 41.3  MCV 93.3 94.5  PLT 143* 157   Basic Metabolic Panel: Recent Labs  Lab 03/04/22 1344 03/05/22 0342  NA 136 136  K 3.4* 3.1*  CL 100 101  CO2 28 26  GLUCOSE 243* 112*  BUN 20 22  CREATININE 0.94 0.96  CALCIUM 9.8 9.6  MG  --  1.9  PHOS  --  2.3*   GFR: Estimated Creatinine Clearance: 50.7 mL/min (by C-G formula based on SCr of 0.96 mg/dL). Liver Function Tests: Recent Labs  Lab 03/04/22 1344 03/05/22 0342  AST 22 23  ALT 15 16  ALKPHOS 54 50  BILITOT 0.9 1.0  PROT 6.4* 6.0*  ALBUMIN 3.6 3.3*   Cardiac Enzymes: No results for input(s):  "CKTOTAL", "CKMB", "CKMBINDEX", "TROPONINI" in the last 168 hours. BNP (last 3 results) No results for input(s): "PROBNP" in the last 8760 hours. HbA1C: No results for input(s): "HGBA1C" in the last 72 hours. Sepsis Labs: @LABRCNTIP (procalcitonin:4,lacticidven:4) ) Recent Results (from the past 240 hour(s))  Blood Culture (routine x 2)     Status: None (Preliminary result)   Collection Time: 03/04/22 12:44 PM   Specimen: BLOOD  Result Value Ref Range Status   Specimen Description BLOOD RIGHT WRIST  Final   Special Requests   Final    BOTTLES DRAWN AEROBIC AND ANAEROBIC Blood Culture adequate volume   Culture   Final    NO GROWTH < 24 HOURS Performed at [facility]  [facility], [address], [place], [place] [address]    Report Status PENDING  Incomplete  SARS Coronavirus 2 by RT PCR (hospital order, performed in [facility] hospital lab) *cepheid single result test* Anterior Nasal Swab     Status: Abnormal   Collection Time: 03/04/22 12:58 PM   Specimen: Anterior Nasal Swab  Result Value Ref Range Status   SARS Coronavirus 2 by RT PCR POSITIVE (A) NEGATIVE Final    Comment: (NOTE) SARS-CoV-2 target nucleic acids are DETECTED  SARS-CoV-2 RNA is generally detectable in upper respiratory specimens  during the acute phase of infection.  Positive results are indicative  of the presence of the identified virus, but do not rule out bacterial infection or co-infection with other pathogens not detected by the test.  Clinical correlation with patient history and  other diagnostic information is necessary to determine patient infection status.  The expected result is negative.  Fact Sheet for Patients:   RoadLapTop.co.za   Fact Sheet for Healthcare Providers:   http://kim-miller.com/    This test is not yet approved or cleared by the [place] FDA and  has been authorized for detection and/or diagnosis of SARS-CoV-2 by FDA under an Emergency Use  Authorization (EUA).  This EUA will remain in effect (meaning this test can be used) for the duration of  the COVID-19 declaration under Section 564(b)(1)  of the Act, 21 U.S.C. section 360-bbb-3(b)(1), unless the authorization is terminated or revoked sooner.   Performed at [facility], [address] [place], [place] [address]   Blood Culture (routine x 2)     Status: None (Preliminary result)   Collection Time: 03/04/22  1:44 PM   Specimen: BLOOD  Result Value Ref Range Status   Specimen Description BLOOD LEFT WRIST  Final   Special Requests   Final    BOTTLES DRAWN AEROBIC AND ANAEROBIC Blood Culture results may not be optimal due to an excessive volume of blood received in culture bottles   Culture   Final    NO GROWTH < 24 HOURS Performed at [facility], [address], [place], [place] [address]    Report Status PENDING  Incomplete      Radiology Studies: CT HEAD WO CONTRAST ( )  Result Date: 03/04/2022 CLINICAL DATA:  Initial evaluation for un witnessed mechanical fall. EXAM: CT HEAD WITHOUT CONTRAST TECHNIQUE: Contiguous axial images were obtained from the base of the skull through the vertex without intravenous contrast. RADIATION DOSE REDUCTION: This exam was performed according to the departmental dose-optimization program which includes automated exposure control, adjustment of the mA and/or kV according to patient size and/or use of iterative reconstruction technique. COMPARISON:  Prior study from 09/09/2018. FINDINGS: Brain: Examination degraded by motion artifact. Generalized age-related cerebral atrophy with chronic microvascular ischemic disease. No acute intracranial hemorrhage. No acute large vessel territory infarct. No mass lesion or midline shift. No hydrocephalus or extra-axial fluid collection. Vascular: No abnormal hyperdense vessel. Calcified atherosclerosis present at the skull base. Skull: No scalp soft tissue injury.  Calvarium intact. Sinuses/Orbits:  Globes orbital soft tissues demonstrate no acute finding. Scattered mucosal thickening noted about the ethmoidal air cells. No mastoid effusion. Other: None. IMPRESSION: 1. No acute intracranial abnormality. 2. Generalized age-related cerebral atrophy with chronic microvascular ischemic disease. Electronically Signed   By: Rise Mu M.D.   On: 03/04/2022 21:34   DG Chest Port 1 View  Result Date: 03/04/2022 CLINICAL DATA:  Possible sepsis. Leg weakness yesterday after therapy. Lower extremity edema. EXAM: PORTABLE  CHEST 1 VIEW COMPARISON:  Two-view chest x-ray 09/09/2018 FINDINGS: Heart size upper limits of normal. Atherosclerotic calcifications are present at the aorta. Patient is status post median sternotomy for CABG. Pacing wires are stable. Mild interstitial edema is present. Moderate pulmonary vascular congestion is noted. Airspace opacity is present along the minor fissure. And in the right lower lobe. Volume loss is evident on the right. No other focal airspace disease is present. Degenerative changes are present in the shoulders bilaterally. IMPRESSION: 1. Mild interstitial edema and moderate pulmonary vascular congestion suggesting congestive heart failure. 2. Airspace opacity along the minor fissure and in the right lower lobe likely reflects atelectasis. Infection is not excluded. Electronically Signed   By: Marin Roberts M.D.   On: 03/04/2022 13:16     Scheduled Meds:  aspirin EC  81 mg Oral q morning   enoxaparin (LOVENOX) injection  40 mg Subcutaneous Q24H   furosemide  40 mg Intravenous Q12H   hydrochlorothiazide  25 mg Oral q morning   insulin aspart  0-5 Units Subcutaneous QHS   insulin aspart  0-9 Units Subcutaneous TID WC   lisinopril  20 mg Oral BID   LORazepam  2 mg Oral QHS   metoprolol tartrate  50 mg Oral BID   nirmatrelvir/ritonavir EUA (renal dosing)  2 tablet Oral BID   pantoprazole  40 mg Oral BID AC   Continuous Infusions:   LOS: 0 days     Shon Hale M.D on 03/05/2022 at 7:40 AM  Go to [email or web address] - for contact info  [facility] - Office  [phone]  If 7PM-7AM, please contact night-coverage [email or web address] 03/05/2022, 7:40 AM

## 2022-03-05 NOTE — Progress Notes (Signed)
  Echocardiogram 2D Echocardiogram has been performed.  Guy Roberson M 03/05/2022, 12:44 PM

## 2022-03-05 NOTE — TOC Progression Note (Signed)
  Transition of Care [facility]) Screening Note   Patient Details  Name: Guy Roberson Date of Birth: Nov 25, 1931   Transition of Care [facility]) CM/SW Contact:    Shade Flood, LCSW Phone Number: 03/05/2022, 11:14 AM    CHF educational materials ordered for delivery to pt bedside.  Transition of Care Department [facility]) has reviewed patient and no TOC needs have been identified at this time. We will continue to monitor patient advancement through interdisciplinary progression rounds. If new patient transition needs arise, please place a TOC consult.

## 2022-03-05 NOTE — Progress Notes (Signed)
Provider notified of troponin 103, no new orders, will continue to trend

## 2022-03-05 NOTE — Progress Notes (Signed)
PROGRESS NOTE     Guy Roberson, is a 88 y.o. male, DOB - 22-Aug-1931, WUJ:[id number]  Admit date - 03/04/2022   Admitting Physician Lamees Gable Denton Brick, MD  Outpatient Primary MD for the patient is [name], MD  LOS - 0  Chief Complaint  Patient presents with   Weakness        Brief Narrative:  88 y.o. male with medical history significant for DM, HTN, complete heart block with pacemaker,  BPH admitted on 03/04/2022 with urinary incontinence and dyspnea in the setting of COVID-19 infection   Plan: 1) Generalized weakness/status post fall---Multifactorial etiology suspected in a patient with COVID-19 infection and CHF  -CT head without acute findings -Physical therapy eval requested -Fall precautions for now  2)COVID-19 virus infection -Previously vaccinated -Respiratory symptoms are not severe -Patient with significant weakness and malaise -Continue Paxlovid and bronchodilators -Significant hypoxia and chest x-ray is not suggestive of COVID-pneumonia---so hold off on steroids -Follow inflammatory markers COVID-19 Labs  Recent Labs    03/04/22 1344 03/05/22 0342  DDIMER 0.68* 0.68*  FERRITIN 120 174  LDH 165  --   CRP 5.2* 8.8*   3)Acute congestive heart failure ([place]) ??  Systolic versus diastolic-- -echo pending at this time -Lower extremity edema and dyspnea noted--Dopplers without DVT -Chest x-ray consistent with CHF  -Continue Lasix -Daily weights and fluid input and output monitoring -Continue lisinopril and metoprolol  Intake/Output Summary (Last 24 hours) at 03/05/2022 1709 Last data filed at 03/05/2022 1300 Gross per 24 hour  Intake 480 ml  Output 2000 ml  Net -1520 ml    4)Hypokalemia/Hypophosphatemia -Magnesium WNL  Magner replace and recheck  5)Hypertension Stable. Continue metoprolol lisinopril okay to stop HCTZ -Lasix as ordered  6)DM (diabetes mellitus) ([place]) .  A1c 6.5 reflecting good diabetic control PTA -Use Novolog/Humalog  Sliding scale insulin with Accu-Cheks/Fingersticks as ordered   7) urinary retention--- Flomax as ordered  8) possible UTI----patient complains of urinary symptoms -UA suggestive of UTI -Treat empirically with IV Rocephin pending culture data  Disposition/Need for in-Hospital Stay- patient unable to be discharged at this time due to - -CHF requiring IV diuretics  Status is: Inpatient   Disposition: The patient is from: Home              Anticipated d/c is to: Home              Anticipated d/c date is: 1 day              Patient currently is not medically stable to d/c. Barriers: Not Clinically Stable-   Code Status :  -  Code Status: Partial Code   Family Communication:    NA (patient is alert, awake and coherent)   DVT Prophylaxis  :   - SCDs   enoxaparin (LOVENOX) injection 40 mg Start: 03/04/22 2045   Lab Results  Component Value Date   PLT 157 03/05/2022    Inpatient Medications  Scheduled Meds:  aspirin EC  81 mg Oral q morning   enoxaparin (LOVENOX) injection  40 mg Subcutaneous Q24H   furosemide  20 mg Intravenous Q12H   insulin aspart  0-5 Units Subcutaneous QHS   insulin aspart  0-9 Units Subcutaneous TID WC   lisinopril  20 mg Oral BID   LORazepam  2 mg Oral QHS   metoprolol tartrate  50 mg Oral BID   nirmatrelvir/ritonavir EUA (renal dosing)  2 tablet Oral BID   pantoprazole  40 mg Oral BID AC  phosphorus  250 mg Oral TID   tamsulosin  0.4 mg Oral QPC supper   Continuous Infusions: PRN Meds:.acetaminophen **OR** acetaminophen, guaiFENesin-dextromethorphan, hydrALAZINE, polyethylene glycol, promethazine   Anti-infectives (From admission, onward)    Start     Dose/Rate Route Frequency Ordered Stop   03/05/22 1000  nirmatrelvir/ritonavir EUA (renal dosing) (PAXLOVID) 2 tablet        2 tablet Oral 2 times daily 03/05/22 0735 03/10/22 0959   03/04/22 2200  nirmatrelvir/ritonavir EUA (PAXLOVID) 3 tablet  Status:  Discontinued        3 tablet Oral 2 times  daily 03/04/22 1927 03/05/22 0735         Subjective: Guy Roberson today has no fevers, no emesis,  No chest pain,    -Complains of urinary discomfort/dysuria -No Flank pain  Objective: Vitals:   03/05/22 0500 03/05/22 1300 03/05/22 1500 03/05/22 1619  BP:  (!) 161/80 (!) 161/94 (!) 154/83  Pulse:  65 68 65  Resp:  (!) 28 (!) 24 (!) 22  Temp:  97.9 F (36.6 C)    TempSrc:  Oral    SpO2:  98% 97% 95%  Weight: 70.9 kg     Height:        Intake/Output Summary (Last 24 hours) at 03/05/2022 1703 Last data filed at 03/05/2022 1300 Gross per 24 hour  Intake 480 ml  Output 2000 ml  Net -1520 ml   Filed Weights   03/04/22 1221 03/04/22 1800 03/05/22 0500  Weight: 68.5 kg 70.7 kg 70.9 kg    Physical Exam Gen:- Awake Alert, no conversational dyspnea HEENT:- [place].AT, No sclera icterus Neck-Supple Neck,No JVD,.  Lungs-no wheezing, somewhat diminished on the right, CV- S1, S2 normal, regular , prior CABG scar, pacemaker in situ Abd-  +ve B.Sounds, Abd Soft, No tenderness,    Extremity/Skin:-Improving edema, pedal pulses present  Psych-affect is appropriate, oriented x3 Neuro-generalized weakness no new focal deficits, no tremors GU-urinary incontinence  Data Reviewed: I have personally reviewed following labs and imaging studies  CBC: Recent Labs  Lab 03/04/22 1344 03/05/22 0342  WBC 9.0 9.4  NEUTROABS 7.2 7.2  HGB 14.9 14.0  HCT 43.2 41.3  MCV 93.3 94.5  PLT 143* 157   Basic Metabolic Panel: Recent Labs  Lab 03/04/22 1344 03/05/22 0342  NA 136 136  K 3.4* 3.1*  CL 100 101  CO2 28 26  GLUCOSE 243* 112*  BUN 20 22  CREATININE 0.94 0.96  CALCIUM 9.8 9.6  MG  --  1.9  PHOS  --  2.3*   GFR: Estimated Creatinine Clearance: 50.7 mL/min (by C-G formula based on SCr of 0.96 mg/dL). Liver Function Tests: Recent Labs  Lab 03/04/22 1344 03/05/22 0342  AST 22 23  ALT 15 16  ALKPHOS 54 50  BILITOT 0.9 1.0  PROT 6.4* 6.0*  ALBUMIN 3.6 3.3*   HbA1C: Recent  Labs    03/05/22 0342  HGBA1C 6.5*   Recent Results (from the past 240 hour(s))  Blood Culture (routine x 2)     Status: None (Preliminary result)   Collection Time: 03/04/22 12:44 PM   Specimen: BLOOD  Result Value Ref Range Status   Specimen Description BLOOD RIGHT WRIST  Final   Special Requests   Final    BOTTLES DRAWN AEROBIC AND ANAEROBIC Blood Culture adequate volume   Culture   Final    NO GROWTH < 24 HOURS Performed at [facility], [address] [place], [place] [address]    Report  Status PENDING  Incomplete  SARS Coronavirus 2 by RT PCR (hospital order, performed in [facility] hospital lab) *cepheid single result test* Anterior Nasal Swab     Status: Abnormal   Collection Time: 03/04/22 12:58 PM   Specimen: Anterior Nasal Swab  Result Value Ref Range Status   SARS Coronavirus 2 by RT PCR POSITIVE (A) NEGATIVE Final    Comment: (NOTE) SARS-CoV-2 target nucleic acids are DETECTED  SARS-CoV-2 RNA is generally detectable in upper respiratory specimens  during the acute phase of infection.  Positive results are indicative  of the presence of the identified virus, but do not rule out bacterial infection or co-infection with other pathogens not detected by the test.  Clinical correlation with patient history and  other diagnostic information is necessary to determine patient infection status.  The expected result is negative.  Fact Sheet for Patients:   RoadLapTop.co.za   Fact Sheet for Healthcare Providers:   http://kim-miller.com/    This test is not yet approved or cleared by the [place] FDA and  has been authorized for detection and/or diagnosis of SARS-CoV-2 by FDA under an Emergency Use Authorization (EUA).  This EUA will remain in effect (meaning this test can be used) for the duration of  the COVID-19 declaration under Section 564(b)(1)  of the Act, 21 U.S.C. section 360-bbb-3(b)(1), unless the authorization  is terminated or revoked sooner.   Performed at [facility], [address] [place], [place] [address]   Blood Culture (routine x 2)     Status: None (Preliminary result)   Collection Time: 03/04/22  1:44 PM   Specimen: BLOOD  Result Value Ref Range Status   Specimen Description BLOOD LEFT WRIST  Final   Special Requests   Final    BOTTLES DRAWN AEROBIC AND ANAEROBIC Blood Culture results may not be optimal due to an excessive volume of blood received in culture bottles   Culture   Final    NO GROWTH < 24 HOURS Performed at [facility], [address] [place], [place] [address]    Report Status PENDING  Incomplete      Radiology Studies: US Venous Img Lower Unilateral Left (DVT)  Result Date: 03/05/2022 CLINICAL DATA:  Left lower extremity swelling, shortness of breath EXAM: LEFT LOWER EXTREMITY VENOUS DOPPLER ULTRASOUND TECHNIQUE: Gray-scale sonography with compression, as well as color and duplex ultrasound, were performed to evaluate the deep venous system(s) from the level of the common femoral vein through the popliteal and proximal calf veins. COMPARISON:  None Available. FINDINGS: VENOUS Normal compressibility of the common femoral, superficial femoral, and popliteal veins, as well as the visualized calf veins. Visualized portions of profunda femoral vein and great saphenous vein unremarkable. No filling defects to suggest DVT on grayscale or color Doppler imaging. Doppler waveforms show normal direction of venous flow, normal respiratory plasticity and response to augmentation. Limited views of the contralateral common femoral vein are unremarkable. OTHER None. Limitations: none IMPRESSION: Negative. Electronically Signed   By: Malachy Moan M.D.   On: 03/05/2022 11:41   CT HEAD WO CONTRAST ( )  Result Date: 03/04/2022 CLINICAL DATA:  Initial evaluation for un witnessed mechanical fall. EXAM: CT HEAD WITHOUT CONTRAST TECHNIQUE: Contiguous axial images were obtained from  the base of the skull through the vertex without intravenous contrast. RADIATION DOSE REDUCTION: This exam was performed according to the departmental dose-optimization program which includes automated exposure control, adjustment of the mA and/or kV according to patient size and/or use of iterative reconstruction  technique. COMPARISON:  Prior study from 09/09/2018. FINDINGS: Brain: Examination degraded by motion artifact. Generalized age-related cerebral atrophy with chronic microvascular ischemic disease. No acute intracranial hemorrhage. No acute large vessel territory infarct. No mass lesion or midline shift. No hydrocephalus or extra-axial fluid collection. Vascular: No abnormal hyperdense vessel. Calcified atherosclerosis present at the skull base. Skull: No scalp soft tissue injury.  Calvarium intact. Sinuses/Orbits: Globes orbital soft tissues demonstrate no acute finding. Scattered mucosal thickening noted about the ethmoidal air cells. No mastoid effusion. Other: None. IMPRESSION: 1. No acute intracranial abnormality. 2. Generalized age-related cerebral atrophy with chronic microvascular ischemic disease. Electronically Signed   By: Rise Mu M.D.   On: 03/04/2022 21:34   DG Chest Port 1 View  Result Date: 03/04/2022 CLINICAL DATA:  Possible sepsis. Leg weakness yesterday after therapy. Lower extremity edema. EXAM: PORTABLE CHEST 1 VIEW COMPARISON:  Two-view chest x-ray 09/09/2018 FINDINGS: Heart size upper limits of normal. Atherosclerotic calcifications are present at the aorta. Patient is status post median sternotomy for CABG. Pacing wires are stable. Mild interstitial edema is present. Moderate pulmonary vascular congestion is noted. Airspace opacity is present along the minor fissure. And in the right lower lobe. Volume loss is evident on the right. No other focal airspace disease is present. Degenerative changes are present in the shoulders bilaterally. IMPRESSION: 1. Mild  interstitial edema and moderate pulmonary vascular congestion suggesting congestive heart failure. 2. Airspace opacity along the minor fissure and in the right lower lobe likely reflects atelectasis. Infection is not excluded. Electronically Signed   By: Marin Roberts M.D.   On: 03/04/2022 13:16     Scheduled Meds:  aspirin EC  81 mg Oral q morning   enoxaparin (LOVENOX) injection  40 mg Subcutaneous Q24H   furosemide  20 mg Intravenous Q12H   insulin aspart  0-5 Units Subcutaneous QHS   insulin aspart  0-9 Units Subcutaneous TID WC   lisinopril  20 mg Oral BID   LORazepam  2 mg Oral QHS   metoprolol tartrate  50 mg Oral BID   nirmatrelvir/ritonavir EUA (renal dosing)  2 tablet Oral BID   pantoprazole  40 mg Oral BID AC   phosphorus  250 mg Oral TID   tamsulosin  0.4 mg Oral QPC supper   Continuous Infusions:   LOS: 0 days    Shon Hale M.D on 03/05/2022 at 5:03 PM  Go to [email or web address] - for contact info  [facility] - Office  [phone]  If 7PM-7AM, please contact night-coverage [email or web address] 03/05/2022, 5:03 PM

## 2022-03-06 DIAGNOSIS — R531 Weakness: Secondary | ICD-10-CM | POA: Diagnosis not present

## 2022-03-06 LAB — COMPREHENSIVE METABOLIC PANEL
ALT: 18 U/L (ref 0–44)
AST: 28 U/L (ref 15–41)
Albumin: 3.2 g/dL — ABNORMAL LOW (ref 3.5–5.0)
Alkaline Phosphatase: 50 U/L (ref 38–126)
Anion gap: 12 (ref 5–15)
BUN: 24 mg/dL — ABNORMAL HIGH (ref 8–23)
CO2: 30 mmol/L (ref 22–32)
Calcium: 9.4 mg/dL (ref 8.9–10.3)
Chloride: 92 mmol/L — ABNORMAL LOW (ref 98–111)
Creatinine, Ser: 1.04 mg/dL (ref 0.61–1.24)
GFR, Estimated: >60 mL/min (ref >60)
Glucose, Bld: 109 mg/dL — ABNORMAL HIGH (ref 70–99)
Potassium: 2.9 mmol/L — ABNORMAL LOW (ref 3.5–5.1)
Sodium: 134 mmol/L — ABNORMAL LOW (ref 135–145)
Total Bilirubin: 1.1 mg/dL (ref 0.3–1.2)
Total Protein: 6 g/dL — ABNORMAL LOW (ref 6.5–8.1)

## 2022-03-06 LAB — CBC WITH DIFFERENTIAL/PLATELET
Abs Immature Granulocytes: 0.02 10*3/uL (ref 0.00–0.07)
Basophils Absolute: 0 10*3/uL (ref 0.0–0.1)
Basophils Relative: 0 %
Eosinophils Absolute: 0.1 10*3/uL (ref 0.0–0.5)
Eosinophils Relative: 1 %
HCT: 41.2 % (ref 39.0–52.0)
Hemoglobin: 14.2 g/dL (ref 13.0–17.0)
Immature Granulocytes: 0 %
Lymphocytes Relative: 16 %
Lymphs Abs: 1.1 10*3/uL (ref 0.7–4.0)
MCH: 31.7 pg (ref 26.0–34.0)
MCHC: 34.5 g/dL (ref 30.0–36.0)
MCV: 92 fL (ref 80.0–100.0)
Monocytes Absolute: 1 10*3/uL (ref 0.1–1.0)
Monocytes Relative: 15 %
Neutro Abs: 4.7 10*3/uL (ref 1.7–7.7)
Neutrophils Relative %: 68 %
Platelets: 137 10*3/uL — ABNORMAL LOW (ref 150–400)
RBC: 4.48 MIL/uL (ref 4.22–5.81)
RDW: 12.8 % (ref 11.5–15.5)
WBC: 6.9 10*3/uL (ref 4.0–10.5)
nRBC: 0 % (ref 0.0–0.2)

## 2022-03-06 LAB — C-REACTIVE PROTEIN: CRP: 9.2 mg/dL — ABNORMAL HIGH (ref <1.0)

## 2022-03-06 LAB — GLUCOSE, CAPILLARY
Glucose-Capillary: 136 mg/dL — ABNORMAL HIGH (ref 70–99)
Glucose-Capillary: 158 mg/dL — ABNORMAL HIGH (ref 70–99)
Glucose-Capillary: 118 mg/dL — ABNORMAL HIGH (ref 70–99)
Glucose-Capillary: 201 mg/dL — ABNORMAL HIGH (ref 70–99)

## 2022-03-06 LAB — MAGNESIUM: Magnesium: 1.8 mg/dL (ref 1.7–2.4)

## 2022-03-06 LAB — URINE CULTURE

## 2022-03-06 LAB — D-DIMER, QUANTITATIVE: D-Dimer, Quant: 0.53 ug/mL-FEU — ABNORMAL HIGH (ref 0.00–0.50)

## 2022-03-06 LAB — PHOSPHORUS: Phosphorus: 3.2 mg/dL (ref 2.5–4.6)

## 2022-03-06 LAB — FERRITIN: Ferritin: 328 ng/mL (ref 24–336)

## 2022-03-06 MED ORDER — POTASSIUM CHLORIDE CRYS ER 20 MEQ PO TBCR
40.0000 meq | EXTENDED_RELEASE_TABLET | ORAL | Status: AC
  Administered 2022-03-06 (×2): 40 meq via ORAL
  Filled 2022-03-06 (×2): qty 2

## 2022-03-06 NOTE — TOC Initial Note (Signed)
Transition of Care [facility]) - Initial/Assessment Note    Patient Details  Name: Guy Roberson MRN: [id number] Date of Birth: 06-02-1931  [facility]) CM/SW Contact:    Elliot Gault, LCSW Phone Number: 03/06/2022, 2:30 PM  Clinical Narrative:                  PT recommending SNF rehab for pt. Spoke with pt today to assess and review dc planning. Per pt, he was receiving HH PT [facility]) prior to admission. Reviewed SNF recommendation with pt who stated that he prefers to return home and have the home therapy continue. Pt lives with his wife and he uses a walker for ambulation. Pt's son and DIL are supportive and involved. Spoke with pt's DIL who states that she and pt's son agree that pt would benefit from SNF rehab and they will try to speak with him about the idea of giving it a try. If pt does decide that he wants SNF, his admission to SNF could be delayed due to COVID diagnosis.  TOC will follow and continue to assist with dc planning.  Expected Discharge Plan: Home w Home Health Services Barriers to Discharge: Continued Medical Work up   Patient Goals and CMS Choice Patient states their goals for this hospitalization and ongoing recovery are:: go home CMS Medicare.gov Compare Post Acute Care list provided to:: Patient Choice offered to / list presented to : Patient  Expected Discharge Plan and Services Expected Discharge Plan: Home w Home Health Services In-house Referral: Clinical Social Work   Post Acute Care Choice: Resumption of Svcs/PTA Provider Living arrangements for the past 2 months: Single Family Home                                      Prior Living Arrangements/Services Living arrangements for the past 2 months: Single Family Home Lives with:: Spouse Patient language and need for interpreter reviewed:: Yes Do you feel safe going back to the place where you live?: Yes      Need for Family Participation in Patient Care: Yes (Comment) Care  giver support system in place?: Yes (comment) Current home services: DME Criminal Activity/Legal Involvement Pertinent to Current Situation/Hospitalization: No - Comment as needed  Activities of Daily Living Home Assistive Devices/Equipment: Walker (specify type) ADL Screening (condition at time of admission) Patient's cognitive ability adequate to safely complete daily activities?: Yes Is the patient deaf or have difficulty hearing?: Yes Does the patient have difficulty seeing, even when wearing glasses/contacts?: No Does the patient have difficulty concentrating, remembering, or making decisions?: No Patient able to express need for assistance with ADLs?: Yes Does the patient have difficulty dressing or bathing?: Yes Independently performs ADLs?: No Communication: Independent Dressing (OT): Independent Grooming: Independent Feeding: Independent Bathing: Needs assistance Is this a change from baseline?: Pre-admission baseline Toileting: Independent In/Out Bed: Independent Walks in Home: Independent Does the patient have difficulty walking or climbing stairs?: Yes Weakness of Legs: Both Weakness of Arms/Hands: None  Permission Sought/Granted                  Emotional Assessment   Attitude/Demeanor/Rapport: Engaged Affect (typically observed): Pleasant Orientation: : Oriented to Self, Oriented to Place, Oriented to  Time, Oriented to Situation Alcohol / Substance Use: Not Applicable Psych Involvement: No (comment)  Admission diagnosis:  Weakness [R53.1] Generalized weakness [R53.1] Fever, unspecified fever cause [R50.9] COVID-19 virus infection [  U07.1] Acute exacerbation of CHF (congestive heart failure) ([place]) [I50.9] Patient Active Problem List   Diagnosis Date Noted   Acute exacerbation of CHF (congestive heart failure) ([place]) 03/05/2022   Generalized weakness 03/04/2022   COVID-19 virus infection 03/04/2022   Acute congestive heart failure ([place]) 03/04/2022    Hypokalemia 03/04/2022   Gross hematuria 07/07/2021   Benign prostatic hyperplasia with urinary retention 07/07/2021   Bladder diverticulum 07/07/2021   Neurogenic bladder 07/07/2021   Urinary retention 01/01/2012    Class: Present on Admission   Pacemaker-[employer] 12/30/2011   Acute respiratory failure ([place]) 12/29/2011   Rib fracture 12/29/2011   Hypoxemia 12/29/2011   Syncope    Complete heart block ([facility])    Hypertension    Hyperlipidemia    CAD (coronary artery disease)    Arteriosclerotic cardiovascular disease (ASCVD)    Fasting hyperglycemia    Colonic polyp    DM (diabetes mellitus) ([place]) 10/02/2009   HYPERLIPIDEMIA 09/30/2009   Essential hypertension 09/30/2009   PCP:  [name], MD Pharmacy:   [facility], [place] [address] Phone: [phone] Fax: [phone]     Social Determinants of Health (SDOH) Interventions    Readmission Risk Interventions    03/06/2022    2:29 PM  Readmission Risk Prevention Plan  Transportation Screening Complete  Home Care Screening Complete  Medication Review (RN CM) Complete

## 2022-03-06 NOTE — Progress Notes (Signed)
PROGRESS NOTE     Guy Roberson, is a 88 y.o. male, DOB - Dec 12, 1931, [id number]  Admit date - 03/04/2022   Admitting Physician Guy Cuoco Denton Brick, MD  Outpatient Primary MD for the patient is Guy Evens, MD  LOS - 1  Chief Complaint  Patient presents with   Weakness        Brief Narrative:  88 y.o. male with medical history significant for DM, HTN, complete heart block with pacemaker,  BPH admitted on 03/04/2022 with urinary incontinence and dyspnea in the setting of COVID-19 infection   Plan: 1) Generalized weakness/status post fall---Multifactorial etiology suspected in a patient with COVID-19 infection and CHF  -CT head without acute findings --Pt Was hoping to go home however unable to walk safely with physical therapy and did very poorly with ambulation with family at bedside.... Very high fall risk -Patient reluctantly agrees to go to SNF rehab -Physical therapy eval appreciated recommends SNF rehab -SNF facility requires patient to be at least 10 days out from [place] diagnosis prior to be admitted to SNF --Fall precautions for now  2)COVID-19 virus infection -Previously vaccinated -Respiratory symptoms are not severe -Patient with significant weakness and malaise -Continue Paxlovid and bronchodilators -No Significant hypoxia and chest x-ray is not suggestive of COVID-pneumonia---so hold off on steroids -Follow inflammatory markers COVID-19 Labs  Recent Labs    03/04/22 1344 03/05/22 0342 03/06/22 0428  DDIMER 0.68* 0.68* 0.53*  FERRITIN 120 174 328  LDH 165  --   --   CRP 5.2* 8.8* 9.2*   3)HFpEF--Acute on chronic diastolic congestive heart failure (HCC) -Echo with preserved EF of 50 to 55% with severe asymmetric LVH and mild pulmonary hypertension -No aortic stenosis -Lower extremity edema and dyspnea improving with diuresis-Dopplers without DVT -Admission chest x-ray was consistent with CHF  -Continue Lasix -Daily weights and fluid input and output  monitoring -Continue lisinopril and metoprolol  4)Hypokalemia/Hypophosphatemia -Magnesium WNL  -Phosphorous is normalized with replacement -Potassium still low will give further potassium replacements  5)Hypertension Stable. Continue metoprolol lisinopril okay to stop HCTZ -Lasix as ordered  6)DM (diabetes mellitus) ([place]) .  A1c 6.5 reflecting good diabetic control PTA -Use Novolog/Humalog Sliding scale insulin with Accu-Cheks/Fingersticks as ordered   7) urinary retention--- c/n  Flomax    8) possible UTI----patient complains of urinary symptoms -UA suggestive of UTI -Treat empirically with IV Rocephin pending culture data  9) thrombocytopenia---??  Related to infection  Disposition/Need for in-Hospital Stay- patient unable to be discharged at this time due to - -CHF requiring IV diuretics -Generalized weakness and falls at home--- awaiting transfer to SNF rehab --SNF facility requires patient to be at least 10 days out from COVID diagnosis prior to be admitted to SNF  Status is: Inpatient   Disposition: The patient is from: Home              Anticipated d/c is to: Home              Anticipated d/c date is: 1 day              Patient currently is not medically stable to d/c. Barriers: Not Clinically Stable-   Code Status :  -  Code Status: Partial Code   Family Communication:    NA (patient is alert, awake and coherent)   DVT Prophylaxis  :   - SCDs   enoxaparin (LOVENOX) injection 40 mg Start: 03/04/22 2045  Lab Results  Component Value Date   PLT 137 (L) 03/06/2022  Inpatient Medications  Scheduled Meds:  aspirin EC  81 mg Oral q morning   enoxaparin (LOVENOX) injection  40 mg Subcutaneous Q24H   furosemide  20 mg Intravenous Q12H   insulin aspart  0-5 Units Subcutaneous QHS   insulin aspart  0-9 Units Subcutaneous TID WC   lisinopril  20 mg Oral BID   LORazepam  2 mg Oral QHS   metoprolol tartrate  50 mg Oral BID   nirmatrelvir/ritonavir EUA (renal  dosing)  2 tablet Oral BID   pantoprazole  40 mg Oral BID AC   phosphorus  250 mg Oral TID   tamsulosin  0.4 mg Oral QPC supper   Continuous Infusions:  cefTRIAXone (ROCEPHIN)  IV 1 g (03/06/22 1728)   PRN Meds:.acetaminophen **OR** acetaminophen, guaiFENesin-dextromethorphan, hydrALAZINE, polyethylene glycol, promethazine   Anti-infectives (From admission, onward)    Start     Dose/Rate Route Frequency Ordered Stop   03/05/22 1800  cefTRIAXone (ROCEPHIN) 1 g in sodium chloride 0.9 % 100 mL IVPB        1 g 200 mL/hr over 30 Minutes Intravenous Every 24 hours 03/05/22 1711     03/05/22 1000  nirmatrelvir/ritonavir EUA (renal dosing) (PAXLOVID) 2 tablet        2 tablet Oral 2 times daily 03/05/22 0735 03/10/22 0959   03/04/22 2200  nirmatrelvir/ritonavir EUA (PAXLOVID) 3 tablet  Status:  Discontinued        3 tablet Oral 2 times daily 03/04/22 1927 03/05/22 0735         Subjective: Guy Roberson today has no fevers, no emesis,  No chest pain,    -Resting comfortably -Pt Was hoping to go home however unable to walk safely with physical therapy and with family at bedside.... Very  high fall risk -Patient reluctantly agrees to go to SNF rehab  Objective: Vitals:   03/06/22 0859 03/06/22 0900 03/06/22 1503 03/06/22 1739  BP: (!) 145/64  (!) 154/83   Pulse: 78 78 84   Resp:   17   Temp: 97.7 F (36.5 C)   97.6 F (36.4 C)  TempSrc: Oral   Axillary  SpO2:   97%   Weight:      Height:        Intake/Output Summary (Last 24 hours) at 03/06/2022 1812 Last data filed at 03/06/2022 0900 Gross per 24 hour  Intake 340 ml  Output --  Net 340 ml   Filed Weights   03/04/22 1221 03/04/22 1800 03/05/22 0500  Weight: 68.5 kg 70.7 kg 70.9 kg    Physical Exam Gen:- Awake Alert, no conversational dyspnea HEENT:- [place].AT, No sclera icterus Neck-Supple Neck,No JVD,.  Lungs-no wheezing, somewhat diminished on the right, CV- S1, S2 normal, regular , prior CABG scar, pacemaker in  situ Abd-  +ve B.Sounds, Abd Soft, No tenderness,    Extremity/Skin:-Improving edema, pedal pulses present  Psych-affect is appropriate, oriented x3 Neuro-generalized weakness no new focal deficits, no tremors GU-urinary incontinence  Data Reviewed: I have personally reviewed following labs and imaging studies  CBC: Recent Labs  Lab 03/04/22 1344 03/05/22 0342 03/06/22 0428  WBC 9.0 9.4 6.9  NEUTROABS 7.2 7.2 4.7  HGB 14.9 14.0 14.2  HCT 43.2 41.3 41.2  MCV 93.3 94.5 92.0  PLT 143* 157 947*   Basic Metabolic Panel: Recent Labs  Lab 03/04/22 1344 03/05/22 0342 03/06/22 0428  NA 136 136 134*  K 3.4* 3.1* 2.9*  CL 100 101 92*  CO2 28 26 30   GLUCOSE 243* 112*  109*  BUN 20 22 24*  CREATININE 0.94 0.96 1.04  CALCIUM 9.8 9.6 9.4  MG  --  1.9 1.8  PHOS  --  2.3* 3.2   GFR: Estimated Creatinine Clearance: 46.8 mL/min (by C-G formula based on SCr of 1.04 mg/dL). Liver Function Tests: Recent Labs  Lab 03/04/22 1344 03/05/22 0342 03/06/22 0428  AST 22 23 28   ALT 15 16 18   ALKPHOS 54 50 50  BILITOT 0.9 1.0 1.1  PROT 6.4* 6.0* 6.0*  ALBUMIN 3.6 3.3* 3.2*   HbA1C: Recent Labs    03/05/22 0342  HGBA1C 6.5*   Recent Results (from the past 240 hour(s))  Blood Culture (routine x 2)     Status: None (Preliminary result)   Collection Time: 03/04/22 12:44 PM   Specimen: BLOOD  Result Value Ref Range Status   Specimen Description BLOOD RIGHT WRIST  Final   Special Requests   Final    BOTTLES DRAWN AEROBIC AND ANAEROBIC Blood Culture adequate volume   Culture   Final    NO GROWTH 2 DAYS Performed at [facility], [address] [place], [place] [address]    Report Status PENDING  Incomplete  SARS Coronavirus 2 by RT PCR (hospital order, performed in [place] hospital lab) *cepheid single result test* Anterior Nasal Swab     Status: Abnormal   Collection Time: 03/04/22 12:58 PM   Specimen: Anterior Nasal Swab  Result Value Ref Range Status   SARS Coronavirus 2  by RT PCR POSITIVE (A) NEGATIVE Final    Comment: (NOTE) SARS-CoV-2 target nucleic acids are DETECTED  SARS-CoV-2 RNA is generally detectable in upper respiratory specimens  during the acute phase of infection.  Positive results are indicative  of the presence of the identified virus, but do not rule out bacterial infection or co-infection with other pathogens not detected by the test.  Clinical correlation with patient history and  other diagnostic information is necessary to determine patient infection status.  The expected result is negative.  Fact Sheet for Patients:   https://www.patel.info/   Fact Sheet for Healthcare Providers:   https://hall.com/    This test is not yet approved or cleared by the [place] FDA and  has been authorized for detection and/or diagnosis of SARS-CoV-2 by FDA under an Emergency Use Authorization (EUA).  This EUA will remain in effect (meaning this test can be used) for the duration of  the COVID-19 declaration under Section 564(b)(1)  of the Act, 21 U.S.C. section 360-bbb-3(b)(1), unless the authorization is terminated or revoked sooner.   Performed at [facility], [address] [place], [place] [address]   Blood Culture (routine x 2)     Status: None (Preliminary result)   Collection Time: 03/04/22  1:44 PM   Specimen: BLOOD  Result Value Ref Range Status   Specimen Description BLOOD LEFT WRIST  Final   Special Requests   Final    BOTTLES DRAWN AEROBIC AND ANAEROBIC Blood Culture results may not be optimal due to an excessive volume of blood received in culture bottles   Culture   Final    NO GROWTH 2 DAYS Performed at [facility] [address] [place],  [address]    Report Status PENDING  Incomplete      Radiology Studies: ECHOCARDIOGRAM COMPLETE  Result Date: 03/05/2022    ECHOCARDIOGRAM REPORT   Patient Name:   LAM HICKEN Date of Exam: 03/05/2022 Medical Rec #:  [id number]      Height:  67.0 in Accession #:    [id number]    Weight:       156.3 lb Date of Birth:  [birth year]    BSA:          1.821 m Patient Age:    42 years      BP:           155/101 mmHg Patient Gender: M             HR:           63 bpm. Exam Location:  Inpatient Procedure: 2D Echo, 3D Echo, Cardiac Doppler and Color Doppler Indications:    CHF  History:        Patient has no prior history of Echocardiogram examinations.                 Pacemaker, Arrythmias:Complete Heart Block; Risk                 Factors:Diabetes, Dyslipidemia and Hypertension. COVID 19.  Sonographer:    Darlina Sicilian RDCS Referring Phys: [phone] [facility]  Sonographer Comments: Suboptimal subcostal window. Echo performed with patient sitting upright in chair for comfort. IMPRESSIONS  1. Hypokinesis of the mid/distal inferoseptal wall. Left ventricular ejection fraction, by estimation, is 50 to 55%. The left ventricle has low normal function. There is severe asymmetric left ventricular hypertrophy. Left ventricular diastolic parameters are indeterminate.  2. Right ventricular systolic function is mildly reduced. The right ventricular size is normal. There is mildly elevated pulmonary artery systolic pressure.  3. Mild mitral valve regurgitation.  4. The aortic valve is tricuspid. Aortic valve regurgitation is mild. Aortic valve sclerosis is present, with no evidence of aortic valve stenosis. FINDINGS  Left Ventricle: Hypokinesis of the mid/distal inferoseptal wall. Left ventricular ejection fraction, by estimation, is 50 to 55%. The left ventricle has low normal function. The left ventricular internal cavity size was normal in size. There is severe asymmetric left ventricular hypertrophy. Left ventricular diastolic parameters are indeterminate. Right Ventricle: The right ventricular size is normal. Right vetricular wall thickness was not assessed. Right ventricular systolic function is mildly reduced. There is mildly elevated  pulmonary artery systolic pressure. The tricuspid regurgitant velocity is 2.99 m/s, and with an assumed right atrial pressure of 8 mmHg, the estimated right ventricular systolic pressure is [id number] mmHg. Left Atrium: Left atrial size was normal in size. Right Atrium: Right atrial size was normal in size. Pericardium: There is no evidence of pericardial effusion. Mitral Valve: There is mild thickening of the mitral valve leaflet(s). Mild mitral annular calcification. Mild mitral valve regurgitation. Tricuspid Valve: The tricuspid valve is normal in structure. Tricuspid valve regurgitation is mild. Aortic Valve: The aortic valve is tricuspid. Aortic valve regurgitation is mild. Aortic valve sclerosis is present, with no evidence of aortic valve stenosis. Pulmonic Valve: The pulmonic valve was normal in structure. Pulmonic valve regurgitation is mild. Aorta: The aortic root and ascending aorta are structurally normal, with no evidence of dilitation. Venous: The inferior vena cava was not well visualized. IAS/Shunts: No atrial level shunt detected by color flow Doppler. Additional Comments: A device lead is visualized.  LEFT VENTRICLE PLAX 2D LVIDd:         4.70 cm   Diastology LVIDs:         3.60 cm   LV e' medial:    3.64 cm/s LV PW:         0.80 cm   LV E/e' medial:  28.3 LV IVS:  1.70 cm   LV e' lateral:   7.83 cm/s LVOT diam:     1.80 cm   LV E/e' lateral: 13.2 LV SV:         42 LV SV Index:   23 LVOT Area:     2.54 cm                           3D Volume EF:                          3D EF:        52 %                          LV EDV:       132 ml                          LV ESV:       64 ml                          LV SV:        68 ml RIGHT VENTRICLE RV S prime:     9.34 cm/s TAPSE (M-mode): 1.3 cm LEFT ATRIUM             Index        RIGHT ATRIUM           Index LA diam:        4.80 cm 2.64 cm/m   RA Area:     13.60 cm LA Vol (A2C):   56.9 ml 31.24 ml/m  RA Volume:   30.30 ml  16.64 ml/m LA Vol (A4C):    60.4 ml 33.17 ml/m LA Biplane Vol: 60.3 ml 33.11 ml/m  AORTIC VALVE             PULMONIC VALVE LVOT Vmax:   78.30 cm/s  PR End Diast Vel: 6.66 msec LVOT Vmean:  48.200 cm/s LVOT VTI:    0.167 m  AORTA Ao Root diam: 3.20 cm Ao Asc diam:  3.10 cm MITRAL VALVE                TRICUSPID VALVE MV Area (PHT): 4.83 cm     TR Peak grad:   35.8 mmHg MV Decel Time: 157 msec     TR Vmax:        299.00 cm/s MV E velocity: 103.00 cm/s MV A velocity: 99.80 cm/s   SHUNTS MV E/A ratio:  1.03         Systemic VTI:  0.17 m                             Systemic Diam: 1.80 cm Dorris Carnes MD Electronically signed by Dorris Carnes MD Signature Date/Time: 03/05/2022/5:07:38 PM    Final    US Venous Img Lower Unilateral Left (DVT)  Result Date: 03/05/2022 CLINICAL DATA:  Left lower extremity swelling, shortness of breath EXAM: LEFT LOWER EXTREMITY VENOUS DOPPLER ULTRASOUND TECHNIQUE: Gray-scale sonography with compression, as well as color and duplex ultrasound, were performed to evaluate the deep venous system(s) from the level of the common femoral vein through the popliteal and proximal calf veins. COMPARISON:  None Available. FINDINGS: VENOUS Normal compressibility of the common femoral, superficial femoral,  and popliteal veins, as well as the visualized calf veins. Visualized portions of profunda femoral vein and great saphenous vein unremarkable. No filling defects to suggest DVT on grayscale or color Doppler imaging. Doppler waveforms show normal direction of venous flow, normal respiratory plasticity and response to augmentation. Limited views of the contralateral common femoral vein are unremarkable. OTHER None. Limitations: none IMPRESSION: Negative. Electronically Signed   By: Jacqulynn Cadet M.D.   On: 03/05/2022 11:41   CT HEAD WO CONTRAST (5MM)  Result Date: 03/04/2022 CLINICAL DATA:  Initial evaluation for un witnessed mechanical fall. EXAM: CT HEAD WITHOUT CONTRAST TECHNIQUE: Contiguous axial images were obtained from  the base of the skull through the vertex without intravenous contrast. RADIATION DOSE REDUCTION: This exam was performed according to the departmental dose-optimization program which includes automated exposure control, adjustment of the mA and/or kV according to patient size and/or use of iterative reconstruction technique. COMPARISON:  Prior study from 09/09/2018. FINDINGS: Brain: Examination degraded by motion artifact. Generalized age-related cerebral atrophy with chronic microvascular ischemic disease. No acute intracranial hemorrhage. No acute large vessel territory infarct. No mass lesion or midline shift. No hydrocephalus or extra-axial fluid collection. Vascular: No abnormal hyperdense vessel. Calcified atherosclerosis present at the skull base. Skull: No scalp soft tissue injury.  Calvarium intact. Sinuses/Orbits: Globes orbital soft tissues demonstrate no acute finding. Scattered mucosal thickening noted about the ethmoidal air cells. No mastoid effusion. Other: None. IMPRESSION: 1. No acute intracranial abnormality. 2. Generalized age-related cerebral atrophy with chronic microvascular ischemic disease. Electronically Signed   By: Jeannine Boga M.D.   On: 03/04/2022 21:34     Scheduled Meds:  aspirin EC  81 mg Oral q morning   enoxaparin (LOVENOX) injection  40 mg Subcutaneous Q24H   furosemide  20 mg Intravenous Q12H   insulin aspart  0-5 Units Subcutaneous QHS   insulin aspart  0-9 Units Subcutaneous TID WC   lisinopril  20 mg Oral BID   LORazepam  2 mg Oral QHS   metoprolol tartrate  50 mg Oral BID   nirmatrelvir/ritonavir EUA (renal dosing)  2 tablet Oral BID   pantoprazole  40 mg Oral BID AC   phosphorus  250 mg Oral TID   tamsulosin  0.4 mg Oral QPC supper   Continuous Infusions:  cefTRIAXone (ROCEPHIN)  IV 1 g (03/06/22 1728)     LOS: 1 day   Roxan Hockey M.D on 03/06/2022 at 6:12 PM  Go to [email or web address] - for contact info  [facility] - Office   [phone]  If 7PM-7AM, please contact night-coverage [email or web address] 03/06/2022, 6:12 PM

## 2022-03-06 NOTE — Evaluation (Signed)
Physical Therapy Evaluation Patient Details Name: Guy Roberson MRN: [id number] DOB: [birth year] Today's Date: 03/06/2022  History of Present Illness  Guy Roberson is a 88 y.o. male with medical history significant for DM, HTN, complete heart block with pacemaker, urinary artery disease, BPH.  Patient was to the ED via EMS reports of generalized weakness, inability to ambulate.  Symptoms started about 2 days ago with cough.  This morning he was so weak he was unable to stand.  At baseline he uses a walker, he got out of bed this morning, normally his legs do not reach the floor when he is sitting on his bed, and on attempting to stand he slid off the bed to the floor.  He does not know if he hit his head.  Fall was unwitnessed.  He denies difficulty breathing.  He has chronic straining and dribbling otherwise no dysuria or frequency.  He had an episode of fecal incontinence yesterday.  No vomiting.  He got at least 2 shots maybe 3 of the COVID-vaccine.  He has not had COVID infection before.  He has had lower extremity swelling over the past several weeks to months.   Clinical Impression  Patient limited for functional mobility as stated below secondary to BLE weakness, fatigue and impaired standing balance. Patient seated in chair at beginning of session. He demonstrates generalized weakness requiring assist and use of RW to transfer to standing and ambulate. Patient ambulates with slow, unsteady cadence with RW and is given cueing for proper RW use. Patient returned to chair at end of session. Patient will benefit from continued physical therapy in hospital and recommended venue below to increase strength, balance, endurance for safe ADLs and gait.        Recommendations for follow up therapy are one component of a multi-disciplinary discharge planning process, led by the attending physician.  Recommendations may be updated based on patient status, additional functional criteria and insurance  authorization.  Follow Up Recommendations Skilled nursing-short term rehab (<3 hours/day) Can patient physically be transported by private vehicle: Yes    Assistance Recommended at Discharge Intermittent Supervision/Assistance  Patient can return home with the following  A little help with walking and/or transfers;A little help with bathing/dressing/bathroom;Assistance with cooking/housework;Help with stairs or ramp for entrance;Assist for transportation    Equipment Recommendations None recommended by PT  Recommendations for Other Services       Functional Status Assessment Patient has had a recent decline in their functional status and demonstrates the ability to make significant improvements in function in a reasonable and predictable amount of time.     Precautions / Restrictions Precautions Precautions: Fall Restrictions Weight Bearing Restrictions: No      Mobility  Bed Mobility               General bed mobility comments: seated in chair at beginning of session    Transfers Overall transfer level: Needs assistance Equipment used: Rolling walker (2 wheels) Transfers: Sit to/from Stand Sit to Stand: Min assist           General transfer comment: labored, requires assist to power up to standing with RW    Ambulation/Gait Ambulation/Gait assistance: Min assist Gait Distance (Feet): 10 Feet Assistive device: Rolling walker (2 wheels) Gait Pattern/deviations: Decreased stride length, Wide base of support Gait velocity: decreased     General Gait Details: slow, unsteady cadence in room with [employer]  Mobility    Modified Rankin (Stroke Patients Only)       Balance Overall balance assessment: Needs assistance Sitting-balance support: Feet supported, No upper extremity supported Sitting balance-Leahy Scale: Good Sitting balance - Comments: seated in chair   Standing balance support: Bilateral upper extremity  supported, Reliant on assistive device for balance Standing balance-Leahy Scale: Fair Standing balance comment: with RW                             Pertinent Vitals/Pain Pain Assessment Pain Assessment: No/denies pain    Home Living Family/patient expects to be discharged to:: Private residence Living Arrangements: Spouse/significant other Available Help at Discharge: Family Type of Home: House Home Access: Ramped entrance       Home Layout: One level Home Equipment: Rollator (4 wheels)      Prior Function Prior Level of Function : Independent/Modified Independent             Mobility Comments: states household ambulation with rollator ADLs Comments: able to complete basic ADL but very difficult to do     Hand Dominance        Extremity/Trunk Assessment   Upper Extremity Assessment Upper Extremity Assessment: Generalized weakness    Lower Extremity Assessment Lower Extremity Assessment: Generalized weakness       Communication   Communication: HOH  Cognition Arousal/Alertness: Awake/alert Behavior During Therapy: WFL for tasks assessed/performed Overall Cognitive Status: Within Functional Limits for tasks assessed                                          General Comments      Exercises     Assessment/Plan    PT Assessment Patient needs continued PT services  PT Problem List Decreased strength;Decreased mobility;Decreased activity tolerance;Decreased balance;Decreased knowledge of use of DME       PT Treatment Interventions DME instruction;Therapeutic exercise;Gait training;Balance training;Stair training;Neuromuscular re-education;Functional mobility training;Therapeutic activities;Patient/family education    PT Goals (Current goals can be found in the Care Plan section)  Acute Rehab PT Goals Patient Stated Goal: return home PT Goal Formulation: With patient Time For Goal Achievement: 03/20/22 Potential to  Achieve Goals: Fair    Frequency Min 3X/week     Co-evaluation               AM-PAC PT "6 Clicks" Mobility  Outcome Measure Help needed turning from your back to your side while in a flat bed without using bedrails?: A Little Help needed moving from lying on your back to sitting on the side of a flat bed without using bedrails?: A Little Help needed moving to and from a bed to a chair (including a wheelchair)?: A Little Help needed standing up from a chair using your arms (e.g., wheelchair or bedside chair)?: A Little Help needed to walk in hospital room?: A Lot Help needed climbing 3-5 steps with a railing? : A Lot 6 Click Score: 16    End of Session Equipment Utilized During Treatment: Gait belt Activity Tolerance: Patient tolerated treatment well Patient left: in chair;with chair alarm set;with call bell/phone within reach Nurse Communication: Mobility status PT Visit Diagnosis: Unsteadiness on feet (R26.81);Other abnormalities of gait and mobility (R26.89);Muscle weakness (generalized) (M62.81);History of falling (Z91.81)    Time: 8270-7867 PT Time Calculation (min) (ACUTE ONLY): 14 min   Charges:   PT  Evaluation $PT Eval Moderate Complexity: 1 Mod PT Treatments $Therapeutic Activity: 8-22 mins        9:48 AM, 03/06/22 Wyman Songster PT, DPT Physical Therapist at [facility]

## 2022-03-06 NOTE — Care Management Important Message (Signed)
Important Message  Patient Details  Name: Guy Roberson MRN: [id number] Date of Birth: [birth year]   Medicare Important Message Given:  Yes (spoke with wife Eain Mullendore at [phone] to reveiw letter, no addditional copy needed)     Tommy Medal 03/06/2022, 1:23 PM

## 2022-03-06 NOTE — Plan of Care (Signed)
  Problem: Acute Rehab PT Goals(only PT should resolve) Goal: Patient Will Transfer Sit To/From Stand Outcome: Progressing Flowsheets (Taken 03/06/2022 [phone]) Patient will transfer sit to/from stand:  with supervision  with min guard assist Goal: Pt Will Transfer Bed To Chair/Chair To Bed Outcome: Progressing Flowsheets (Taken 03/06/2022 0951) Pt will Transfer Bed to Chair/Chair to Bed:  with supervision  min guard assist Goal: Pt Will Ambulate Outcome: Progressing Flowsheets (Taken 03/06/2022 0951) Pt will Ambulate:  50 feet  with min guard assist  with rolling walker Goal: Pt/caregiver will Perform Home Exercise Program Outcome: Progressing Flowsheets (Taken 03/06/2022 0951) Pt/caregiver will Perform Home Exercise Program:  For increased strengthening  For improved balance  With Supervision, verbal cues required/provided  9:51 AM, 03/06/22 Mearl Latin PT, DPT Physical Therapist at [facility]

## 2022-03-07 ENCOUNTER — Inpatient Hospital Stay (HOSPITAL_COMMUNITY): Payer: Medicare PPO

## 2022-03-07 DIAGNOSIS — R531 Weakness: Secondary | ICD-10-CM | POA: Diagnosis not present

## 2022-03-07 LAB — CBC WITH DIFFERENTIAL/PLATELET
Abs Immature Granulocytes: 0.03 10*3/uL (ref 0.00–0.07)
Basophils Absolute: 0 10*3/uL (ref 0.0–0.1)
Basophils Relative: 0 %
Eosinophils Absolute: 0 10*3/uL (ref 0.0–0.5)
Eosinophils Relative: 1 %
HCT: 42.3 % (ref 39.0–52.0)
Hemoglobin: 14.9 g/dL (ref 13.0–17.0)
Immature Granulocytes: 0 %
Lymphocytes Relative: 16 %
Lymphs Abs: 1.1 10*3/uL (ref 0.7–4.0)
MCH: 31.6 pg (ref 26.0–34.0)
MCHC: 35.2 g/dL (ref 30.0–36.0)
MCV: 89.8 fL (ref 80.0–100.0)
Monocytes Absolute: 0.8 10*3/uL (ref 0.1–1.0)
Monocytes Relative: 12 %
Neutro Abs: 4.7 10*3/uL (ref 1.7–7.7)
Neutrophils Relative %: 71 %
Platelets: 166 10*3/uL (ref 150–400)
RBC: 4.71 MIL/uL (ref 4.22–5.81)
RDW: 12.5 % (ref 11.5–15.5)
WBC: 6.7 10*3/uL (ref 4.0–10.5)
nRBC: 0 % (ref 0.0–0.2)

## 2022-03-07 LAB — COMPREHENSIVE METABOLIC PANEL
ALT: 22 U/L (ref 0–44)
AST: 43 U/L — ABNORMAL HIGH (ref 15–41)
Albumin: 3.5 g/dL (ref 3.5–5.0)
Alkaline Phosphatase: 55 U/L (ref 38–126)
Anion gap: 14 (ref 5–15)
BUN: 25 mg/dL — ABNORMAL HIGH (ref 8–23)
CO2: 27 mmol/L (ref 22–32)
Calcium: 9.2 mg/dL (ref 8.9–10.3)
Chloride: 90 mmol/L — ABNORMAL LOW (ref 98–111)
Creatinine, Ser: 0.9 mg/dL (ref 0.61–1.24)
GFR, Estimated: >60 mL/min (ref >60)
Glucose, Bld: 105 mg/dL — ABNORMAL HIGH (ref 70–99)
Potassium: 2.7 mmol/L — CL (ref 3.5–5.1)
Sodium: 131 mmol/L — ABNORMAL LOW (ref 135–145)
Total Bilirubin: 1.2 mg/dL (ref 0.3–1.2)
Total Protein: 6.3 g/dL — ABNORMAL LOW (ref 6.5–8.1)

## 2022-03-07 LAB — MAGNESIUM: Magnesium: 1.6 mg/dL — ABNORMAL LOW (ref 1.7–2.4)

## 2022-03-07 LAB — RENAL FUNCTION PANEL
Albumin: 3.6 g/dL (ref 3.5–5.0)
Anion gap: 14 (ref 5–15)
BUN: 26 mg/dL — ABNORMAL HIGH (ref 8–23)
CO2: 27 mmol/L (ref 22–32)
Calcium: 9.2 mg/dL (ref 8.9–10.3)
Chloride: 90 mmol/L — ABNORMAL LOW (ref 98–111)
Creatinine, Ser: 0.93 mg/dL (ref 0.61–1.24)
GFR, Estimated: >60 mL/min (ref >60)
Glucose, Bld: 105 mg/dL — ABNORMAL HIGH (ref 70–99)
Phosphorus: 2.5 mg/dL (ref 2.5–4.6)
Potassium: 2.8 mmol/L — ABNORMAL LOW (ref 3.5–5.1)
Sodium: 131 mmol/L — ABNORMAL LOW (ref 135–145)

## 2022-03-07 LAB — PHOSPHORUS: Phosphorus: 2.5 mg/dL (ref 2.5–4.6)

## 2022-03-07 LAB — GLUCOSE, CAPILLARY
Glucose-Capillary: 109 mg/dL — ABNORMAL HIGH (ref 70–99)
Glucose-Capillary: 177 mg/dL — ABNORMAL HIGH (ref 70–99)
Glucose-Capillary: 146 mg/dL — ABNORMAL HIGH (ref 70–99)
Glucose-Capillary: 130 mg/dL — ABNORMAL HIGH (ref 70–99)

## 2022-03-07 LAB — FERRITIN: Ferritin: 535 ng/mL — ABNORMAL HIGH (ref 24–336)

## 2022-03-07 LAB — C-REACTIVE PROTEIN: CRP: 4.7 mg/dL — ABNORMAL HIGH (ref <1.0)

## 2022-03-07 LAB — D-DIMER, QUANTITATIVE: D-Dimer, Quant: 0.79 ug/mL-FEU — ABNORMAL HIGH (ref 0.00–0.50)

## 2022-03-07 MED ORDER — MAGNESIUM SULFATE 2 GM/50ML IV SOLN
2.0000 g | Freq: Once | INTRAVENOUS | Status: AC
  Administered 2022-03-07: 2 g via INTRAVENOUS
  Filled 2022-03-07: qty 50

## 2022-03-07 MED ORDER — HALOPERIDOL LACTATE 5 MG/ML IJ SOLN: 5.0000 mg | Freq: Once | INTRAMUSCULAR | Status: AC

## 2022-03-07 MED ORDER — MELATONIN 3 MG PO TABS
6.0000 mg | ORAL_TABLET | Freq: Once | ORAL | Status: AC
  Administered 2022-03-07: 6 mg via ORAL
  Filled 2022-03-07: qty 2

## 2022-03-07 MED ORDER — LORAZEPAM 1 MG PO TABS
1.0000 mg | ORAL_TABLET | Freq: Every evening | ORAL | Status: DC | PRN
  Administered 2022-03-12: 1 mg via ORAL
  Filled 2022-03-07: qty 1

## 2022-03-07 MED ORDER — POTASSIUM CHLORIDE 10 MEQ/100ML IV SOLN
10.0000 meq | INTRAVENOUS | Status: AC
  Administered 2022-03-07 (×3): 10 meq via INTRAVENOUS
  Filled 2022-03-07: qty 100

## 2022-03-07 MED ORDER — TRAZODONE HCL 50 MG PO TABS
100.0000 mg | ORAL_TABLET | Freq: Every day | ORAL | Status: AC
  Administered 2022-03-07 – 2022-03-09 (×3): 100 mg via ORAL
  Filled 2022-03-07 (×3): qty 2

## 2022-03-07 MED ORDER — ENSURE ENLIVE PO LIQD
237.0000 mL | Freq: Two times a day (BID) | ORAL | Status: DC
  Administered 2022-03-08 – 2022-03-16 (×13): 237 mL via ORAL

## 2022-03-07 MED ORDER — HALOPERIDOL LACTATE 5 MG/ML IJ SOLN
5.0000 mg | Freq: Once | INTRAMUSCULAR | Status: AC
  Administered 2022-03-07: 5 mg via INTRAMUSCULAR
  Filled 2022-03-07: qty 1

## 2022-03-07 MED ORDER — LORAZEPAM 2 MG/ML IJ SOLN
1.0000 mg | Freq: Once | INTRAMUSCULAR | Status: AC
  Administered 2022-03-07: 1 mg via INTRAVENOUS
  Filled 2022-03-07: qty 1

## 2022-03-07 MED ORDER — LORAZEPAM 2 MG/ML IJ SOLN: 0.5000 mg | Freq: Once | INTRAMUSCULAR | Status: DC

## 2022-03-07 MED ORDER — FUROSEMIDE 10 MG/ML IJ SOLN
20.0000 mg | Freq: Every day | INTRAMUSCULAR | Status: DC
  Administered 2022-03-08 – 2022-03-11 (×4): 20 mg via INTRAVENOUS
  Filled 2022-03-07 (×4): qty 2

## 2022-03-07 MED ORDER — HALOPERIDOL LACTATE 5 MG/ML IJ SOLN
INTRAMUSCULAR | Status: AC
  Administered 2022-03-07: 5 mg via INTRAMUSCULAR
  Filled 2022-03-07: qty 1

## 2022-03-07 NOTE — Progress Notes (Signed)
Attempted to contact patients son with no answer, called  and spoke with daughter in law Rodena Piety to make her aware of patients fall.

## 2022-03-07 NOTE — Progress Notes (Signed)
   03/07/22 0138  What Happened  Was fall witnessed? Yes  Who witnessed fall?  Dietrich Pates W. -entering room when patient fell)  Patients activity before fall ambulating-unassisted  Point of contact arm/shoulder;other (comment) (hands, abdomen)  Was patient injured? Yes  Patient found on floor  Found by Staff-comment Fabio Asa., Cliffton Asters, Kristain Filo Wataru Mccowen)  Stated prior activity to/from bed, chair, or stretcher  Follow Up  MD notified MD Josephine Cables  Time MD notified [phone]  Simple treatment Dressing  Adult Fall Risk Assessment  Risk Factor Category (scoring not indicated) Fall has occurred during this admission (document High fall risk)  Patient Fall Risk Level High fall risk  Adult Fall Risk Interventions  Required Bundle Interventions *See Row Information* High fall risk - low, moderate, and high requirements implemented  Additional Interventions Room near nurses station;Use of appropriate toileting equipment (bedpan, BSC, etc.)  Screening for Fall Injury Risk (To be completed on HIGH fall risk patients) - Assessing Need for Floor Mats  Risk For Fall Injury- Criteria for Floor Mats 85 years or older;Confusion/dementia (+NuDESC, CIWA, TBI, etc.);Previous fall this admission  Will Implement Floor Mats Yes  Vitals  BP (!) 155/78  MAP (mmHg) 97  BP Method Automatic  Pulse Rate 76  Pulse Rate Source Monitor  Oxygen Therapy  SpO2 97 %  O2 Device Room Air  Pain Assessment  Pain Scale 0-10  Pain Score 0  PCA/Epidural/Spinal Assessment  Respiratory Pattern Regular;Unlabored  Neurological  Neuro (WDL) X  Level of Consciousness Alert  Orientation Level Oriented to person;Disoriented to time;Disoriented to situation;Disoriented to place  Cognition Impulsive;Poor attention/concentration;Poor judgement;Poor safety awareness;Memory impairment  Speech Clear  Neuro Symptoms Forgetful;Tremors  Glasgow Coma Scale  Eye Opening 4  Best Verbal Response (NON-intubated) 4  Best Motor Response 6   Glasgow Coma Scale Score 14  Musculoskeletal  Musculoskeletal (WDL) X  Assistive Device None  Generalized Weakness Yes  Integumentary  Integumentary (WDL) X  Abrasion Location Arm;Hand  Abrasion Location Orientation Right;Left  Abrasion Intervention Cleansed;Foam

## 2022-03-07 NOTE — Progress Notes (Signed)
SNF placement is needed once 11 day incubation is complete.  Vang Kraeger Tarpley-Carter, MSW, LCSW-A Pronouns:  She/Her/Hers [facility] HealthTransitions of Care Clinical Social Worker Direct Number:  [phone] [email or web address]
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
## 2022-03-07 NOTE — Progress Notes (Signed)
Date and time results received: 03/07/22 1135 (use smartphrase ".now" to insert current time)  Test: K+ Critical Value: 2.7  Name of Provider Notified: Dr. Joesph Fillers  Orders Received? Or Actions Taken?:  Awaiting orders.

## 2022-03-07 NOTE — Progress Notes (Addendum)
PROGRESS NOTE     Guy Roberson, is a 88 y.o. male, DOB - 1931-10-24, ZDG:[id number]  Admit date - 03/04/2022   Admitting Physician Guy Riccobono Mariea Clonts, MD  Outpatient Primary MD for the patient is Guy Morgan, MD  LOS - 2  Chief Complaint  Patient presents with   Weakness        Brief Narrative:  88 y.o. male with medical history significant for DM, HTN, complete heart block with pacemaker,  BPH admitted on 03/04/2022 with urinary incontinence and dyspnea in the setting of COVID-19 infection   Plan: 1) Generalized weakness/status post fall---Multifactorial etiology suspected in a patient with COVID-19 infection and CHF  -CT head without acute findings --Pt Was hoping to go home however unable to walk safely with physical therapy and did very poorly with ambulation with family at bedside.... Very high fall risk -Patient reluctantly agrees to go to SNF rehab -Physical therapy eval appreciated recommends SNF rehab -SNF facility requires patient to be at least 10 days out from COVID diagnosis prior to be admitted to SNF (dxed 03/04/22) -Recurrent falls---PTA pt lived alone and did very poorly, patient has significant limitations with mobility related ADLs- this patient needs to continue to be monitored in the hospital until a SNF bed is obtained as she is not safe to go home with her current physcical limitations -- Patient continues to have recurrent falls even in the  hospital 03/07/22 -Episode of fall overnight confused with delirium----suspect some component of sundowning -- Continue fall precautions -  2)COVID-19 virus infection -Previously vaccinated -Respiratory symptoms are not severe -Patient with significant weakness and malaise -Continue Paxlovid and bronchodilators -No Significant hypoxia and chest x-ray is not suggestive of COVID-pneumonia---so hold off on steroids -Follow inflammatory markers COVID-19 Labs  Recent Labs    03/04/22 1344 03/05/22 0342 03/06/22 0428  03/07/22 0757  DDIMER 0.68* 0.68* 0.53* 0.79*  FERRITIN 120 174 328 535*  LDH 165  --   --   --   CRP 5.2* 8.8* 9.2*  --    3)HFpEF--Acute on chronic diastolic congestive heart failure (HCC) -Echo with preserved EF of 50 to 55% with severe asymmetric LVH and mild pulmonary hypertension -No aortic stenosis -Lower extremity edema and dyspnea improving with diuresis-Dopplers without DVT -Admission chest x-ray was consistent with CHF  -Continue Lasix -Daily weights and fluid input and output monitoring -Continue lisinopril and metoprolol -Weight is down to 151 pounds from 156 pounds on 03/05/2022  Intake/Output Summary (Last 24 hours) at 03/07/2022 1142 Last data filed at 03/07/2022 0340 Gross per 24 hour  Intake 359.9 ml  Output 1400 ml  Net -1040.1 ml   4)Hypokalemia/Hypophosphatemia/hypomagnesemia -In the setting of diuretic use -Replace and recheck  5)Hypertension Stable. Continue metoprolol lisinopril okay to stop HCTZ -Lasix as ordered  6)DM (diabetes mellitus) (HCC) .  A1c 6.5 reflecting good diabetic control PTA -Use Novolog/Humalog Sliding scale insulin with Accu-Cheks/Fingersticks as ordered   7)Urinary retention--- c/n  Flomax    8)Possible UTI----UA was somewhat suggestive of UTI -However urine culture with contaminated/mixed growth -Treated with IV Rocephin from 03/05/2022 through 03/07/2022 inclusive for 3 doses -No further antibiotics indicated  9)Thrombocytopenia---??  Related to infection -Platelets have normalized  10) hospital-acquired delirium with increased confusion and falls--- check CT head -Trazodone nightly -Lorazepam as ordered -Haldol was ineffective  Disposition/Need for in-Hospital Stay- patient unable to be discharged at this time due to - -Generalized weakness and falls at home--- awaiting transfer to SNF rehab --SNF facility requires patient to be  at least 10 days out from COVID diagnosis prior to be admitted to SNF (dxed 03/04/22) ---   Patient continues to have recurrent falls even in the  hospital  Status is: Inpatient   Disposition: The patient is from: Home              Anticipated d/c is to: Home              Anticipated d/c date is: 1 day              Patient currently is not medically stable to d/c. Barriers: Not Clinically Stable-   Code Status :  -  Code Status: Partial Code   Family Communication:  Discussed with daughter in law Guy Roberson----   DVT Prophylaxis  :   - SCDs   enoxaparin (LOVENOX) injection 40 mg Start: 03/04/22 2045  Lab Results  Component Value Date   PLT 166 03/07/2022   Inpatient Medications  Scheduled Meds:  aspirin EC  81 mg Oral q morning   enoxaparin (LOVENOX) injection  40 mg Subcutaneous Q24H   furosemide  20 mg Intravenous Q12H   insulin aspart  0-5 Units Subcutaneous QHS   insulin aspart  0-9 Units Subcutaneous TID WC   lisinopril  20 mg Oral BID   LORazepam  2 mg Oral QHS   metoprolol tartrate  50 mg Oral BID   nirmatrelvir/ritonavir EUA (renal dosing)  2 tablet Oral BID   pantoprazole  40 mg Oral BID AC   tamsulosin  0.4 mg Oral QPC supper   traZODone  100 mg Oral QHS   Continuous Infusions:  cefTRIAXone (ROCEPHIN)  IV Stopped (03/06/22 1758)   magnesium sulfate bolus IVPB     PRN Meds:.acetaminophen **OR** acetaminophen, guaiFENesin-dextromethorphan, hydrALAZINE, polyethylene glycol, promethazine   Anti-infectives (From admission, onward)    Start     Dose/Rate Route Frequency Ordered Stop   03/05/22 1800  cefTRIAXone (ROCEPHIN) 1 g in sodium chloride 0.9 % 100 mL IVPB        1 g 200 mL/hr over 30 Minutes Intravenous Every 24 hours 03/05/22 1711     03/05/22 1000  nirmatrelvir/ritonavir EUA (renal dosing) (PAXLOVID) 2 tablet        2 tablet Oral 2 times daily 03/05/22 0735 03/10/22 0959   03/04/22 2200  nirmatrelvir/ritonavir EUA (PAXLOVID) 3 tablet  Status:  Discontinued        3 tablet Oral 2 times daily 03/04/22 1927 03/05/22 0735          Subjective: Guy Roberson today has no fevers, no emesis,  No chest pain,   -Had a fall overnight --Increasing confusion and disorientation with agitation -Daughter-in-law Guy Roberson is at bedside  Objective: Vitals:   03/06/22 2026 03/07/22 0134 03/07/22 0138 03/07/22 0300  BP: (!) 198/73 (!) 155/78 (!) 155/78 (!) 162/64  Pulse: 69 76 76 68  Resp: 18   18  Temp: 97.8 F (36.6 C)   97.8 F (36.6 C)  TempSrc: Oral   Oral  SpO2: 96% 97% 97% 99%  Weight:    68.5 kg  Height:        Intake/Output Summary (Last 24 hours) at 03/07/2022 1139 Last data filed at 03/07/2022 0340 Gross per 24 hour  Intake 359.9 ml  Output 1400 ml  Net -1040.1 ml   Filed Weights   03/04/22 1800 03/05/22 0500 03/07/22 0300  Weight: 70.7 kg 70.9 kg 68.5 kg    Physical Exam Gen:- Awake, confusional episodes with disorientation ,  no conversational dyspnea HEENT:- [place].AT, No sclera icterus Neck-Supple Neck,No JVD,.  Lungs-no wheezing, somewhat diminished on the right, CV- S1, S2 normal, regular , prior CABG scar, pacemaker in situ Abd-  +ve B.Sounds, Abd Soft, No tenderness,    Extremity/Skin:-Improving edema, pedal pulses present  Psych-confused, disoriented, agitated at times  neuro-generalized weakness no new focal deficits, no tremors GU-urinary incontinence  Data Reviewed: I have personally reviewed following labs and imaging studies  CBC: Recent Labs  Lab 03/04/22 1344 03/05/22 0342 03/06/22 0428 03/07/22 0757  WBC 9.0 9.4 6.9 6.7  NEUTROABS 7.2 7.2 4.7 4.7  HGB 14.9 14.0 14.2 14.9  HCT 43.2 41.3 41.2 42.3  MCV 93.3 94.5 92.0 89.8  PLT 143* 157 137* 166   Basic Metabolic Panel: Recent Labs  Lab 03/04/22 1344 03/05/22 0342 03/06/22 0428 03/07/22 0757 03/07/22 0759  NA 136 136 134* 131* 131*  K 3.4* 3.1* 2.9* 2.7* 2.8*  CL 100 101 92* 90* 90*  CO2 28 26 30 27 27   GLUCOSE 243* 112* 109* 105* 105*  BUN 20 22 24* 25* 26*  CREATININE 0.94 0.96 1.04 0.90 0.93  CALCIUM 9.8 9.6 9.4  9.2 9.2  MG  --  1.9 1.8 1.6*  --   PHOS  --  2.3* 3.2 2.5 2.5   GFR: Estimated Creatinine Clearance: 52.3 mL/min (by C-G formula based on SCr of 0.93 mg/dL). Liver Function Tests: Recent Labs  Lab 03/04/22 1344 03/05/22 0342 03/06/22 0428 03/07/22 0757 03/07/22 0759  AST 22 23 28  43*  --   ALT 15 16 18 22   --   ALKPHOS 54 50 50 55  --   BILITOT 0.9 1.0 1.1 1.2  --   PROT 6.4* 6.0* 6.0* 6.3*  --   ALBUMIN 3.6 3.3* 3.2* 3.5 3.6   HbA1C: Recent Labs    03/05/22 0342  HGBA1C 6.5*   Recent Results (from the past 240 hour(s))  Blood Culture (routine x 2)     Status: None (Preliminary result)   Collection Time: 03/04/22 12:44 PM   Specimen: BLOOD  Result Value Ref Range Status   Specimen Description BLOOD RIGHT WRIST  Final   Special Requests   Final    BOTTLES DRAWN AEROBIC AND ANAEROBIC Blood Culture adequate volume   Culture   Final    NO GROWTH 3 DAYS Performed at [facility], [address], [place], [facility] [address]    Report Status PENDING  Incomplete  SARS Coronavirus 2 by RT PCR (hospital order, performed in [facility] hospital lab) *cepheid single result test* Anterior Nasal Swab     Status: Abnormal   Collection Time: 03/04/22 12:58 PM   Specimen: Anterior Nasal Swab  Result Value Ref Range Status   SARS Coronavirus 2 by RT PCR POSITIVE (A) NEGATIVE Final    Comment: (NOTE) SARS-CoV-2 target nucleic acids are DETECTED  SARS-CoV-2 RNA is generally detectable in upper respiratory specimens  during the acute phase of infection.  Positive results are indicative  of the presence of the identified virus, but do not rule out bacterial infection or co-infection with other pathogens not detected by the test.  Clinical correlation with patient history and  other diagnostic information is necessary to determine patient infection status.  The expected result is negative.  Fact Sheet for Patients:   26712   Fact Sheet for  Healthcare Providers:   [facility]    This test is not yet approved or cleared by the 05/04/22 FDA and  has  been authorized for detection and/or diagnosis of SARS-CoV-2 by FDA under an Emergency Use Authorization (EUA).  This EUA will remain in effect (meaning this test can be used) for the duration of  the COVID-19 declaration under Section 564(b)(1)  of the Act, 21 U.S.C. section 360-bbb-3(b)(1), unless the authorization is terminated or revoked sooner.   Performed at [facility], [address] [place], [place] [address]   Blood Culture (routine x 2)     Status: None (Preliminary result)   Collection Time: 03/04/22  1:44 PM   Specimen: BLOOD  Result Value Ref Range Status   Specimen Description BLOOD LEFT WRIST  Final   Special Requests   Final    BOTTLES DRAWN AEROBIC AND ANAEROBIC Blood Culture results may not be optimal due to an excessive volume of blood received in culture bottles   Culture   Final    NO GROWTH 3 DAYS Performed at [facility], [address] [place], [place] [address]    Report Status PENDING  Incomplete  Urine Culture     Status: Abnormal   Collection Time: 03/05/22 11:29 AM   Specimen: Urine, Clean Catch  Result Value Ref Range Status   Specimen Description   Final    URINE, CLEAN CATCH Performed at [facility], [address] [place], [place] [address]    Special Requests   Final    NONE Performed at [facility], [address] [place], [place] [address]    Culture MULTIPLE SPECIES PRESENT, SUGGEST RECOLLECTION (A)  Final   Report Status 03/06/2022 FINAL  Final      Radiology Studies: ECHOCARDIOGRAM COMPLETE  Result Date: 03/05/2022    ECHOCARDIOGRAM REPORT   Patient Name:   Ledell NossBOBBY W Leiva Date of Exam: 03/05/2022 Medical Rec #:  [id number]     Height:       67.0 in Accession #:    [id number]    Weight:       156.3 lb Date of Birth:  Nov 28, 1931    BSA:          1.821 m Patient Age:    89 years      BP:            155/101 mmHg Patient Gender: M             HR:           63 bpm. Exam Location:  Inpatient Procedure: 2D Echo, 3D Echo, Cardiac Doppler and Color Doppler Indications:    CHF  History:        Patient has no prior history of Echocardiogram examinations.                 Pacemaker, Arrythmias:Complete Heart Block; Risk                 Factors:Diabetes, Dyslipidemia and Hypertension. COVID 19.  Sonographer:    Leta Junglingiffany Cooper RDCS Referring Phys: [phone] Heloise BeechamJIROGHENE E [facility]  Sonographer Comments: Suboptimal subcostal window. Echo performed with patient sitting upright in chair for comfort. IMPRESSIONS  1. Hypokinesis of the mid/distal inferoseptal wall. Left ventricular ejection fraction, by estimation, is 50 to 55%. The left ventricle has low normal function. There is severe asymmetric left ventricular hypertrophy. Left ventricular diastolic parameters are indeterminate.  2. Right ventricular systolic function is mildly reduced. The right ventricular size is normal. There is mildly elevated pulmonary artery systolic pressure.  3. Mild mitral valve regurgitation.  4. The aortic valve is tricuspid. Aortic valve regurgitation is  mild. Aortic valve sclerosis is present, with no evidence of aortic valve stenosis. FINDINGS  Left Ventricle: Hypokinesis of the mid/distal inferoseptal wall. Left ventricular ejection fraction, by estimation, is 50 to 55%. The left ventricle has low normal function. The left ventricular internal cavity size was normal in size. There is severe asymmetric left ventricular hypertrophy. Left ventricular diastolic parameters are indeterminate. Right Ventricle: The right ventricular size is normal. Right vetricular wall thickness was not assessed. Right ventricular systolic function is mildly reduced. There is mildly elevated pulmonary artery systolic pressure. The tricuspid regurgitant velocity is 2.99 m/s, and with an assumed right atrial pressure of 8 mmHg, the estimated right ventricular systolic  pressure is 83.3 mmHg. Left Atrium: Left atrial size was normal in size. Right Atrium: Right atrial size was normal in size. Pericardium: There is no evidence of pericardial effusion. Mitral Valve: There is mild thickening of the mitral valve leaflet(s). Mild mitral annular calcification. Mild mitral valve regurgitation. Tricuspid Valve: The tricuspid valve is normal in structure. Tricuspid valve regurgitation is mild. Aortic Valve: The aortic valve is tricuspid. Aortic valve regurgitation is mild. Aortic valve sclerosis is present, with no evidence of aortic valve stenosis. Pulmonic Valve: The pulmonic valve was normal in structure. Pulmonic valve regurgitation is mild. Aorta: The aortic root and ascending aorta are structurally normal, with no evidence of dilitation. Venous: The inferior vena cava was not well visualized. IAS/Shunts: No atrial level shunt detected by color flow Doppler. Additional Comments: A device lead is visualized.  LEFT VENTRICLE PLAX 2D LVIDd:         4.70 cm   Diastology LVIDs:         3.60 cm   LV e' medial:    3.64 cm/s LV PW:         0.80 cm   LV E/e' medial:  28.3 LV IVS:        1.70 cm   LV e' lateral:   7.83 cm/s LVOT diam:     1.80 cm   LV E/e' lateral: 13.2 LV SV:         42 LV SV Index:   23 LVOT Area:     2.54 cm                           3D Volume EF:                          3D EF:        52 %                          LV EDV:       132 ml                          LV ESV:       64 ml                          LV SV:        68 ml RIGHT VENTRICLE RV S prime:     9.34 cm/s TAPSE (M-mode): 1.3 cm LEFT ATRIUM             Index        RIGHT ATRIUM           Index LA diam:  4.80 cm 2.64 cm/m   RA Area:     13.60 cm LA Vol (A2C):   56.9 ml 31.24 ml/m  RA Volume:   30.30 ml  16.64 ml/m LA Vol (A4C):   60.4 ml 33.17 ml/m LA Biplane Vol: 60.3 ml 33.11 ml/m  AORTIC VALVE             PULMONIC VALVE LVOT Vmax:   78.30 cm/s  PR End Diast Vel: 6.66 msec LVOT Vmean:  48.200 cm/s LVOT  VTI:    0.167 m  AORTA Ao Root diam: 3.20 cm Ao Asc diam:  3.10 cm MITRAL VALVE                TRICUSPID VALVE MV Area (PHT): 4.83 cm     TR Peak grad:   35.8 mmHg MV Decel Time: 157 msec     TR Vmax:        299.00 cm/s MV E velocity: 103.00 cm/s MV A velocity: 99.80 cm/s   SHUNTS MV E/A ratio:  1.03         Systemic VTI:  0.17 m                             Systemic Diam: 1.80 cm Dietrich Pates MD Electronically signed by Dietrich Pates MD Signature Date/Time: 03/05/2022/5:07:38 PM    Final      Scheduled Meds:  aspirin EC  81 mg Oral q morning   enoxaparin (LOVENOX) injection  40 mg Subcutaneous Q24H   furosemide  20 mg Intravenous Q12H   insulin aspart  0-5 Units Subcutaneous QHS   insulin aspart  0-9 Units Subcutaneous TID WC   lisinopril  20 mg Oral BID   LORazepam  2 mg Oral QHS   metoprolol tartrate  50 mg Oral BID   nirmatrelvir/ritonavir EUA (renal dosing)  2 tablet Oral BID   pantoprazole  40 mg Oral BID AC   tamsulosin  0.4 mg Oral QPC supper   traZODone  100 mg Oral QHS   Continuous Infusions:  cefTRIAXone (ROCEPHIN)  IV Stopped (03/06/22 1758)   magnesium sulfate bolus IVPB       LOS: 2 days   Shon Hale M.D on 03/07/2022 at 11:39 AM  Go to [email or web address] - for contact info  [facility] - Office  [phone]  If 7PM-7AM, please contact night-coverage [email or web address] 03/07/2022, 11:39 AM

## 2022-03-08 DIAGNOSIS — R531 Weakness: Secondary | ICD-10-CM | POA: Diagnosis not present

## 2022-03-08 LAB — COMPREHENSIVE METABOLIC PANEL
ALT: 32 U/L (ref 0–44)
AST: 79 U/L — ABNORMAL HIGH (ref 15–41)
Albumin: 3.6 g/dL (ref 3.5–5.0)
Alkaline Phosphatase: 55 U/L (ref 38–126)
Anion gap: 15 (ref 5–15)
BUN: 26 mg/dL — ABNORMAL HIGH (ref 8–23)
CO2: 25 mmol/L (ref 22–32)
Calcium: 9.5 mg/dL (ref 8.9–10.3)
Chloride: 95 mmol/L — ABNORMAL LOW (ref 98–111)
Creatinine, Ser: 1.01 mg/dL (ref 0.61–1.24)
GFR, Estimated: >60 mL/min (ref >60)
Glucose, Bld: 123 mg/dL — ABNORMAL HIGH (ref 70–99)
Potassium: 3 mmol/L — ABNORMAL LOW (ref 3.5–5.1)
Sodium: 135 mmol/L (ref 135–145)
Total Bilirubin: 1.6 mg/dL — ABNORMAL HIGH (ref 0.3–1.2)
Total Protein: 6.3 g/dL — ABNORMAL LOW (ref 6.5–8.1)

## 2022-03-08 LAB — FERRITIN: Ferritin: 623 ng/mL — ABNORMAL HIGH (ref 24–336)

## 2022-03-08 LAB — RENAL FUNCTION PANEL
Albumin: 3.6 g/dL (ref 3.5–5.0)
Anion gap: 14 (ref 5–15)
BUN: 27 mg/dL — ABNORMAL HIGH (ref 8–23)
CO2: 27 mmol/L (ref 22–32)
Calcium: 9.5 mg/dL (ref 8.9–10.3)
Chloride: 95 mmol/L — ABNORMAL LOW (ref 98–111)
Creatinine, Ser: 1.02 mg/dL (ref 0.61–1.24)
GFR, Estimated: >60 mL/min (ref >60)
Glucose, Bld: 124 mg/dL — ABNORMAL HIGH (ref 70–99)
Phosphorus: 3.4 mg/dL (ref 2.5–4.6)
Potassium: 3 mmol/L — ABNORMAL LOW (ref 3.5–5.1)
Sodium: 136 mmol/L (ref 135–145)

## 2022-03-08 LAB — CBC WITH DIFFERENTIAL/PLATELET
Abs Immature Granulocytes: 0.03 10*3/uL (ref 0.00–0.07)
Basophils Absolute: 0 10*3/uL (ref 0.0–0.1)
Basophils Relative: 0 %
Eosinophils Absolute: 0 10*3/uL (ref 0.0–0.5)
Eosinophils Relative: 0 %
HCT: 42.9 % (ref 39.0–52.0)
Hemoglobin: 15.1 g/dL (ref 13.0–17.0)
Immature Granulocytes: 1 %
Lymphocytes Relative: 21 %
Lymphs Abs: 1.1 10*3/uL (ref 0.7–4.0)
MCH: 31.5 pg (ref 26.0–34.0)
MCHC: 35.2 g/dL (ref 30.0–36.0)
MCV: 89.4 fL (ref 80.0–100.0)
Monocytes Absolute: 0.8 10*3/uL (ref 0.1–1.0)
Monocytes Relative: 15 %
Neutro Abs: 3.5 10*3/uL (ref 1.7–7.7)
Neutrophils Relative %: 63 %
Platelets: 181 10*3/uL (ref 150–400)
RBC: 4.8 MIL/uL (ref 4.22–5.81)
RDW: 12.4 % (ref 11.5–15.5)
WBC: 5.4 10*3/uL (ref 4.0–10.5)
nRBC: 0 % (ref 0.0–0.2)

## 2022-03-08 LAB — D-DIMER, QUANTITATIVE: D-Dimer, Quant: 0.47 ug/mL-FEU (ref 0.00–0.50)

## 2022-03-08 LAB — GLUCOSE, CAPILLARY
Glucose-Capillary: 134 mg/dL — ABNORMAL HIGH (ref 70–99)
Glucose-Capillary: 242 mg/dL — ABNORMAL HIGH (ref 70–99)
Glucose-Capillary: 93 mg/dL (ref 70–99)
Glucose-Capillary: 198 mg/dL — ABNORMAL HIGH (ref 70–99)

## 2022-03-08 LAB — C-REACTIVE PROTEIN: CRP: 4.2 mg/dL — ABNORMAL HIGH (ref <1.0)

## 2022-03-08 LAB — MAGNESIUM: Magnesium: 2.2 mg/dL (ref 1.7–2.4)

## 2022-03-08 LAB — PHOSPHORUS: Phosphorus: 3.3 mg/dL (ref 2.5–4.6)

## 2022-03-08 MED ORDER — MAGIC MOUTHWASH W/LIDOCAINE
5.0000 mL | Freq: Four times a day (QID) | ORAL | Status: DC
  Administered 2022-03-08 – 2022-03-16 (×26): 5 mL via ORAL
  Filled 2022-03-08 (×30): qty 5

## 2022-03-08 MED ORDER — POTASSIUM CHLORIDE CRYS ER 20 MEQ PO TBCR
40.0000 meq | EXTENDED_RELEASE_TABLET | ORAL | Status: AC
  Administered 2022-03-08 (×2): 40 meq via ORAL
  Filled 2022-03-08 (×2): qty 2

## 2022-03-08 MED ORDER — SODIUM CHLORIDE 0.9 % IV SOLN: INTRAVENOUS | Status: AC

## 2022-03-08 NOTE — Progress Notes (Signed)
PROGRESS NOTE     Guy Roberson, is a 88 y.o. male, DOB - 08-17-1931, HBZ:[id number]  Admit date - 03/04/2022   Admitting Physician Jylian Pappalardo Mariea Clonts, MD  Outpatient Primary MD for the patient is [name], MD  LOS - 3  Chief Complaint  Patient presents with   Weakness        Brief Narrative:  88 y.o. male with medical history significant for DM, HTN, complete heart block with pacemaker,  BPH admitted on 03/04/2022 with urinary incontinence and dyspnea in the setting of COVID-19 infection   Plan: 1) Generalized weakness/status post fall---Multifactorial etiology suspected in a patient with COVID-19 infection and CHF  -CT head without acute findings --Pt Was hoping to go home however unable to walk safely with physical therapy and did very poorly with ambulation with family at bedside.... Very high fall risk -Patient reluctantly agrees to go to SNF rehab -Physical therapy eval appreciated recommends SNF rehab -SNF facility requires patient to be at least 10 days out from COVID diagnosis prior to be admitted to SNF (dxed 03/04/22) -Recurrent falls---PTA pt lived alone and did very poorly, patient has significant limitations with mobility related ADLs- this patient needs to continue to be monitored in the hospital until a SNF bed is obtained as she is not safe to go home with her current physcical limitations -- Patient continues to have recurrent falls even in the  hospital  03/08/22 -Hospital delirium with confusion, disorientation and agitation -Continue soft wrist restraints -Oral intake is not great----start gentle IV fluid hydration (H/o CHF) -- Continue fall precautions -  2)COVID-19 virus infection -Previously vaccinated -Respiratory symptoms are not severe -Patient with significant weakness and malaise -Continue Paxlovid and bronchodilators -No Significant hypoxia and chest x-ray is not suggestive of COVID-pneumonia---so hold off on steroids -Follow inflammatory  markers--D-dimer and CRP trending down COVID-19 Labs  Recent Labs    03/06/22 0428 03/07/22 0757 03/08/22 0716  DDIMER 0.53* 0.79* 0.47  FERRITIN 328 535* 623*  CRP 9.2* 4.7* 4.2*   3)HFpEF--Acute on chronic diastolic congestive heart failure (HCC) -Echo with preserved EF of 50 to 55% with severe asymmetric LVH and mild pulmonary hypertension -No aortic stenosis -Lower extremity edema and dyspnea improving with diuresis-Dopplers without DVT -Admission chest x-ray was consistent with CHF  -Daily weights and fluid input and output monitoring -Continue lisinopril and metoprolol --Diuresed with Lasix initially -Oral intake is poor may need gentle IV fluid hydration -Avoid additional aggressive diuresis  4)Hypokalemia/Hypophosphatemia/hypomagnesemia -In the setting of diuretic use -Replace and recheck  5)Hypertension Stable. Continue metoprolol , lisinopril  -HCTZ discontinued   6)DM (diabetes mellitus) (HCC) .  A1c 6.5 reflecting good diabetic control PTA -Use Novolog/Humalog Sliding scale insulin with Accu-Cheks/Fingersticks as ordered   7)Urinary retention--- c/n  Flomax    8)Possible UTI----UA was somewhat suggestive of UTI -However urine culture with contaminated/mixed growth -Treated with IV Rocephin from 03/05/2022 through 03/07/2022 inclusive for 3 doses -No further antibiotics indicated  9)Thrombocytopenia---??  Related to infection -Platelets have normalized  10)Hospital-acquired delirium with increased confusion and falls--- - CT head post fall without acute abnormalities -Hospital delirium with confusion, disorientation and agitation -Continue soft wrist restraints -Oral intake is not great----start gentle IV fluid hydration -- Continue fall precautions  Trazodone nightly -Lorazepam prn as ordered -Haldol was ineffective  Disposition/Need for in-Hospital Stay- patient unable to be discharged at this time due to - -Generalized weakness and falls at home---  awaiting transfer to SNF rehab --SNF facility requires patient to be at  least 10 days out from COVID diagnosis prior to be admitted to SNF (dxed 03/04/22) ---  Patient continues to have recurrent falls even in the  hospital -- Currently not medically ready for discharge due to significant confusion/hospital-acquired delirium and psychosis  Status is: Inpatient   Disposition: The patient is from: Home              Anticipated d/c is to: Home              Anticipated d/c date is: 1 day              Patient currently is not medically stable to d/c. Barriers: Not Clinically Stable-   Code Status :  -  Code Status: Partial Code   Family Communication: Previously discussed with daughter in law Guy Roberson----   DVT Prophylaxis  :   - SCDs   enoxaparin (LOVENOX) injection 40 mg Start: 03/04/22 2045  Lab Results  Component Value Date   PLT 181 03/08/2022   Inpatient Medications  Scheduled Meds:  aspirin EC  81 mg Oral q morning   enoxaparin (LOVENOX) injection  40 mg Subcutaneous Q24H   feeding supplement  237 mL Oral BID BM   furosemide  20 mg Intravenous Daily   insulin aspart  0-5 Units Subcutaneous QHS   insulin aspart  0-9 Units Subcutaneous TID WC   lisinopril  20 mg Oral BID   magic mouthwash w/lidocaine  5 mL Oral QID   metoprolol tartrate  50 mg Oral BID   nirmatrelvir/ritonavir EUA (renal dosing)  2 tablet Oral BID   pantoprazole  40 mg Oral BID AC   potassium chloride  40 mEq Oral Q3H   tamsulosin  0.4 mg Oral QPC supper   traZODone  100 mg Oral QHS   Continuous Infusions:  sodium chloride 50 mL/hr at 03/08/22 1736   PRN Meds:.acetaminophen **OR** acetaminophen, guaiFENesin-dextromethorphan, hydrALAZINE, LORazepam, polyethylene glycol, promethazine   Anti-infectives (From admission, onward)    Start     Dose/Rate Route Frequency Ordered Stop   03/05/22 1800  cefTRIAXone (ROCEPHIN) 1 g in sodium chloride 0.9 % 100 mL IVPB        1 g 200 mL/hr over 30 Minutes  Intravenous Every 24 hours 03/05/22 1711 03/08/22 0854   03/05/22 1000  nirmatrelvir/ritonavir EUA (renal dosing) (PAXLOVID) 2 tablet        2 tablet Oral 2 times daily 03/05/22 0735 03/10/22 0959   03/04/22 2200  nirmatrelvir/ritonavir EUA (PAXLOVID) 3 tablet  Status:  Discontinued        3 tablet Oral 2 times daily 03/04/22 1927 03/05/22 0735         Subjective: Guy Roberson today has no fevers, no emesis,  No chest pain,   Hospital delirium with confusion, disorientation and agitation -Continue soft wrist restraints -Oral intake is not great----start gentle IV fluid hydration -- Continue fall precautions -  Objective: Vitals:   03/07/22 2106 03/08/22 0339 03/08/22 0834 03/08/22 1505  BP: (!) 155/59 (!) 161/89 (!) 178/67 (!) 146/69  Pulse: 100 72 67 (!) 59  Resp: 18 19  18   Temp: 98.2 F (36.8 C) 97.7 F (36.5 C)  98.5 F (36.9 C)  TempSrc: Oral Oral  Oral  SpO2: 94% (!) 88% 97% 97%  Weight:  69 kg    Height:        Intake/Output Summary (Last 24 hours) at 03/08/2022 1814 Last data filed at 03/08/2022 1736 Gross per 24 hour  Intake 18.8  ml  Output 900 ml  Net -881.2 ml   Filed Weights   03/05/22 0500 03/07/22 0300 03/08/22 0339  Weight: 70.9 kg 68.5 kg 69 kg    Physical Exam Gen:- Awake, confusional episodes with disorientation , no conversational dyspnea HEENT:- [place].AT, No sclera icterus Neck-Supple Neck,No JVD,.  Lungs-no wheezing, somewhat diminished on the right, CV- S1, S2 normal, regular , prior CABG scar, pacemaker in situ Abd-  +ve B.Sounds, Abd Soft, No tenderness,    Extremity/Skin:-Improving edema, pedal pulses present  Psych-confused, disoriented, agitated at times  neuro-generalized weakness no new focal deficits, no tremors GU-urinary incontinence MSK-soft wrist restraints  Data Reviewed: I have personally reviewed following labs and imaging studies  CBC: Recent Labs  Lab 03/04/22 1344 03/05/22 0342 03/06/22 0428 03/07/22 0757  03/08/22 0716  WBC 9.0 9.4 6.9 6.7 5.4  NEUTROABS 7.2 7.2 4.7 4.7 3.5  HGB 14.9 14.0 14.2 14.9 15.1  HCT 43.2 41.3 41.2 42.3 42.9  MCV 93.3 94.5 92.0 89.8 89.4  PLT 143* 157 137* 166 034   Basic Metabolic Panel: Recent Labs  Lab 03/05/22 0342 03/06/22 0428 03/07/22 0757 03/07/22 0759 03/08/22 0716 03/08/22 0717  NA 136 134* 131* 131* 135 136  K 3.1* 2.9* 2.7* 2.8* 3.0* 3.0*  CL 101 92* 90* 90* 95* 95*  CO2 26 30 27 27 25 27   GLUCOSE 112* 109* 105* 105* 123* 124*  BUN 22 24* 25* 26* 26* 27*  CREATININE 0.96 1.04 0.90 0.93 1.01 1.02  CALCIUM 9.6 9.4 9.2 9.2 9.5 9.5  MG 1.9 1.8 1.6*  --  2.2  --   PHOS 2.3* 3.2 2.5 2.5 3.3 3.4   GFR: Estimated Creatinine Clearance: 47.7 mL/min (by C-G formula based on SCr of 1.02 mg/dL). Liver Function Tests: Recent Labs  Lab 03/04/22 1344 03/05/22 0342 03/06/22 0428 03/07/22 0757 03/07/22 0759 03/08/22 0716 03/08/22 0717  AST 22 23 28  43*  --  79*  --   ALT 15 16 18 22   --  32  --   ALKPHOS 54 50 50 55  --  55  --   BILITOT 0.9 1.0 1.1 1.2  --  1.6*  --   PROT 6.4* 6.0* 6.0* 6.3*  --  6.3*  --   ALBUMIN 3.6 3.3* 3.2* 3.5 3.6 3.6 3.6   HbA1C: No results for input(s): "HGBA1C" in the last 72 hours.  Recent Results (from the past 240 hour(s))  Blood Culture (routine x 2)     Status: None (Preliminary result)   Collection Time: 03/04/22 12:44 PM   Specimen: BLOOD  Result Value Ref Range Status   Specimen Description BLOOD RIGHT WRIST  Final   Special Requests   Final    BOTTLES DRAWN AEROBIC AND ANAEROBIC Blood Culture adequate volume   Culture   Final    NO GROWTH 4 DAYS Performed at [facility], [address] [place], [place] [address]    Report Status PENDING  Incomplete  SARS Coronavirus 2 by RT PCR (hospital order, performed in [place] hospital lab) *cepheid single result test* Anterior Nasal Swab     Status: Abnormal   Collection Time: 03/04/22 12:58 PM   Specimen: Anterior Nasal Swab  Result Value Ref Range  Status   SARS Coronavirus 2 by RT PCR POSITIVE (A) NEGATIVE Final    Comment: (NOTE) SARS-CoV-2 target nucleic acids are DETECTED  SARS-CoV-2 RNA is generally detectable in upper respiratory specimens  during the acute phase of infection.  Positive results are  indicative  of the presence of the identified virus, but do not rule out bacterial infection or co-infection with other pathogens not detected by the test.  Clinical correlation with patient history and  other diagnostic information is necessary to determine patient infection status.  The expected result is negative.  Fact Sheet for Patients:   RoadLapTop.co.za   Fact Sheet for Healthcare Providers:   http://kim-miller.com/    This test is not yet approved or cleared by the [place] FDA and  has been authorized for detection and/or diagnosis of SARS-CoV-2 by FDA under an Emergency Use Authorization (EUA).  This EUA will remain in effect (meaning this test can be used) for the duration of  the COVID-19 declaration under Section 564(b)(1)  of the Act, 21 U.S.C. section 360-bbb-3(b)(1), unless the authorization is terminated or revoked sooner.   Performed at [facility], [address], [place], [place] [address]   Blood Culture (routine x 2)     Status: None (Preliminary result)   Collection Time: 03/04/22  1:44 PM   Specimen: BLOOD  Result Value Ref Range Status   Specimen Description BLOOD LEFT WRIST  Final   Special Requests   Final    BOTTLES DRAWN AEROBIC AND ANAEROBIC Blood Culture results may not be optimal due to an excessive volume of blood received in culture bottles   Culture   Final    NO GROWTH 4 DAYS Performed at [facility], [address] [place], [place] [address]    Report Status PENDING  Incomplete  Urine Culture     Status: Abnormal   Collection Time: 03/05/22 11:29 AM   Specimen: Urine, Clean Catch  Result Value Ref Range Status   Specimen  Description   Final    URINE, CLEAN CATCH Performed at [facility], [address] [place], [place] [address]    Special Requests   Final    NONE Performed at [facility], [address], [place], [place] [address]    Culture MULTIPLE SPECIES PRESENT, SUGGEST RECOLLECTION (A)  Final   Report Status 03/06/2022 FINAL  Final      Radiology Studies: CT HEAD WO CONTRAST ( )  Result Date: 03/07/2022 CLINICAL DATA:  Mental status change. Increased confusion and falls. EXAM: CT HEAD WITHOUT CONTRAST TECHNIQUE: Contiguous axial images were obtained from the base of the skull through the vertex without intravenous contrast. RADIATION DOSE REDUCTION: This exam was performed according to the departmental dose-optimization program which includes automated exposure control, adjustment of the mA and/or kV according to patient size and/or use of iterative reconstruction technique. COMPARISON:  CT head without contrast 03/04/2022. FINDINGS: Brain: Moderate generalized atrophy and white matter disease is stable. No acute infarct, hemorrhage, or mass lesion is present. The ventricles are proportionate to the degree of atrophy. No significant extraaxial fluid collection is present. Basal ganglia are intact. No acute or focal cortical abnormality is present. The brainstem and cerebellum are within normal limits. Vascular: Atherosclerotic calcifications are present within the cavernous internal carotid arteries bilaterally and at the dural margin of both vertebral arteries. No hyperdense vessel is present. Skull: Calvarium is intact. No focal lytic or blastic lesions are present. No significant extracranial soft tissue lesion is present. Sinuses/Orbits: Mild mucosal thickening is present throughout the ethmoid air cells. No fluid levels are present. The paranasal sinuses and mastoid air cells are otherwise clear. Bilateral lens replacements are noted. Globes and orbits are otherwise unremarkable. IMPRESSION: 1. No  acute intracranial abnormality or significant interval change. 2.  Stable atrophy and white matter disease. This likely reflects the sequela of chronic microvascular ischemia. Electronically Signed   By: Marin Roberts M.D.   On: 03/07/2022 14:58     Scheduled Meds:  aspirin EC  81 mg Oral q morning   enoxaparin (LOVENOX) injection  40 mg Subcutaneous Q24H   feeding supplement  237 mL Oral BID BM   furosemide  20 mg Intravenous Daily   insulin aspart  0-5 Units Subcutaneous QHS   insulin aspart  0-9 Units Subcutaneous TID WC   lisinopril  20 mg Oral BID   magic mouthwash w/lidocaine  5 mL Oral QID   metoprolol tartrate  50 mg Oral BID   nirmatrelvir/ritonavir EUA (renal dosing)  2 tablet Oral BID   pantoprazole  40 mg Oral BID AC   potassium chloride  40 mEq Oral Q3H   tamsulosin  0.4 mg Oral QPC supper   traZODone  100 mg Oral QHS   Continuous Infusions:  sodium chloride 50 mL/hr at 03/08/22 1736     LOS: 3 days   Shon Hale M.D on 03/08/2022 at 6:14 PM  Go to [email or web address] - for contact info  [facility] - Office  [phone]  If 7PM-7AM, please contact night-coverage [email or web address] 03/08/2022, 6:14 PM

## 2022-03-09 DIAGNOSIS — R531 Weakness: Secondary | ICD-10-CM | POA: Diagnosis not present

## 2022-03-09 LAB — CULTURE, BLOOD (ROUTINE X 2)
Culture: NO GROWTH
Culture: NO GROWTH
Special Requests: ADEQUATE

## 2022-03-09 LAB — GLUCOSE, CAPILLARY
Glucose-Capillary: 134 mg/dL — ABNORMAL HIGH (ref 70–99)
Glucose-Capillary: 207 mg/dL — ABNORMAL HIGH (ref 70–99)
Glucose-Capillary: 194 mg/dL — ABNORMAL HIGH (ref 70–99)
Glucose-Capillary: 174 mg/dL — ABNORMAL HIGH (ref 70–99)

## 2022-03-09 LAB — COMPREHENSIVE METABOLIC PANEL
ALT: 39 U/L (ref 0–44)
AST: 71 U/L — ABNORMAL HIGH (ref 15–41)
Albumin: 3.3 g/dL — ABNORMAL LOW (ref 3.5–5.0)
Alkaline Phosphatase: 49 U/L (ref 38–126)
Anion gap: 10 (ref 5–15)
BUN: 30 mg/dL — ABNORMAL HIGH (ref 8–23)
CO2: 26 mmol/L (ref 22–32)
Calcium: 9.2 mg/dL (ref 8.9–10.3)
Chloride: 100 mmol/L (ref 98–111)
Creatinine, Ser: 0.95 mg/dL (ref 0.61–1.24)
GFR, Estimated: >60 mL/min (ref >60)
Glucose, Bld: 137 mg/dL — ABNORMAL HIGH (ref 70–99)
Potassium: 3.5 mmol/L (ref 3.5–5.1)
Sodium: 136 mmol/L (ref 135–145)
Total Bilirubin: 1.3 mg/dL — ABNORMAL HIGH (ref 0.3–1.2)
Total Protein: 5.9 g/dL — ABNORMAL LOW (ref 6.5–8.1)

## 2022-03-09 MED ORDER — POTASSIUM CHLORIDE CRYS ER 20 MEQ PO TBCR
40.0000 meq | EXTENDED_RELEASE_TABLET | Freq: Once | ORAL | Status: AC
  Administered 2022-03-09: 40 meq via ORAL
  Filled 2022-03-09: qty 2

## 2022-03-09 NOTE — Progress Notes (Signed)
PROGRESS NOTE     Guy Roberson, is a 88 y.o. male, DOB - September 28, 1931, [id number]  Admit date - 03/04/2022   Admitting Physician Michalla Ringer Denton Brick, MD  Outpatient Primary MD for the patient is Guy Evens, MD  LOS - 4  Chief Complaint  Patient presents with   Weakness        Brief Narrative:  88 y.o. male with medical history significant for DM, HTN, complete heart block with pacemaker,  BPH admitted on 03/04/2022 with urinary incontinence and dyspnea in the setting of COVID-19 infection   Plan: 1) Generalized weakness/status post fall---Multifactorial etiology suspected in a patient with COVID-19 infection and CHF  -CT head without acute findings --Pt Was hoping to go home however unable to walk safely with physical therapy and did very poorly with ambulation with family at bedside.... Very high fall risk -Patient reluctantly agrees to go to SNF rehab -Physical therapy eval appreciated recommends SNF rehab -SNF facility requires patient to be at least 10 days out from [place] diagnosis prior to be admitted to SNF (dxed 03/04/22) -Recurrent falls---PTA pt lived alone and did very poorly, patient has significant limitations with mobility related ADLs- this patient needs to continue to be monitored in the hospital until a SNF bed is obtained as she is not safe to go home with her current physcical limitations -- Patient continues to have recurrent falls even in the  hospital  03/09/22 -Hospital delirium with confusion, disorientation and agitation is Not worse -Continue soft wrist restraints -- Continue fall precautions -  2)COVID-19 virus infection -Previously vaccinated -Respiratory symptoms are not severe -Patient with significant weakness and malaise -Continue Paxlovid and bronchodilators -No Significant hypoxia and chest x-ray is not suggestive of COVID-pneumonia---so hold off on steroids  3)HFpEF--Acute on chronic diastolic congestive heart failure (HCC) -Echo with  preserved EF of 50 to 55% with severe asymmetric LVH and mild pulmonary hypertension -No aortic stenosis -Lower extremity edema and dyspnea improving with diuresis-Dopplers without DVT -Admission chest x-ray was consistent with CHF  -Daily weights and fluid input and output monitoring -Continue lisinopril and metoprolol --Diuresed with Lasix initially -Oral intake is poor may need gentle IV fluid hydration -Avoid additional aggressive diuresis  4)Hypokalemia/Hypophosphatemia/hypomagnesemia -In the setting of diuretic use -Replace and recheck  5)Hypertension Stable. Continue metoprolol , lisinopril  -HCTZ discontinued   6)DM (diabetes mellitus) ([place]) .  A1c 6.5 reflecting good diabetic control PTA -Use Novolog/Humalog Sliding scale insulin with Accu-Cheks/Fingersticks as ordered   7)Urinary retention--- c/n  Flomax    8)Possible UTI----UA was somewhat suggestive of UTI -However urine culture with contaminated/mixed growth -Treated with IV Rocephin from 03/05/2022 through 03/07/2022 inclusive for 3 doses -No further antibiotics indicated  9)Thrombocytopenia---??  Related to infection -Platelets have normalized  10)Hospital-acquired delirium with increased confusion and falls--- - CT head post fall without acute abnormalities -Hospital delirium with confusion, disorientation and agitation is Not worse -Continue soft wrist restraints -- Continue fall precautions  Trazodone nightly -Lorazepam prn as ordered -Haldol was ineffective  Disposition/Need for in-Hospital Stay- patient unable to be discharged at this time due to - -Generalized weakness and falls at home--- awaiting transfer to SNF rehab --SNF facility requires patient to be at least 10 days out from COVID diagnosis prior to be admitted to SNF (dxed 03/04/22) ---  Patient continues to have recurrent falls even in the  hospital -- Currently not medically ready for discharge due to significant confusion/hospital-acquired  delirium and psychosis  Status is: Inpatient   Disposition: The patient  is from: Home              Anticipated d/c is to: Home              Anticipated d/c date is: 1 day              Patient currently is not medically stable to d/c. Barriers: Not Clinically Stable-   Code Status :  -  Code Status: Partial Code   Family Communication: Previously discussed with daughter in law Guy Roberson----   DVT Prophylaxis  :   - SCDs   enoxaparin (LOVENOX) injection 40 mg Start: 03/04/22 2045  Lab Results  Component Value Date   PLT 181 03/08/2022   Inpatient Medications  Scheduled Meds:  aspirin EC  81 mg Oral q morning   enoxaparin (LOVENOX) injection  40 mg Subcutaneous Q24H   feeding supplement  237 mL Oral BID BM   furosemide  20 mg Intravenous Daily   insulin aspart  0-5 Units Subcutaneous QHS   insulin aspart  0-9 Units Subcutaneous TID WC   lisinopril  20 mg Oral BID   magic mouthwash w/lidocaine  5 mL Oral QID   metoprolol tartrate  50 mg Oral BID   nirmatrelvir/ritonavir EUA (renal dosing)  2 tablet Oral BID   pantoprazole  40 mg Oral BID AC   potassium chloride  40 mEq Oral Once   tamsulosin  0.4 mg Oral QPC supper   traZODone  100 mg Oral QHS   Continuous Infusions:   PRN Meds:.acetaminophen **OR** acetaminophen, guaiFENesin-dextromethorphan, hydrALAZINE, LORazepam, polyethylene glycol, promethazine   Anti-infectives (From admission, onward)    Start     Dose/Rate Route Frequency Ordered Stop   03/05/22 1800  cefTRIAXone (ROCEPHIN) 1 g in sodium chloride 0.9 % 100 mL IVPB        1 g 200 mL/hr over 30 Minutes Intravenous Every 24 hours 03/05/22 1711 03/08/22 0854   03/05/22 1000  nirmatrelvir/ritonavir EUA (renal dosing) (PAXLOVID) 2 tablet        2 tablet Oral 2 times daily 03/05/22 0735 03/10/22 0959   03/04/22 2200  nirmatrelvir/ritonavir EUA (PAXLOVID) 3 tablet  Status:  Discontinued        3 tablet Oral 2 times daily 03/04/22 1927 03/05/22 0735        Subjective: Evian Ficco today has no fevers, no emesis,  No chest pain,    03/09/22 -Oral intake is inconsistent -Somewhat less confused today  Objective: Vitals:   03/09/22 0309 03/09/22 0500 03/09/22 0806 03/09/22 1430  BP: (!) 146/60  (!) 166/53 (!) 154/79  Pulse: 64  67 (!) 102  Resp: 18   20  Temp: 99 F (37.2 C)   98.9 F (37.2 C)  TempSrc: Axillary   Oral  SpO2: 93%   93%  Weight:  67.8 kg    Height:        Intake/Output Summary (Last 24 hours) at 03/09/2022 1901 Last data filed at 03/09/2022 [id number] Gross per 24 hour  Intake 1198.29 ml  Output 900 ml  Net 298.29 ml   Filed Weights   03/07/22 0300 03/08/22 0339 03/09/22 0500  Weight: 68.5 kg 69 kg 67.8 kg    Physical Exam Gen:- Awake, confusional episodes with disorientation , no conversational dyspnea HEENT:- .AT, No sclera icterus Neck-Supple Neck,No JVD,.  Lungs-no wheezing, somewhat diminished on the right, CV- S1, S2 normal, regular , prior CABG scar, pacemaker in situ Abd-  +ve B.Sounds, Abd Soft, No tenderness,  Extremity/Skin:-Improving edema, pedal pulses present  Psych--- confusion and disorientation is Not worse neuro-generalized weakness no new focal deficits, no tremors GU-urinary incontinence MSK-soft wrist restraints  Data Reviewed: I have personally reviewed following labs and imaging studies  CBC: Recent Labs  Lab 03/04/22 1344 03/05/22 0342 03/06/22 0428 03/07/22 0757 03/08/22 0716  WBC 9.0 9.4 6.9 6.7 5.4  NEUTROABS 7.2 7.2 4.7 4.7 3.5  HGB 14.9 14.0 14.2 14.9 15.1  HCT 43.2 41.3 41.2 42.3 42.9  MCV 93.3 94.5 92.0 89.8 89.4  PLT 143* 157 137* 166 0000000   Basic Metabolic Panel: Recent Labs  Lab 03/05/22 0342 03/06/22 0428 03/07/22 0757 03/07/22 0759 03/08/22 0716 03/08/22 0717 03/09/22 0501  NA 136 134* 131* 131* 135 136 136  K 3.1* 2.9* 2.7* 2.8* 3.0* 3.0* 3.5  CL 101 92* 90* 90* 95* 95* 100  CO2 26 30 27 27 25 27 26   GLUCOSE 112* 109* 105* 105* 123* 124* 137*  BUN  22 24* 25* 26* 26* 27* 30*  CREATININE 0.96 1.04 0.90 0.93 1.01 1.02 0.95  CALCIUM 9.6 9.4 9.2 9.2 9.5 9.5 9.2  MG 1.9 1.8 1.6*  --  2.2  --   --   PHOS 2.3* 3.2 2.5 2.5 3.3 3.4  --    GFR: Estimated Creatinine Clearance: 51.2 mL/min (by C-G formula based on SCr of 0.95 mg/dL). Liver Function Tests: Recent Labs  Lab 03/05/22 0342 03/06/22 0428 03/07/22 0757 03/07/22 0759 03/08/22 0716 03/08/22 0717 03/09/22 0501  AST 23 28 43*  --  79*  --  71*  ALT 16 18 22   --  32  --  39  ALKPHOS 50 50 55  --  55  --  49  BILITOT 1.0 1.1 1.2  --  1.6*  --  1.3*  PROT 6.0* 6.0* 6.3*  --  6.3*  --  5.9*  ALBUMIN 3.3* 3.2* 3.5 3.6 3.6 3.6 3.3*   HbA1C: No results for input(s): "HGBA1C" in the last 72 hours.  Recent Results (from the past 240 hour(s))  Blood Culture (routine x 2)     Status: None   Collection Time: 03/04/22 12:44 PM   Specimen: BLOOD  Result Value Ref Range Status   Specimen Description BLOOD RIGHT WRIST  Final   Special Requests   Final    BOTTLES DRAWN AEROBIC AND ANAEROBIC Blood Culture adequate volume   Culture   Final    NO GROWTH 5 DAYS Performed at [facility], [address] [place], [place] [address]    Report Status 03/09/2022 FINAL  Final  SARS Coronavirus 2 by RT PCR (hospital order, performed in [facility] hospital lab) *cepheid single result test* Anterior Nasal Swab     Status: Abnormal   Collection Time: 03/04/22 12:58 PM   Specimen: Anterior Nasal Swab  Result Value Ref Range Status   SARS Coronavirus 2 by RT PCR POSITIVE (A) NEGATIVE Final    Comment: (NOTE) SARS-CoV-2 target nucleic acids are DETECTED  SARS-CoV-2 RNA is generally detectable in upper respiratory specimens  during the acute phase of infection.  Positive results are indicative  of the presence of the identified virus, but do not rule out bacterial infection or co-infection with other pathogens not detected by the test.  Clinical correlation with patient history and  other  diagnostic information is necessary to determine patient infection status.  The expected result is negative.  Fact Sheet for Patients:   https://www.patel.info/   Fact Sheet for Healthcare Providers:  https://hall.com/    This test is not yet approved or cleared by the [place] and  has been authorized for detection and/or diagnosis of SARS-CoV-2 by FDA under an Emergency Use Authorization (EUA).  This EUA will remain in effect (meaning this test can be used) for the duration of  the COVID-19 declaration under Section 564(b)(1)  of the Act, 21 U.S.C. section 360-bbb-3(b)(1), unless the authorization is terminated or revoked sooner.   Performed at [facility], [address] [place], [place] [address]   Blood Culture (routine x 2)     Status: None   Collection Time: 03/04/22  1:44 PM   Specimen: BLOOD  Result Value Ref Range Status   Specimen Description BLOOD LEFT WRIST  Final   Special Requests   Final    BOTTLES DRAWN AEROBIC AND ANAEROBIC Blood Culture results may not be optimal due to an excessive volume of blood received in culture bottles   Culture   Final    NO GROWTH 5 DAYS Performed at [facility], [address] [place], [place] [address]    Report Status 03/09/2022 FINAL  Final  Urine Culture     Status: Abnormal   Collection Time: 03/05/22 11:29 AM   Specimen: Urine, Clean Catch  Result Value Ref Range Status   Specimen Description   Final    URINE, CLEAN CATCH Performed at [facility], [address], [place], [place] [address]    Special Requests   Final    NONE Performed at [facility], [address], [place],  [address]    Culture MULTIPLE SPECIES PRESENT, SUGGEST RECOLLECTION (A)  Final   Report Status 03/06/2022 FINAL  Final    Radiology Studies: No results found.   Scheduled Meds:  aspirin EC  81 mg Oral q morning   enoxaparin (LOVENOX) injection  40 mg Subcutaneous Q24H   feeding  supplement  237 mL Oral BID BM   furosemide  20 mg Intravenous Daily   insulin aspart  0-5 Units Subcutaneous QHS   insulin aspart  0-9 Units Subcutaneous TID WC   lisinopril  20 mg Oral BID   magic mouthwash w/lidocaine  5 mL Oral QID   metoprolol tartrate  50 mg Oral BID   nirmatrelvir/ritonavir EUA (renal dosing)  2 tablet Oral BID   pantoprazole  40 mg Oral BID AC   potassium chloride  40 mEq Oral Once   tamsulosin  0.4 mg Oral QPC supper   traZODone  100 mg Oral QHS   Continuous Infusions:   LOS: 4 days   Roxan Hockey M.D on 03/09/2022 at 7:01 PM  Go to [email or web address] - for contact info  [facility] - Office  [phone]  If 7PM-7AM, please contact night-coverage [email or web address] 03/09/2022, 7:01 PM

## 2022-03-09 NOTE — TOC Progression Note (Signed)
Transition of Care [facility]) - Progression Note    Patient Details  Name: RUFFIN LADA MRN: [id number] Date of Birth: 1932/05/28  Transition of [facility] CM/SW Contact  Shade Flood, LCSW Phone Number: 03/09/2022, 11:44 AM  Clinical Narrative:     TOC updated by pt's DIL late in the day on Friday that she and pt's son spoke with him and he was agreeable to SNF rehab. Pt will need to be day 11 post covid diagnosis. Pt with hospital delirium per MD. Earliest SNF dc will be 10/14. Will send out for SNF and insurance authorization closer to that time and when pt's orientation has improved.  Will follow.  Expected Discharge Plan: [place] Barriers to Discharge: Continued Medical Work up  Expected Discharge Plan and Services Expected Discharge Plan: [place] In-house Referral: Clinical Social Work   Post Acute Care Choice: Resumption of Svcs/PTA Provider Living arrangements for the past 2 months: Single Family Home                                       Social Determinants of Health (SDOH) Interventions    Readmission Risk Interventions    03/06/2022    2:29 PM  Readmission Risk Prevention Plan  Transportation Screening Complete  Home Care Screening Complete  Medication Review (RN CM) Complete

## 2022-03-09 NOTE — NC FL2 (Signed)
[place] MEDICAID FL2 LEVEL OF CARE SCREENING TOOL     IDENTIFICATION  Patient Name: Guy Roberson Birthdate: June 28, 1931 Sex: male Admission Date (Current Location): 03/04/2022  [facility] and [place] Number:  [employer] and Address:  [facility],  [address] [place] [address]      Provider Number: [id number]  Attending Physician Name and Address:  Shon Hale, MD  Relative Name and Phone Number:       Current Level of Care: Hospital Recommended Level of Care: Skilled Nursing Facility Prior Approval Number:    Date Approved/Denied:   PASRR Number: [id number]  Discharge Plan: SNF    Current Diagnoses: Patient Active Problem List   Diagnosis Date Noted   Acute exacerbation of CHF (congestive heart failure) (HCC) 03/05/2022   Generalized weakness 03/04/2022   COVID-19 virus infection 03/04/2022   Acute congestive heart failure (HCC) 03/04/2022   Hypokalemia 03/04/2022   Gross hematuria 07/07/2021   Benign prostatic hyperplasia with urinary retention 07/07/2021   Bladder diverticulum 07/07/2021   Neurogenic bladder 07/07/2021   Urinary retention 01/01/2012   Pacemaker-[employer] 12/30/2011   Acute respiratory failure (HCC) 12/29/2011   Rib fracture 12/29/2011   Hypoxemia 12/29/2011   Syncope    Complete heart block (HCC)    Hypertension    Hyperlipidemia    CAD (coronary artery disease)    Arteriosclerotic cardiovascular disease (ASCVD)    Fasting hyperglycemia    Colonic polyp    DM (diabetes mellitus) (HCC) 10/02/2009   HYPERLIPIDEMIA 09/30/2009   Essential hypertension 09/30/2009    Orientation RESPIRATION BLADDER Height & Weight     Self, Place, Situation  Normal Continent Weight: 149 lb 7.6 oz (67.8 kg) Height:  5\' 7"  (170.2 cm)  BEHAVIORAL SYMPTOMS/MOOD NEUROLOGICAL BOWEL NUTRITION STATUS      Continent Diet (see dc summary)  AMBULATORY STATUS COMMUNICATION OF NEEDS Skin   Extensive Assist Verbally Normal                        Personal Care Assistance Level of Assistance  Bathing, Feeding, Dressing Bathing Assistance: Limited assistance Feeding assistance: Independent Dressing Assistance: Limited assistance     Functional Limitations Info  Sight, Hearing, Speech Sight Info: Adequate Hearing Info: Impaired Speech Info: Adequate    SPECIAL CARE FACTORS FREQUENCY  PT (By licensed PT), OT (By licensed OT)     PT Frequency: 5x week OT Frequency: 3x week            Contractures Contractures Info: Not present    Additional Factors Info  Allergies, Psychotropic, Code Status Code Status Info: Full Allergies Info: Penicillins           Current Medications (03/09/2022):  This is the current hospital active medication list Current Facility-Administered Medications  Medication Dose Route Frequency Provider Last Rate Last Admin   0.9 %  sodium chloride infusion   Intravenous Continuous 05/09/2022, Courage, MD 50 mL/hr at 03/08/22 1736 Infusion Verify at 03/08/22 1736   acetaminophen (TYLENOL) tablet 650 mg  650 mg Oral Q6H PRN Emokpae, Ejiroghene E, MD   650 mg at 03/06/22 1152   Or   acetaminophen (TYLENOL) suppository 650 mg  650 mg Rectal Q6H PRN Emokpae, Ejiroghene E, MD       aspirin EC tablet 81 mg  81 mg Oral q morning Emokpae, Ejiroghene E, MD   81 mg at 03/09/22 0802   enoxaparin (LOVENOX) injection 40 mg  40 mg Subcutaneous Q24H Emokpae,  Ejiroghene E, MD   40 mg at 03/08/22 2151   feeding supplement (ENSURE ENLIVE / ENSURE PLUS) liquid 237 mL  237 mL Oral BID BM Emokpae, Courage, MD   237 mL at 03/09/22 0919   furosemide (LASIX) injection 20 mg  20 mg Intravenous Daily Emokpae, Courage, MD   20 mg at 03/09/22 0802   guaiFENesin-dextromethorphan (ROBITUSSIN DM) 100-10 MG/5ML syrup 5 mL  5 mL Oral Q4H PRN Adefeso, Oladapo, DO   5 mL at 03/08/22 2151   hydrALAZINE (APRESOLINE) injection 10 mg  10 mg Intravenous Q6H PRN Emokpae, Courage, MD       insulin aspart (novoLOG) injection  0-5 Units  0-5 Units Subcutaneous QHS Emokpae, Ejiroghene E, MD   2 Units at 03/06/22 2109   insulin aspart (novoLOG) injection 0-9 Units  0-9 Units Subcutaneous TID WC Emokpae, Ejiroghene E, MD   3 Units at 03/09/22 1224   lisinopril (ZESTRIL) tablet 20 mg  20 mg Oral BID Emokpae, Ejiroghene E, MD   20 mg at 03/09/22 0802   LORazepam (ATIVAN) tablet 1 mg  1 mg Oral QHS PRN Emokpae, Courage, MD       magic mouthwash w/lidocaine  5 mL Oral QID Emokpae, Courage, MD   5 mL at 03/09/22 0802   metoprolol tartrate (LOPRESSOR) tablet 50 mg  50 mg Oral BID Emokpae, Ejiroghene E, MD   50 mg at 03/08/22 2151   nirmatrelvir/ritonavir EUA (renal dosing) (PAXLOVID) 2 tablet  2 tablet Oral BID Roxan Hockey, MD   2 tablet at 03/09/22 0920   pantoprazole (PROTONIX) EC tablet 40 mg  40 mg Oral BID AC Emokpae, Courage, MD   40 mg at 03/09/22 0803   polyethylene glycol (MIRALAX / GLYCOLAX) packet 17 g  17 g Oral Daily PRN Emokpae, Ejiroghene E, MD       promethazine (PHENERGAN) tablet 12.5 mg  12.5 mg Oral Q6H PRN Emokpae, Ejiroghene E, MD   12.5 mg at 03/07/22 1005   tamsulosin (FLOMAX) capsule 0.4 mg  0.4 mg Oral QPC supper Emokpae, Courage, MD   0.4 mg at 03/08/22 1708   traZODone (DESYREL) tablet 100 mg  100 mg Oral QHS Emokpae, Courage, MD   100 mg at 03/08/22 2151     Discharge Medications: Please see discharge summary for a list of discharge medications.  Relevant Imaging Results:  Relevant Lab Results:   Additional Information SSN: [place], LCSW

## 2022-03-10 DIAGNOSIS — R531 Weakness: Secondary | ICD-10-CM | POA: Diagnosis not present

## 2022-03-10 LAB — GLUCOSE, CAPILLARY
Glucose-Capillary: 160 mg/dL — ABNORMAL HIGH (ref 70–99)
Glucose-Capillary: 201 mg/dL — ABNORMAL HIGH (ref 70–99)
Glucose-Capillary: 224 mg/dL — ABNORMAL HIGH (ref 70–99)
Glucose-Capillary: 229 mg/dL — ABNORMAL HIGH (ref 70–99)

## 2022-03-10 NOTE — Progress Notes (Signed)
Physical Therapy Treatment Patient Details Name: Guy Roberson MRN: [id number] DOB: 1932-03-21 Today's Date: 03/10/2022   History of Present Illness Guy Roberson is a 88 y.o. male with medical history significant for DM, HTN, complete heart block with pacemaker, urinary artery disease, BPH.  Patient was to the ED via EMS reports of generalized weakness, inability to ambulate.  Symptoms started about 2 days ago with cough.  This morning he was so weak he was unable to stand.  At baseline he uses a walker, he got out of bed this morning, normally his legs do not reach the floor when he is sitting on his bed, and on attempting to stand he slid off the bed to the floor.  He does not know if he hit his head.  Fall was unwitnessed.  He denies difficulty breathing.  He has chronic straining and dribbling otherwise no dysuria or frequency.  He had an episode of fecal incontinence yesterday.  No vomiting.  He got at least 2 shots maybe 3 of the COVID-vaccine.  He has not had COVID infection before.  He has had lower extremity swelling over the past several weeks to months.    PT Comments    Patient presents slightly lethargic, but able to wake up and participate with therapy requiring repeated verbal/tactile cueing.  Patient had difficulty moving legs during bed mobility due to stiffness, frequent leaning/falling backwards once seated, sitting balance improved to fair after a few minutes, but had occasional leaning backwards while completing BLE strengthening/ROM exercises with Min/mod assist.  Patient limited to a couple of side steps before having to sit due to frequent buckling of knees with poor standing balance, unable to transfer to chair using RW and required Max stand pivot to transfer to chair.  Patient tolerated sitting up in chair after therapy - nursing staff aware.  Patient will benefit from continued skilled physical therapy in hospital and recommended venue below to increase strength, balance,  endurance for safe ADLs and gait.    Recommendations for follow up therapy are one component of a multi-disciplinary discharge planning process, led by the attending physician.  Recommendations may be updated based on patient status, additional functional criteria and insurance authorization.  Follow Up Recommendations  Skilled nursing-short term rehab (<3 hours/day) Can patient physically be transported by private vehicle: No   Assistance Recommended at Discharge Intermittent Supervision/Assistance  Patient can return home with the following A lot of help with bathing/dressing/bathroom;A lot of help with walking and/or transfers;Help with stairs or ramp for entrance;Assistance with cooking/housework   Equipment Recommendations  None recommended by PT    Recommendations for Other Services       Precautions / Restrictions Precautions Precautions: Fall Restrictions Weight Bearing Restrictions: No     Mobility  Bed Mobility Overal bed mobility: Needs Assistance Bed Mobility: Supine to Sit     Supine to sit: Mod assist, Max assist     General bed mobility comments: slow labored movement with severe stiffness in legs    Transfers Overall transfer level: Needs assistance Equipment used: Rolling walker (2 wheels), 1 person hand held assist Transfers: Sit to/from Stand, Bed to chair/wheelchair/BSC Sit to Stand: Mod assist Stand pivot transfers: Max assist         General transfer comment: Patient has difficulty flexing knees due to stiffness, knees buckle when standing    Ambulation/Gait Ambulation/Gait assistance: Max assist Gait Distance (Feet): 2 Feet Assistive device: Rolling walker (2 wheels) Gait Pattern/deviations: Decreased step length -  right, Decreased step length - left, Decreased stride length, Knees buckling Gait velocity: slow     General Gait Details: patient limited to a couple of side steps due to buckling of knees with inability to maintain standing  balance   Stairs             Wheelchair Mobility    Modified Rankin (Stroke Patients Only)       Balance Overall balance assessment: Needs assistance Sitting-balance support: Feet supported, No upper extremity supported Sitting balance-Leahy Scale: Poor Sitting balance - Comments: fair/poor seated at EOB   Standing balance support: During functional activity, Bilateral upper extremity supported, Reliant on assistive device for balance Standing balance-Leahy Scale: Poor Standing balance comment: using RW                            Cognition Arousal/Alertness: Awake/alert Behavior During Therapy: WFL for tasks assessed/performed Overall Cognitive Status: Within Functional Limits for tasks assessed                                          Exercises General Exercises - Lower Extremity Long Arc Quad: Seated, AAROM, Strengthening, Both, 10 reps Hip Flexion/Marching: Seated, AAROM, Strengthening, Both, 10 reps Heel Raises: Seated, AROM, Strengthening, Both, 10 reps    General Comments        Pertinent Vitals/Pain Pain Assessment Pain Assessment: No/denies pain    Home Living                          Prior Function            PT Goals (current goals can now be found in the care plan section) Acute Rehab PT Goals Patient Stated Goal: return home PT Goal Formulation: With patient Time For Goal Achievement: 03/20/22 Potential to Achieve Goals: Fair Progress towards PT goals: Progressing toward goals    Frequency    Min 3X/week      PT Plan Current plan remains appropriate    Co-evaluation              AM-PAC PT "6 Clicks" Mobility   Outcome Measure  Help needed turning from your back to your side while in a flat bed without using bedrails?: A Lot Help needed moving from lying on your back to sitting on the side of a flat bed without using bedrails?: A Lot Help needed moving to and from a bed to a chair  (including a wheelchair)?: A Lot Help needed standing up from a chair using your arms (e.g., wheelchair or bedside chair)?: A Lot Help needed to walk in hospital room?: Total Help needed climbing 3-5 steps with a railing? : Total 6 Click Score: 10    End of Session   Activity Tolerance: Patient tolerated treatment well;Patient limited by fatigue Patient left: in chair;with call bell/phone within reach;with chair alarm set Nurse Communication: Mobility status PT Visit Diagnosis: Unsteadiness on feet (R26.81);Other abnormalities of gait and mobility (R26.89);Muscle weakness (generalized) (M62.81);History of falling (Z91.81)     Time: 0737-1062 PT Time Calculation (min) (ACUTE ONLY): 27 min  Charges:  $Therapeutic Exercise: 8-22 mins $Therapeutic Activity: 8-22 mins                     2:05 PM, 03/10/22 Lonell Grandchild, MPT Physical Therapist with [facility]  [facility] 336 [phone] office [phone] mobile phone

## 2022-03-10 NOTE — Progress Notes (Signed)
PROGRESS NOTE     Guy Roberson, is a 88 y.o. male, DOB - [birth year], ZSW:[id number]  Admit date - 03/04/2022   Admitting Physician Luba Matzen Denton Brick, MD  Outpatient Primary MD for the patient is [name], MD  LOS - 5  Chief Complaint  Patient presents with   Weakness        Brief Narrative:  88 y.o. male with medical history significant for DM, HTN, complete heart block with pacemaker,  BPH admitted on 03/04/2022 with urinary incontinence and dyspnea in the setting of COVID-19 infection -Unable to go to SNF facility until after 10 days isolation period From COVID   Plan: 1) Generalized weakness/status post fall---Multifactorial etiology suspected in a patient with COVID-19 infection and CHF  -CT head without acute findings --Pt Was hoping to go home however unable to walk safely with physical therapy and did very poorly with ambulation with family at bedside.... Very high fall risk -Patient reluctantly agrees to go to SNF rehab -Physical therapy eval appreciated recommends SNF rehab -SNF facility requires patient to be at least 10 days out from [place] diagnosis prior to be admitted to SNF (dxed 03/04/22) -Recurrent falls---PTA pt lived alone and did very poorly, patient has significant limitations with mobility related ADLs- this patient needs to continue to be monitored in the hospital until a SNF bed is obtained as she is not safe to go home with her current physcical limitations -- Patient continues to have recurrent falls even in the  hospital  03/10/22 -Hospital delirium with confusion, disorientation and agitation is improving -Remove soft wrist restraints -- Continue fall precautions -  2)COVID-19 virus infection -Previously vaccinated -Respiratory symptoms are not severe -Patient with significant weakness and malaise -Completed Paxlovid -Continue bronchodilators -No Significant hypoxia and chest x-ray is not suggestive of COVID-pneumonia---so hold off on  steroids  3)HFpEF--Acute on chronic diastolic congestive heart failure (HCC) -Echo with preserved EF of 50 to 55% with severe asymmetric LVH and mild pulmonary hypertension -No aortic stenosis -Lower extremity edema and dyspnea improving with diuresis-Dopplers without DVT -Admission chest x-ray was consistent with CHF  -Daily weights and fluid input and output monitoring -Continue lisinopril and metoprolol --Diuresed with Lasix initially -Oral intake is poor may need gentle IV fluid hydration -Avoid additional aggressive diuresis until oral intake improves  4)Hypokalemia/Hypophosphatemia/hypomagnesemia -In the setting of diuretic use -Replace and recheck  5)Hypertension Stable. Continue metoprolol , lisinopril  -HCTZ discontinued   6)DM (diabetes mellitus) ([place]) .  A1c 6.5 reflecting good diabetic control PTA -Use Novolog/Humalog Sliding scale insulin with Accu-Cheks/Fingersticks as ordered   7)Urinary retention--- c/n  Flomax    8)Possible UTI----UA was somewhat suggestive of UTI -However urine culture with contaminated/mixed growth -Treated with IV Rocephin from 03/05/2022 through 03/07/2022 inclusive for 3 doses -No further antibiotics indicated  9)Thrombocytopenia---??  Related to infection -Platelets have normalized  10)Hospital-acquired delirium with increased confusion and falls--- - CT head post fall without acute abnormalities -Hospital delirium with confusion, disorientation and agitation is improving overall -Remove soft wrist restraints -- Continue fall precautions  Trazodone nightly -Lorazepam prn as ordered -Haldol was ineffective  Disposition/Need for in-Hospital Stay- patient unable to be discharged at this time due to - -Generalized weakness and falls at home--- awaiting transfer to SNF rehab --SNF facility requires patient to be at least 10 days out from COVID diagnosis prior to be admitted to SNF (dxed 03/04/22) ---  Patient continues to have recurrent  falls even in the  hospital  Status is: Inpatient  Disposition: The patient is from: Home              Anticipated d/c is to: Home              Anticipated d/c date is: 2 days              Patient currently is not medically stable to d/c. Barriers: Not Clinically Stable-   Code Status :  -  Code Status: Partial Code   Family Communication: Previously discussed with daughter in law Dainel Arcidiacono----   DVT Prophylaxis  :   - SCDs   enoxaparin (LOVENOX) injection 40 mg Start: 03/04/22 2045  Lab Results  Component Value Date   PLT 181 03/08/2022   Inpatient Medications  Scheduled Meds:  aspirin EC  81 mg Oral q morning   enoxaparin (LOVENOX) injection  40 mg Subcutaneous Q24H   feeding supplement  237 mL Oral BID BM   furosemide  20 mg Intravenous Daily   insulin aspart  0-5 Units Subcutaneous QHS   insulin aspart  0-9 Units Subcutaneous TID WC   lisinopril  20 mg Oral BID   magic mouthwash w/lidocaine  5 mL Oral QID   metoprolol tartrate  50 mg Oral BID   pantoprazole  40 mg Oral BID AC   tamsulosin  0.4 mg Oral QPC supper   Continuous Infusions:   PRN Meds:.acetaminophen **OR** acetaminophen, guaiFENesin-dextromethorphan, hydrALAZINE, LORazepam, polyethylene glycol, promethazine   Anti-infectives (From admission, onward)    Start     Dose/Rate Route Frequency Ordered Stop   03/05/22 1800  cefTRIAXone (ROCEPHIN) 1 g in sodium chloride 0.9 % 100 mL IVPB        1 g 200 mL/hr over 30 Minutes Intravenous Every 24 hours 03/05/22 1711 03/08/22 0854   03/05/22 1000  nirmatrelvir/ritonavir EUA (renal dosing) (PAXLOVID) 2 tablet        2 tablet Oral 2 times daily 03/05/22 0735 03/09/22 2143   03/04/22 2200  nirmatrelvir/ritonavir EUA (PAXLOVID) 3 tablet  Status:  Discontinued        3 tablet Oral 2 times daily 03/04/22 1927 03/05/22 0735       Subjective: Guy Roberson today has no fevers, no emesis,  No chest pain,    03/10/22 -Patient's son and daughter-in-law at  bedside -Less confused -More cooperative -Oral intake is fair but not great -Sitting up in the chair  Objective: Vitals:   03/09/22 2113 03/10/22 0500 03/10/22 0548 03/10/22 0924  BP: 106/64  (!) 122/45 (!) 182/62  Pulse: 100  78 72  Resp: 20  18   Temp: 98.3 F (36.8 C)  98 F (36.7 C)   TempSrc:      SpO2: 95%  92%   Weight:  68 kg    Height:        Intake/Output Summary (Last 24 hours) at 03/10/2022 1214 Last data filed at 03/10/2022 9373 Gross per 24 hour  Intake --  Output 1000 ml  Net -1000 ml   Filed Weights   03/08/22 0339 03/09/22 0500 03/10/22 0500  Weight: 69 kg 67.8 kg 68 kg    Physical Exam Gen:- Awake, alert, less confused, no conversational dyspnea HEENT:- [place].AT, No sclera icterus Neck-Supple Neck,No JVD,.  Lungs-improving air movement, no wheezing  CV- S1, S2 normal, regular , prior CABG scar, pacemaker in situ Abd-  +ve B.Sounds, Abd Soft, No tenderness,    Extremity/Skin:-Improving edema, pedal pulses present  Psych--- less confused, less disoriented, more cooperative Neuro-generalized weakness,  no new focal deficits, no tremors GU-urinary incontinence  Data Reviewed: I have personally reviewed following labs and imaging studies  CBC: Recent Labs  Lab 03/04/22 1344 03/05/22 0342 03/06/22 0428 03/07/22 0757 03/08/22 0716  WBC 9.0 9.4 6.9 6.7 5.4  NEUTROABS 7.2 7.2 4.7 4.7 3.5  HGB 14.9 14.0 14.2 14.9 15.1  HCT 43.2 41.3 41.2 42.3 42.9  MCV 93.3 94.5 92.0 89.8 89.4  PLT 143* 157 137* 166 181   Basic Metabolic Panel: Recent Labs  Lab 03/05/22 0342 03/06/22 0428 03/07/22 0757 03/07/22 0759 03/08/22 0716 03/08/22 0717 03/09/22 0501  NA 136 134* 131* 131* 135 136 136  K 3.1* 2.9* 2.7* 2.8* 3.0* 3.0* 3.5  CL 101 92* 90* 90* 95* 95* 100  CO2 26 30 27 27 25 27 26   GLUCOSE 112* 109* 105* 105* 123* 124* 137*  BUN 22 24* 25* 26* 26* 27* 30*  CREATININE 0.96 1.04 0.90 0.93 1.01 1.02 0.95  CALCIUM 9.6 9.4 9.2 9.2 9.5 9.5 9.2  MG 1.9  1.8 1.6*  --  2.2  --   --   PHOS 2.3* 3.2 2.5 2.5 3.3 3.4  --    GFR: Estimated Creatinine Clearance: 51.2 mL/min (by C-G formula based on SCr of 0.95 mg/dL). Liver Function Tests: Recent Labs  Lab 03/05/22 0342 03/06/22 0428 03/07/22 0757 03/07/22 0759 03/08/22 0716 03/08/22 0717 03/09/22 0501  AST 23 28 43*  --  79*  --  71*  ALT 16 18 22   --  32  --  39  ALKPHOS 50 50 55  --  55  --  49  BILITOT 1.0 1.1 1.2  --  1.6*  --  1.3*  PROT 6.0* 6.0* 6.3*  --  6.3*  --  5.9*  ALBUMIN 3.3* 3.2* 3.5 3.6 3.6 3.6 3.3*   HbA1C: No results for input(s): "HGBA1C" in the last 72 hours.  Recent Results (from the past 240 hour(s))  Blood Culture (routine x 2)     Status: None   Collection Time: 03/04/22 12:44 PM   Specimen: BLOOD  Result Value Ref Range Status   Specimen Description BLOOD RIGHT WRIST  Final   Special Requests   Final    BOTTLES DRAWN AEROBIC AND ANAEROBIC Blood Culture adequate volume   Culture   Final    NO GROWTH 5 DAYS Performed at [facility], [address] [place], [address] [place]    Report Status 03/09/2022 FINAL  Final  SARS Coronavirus 2 by RT PCR (hospital order, performed in [facility] hospital lab) *cepheid single result test* Anterior Nasal Swab     Status: Abnormal   Collection Time: 03/04/22 12:58 PM   Specimen: Anterior Nasal Swab  Result Value Ref Range Status   SARS Coronavirus 2 by RT PCR POSITIVE (A) NEGATIVE Final    Comment: (NOTE) SARS-CoV-2 target nucleic acids are DETECTED  SARS-CoV-2 RNA is generally detectable in upper respiratory specimens  during the acute phase of infection.  Positive results are indicative  of the presence of the identified virus, but do not rule out bacterial infection or co-infection with other pathogens not detected by the test.  Clinical correlation with patient history and  other diagnostic information is necessary to determine patient infection status.  The expected result is negative.  Fact Sheet for  Patients:   [facility]   Fact Sheet for Healthcare Providers:   05/04/22    This test is not yet approved or cleared by the [employer] and  has been authorized for detection and/or diagnosis of SARS-CoV-2 by FDA under an Emergency Use Authorization (EUA).  This EUA will remain in effect (meaning this test can be used) for the duration of  the COVID-19 declaration under Section 564(b)(1)  of the Act, 21 U.S.C. section 360-bbb-3(b)(1), unless the authorization is terminated or revoked sooner.   Performed at [facility], [address] [place], [place] [address]   Blood Culture (routine x 2)     Status: None   Collection Time: 03/04/22  1:44 PM   Specimen: BLOOD  Result Value Ref Range Status   Specimen Description BLOOD LEFT WRIST  Final   Special Requests   Final    BOTTLES DRAWN AEROBIC AND ANAEROBIC Blood Culture results may not be optimal due to an excessive volume of blood received in culture bottles   Culture   Final    NO GROWTH 5 DAYS Performed at [facility], [address], [place], [place] [address]    Report Status 03/09/2022 FINAL  Final  Urine Culture     Status: Abnormal   Collection Time: 03/05/22 11:29 AM   Specimen: Urine, Clean Catch  Result Value Ref Range Status   Specimen Description   Final    URINE, CLEAN CATCH Performed at [facility], [address], [place], [place] [address]    Special Requests   Final    NONE Performed at [facility], [address] [place], [place] [address]    Culture MULTIPLE SPECIES PRESENT, SUGGEST RECOLLECTION (A)  Final   Report Status 03/06/2022 FINAL  Final    Radiology Studies: No results found. Scheduled Meds:  aspirin EC  81 mg Oral q morning   enoxaparin (LOVENOX) injection  40 mg Subcutaneous Q24H   feeding supplement  237 mL Oral BID BM   furosemide  20 mg Intravenous Daily   insulin aspart  0-5 Units Subcutaneous QHS   insulin aspart   0-9 Units Subcutaneous TID WC   lisinopril  20 mg Oral BID   magic mouthwash w/lidocaine  5 mL Oral QID   metoprolol tartrate  50 mg Oral BID   pantoprazole  40 mg Oral BID AC   tamsulosin  0.4 mg Oral QPC supper   Continuous Infusions:   LOS: 5 days   Shon Hale M.D on 03/10/2022 at 12:14 PM  Go to [email or web address] - for contact info  [facility] - Office  [phone]  If 7PM-7AM, please contact night-coverage [email or web address] 03/10/2022, 12:14 PM

## 2022-03-11 DIAGNOSIS — R531 Weakness: Secondary | ICD-10-CM | POA: Diagnosis not present

## 2022-03-11 LAB — GLUCOSE, CAPILLARY
Glucose-Capillary: 165 mg/dL — ABNORMAL HIGH (ref 70–99)
Glucose-Capillary: 129 mg/dL — ABNORMAL HIGH (ref 70–99)
Glucose-Capillary: 151 mg/dL — ABNORMAL HIGH (ref 70–99)
Glucose-Capillary: 187 mg/dL — ABNORMAL HIGH (ref 70–99)

## 2022-03-11 LAB — RENAL FUNCTION PANEL
Albumin: 3.2 g/dL — ABNORMAL LOW (ref 3.5–5.0)
Anion gap: 7 (ref 5–15)
BUN: 31 mg/dL — ABNORMAL HIGH (ref 8–23)
CO2: 27 mmol/L (ref 22–32)
Calcium: 9.5 mg/dL (ref 8.9–10.3)
Chloride: 102 mmol/L (ref 98–111)
Creatinine, Ser: 0.9 mg/dL (ref 0.61–1.24)
GFR, Estimated: >60 mL/min (ref >60)
Glucose, Bld: 162 mg/dL — ABNORMAL HIGH (ref 70–99)
Phosphorus: 1.7 mg/dL — ABNORMAL LOW (ref 2.5–4.6)
Potassium: 3.3 mmol/L — ABNORMAL LOW (ref 3.5–5.1)
Sodium: 136 mmol/L (ref 135–145)

## 2022-03-11 LAB — CBC
HCT: 39.2 % (ref 39.0–52.0)
Hemoglobin: 13.3 g/dL (ref 13.0–17.0)
MCH: 31.5 pg (ref 26.0–34.0)
MCHC: 33.9 g/dL (ref 30.0–36.0)
MCV: 92.9 fL (ref 80.0–100.0)
Platelets: 192 10*3/uL (ref 150–400)
RBC: 4.22 MIL/uL (ref 4.22–5.81)
RDW: 12.9 % (ref 11.5–15.5)
WBC: 9.5 10*3/uL (ref 4.0–10.5)
nRBC: 0 % (ref 0.0–0.2)

## 2022-03-11 MED ORDER — FUROSEMIDE 20 MG PO TABS
20.0000 mg | ORAL_TABLET | Freq: Every day | ORAL | Status: DC
  Administered 2022-03-12 – 2022-03-14 (×3): 20 mg via ORAL
  Filled 2022-03-11 (×3): qty 1

## 2022-03-11 MED ORDER — TRAZODONE HCL 50 MG PO TABS
50.0000 mg | ORAL_TABLET | Freq: Every day | ORAL | Status: DC
  Administered 2022-03-11 – 2022-03-13 (×3): 50 mg via ORAL
  Filled 2022-03-11 (×3): qty 1

## 2022-03-11 NOTE — Progress Notes (Signed)
PROGRESS NOTE     Guy Roberson, is a 88 y.o. male, DOB - [birth year], GPQ:[id number]  Admit date - 03/04/2022   Admitting Physician Arek Spadafore Mariea Clonts, MD  Outpatient Primary MD for the patient is [name], MD  LOS - 6  Chief Complaint  Patient presents with   Weakness        Brief Narrative:  88 y.o. male with medical history significant for DM, HTN, complete heart block with pacemaker,  BPH admitted on 03/04/2022 with urinary incontinence and dyspnea in the setting of COVID-19 infection -Unable to go to SNF facility until after 10 days isolation period From COVID   Plan: 1) Generalized weakness/status post fall---Multifactorial etiology suspected in a patient with COVID-19 infection and CHF  -CT head without acute findings --Pt Was hoping to go home however unable to walk safely with physical therapy and did very poorly with ambulation with family at bedside.... Very high fall risk -Patient reluctantly agrees to go to SNF rehab -Physical therapy eval appreciated recommends SNF rehab -SNF facility requires patient to be at least 10 days out from COVID diagnosis prior to be admitted to SNF (dxed 03/04/22) -Recurrent falls---PTA pt lived alone and did very poorly, patient has significant limitations with mobility related ADLs- this patient needs to continue to be monitored in the hospital until a SNF bed is obtained as she is not safe to go home with her current physcical limitations -- Patient continues to have recurrent falls even in the  hospital  03/11/22 -Hospital delirium with confusion, disorientation and agitation is improving -More cooperative, more coherent, no longer requiring restraints -Not requiring sitter -- Continue fall precautions -  2)COVID-19 virus infection -Previously vaccinated -Respiratory symptoms are not severe -Patient with significant weakness and malaise -Completed Paxlovid -Continue bronchodilators -No Significant hypoxia and chest x-ray is  not suggestive of COVID-pneumonia---so hold off on steroids  3)HFpEF--Acute on chronic diastolic congestive heart failure (HCC) -Echo with preserved EF of 50 to 55% with severe asymmetric LVH and mild pulmonary hypertension -No aortic stenosis -Lower extremity edema and dyspnea improving with diuresis-Dopplers without DVT -Admission chest x-ray was consistent with CHF  -Daily weights and fluid input and output monitoring -Continue lisinopril and metoprolol --Diuresed with Lasix initially -Oral intake is poor may need gentle IV fluid hydration -Avoid additional aggressive diuresis until oral intake improves  4)Hypokalemia/Hypophosphatemia/hypomagnesemia -In the setting of diuretic use -Replace and recheck  5)Hypertension Stable. Continue metoprolol , lisinopril  -HCTZ discontinued   6)DM (diabetes mellitus) (HCC) .  A1c 6.5 reflecting good diabetic control PTA -Use Novolog/Humalog Sliding scale insulin with Accu-Cheks/Fingersticks as ordered   7)Urinary retention--- c/n  Flomax    8)Possible UTI----UA was somewhat suggestive of UTI -However urine culture with contaminated/mixed growth -Treated with IV Rocephin from 03/05/2022 through 03/07/2022 inclusive for 3 doses -No further antibiotics indicated  9)Thrombocytopenia---??  Related to infection -Platelets have normalized  10)Hospital-acquired delirium with increased confusion and falls--- - CT head post fall without acute abnormalities -Hospital delirium with confusion, disorientation and agitation is improving overall -Remove soft wrist restraints -- Continue fall precautions  Trazodone nightly -Lorazepam prn as ordered -Haldol was ineffective  Disposition/Need for in-Hospital Stay- patient unable to be discharged at this time due to - -Generalized weakness and falls at home--- awaiting transfer to SNF rehab --SNF facility requires patient to be at least 10 days out from COVID diagnosis prior to be admitted to SNF (dxed  03/04/22) ---  Patient continues to have recurrent falls even in the  hospital  Status is: Inpatient   Disposition: The patient is from: Home              Anticipated d/c is to: Home              Anticipated d/c date is: 2 days              Patient currently is not medically stable to d/c. Barriers: Not Clinically Stable-   Code Status :  -  Code Status: Partial Code   Family Communication: Previously discussed with daughter in law [name]----   DVT Prophylaxis  :   - SCDs   enoxaparin (LOVENOX) injection 40 mg Start: 03/04/22 2045  Lab Results  Component Value Date   PLT 192 03/11/2022   Inpatient Medications  Scheduled Meds:  aspirin EC  81 mg Oral q morning   enoxaparin (LOVENOX) injection  40 mg Subcutaneous Q24H   feeding supplement  237 mL Oral BID BM   furosemide  20 mg Intravenous Daily   insulin aspart  0-5 Units Subcutaneous QHS   insulin aspart  0-9 Units Subcutaneous TID WC   lisinopril  20 mg Oral BID   magic mouthwash w/lidocaine  5 mL Oral QID   metoprolol tartrate  50 mg Oral BID   pantoprazole  40 mg Oral BID AC   tamsulosin  0.4 mg Oral QPC supper   Continuous Infusions:   PRN Meds:.acetaminophen **OR** acetaminophen, guaiFENesin-dextromethorphan, hydrALAZINE, LORazepam, polyethylene glycol, promethazine   Anti-infectives (From admission, onward)    Start     Dose/Rate Route Frequency Ordered Stop   03/05/22 1800  cefTRIAXone (ROCEPHIN) 1 g in sodium chloride 0.9 % 100 mL IVPB        1 g 200 mL/hr over 30 Minutes Intravenous Every 24 hours 03/05/22 1711 03/08/22 0854   03/05/22 1000  nirmatrelvir/ritonavir EUA (renal dosing) (PAXLOVID) 2 tablet        2 tablet Oral 2 times daily 03/05/22 0735 03/09/22 2143   03/04/22 2200  nirmatrelvir/ritonavir EUA (PAXLOVID) 3 tablet  Status:  Discontinued        3 tablet Oral 2 times daily 03/04/22 1927 03/05/22 0735       Subjective: Cedar Ditullio today has no fevers, no emesis,  No chest pain,     03/11/22 -Hospital delirium with confusion, disorientation and agitation is improving -More cooperative, more coherent, no longer requiring restraints -Not requiring sitter -- Continue fall precautions -  Objective: Vitals:   03/10/22 2012 03/11/22 0535 03/11/22 0907 03/11/22 1643  BP: (!) 170/94 (!) 161/56 (!) 167/52 (!) 176/68  Pulse: 96 80 71 61  Resp: 18 (!) 21  16  Temp: 98.3 F (36.8 C) 98.2 F (36.8 C)    TempSrc:      SpO2: 96% 92%  97%  Weight:  67.5 kg    Height:        Intake/Output Summary (Last 24 hours) at 03/11/2022 1927 Last data filed at 03/11/2022 1200 Gross per 24 hour  Intake 240 ml  Output 800 ml  Net -560 ml   Filed Weights   03/09/22 0500 03/10/22 0500 03/11/22 0535  Weight: 67.8 kg 68 kg 67.5 kg    Physical Exam Gen:- Awake, alert,  no conversational dyspnea HEENT:- [place].AT, No sclera icterus Neck-Supple Neck,No JVD,.  Lungs-improving air movement, no wheezing  CV- S1, S2 normal, regular , prior CABG scar, pacemaker in situ Abd-  +ve B.Sounds, Abd Soft, No tenderness,    Extremity/Skin:-Improving edema,  pedal pulses present  Psych--- much less confused, much less disoriented, more cooperative Neuro-generalized weakness, no new focal deficits, no tremors GU-urinary incontinence  Data Reviewed: I have personally reviewed following labs and imaging studies  CBC: Recent Labs  Lab 03/05/22 0342 03/06/22 0428 03/07/22 0757 03/08/22 0716 03/11/22 0403  WBC 9.4 6.9 6.7 5.4 9.5  NEUTROABS 7.2 4.7 4.7 3.5  --   HGB 14.0 14.2 14.9 15.1 13.3  HCT 41.3 41.2 42.3 42.9 39.2  MCV 94.5 92.0 89.8 89.4 92.9  PLT 157 137* 166 181 192   Basic Metabolic Panel: Recent Labs  Lab 03/05/22 0342 03/06/22 0428 03/07/22 0757 03/07/22 0759 03/08/22 0716 03/08/22 0717 03/09/22 0501 03/11/22 0403  NA 136 134* 131* 131* 135 136 136 136  K 3.1* 2.9* 2.7* 2.8* 3.0* 3.0* 3.5 3.3*  CL 101 92* 90* 90* 95* 95* 100 102  CO2 26 30 27 27 25 27 26 27    GLUCOSE 112* 109* 105* 105* 123* 124* 137* 162*  BUN 22 24* 25* 26* 26* 27* 30* 31*  CREATININE 0.96 1.04 0.90 0.93 1.01 1.02 0.95 0.90  CALCIUM 9.6 9.4 9.2 9.2 9.5 9.5 9.2 9.5  MG 1.9 1.8 1.6*  --  2.2  --   --   --   PHOS 2.3* 3.2 2.5 2.5 3.3 3.4  --  1.7*   GFR: Estimated Creatinine Clearance: 54.1 mL/min (by C-G formula based on SCr of 0.9 mg/dL). Liver Function Tests: Recent Labs  Lab 03/05/22 0342 03/06/22 0428 03/07/22 0757 03/07/22 0759 03/08/22 0716 03/08/22 0717 03/09/22 0501 03/11/22 0403  AST 23 28 43*  --  79*  --  71*  --   ALT 16 18 22   --  32  --  39  --   ALKPHOS 50 50 55  --  55  --  49  --   BILITOT 1.0 1.1 1.2  --  1.6*  --  1.3*  --   PROT 6.0* 6.0* 6.3*  --  6.3*  --  5.9*  --   ALBUMIN 3.3* 3.2* 3.5 3.6 3.6 3.6 3.3* 3.2*   HbA1C: No results for input(s): "HGBA1C" in the last 72 hours.  Recent Results (from the past 240 hour(s))  Blood Culture (routine x 2)     Status: None   Collection Time: 03/04/22 12:44 PM   Specimen: BLOOD  Result Value Ref Range Status   Specimen Description BLOOD RIGHT WRIST  Final   Special Requests   Final    BOTTLES DRAWN AEROBIC AND ANAEROBIC Blood Culture adequate volume   Culture   Final    NO GROWTH 5 DAYS Performed at [facility], [address] [place], [address] [place]    Report Status 03/09/2022 FINAL  Final  SARS Coronavirus 2 by RT PCR (hospital order, performed in [facility] hospital lab) *cepheid single result test* Anterior Nasal Swab     Status: Abnormal   Collection Time: 03/04/22 12:58 PM   Specimen: Anterior Nasal Swab  Result Value Ref Range Status   SARS Coronavirus 2 by RT PCR POSITIVE (A) NEGATIVE Final    Comment: (NOTE) SARS-CoV-2 target nucleic acids are DETECTED  SARS-CoV-2 RNA is generally detectable in upper respiratory specimens  during the acute phase of infection.  Positive results are indicative  of the presence of the identified virus, but do not rule out bacterial infection or  co-infection with other pathogens not detected by the test.  Clinical correlation with patient history and  other diagnostic  information is necessary to determine patient infection status.  The expected result is negative.  Fact Sheet for Patients:   https://www.patel.info/   Fact Sheet for Healthcare Providers:   https://hall.com/    This test is not yet approved or cleared by the [place] FDA and  has been authorized for detection and/or diagnosis of SARS-CoV-2 by FDA under an Emergency Use Authorization (EUA).  This EUA will remain in effect (meaning this test can be used) for the duration of  the COVID-19 declaration under Section 564(b)(1)  of the Act, 21 U.S.C. section 360-bbb-3(b)(1), unless the authorization is terminated or revoked sooner.   Performed at [facility], [address], [place], [place] [address]   Blood Culture (routine x 2)     Status: None   Collection Time: 03/04/22  1:44 PM   Specimen: BLOOD  Result Value Ref Range Status   Specimen Description BLOOD LEFT WRIST  Final   Special Requests   Final    BOTTLES DRAWN AEROBIC AND ANAEROBIC Blood Culture results may not be optimal due to an excessive volume of blood received in culture bottles   Culture   Final    NO GROWTH 5 DAYS Performed at [facility], [address] [place], [place] [address]    Report Status 03/09/2022 FINAL  Final  Urine Culture     Status: Abnormal   Collection Time: 03/05/22 11:29 AM   Specimen: Urine, Clean Catch  Result Value Ref Range Status   Specimen Description   Final    URINE, CLEAN CATCH Performed at [facility], [address], [place], [place] [address]    Special Requests   Final    NONE Performed at [facility], [address] [place], [place] [address]    Culture MULTIPLE SPECIES PRESENT, SUGGEST RECOLLECTION (A)  Final   Report Status 03/06/2022 FINAL  Final    Radiology Studies: No results found. Scheduled  Meds:  aspirin EC  81 mg Oral q morning   enoxaparin (LOVENOX) injection  40 mg Subcutaneous Q24H   feeding supplement  237 mL Oral BID BM   furosemide  20 mg Intravenous Daily   insulin aspart  0-5 Units Subcutaneous QHS   insulin aspart  0-9 Units Subcutaneous TID WC   lisinopril  20 mg Oral BID   magic mouthwash w/lidocaine  5 mL Oral QID   metoprolol tartrate  50 mg Oral BID   pantoprazole  40 mg Oral BID AC   tamsulosin  0.4 mg Oral QPC supper   Continuous Infusions:   LOS: 6 days   Roxan Hockey M.D on 03/11/2022 at 7:27 PM  Go to [email or web address] - for contact info  [facility] - Office  [phone]  If 7PM-7AM, please contact night-coverage [email or web address] 03/11/2022, 7:27 PM

## 2022-03-12 DIAGNOSIS — R531 Weakness: Secondary | ICD-10-CM | POA: Diagnosis not present

## 2022-03-12 LAB — GLUCOSE, CAPILLARY
Glucose-Capillary: 148 mg/dL — ABNORMAL HIGH (ref 70–99)
Glucose-Capillary: 166 mg/dL — ABNORMAL HIGH (ref 70–99)
Glucose-Capillary: 197 mg/dL — ABNORMAL HIGH (ref 70–99)
Glucose-Capillary: 217 mg/dL — ABNORMAL HIGH (ref 70–99)
Glucose-Capillary: 124 mg/dL — ABNORMAL HIGH (ref 70–99)

## 2022-03-12 LAB — BASIC METABOLIC PANEL
Anion gap: 5 (ref 5–15)
BUN: 33 mg/dL — ABNORMAL HIGH (ref 8–23)
CO2: 30 mmol/L (ref 22–32)
Calcium: 9.3 mg/dL (ref 8.9–10.3)
Chloride: 100 mmol/L (ref 98–111)
Creatinine, Ser: 0.83 mg/dL (ref 0.61–1.24)
GFR, Estimated: >60 mL/min (ref >60)
Glucose, Bld: 130 mg/dL — ABNORMAL HIGH (ref 70–99)
Potassium: 3.1 mmol/L — ABNORMAL LOW (ref 3.5–5.1)
Sodium: 135 mmol/L (ref 135–145)

## 2022-03-12 MED ORDER — POTASSIUM CHLORIDE CRYS ER 20 MEQ PO TBCR
40.0000 meq | EXTENDED_RELEASE_TABLET | Freq: Once | ORAL | Status: AC
  Administered 2022-03-12: 40 meq via ORAL
  Filled 2022-03-12: qty 2

## 2022-03-12 NOTE — Progress Notes (Signed)
PROGRESS NOTE     Guy Roberson, is a 88 y.o. male, DOB - 1931/12/31, NLG:[id number]  Admit date - 03/04/2022   Admitting Physician Courage Mariea Clonts, MD  Outpatient Primary MD for the patient is [name], MD  LOS - 7  Chief Complaint  Patient presents with   Weakness      Subjective: [name] ,   On 03/12/2022 -Currently awake alert x2, cooperative, agitation confusion has improved  -Not requiring sitter -- Continue fall precautions -  Brief Narrative:  88 y.o. male with medical history significant for DM, HTN, complete heart block with pacemaker,  BPH admitted on 03/04/2022 with urinary incontinence and dyspnea in the setting of COVID-19 infection -Unable to go to SNF facility until after 10 days isolation period From COVID   Plan: 1) Generalized weakness/status post fall---Multifactorial etiology suspected in a patient with COVID-19 infection and CHF  -Currently stable -CT head without acute findings --Pt Was hoping to go home however unable to walk safely with physical therapy and did very poorly with ambulation with family at bedside.... Very high fall risk -Patient reluctantly agrees to go to SNF rehab  -Physical therapy eval appreciated recommends SNF rehab -SNF facility requires patient to be at least 10 days out from COVID diagnosis prior to be admitted to SNF (dxed 03/04/22) -Recurrent falls---PTA pt lived alone and did very poorly, patient has significant limitations with mobility related ADLs- this patient needs to continue to be monitored in the hospital until a SNF bed is obtained as she is not safe to go home with her current physcical limitations -- Patient continues to have recurrent falls even in the  hospital  03/12/2022 -Much more cooperative, mentation at baseline -Not requiring sitter -- Continue fall precautions -  2)COVID-19 virus infection -Previously vaccinated -Respiratory symptoms are not severe -Patient with significant weakness and  malaise -Completed Paxlovid -Continue bronchodilators -Guy Significant hypoxia and chest x-ray is not suggestive of COVID-pneumonia---so hold off on steroids  3)HFpEF--Acute on chronic diastolic congestive heart failure (HCC) -Echo with preserved EF of 50 to 55% with severe asymmetric LVH and mild pulmonary hypertension -Guy aortic stenosis -Lower extremity edema and dyspnea improving with diuresis-Dopplers without DVT -Admission chest x-ray was consistent with CHF  -Daily weights and fluid input and output monitoring -Continue lisinopril and metoprolol --Diuresed with Lasix initially -Oral intake is poor may need gentle IV fluid hydration -Avoid additional aggressive diuresis until oral intake improves  4)Hypokalemia/Hypophosphatemia/hypomagnesemia -In the setting of diuretic use -Replace and recheck  5)Hypertension Stable. Continue metoprolol , lisinopril  -HCTZ discontinued   6)DM (diabetes mellitus) (HCC) .  A1c 6.5 reflecting good diabetic control PTA -Use Novolog/Humalog Sliding scale insulin with Accu-Cheks/Fingersticks as ordered   7)Urinary retention--- c/n  Flomax    8)Possible UTI----UA was somewhat suggestive of UTI -However urine culture with contaminated/mixed growth -Treated with IV Rocephin from 03/05/2022 through 03/07/2022 inclusive for 3 doses -Guy further antibiotics indicated  9)Thrombocytopenia---??  Related to infection -Platelets have normalized  10)Hospital-acquired delirium with increased confusion and falls--- - CT head post fall without acute abnormalities -Hospital delirium with confusion, disorientation and agitation is improving overall -Remove soft wrist restraints -- Continue fall precautions  Trazodone nightly -Lorazepam prn as ordered -Haldol was ineffective  Disposition/Need for in-Hospital Stay- patient unable to be discharged at this time due to - -Generalized weakness and falls at home--- awaiting transfer to SNF rehab --SNF facility  requires patient to be at least 10 days out from COVID diagnosis prior to be  admitted to SNF (dxed 03/04/22) ---  Patient continues to have recurrent falls even in the  hospital  Status is: Inpatient   Disposition: The patient is from: Home              Anticipated d/c is to: Home              Anticipated d/c date is: 2 days              Patient currently is not medically stable to d/c. Barriers: Not Clinically Stable-   Code Status :  -  Code Status: Partial Code   Family Communication: Previously discussed with daughter in law [name]----   DVT Prophylaxis  :   - SCDs   enoxaparin (LOVENOX) injection 40 mg Start: 03/04/22 2045  Lab Results  Component Value Date   PLT 192 03/11/2022   Inpatient Medications  Scheduled Meds:  aspirin EC  81 mg Oral q morning   enoxaparin (LOVENOX) injection  40 mg Subcutaneous Q24H   feeding supplement  237 mL Oral BID BM   furosemide  20 mg Oral Daily   insulin aspart  0-5 Units Subcutaneous QHS   insulin aspart  0-9 Units Subcutaneous TID WC   lisinopril  20 mg Oral BID   magic mouthwash w/lidocaine  5 mL Oral QID   metoprolol tartrate  50 mg Oral BID   pantoprazole  40 mg Oral BID AC   tamsulosin  0.4 mg Oral QPC supper   traZODone  50 mg Oral QHS   Continuous Infusions:   PRN Meds:.acetaminophen **OR** acetaminophen, guaiFENesin-dextromethorphan, hydrALAZINE, LORazepam, polyethylene glycol, promethazine   Anti-infectives (From admission, onward)    Start     Dose/Rate Route Frequency Ordered Stop   03/05/22 1800  cefTRIAXone (ROCEPHIN) 1 g in sodium chloride 0.9 % 100 mL IVPB        1 g 200 mL/hr over 30 Minutes Intravenous Every 24 hours 03/05/22 1711 03/08/22 0854   03/05/22 1000  nirmatrelvir/ritonavir EUA (renal dosing) (PAXLOVID) 2 tablet        2 tablet Oral 2 times daily 03/05/22 0735 03/09/22 2143   03/04/22 2200  nirmatrelvir/ritonavir EUA (PAXLOVID) 3 tablet  Status:  Discontinued        3 tablet Oral 2 times daily  03/04/22 1927 03/05/22 0735         Objective: Vitals:   03/11/22 0907 03/11/22 1643 03/11/22 2200 03/12/22 0532  BP: (!) 167/52 (!) 176/68 (!) 150/70 (!) 161/58  Pulse: 71 61 62 60  Resp:  16 20 20   Temp:   (!) 97.4 F (36.3 C) 97.8 F (36.6 C)  TempSrc:    Oral  SpO2:  97% 98% 93%  Weight:    66.3 kg  Height:        Intake/Output Summary (Last 24 hours) at 03/12/2022 1255 Last data filed at 03/12/2022 0800 Gross per 24 hour  Intake 240 ml  Output 300 ml  Net -60 ml   Filed Weights   03/10/22 0500 03/11/22 0535 03/12/22 0532  Weight: 68 kg 67.5 kg 66.3 kg       Physical Exam:   General:  AAO x 2,  cooperative, Guy distress;   HEENT:  Normocephalic, PERRL, otherwise with in Normal limits   Neuro:  CNII-XII intact. , normal motor and sensation, reflexes intact   Lungs:   Clear to auscultation BL, Respirations unlabored,  Guy wheezes / crackles  Cardio:    S1/S2, RRR, Guy murmure,  Guy Rubs or Gallops   Abdomen:  Soft, non-tender, bowel sounds active all four quadrants, Guy guarding or peritoneal signs.  Muscular  skeletal:  Limited exam -global generalized weaknesses - in bed, able to move all 4 extremities,   2+ pulses,  symmetric, Guy pitting edema  Skin:  Dry, warm to touch, negative for any Rashes,  Wounds: Please see nursing documentation         Data Reviewed: I have personally reviewed following labs and imaging studies  CBC: Recent Labs  Lab 03/06/22 0428 03/07/22 0757 03/08/22 0716 03/11/22 0403  WBC 6.9 6.7 5.4 9.5  NEUTROABS 4.7 4.7 3.5  --   HGB 14.2 14.9 15.1 13.3  HCT 41.2 42.3 42.9 39.2  MCV 92.0 89.8 89.4 92.9  PLT 137* 166 181 192   Basic Metabolic Panel: Recent Labs  Lab 03/06/22 0428 03/07/22 0757 03/07/22 0759 03/08/22 0716 03/08/22 0717 03/09/22 0501 03/11/22 0403 03/12/22 0659  NA 134* 131* 131* 135 136 136 136 135  K 2.9* 2.7* 2.8* 3.0* 3.0* 3.5 3.3* 3.1*  CL 92* 90* 90* 95* 95* 100 102 100  CO2 30 27 27 25 27 26 27  30   GLUCOSE 109* 105* 105* 123* 124* 137* 162* 130*  BUN 24* 25* 26* 26* 27* 30* 31* 33*  CREATININE 1.04 0.90 0.93 1.01 1.02 0.95 0.90 0.83  CALCIUM 9.4 9.2 9.2 9.5 9.5 9.2 9.5 9.3  MG 1.8 1.6*  --  2.2  --   --   --   --   PHOS 3.2 2.5 2.5 3.3 3.4  --  1.7*  --    GFR: Estimated Creatinine Clearance: 57.5 mL/min (by C-G formula based on SCr of 0.83 mg/dL). Liver Function Tests: Recent Labs  Lab 03/06/22 0428 03/07/22 0757 03/07/22 0759 03/08/22 0716 03/08/22 0717 03/09/22 0501 03/11/22 0403  AST 28 43*  --  79*  --  71*  --   ALT 18 22  --  32  --  39  --   ALKPHOS 50 55  --  55  --  49  --   BILITOT 1.1 1.2  --  1.6*  --  1.3*  --   PROT 6.0* 6.3*  --  6.3*  --  5.9*  --   ALBUMIN 3.2* 3.5 3.6 3.6 3.6 3.3* 3.2*   HbA1C: Guy results for input(s): "HGBA1C" in the last 72 hours.  Recent Results (from the past 240 hour(s))  Blood Culture (routine x 2)     Status: None   Collection Time: 03/04/22 12:44 PM   Specimen: BLOOD  Result Value Ref Range Status   Specimen Description BLOOD RIGHT WRIST  Final   Special Requests   Final    BOTTLES DRAWN AEROBIC AND ANAEROBIC Blood Culture adequate volume   Culture   Final    Guy GROWTH 5 DAYS Performed at [facility], [address], [place], [place]    Report Status 03/09/2022 FINAL  Final  SARS Coronavirus 2 by RT PCR (hospital order, performed in [facility] hospital lab) *cepheid single result test* Anterior Nasal Swab     Status: Abnormal   Collection Time: 03/04/22 12:58 PM   Specimen: Anterior Nasal Swab  Result Value Ref Range Status   SARS Coronavirus 2 by RT PCR POSITIVE (A) NEGATIVE Final    Comment: (NOTE) SARS-CoV-2 target nucleic acids are DETECTED  SARS-CoV-2 RNA is generally detectable in upper respiratory specimens  during the acute phase of infection.  Positive results  are indicative  of the presence of the identified virus, but do not rule out bacterial infection or co-infection with other  pathogens not detected by the test.  Clinical correlation with patient history and  other diagnostic information is necessary to determine patient infection status.  The expected result is negative.  Fact Sheet for Patients:   RoadLapTop.co.za   Fact Sheet for Healthcare Providers:   http://kim-miller.com/    This test is not yet approved or cleared by the [place] FDA and  has been authorized for detection and/or diagnosis of SARS-CoV-2 by FDA under an Emergency Use Authorization (EUA).  This EUA will remain in effect (meaning this test can be used) for the duration of  the COVID-19 declaration under Section 564(b)(1)  of the Act, 21 U.S.C. section 360-bbb-3(b)(1), unless the authorization is terminated or revoked sooner.   Performed at [facility], [address] [place], [place] [address]   Blood Culture (routine x 2)     Status: None   Collection Time: 03/04/22  1:44 PM   Specimen: BLOOD  Result Value Ref Range Status   Specimen Description BLOOD LEFT WRIST  Final   Special Requests   Final    BOTTLES DRAWN AEROBIC AND ANAEROBIC Blood Culture results may not be optimal due to an excessive volume of blood received in culture bottles   Culture   Final    Guy GROWTH 5 DAYS Performed at [facility], [address] [place], [place] [address]    Report Status 03/09/2022 FINAL  Final  Urine Culture     Status: Abnormal   Collection Time: 03/05/22 11:29 AM   Specimen: Urine, Clean Catch  Result Value Ref Range Status   Specimen Description   Final    URINE, CLEAN CATCH Performed at [facility], [address], [place], [place] [address]    Special Requests   Final    NONE Performed at [facility], [address], [place], [place] [address]    Culture MULTIPLE SPECIES PRESENT, SUGGEST RECOLLECTION (A)  Final   Report Status 03/06/2022 FINAL  Final    Radiology Studies: Guy results found. Scheduled Meds:  aspirin EC  81 mg  Oral q morning   enoxaparin (LOVENOX) injection  40 mg Subcutaneous Q24H   feeding supplement  237 mL Oral BID BM   furosemide  20 mg Oral Daily   insulin aspart  0-5 Units Subcutaneous QHS   insulin aspart  0-9 Units Subcutaneous TID WC   lisinopril  20 mg Oral BID   magic mouthwash w/lidocaine  5 mL Oral QID   metoprolol tartrate  50 mg Oral BID   pantoprazole  40 mg Oral BID AC   tamsulosin  0.4 mg Oral QPC supper   traZODone  50 mg Oral QHS   Continuous Infusions:   LOS: 7 days   Kendell Bane M.D on 03/12/2022 at 12:55 PM  Go to [email or web address] - for contact info  [facility] - Office  [phone]  If 7PM-7AM, please contact night-coverage [email or web address] 03/12/2022, 12:55 PM

## 2022-03-12 NOTE — TOC Progression Note (Signed)
Transition of Care [facility]) - Progression Note    Patient Details  Name: Guy Roberson MRN: [id number] Date of Birth: 1931-12-01  Transition of [facility]) CM/SW Contact  Shade Flood, LCSW Phone Number: 03/12/2022, 1:23 PM  Clinical Narrative:     TOC following. SNF bed search and insurance auth started today in anticipation of dc Saturday (day 11 post covid diangosis) once bed found and auth approved.   Will follow.  Expected Discharge Plan: [place] Barriers to Discharge: Continued Medical Work up  Expected Discharge Plan and Services Expected Discharge Plan: [place] In-house Referral: Clinical Social Work   Post Acute Care Choice: Resumption of Svcs/PTA Provider Living arrangements for the past 2 months: Single Family Home                                       Social Determinants of Health (SDOH) Interventions    Readmission Risk Interventions    03/06/2022    2:29 PM  Readmission Risk Prevention Plan  Transportation Screening Complete  Home Care Screening Complete  Medication Review (RN CM) Complete

## 2022-03-13 DIAGNOSIS — R531 Weakness: Secondary | ICD-10-CM | POA: Diagnosis not present

## 2022-03-13 LAB — BASIC METABOLIC PANEL
Anion gap: 9 (ref 5–15)
BUN: 25 mg/dL — ABNORMAL HIGH (ref 8–23)
CO2: 28 mmol/L (ref 22–32)
Calcium: 9.7 mg/dL (ref 8.9–10.3)
Chloride: 99 mmol/L (ref 98–111)
Creatinine, Ser: 0.79 mg/dL (ref 0.61–1.24)
GFR, Estimated: >60 mL/min (ref >60)
Glucose, Bld: 137 mg/dL — ABNORMAL HIGH (ref 70–99)
Potassium: 3.3 mmol/L — ABNORMAL LOW (ref 3.5–5.1)
Sodium: 136 mmol/L (ref 135–145)

## 2022-03-13 LAB — GLUCOSE, CAPILLARY
Glucose-Capillary: 141 mg/dL — ABNORMAL HIGH (ref 70–99)
Glucose-Capillary: 145 mg/dL — ABNORMAL HIGH (ref 70–99)
Glucose-Capillary: 106 mg/dL — ABNORMAL HIGH (ref 70–99)
Glucose-Capillary: 197 mg/dL — ABNORMAL HIGH (ref 70–99)

## 2022-03-13 MED ORDER — QUETIAPINE FUMARATE 25 MG PO TABS
25.0000 mg | ORAL_TABLET | Freq: Two times a day (BID) | ORAL | Status: DC
  Administered 2022-03-13 (×2): 25 mg via ORAL
  Filled 2022-03-13 (×3): qty 1

## 2022-03-13 NOTE — Progress Notes (Signed)
Physical Therapy Treatment Patient Details Name: Guy Roberson MRN: [id number] DOB: [birth year] Today's Date: 03/13/2022   History of Present Illness Guy Roberson is a 88 y.o. male with medical history significant for DM, HTN, complete heart block with pacemaker, urinary artery disease, BPH.  Patient was to the ED via EMS reports of generalized weakness, inability to ambulate.  Symptoms started about 2 days ago with cough.  This morning he was so weak he was unable to stand.  At baseline he uses a walker, he got out of bed this morning, normally his legs do not reach the floor when he is sitting on his bed, and on attempting to stand he slid off the bed to the floor.  He does not know if he hit his head.  Fall was unwitnessed.  He denies difficulty breathing.  He has chronic straining and dribbling otherwise no dysuria or frequency.  He had an episode of fecal incontinence yesterday.  No vomiting.  He got at least 2 shots maybe 3 of the COVID-vaccine.  He has not had COVID infection before.  He has had lower extremity swelling over the past several weeks to months.    PT Comments    Patient presents confused and requires Max verbal/tactile cueing to participate with therapy.  Patient has extreme extensor tone in back and legs and has difficulty sitting up at bedside due to frequent falling backwards and poor carryover for flexing knees, required HOB raised to put in sitting position, but continues to fall backwards and unable to attempt sit to stands or transferring to chair.  Patient required Mod assist and fair/good return for pulling self to [facility] using BUE when repositioning in bed.  Patient will benefit from continued skilled physical therapy in hospital and recommended venue below to increase strength, balance, endurance for safe ADLs and gait.    Recommendations for follow up therapy are one component of a multi-disciplinary discharge planning process, led by the attending physician.   Recommendations may be updated based on patient status, additional functional criteria and insurance authorization.  Follow Up Recommendations  Skilled nursing-short term rehab (<3 hours/day) Can patient physically be transported by private vehicle: No   Assistance Recommended at Discharge Frequent or constant Supervision/Assistance  Patient can return home with the following A lot of help with bathing/dressing/bathroom;A lot of help with walking and/or transfers;Help with stairs or ramp for entrance;Assistance with cooking/housework   Equipment Recommendations  None recommended by PT    Recommendations for Other Services       Precautions / Restrictions Precautions Precautions: Fall Restrictions Weight Bearing Restrictions: No     Mobility  Bed Mobility Overal bed mobility: Needs Assistance Bed Mobility: Supine to Sit, Sit to Supine     Supine to sit: Mod assist, Max assist Sit to supine: Mod assist, Max assist   General bed mobility comments: very rigid posture with diffiuclty flexing knees, hips and sitting up right    Transfers                        Ambulation/Gait                   Stairs             Wheelchair Mobility    Modified Rankin (Stroke Patients Only)       Balance Overall balance assessment: Needs assistance Sitting-balance support: Feet supported, Bilateral upper extremity supported Sitting balance-Leahy Scale: Poor Sitting balance -  Comments: seated at EOB Postural control: Posterior lean                                  Cognition Arousal/Alertness: Awake/alert Behavior During Therapy: Restless, Impulsive Overall Cognitive Status: No family/caregiver present to determine baseline cognitive functioning                                 General Comments: Patient requires repeated verbal/tactile cueing to participate        Exercises Other Exercises Other Exercises: active assisted  ROM to BLE focusing on knee/hip flexion x 10 reps each in supine position    General Comments        Pertinent Vitals/Pain Pain Assessment Pain Assessment: No/denies pain    Home Living                          Prior Function            PT Goals (current goals can now be found in the care plan section) Acute Rehab PT Goals Patient Stated Goal: return home PT Goal Formulation: With patient Time For Goal Achievement: 03/20/22 Potential to Achieve Goals: Fair Progress towards PT goals: Progressing toward goals    Frequency    Min 3X/week      PT Plan Current plan remains appropriate    Co-evaluation              AM-PAC PT "6 Clicks" Mobility   Outcome Measure  Help needed turning from your back to your side while in a flat bed without using bedrails?: A Lot Help needed moving from lying on your back to sitting on the side of a flat bed without using bedrails?: A Lot Help needed moving to and from a bed to a chair (including a wheelchair)?: Total Help needed standing up from a chair using your arms (e.g., wheelchair or bedside chair)?: Total Help needed to walk in hospital room?: Total Help needed climbing 3-5 steps with a railing? : Total 6 Click Score: 8    End of Session   Activity Tolerance: Patient tolerated treatment well;Patient limited by fatigue Patient left: in bed;with call bell/phone within reach;with bed alarm set Nurse Communication: Mobility status PT Visit Diagnosis: Unsteadiness on feet (R26.81);Other abnormalities of gait and mobility (R26.89);Muscle weakness (generalized) (M62.81);History of falling (Z91.81)     Time: 1194-1740 PT Time Calculation (min) (ACUTE ONLY): 30 min  Charges:  $Therapeutic Activity: 23-37 mins                     3:45 PM, 03/13/22 Lonell Grandchild, MPT Physical Therapist with [facility] [phone] office [phone] mobile phone

## 2022-03-13 NOTE — Progress Notes (Signed)
PROGRESS NOTE     Guy Roberson, is a 88 y.o. male, DOB - Apr 07, 1932, GEX:[id number]  Admit date - 03/04/2022   Admitting Physician Courage Denton Brick, MD  Outpatient Primary MD for the patient is [name], MD  LOS - 8  Chief Complaint  Patient presents with   Weakness      Subjective: Guy Roberson ,   On 03/13/2022 patient was awake comfortable in bed, alert oriented x2, mildly agitated fidgety in bed Mitten was reintroduced briefly  -Not requiring sitter -- Continue fall precautions -  Brief Narrative:  88 y.o. male with medical history significant for DM, HTN, complete heart block with pacemaker,  BPH admitted on 03/04/2022 with urinary incontinence and dyspnea in the setting of COVID-19 infection -Unable to go to SNF facility until after 10 days isolation period From COVID  Assessment and plan:   1) Generalized weakness/status post fall---Multifactorial etiology suspected in a patient with COVID-19 infection and CHF  -Remained stable -CT head without acute findings --Pt Was hoping to go home however unable to walk safely with physical therapy and did very poorly with ambulation with family at bedside.... Very high fall risk -Patient reluctantly agrees to go to SNF rehab  -Physical therapy eval appreciated recommends SNF rehab -SNF facility requires patient to be at least 10 days out from [place] diagnosis prior to be admitted to SNF (dxed 03/04/22) -Recurrent falls---PTA pt lived alone and did very poorly, patient has significant limitations with mobility related ADLs- this patient needs to continue to be monitored in the hospital until a SNF bed is obtained as she is not safe to go home with her current physcical limitations -- Patient continues to have recurrent falls even in the  hospital  03/13/2022 Awake alert, following command, comfortable in bed, fidgety, mildly agitated, mitten has been reduced briefly -Not requiring sitter -- Continue fall precautions  -  2)COVID-19 virus infection -Previously vaccinated -No respiratory symptoms -Patient with significant weakness and malaise -Completed Paxlovid -Continue bronchodilators as needed -No Significant hypoxia and chest x-ray is not suggestive of COVID-pneumonia--- steroids was not utilized  Off isolation 03/14/2022  3)HFpEF--Acute on chronic diastolic congestive heart failure (HCC) -Echo with preserved EF of 50 to 55% with severe asymmetric LVH and mild pulmonary hypertension -No aortic stenosis -Lower extremity edema and dyspnea improving with diuresis-Dopplers without DVT -Admission chest x-ray was consistent with CHF  -Daily weights and fluid input and output monitoring -Continue lisinopril and metoprolol --Diuresed with Lasix initially -Oral intake is poor may need gentle IV fluid hydration -Avoid additional aggressive diuresis until oral intake improves  4)Hypokalemia/Hypophosphatemia/hypomagnesemia -In the setting of diuretic use -Replace and recheck  5)Hypertension Stable. Continue metoprolol , lisinopril  -HCTZ discontinued   6)DM (diabetes mellitus) ([place]) .  A1c 6.5 reflecting good diabetic control PTA -Use Novolog/Humalog Sliding scale insulin with Accu-Cheks/Fingersticks as ordered   7)Urinary retention--- c/n  Flomax    8)Possible UTI----UA was somewhat suggestive of UTI -However urine culture with contaminated/mixed growth -Treated with IV Rocephin from 03/05/2022 through 03/07/2022 inclusive for 3 doses -No further antibiotics indicated  9)Thrombocytopenia---??  Related to infection -Platelets have normalized  10)Hospital-acquired delirium with increased confusion and falls--- - CT head post fall without acute abnormalities -Hospital delirium with confusion, disorientation and agitation is improving overall -Remove soft wrist restraints -- Continue fall precautions  Trazodone nightly -Lorazepam prn as ordered -Haldol was ineffective -As needed mitten  placement  Disposition/Need for in-Hospital Stay- patient unable to be discharged at this time due to - -  Generalized weakness and falls at home--- awaiting transfer to SNF rehab --SNF facility requires patient to be at least 10 days out from COVID diagnosis prior to be admitted to SNF (dxed 03/04/22)>>> will be off isolation 03/14/2022 ---  Patient continues to have recurrent falls even in the  hospital  Status is: Inpatient   Disposition: The patient is from: Home              Anticipated d/c is to: SNF              Anticipated d/c date is: 03/14/2022              Patient currently is not medically stable to d/c. Barriers: Not Clinically Stable-   Code Status :  -  Code Status: Partial Code   Family Communication: Previously discussed with daughter in law [name]----   DVT Prophylaxis  :   - SCDs   enoxaparin (LOVENOX) injection 40 mg Start: 03/04/22 2045  Lab Results  Component Value Date   PLT 192 03/11/2022   Inpatient Medications  Scheduled Meds:  aspirin EC  81 mg Oral q morning   enoxaparin (LOVENOX) injection  40 mg Subcutaneous Q24H   feeding supplement  237 mL Oral BID BM   furosemide  20 mg Oral Daily   insulin aspart  0-5 Units Subcutaneous QHS   insulin aspart  0-9 Units Subcutaneous TID WC   lisinopril  20 mg Oral BID   magic mouthwash w/lidocaine  5 mL Oral QID   metoprolol tartrate  50 mg Oral BID   pantoprazole  40 mg Oral BID AC   tamsulosin  0.4 mg Oral QPC supper   traZODone  50 mg Oral QHS   Continuous Infusions:   PRN Meds:.acetaminophen **OR** acetaminophen, guaiFENesin-dextromethorphan, hydrALAZINE, LORazepam, polyethylene glycol, promethazine   Anti-infectives (From admission, onward)    Start     Dose/Rate Route Frequency Ordered Stop   03/05/22 1800  cefTRIAXone (ROCEPHIN) 1 g in sodium chloride 0.9 % 100 mL IVPB        1 g 200 mL/hr over 30 Minutes Intravenous Every 24 hours 03/05/22 1711 03/08/22 0854   03/05/22 1000   nirmatrelvir/ritonavir EUA (renal dosing) (PAXLOVID) 2 tablet        2 tablet Oral 2 times daily 03/05/22 0735 03/09/22 2143   03/04/22 2200  nirmatrelvir/ritonavir EUA (PAXLOVID) 3 tablet  Status:  Discontinued        3 tablet Oral 2 times daily 03/04/22 1927 03/05/22 0735         Objective: Vitals:   03/13/22 0442 03/13/22 0445 03/13/22 0500 03/13/22 1228  BP: (!) 146/95   (!) 151/69  Pulse: 86   66  Resp: 18   18  Temp: 97.8 F (36.6 C)   97.7 F (36.5 C)  TempSrc:    Oral  SpO2: 95%   97%  Weight:  62.7 kg 75.9 kg   Height:        Intake/Output Summary (Last 24 hours) at 03/13/2022 1325 Last data filed at 03/13/2022 1200 Gross per 24 hour  Intake 520 ml  Output 1000 ml  Net -480 ml   Filed Weights   03/12/22 0532 03/13/22 0445 03/13/22 0500  Weight: 66.3 kg 62.7 kg 75.9 kg      Physical Exam:   General:  AAO x 2,  cooperative, no distress; mildly agitated  HEENT:  Normocephalic, PERRL, otherwise with in Normal limits   Neuro:  CNII-XII intact. , normal  motor and sensation, reflexes intact   Lungs:   Clear to auscultation BL, Respirations unlabored,  No wheezes / crackles  Cardio:    S1/S2, RRR, No murmure, No Rubs or Gallops   Abdomen:  Soft, non-tender, bowel sounds active all four quadrants, no guarding or peritoneal signs.  Muscular  skeletal:  Limited exam -global generalized weaknesses - in bed, able to move all 4 extremities,   2+ pulses,  symmetric, No pitting edema  Skin:  Dry, warm to touch, negative for any Rashes,  Wounds: Please see nursing documentation            Data Reviewed: I have personally reviewed following labs and imaging studies  CBC: Recent Labs  Lab 03/07/22 0757 03/08/22 0716 03/11/22 0403  WBC 6.7 5.4 9.5  NEUTROABS 4.7 3.5  --   HGB 14.9 15.1 13.3  HCT 42.3 42.9 39.2  MCV 89.8 89.4 92.9  PLT 166 181 192   Basic Metabolic Panel: Recent Labs  Lab 03/07/22 0757 03/07/22 0759 03/08/22 0716 03/08/22 0717  03/09/22 0501 03/11/22 0403 03/12/22 0659 03/13/22 0757  NA 131* 131* 135 136 136 136 135 136  K 2.7* 2.8* 3.0* 3.0* 3.5 3.3* 3.1* 3.3*  CL 90* 90* 95* 95* 100 102 100 99  CO2 27 27 25 27 26 27 30 28   GLUCOSE 105* 105* 123* 124* 137* 162* 130* 137*  BUN 25* 26* 26* 27* 30* 31* 33* 25*  CREATININE 0.90 0.93 1.01 1.02 0.95 0.90 0.83 0.79  CALCIUM 9.2 9.2 9.5 9.5 9.2 9.5 9.3 9.7  MG 1.6*  --  2.2  --   --   --   --   --   PHOS 2.5 2.5 3.3 3.4  --  1.7*  --   --    GFR: Estimated Creatinine Clearance: 59.7 mL/min (by C-G formula based on SCr of 0.79 mg/dL). Liver Function Tests: Recent Labs  Lab 03/07/22 0757 03/07/22 0759 03/08/22 0716 03/08/22 0717 03/09/22 0501 03/11/22 0403  AST 43*  --  79*  --  71*  --   ALT 22  --  32  --  39  --   ALKPHOS 55  --  55  --  49  --   BILITOT 1.2  --  1.6*  --  1.3*  --   PROT 6.3*  --  6.3*  --  5.9*  --   ALBUMIN 3.5 3.6 3.6 3.6 3.3* 3.2*   HbA1C: No results for input(s): "HGBA1C" in the last 72 hours.  Recent Results (from the past 240 hour(s))  Blood Culture (routine x 2)     Status: None   Collection Time: 03/04/22 12:44 PM   Specimen: BLOOD  Result Value Ref Range Status   Specimen Description BLOOD RIGHT WRIST  Final   Special Requests   Final    BOTTLES DRAWN AEROBIC AND ANAEROBIC Blood Culture adequate volume   Culture   Final    NO GROWTH 5 DAYS Performed at [facility], [address] [place], [place]    Report Status 03/09/2022 FINAL  Final  SARS Coronavirus 2 by RT PCR (hospital order, performed in [facility] hospital lab) *cepheid single result test* Anterior Nasal Swab     Status: Abnormal   Collection Time: 03/04/22 12:58 PM   Specimen: Anterior Nasal Swab  Result Value Ref Range Status   SARS Coronavirus 2 by RT PCR POSITIVE (A) NEGATIVE Final    Comment: (NOTE) SARS-CoV-2 target nucleic acids are  DETECTED  SARS-CoV-2 RNA is generally detectable in upper respiratory specimens  during the acute  phase of infection.  Positive results are indicative  of the presence of the identified virus, but do not rule out bacterial infection or co-infection with other pathogens not detected by the test.  Clinical correlation with patient history and  other diagnostic information is necessary to determine patient infection status.  The expected result is negative.  Fact Sheet for Patients:   RoadLapTop.co.za   Fact Sheet for Healthcare Providers:   http://kim-miller.com/    This test is not yet approved or cleared by the [place] FDA and  has been authorized for detection and/or diagnosis of SARS-CoV-2 by FDA under an Emergency Use Authorization (EUA).  This EUA will remain in effect (meaning this test can be used) for the duration of  the COVID-19 declaration under Section 564(b)(1)  of the Act, 21 U.S.C. section 360-bbb-3(b)(1), unless the authorization is terminated or revoked sooner.   Performed at [facility], [address], [place], [place] [address]   Blood Culture (routine x 2)     Status: None   Collection Time: 03/04/22  1:44 PM   Specimen: BLOOD  Result Value Ref Range Status   Specimen Description BLOOD LEFT WRIST  Final   Special Requests   Final    BOTTLES DRAWN AEROBIC AND ANAEROBIC Blood Culture results may not be optimal due to an excessive volume of blood received in culture bottles   Culture   Final    NO GROWTH 5 DAYS Performed at [facility], [address] [place], [place] [address]    Report Status 03/09/2022 FINAL  Final  Urine Culture     Status: Abnormal   Collection Time: 03/05/22 11:29 AM   Specimen: Urine, Clean Catch  Result Value Ref Range Status   Specimen Description   Final    URINE, CLEAN CATCH Performed at [facility], [address] [place], [place] [address]    Special Requests   Final    NONE Performed at [facility], [address] [place], [place] [address]    Culture MULTIPLE  SPECIES PRESENT, SUGGEST RECOLLECTION (A)  Final   Report Status 03/06/2022 FINAL  Final    Radiology Studies: No results found. Scheduled Meds:  aspirin EC  81 mg Oral q morning   enoxaparin (LOVENOX) injection  40 mg Subcutaneous Q24H   feeding supplement  237 mL Oral BID BM   furosemide  20 mg Oral Daily   insulin aspart  0-5 Units Subcutaneous QHS   insulin aspart  0-9 Units Subcutaneous TID WC   lisinopril  20 mg Oral BID   magic mouthwash w/lidocaine  5 mL Oral QID   metoprolol tartrate  50 mg Oral BID   pantoprazole  40 mg Oral BID AC   tamsulosin  0.4 mg Oral QPC supper   traZODone  50 mg Oral QHS   Continuous Infusions:   LOS: 8 days   Kendell Bane M.D on 03/13/2022 at 1:25 PM  Go to [email or web address] - for contact info  [facility] - Office  [phone]  If 7PM-7AM, please contact night-coverage [email or web address] 03/13/2022, 1:25 PM

## 2022-03-13 NOTE — TOC Progression Note (Signed)
Transition of Care [facility]) - Progression Note    Patient Details  Name: Guy Roberson MRN: [id number] Date of Birth: 1931/09/18  Transition of Care [facility]) CM/SW Contact  Shade Flood, LCSW Phone Number: 03/13/2022, 2:45 PM  Clinical Narrative:     TOC following. Spoke with family about SNF bed offers. They have selected [facility]. Pt must be 48 hours without mitts/other restraints including PRN Ativan.  Updated RN and MD. Will follow up Monday.  Expected Discharge Plan: [place] Barriers to Discharge: Continued Medical Work up  Expected Discharge Plan and Services Expected Discharge Plan: [place] In-house Referral: Clinical Social Work   Post Acute Care Choice: Resumption of Svcs/PTA Provider Living arrangements for the past 2 months: Single Family Home                                       Social Determinants of Health (SDOH) Interventions    Readmission Risk Interventions    03/06/2022    2:29 PM  Readmission Risk Prevention Plan  Transportation Screening Complete  Home Care Screening Complete  Medication Review (RN CM) Complete

## 2022-03-13 NOTE — Plan of Care (Signed)
Problem: Education: Goal: Knowledge of General Education information will improve Description: Including pain rating scale, medication(s)/side effects and non-pharmacologic comfort measures 03/13/2022 0243 by Precious Bard, RN Outcome: Progressing 03/12/2022 2351 by Precious Bard, RN Outcome: Progressing   Problem: Health Behavior/Discharge Planning: Goal: Ability to manage health-related needs will improve 03/13/2022 0243 by Precious Bard, RN Outcome: Progressing 03/12/2022 2351 by Precious Bard, RN Outcome: Progressing   Problem: Clinical Measurements: Goal: Ability to maintain clinical measurements within normal limits will improve 03/13/2022 0243 by Precious Bard, RN Outcome: Progressing 03/12/2022 2351 by Precious Bard, RN Outcome: Progressing Goal: Will remain free from infection 03/13/2022 0243 by Precious Bard, RN Outcome: Progressing 03/12/2022 2351 by Precious Bard, RN Outcome: Progressing Goal: Diagnostic test results will improve 03/13/2022 0243 by Precious Bard, RN Outcome: Progressing 03/12/2022 2351 by Precious Bard, RN Outcome: Progressing Goal: Respiratory complications will improve 03/13/2022 0243 by Precious Bard, RN Outcome: Progressing 03/12/2022 2351 by Precious Bard, RN Outcome: Progressing Goal: Cardiovascular complication will be avoided 03/13/2022 0243 by Precious Bard, RN Outcome: Progressing 03/12/2022 2351 by Precious Bard, RN Outcome: Progressing   Problem: Activity: Goal: Risk for activity intolerance will decrease 03/13/2022 0243 by Precious Bard, RN Outcome: Progressing 03/12/2022 2351 by Precious Bard, RN Outcome: Progressing   Problem: Nutrition: Goal: Adequate nutrition will be maintained 03/13/2022 0243 by Precious Bard,  RN Outcome: Progressing 03/12/2022 2351 by Precious Bard, RN Outcome: Progressing   Problem: Coping: Goal: Level of anxiety will decrease 03/13/2022 0243 by Precious Bard, RN Outcome: Progressing 03/12/2022 2351 by Precious Bard, RN Outcome: Progressing   Problem: Elimination: Goal: Will not experience complications related to bowel motility 03/13/2022 0243 by Precious Bard, RN Outcome: Progressing 03/12/2022 2351 by Precious Bard, RN Outcome: Progressing Goal: Will not experience complications related to urinary retention 03/13/2022 0243 by Precious Bard, RN Outcome: Progressing 03/12/2022 2351 by Precious Bard, RN Outcome: Progressing   Problem: Pain Managment: Goal: General experience of comfort will improve 03/13/2022 0243 by Precious Bard, RN Outcome: Progressing 03/12/2022 2351 by Precious Bard, RN Outcome: Progressing   Problem: Safety: Goal: Ability to remain free from injury will improve 03/13/2022 0243 by Precious Bard, RN Outcome: Progressing 03/12/2022 2351 by Precious Bard, RN Outcome: Progressing   Problem: Skin Integrity: Goal: Risk for impaired skin integrity will decrease 03/13/2022 0243 by Precious Bard, RN Outcome: Progressing 03/12/2022 2351 by Precious Bard, RN Outcome: Progressing   Problem: Education: Goal: Ability to demonstrate management of disease process will improve 03/13/2022 0243 by Precious Bard, RN Outcome: Progressing 03/12/2022 2351 by Precious Bard, RN Outcome: Progressing Goal: Ability to verbalize understanding of medication therapies will improve 03/13/2022 0243 by Precious Bard, RN Outcome: Progressing 03/12/2022 2351 by Precious Bard, RN Outcome: Progressing Goal: Individualized Educational Video(s) 03/13/2022 0243 by  Precious Bard, RN Outcome: Progressing 03/12/2022 2351 by Precious Bard, RN Outcome: Progressing   Problem: Activity: Goal: Capacity to carry out activities will improve 03/13/2022 0243 by Precious Bard, RN Outcome: Progressing 03/12/2022 2351 by Precious Bard, RN Outcome: Progressing   Problem: Cardiac: Goal: Ability to achieve and maintain adequate cardiopulmonary perfusion  will improve 03/13/2022 0243 by Precious Bard, RN Outcome: Progressing 03/12/2022 2351 by Precious Bard, RN Outcome: Progressing   Problem: Education: Goal: Knowledge of risk factors and measures for prevention of condition will improve 03/13/2022 0243 by Precious Bard, RN Outcome: Progressing 03/12/2022 2351 by Precious Bard, RN Outcome: Progressing   Problem: Coping: Goal: Psychosocial and spiritual needs will be supported 03/13/2022 0243 by Precious Bard, RN Outcome: Progressing 03/12/2022 2351 by Precious Bard, RN Outcome: Progressing   Problem: Respiratory: Goal: Will maintain a patent airway 03/13/2022 0243 by Precious Bard, RN Outcome: Progressing 03/12/2022 2351 by Precious Bard, RN Outcome: Progressing Goal: Complications related to the disease process, condition or treatment will be avoided or minimized 03/13/2022 0243 by Precious Bard, RN Outcome: Progressing 03/12/2022 2351 by Precious Bard, RN Outcome: Progressing   Problem: Education: Goal: Ability to describe self-care measures that may prevent or decrease complications (Diabetes Survival Skills Education) will improve 03/13/2022 0243 by Precious Bard, RN Outcome: Progressing 03/12/2022 2351 by Precious Bard, RN Outcome: Progressing Goal: Individualized Educational Video(s) 03/13/2022 0243 by Precious Bard, RN Outcome:  Progressing 03/12/2022 2351 by Precious Bard, RN Outcome: Progressing   Problem: Coping: Goal: Ability to adjust to condition or change in health will improve 03/13/2022 0243 by Precious Bard, RN Outcome: Progressing 03/12/2022 2351 by Precious Bard, RN Outcome: Progressing   Problem: Fluid Volume: Goal: Ability to maintain a balanced intake and output will improve 03/13/2022 0243 by Precious Bard, RN Outcome: Progressing 03/12/2022 2351 by Precious Bard, RN Outcome: Progressing   Problem: Health Behavior/Discharge Planning: Goal: Ability to identify and utilize available resources and services will improve 03/13/2022 0243 by Precious Bard, RN Outcome: Progressing 03/12/2022 2351 by Precious Bard, RN Outcome: Progressing Goal: Ability to manage health-related needs will improve 03/13/2022 0243 by Precious Bard, RN Outcome: Progressing 03/12/2022 2351 by Precious Bard, RN Outcome: Progressing   Problem: Metabolic: Goal: Ability to maintain appropriate glucose levels will improve 03/13/2022 0243 by Precious Bard, RN Outcome: Progressing 03/12/2022 2351 by Precious Bard, RN Outcome: Progressing   Problem: Nutritional: Goal: Maintenance of adequate nutrition will improve 03/13/2022 0243 by Precious Bard, RN Outcome: Progressing 03/12/2022 2351 by Precious Bard, RN Outcome: Progressing Goal: Progress toward achieving an optimal weight will improve 03/13/2022 0243 by Precious Bard, RN Outcome: Progressing 03/12/2022 2351 by Precious Bard, RN Outcome: Progressing   Problem: Skin Integrity: Goal: Risk for impaired skin integrity will decrease 03/13/2022 0243 by Precious Bard, RN Outcome: Progressing 03/12/2022 2351 by Precious Bard, RN Outcome: Progressing   Problem:  Tissue Perfusion: Goal: Adequacy of tissue perfusion will improve 03/13/2022 0243 by Precious Bard, RN Outcome: Progressing 03/12/2022 2351 by Precious Bard, RN Outcome: Progressing   Problem: Safety: Goal: Non-violent Restraint(s) 03/13/2022 0243 by Precious Bard, RN Outcome: Progressing 03/12/2022 2351 by Precious Bard, RN Outcome: Progressing   Problem: Education: Goal: Knowledge of disease or condition will improve Outcome: Progressing Goal: Knowledge of secondary prevention will improve (SELECT ALL) Outcome: Progressing Goal: Knowledge of patient specific risk factors will improve (INDIVIDUALIZE FOR PATIENT) Outcome: Progressing   Problem: Coping: Goal: Will verbalize positive feelings about self Outcome: Progressing Goal:  Will identify appropriate support needs Outcome: Progressing   Problem: Self-Care: Goal: Ability to communicate needs accurately will improve Outcome: Progressing   Problem: Nutrition: Goal: Risk of aspiration will decrease Outcome: Progressing   Problem: Ischemic Stroke/TIA Tissue Perfusion: Goal: Complications of ischemic stroke/TIA will be minimized Outcome: Progressing

## 2022-03-13 NOTE — Progress Notes (Signed)
Can state name and birthday and knows he is not at home but other than that he confused.  Stated that he went to see someone yesterday and son says that he said he saw a bird's nest that wasn't there.  No meds given on this shift to alert mental status.  Had ativan last night around 2200.  Son said that he was much clearer yesterday.  Mitts applied due to trying to pull off catheter.

## 2022-03-14 ENCOUNTER — Inpatient Hospital Stay (HOSPITAL_COMMUNITY): Payer: Medicare PPO

## 2022-03-14 DIAGNOSIS — R531 Weakness: Secondary | ICD-10-CM | POA: Diagnosis not present

## 2022-03-14 LAB — GLUCOSE, CAPILLARY
Glucose-Capillary: 139 mg/dL — ABNORMAL HIGH (ref 70–99)
Glucose-Capillary: 140 mg/dL — ABNORMAL HIGH (ref 70–99)
Glucose-Capillary: 114 mg/dL — ABNORMAL HIGH (ref 70–99)
Glucose-Capillary: 113 mg/dL — ABNORMAL HIGH (ref 70–99)

## 2022-03-14 LAB — BASIC METABOLIC PANEL
Anion gap: 10 (ref 5–15)
BUN: 24 mg/dL — ABNORMAL HIGH (ref 8–23)
CO2: 26 mmol/L (ref 22–32)
Calcium: 9.8 mg/dL (ref 8.9–10.3)
Chloride: 100 mmol/L (ref 98–111)
Creatinine, Ser: 0.9 mg/dL (ref 0.61–1.24)
GFR, Estimated: >60 mL/min (ref >60)
Glucose, Bld: 123 mg/dL — ABNORMAL HIGH (ref 70–99)
Potassium: 3.2 mmol/L — ABNORMAL LOW (ref 3.5–5.1)
Sodium: 136 mmol/L (ref 135–145)

## 2022-03-14 MED ORDER — QUETIAPINE FUMARATE 25 MG PO TABS
50.0000 mg | ORAL_TABLET | Freq: Every day | ORAL | Status: DC
  Administered 2022-03-15: 50 mg via ORAL
  Filled 2022-03-14 (×2): qty 2

## 2022-03-14 MED ORDER — AMLODIPINE BESYLATE 5 MG PO TABS
10.0000 mg | ORAL_TABLET | Freq: Every day | ORAL | Status: DC
  Administered 2022-03-14 – 2022-03-16 (×3): 10 mg via ORAL
  Filled 2022-03-14 (×3): qty 2

## 2022-03-14 MED ORDER — LORAZEPAM 2 MG/ML IJ SOLN
1.0000 mg | Freq: Once | INTRAMUSCULAR | Status: AC
  Administered 2022-03-14: 1 mg via INTRAVENOUS
  Filled 2022-03-14: qty 1

## 2022-03-14 MED ORDER — ALPRAZOLAM 0.5 MG PO TABS
0.5000 mg | ORAL_TABLET | Freq: Three times a day (TID) | ORAL | Status: DC
  Administered 2022-03-14: 0.5 mg via ORAL
  Filled 2022-03-14 (×5): qty 1

## 2022-03-14 NOTE — Progress Notes (Addendum)
Blood glucose 113. When patted on shoulder opened eyes and said "huh?'

## 2022-03-14 NOTE — Progress Notes (Signed)
Was combative and confused through lunch today but able to get him to take his meds crushed and in applesauce.  For rest of day has been sleeping and woke up when changing IV dressing due to tape pulling arm hair and said to stop it and raised arm as if to hit but did not.  At supper time could not wake patient enough to take meds but did responded to sternal rub by charge nurse, Crystal.     Vitals stable but had congested cough.  Contacted Dr. Roger Shelter by secure chat and he ordered chest x ray and neuro checks Q 4.

## 2022-03-14 NOTE — Progress Notes (Signed)
PROGRESS NOTE     Guy Roberson, is a 88 y.o. male, DOB - 06/07/1931, ZYS:[id number]  Admit date - 03/04/2022   Admitting Physician Courage Mariea Clonts, MD  Outpatient Primary MD for the patient is [name], MD  LOS - 9  Chief Complaint  Patient presents with   Weakness      Subjective: Guy Roberson ,   Was seen and examined this morning, son and daughter-in-law present at bedside.  Patient is confused agitated Medically stable  Brief Narrative:  88 y.o. male with medical history significant for DM, HTN, complete heart block with pacemaker,  BPH admitted on 03/04/2022 with urinary incontinence and dyspnea in the setting of COVID-19 infection -Unable to go to SNF facility until after 10 days isolation period From COVID (Patient is post 10-day isolation, off isolation 03/14/2022)  Assessment and plan:   1) Generalized weakness/status post fall- --Multifactorial etiology suspected in a patient with COVID-19 infection and CHF  -Medically remained stable   -CT head without acute findings --Pt Was hoping to go home however unable to walk safely with physical therapy and did very poorly with ambulation with family at bedside.... Very high fall risk -Patient reluctantly agrees to go to SNF rehab  -Physical therapy eval appreciated recommends SNF rehab -SNF facility requires patient to be at least 10 days out from COVID diagnosis prior to be admitted to SNF (dxed 03/04/22) -Recurrent falls---PTA pt lived alone and did very poorly, patient has significant limitations with mobility related ADLs- this patient needs to continue to be monitored in the hospital until a SNF bed is obtained as she is not safe to go home with her current physcical limitations -- Patient continues to have recurrent falls even in the  hospital  03/14/2022 Agitated, confused this morning ...  -Not requiring sitter -- Continue fall precautions -  2)COVID-19 virus infection -Previously vaccinated -No  respiratory symptoms -Patient with significant weakness and malaise -Completed Paxlovid -Continue bronchodilators as needed -No Significant hypoxia and chest x-ray is not suggestive of COVID-pneumonia--- steroids was not utilized  Off isolation 03/14/2022  3)HFpEF--Acute on chronic diastolic congestive heart failure (HCC) -Echo with preserved EF of 50 to 55% with severe asymmetric LVH and mild pulmonary hypertension -No aortic stenosis -Lower extremity edema and dyspnea improving with diuresis-Dopplers without DVT -Admission chest x-ray was consistent with CHF  -Daily weights and fluid input and output monitoring -Continue lisinopril and metoprolol --Diuresed with Lasix initially -Oral intake is poor may need gentle IV fluid hydration -Avoid additional aggressive diuresis until oral intake improves  4)Hypokalemia/Hypophosphatemia/hypomagnesemia -In the setting of diuretic use -Replace and recheck -Resolved  5)Hypertension Stable. Continue metoprolol , lisinopril  -HCTZ discontinued   6)DM (diabetes mellitus) (HCC) .  A1c 6.5 reflecting good diabetic control PTA -Use Novolog/Humalog Sliding scale insulin with Accu-Cheks/Fingersticks as ordered   7)Urinary retention--- c/n  Flomax    8)Possible UTI----UA was somewhat suggestive of UTI -However urine culture with contaminated/mixed growth -Treated with IV Rocephin from 03/05/2022 through 03/07/2022 inclusive for 3 doses -No further antibiotics indicated  9)Thrombocytopenia---??  Related to infection -Platelets have normalized  10)Hospital-acquired delirium with increased confusion and falls--- - CT head post fall without acute abnormalities -Hospital delirium with confusion, disorientation and agitation is improving overall -Remove soft wrist restraints -- Continue fall precautions  Trazodone nightly -Lorazepam prn as ordered -Scheduled Xanax 0.5 mg p.o. 3 times daily, Seroquel 50 mg p.o. nightly -Haldol was  ineffective -As needed mitten placement  Disposition/Need for in-Hospital Stay- patient unable to  be discharged at this time due to - -Generalized weakness and falls at home--- awaiting transfer to SNF rehab --SNF facility requires patient to be at least 10 days out from COVID diagnosis prior to be admitted to SNF (dxed 03/04/22)>>>  off isolation 03/14/2022 ---  Patient continues to have recurrent falls even in the  hospital  Status is: Inpatient   Disposition: The patient is from: Home              Anticipated d/c is to: SNF              Anticipated d/c date is: 03/16/2022              Patient currently is not medically stable to d/c. Barriers: Not Clinically Stable-   Code Status :  -  Code Status: Partial Code   Family Communication: Previously discussed with daughter in law Danielle Lento----   DVT Prophylaxis  :   - SCDs   enoxaparin (LOVENOX) injection 40 mg Start: 03/04/22 2045  Lab Results  Component Value Date   PLT 192 03/11/2022   Inpatient Medications  Scheduled Meds:  ALPRAZolam  0.5 mg Oral TID   amLODipine  10 mg Oral Daily   aspirin EC  81 mg Oral q morning   enoxaparin (LOVENOX) injection  40 mg Subcutaneous Q24H   feeding supplement  237 mL Oral BID BM   furosemide  20 mg Oral Daily   insulin aspart  0-5 Units Subcutaneous QHS   insulin aspart  0-9 Units Subcutaneous TID WC   lisinopril  20 mg Oral BID   LORazepam  1 mg Intravenous Once   magic mouthwash w/lidocaine  5 mL Oral QID   metoprolol tartrate  50 mg Oral BID   pantoprazole  40 mg Oral BID AC   QUEtiapine  50 mg Oral QHS   tamsulosin  0.4 mg Oral QPC supper   traZODone  50 mg Oral QHS   Continuous Infusions:   PRN Meds:.acetaminophen **OR** acetaminophen, guaiFENesin-dextromethorphan, hydrALAZINE, polyethylene glycol, promethazine   Anti-infectives (From admission, onward)    Start     Dose/Rate Route Frequency Ordered Stop   03/05/22 1800  cefTRIAXone (ROCEPHIN) 1 g in sodium chloride  0.9 % 100 mL IVPB        1 g 200 mL/hr over 30 Minutes Intravenous Every 24 hours 03/05/22 1711 03/08/22 0854   03/05/22 1000  nirmatrelvir/ritonavir EUA (renal dosing) (PAXLOVID) 2 tablet        2 tablet Oral 2 times daily 03/05/22 0735 03/09/22 2143   03/04/22 2200  nirmatrelvir/ritonavir EUA (PAXLOVID) 3 tablet  Status:  Discontinued        3 tablet Oral 2 times daily 03/04/22 1927 03/05/22 0735         Objective: Vitals:   03/13/22 1228 03/13/22 1745 03/13/22 2200 03/14/22 0555  BP: (!) 151/69 (!) 156/92  (!) 188/52  Pulse: 66 63  64  Resp: 18     Temp: 97.7 F (36.5 C)     TempSrc: Oral     SpO2: 97% 95% 96% 96%  Weight:      Height:        Intake/Output Summary (Last 24 hours) at 03/14/2022 1046 Last data filed at 03/14/2022 0900 Gross per 24 hour  Intake 320 ml  Output --  Net 320 ml   Filed Weights   03/12/22 0532 03/13/22 0445 03/13/22 0500  Weight: 66.3 kg 62.7 kg 75.9 kg     Physical  Exam:   General:  Agitated, confused this a.m.  HEENT:  Normocephalic, PERRL, otherwise with in Normal limits   Neuro:  CNII-XII intact. , normal motor and sensation, reflexes intact   Lungs:   Clear to auscultation BL, Respirations unlabored,  No wheezes / crackles  Cardio:    S1/S2, RRR, No murmure, No Rubs or Gallops   Abdomen:  Soft, non-tender, bowel sounds active all four quadrants, no guarding or peritoneal signs.  Muscular  skeletal:  Limited exam -global generalized weaknesses - in bed, able to move all 4 extremities,   2+ pulses,  symmetric, No pitting edema  Skin:  Dry, warm to touch, negative for any Rashes,  Wounds: Please see nursing documentation             Data Reviewed: I have personally reviewed following labs and imaging studies  CBC: Recent Labs  Lab 03/08/22 0716 03/11/22 0403  WBC 5.4 9.5  NEUTROABS 3.5  --   HGB 15.1 13.3  HCT 42.9 39.2  MCV 89.4 92.9  PLT 181 192   Basic Metabolic Panel: Recent Labs  Lab 03/08/22 0716  03/08/22 0717 03/09/22 0501 03/11/22 0403 03/12/22 0659 03/13/22 0757 03/14/22 0507  NA 135 136 136 136 135 136 136  K 3.0* 3.0* 3.5 3.3* 3.1* 3.3* 3.2*  CL 95* 95* 100 102 100 99 100  CO2 25 27 26 27 30 28 26   GLUCOSE 123* 124* 137* 162* 130* 137* 123*  BUN 26* 27* 30* 31* 33* 25* 24*  CREATININE 1.01 1.02 0.95 0.90 0.83 0.79 0.90  CALCIUM 9.5 9.5 9.2 9.5 9.3 9.7 9.8  MG 2.2  --   --   --   --   --   --   PHOS 3.3 3.4  --  1.7*  --   --   --    GFR: Estimated Creatinine Clearance: 53 mL/min (by C-G formula based on SCr of 0.9 mg/dL). Liver Function Tests: Recent Labs  Lab 03/08/22 0716 03/08/22 0717 03/09/22 0501 03/11/22 0403  AST 79*  --  71*  --   ALT 32  --  39  --   ALKPHOS 55  --  49  --   BILITOT 1.6*  --  1.3*  --   PROT 6.3*  --  5.9*  --   ALBUMIN 3.6 3.6 3.3* 3.2*   HbA1C: No results for input(s): "HGBA1C" in the last 72 hours.  Recent Results (from the past 240 hour(s))  Blood Culture (routine x 2)     Status: None   Collection Time: 03/04/22 12:44 PM   Specimen: BLOOD  Result Value Ref Range Status   Specimen Description BLOOD RIGHT WRIST  Final   Special Requests   Final    BOTTLES DRAWN AEROBIC AND ANAEROBIC Blood Culture adequate volume   Culture   Final    NO GROWTH 5 DAYS Performed at [facility], [address], [place], [place]    Report Status 03/09/2022 FINAL  Final  SARS Coronavirus 2 by RT PCR (hospital order, performed in [facility] hospital lab) *cepheid single result test* Anterior Nasal Swab     Status: Abnormal   Collection Time: 03/04/22 12:58 PM   Specimen: Anterior Nasal Swab  Result Value Ref Range Status   SARS Coronavirus 2 by RT PCR POSITIVE (A) NEGATIVE Final    Comment: (NOTE) SARS-CoV-2 target nucleic acids are DETECTED  SARS-CoV-2 RNA is generally detectable in upper respiratory specimens  during the acute phase  of infection.  Positive results are indicative  of the presence of the identified virus, but do  not rule out bacterial infection or co-infection with other pathogens not detected by the test.  Clinical correlation with patient history and  other diagnostic information is necessary to determine patient infection status.  The expected result is negative.  Fact Sheet for Patients:   https://www.patel.info/   Fact Sheet for Healthcare Providers:   https://hall.com/    This test is not yet approved or cleared by the [place] FDA and  has been authorized for detection and/or diagnosis of SARS-CoV-2 by FDA under an Emergency Use Authorization (EUA).  This EUA will remain in effect (meaning this test can be used) for the duration of  the COVID-19 declaration under Section 564(b)(1)  of the Act, 21 U.S.C. section 360-bbb-3(b)(1), unless the authorization is terminated or revoked sooner.   Performed at [facility], [address], [place], [place] [address]   Blood Culture (routine x 2)     Status: None   Collection Time: 03/04/22  1:44 PM   Specimen: BLOOD  Result Value Ref Range Status   Specimen Description BLOOD LEFT WRIST  Final   Special Requests   Final    BOTTLES DRAWN AEROBIC AND ANAEROBIC Blood Culture results may not be optimal due to an excessive volume of blood received in culture bottles   Culture   Final    NO GROWTH 5 DAYS Performed at [facility], [address], [place], [place] [address]    Report Status 03/09/2022 FINAL  Final  Urine Culture     Status: Abnormal   Collection Time: 03/05/22 11:29 AM   Specimen: Urine, Clean Catch  Result Value Ref Range Status   Specimen Description   Final    URINE, CLEAN CATCH Performed at [facility], [address], [place], [place] [address]    Special Requests   Final    NONE Performed at [facility], [address], [place], [place] [address]    Culture MULTIPLE SPECIES PRESENT, SUGGEST RECOLLECTION (A)  Final   Report Status 03/06/2022 FINAL  Final    Radiology  Studies: No results found. Scheduled Meds:  ALPRAZolam  0.5 mg Oral TID   amLODipine  10 mg Oral Daily   aspirin EC  81 mg Oral q morning   enoxaparin (LOVENOX) injection  40 mg Subcutaneous Q24H   feeding supplement  237 mL Oral BID BM   furosemide  20 mg Oral Daily   insulin aspart  0-5 Units Subcutaneous QHS   insulin aspart  0-9 Units Subcutaneous TID WC   lisinopril  20 mg Oral BID   LORazepam  1 mg Intravenous Once   magic mouthwash w/lidocaine  5 mL Oral QID   metoprolol tartrate  50 mg Oral BID   pantoprazole  40 mg Oral BID AC   QUEtiapine  50 mg Oral QHS   tamsulosin  0.4 mg Oral QPC supper   traZODone  50 mg Oral QHS   Continuous Infusions:   LOS: 9 days   Deatra James M.D on 03/14/2022 at 10:46 AM  Go to [email or web address] - for contact info  [facility] - Office  [phone]  If 7PM-7AM, please contact night-coverage [email or web address] 03/14/2022, 10:46 AM

## 2022-03-15 DIAGNOSIS — R531 Weakness: Secondary | ICD-10-CM | POA: Diagnosis not present

## 2022-03-15 LAB — BASIC METABOLIC PANEL
Anion gap: 9 (ref 5–15)
BUN: 38 mg/dL — ABNORMAL HIGH (ref 8–23)
CO2: 27 mmol/L (ref 22–32)
Calcium: 9.9 mg/dL (ref 8.9–10.3)
Chloride: 101 mmol/L (ref 98–111)
Creatinine, Ser: 1.16 mg/dL (ref 0.61–1.24)
GFR, Estimated: >60 mL/min (ref >60)
Glucose, Bld: 89 mg/dL (ref 70–99)
Potassium: 3.4 mmol/L — ABNORMAL LOW (ref 3.5–5.1)
Sodium: 137 mmol/L (ref 135–145)

## 2022-03-15 LAB — GLUCOSE, CAPILLARY
Glucose-Capillary: 120 mg/dL — ABNORMAL HIGH (ref 70–99)
Glucose-Capillary: 133 mg/dL — ABNORMAL HIGH (ref 70–99)
Glucose-Capillary: 212 mg/dL — ABNORMAL HIGH (ref 70–99)
Glucose-Capillary: 156 mg/dL — ABNORMAL HIGH (ref 70–99)

## 2022-03-15 MED ORDER — POTASSIUM CHLORIDE CRYS ER 20 MEQ PO TBCR
40.0000 meq | EXTENDED_RELEASE_TABLET | Freq: Once | ORAL | Status: AC
  Administered 2022-03-15: 40 meq via ORAL
  Filled 2022-03-15: qty 2

## 2022-03-15 MED ORDER — FUROSEMIDE 20 MG PO TABS
20.0000 mg | ORAL_TABLET | Freq: Every day | ORAL | Status: DC
  Administered 2022-03-16: 20 mg via ORAL
  Filled 2022-03-15: qty 1

## 2022-03-15 MED ORDER — LISINOPRIL 10 MG PO TABS
10.0000 mg | ORAL_TABLET | Freq: Two times a day (BID) | ORAL | Status: DC
  Administered 2022-03-15 – 2022-03-16 (×3): 10 mg via ORAL
  Filled 2022-03-15 (×3): qty 1

## 2022-03-15 MED ORDER — PANTOPRAZOLE SODIUM 40 MG PO TBEC
40.0000 mg | DELAYED_RELEASE_TABLET | Freq: Every day | ORAL | Status: DC
  Administered 2022-03-15 – 2022-03-16 (×2): 40 mg via ORAL
  Filled 2022-03-15 (×2): qty 1

## 2022-03-15 MED ORDER — ONDANSETRON 4 MG PO TBDP: 4.0000 mg | ORAL_TABLET | Freq: Three times a day (TID) | ORAL | Status: DC | PRN

## 2022-03-15 NOTE — Plan of Care (Signed)
  Problem: Education: Goal: Knowledge of General Education information will improve Description Including pain rating scale, medication(s)/side effects and non-pharmacologic comfort measures Outcome: Progressing   Problem: Health Behavior/Discharge Planning: Goal: Ability to manage health-related needs will improve Outcome: Progressing   

## 2022-03-15 NOTE — Progress Notes (Signed)
Patient and family requested a donut fot patient to sit on. I notified Dr. Roger Shelter and requested one.

## 2022-03-15 NOTE — Plan of Care (Signed)
  Problem: Education: Goal: Knowledge of General Education information will improve Description: Including pain rating scale, medication(s)/side effects and non-pharmacologic comfort measures 03/15/2022 1439 by Santa Lighter, RN Outcome: Progressing 03/15/2022 1433 by Santa Lighter, RN Outcome: Progressing   Problem: Health Behavior/Discharge Planning: Goal: Ability to manage health-related needs will improve 03/15/2022 1439 by Santa Lighter, RN Outcome: Progressing 03/15/2022 1433 by Santa Lighter, RN Outcome: Progressing

## 2022-03-15 NOTE — Progress Notes (Signed)
Pt overall, had a good night. At beginning of shift patient was only aroused by touch. This RN held sedating meds overnight due to excessive drowsiness. He is now more alert and arouses to voice. He is alert and oriented to self but is aware that he is in the hospital. He is in a pleasant mood. Bryson Corona Edd Fabian

## 2022-03-15 NOTE — Progress Notes (Signed)
PROGRESS NOTE     Guy Roberson, is a 88 y.o. male, DOB - 10/07/1931, XNA:[id number]  Admit date - 03/04/2022   Admitting Physician Courage Denton Brick, MD  Outpatient Primary MD for the patient is [name], MD  LOS - 10  Chief Complaint  Patient presents with   Weakness      Subjective: Guy Roberson , was seen and examined this morning, comfortably resting, responded to Xanax,  Family concern for and hospital delirium agitation-gust with her son at bedside    Brief Narrative:  88 y.o. male with medical history significant for DM, HTN, complete heart block with pacemaker,  BPH admitted on 03/04/2022 with urinary incontinence and dyspnea in the setting of COVID-19 infection -Unable to go to SNF facility until after 10 days isolation period From COVID (Patient is post 10-day isolation, off isolation 03/14/2022)  Assessment and plan:   1) Generalized weakness/status post fall- --Multifactorial etiology suspected in a patient with COVID-19 infection and CHF  -Medically remained stable   -CT head without acute findings  --Pt Was hoping to go home however unable to walk safely with physical therapy and did very poorly with ambulation with family at bedside.... Very high fall risk -Patient reluctantly agrees to go to SNF rehab  -Physical therapy eval appreciated recommends SNF rehab -SNF facility requires patient to be at least 10 days out from [place] diagnosis prior to be admitted to SNF (dxed 03/04/22) -Recurrent falls---PTA pt lived alone and did very poorly, patient has significant limitations with mobility related ADLs- this patient needs to continue to be monitored in the hospital until a SNF bed is obtained as she is not safe to go home with her current physcical limitations -- Patient continues to have recurrent falls even in the  hospital  03/15/2022 Improved agitation -Responding to Xanax and nightly Seroquel -Not requiring sitter -- Continue fall precautions  -  2)COVID-19 virus infection -Previously vaccinated, no respiratory symptoms -Status post treatment with Paxlovid -Postacute infection-of isolation now  -Patient with significant weakness and malaise -Continue bronchodilators as needed -No Significant hypoxia and chest x-ray is not suggestive of COVID-pneumonia--- steroids was not utilized  Off isolation as of 03/14/2022  3)HFpEF--Acute on chronic diastolic congestive heart failure (HCC) -Echo with preserved EF of 50 to 55% with severe asymmetric LVH and mild pulmonary hypertension -No aortic stenosis -Lower extremity edema and dyspnea improving with diuresis-Dopplers without DVT -Admission chest x-ray was consistent with CHF  -Daily weights and fluid input and output monitoring -Continue lisinopril and metoprolol --Diuresed with Lasix initially -Oral intake is poor may need gentle IV fluid hydration -Avoid additional aggressive diuresis until oral intake improves  4)Hypokalemia/Hypophosphatemia/hypomagnesemia -In the setting of diuretic use Monitoring repleting accordingly  5)Hypertension Stable. Continue metoprolol , lisinopril  -HCTZ discontinued   6)DM (diabetes mellitus) ([place]) A1c 6.5 reflecting good diabetic control PTA -Use Novolog/Humalog Sliding scale insulin with Accu-Cheks/Fingersticks as ordered   7)Urinary retention--- c/n  Flomax    8)Possible UTI----UA was somewhat suggestive of UTI -However urine culture with contaminated/mixed growth -Treated with IV Rocephin from 03/05/2022 through 03/07/2022 inclusive for 3 doses -No further antibiotics indicated  9)Thrombocytopenia---??  Related to infection -Platelets have normalized  10)Hospital-acquired delirium with increased confusion and falls--- - CT head post fall without acute abnormalities -Hospital delirium with confusion, disorientation and agitation is improving overall -Remove soft wrist restraints -- Continue fall precautions  Trazodone  nightly -Lorazepam prn as ordered -Scheduled Xanax 0.5 mg p.o. 3 times daily, Seroquel 50 mg p.o. nightly  -  Haldol was ineffective   Disposition/Need for in-Hospital Stay- patient unable to be discharged at this time due to - -Generalized weakness and falls at home--- awaiting transfer to SNF rehab --SNF facility requires patient to be at least 10 days out from COVID diagnosis prior to be admitted to SNF (dxed 03/04/22)>>>  off isolation 03/14/2022 ---  Patient continues to have recurrent falls even in the  hospital  Status is: Inpatient   Disposition: The patient is from: Home              Anticipated d/c is to: SNF              Anticipated d/c date is: 03/16/2022              Patient currently medically stable to be discharged Barriers:  Clinically Stable now    Code Status :  -  Code Status: Partial Code   Family Communication: Previously discussed with daughter in law Fady Stamps----   DVT Prophylaxis  :   - SCDs   enoxaparin (LOVENOX) injection 40 mg Start: 03/04/22 2045  Lab Results  Component Value Date   PLT 192 03/11/2022   Inpatient Medications  Scheduled Meds:  ALPRAZolam  0.5 mg Oral TID   amLODipine  10 mg Oral Daily   aspirin EC  81 mg Oral q morning   enoxaparin (LOVENOX) injection  40 mg Subcutaneous Q24H   feeding supplement  237 mL Oral BID BM   [START ON 03/16/2022] furosemide  20 mg Oral Daily   insulin aspart  0-5 Units Subcutaneous QHS   insulin aspart  0-9 Units Subcutaneous TID WC   lisinopril  10 mg Oral BID   magic mouthwash w/lidocaine  5 mL Oral QID   metoprolol tartrate  50 mg Oral BID   pantoprazole  40 mg Oral Daily   QUEtiapine  50 mg Oral QHS   tamsulosin  0.4 mg Oral QPC supper   Continuous Infusions:   PRN Meds:.acetaminophen **OR** acetaminophen, guaiFENesin-dextromethorphan, hydrALAZINE, ondansetron, polyethylene glycol   Anti-infectives (From admission, onward)    Start     Dose/Rate Route Frequency Ordered Stop   03/05/22  1800  cefTRIAXone (ROCEPHIN) 1 g in sodium chloride 0.9 % 100 mL IVPB        1 g 200 mL/hr over 30 Minutes Intravenous Every 24 hours 03/05/22 1711 03/08/22 0854   03/05/22 1000  nirmatrelvir/ritonavir EUA (renal dosing) (PAXLOVID) 2 tablet        2 tablet Oral 2 times daily 03/05/22 0735 03/09/22 2143   03/04/22 2200  nirmatrelvir/ritonavir EUA (PAXLOVID) 3 tablet  Status:  Discontinued        3 tablet Oral 2 times daily 03/04/22 1927 03/05/22 0735         Objective: Vitals:   03/14/22 2307 03/15/22 0418 03/15/22 0700 03/15/22 1010  BP: (!) 127/54 125/80 (!) 124/48   Pulse: 62 (!) 43 63 70  Resp: 18 16 16    Temp:  98.3 F (36.8 C) 98.7 F (37.1 C)   TempSrc:   Oral   SpO2: 99% (!) 89% 96%   Weight:  76.1 kg    Height:  5\' 7"  (1.702 m)      Intake/Output Summary (Last 24 hours) at 03/15/2022 1149 Last data filed at 03/15/2022 0300 Gross per 24 hour  Intake 0 ml  Output 500 ml  Net -500 ml   Filed Weights   03/13/22 0445 03/13/22 0500 03/15/22 0418  Weight: 62.7 kg 75.9  kg 76.1 kg     General:  Agitated, confused, currently comfortable on Xanax  HEENT:  Normocephalic, PERRL, otherwise with in Normal limits   Neuro:  CNII-XII intact. , normal motor and sensation, reflexes intact   Lungs:   Clear to auscultation BL, Respirations unlabored,  No wheezes / crackles  Cardio:    S1/S2, RRR, No murmure, No Rubs or Gallops   Abdomen:  Soft, non-tender, bowel sounds active all four quadrants, no guarding or peritoneal signs.  Muscular  skeletal:  Limited exam -global generalized weaknesses - in bed, able to move all 4 extremities,   2+ pulses,  symmetric, No pitting edema  Skin:  Dry, warm to touch, negative for any Rashes,  Wounds: Please see nursing documentation                 Data Reviewed: I have personally reviewed following labs and imaging studies  CBC: Recent Labs  Lab 03/11/22 0403  WBC 9.5  HGB 13.3  HCT 39.2  MCV 92.9  PLT 192   Basic  Metabolic Panel: Recent Labs  Lab 03/11/22 0403 03/12/22 0659 03/13/22 0757 03/14/22 0507 03/15/22 0517  NA 136 135 136 136 137  K 3.3* 3.1* 3.3* 3.2* 3.4*  CL 102 100 99 100 101  CO2 27 30 28 26 27   GLUCOSE 162* 130* 137* 123* 89  BUN 31* 33* 25* 24* 38*  CREATININE 0.90 0.83 0.79 0.90 1.16  CALCIUM 9.5 9.3 9.7 9.8 9.9  PHOS 1.7*  --   --   --   --    GFR: Estimated Creatinine Clearance: 41.2 mL/min (by C-G formula based on SCr of 1.16 mg/dL). Liver Function Tests: Recent Labs  Lab 03/09/22 0501 03/11/22 0403  AST 71*  --   ALT 39  --   ALKPHOS 49  --   BILITOT 1.3*  --   PROT 5.9*  --   ALBUMIN 3.3* 3.2*   HbA1C: No results for input(s): "HGBA1C" in the last 72 hours.  No results found for this or any previous visit (from the past 240 hour(s)).   Radiology Studies: DG CHEST PORT 1 VIEW  Result Date: 03/14/2022 CLINICAL DATA:  Dyspnea EXAM: PORTABLE CHEST 1 VIEW COMPARISON:  Chest x-ray 03/04/2022 FINDINGS: Patient is status post cardiac surgery. Left-sided pacemaker is again seen. Cardiomediastinal silhouette is within normal limits. There is no lung consolidation, pleural effusion or pneumothorax. There is some chronic appearing linear opacities in the left lung base. No pleural effusion or pneumothorax. No acute fractures. There are healed left-sided rib fractures. IMPRESSION: No active disease. Chronic appearing linear opacities in the left lung base. Electronically Signed   By: 05/04/2022 M.D.   On: 03/14/2022 18:30   Scheduled Meds:  ALPRAZolam  0.5 mg Oral TID   amLODipine  10 mg Oral Daily   aspirin EC  81 mg Oral q morning   enoxaparin (LOVENOX) injection  40 mg Subcutaneous Q24H   feeding supplement  237 mL Oral BID BM   [START ON 03/16/2022] furosemide  20 mg Oral Daily   insulin aspart  0-5 Units Subcutaneous QHS   insulin aspart  0-9 Units Subcutaneous TID WC   lisinopril  10 mg Oral BID   magic mouthwash w/lidocaine  5 mL Oral QID   metoprolol  tartrate  50 mg Oral BID   pantoprazole  40 mg Oral Daily   QUEtiapine  50 mg Oral QHS   tamsulosin  0.4 mg Oral QPC supper  Continuous Infusions:   LOS: 10 days   Kendell Bane M.D on 03/15/2022 at 11:49 AM  Go to [email or web address] - for contact info  [facility] - Office  [phone]  If 7PM-7AM, please contact night-coverage [email or web address] 03/15/2022, 11:49 AM

## 2022-03-15 NOTE — Progress Notes (Signed)
Physical Therapy Treatment Patient Details Name: Guy Roberson MRN: [id number] DOB: 06-Jun-1931 Today's Date: 03/15/2022   History of Present Illness Guy Roberson is a 88 y.o. male with medical history significant for DM, HTN, complete heart block with pacemaker, urinary artery disease, BPH.  Patient was to the ED via EMS reports of generalized weakness, inability to ambulate.  Symptoms started about 2 days ago with cough.  This morning he was so weak he was unable to stand.  At baseline he uses a walker, he got out of bed this morning, normally his legs do not reach the floor when he is sitting on his bed, and on attempting to stand he slid off the bed to the floor.  He does not know if he hit his head.  Fall was unwitnessed.  He denies difficulty breathing.  He has chronic straining and dribbling otherwise no dysuria or frequency.  He had an episode of fecal incontinence yesterday.  No vomiting.  He got at least 2 shots maybe 3 of the COVID-vaccine.  He has not had COVID infection before.  He has had lower extremity swelling over the past several weeks to months.    PT Comments    Patient presents alert, cooperative and agreeable for therapy and able to follow most directions consistently with occasional repeated verbal/tactile cueing.  Patient demonstrates improvement for sitting up at bedside without out extensor tone in back and legs, completed BLE exercises while seated at bedside without loss of sitting balance and limited to a few slow labored unsteady sides due to buckling of knees with feet sliding forward.  Patient tolerated sitting up in chair after therapy with family members present in room.  Patient will benefit from continued skilled physical therapy in hospital and recommended venue below to increase strength, balance, endurance for safe ADLs and gait.     Recommendations for follow up therapy are one component of a multi-disciplinary discharge planning process, led by the attending  physician.  Recommendations may be updated based on patient status, additional functional criteria and insurance authorization.  Follow Up Recommendations  Skilled nursing-short term rehab (<3 hours/day) Can patient physically be transported by private vehicle: No   Assistance Recommended at Discharge    Patient can return home with the following A lot of help with bathing/dressing/bathroom;A lot of help with walking and/or transfers;Help with stairs or ramp for entrance;Assistance with cooking/housework   Equipment Recommendations  None recommended by PT    Recommendations for Other Services       Precautions / Restrictions Precautions Precautions: Fall Restrictions Weight Bearing Restrictions: No     Mobility  Bed Mobility Overal bed mobility: Needs Assistance Bed Mobility: Supine to Sit     Supine to sit: Min assist, Mod assist     General bed mobility comments: increased time, labored movement, able to flex knees/hips without problem    Transfers Overall transfer level: Needs assistance Equipment used: Rolling walker (2 wheels) Transfers: Sit to/from Stand, Bed to chair/wheelchair/BSC Sit to Stand: Mod assist   Step pivot transfers: Mod assist       General transfer comment: slow labored movement with frequent buckling of knees    Ambulation/Gait Ambulation/Gait assistance: Mod assist, Max assist Gait Distance (Feet): 5 Feet Assistive device: Rolling walker (2 wheels) Gait Pattern/deviations: Decreased step length - right, Decreased step length - left, Decreased stride length, Knees buckling, Shuffle Gait velocity: slow     General Gait Details: limited to a few slow labored shuffling side  steps at bedside due to feet sliding forward and buckling of knees   Stairs             Wheelchair Mobility    Modified Rankin (Stroke Patients Only)       Balance Overall balance assessment: Needs assistance Sitting-balance support: Feet supported, No  upper extremity supported Sitting balance-Leahy Scale: Fair Sitting balance - Comments: fair/good seated at EOB   Standing balance support: During functional activity, Reliant on assistive device for balance, Bilateral upper extremity supported Standing balance-Leahy Scale: Poor Standing balance comment: using RW                            Cognition Arousal/Alertness: Awake/alert Behavior During Therapy: Anxious, WFL for tasks assessed/performed Overall Cognitive Status: Within Functional Limits for tasks assessed                                 General Comments: requires occasional repeated verbal/tactle cueing to complete functional tasks        Exercises General Exercises - Lower Extremity Long Arc Quad: Seated, Strengthening, Both, 10 reps, AROM Hip Flexion/Marching: Seated, Strengthening, Both, 10 reps, AROM Toe Raises: Seated, AROM, Strengthening, Both, 10 reps Heel Raises: Seated, AROM, Strengthening, Both, 10 reps    General Comments        Pertinent Vitals/Pain Pain Assessment Pain Assessment: No/denies pain    Home Living                          Prior Function            PT Goals (current goals can now be found in the care plan section) Acute Rehab PT Goals Patient Stated Goal: return home PT Goal Formulation: With patient/family Time For Goal Achievement: 03/20/22 Potential to Achieve Goals: Good Progress towards PT goals: Progressing toward goals    Frequency    Min 3X/week      PT Plan Current plan remains appropriate    Co-evaluation              AM-PAC PT "6 Clicks" Mobility   Outcome Measure  Help needed turning from your back to your side while in a flat bed without using bedrails?: A Little Help needed moving from lying on your back to sitting on the side of a flat bed without using bedrails?: A Lot Help needed moving to and from a bed to a chair (including a wheelchair)?: A Lot Help needed  standing up from a chair using your arms (e.g., wheelchair or bedside chair)?: A Lot Help needed to walk in hospital room?: A Lot Help needed climbing 3-5 steps with a railing? : Total 6 Click Score: 12    End of Session Equipment Utilized During Treatment: Gait belt Activity Tolerance: Patient tolerated treatment well;Patient limited by fatigue Patient left: in chair;with call bell/phone within reach;with family/visitor present;with chair alarm set Nurse Communication: Mobility status PT Visit Diagnosis: Unsteadiness on feet (R26.81);Other abnormalities of gait and mobility (R26.89);Muscle weakness (generalized) (M62.81);History of falling (Z91.81)     Time: [id number] PT Time Calculation (min) (ACUTE ONLY): 19 min  Charges:  $Therapeutic Exercise: 8-22 mins $Therapeutic Activity: 8-22 mins                     1:20 PM, 03/15/22 Lonell Grandchild, MPT Physical Therapist with [facility]  [phone] office [phone] mobile phone

## 2022-03-16 DIAGNOSIS — R531 Weakness: Secondary | ICD-10-CM | POA: Diagnosis not present

## 2022-03-16 LAB — GLUCOSE, CAPILLARY
Glucose-Capillary: 140 mg/dL — ABNORMAL HIGH (ref 70–99)
Glucose-Capillary: 221 mg/dL — ABNORMAL HIGH (ref 70–99)
Glucose-Capillary: 235 mg/dL — ABNORMAL HIGH (ref 70–99)

## 2022-03-16 LAB — BASIC METABOLIC PANEL
Anion gap: 8 (ref 5–15)
BUN: 42 mg/dL — ABNORMAL HIGH (ref 8–23)
CO2: 27 mmol/L (ref 22–32)
Calcium: 9.6 mg/dL (ref 8.9–10.3)
Chloride: 98 mmol/L (ref 98–111)
Creatinine, Ser: 1.22 mg/dL (ref 0.61–1.24)
GFR, Estimated: 57 mL/min — ABNORMAL LOW (ref >60)
Glucose, Bld: 116 mg/dL — ABNORMAL HIGH (ref 70–99)
Potassium: 3.5 mmol/L (ref 3.5–5.1)
Sodium: 133 mmol/L — ABNORMAL LOW (ref 135–145)

## 2022-03-16 MED ORDER — FUROSEMIDE 20 MG PO TABS: 20.0000 mg | ORAL_TABLET | Freq: Every day | ORAL | 0 refills | Status: DC

## 2022-03-16 MED ORDER — LISINOPRIL 10 MG PO TABS: 10.0000 mg | ORAL_TABLET | Freq: Two times a day (BID) | ORAL | 0 refills | Status: DC

## 2022-03-16 MED ORDER — ALPRAZOLAM 0.5 MG PO TABS: 0.5000 mg | ORAL_TABLET | Freq: Three times a day (TID) | ORAL | 0 refills | Status: AC

## 2022-03-16 MED ORDER — QUETIAPINE FUMARATE 50 MG PO TABS: 50.0000 mg | ORAL_TABLET | Freq: Every day | ORAL | 0 refills | Status: DC

## 2022-03-16 MED ORDER — AMLODIPINE BESYLATE 10 MG PO TABS: 10.0000 mg | ORAL_TABLET | Freq: Every day | ORAL | 0 refills | Status: DC

## 2022-03-16 MED ORDER — ENSURE ENLIVE PO LIQD: 237.0000 mL | Freq: Two times a day (BID) | ORAL | 12 refills | Status: DC

## 2022-03-16 MED ORDER — MAGIC MOUTHWASH W/LIDOCAINE: 5.0000 mL | Freq: Four times a day (QID) | ORAL | 0 refills | Status: AC

## 2022-03-16 NOTE — Progress Notes (Signed)
Ng Discharge Note  Admit Date:  03/04/2022 Discharge date: 03/16/2022   Guy Roberson to be D/C'd Skilled nursing facility per MD order.  AVS completed. Patient/caregiver able to verbalize understanding.  Discharge Medication: Allergies as of 03/16/2022       Reactions   Penicillins Other (See Comments)   Unknown reaction  Did it involve swelling of the face/tongue/throat, SOB, or low BP? Unknown Did it involve sudden or severe rash/hives, skin peeling, or any reaction on the inside of your mouth or nose? Unknown Did you need to seek medical attention at a hospital or doctor's office? Unknown When did it last happen?      childhood allergy If all above answers are "NO", may proceed with cephalosporin use.        Medication List     STOP taking these medications    cyanocobalamin 1000 MCG tablet Commonly known as: VITAMIN B12   hydrochlorothiazide 25 MG tablet Commonly known as: HYDRODIURIL   HYDROcodone-acetaminophen 5-325 MG tablet Commonly known as: NORCO/VICODIN   LORazepam 1 MG tablet Commonly known as: ATIVAN   simvastatin 20 MG tablet Commonly known as: ZOCOR       TAKE these medications    acetaminophen 650 MG CR tablet Commonly known as: TYLENOL Take 650 mg by mouth every 8 (eight) hours as needed for pain or fever.   ALPRAZolam 0.5 MG tablet Commonly known as: XANAX Take 1 tablet (0.5 mg total) by mouth 3 (three) times daily for 10 days. What changed: when to take this   amLODipine 10 MG tablet Commonly known as: NORVASC Take 1 tablet (10 mg total) by mouth daily.   aspirin EC 81 MG tablet Take 1 tablet (81 mg total) by mouth every morning.   cetirizine 10 MG tablet Commonly known as: ZYRTEC Take 10 mg by mouth daily as needed for allergies.   Constulose 10 GM/15ML solution Generic drug: lactulose Take 30 mLs by mouth 2 (two) times daily as needed for moderate constipation.   feeding supplement Liqd Take 237 mLs by mouth 2 (two) times  daily between meals.   furosemide 20 MG tablet Commonly known as: LASIX Take 1 tablet (20 mg total) by mouth daily.   lisinopril 10 MG tablet Commonly known as: ZESTRIL Take 1 tablet (10 mg total) by mouth 2 (two) times daily. What changed:  medication strength how much to take   magic mouthwash w/lidocaine Soln Take 5 mLs by mouth 4 (four) times daily.   Melatonin 3 MG Caps Take 3 mg by mouth at bedtime.   metoprolol tartrate 50 MG tablet Commonly known as: LOPRESSOR Take 50 mg by mouth 2 (two) times daily.   pantoprazole 20 MG tablet Commonly known as: PROTONIX TAKE ONE TABLET (20MG  TOTAL) BY MOUTH TWO TIMES DAILY BEFORE A MEAL What changed: See the new instructions.   potassium chloride SA 20 MEQ tablet Commonly known as: KLOR-CON M Take 20 mEq by mouth every morning.   QUEtiapine 50 MG tablet Commonly known as: SEROQUEL Take 1 tablet (50 mg total) by mouth at bedtime.   tamsulosin 0.4 MG Caps capsule Commonly known as: FLOMAX Take 0.4 mg by mouth daily.        Discharge Assessment: Vitals:   03/15/22 2304 03/16/22 0539  BP: (!) 137/48 (!) 146/54  Pulse: 62 61  Resp: 18 18  Temp: 97.9 F (36.6 C) (!) 97.5 F (36.4 C)  SpO2: 98% 99%   Skin clean, dry and intact without evidence of skin  break down, no evidence of skin tears noted. IV catheter discontinued intact. Site without signs and symptoms of complications - no redness or edema noted at insertion site, patient denies c/o pain - only slight tenderness at site.  Dressing with slight pressure applied.  D/c Instructions-Education: Discharge instructions given to patient/family with verbalized understanding. D/c education completed with patient/family including follow up instructions, medication list, d/c activities limitations if indicated, with other d/c instructions as indicated by MD - patient able to verbalize understanding, all questions fully answered. Patient instructed to return to ED, call 911,  or call MD for any changes in condition.  Patient escorted via stretcher/EMS, and D/C SNF via EMS.  [name], LPN [id number] 9:39 PM

## 2022-03-16 NOTE — TOC Transition Note (Signed)
Transition of Care [facility]) - CM/SW Discharge Note   Patient Details  Name: Guy Roberson MRN: [id number] Date of Birth: 01-12-1932  [facility] CM/SW Contact:  Shade Flood, LCSW Phone Number: 03/16/2022, 11:19 AM   Clinical Narrative:     Pt stable for dc to [facility] today per MD. Updated Bryson Ha at [facility] and they can accept pt today. Spoke with pt's DIL, Rodena Piety, to review dc plan and she and family remain in agreement. DC clinical sent electronically. RN to call report. EMS to transport.  There are no other TOC needs for dc.  Final next level of care: Skilled Nursing Facility Barriers to Discharge: Barriers Resolved   Patient Goals and CMS Choice Patient states their goals for this hospitalization and ongoing recovery are:: go home CMS Medicare.gov Compare Post Acute Care list provided to:: Patient Represenative (must comment) Choice offered to / list presented to : Adult Children  Discharge Placement              Patient chooses bed at: [facility] Patient to be transferred to facility by: EMS Name of family member notified: Rodena Piety Patient and family notified of of transfer: 03/16/22  Discharge Plan and Services In-house Referral: Clinical Social Work   Post Acute Care Choice: [place]                               Social Determinants of Health (SDOH) Interventions     Readmission Risk Interventions    03/06/2022    2:29 PM  Readmission Risk Prevention Plan  Transportation Screening Complete  Home Care Screening Complete  Medication Review (RN CM) Complete

## 2022-03-16 NOTE — Progress Notes (Signed)
Patient rested quietly throughout the night, changed bed linens in am for peri and incontinence care. Fluids offered and consumed. VS remain stable, telemetry in place, call bell within reach.

## 2022-03-16 NOTE — Discharge Summary (Addendum)
Physician Discharge Summary   Patient: Guy Roberson MRN: [id number] DOB: [birth year]  Admit date:     03/04/2022  Discharge date: 03/16/22  Discharge Physician: Deatra James   PCP: [name], MD   Recommendations at discharge:   Follow-up with a PCP in 1 week Follow-up with palliative care as an outpatient  Continue current medication may be modified by PCP in 1 week mentation improves  Discharge Diagnoses: Principal Problem:   Generalized weakness Active Problems:   COVID-19 virus infection   Acute congestive heart failure (HCC)   DM (diabetes mellitus) (HCC)   Complete heart block (HCC)   Hypertension   CAD (coronary artery disease)   Pacemaker-[employer]   Hypokalemia   Acute exacerbation of CHF (congestive heart failure) ([place])  Resolved Problems:   * No resolved hospital problems. [facility] Course:  DARELLE Roberson is a 88 y.o. male with medical history significant for DM, HTN, complete heart block with pacemaker,  BPH admitted on 03/04/2022 with urinary incontinence and dyspnea in the setting of COVID-19 infection (Patient is post 10-day isolation, off isolation 03/14/2022)   Assessment and plan:    1) Generalized weakness/status post fall- --Multifactorial etiology suspected in a patient with COVID-19 infection and CHF  -Medically remained stable   -CT head without acute findings   --Pt Was hoping to go home however unable to walk safely with physical therapy and did very poorly with ambulation with family at bedside.... Very high fall risk -Patient reluctantly agrees to go to SNF rehab   -Physical therapy eval appreciated recommends SNF rehab -SNF facility requires patient to be at least 10 days out from [place] diagnosis prior to be admitted to SNF (dxed 03/04/22) -Recurrent falls---PTA pt lived alone and did very poorly, patient has significant limitations with mobility related ADLs- this patient needs to continue to be monitored in the hospital until a  SNF bed is obtained as she is not safe to go home with her current physcical limitations -- Patient continues to have recurrent falls even in the  hospital   03/16/2022 Improved agitation -Continue Xanax and nightly Seroquel--dose may be adjusted for improved mentation -Not requiring sitter -- Continue fall precautions -   2)COVID-19 virus infection -Previously vaccinated, no respiratory symptoms -Status post treatment with Paxlovid -Postacute infection-of isolation now  -Patient with significant weakness and malaise -Continue bronchodilators as needed -No Significant hypoxia and chest x-ray is not suggestive of COVID-pneumonia--- steroids was not utilized   Off isolation as of 03/14/2022   3)HFpEF--Acute on chronic diastolic congestive heart failure (HCC) -Echo with preserved EF of 50 to 55% with severe asymmetric LVH and mild pulmonary hypertension -No aortic stenosis -Lower extremity edema and dyspnea improving with diuresis-Dopplers without DVT -Admission chest x-ray was consistent with CHF  -Daily weights and fluid input and output monitoring -Continue lisinopril and metoprolol --Diuresed with Lasix initially -Oral intake is poor may need gentle IV fluid hydration -Avoid additional aggressive diuresis until oral intake improves -Continue monitoring daily weight   4)Hypokalemia/Hypophosphatemia/hypomagnesemia -In the setting of diuretic use -Resolved   5)Hypertension Stable. Continue metoprolol , lisinopril  --- doses were adjusted -HCTZ discontinued    6)DM (diabetes mellitus) (HCC) A1c 6.5 reflecting good diabetic control PTA -Use Novolog/Humalog Sliding scale insulin with Accu-Cheks/Fingersticks as ordered    7)Urinary retention--- c/n  Flomax     8)Possible UTI----UA was somewhat suggestive of UTI -However urine culture with contaminated/mixed growth -Treated with IV Rocephin from 03/05/2022 through 03/07/2022 inclusive for 3  doses -No further antibiotics  indicated   9)Thrombocytopenia---??  Related to infection -Platelets have normalized   10)Hospital-acquired delirium with increased confusion and falls--- - CT head post fall without acute abnormalities -Hospital delirium with confusion, disorientation and agitation is improving overall -Remove soft wrist restraints -- Continue fall precautions  Trazodone nightly -Lorazepam prn as ordered -Scheduled Xanax 0.5 mg p.o. 3 times daily, Seroquel 50 mg p.o. nightly   -Haldol was ineffective     Disposition/-- awaiting transfer to SNF rehab --SNF facility requires patient to be at least 10 days out from [place] diagnosis prior to be admitted to SNF (dxed 03/04/22)>>>  off isolation 03/14/2022 ---  Patient continues to have recurrent falls even in the  hospital   Status is: Inpatient    Disposition: The patient is from: Home              Anticipated d/c is to: SNF              Anticipated d/c date is: 03/16/2022            Barriers:  Clinically Stable now     Code Status :  -  Code Status: Partial Code         Pain control - [place] Controlled Substance Reporting System database was reviewed. and patient was instructed, not to drive, operate heavy machinery, perform activities at heights, swimming or participation in water activities or provide baby-sitting services while on Pain, Sleep and Anxiety Medications; until their outpatient Physician has advised to do so again. Also recommended to not to take more than prescribed Pain, Sleep and Anxiety Medications.    Disposition: Nursing home Diet recommendation:  Discharge Diet Orders (From admission, onward)     Start     Ordered   03/16/22 0000  Diet - low sodium heart healthy        03/16/22 0735           Regular diet DISCHARGE MEDICATION: Allergies as of 03/16/2022       Reactions   Penicillins Other (See Comments)   Unknown reaction  Did it involve swelling of the face/tongue/throat, SOB, or low BP?  Unknown Did it involve sudden or severe rash/hives, skin peeling, or any reaction on the inside of your mouth or nose? Unknown Did you need to seek medical attention at a hospital or doctor's office? Unknown When did it last happen?      childhood allergy If all above answers are "NO", may proceed with cephalosporin use.        Medication List     STOP taking these medications    cyanocobalamin 1000 MCG tablet Commonly known as: VITAMIN B12   hydrochlorothiazide 25 MG tablet Commonly known as: HYDRODIURIL   HYDROcodone-acetaminophen 5-325 MG tablet Commonly known as: NORCO/VICODIN   LORazepam 1 MG tablet Commonly known as: ATIVAN   simvastatin 20 MG tablet Commonly known as: ZOCOR       TAKE these medications    acetaminophen 650 MG CR tablet Commonly known as: TYLENOL Take 650 mg by mouth every 8 (eight) hours as needed for pain or fever.   ALPRAZolam 0.5 MG tablet Commonly known as: XANAX Take 1 tablet (0.5 mg total) by mouth 3 (three) times daily for 10 days. What changed: when to take this   amLODipine 10 MG tablet Commonly known as: NORVASC Take 1 tablet (10 mg total) by mouth daily.   aspirin EC 81 MG tablet Take 1 tablet (81 mg total)  by mouth every morning.   cetirizine 10 MG tablet Commonly known as: ZYRTEC Take 10 mg by mouth daily as needed for allergies.   Constulose 10 GM/15ML solution Generic drug: lactulose Take 30 mLs by mouth 2 (two) times daily as needed for moderate constipation.   feeding supplement Liqd Take 237 mLs by mouth 2 (two) times daily between meals.   furosemide 20 MG tablet Commonly known as: LASIX Take 1 tablet (20 mg total) by mouth daily.   lisinopril 10 MG tablet Commonly known as: ZESTRIL Take 1 tablet (10 mg total) by mouth 2 (two) times daily. What changed:  medication strength how much to take   magic mouthwash w/lidocaine Soln Take 5 mLs by mouth 4 (four) times daily.   Melatonin 3 MG Caps Take 3 mg  by mouth at bedtime.   metoprolol tartrate 50 MG tablet Commonly known as: LOPRESSOR Take 50 mg by mouth 2 (two) times daily.   pantoprazole 20 MG tablet Commonly known as: PROTONIX TAKE ONE TABLET (20MG  TOTAL) BY MOUTH TWO TIMES DAILY BEFORE A MEAL What changed: See the new instructions.   potassium chloride SA 20 MEQ tablet Commonly known as: KLOR-CON M Take 20 mEq by mouth every morning.   QUEtiapine 50 MG tablet Commonly known as: SEROQUEL Take 1 tablet (50 mg total) by mouth at bedtime.   tamsulosin 0.4 MG Caps capsule Commonly known as: FLOMAX Take 0.4 mg by mouth daily.        Contact information for after-discharge care     [facility] .   Service: Skilled Nursing Contact information: [address] [place] [phone]                    Discharge Exam: Danley Danker Weights   03/13/22 0500 03/15/22 0418 03/16/22 0539  Weight: 75.9 kg 76.1 kg 68.2 kg      Physical Exam:   General:  AAO x 1, confused cooperative, no distress;   HEENT:  Normocephalic, PERRL, otherwise with in Normal limits   Neuro:  CNII-XII intact. , normal motor and sensation, reflexes intact   Lungs:   Clear to auscultation BL, Respirations unlabored,  No wheezes / crackles  Cardio:    S1/S2, RRR, No murmure, No Rubs or Gallops   Abdomen:  Soft, non-tender, bowel sounds active all four quadrants, no guarding or peritoneal signs.  Muscular  skeletal:  Limited exam -global generalized weaknesses - in bed, able to move all 4 extremities,   2+ pulses,  symmetric, No pitting edema  Skin:  Dry, warm to touch, negative for any Rashes,  Wounds: Please see nursing documentation  Pressure Injury 03/15/22 Coccyx Stage 1 -  Intact skin with non-blanchable redness of a localized area usually over a bony prominence. 6 x 6 non blanchable area skin intact (Active)  03/15/22 1315  Location: Coccyx   Location Orientation:   Staging: Stage 1 -  Intact skin with non-blanchable redness of a localized area usually over a bony prominence.  Wound Description (Comments): 6 x 6 non blanchable area skin intact  Present on Admission:           Condition at discharge: good  The results of significant diagnostics from this hospitalization (including imaging, microbiology, ancillary and laboratory) are listed below for reference.   Imaging Studies: DG CHEST PORT 1 VIEW  Result Date: 03/14/2022 CLINICAL DATA:  Dyspnea EXAM: PORTABLE CHEST 1 VIEW  COMPARISON:  Chest x-ray 03/04/2022 FINDINGS: Patient is status post cardiac surgery. Left-sided pacemaker is again seen. Cardiomediastinal silhouette is within normal limits. There is no lung consolidation, pleural effusion or pneumothorax. There is some chronic appearing linear opacities in the left lung base. No pleural effusion or pneumothorax. No acute fractures. There are healed left-sided rib fractures. IMPRESSION: No active disease. Chronic appearing linear opacities in the left lung base. Electronically Signed   By: Ronney Asters M.D.   On: 03/14/2022 18:30   CT HEAD WO CONTRAST (5MM)  Result Date: 03/07/2022 CLINICAL DATA:  Mental status change. Increased confusion and falls. EXAM: CT HEAD WITHOUT CONTRAST TECHNIQUE: Contiguous axial images were obtained from the base of the skull through the vertex without intravenous contrast. RADIATION DOSE REDUCTION: This exam was performed according to the departmental dose-optimization program which includes automated exposure control, adjustment of the mA and/or kV according to patient size and/or use of iterative reconstruction technique. COMPARISON:  CT head without contrast 03/04/2022. FINDINGS: Brain: Moderate generalized atrophy and white matter disease is stable. No acute infarct, hemorrhage, or mass lesion is present. The ventricles are proportionate to the degree of atrophy. No significant extraaxial  fluid collection is present. Basal ganglia are intact. No acute or focal cortical abnormality is present. The brainstem and cerebellum are within normal limits. Vascular: Atherosclerotic calcifications are present within the cavernous internal carotid arteries bilaterally and at the dural margin of both vertebral arteries. No hyperdense vessel is present. Skull: Calvarium is intact. No focal lytic or blastic lesions are present. No significant extracranial soft tissue lesion is present. Sinuses/Orbits: Mild mucosal thickening is present throughout the ethmoid air cells. No fluid levels are present. The paranasal sinuses and mastoid air cells are otherwise clear. Bilateral lens replacements are noted. Globes and orbits are otherwise unremarkable. IMPRESSION: 1. No acute intracranial abnormality or significant interval change. 2. Stable atrophy and white matter disease. This likely reflects the sequela of chronic microvascular ischemia. Electronically Signed   By: San Morelle M.D.   On: 03/07/2022 14:58   ECHOCARDIOGRAM COMPLETE  Result Date: 03/05/2022    ECHOCARDIOGRAM REPORT   Patient Name:   LEAF SELLS Date of Exam: 03/05/2022 Medical Rec #:  [id number]     Height:       67.0 in Accession #:    [id number]    Weight:       156.3 lb Date of Birth:  1932-03-22    BSA:          1.821 m Patient Age:    41 years      BP:           155/101 mmHg Patient Gender: M             HR:           63 bpm. Exam Location:  Inpatient Procedure: 2D Echo, 3D Echo, Cardiac Doppler and Color Doppler Indications:    CHF  History:        Patient has no prior history of Echocardiogram examinations.                 Pacemaker, Arrythmias:Complete Heart Block; Risk                 Factors:Diabetes, Dyslipidemia and Hypertension. COVID 19.  Sonographer:    Darlina Sicilian RDCS Referring Phys: [phone] Leanne Chang [facility]  Sonographer Comments: Suboptimal subcostal window. Echo performed with patient sitting upright in chair for  comfort. IMPRESSIONS  1. Hypokinesis of  the mid/distal inferoseptal wall. Left ventricular ejection fraction, by estimation, is 50 to 55%. The left ventricle has low normal function. There is severe asymmetric left ventricular hypertrophy. Left ventricular diastolic parameters are indeterminate.  2. Right ventricular systolic function is mildly reduced. The right ventricular size is normal. There is mildly elevated pulmonary artery systolic pressure.  3. Mild mitral valve regurgitation.  4. The aortic valve is tricuspid. Aortic valve regurgitation is mild. Aortic valve sclerosis is present, with no evidence of aortic valve stenosis. FINDINGS  Left Ventricle: Hypokinesis of the mid/distal inferoseptal wall. Left ventricular ejection fraction, by estimation, is 50 to 55%. The left ventricle has low normal function. The left ventricular internal cavity size was normal in size. There is severe asymmetric left ventricular hypertrophy. Left ventricular diastolic parameters are indeterminate. Right Ventricle: The right ventricular size is normal. Right vetricular wall thickness was not assessed. Right ventricular systolic function is mildly reduced. There is mildly elevated pulmonary artery systolic pressure. The tricuspid regurgitant velocity is 2.99 m/s, and with an assumed right atrial pressure of 8 mmHg, the estimated right ventricular systolic pressure is [id number] mmHg. Left Atrium: Left atrial size was normal in size. Right Atrium: Right atrial size was normal in size. Pericardium: There is no evidence of pericardial effusion. Mitral Valve: There is mild thickening of the mitral valve leaflet(s). Mild mitral annular calcification. Mild mitral valve regurgitation. Tricuspid Valve: The tricuspid valve is normal in structure. Tricuspid valve regurgitation is mild. Aortic Valve: The aortic valve is tricuspid. Aortic valve regurgitation is mild. Aortic valve sclerosis is present, with no evidence of aortic valve stenosis.  Pulmonic Valve: The pulmonic valve was normal in structure. Pulmonic valve regurgitation is mild. Aorta: The aortic root and ascending aorta are structurally normal, with no evidence of dilitation. Venous: The inferior vena cava was not well visualized. IAS/Shunts: No atrial level shunt detected by color flow Doppler. Additional Comments: A device lead is visualized.  LEFT VENTRICLE PLAX 2D LVIDd:         4.70 cm   Diastology LVIDs:         3.60 cm   LV e' medial:    3.64 cm/s LV PW:         0.80 cm   LV E/e' medial:  28.3 LV IVS:        1.70 cm   LV e' lateral:   7.83 cm/s LVOT diam:     1.80 cm   LV E/e' lateral: 13.2 LV SV:         42 LV SV Index:   23 LVOT Area:     2.54 cm                           3D Volume EF:                          3D EF:        52 %                          LV EDV:       132 ml                          LV ESV:       64 ml  LV SV:        68 ml RIGHT VENTRICLE RV S prime:     9.34 cm/s TAPSE (M-mode): 1.3 cm LEFT ATRIUM             Index        RIGHT ATRIUM           Index LA diam:        4.80 cm 2.64 cm/m   RA Area:     13.60 cm LA Vol (A2C):   56.9 ml 31.24 ml/m  RA Volume:   30.30 ml  16.64 ml/m LA Vol (A4C):   60.4 ml 33.17 ml/m LA Biplane Vol: 60.3 ml 33.11 ml/m  AORTIC VALVE             PULMONIC VALVE LVOT Vmax:   78.30 cm/s  PR End Diast Vel: 6.66 msec LVOT Vmean:  48.200 cm/s LVOT VTI:    0.167 m  AORTA Ao Root diam: 3.20 cm Ao Asc diam:  3.10 cm MITRAL VALVE                TRICUSPID VALVE MV Area (PHT): 4.83 cm     TR Peak grad:   35.8 mmHg MV Decel Time: 157 msec     TR Vmax:        299.00 cm/s MV E velocity: 103.00 cm/s MV A velocity: 99.80 cm/s   SHUNTS MV E/A ratio:  1.03         Systemic VTI:  0.17 m                             Systemic Diam: 1.80 cm Dorris Carnes MD Electronically signed by Dorris Carnes MD Signature Date/Time: 03/05/2022/5:07:38 PM    Final    US Venous Img Lower Unilateral Left (DVT)  Result Date: 03/05/2022 CLINICAL DATA:  Left  lower extremity swelling, shortness of breath EXAM: LEFT LOWER EXTREMITY VENOUS DOPPLER ULTRASOUND TECHNIQUE: Gray-scale sonography with compression, as well as color and duplex ultrasound, were performed to evaluate the deep venous system(s) from the level of the common femoral vein through the popliteal and proximal calf veins. COMPARISON:  None Available. FINDINGS: VENOUS Normal compressibility of the common femoral, superficial femoral, and popliteal veins, as well as the visualized calf veins. Visualized portions of profunda femoral vein and great saphenous vein unremarkable. No filling defects to suggest DVT on grayscale or color Doppler imaging. Doppler waveforms show normal direction of venous flow, normal respiratory plasticity and response to augmentation. Limited views of the contralateral common femoral vein are unremarkable. OTHER None. Limitations: none IMPRESSION: Negative. Electronically Signed   By: Jacqulynn Cadet M.D.   On: 03/05/2022 11:41   CT HEAD WO CONTRAST (5MM)  Result Date: 03/04/2022 CLINICAL DATA:  Initial evaluation for un witnessed mechanical fall. EXAM: CT HEAD WITHOUT CONTRAST TECHNIQUE: Contiguous axial images were obtained from the base of the skull through the vertex without intravenous contrast. RADIATION DOSE REDUCTION: This exam was performed according to the departmental dose-optimization program which includes automated exposure control, adjustment of the mA and/or kV according to patient size and/or use of iterative reconstruction technique. COMPARISON:  Prior study from 09/09/2018. FINDINGS: Brain: Examination degraded by motion artifact. Generalized age-related cerebral atrophy with chronic microvascular ischemic disease. No acute intracranial hemorrhage. No acute large vessel territory infarct. No mass lesion or midline shift. No hydrocephalus or extra-axial fluid collection. Vascular: No abnormal hyperdense vessel. Calcified atherosclerosis present at the  skull  base. Skull: No scalp soft tissue injury.  Calvarium intact. Sinuses/Orbits: Globes orbital soft tissues demonstrate no acute finding. Scattered mucosal thickening noted about the ethmoidal air cells. No mastoid effusion. Other: None. IMPRESSION: 1. No acute intracranial abnormality. 2. Generalized age-related cerebral atrophy with chronic microvascular ischemic disease. Electronically Signed   By: Jeannine Boga M.D.   On: 03/04/2022 21:34   DG Chest Port 1 View  Result Date: 03/04/2022 CLINICAL DATA:  Possible sepsis. Leg weakness yesterday after therapy. Lower extremity edema. EXAM: PORTABLE CHEST 1 VIEW COMPARISON:  Two-view chest x-ray 09/09/2018 FINDINGS: Heart size upper limits of normal. Atherosclerotic calcifications are present at the aorta. Patient is status post median sternotomy for CABG. Pacing wires are stable. Mild interstitial edema is present. Moderate pulmonary vascular congestion is noted. Airspace opacity is present along the minor fissure. And in the right lower lobe. Volume loss is evident on the right. No other focal airspace disease is present. Degenerative changes are present in the shoulders bilaterally. IMPRESSION: 1. Mild interstitial edema and moderate pulmonary vascular congestion suggesting congestive heart failure. 2. Airspace opacity along the minor fissure and in the right lower lobe likely reflects atelectasis. Infection is not excluded. Electronically Signed   By: San Morelle M.D.   On: 03/04/2022 13:16    Microbiology: Results for orders placed or performed during the hospital encounter of 03/04/22  Blood Culture (routine x 2)     Status: None   Collection Time: 03/04/22 12:44 PM   Specimen: BLOOD  Result Value Ref Range Status   Specimen Description BLOOD RIGHT WRIST  Final   Special Requests   Final    BOTTLES DRAWN AEROBIC AND ANAEROBIC Blood Culture adequate volume   Culture   Final    NO GROWTH 5 DAYS Performed at [facility], [address] [place], [place] [address]    Report Status 03/09/2022 FINAL  Final  SARS Coronavirus 2 by RT PCR (hospital order, performed in [facility] hospital lab) *cepheid single result test* Anterior Nasal Swab     Status: Abnormal   Collection Time: 03/04/22 12:58 PM   Specimen: Anterior Nasal Swab  Result Value Ref Range Status   SARS Coronavirus 2 by RT PCR POSITIVE (A) NEGATIVE Final    Comment: (NOTE) SARS-CoV-2 target nucleic acids are DETECTED  SARS-CoV-2 RNA is generally detectable in upper respiratory specimens  during the acute phase of infection.  Positive results are indicative  of the presence of the identified virus, but do not rule out bacterial infection or co-infection with other pathogens not detected by the test.  Clinical correlation with patient history and  other diagnostic information is necessary to determine patient infection status.  The expected result is negative.  Fact Sheet for Patients:   https://www.patel.info/   Fact Sheet for Healthcare Providers:   https://hall.com/    This test is not yet approved or cleared by the [place] FDA and  has been authorized for detection and/or diagnosis of SARS-CoV-2 by FDA under an Emergency Use Authorization (EUA).  This EUA will remain in effect (meaning this test can be used) for the duration of  the COVID-19 declaration under Section 564(b)(1)  of the Act, 21 U.S.C. section 360-bbb-3(b)(1), unless the authorization is terminated or revoked sooner.   Performed at [facility], [address] [place], [place] [address]   Blood Culture (routine x 2)     Status: None   Collection Time: 03/04/22  1:44 PM   Specimen: BLOOD  Result Value Ref Range Status   Specimen Description BLOOD LEFT WRIST  Final   Special Requests   Final    BOTTLES DRAWN AEROBIC AND ANAEROBIC Blood Culture results may not be optimal due to an excessive volume of blood received in culture bottles    Culture   Final    NO GROWTH 5 DAYS Performed at [facility], [address], [place], [place] [address]    Report Status 03/09/2022 FINAL  Final  Urine Culture     Status: Abnormal   Collection Time: 03/05/22 11:29 AM   Specimen: Urine, Clean Catch  Result Value Ref Range Status   Specimen Description   Final    URINE, CLEAN CATCH Performed at [facility], [address] [place], [place] [address]    Special Requests   Final    NONE Performed at [facility], [address], [place], [place] [address]    Culture MULTIPLE SPECIES PRESENT, SUGGEST RECOLLECTION (A)  Final   Report Status 03/06/2022 FINAL  Final    Labs: CBC: Recent Labs  Lab 03/11/22 0403  WBC 9.5  HGB 13.3  HCT 39.2  MCV 92.9  PLT [id number]   Basic Metabolic Panel: Recent Labs  Lab 03/11/22 0403 03/12/22 0659 03/13/22 0757 03/14/22 0507 03/15/22 0517 03/16/22 0528  NA 136 135 136 136 137 133*  K 3.3* 3.1* 3.3* 3.2* 3.4* 3.5  CL 102 100 99 100 101 98  CO2 27 30 28 26 27 29   GLUCOSE 162* 130* 137* 123* 89 116*  BUN 31* 33* 25* 24* 38* 42*  CREATININE 0.90 0.83 0.79 0.90 1.16 1.22  CALCIUM 9.5 9.3 9.7 9.8 9.9 9.6  PHOS 1.7*  --   --   --   --   --    Liver Function Tests: Recent Labs  Lab 03/11/22 0403  ALBUMIN 3.2*   CBG: Recent Labs  Lab 03/15/22 0744 03/15/22 1115 03/15/22 1625 03/15/22 2259 03/16/22 0721  GLUCAP 120* 133* 212* 156* 140*    Discharge time spent: greater than 30 minutes.  Signed: Deatra James, MD [facility] 03/16/2022

## 2022-03-16 NOTE — Care Management Important Message (Signed)
Important Message  Patient Details  Name: Guy Roberson MRN: [id number] Date of Birth: [birth year]   Medicare Important Message Given:  Yes     Tommy Medal 03/16/2022, 11:40 AM

## 2022-04-30 ENCOUNTER — Ambulatory Visit (INDEPENDENT_AMBULATORY_CARE_PROVIDER_SITE_OTHER): Payer: 59

## 2022-04-30 DIAGNOSIS — I442 Atrioventricular block, complete: Secondary | ICD-10-CM

## 2022-05-02 LAB — CUP PACEART REMOTE DEVICE CHECK
Battery Impedance: 2194 Ohm
Battery Remaining Longevity: 24 mo
Battery Voltage: 2.73 V
Brady Statistic AP VP Percent: 89 %
Brady Statistic AP VS Percent: 0 %
Brady Statistic AS VP Percent: 11 %
Brady Statistic AS VS Percent: 0 %
Date Time Interrogation Session: 20231201130654
Implantable Lead Connection Status: 753985
Implantable Lead Connection Status: 753985
Implantable Lead Implant Date: 20130730
Implantable Lead Implant Date: 20130730
Implantable Lead Location: 753859
Implantable Lead Location: 753860
Implantable Lead Model: 5076
Implantable Lead Model: 5092
Implantable Pulse Generator Implant Date: 20130730
Lead Channel Impedance Value: 443 Ohm
Lead Channel Impedance Value: 484 Ohm
Lead Channel Pacing Threshold Amplitude: 1 V
Lead Channel Pacing Threshold Amplitude: 1.375 V
Lead Channel Pacing Threshold Pulse Width: 0.4 ms
Lead Channel Pacing Threshold Pulse Width: 0.4 ms
Lead Channel Setting Pacing Amplitude: 2 V
Lead Channel Setting Pacing Amplitude: 2.75 V
Lead Channel Setting Pacing Pulse Width: 0.4 ms
Lead Channel Setting Sensing Sensitivity: 4 mV
Zone Setting Status: 755011
Zone Setting Status: 755011

## 2022-05-20 NOTE — Progress Notes (Signed)
Remote pacemaker transmission.   

## 2022-07-28 ENCOUNTER — Encounter: Payer: Self-pay | Admitting: Internal Medicine

## 2022-07-28 ENCOUNTER — Ambulatory Visit: Payer: 59 | Attending: Internal Medicine | Admitting: Internal Medicine

## 2022-07-28 VITALS — BP 88/42 | HR 51 | Ht 67.0 in | Wt 178.2 lb

## 2022-07-28 DIAGNOSIS — I10 Essential (primary) hypertension: Secondary | ICD-10-CM

## 2022-07-28 LAB — CUP PACEART INCLINIC DEVICE CHECK
Battery Impedance: 2959 Ohm
Battery Remaining Longevity: 22 mo
Battery Voltage: 2.72 V
Brady Statistic AP VP Percent: 92 %
Brady Statistic AP VS Percent: 0 %
Brady Statistic AS VP Percent: 8 %
Brady Statistic AS VS Percent: 0 %
Date Time Interrogation Session: 20240227175835
Implantable Lead Connection Status: 753985
Implantable Lead Connection Status: 753985
Implantable Lead Implant Date: 20130730
Implantable Lead Implant Date: 20130730
Implantable Lead Location: 753859
Implantable Lead Location: 753860
Implantable Lead Model: 5076
Implantable Lead Model: 5092
Implantable Pulse Generator Implant Date: 20130730
Lead Channel Impedance Value: 482 Ohm
Lead Channel Impedance Value: 525 Ohm
Lead Channel Pacing Threshold Amplitude: 1 V
Lead Channel Pacing Threshold Amplitude: 1.25 V
Lead Channel Pacing Threshold Pulse Width: 0.4 ms
Lead Channel Pacing Threshold Pulse Width: 0.4 ms
Lead Channel Setting Pacing Amplitude: 2.5 V
Lead Channel Setting Pacing Amplitude: 2.5 V
Lead Channel Setting Pacing Pulse Width: 0.4 ms
Lead Channel Setting Sensing Sensitivity: 4 mV
Zone Setting Status: 755011
Zone Setting Status: 755011

## 2022-07-28 MED ORDER — FUROSEMIDE 40 MG PO TABS: 40.0000 mg | ORAL_TABLET | Freq: Every day | ORAL | 11 refills | Status: DC

## 2022-07-28 MED ORDER — ALBUTEROL SULFATE HFA 108 (90 BASE) MCG/ACT IN AERS: 2.0000 | INHALATION_SPRAY | Freq: Four times a day (QID) | RESPIRATORY_TRACT | 2 refills | Status: DC | PRN

## 2022-07-28 NOTE — Progress Notes (Signed)
HPI Guy Roberson returns today for followup. He is a pleasant 89 yo man with a h/o [place] attacks and documented pauses of over 10 seconds. The patient also has a h/o BPH and is s/p prostatectomy with indwelling suprapubic catheter. No chest pain or sob and no syncope since his PPM was placed by Dr. Rayann Heman and revised by me. He does note pain in his lower extremities. He also notes weakness in his lower extremities. He is troubled over his wife who has fairly advanced dementia. He has had worsening sob. He feels weak.  Allergies  Allergen Reactions   Penicillins Other (See Comments)    Unknown reaction  Did it involve swelling of the face/tongue/throat, SOB, or low BP? Unknown Did it involve sudden or severe rash/hives, skin peeling, or any reaction on the inside of your mouth or nose? Unknown Did you need to seek medical attention at a hospital or doctor's office? Unknown When did it last happen?      childhood allergy If all above answers are "NO", may proceed with cephalosporin use.      Current Outpatient Medications  Medication Sig Dispense Refill   acetaminophen (TYLENOL) 650 MG CR tablet Take 650 mg by mouth every 8 (eight) hours as needed for pain or fever.     ALPRAZolam (XANAX) 0.5 MG tablet Take by mouth.     aspirin EC 81 MG tablet Take 1 tablet (81 mg total) by mouth every morning.     cetirizine (ZYRTEC) 10 MG tablet Take 10 mg by mouth daily as needed for allergies.     CONSTULOSE 10 GM/15ML solution Take 30 mLs by mouth 2 (two) times daily as needed for moderate constipation.      feeding supplement (ENSURE ENLIVE / ENSURE PLUS) LIQD Take 237 mLs by mouth 2 (two) times daily between meals. 237 mL 12   furosemide (LASIX) 20 MG tablet Take 1 tablet (20 mg total) by mouth daily. 30 tablet 0   hydrochlorothiazide (HYDRODIURIL) 25 MG tablet Take by mouth.     insulin aspart (NOVOLOG) 100 UNIT/ML injection Inject into the skin.     lisinopril (ZESTRIL) 10 MG tablet  Take 1 tablet (10 mg total) by mouth 2 (two) times daily. 60 tablet 0   Melatonin 3 MG CAPS Take 3 mg by mouth at bedtime.     metoprolol tartrate (LOPRESSOR) 50 MG tablet Take 50 mg by mouth 2 (two) times daily.     MYRBETRIQ 25 MG TB24 tablet Take 25 mg by mouth daily.     pantoprazole (PROTONIX) 20 MG tablet TAKE ONE TABLET ('20MG'$  TOTAL) BY MOUTH TWO TIMES DAILY BEFORE A MEAL (Patient taking differently: Take 20 mg by mouth 2 (two) times daily before a meal.) 90 tablet 1   Potassium Chloride ER 20 MEQ TBCR Take 1 tablet by mouth daily.     potassium chloride SA (K-DUR,KLOR-CON) 20 MEQ tablet Take 20 mEq by mouth every morning.      QUEtiapine (SEROQUEL) 50 MG tablet Take 1 tablet (50 mg total) by mouth at bedtime. 30 tablet 0   simvastatin (ZOCOR) 20 MG tablet Take 20 mg by mouth daily.     sulfamethoxazole-trimethoprim (BACTRIM DS) 800-160 MG tablet Take 1 tablet by mouth 2 (two) times daily.     SURE COMFORT PEN NEEDLES 31G X 5 MM MISC USE AS DIRECTED UP TO 3ITIMES DAILY.     tamsulosin (FLOMAX) 0.4 MG CAPS capsule Take 0.4 mg by mouth  daily.     amLODipine (NORVASC) 10 MG tablet Take 1 tablet (10 mg total) by mouth daily. 30 tablet 0   No current facility-administered medications for this visit.     Past Medical History:  Diagnosis Date   Arthritis    CAD (coronary artery disease)    a.  90% LAD stenosis in 1995 treated with PTCA;  b. 09/2009 CABG x 4: LIMA->LAD, VG->Diag, VG->OM, VG->PDA;  c. 11/2011 Cath: 3VD, 4/4 patent grafts, EF 70%.   Chronic constipation    Colonic polyp    Complete heart block ([place])    a. 11/2011 s/p MDT Adapta L ADDRL 1 Ser # [id number].   Complication of anesthesia    PROBLEMS VOIDING AFTER PREVIOUS SURGERIES   Diabetes mellitus without complication ([place])    NOT ON ANY DIABETIC MEDICATIONS   Fasting hyperglycemia    GERD (gastroesophageal reflux disease)    Hyperlipidemia    Hypertension    Pacemaker JULY 2013   DR.  G.Talal Fritchman AND DR. Clearnce Hasten    Pseudophakia of both eyes    Seizures ([place])    a. prior to diagnosis of heart block and syncope, this was in the differential and pt was briefly on Keppra, started by ER MD.   Syncope    a. 11/2011 18 second pauses noted on Event Monitor assoc w/ syncope   Trauma 1952   Abdominal and chest in 1952; multiple injuries including pelvic fracture, rib fractures and ruptured bladder   Urinary retention    PT HAS INDWELLING FOLEY CATHETER    ROS:   All systems reviewed and negative except as noted in the HPI.   Past Surgical History:  Procedure Laterality Date   BIOPSY  07/05/2019   Procedure: BIOPSY;  Surgeon: Rogene Houston, MD;  [facility];  Service: Endoscopy;;  gastric   BLADDER SURGERY  1950'S   CATARACT EXTRACTION W/PHACO Right 11/22/2012   Procedure: CATARACT EXTRACTION PHACO AND INTRAOCULAR LENS PLACEMENT ([place]);  Surgeon: Elta Guadeloupe T. Gershon Crane, MD;  Location: [facility];  Service: Ophthalmology;  Laterality: Right;  CDE 10.27   CATARACT EXTRACTION W/PHACO Left 12/20/2012   Procedure: CATARACT EXTRACTION PHACO AND INTRAOCULAR LENS PLACEMENT ([facility]);  Surgeon: Elta Guadeloupe T. Gershon Crane, MD;  Location: [facility];  Service: Ophthalmology;  Laterality: Left;  CDE:6.67   COLONOSCOPY W/ POLYPECTOMY     CORONARY ARTERY BYPASS GRAFT  09/2009   4 vessels   ESOPHAGEAL DILATION  07/05/2019   Procedure: ESOPHAGEAL DILATION;  Surgeon: Rogene Houston, MD;  [facility];  Service: Endoscopy;;   ESOPHAGOGASTRODUODENOSCOPY (EGD) WITH PROPOFOL N/A 07/05/2019   Procedure: ESOPHAGOGASTRODUODENOSCOPY (EGD) WITH PROPOFOL;  Surgeon: Rogene Houston, MD;  [facility];  Service: Endoscopy;  Laterality: N/A;  110-office notified pt new arrival time 10:45am   FEMORAL HERNIA REPAIR     INSERTION OF SUPRAPUBIC CATHETER  04/04/2012   Procedure: INSERTION OF SUPRAPUBIC CATHETER;  Surgeon: Claybon Jabs, MD;  [facility];  Service: Urology;  Laterality: N/A;   LEAD REVISION N/A 12/30/2011   Procedure:  LEAD REVISION;  Surgeon: Evans Lance, MD;  Location: [facility] CATH LAB;  Service: Cardiovascular;  Laterality: N/A;   LEFT HEART CATHETERIZATION WITH CORONARY/GRAFT ANGIOGRAM  12/29/2011   Procedure: LEFT HEART CATHETERIZATION WITH Beatrix Fetters;  Surgeon: Sherren Mocha, MD;  Location: [facility] CATH LAB;  Service: Cardiovascular;;   PACEMAKER PLACEMENT     TEMPORARY PACEMAKER INSERTION N/A 12/29/2011   Procedure: TEMPORARY PACEMAKER INSERTION;  Surgeon:  Sherren Mocha, MD;  Location: [facility] CATH LAB;  Service: Cardiovascular;  Laterality: N/A;   TRANSURETHRAL PROSTATECTOMY WITH GYRUS INSTRUMENTS  04/04/2012   Procedure: TRANSURETHRAL PROSTATECTOMY WITH GYRUS INSTRUMENTS;  Surgeon: Claybon Jabs, MD;  [facility];  Service: Urology;  Laterality: N/A;  TURP with Gyrus and Suprapubic Tube Placement   YAG LASER APPLICATION Left [id number]   Procedure: YAG LASER APPLICATION;  Surgeon: Rutherford Guys, MD;  Location: [facility];  Service: Ophthalmology;  Laterality: Left;     Family History  Problem Relation Age of Onset   Heart block Mother    Heart murmur Mother    Aneurysm Father    Coronary artery disease Brother        x3   Lung cancer Brother      Social History   Socioeconomic History   Marital status: Married    Spouse name: Not on file   Number of children: Not on file   Years of education: Not on file   Highest education level: Not on file  Occupational History   Occupation: Retired  Tobacco Use   Smoking status: Never   Smokeless tobacco: Current    Types: Nurse, children's Use: Never used  Substance and Sexual Activity   Alcohol use: No   Drug use: No   Sexual activity: Yes  Other Topics Concern   Not on file  Social History Narrative   Not on file   Social Determinants of Health   Financial Resource Strain: Not on file  Food Insecurity: No Food Insecurity (03/04/2022)   Hunger Vital Sign    Worried About Running Out of Food in the Last Year: Never true     Ran Out of Food in the Last Year: Never true  Transportation Needs: No Transportation Needs (03/04/2022)   PRAPARE - Hydrologist (Medical): No    Lack of Transportation (Non-Medical): No  Physical Activity: Not on file  Stress: Not on file  Social Connections: Not on file  Intimate Partner Violence: Not At Risk (03/04/2022)   Humiliation, Afraid, Rape, and Kick questionnaire    Fear of Current or Ex-Partner: No    Emotionally Abused: No    Physically Abused: No    Sexually Abused: No     BP (!) 88/42   Pulse (!) 51   Ht '5\' 7"'$  (1.702 m)   Wt 178 lb 3.2 oz (80.8 kg)   SpO2 93%   BMI 27.91 kg/m   Physical Exam:  Well appearing NAD HEENT: Unremarkable Neck:  No JVD, no thyromegally Lymphatics:  No adenopathy Back:  No CVA tenderness Lungs:  Clear HEART:  Regular rate rhythm, no murmurs, no rubs, no clicks Abd:  soft, positive bowel sounds, no organomegally, no rebound, no guarding Ext:  2 plus pulses, no edema, no cyanosis, no clubbing Skin:  No rashes no nodules Neuro:  CN II through XII intact, motor grossly intact   DEVICE  Normal device function.  See PaceArt for details.   Assess/Plan: 1. CHB - he is asymptomatic, s/p PPM insertion. 2. Sinus node dysfunction - he has no escape today. He is asymptomatic. 3. PPM - his medtronic DDD PM is working normally. We will recheck in several months 4. CHF - I have asked him to increase his lasix. 5. Low bp -I asked him to stop amlodipine.   Carleene Overlie Catrina Fellenz,MD

## 2022-07-28 NOTE — Patient Instructions (Addendum)
Medication Instructions:  Your physician has recommended you make the following change in your medication:   Stop Taking Amlodipine  Increase  Lasix to 40 mg Daily  Albuterol Take 2 Puffs every 6 hours as needed   *If you need a refill on your cardiac medications before your next appointment, please call your pharmacy*   Lab Work: Your physician recommends that you return for lab work in: 2 Weeks ( BMET)   If you have labs (blood work) drawn today and your tests are completely normal, you will receive your results only by: [employer] (if you have MyChart) OR A paper copy in the mail If you have any lab test that is abnormal or we need to change your treatment, we will call you to review the results.   Testing/Procedures: NONE    Follow-Up: At [facility], you and your health needs are our priority.  As part of our continuing mission to provide you with exceptional heart care, we have created designated Provider Care Teams.  These Care Teams include your primary Cardiologist (physician) and Advanced Practice Providers (APPs -  Physician Assistants and Nurse Practitioners) who all work together to provide you with the care you need, when you need it.  We recommend signing up for the patient portal called "MyChart".  Sign up information is provided on this After Visit Summary.  MyChart is used to connect with patients for Virtual Visits (Telemedicine).  Patients are able to view lab/test results, encounter notes, upcoming appointments, etc.  Non-urgent messages can be sent to your provider as well.   To learn more about what you can do with MyChart, go to [email or web address].    Your next appointment:   1 year(s)  Provider:   Cristopher Peru, MD    Other Instructions Thank you for choosing [facility]!

## 2022-07-30 ENCOUNTER — Ambulatory Visit (INDEPENDENT_AMBULATORY_CARE_PROVIDER_SITE_OTHER): Payer: 59

## 2022-07-30 DIAGNOSIS — I442 Atrioventricular block, complete: Secondary | ICD-10-CM | POA: Diagnosis not present

## 2022-07-30 LAB — CUP PACEART REMOTE DEVICE CHECK
Battery Impedance: 2419 Ohm
Battery Remaining Longevity: 23 mo
Battery Voltage: 2.72 V
Brady Statistic AP VP Percent: 100 %
Brady Statistic AP VS Percent: 0 %
Brady Statistic AS VP Percent: 0 %
Brady Statistic AS VS Percent: 0 %
Date Time Interrogation Session: 20240229080229
Implantable Lead Connection Status: 753985
Implantable Lead Connection Status: 753985
Implantable Lead Implant Date: 20130730
Implantable Lead Implant Date: 20130730
Implantable Lead Location: 753859
Implantable Lead Location: 753860
Implantable Lead Model: 5076
Implantable Lead Model: 5092
Implantable Pulse Generator Implant Date: 20130730
Lead Channel Impedance Value: 442 Ohm
Lead Channel Impedance Value: 483 Ohm
Lead Channel Setting Pacing Amplitude: 2.5 V
Lead Channel Setting Pacing Amplitude: 2.5 V
Lead Channel Setting Pacing Pulse Width: 0.4 ms
Lead Channel Setting Sensing Sensitivity: 4 mV
Zone Setting Status: 755011
Zone Setting Status: 755011

## 2022-08-04 ENCOUNTER — Ambulatory Visit (HOSPITAL_COMMUNITY)
Admission: RE | Admit: 2022-08-04 | Discharge: 2022-08-04 | Disposition: A | Discharge status: Home / Self Care | Payer: Medicare PPO | Source: Ambulatory Visit | Attending: Family Medicine | Admitting: Family Medicine

## 2022-08-04 ENCOUNTER — Other Ambulatory Visit (HOSPITAL_COMMUNITY): Payer: Self-pay | Admitting: Family Medicine

## 2022-08-04 ENCOUNTER — Other Ambulatory Visit (HOSPITAL_COMMUNITY)
Admission: RE | Admit: 2022-08-04 | Discharge: 2022-08-04 | Disposition: A | Discharge status: Home / Self Care | Payer: Medicare PPO | Source: Ambulatory Visit | Attending: Cardiology | Admitting: Cardiology

## 2022-08-04 ENCOUNTER — Other Ambulatory Visit (HOSPITAL_COMMUNITY)
Admission: RE | Admit: 2022-08-04 | Discharge: 2022-08-04 | Disposition: A | Discharge status: Home / Self Care | Payer: Medicare PPO | Source: Ambulatory Visit | Attending: Family Medicine | Admitting: Family Medicine

## 2022-08-04 DIAGNOSIS — I10 Essential (primary) hypertension: Secondary | ICD-10-CM | POA: Diagnosis present

## 2022-08-04 DIAGNOSIS — R5383 Other fatigue: Secondary | ICD-10-CM | POA: Insufficient documentation

## 2022-08-04 DIAGNOSIS — R062 Wheezing: Secondary | ICD-10-CM | POA: Diagnosis present

## 2022-08-04 DIAGNOSIS — R609 Edema, unspecified: Secondary | ICD-10-CM | POA: Insufficient documentation

## 2022-08-04 DIAGNOSIS — R7989 Other specified abnormal findings of blood chemistry: Secondary | ICD-10-CM | POA: Diagnosis present

## 2022-08-04 LAB — BASIC METABOLIC PANEL
Anion gap: 8 (ref 5–15)
BUN: 42 mg/dL — ABNORMAL HIGH (ref 8–23)
CO2: 26 mmol/L (ref 22–32)
Calcium: 9.8 mg/dL (ref 8.9–10.3)
Chloride: 98 mmol/L (ref 98–111)
Creatinine, Ser: 1.82 mg/dL — ABNORMAL HIGH (ref 0.61–1.24)
GFR, Estimated: 35 mL/min — ABNORMAL LOW (ref >60)
Glucose, Bld: 115 mg/dL — ABNORMAL HIGH (ref 70–99)
Potassium: 3.9 mmol/L (ref 3.5–5.1)
Sodium: 132 mmol/L — ABNORMAL LOW (ref 135–145)

## 2022-08-04 LAB — CBC
HCT: 37 % — ABNORMAL LOW (ref 39.0–52.0)
Hemoglobin: 12.1 g/dL — ABNORMAL LOW (ref 13.0–17.0)
MCH: 29.2 pg (ref 26.0–34.0)
MCHC: 32.7 g/dL (ref 30.0–36.0)
MCV: 89.2 fL (ref 80.0–100.0)
Platelets: 145 10*3/uL — ABNORMAL LOW (ref 150–400)
RBC: 4.15 MIL/uL — ABNORMAL LOW (ref 4.22–5.81)
RDW: 14.6 % (ref 11.5–15.5)
WBC: 7 10*3/uL (ref 4.0–10.5)
nRBC: 0 % (ref 0.0–0.2)

## 2022-08-04 LAB — BRAIN NATRIURETIC PEPTIDE: B Natriuretic Peptide: 947 pg/mL — ABNORMAL HIGH (ref 0.0–100.0)

## 2022-08-05 ENCOUNTER — Telehealth: Payer: Self-pay

## 2022-08-05 MED ORDER — FUROSEMIDE 40 MG PO TABS: 40.0000 mg | ORAL_TABLET | ORAL | 11 refills | Status: DC

## 2022-08-05 NOTE — Telephone Encounter (Signed)
Patient notified and verbalized understanding. Patient had no questions or concerns at this time. 

## 2022-08-05 NOTE — Telephone Encounter (Signed)
-----   Message from Evans Lance, MD sent at 08/04/2022  9:58 PM EST ----- Ask him to decrease the lasix to every other day.

## 2022-08-09 NOTE — Progress Notes (Deleted)
Cardiology Office Note Date:  08/09/2022  Patient ID:  Guy Roberson, Guy Roberson 23-Jun-1931, MRN [id number] PCP:  [name], MD  Electrophysiologist: Dr. Lovena Le  ***refresh   Chief Complaint: *** ? post hospital  History of Present Illness: Guy Roberson is a 89 y.o. male with history of HTN, DM, CHB/pauses w/PPM  Admitted 03/04/22 with progressive weakness > fall found with COVID and HFpEF exacerbation Echo with preserved EF of 50 to 55% with severe asymmetric LVH and mild pulmonary hypertension  Diuresed, meds adjusted and hi home HCTZ stopped, empirically treated for UTI with abn UA Had some hospital delirium/agitation D/c to SNF with persistent weakness/functional decline on 03/16/22  He saw Dr. Lovena Le 07/28/22, personal stress with his wife's advancing dimentia, also reported sone SOB and general weakness He had no underlying escape rhythm on device check, device functioning normally His lasix up-titrated Amlodipine stopped with low BP  08/05/22: phone note to decrease his lasix to QOD  *** ? Another hospitalization???? *** volume *** lasix labs, why the dose reduction *** DEPENDENT  Device information MDT dual chamber PPM implanted 12/29/2011   Past Medical History:  Diagnosis Date   Arthritis    CAD (coronary artery disease)    a.  90% LAD stenosis in 1995 treated with PTCA;  b. 09/2009 CABG x 4: LIMA->LAD, VG->Diag, VG->OM, VG->PDA;  c. 11/2011 Cath: 3VD, 4/4 patent grafts, EF 70%.   Chronic constipation    Colonic polyp    Complete heart block ([place])    a. 11/2011 s/p MDT Adapta L ADDRL 1 Ser # [id number].   Complication of anesthesia    PROBLEMS VOIDING AFTER PREVIOUS SURGERIES   Diabetes mellitus without complication ([place])    NOT ON ANY DIABETIC MEDICATIONS   Fasting hyperglycemia    GERD (gastroesophageal reflux disease)    Hyperlipidemia    Hypertension    Pacemaker JULY 2013   DR.  G.TAYLOR AND DR. Clearnce Hasten   Pseudophakia of both eyes    Seizures ([place])     a. prior to diagnosis of heart block and syncope, this was in the differential and pt was briefly on Keppra, started by ER MD.   Syncope    a. 11/2011 18 second pauses noted on Event Monitor assoc w/ syncope   Trauma 1952   Abdominal and chest in 1952; multiple injuries including pelvic fracture, rib fractures and ruptured bladder   Urinary retention    PT HAS INDWELLING FOLEY CATHETER    Past Surgical History:  Procedure Laterality Date   BIOPSY  07/05/2019   Procedure: BIOPSY;  Surgeon: Rogene Houston, MD;  [facility];  Service: Endoscopy;;  gastric   BLADDER SURGERY  1950'S   CATARACT EXTRACTION W/PHACO Right 11/22/2012   Procedure: CATARACT EXTRACTION PHACO AND INTRAOCULAR LENS PLACEMENT ([place]);  Surgeon: Elta Guadeloupe T. Gershon Crane, MD;  Location: [facility];  Service: Ophthalmology;  Laterality: Right;  CDE 10.27   CATARACT EXTRACTION W/PHACO Left 12/20/2012   Procedure: CATARACT EXTRACTION PHACO AND INTRAOCULAR LENS PLACEMENT ([facility]);  Surgeon: Elta Guadeloupe T. Gershon Crane, MD;  Location: [facility];  Service: Ophthalmology;  Laterality: Left;  CDE:6.67   COLONOSCOPY W/ POLYPECTOMY     CORONARY ARTERY BYPASS GRAFT  09/2009   4 vessels   ESOPHAGEAL DILATION  07/05/2019   Procedure: ESOPHAGEAL DILATION;  Surgeon: Rogene Houston, MD;  [facility];  Service: Endoscopy;;   ESOPHAGOGASTRODUODENOSCOPY (EGD) WITH PROPOFOL N/A 07/05/2019   Procedure: ESOPHAGOGASTRODUODENOSCOPY (EGD) WITH PROPOFOL;  Surgeon: Rogene Houston, MD;  [facility];  Service: Endoscopy;  Laterality: N/A;  110-office notified pt new arrival time 10:45am   FEMORAL HERNIA REPAIR     INSERTION OF SUPRAPUBIC CATHETER  04/04/2012   Procedure: INSERTION OF SUPRAPUBIC CATHETER;  Surgeon: Claybon Jabs, MD;  [facility];  Service: Urology;  Laterality: N/A;   LEAD REVISION N/A 12/30/2011   Procedure: LEAD REVISION;  Surgeon: Evans Lance, MD;  Location: [facility] CATH LAB;  Service: Cardiovascular;  Laterality: N/A;   LEFT  HEART CATHETERIZATION WITH CORONARY/GRAFT ANGIOGRAM  12/29/2011   Procedure: LEFT HEART CATHETERIZATION WITH Beatrix Fetters;  Surgeon: Sherren Mocha, MD;  Location: [facility] CATH LAB;  Service: Cardiovascular;;   PACEMAKER PLACEMENT     TEMPORARY PACEMAKER INSERTION N/A 12/29/2011   Procedure: TEMPORARY PACEMAKER INSERTION;  Surgeon: Sherren Mocha, MD;  Location: [facility] CATH LAB;  Service: Cardiovascular;  Laterality: N/A;   TRANSURETHRAL PROSTATECTOMY WITH GYRUS INSTRUMENTS  04/04/2012   Procedure: TRANSURETHRAL PROSTATECTOMY WITH GYRUS INSTRUMENTS;  Surgeon: Claybon Jabs, MD;  [facility];  Service: Urology;  Laterality: N/A;  TURP with Gyrus and Suprapubic Tube Placement   YAG LASER APPLICATION Left [id number]   Procedure: YAG LASER APPLICATION;  Surgeon: Rutherford Guys, MD;  Location: [facility];  Service: Ophthalmology;  Laterality: Left;    Current Outpatient Medications  Medication Sig Dispense Refill   acetaminophen (TYLENOL) 650 MG CR tablet Take 650 mg by mouth every 8 (eight) hours as needed for pain or fever.     albuterol (VENTOLIN HFA) 108 (90 Base) MCG/ACT inhaler Inhale 2 puffs into the lungs every 6 (six) hours as needed for wheezing or shortness of breath. 8 g 2   ALPRAZolam (XANAX) 0.5 MG tablet Take by mouth.     aspirin EC 81 MG tablet Take 1 tablet (81 mg total) by mouth every morning.     cetirizine (ZYRTEC) 10 MG tablet Take 10 mg by mouth daily as needed for allergies.     CONSTULOSE 10 GM/15ML solution Take 30 mLs by mouth 2 (two) times daily as needed for moderate constipation.      feeding supplement (ENSURE ENLIVE / ENSURE PLUS) LIQD Take 237 mLs by mouth 2 (two) times daily between meals. 237 mL 12   furosemide (LASIX) 40 MG tablet Take 1 tablet (40 mg total) by mouth every other day. 15 tablet 11   hydrochlorothiazide (HYDRODIURIL) 25 MG tablet Take by mouth.     insulin aspart (NOVOLOG) 100 UNIT/ML injection Inject into the skin.     lisinopril (ZESTRIL) 10 MG  tablet Take 1 tablet (10 mg total) by mouth 2 (two) times daily. 60 tablet 0   Melatonin 3 MG CAPS Take 3 mg by mouth at bedtime.     metoprolol tartrate (LOPRESSOR) 50 MG tablet Take 50 mg by mouth 2 (two) times daily.     MYRBETRIQ 25 MG TB24 tablet Take 25 mg by mouth daily.     pantoprazole (PROTONIX) 20 MG tablet TAKE ONE TABLET ('20MG'$  TOTAL) BY MOUTH TWO TIMES DAILY BEFORE A MEAL (Patient taking differently: Take 20 mg by mouth 2 (two) times daily before a meal.) 90 tablet 1   Potassium Chloride ER 20 MEQ TBCR Take 1 tablet by mouth daily.     potassium chloride SA (K-DUR,KLOR-CON) 20 MEQ tablet Take 20 mEq by mouth every morning.      QUEtiapine (SEROQUEL) 50 MG tablet Take 1 tablet (50 mg total) by mouth at bedtime.  30 tablet 0   simvastatin (ZOCOR) 20 MG tablet Take 20 mg by mouth daily.     sulfamethoxazole-trimethoprim (BACTRIM DS) 800-160 MG tablet Take 1 tablet by mouth 2 (two) times daily.     SURE COMFORT PEN NEEDLES 31G X 5 MM MISC USE AS DIRECTED UP TO 3ITIMES DAILY.     tamsulosin (FLOMAX) 0.4 MG CAPS capsule Take 0.4 mg by mouth daily.     No current facility-administered medications for this visit.    Allergies:   Penicillins   Social History:  The patient  reports that he has never smoked. His smokeless tobacco use includes chew. He reports that he does not drink alcohol and does not use drugs.   Family History:  The patient's family history includes Aneurysm in his father; Coronary artery disease in his brother; Heart block in his mother; Heart murmur in his mother; Lung cancer in his brother.  ROS:  Please see the history of present illness.    All other systems are reviewed and otherwise negative.   PHYSICAL EXAM:  VS:  There were no vitals taken for this visit. BMI: There is no height or weight on file to calculate BMI. Well nourished, well developed, in no acute distress HEENT: normocephalic, atraumatic Neck: no JVD, carotid bruits or masses Cardiac:  *** RRR;  no significant murmurs, no rubs, or gallops Lungs:  *** CTA b/l, no wheezing, rhonchi or rales Abd: soft, nontender MS: no deformity or *** atrophy Ext: *** no edema Skin: warm and dry, no rash Neuro:  No gross deficits appreciated Psych: euthymic mood, full affect  *** PPM site is stable, no tethering or discomfort   EKG:  not done today  Device interrogation done today and reviewed by myself:  ***   03/05/2022: TTE 1. Hypokinesis of the mid/distal inferoseptal wall. Left ventricular  ejection fraction, by estimation, is 50 to 55%. The left ventricle has low  normal function. There is severe asymmetric left ventricular hypertrophy.  Left ventricular diastolic  parameters are indeterminate.   2. Right ventricular systolic function is mildly reduced. The right  ventricular size is normal. There is mildly elevated pulmonary artery  systolic pressure.   3. Mild mitral valve regurgitation.   4. The aortic valve is tricuspid. Aortic valve regurgitation is mild.  Aortic valve sclerosis is present, with no evidence of aortic valve  stenosis.   Recent Labs: 03/08/2022: Magnesium 2.2 03/09/2022: ALT 39 08/04/2022: B Natriuretic Peptide 947.0; BUN 42; Creatinine, Ser 1.82; Hemoglobin 12.1; Platelets 145; Potassium 3.9; Sodium 132  No results found for requested labs within last 365 days.   Estimated Creatinine Clearance: 28.6 mL/min (A) (by C-G formula based on SCr of 1.82 mg/dL (H)).   Wt Readings from Last 3 Encounters:  07/28/22 178 lb 3.2 oz (80.8 kg)  03/16/22 150 lb 5.7 oz (68.2 kg)  07/16/21 151 lb (68.5 kg)     Other studies reviewed: Additional studies/records reviewed today include: summarized above  ASSESSMENT AND PLAN:  PPM ***  Chronic CHF  HFpEF ***   HTN ***    Disposition: F/u with ***  Current medicines are reviewed at length with the patient today.  The patient did not have any concerns regarding medicines.  Venetia Night, PA-C 08/09/2022  10:10 AM     [facility] [address] [place] [address] [phone] (office)  [phone] (fax)

## 2022-08-10 ENCOUNTER — Ambulatory Visit: Payer: 59 | Admitting: Physician Assistant

## 2022-08-11 ENCOUNTER — Emergency Department (HOSPITAL_COMMUNITY): Payer: Medicare PPO

## 2022-08-11 ENCOUNTER — Inpatient Hospital Stay (HOSPITAL_COMMUNITY)
Admission: EM | Admit: 2022-08-11 | Discharge: 2022-08-14 | DRG: 291 | Disposition: A | Discharge status: Skilled Nursing Facility | Payer: Medicare PPO | Attending: Family Medicine | Admitting: Family Medicine

## 2022-08-11 ENCOUNTER — Encounter (HOSPITAL_COMMUNITY): Payer: Self-pay

## 2022-08-11 ENCOUNTER — Other Ambulatory Visit: Payer: Self-pay

## 2022-08-11 DIAGNOSIS — K219 Gastro-esophageal reflux disease without esophagitis: Secondary | ICD-10-CM | POA: Insufficient documentation

## 2022-08-11 DIAGNOSIS — W19XXXA Unspecified fall, initial encounter: Secondary | ICD-10-CM

## 2022-08-11 DIAGNOSIS — I110 Hypertensive heart disease with heart failure: Secondary | ICD-10-CM | POA: Diagnosis present

## 2022-08-11 DIAGNOSIS — W1830XA Fall on same level, unspecified, initial encounter: Secondary | ICD-10-CM | POA: Diagnosis present

## 2022-08-11 DIAGNOSIS — E782 Mixed hyperlipidemia: Secondary | ICD-10-CM | POA: Diagnosis present

## 2022-08-11 DIAGNOSIS — M19022 Primary osteoarthritis, left elbow: Secondary | ICD-10-CM | POA: Diagnosis present

## 2022-08-11 DIAGNOSIS — I509 Heart failure, unspecified: Secondary | ICD-10-CM

## 2022-08-11 DIAGNOSIS — Z9842 Cataract extraction status, left eye: Secondary | ICD-10-CM | POA: Diagnosis not present

## 2022-08-11 DIAGNOSIS — I2510 Atherosclerotic heart disease of native coronary artery without angina pectoris: Secondary | ICD-10-CM | POA: Diagnosis not present

## 2022-08-11 DIAGNOSIS — Z8616 Personal history of COVID-19: Secondary | ICD-10-CM | POA: Diagnosis not present

## 2022-08-11 DIAGNOSIS — I5033 Acute on chronic diastolic (congestive) heart failure: Secondary | ICD-10-CM | POA: Diagnosis present

## 2022-08-11 DIAGNOSIS — J9601 Acute respiratory failure with hypoxia: Secondary | ICD-10-CM | POA: Diagnosis not present

## 2022-08-11 DIAGNOSIS — Z9861 Coronary angioplasty status: Secondary | ICD-10-CM

## 2022-08-11 DIAGNOSIS — Z720 Tobacco use: Secondary | ICD-10-CM

## 2022-08-11 DIAGNOSIS — E119 Type 2 diabetes mellitus without complications: Secondary | ICD-10-CM | POA: Diagnosis not present

## 2022-08-11 DIAGNOSIS — Y92009 Unspecified place in unspecified non-institutional (private) residence as the place of occurrence of the external cause: Secondary | ICD-10-CM | POA: Diagnosis not present

## 2022-08-11 DIAGNOSIS — Z8249 Family history of ischemic heart disease and other diseases of the circulatory system: Secondary | ICD-10-CM

## 2022-08-11 DIAGNOSIS — N179 Acute kidney failure, unspecified: Secondary | ICD-10-CM | POA: Diagnosis present

## 2022-08-11 DIAGNOSIS — Z880 Allergy status to penicillin: Secondary | ICD-10-CM

## 2022-08-11 DIAGNOSIS — Z950 Presence of cardiac pacemaker: Secondary | ICD-10-CM | POA: Diagnosis not present

## 2022-08-11 DIAGNOSIS — E1159 Type 2 diabetes mellitus with other circulatory complications: Secondary | ICD-10-CM

## 2022-08-11 DIAGNOSIS — E876 Hypokalemia: Secondary | ICD-10-CM | POA: Diagnosis not present

## 2022-08-11 DIAGNOSIS — R296 Repeated falls: Secondary | ICD-10-CM | POA: Diagnosis present

## 2022-08-11 DIAGNOSIS — Z79899 Other long term (current) drug therapy: Secondary | ICD-10-CM | POA: Diagnosis not present

## 2022-08-11 DIAGNOSIS — Z66 Do not resuscitate: Secondary | ICD-10-CM | POA: Diagnosis present

## 2022-08-11 DIAGNOSIS — I10 Essential (primary) hypertension: Secondary | ICD-10-CM | POA: Diagnosis not present

## 2022-08-11 DIAGNOSIS — N400 Enlarged prostate without lower urinary tract symptoms: Secondary | ICD-10-CM | POA: Diagnosis present

## 2022-08-11 DIAGNOSIS — Z7982 Long term (current) use of aspirin: Secondary | ICD-10-CM

## 2022-08-11 DIAGNOSIS — F39 Unspecified mood [affective] disorder: Secondary | ICD-10-CM | POA: Diagnosis present

## 2022-08-11 DIAGNOSIS — Z961 Presence of intraocular lens: Secondary | ICD-10-CM | POA: Diagnosis present

## 2022-08-11 DIAGNOSIS — I442 Atrioventricular block, complete: Secondary | ICD-10-CM | POA: Diagnosis not present

## 2022-08-11 DIAGNOSIS — G931 Anoxic brain damage, not elsewhere classified: Secondary | ICD-10-CM | POA: Diagnosis not present

## 2022-08-11 DIAGNOSIS — S40022A Contusion of left upper arm, initial encounter: Secondary | ICD-10-CM | POA: Diagnosis not present

## 2022-08-11 DIAGNOSIS — Z9841 Cataract extraction status, right eye: Secondary | ICD-10-CM

## 2022-08-11 DIAGNOSIS — S5012XA Contusion of left forearm, initial encounter: Secondary | ICD-10-CM | POA: Diagnosis present

## 2022-08-11 DIAGNOSIS — Z951 Presence of aortocoronary bypass graft: Secondary | ICD-10-CM | POA: Diagnosis not present

## 2022-08-11 DIAGNOSIS — I5031 Acute diastolic (congestive) heart failure: Secondary | ICD-10-CM | POA: Diagnosis not present

## 2022-08-11 LAB — RAPID URINE DRUG SCREEN, HOSP PERFORMED
Amphetamines: NOT DETECTED
Barbiturates: NOT DETECTED
Benzodiazepines: NOT DETECTED
Cocaine: NOT DETECTED
Opiates: NOT DETECTED
Tetrahydrocannabinol: NOT DETECTED

## 2022-08-11 LAB — CBC WITH DIFFERENTIAL/PLATELET
Abs Immature Granulocytes: 0.04 10*3/uL (ref 0.00–0.07)
Basophils Absolute: 0 10*3/uL (ref 0.0–0.1)
Basophils Relative: 0 %
Eosinophils Absolute: 0.1 10*3/uL (ref 0.0–0.5)
Eosinophils Relative: 1 %
HCT: 40.5 % (ref 39.0–52.0)
Hemoglobin: 13.3 g/dL (ref 13.0–17.0)
Immature Granulocytes: 0 %
Lymphocytes Relative: 17 %
Lymphs Abs: 1.6 10*3/uL (ref 0.7–4.0)
MCH: 29.2 pg (ref 26.0–34.0)
MCHC: 32.8 g/dL (ref 30.0–36.0)
MCV: 89 fL (ref 80.0–100.0)
Monocytes Absolute: 1 10*3/uL (ref 0.1–1.0)
Monocytes Relative: 11 %
Neutro Abs: 6.5 10*3/uL (ref 1.7–7.7)
Neutrophils Relative %: 71 %
Platelets: 187 10*3/uL (ref 150–400)
RBC: 4.55 MIL/uL (ref 4.22–5.81)
RDW: 14.7 % (ref 11.5–15.5)
WBC: 9.2 10*3/uL (ref 4.0–10.5)
nRBC: 0 % (ref 0.0–0.2)

## 2022-08-11 LAB — URINALYSIS, W/ REFLEX TO CULTURE (INFECTION SUSPECTED)
Bilirubin Urine: NEGATIVE
Glucose, UA: NEGATIVE mg/dL
Ketones, ur: NEGATIVE mg/dL
Leukocytes,Ua: NEGATIVE
Nitrite: NEGATIVE
Specific Gravity, Urine: 1.01 (ref 1.005–1.030)
pH: 6 (ref 5.0–8.0)

## 2022-08-11 LAB — TROPONIN I (HIGH SENSITIVITY)
Troponin I (High Sensitivity): 16 ng/L (ref <18)
Troponin I (High Sensitivity): 16 ng/L (ref <18)

## 2022-08-11 LAB — AMMONIA: Ammonia: 15 umol/L (ref 9–35)

## 2022-08-11 LAB — TSH: TSH: 1.728 u[IU]/mL (ref 0.350–4.500)

## 2022-08-11 LAB — COMPREHENSIVE METABOLIC PANEL
ALT: 26 U/L (ref 0–44)
AST: 27 U/L (ref 15–41)
Albumin: 4.3 g/dL (ref 3.5–5.0)
Alkaline Phosphatase: 84 U/L (ref 38–126)
Anion gap: 11 (ref 5–15)
BUN: 25 mg/dL — ABNORMAL HIGH (ref 8–23)
CO2: 26 mmol/L (ref 22–32)
Calcium: 9.9 mg/dL (ref 8.9–10.3)
Chloride: 95 mmol/L — ABNORMAL LOW (ref 98–111)
Creatinine, Ser: 1.31 mg/dL — ABNORMAL HIGH (ref 0.61–1.24)
GFR, Estimated: 52 mL/min — ABNORMAL LOW (ref >60)
Glucose, Bld: 123 mg/dL — ABNORMAL HIGH (ref 70–99)
Potassium: 4 mmol/L (ref 3.5–5.1)
Sodium: 132 mmol/L — ABNORMAL LOW (ref 135–145)
Total Bilirubin: 1.4 mg/dL — ABNORMAL HIGH (ref 0.3–1.2)
Total Protein: 7.4 g/dL (ref 6.5–8.1)

## 2022-08-11 LAB — MAGNESIUM: Magnesium: 1.8 mg/dL (ref 1.7–2.4)

## 2022-08-11 LAB — BRAIN NATRIURETIC PEPTIDE: B Natriuretic Peptide: 1554 pg/mL — ABNORMAL HIGH (ref 0.0–100.0)

## 2022-08-11 MED ORDER — FUROSEMIDE 10 MG/ML IJ SOLN
40.0000 mg | Freq: Once | INTRAMUSCULAR | Status: AC
  Administered 2022-08-11: 40 mg via INTRAVENOUS
  Filled 2022-08-11: qty 4

## 2022-08-11 NOTE — ED Notes (Signed)
Patient placed on 2 LPM nasal cannula

## 2022-08-11 NOTE — ED Triage Notes (Signed)
Patients family reports patient has been seeing things and doing things that are abnormal.  Has been going back and forth to the MD getting blood work.  Family reports was suppose to be called last week to come to hospital due to elevated BNP and congestive heart failure.  Patient had two falls today out of wheelchair. Patient has large hematoma and bruising to left arm.  Patient is not on blood thinners. BNP on 3/5 >900

## 2022-08-11 NOTE — H&P (Incomplete)
History and Physical    Patient: Guy Roberson [id number] DOB: 10/08/1931 DOA: 08/11/2022 DOS: the patient was seen and examined on 08/11/2022 PCP: [name], MD  Patient coming from: Home  Chief Complaint:  Chief Complaint  Patient presents with  . Fall  . Hallucinations  . Shortness of Breath   HPI: Guy Roberson is a 89 y.o. male with medical history significant of hypertension, hyperlipidemia, CAD s/p CABG, complete heart block status post pacemaker placement, GERD, T2DM who presents to the emergency department due to confusion.  Patient was unable to provide history, history was obtained from ED physician and ED medical record.  Per report, patient was reported to have had intermittent confusion within the last few weeks.  His dose of diuretic was recently decreased and patient has since started to develop worsening shortness of breath to the extent that he has started to use wife's oxygen at home, shortness of breath worsens when laying flat in bed.  Patient was recently started on sulfa antibiotics due to concern for UTI, but this was stopped after the onset of confusion since it was suspected that the sulfa antibiotic may have been contributing to patient's confusion.  Patient was reported to have been unsteady and sustained falls few days ago whereby he hit both elbows and the most recent fall being today whereby he fell and landed on his left forearm.  He denied head trauma, chest pain, fever, chills.  ED Course:  In the emergency department, blood pressure was 177/70, but other vital signs were within normal range.  Workup in the ED showed normal CBC, BMP showed sodium 132, potassium 4.0, chloride 95, bicarb 26, blood glucose 123, BUN/creatinine 25/1.31 eGFR 52.  BNP 1554, this was 947 on 08/04/2022, TSH 1.728, urinalysis was unimpressive for UTI, urine drug screen was normal, troponin x 2 was flat at 16, ammonia 15, magnesium 1.8. CT head without contrast showed no acute  intracranial abnormality Chest x-ray showed moderate bilateral interstitial thickening which is similar to prior and may be due to pulmonary edema.  Mild right pleural effusion, slightly increased from 08/04/22 Left elbow x-ray showed no acute fracture of the left elbow or forearm.  Mild elbow osteoarthritis IV Lasix 40 mg x 1 was given.  Hospitalist was asked to admit patient for further evaluation and management.   Review of Systems: Review of systems as noted in the HPI. All other systems reviewed and are negative.   Past Medical History:  Diagnosis Date  . Arthritis   . CAD (coronary artery disease)    a.  90% LAD stenosis in 1995 treated with PTCA;  b. 09/2009 CABG x 4: LIMA->LAD, VG->Diag, VG->OM, VG->PDA;  c. 11/2011 Cath: 3VD, 4/4 patent grafts, EF 70%.  . Chronic constipation   . Colonic polyp   . Complete heart block ([place])    a. 11/2011 s/p MDT Adapta L ADDRL 1 Ser # [id number] H.  . Complication of anesthesia    PROBLEMS VOIDING AFTER PREVIOUS SURGERIES  . Diabetes mellitus without complication ([place])    NOT ON ANY DIABETIC MEDICATIONS  . Fasting hyperglycemia   . GERD (gastroesophageal reflux disease)   . Hyperlipidemia   . Hypertension   . Pacemaker JULY 2013   DR.  G.TAYLOR AND DR. Clearnce Hasten  . Pseudophakia of both eyes   . Seizures ([place])    a. prior to diagnosis of heart block and syncope, this was in the differential and pt was briefly on Keppra, started by ER MD.  .  Syncope    a. 11/2011 18 second pauses noted on Event Monitor assoc w/ syncope  . Trauma 1952   Abdominal and chest in 1952; multiple injuries including pelvic fracture, rib fractures and ruptured bladder  . Urinary retention    PT HAS INDWELLING FOLEY CATHETER   Past Surgical History:  Procedure Laterality Date  . BIOPSY  07/05/2019   Procedure: BIOPSY;  Surgeon: Rogene Houston, MD;  [facility];  Service: Endoscopy;;  gastric  . BLADDER SURGERY  1950'S  . CATARACT EXTRACTION W/PHACO  Right 11/22/2012   Procedure: CATARACT EXTRACTION PHACO AND INTRAOCULAR LENS PLACEMENT (IOC);  Surgeon: Elta Guadeloupe T. Gershon Crane, MD;  Location: [facility];  Service: Ophthalmology;  Laterality: Right;  CDE 10.27  . CATARACT EXTRACTION W/PHACO Left 12/20/2012   Procedure: CATARACT EXTRACTION PHACO AND INTRAOCULAR LENS PLACEMENT (IOC);  Surgeon: Elta Guadeloupe T. Gershon Crane, MD;  Location: [facility];  Service: Ophthalmology;  Laterality: Left;  CDE:6.67  . COLONOSCOPY W/ POLYPECTOMY    . CORONARY ARTERY BYPASS GRAFT  09/2009   4 vessels  . ESOPHAGEAL DILATION  07/05/2019   Procedure: ESOPHAGEAL DILATION;  Surgeon: Rogene Houston, MD;  [facility];  Service: Endoscopy;;  . ESOPHAGOGASTRODUODENOSCOPY (EGD) WITH PROPOFOL N/A 07/05/2019   Procedure: ESOPHAGOGASTRODUODENOSCOPY (EGD) WITH PROPOFOL;  Surgeon: Rogene Houston, MD;  [facility];  Service: Endoscopy;  Laterality: N/A;  110-office notified pt new arrival time 10:45am  . FEMORAL HERNIA REPAIR    . INSERTION OF SUPRAPUBIC CATHETER  04/04/2012   Procedure: INSERTION OF SUPRAPUBIC CATHETER;  Surgeon: Claybon Jabs, MD;  [facility];  Service: Urology;  Laterality: N/A;  . LEAD REVISION N/A 12/30/2011   Procedure: LEAD REVISION;  Surgeon: Evans Lance, MD;  Location[facility] CATH LAB;  Service: Cardiovascular;  Laterality: N/A;  . LEFT HEART CATHETERIZATION WITH CORONARY/GRAFT ANGIOGRAM  12/29/2011   Procedure: LEFT HEART CATHETERIZATION WITH Beatrix Fetters;  Surgeon: Sherren Mocha, MD;  Location: [facility] CATH LAB;  Service: Cardiovascular;;  . PACEMAKER PLACEMENT    . TEMPORARY PACEMAKER INSERTION N/A 12/29/2011   Procedure: TEMPORARY PACEMAKER INSERTION;  Surgeon: Sherren Mocha, MD;  Location: [facility] CATH LAB;  Service: Cardiovascular;  Laterality: N/A;  . TRANSURETHRAL PROSTATECTOMY WITH GYRUS INSTRUMENTS  04/04/2012   Procedure: TRANSURETHRAL PROSTATECTOMY WITH GYRUS INSTRUMENTS;  Surgeon: Claybon Jabs, MD;  [facility];  Service: Urology;   Laterality: N/A;  TURP with Gyrus and Suprapubic Tube Placement  . YAG LASER APPLICATION Left [id number]   Procedure: YAG LASER APPLICATION;  Surgeon: Rutherford Guys, MD;  Location: [facility];  Service: Ophthalmology;  Laterality: Left;    Social History:  reports that he has never smoked. His smokeless tobacco use includes chew. He reports that he does not drink alcohol and does not use drugs.   Allergies  Allergen Reactions  . Penicillins Other (See Comments)    Unknown reaction  Did it involve swelling of the face/tongue/throat, SOB, or low BP? Unknown Did it involve sudden or severe rash/hives, skin peeling, or any reaction on the inside of your mouth or nose? Unknown Did you need to seek medical attention at a hospital or doctor's office? Unknown When did it last happen?      childhood allergy If all above answers are "NO", may proceed with cephalosporin use.     Family History  Problem Relation Age of Onset  . Heart block Mother   . Heart murmur Mother   . Aneurysm Father   .  Coronary artery disease Brother        x3  . Lung cancer Brother     ***  Prior to Admission medications   Medication Sig Start Date End Date Taking? Authorizing Provider  acetaminophen (TYLENOL) 650 MG CR tablet Take 650 mg by mouth every 8 (eight) hours as needed for pain or fever. 02/02/20  Yes [provider]  albuterol (VENTOLIN HFA) 108 (90 Base) MCG/ACT inhaler Inhale 2 puffs into the lungs every 6 (six) hours as needed for wheezing or shortness of breath. 07/28/22  Yes Evans Lance, MD  aspirin EC 81 MG tablet Take 1 tablet (81 mg total) by mouth every morning. 07/06/19  Yes Rehman, Mechele Dawley, MD  CONSTULOSE 10 GM/15ML solution Take 30 mLs by mouth 2 (two) times daily as needed for moderate constipation.  07/27/18  Yes [provider]  feeding supplement (ENSURE ENLIVE / ENSURE PLUS) LIQD Take 237 mLs by mouth 2 (two) times daily between meals. 03/16/22  Yes Shahmehdi, Seyed A, MD   furosemide (LASIX) 40 MG tablet Take 1 tablet (40 mg total) by mouth every other day. 08/05/22  Yes Evans Lance, MD  lisinopril (ZESTRIL) 10 MG tablet Take 1 tablet (10 mg total) by mouth 2 (two) times daily. 03/16/22 08/11/22 Yes Shahmehdi, Seyed A, MD  metoprolol tartrate (LOPRESSOR) 50 MG tablet Take 50 mg by mouth 2 (two) times daily.   Yes [provider]  MYRBETRIQ 25 MG TB24 tablet Take 25 mg by mouth daily. 06/26/22  Yes [provider]  pantoprazole (PROTONIX) 20 MG tablet TAKE ONE TABLET ('20MG'$  TOTAL) BY MOUTH TWO TIMES DAILY BEFORE A MEAL Patient taking differently: Take 20 mg by mouth 2 (two) times daily before a meal. 10/02/19  Yes Rehman, Mechele Dawley, MD  potassium chloride SA (K-DUR,KLOR-CON) 20 MEQ tablet Take 20 mEq by mouth every morning.    Yes [provider]  simvastatin (ZOCOR) 20 MG tablet Take 20 mg by mouth daily. 05/18/22  Yes [provider]  ALPRAZolam Duanne Moron) 0.5 MG tablet Take by mouth. Patient not taking: Reported on 08/11/2022 06/22/22   [provider]  QUEtiapine (SEROQUEL) 50 MG tablet Take 1 tablet (50 mg total) by mouth at bedtime. Patient not taking: Reported on 08/11/2022 03/16/22 07/28/22  Deatra James, MD  sulfamethoxazole-trimethoprim (BACTRIM DS) 800-160 MG tablet Take 1 tablet by mouth 2 (two) times daily. Patient not taking: Reported on 08/11/2022 07/27/22   [provider]  SURE COMFORT PEN NEEDLES 31G X 5 MM MISC USE AS DIRECTED UP TO 3ITIMES DAILY. 04/29/22   [provider]  tamsulosin (FLOMAX) 0.4 MG CAPS capsule Take 0.4 mg by mouth daily. Patient not taking: Reported on 08/11/2022 03/03/22   [provider]    Physical Exam: BP (!) 158/59 (BP Location: Right Arm)   Pulse 63   Temp 97.6 F (36.4 C) (Oral)   Resp 14   Ht '5\' 7"'$  (1.702 m)   Wt 70.5 kg   SpO2 99%   BMI 24.34 kg/m   General: 89 y.o. year-old male well developed well nourished in no acute distress.  Alert and  oriented x3. HEENT: NCAT, EOMI Neck: Supple, trachea medial Cardiovascular: Regular rate and rhythm with no rubs or gallops.  No thyromegaly or JVD noted.  +2 lower extremity edema bilaterally. 2/4 pulses in all 4 extremities. Respiratory: Bilateral Rales in lower lobes on auscultation.  No wheezes  Abdomen: Soft, nontender nondistended with normal bowel sounds x4 quadrants.  Muskuloskeletal: No cyanosis or clubbing noted bilaterally Neuro: CN II-XII intact, strength 5/5 x 4, sensation, reflexes intact Skin: No ulcerative lesions noted or rashes Psychiatry: Mood is appropriate for condition and setting          Labs on Admission:  Basic Metabolic Panel: Recent Labs  Lab 08/11/22 1823  NA 132*  K 4.0  CL 95*  CO2 26  GLUCOSE 123*  BUN 25*  CREATININE 1.31*  CALCIUM 9.9  MG 1.8   Liver Function Tests: Recent Labs  Lab 08/11/22 1823  AST 27  ALT 26  ALKPHOS 84  BILITOT 1.4*  PROT 7.4  ALBUMIN 4.3   No results for input(s): "LIPASE", "AMYLASE" in the last 168 hours. Recent Labs  Lab 08/11/22 1823  AMMONIA 15   CBC: Recent Labs  Lab 08/11/22 1823  WBC 9.2  NEUTROABS 6.5  HGB 13.3  HCT 40.5  MCV 89.0  PLT 187   Cardiac Enzymes: No results for input(s): "CKTOTAL", "CKMB", "CKMBINDEX", "TROPONINI" in the last 168 hours.  BNP (last 3 results) Recent Labs    03/04/22 1344 08/04/22 1554 08/11/22 1823  BNP 777.0* 947.0* 1,554.0*    ProBNP (last 3 results) No results for input(s): "PROBNP" in the last 8760 hours.  CBG: No results for input(s): "GLUCAP" in the last 168 hours.  Radiological Exams on Admission: CT Head Wo Contrast  Result Date: 08/11/2022 CLINICAL DATA:  Altered mental status EXAM: CT HEAD WITHOUT CONTRAST TECHNIQUE: Contiguous axial images were obtained from the base of the skull through the vertex without intravenous contrast. RADIATION DOSE REDUCTION: This exam was performed according to the departmental dose-optimization program which  includes automated exposure control, adjustment of the mA and/or kV according to patient size and/or use of iterative reconstruction technique. COMPARISON:  03/07/2022 FINDINGS: Brain: There is no mass, hemorrhage or extra-axial collection. There is generalized atrophy without lobar predilection. Hypodensity of the white matter is most commonly associated with chronic microvascular disease. Vascular: Atherosclerotic calcification of the vertebral and internal carotid arteries at the skull base. No abnormal hyperdensity of the major intracranial arteries or dural venous sinuses. Skull: The visualized skull base, calvarium and extracranial soft tissues are normal. Sinuses/Orbits: No fluid levels or advanced mucosal thickening of the visualized paranasal sinuses. No mastoid or middle ear effusion. The orbits are normal. IMPRESSION: 1. No acute intracranial abnormality. 2. Generalized atrophy and findings of chronic microvascular disease. Electronically Signed   By: Ulyses Jarred M.D.   On: 08/11/2022 19:12   DG Chest Portable 1 View  Result Date: 08/11/2022 CLINICAL DATA:  Heart failure.  Hematoma and bruising to left arm. EXAM: PORTABLE CHEST 1 VIEW COMPARISON:  Chest radiographs 08/04/2022 and 03/14/2022 FINDINGS: Status post median sternotomy and CABG. Cardiac silhouette again appears mildly enlarged. Left chest wall cardiac pacer is seen with leads overlying the right atrium and right ventricle. Normal variant azygous lobe is again noted. There is again moderate elevation of the right hemidiaphragm. Mild right pleural effusion again obscures the elevated right hemidiaphragm. There is subtle increased pleural fluid tracking up the lateral right fissure compared to 08/04/2022. No definitive left pleural effusion, noting a tiny left pleural effusion on 08/04/2022. No pneumothorax. Moderate bilateral interstitial thickening is similar to prior. No acute skeletal abnormality. IMPRESSION: 1. Moderate bilateral  interstitial thickening is similar to prior and may be due to pulmonary edema. 2. Mild right pleural effusion, slightly increased from 08/04/2022. Electronically Signed   By: Yvonne Kendall M.D.   On: 08/11/2022 18:39  DG Forearm Left  Result Date: 08/11/2022 CLINICAL DATA:  Fall. Two falls today out of wheelchair. Large hematoma and bruising to left arm. EXAM: LEFT ELBOW - 2 VIEW; LEFT FOREARM - 2 VIEW COMPARISON:  None Available. FINDINGS: Left elbow: No definite joint effusion. No acute fracture line is seen. Normal alignment. Mild elbow joint space narrowing. Mild vascular calcifications. Left forearm: No acute fracture is seen within the radius or ulna. Normal alignment at the wrist. Moderate vascular calcifications. IMPRESSION: 1. No acute fracture of the left elbow or forearm. 2. Mild elbow osteoarthritis. Electronically Signed   By: Yvonne Kendall M.D.   On: 08/11/2022 18:32   DG Elbow 2 Views Left  Result Date: 08/11/2022 CLINICAL DATA:  Fall. Two falls today out of wheelchair. Large hematoma and bruising to left arm. EXAM: LEFT ELBOW - 2 VIEW; LEFT FOREARM - 2 VIEW COMPARISON:  None Available. FINDINGS: Left elbow: No definite joint effusion. No acute fracture line is seen. Normal alignment. Mild elbow joint space narrowing. Mild vascular calcifications. Left forearm: No acute fracture is seen within the radius or ulna. Normal alignment at the wrist. Moderate vascular calcifications. IMPRESSION: 1. No acute fracture of the left elbow or forearm. 2. Mild elbow osteoarthritis. Electronically Signed   By: Yvonne Kendall M.D.   On: 08/11/2022 18:32    EKG: I independently viewed the EKG done and my findings are as followed:    Assessment/Plan Present on Admission: **None**  Principal Problem:   Acute exacerbation of CHF (congestive heart failure) (HCC)   DVT prophylaxis: ***   Code Status: ***   Family Communication: ***   Disposition Plan: ***   Consults called: ***   Admission  status: ***     Bernadette Hoit MD [facility] Pager [phone]  If 7PM-7AM, please contact night-coverage [email or web address] Password [facility]  08/11/2022, 11:52 PM         Review of Systems: {ROS_Text:26778} Past Medical History:  Diagnosis Date  . Arthritis   . CAD (coronary artery disease)    a.  90% LAD stenosis in 1995 treated with PTCA;  b. 09/2009 CABG x 4: LIMA->LAD, VG->Diag, VG->OM, VG->PDA;  c. 11/2011 Cath: 3VD, 4/4 patent grafts, EF 70%.  . Chronic constipation   . Colonic polyp   . Complete heart block ([place])    a. 11/2011 s/p MDT Adapta L ADDRL 1 Ser # [id number] H.  . Complication of anesthesia    PROBLEMS VOIDING AFTER PREVIOUS SURGERIES  . Diabetes mellitus without complication ([place])    NOT ON ANY DIABETIC MEDICATIONS  . Fasting hyperglycemia   . GERD (gastroesophageal reflux disease)   . Hyperlipidemia   . Hypertension   . Pacemaker JULY 2013   DR.  G.TAYLOR AND DR. Clearnce Hasten  . Pseudophakia of both eyes   . Seizures ([place])    a. prior to diagnosis of heart block and syncope, this was in the differential and pt was briefly on Keppra, started by ER MD.  . Syncope    a. 11/2011 18 second pauses noted on Event Monitor assoc w/ syncope  . Trauma 1952   Abdominal and chest in 1952; multiple injuries including pelvic fracture, rib fractures and ruptured bladder  . Urinary retention    PT HAS INDWELLING FOLEY CATHETER   Past Surgical History:  Procedure Laterality Date  . BIOPSY  07/05/2019   Procedure: BIOPSY;  Surgeon: Rogene Houston, MD;  [facility];  Service: Endoscopy;;  gastric  . BLADDER SURGERY  1950'S  .  CATARACT EXTRACTION W/PHACO Right 11/22/2012   Procedure: CATARACT EXTRACTION PHACO AND INTRAOCULAR LENS PLACEMENT (IOC);  Surgeon: Elta Guadeloupe T. Gershon Crane, MD;  [facility];  Service: Ophthalmology;  Laterality: Right;  CDE 10.27  . CATARACT EXTRACTION W/PHACO Left 12/20/2012   Procedure: CATARACT EXTRACTION PHACO AND INTRAOCULAR LENS  PLACEMENT (IOC);  Surgeon: Elta Guadeloupe T. Gershon Crane, MD;  Location: [facility];  Service: Ophthalmology;  Laterality: Left;  CDE:6.67  . COLONOSCOPY W/ POLYPECTOMY    . CORONARY ARTERY BYPASS GRAFT  09/2009   4 vessels  . ESOPHAGEAL DILATION  07/05/2019   Procedure: ESOPHAGEAL DILATION;  Surgeon: Rogene Houston, MD;  [facility];  Service: Endoscopy;;  . ESOPHAGOGASTRODUODENOSCOPY (EGD) WITH PROPOFOL N/A 07/05/2019   Procedure: ESOPHAGOGASTRODUODENOSCOPY (EGD) WITH PROPOFOL;  Surgeon: Rogene Houston, MD;  [facility];  Service: Endoscopy;  Laterality: N/A;  110-office notified pt new arrival time 10:45am  . FEMORAL HERNIA REPAIR    . INSERTION OF SUPRAPUBIC CATHETER  04/04/2012   Procedure: INSERTION OF SUPRAPUBIC CATHETER;  Surgeon: Claybon Jabs, MD;  [facility];  Service: Urology;  Laterality: N/A;  . LEAD REVISION N/A 12/30/2011   Procedure: LEAD REVISION;  Surgeon: Evans Lance, MD;  Location: [facility] CATH LAB;  Service: Cardiovascular;  Laterality: N/A;  . LEFT HEART CATHETERIZATION WITH CORONARY/GRAFT ANGIOGRAM  12/29/2011   Procedure: LEFT HEART CATHETERIZATION WITH Beatrix Fetters;  Surgeon: Sherren Mocha, MD;  Location: [facility] CATH LAB;  Service: Cardiovascular;;  . PACEMAKER PLACEMENT    . TEMPORARY PACEMAKER INSERTION N/A 12/29/2011   Procedure: TEMPORARY PACEMAKER INSERTION;  Surgeon: Sherren Mocha, MD;  Location: [facility] CATH LAB;  Service: Cardiovascular;  Laterality: N/A;  . TRANSURETHRAL PROSTATECTOMY WITH GYRUS INSTRUMENTS  04/04/2012   Procedure: TRANSURETHRAL PROSTATECTOMY WITH GYRUS INSTRUMENTS;  Surgeon: Claybon Jabs, MD;  [facility];  Service: Urology;  Laterality: N/A;  TURP with Gyrus and Suprapubic Tube Placement  . YAG LASER APPLICATION Left [id number]   Procedure: YAG LASER APPLICATION;  Surgeon: Rutherford Guys, MD;  [facility];  Service: Ophthalmology;  Laterality: Left;   Social History:  reports that he has never smoked. His smokeless  tobacco use includes chew. He reports that he does not drink alcohol and does not use drugs.  Allergies  Allergen Reactions  . Penicillins Other (See Comments)    Unknown reaction  Did it involve swelling of the face/tongue/throat, SOB, or low BP? Unknown Did it involve sudden or severe rash/hives, skin peeling, or any reaction on the inside of your mouth or nose? Unknown Did you need to seek medical attention at a hospital or doctor's office? Unknown When did it last happen?      childhood allergy If all above answers are "NO", may proceed with cephalosporin use.     Family History  Problem Relation Age of Onset  . Heart block Mother   . Heart murmur Mother   . Aneurysm Father   . Coronary artery disease Brother        x3  . Lung cancer Brother     Prior to Admission medications   Medication Sig Start Date End Date Taking? Authorizing Provider  acetaminophen (TYLENOL) 650 MG CR tablet Take 650 mg by mouth every 8 (eight) hours as needed for pain or fever. 02/02/20  Yes [provider]  albuterol (VENTOLIN HFA) 108 (90 Base) MCG/ACT inhaler Inhale 2 puffs into the lungs every 6 (six) hours as needed for wheezing or shortness of breath. 07/28/22  Yes Evans Lance, MD  aspirin EC 81 MG tablet Take 1 tablet (81 mg total) by mouth every morning. 07/06/19  Yes Rehman, Mechele Dawley, MD  CONSTULOSE 10 GM/15ML solution Take 30 mLs by mouth 2 (two) times daily as needed for moderate constipation.  07/27/18  Yes [provider]  feeding supplement (ENSURE ENLIVE / ENSURE PLUS) LIQD Take 237 mLs by mouth 2 (two) times daily between meals. 03/16/22  Yes Shahmehdi, Seyed A, MD  furosemide (LASIX) 40 MG tablet Take 1 tablet (40 mg total) by mouth every other day. 08/05/22  Yes Evans Lance, MD  lisinopril (ZESTRIL) 10 MG tablet Take 1 tablet (10 mg total) by mouth 2 (two) times daily. 03/16/22 08/11/22 Yes Shahmehdi, Seyed A, MD  metoprolol tartrate (LOPRESSOR) 50 MG tablet Take 50 mg  by mouth 2 (two) times daily.   Yes [provider]  MYRBETRIQ 25 MG TB24 tablet Take 25 mg by mouth daily. 06/26/22  Yes [provider]  pantoprazole (PROTONIX) 20 MG tablet TAKE ONE TABLET ('20MG'$  TOTAL) BY MOUTH TWO TIMES DAILY BEFORE A MEAL Patient taking differently: Take 20 mg by mouth 2 (two) times daily before a meal. 10/02/19  Yes Rehman, Mechele Dawley, MD  potassium chloride SA (K-DUR,KLOR-CON) 20 MEQ tablet Take 20 mEq by mouth every morning.    Yes [provider]  simvastatin (ZOCOR) 20 MG tablet Take 20 mg by mouth daily. 05/18/22  Yes [provider]  ALPRAZolam Duanne Moron) 0.5 MG tablet Take by mouth. Patient not taking: Reported on 08/11/2022 06/22/22   [provider]  QUEtiapine (SEROQUEL) 50 MG tablet Take 1 tablet (50 mg total) by mouth at bedtime. Patient not taking: Reported on 08/11/2022 03/16/22 07/28/22  Deatra James, MD  sulfamethoxazole-trimethoprim (BACTRIM DS) 800-160 MG tablet Take 1 tablet by mouth 2 (two) times daily. Patient not taking: Reported on 08/11/2022 07/27/22   [provider]  SURE COMFORT PEN NEEDLES 31G X 5 MM MISC USE AS DIRECTED UP TO 3ITIMES DAILY. 04/29/22   [provider]  tamsulosin (FLOMAX) 0.4 MG CAPS capsule Take 0.4 mg by mouth daily. Patient not taking: Reported on 08/11/2022 03/03/22   [provider]    Physical Exam: Vitals:   08/11/22 1713 08/11/22 1826 08/11/22 1930 08/11/22 2030  BP:  (!) 163/86 (!) 157/117 (!) 178/75  Pulse:  71 71 69  Resp:  18 (!) 22 (!) 21  Temp:      TempSrc:      SpO2:  96% 91% 90%  Weight: 80.7 kg     Height: '5\' 7"'$  (1.702 m)      *** Data Reviewed: {Tip this will not be part of the note when signed- Document your independent interpretation of telemetry tracing, EKG, lab, Radiology test or any other diagnostic tests. Add any new diagnostic test ordered today. (Optional):26781} {Results:26384}  Assessment and Plan: No notes have been filed  under this hospital service. Service: Hospitalist     Advance Care Planning:   Code Status: Prior ***  Consults: ***  Family Communication: ***  Severity of Illness: {Observation/Inpatient:21159}  Author: Bernadette Hoit, DO 08/11/2022 8:52 PM  For on call review [email or web address].

## 2022-08-11 NOTE — ED Provider Notes (Signed)
[facility] Provider Note  CSN: [id number] Arrival date & time: 08/11/22 1659  Chief Complaint(s) Fall, Hallucinations, and Shortness of Breath  HPI Guy Roberson is a 89 y.o. male with history of coronary artery disease, CHF, diabetes, hypertension, hyperlipidemia, prior pacemaker, presenting to the emergency department with confusion.  Patient family reports that he has been confused on and off for the past few weeks.  He did recently have a change in his diuretic medication which was decreased, since then has had some increased shortness of breath, orthopnea.  His wife uses oxygen and he has been using his wife's oxygen at home.  No fevers or chills.  Patient denies any chest pain.  Symptoms are worse with lying flat.  There is also some concern that he may have a urine infection given his confusion, his primary doctor started him on a sulfa antibiotic but then told him to stop as they were apparently concerned that the antibiotic may be contributing to his confusion.  He has also been unsteady and had some recent falls, today fell and landed on his left forearm, couple days ago fell and hit his both elbows.  No clear history of head trauma.  Patient denies any complaints currently besides feeling short of breath.   Past Medical History Past Medical History:  Diagnosis Date   Arthritis    CAD (coronary artery disease)    a.  90% LAD stenosis in 1995 treated with PTCA;  b. 09/2009 CABG x 4: LIMA->LAD, VG->Diag, VG->OM, VG->PDA;  c. 11/2011 Cath: 3VD, 4/4 patent grafts, EF 70%.   Chronic constipation    Colonic polyp    Complete heart block ([place])    a. 11/2011 s/p MDT Adapta L ADDRL 1 Ser # [id number] H.   Complication of anesthesia    PROBLEMS VOIDING AFTER PREVIOUS SURGERIES   Diabetes mellitus without complication ([place])    NOT ON ANY DIABETIC MEDICATIONS   Fasting hyperglycemia    GERD (gastroesophageal reflux disease)    Hyperlipidemia     Hypertension    Pacemaker JULY 2013   DR.  G.TAYLOR AND DR. Clearnce Hasten   Pseudophakia of both eyes    Seizures ([place])    a. prior to diagnosis of heart block and syncope, this was in the differential and pt was briefly on Keppra, started by ER MD.   Syncope    a. 11/2011 18 second pauses noted on Event Monitor assoc w/ syncope   Trauma 1952   Abdominal and chest in 1952; multiple injuries including pelvic fracture, rib fractures and ruptured bladder   Urinary retention    PT HAS INDWELLING FOLEY CATHETER   Patient Active Problem List   Diagnosis Date Noted   Acute exacerbation of CHF (congestive heart failure) ([place]) 03/05/2022   Generalized weakness 03/04/2022   COVID-19 virus infection 03/04/2022   Acute congestive heart failure ([place]) 03/04/2022   Hypokalemia 03/04/2022   Gross hematuria 07/07/2021   Benign prostatic hyperplasia with urinary retention 07/07/2021   Bladder diverticulum 07/07/2021   Neurogenic bladder 07/07/2021   Urinary retention 01/01/2012    Class: Present on Admission   Pacemaker-Medtr[employer] 12/30/2011   Acute respiratory failure ([place]) 12/29/2011   Rib fracture 12/29/2011   Hypoxemia 12/29/2011   Syncope    Complete heart block (H[facility])    Hypertension    Hyperlipidemia    CAD (coronary artery disease)    Arteriosclerotic cardiovascular disease (ASCVD)    Fasting hyperglycemia    Colonic  polyp    DM (diabetes mellitus) ([place]) 10/02/2009   HYPERLIPIDEMIA 09/30/2009   Essential hypertension 09/30/2009   Home Medication(s) Prior to Admission medications   Medication Sig Start Date End Date Taking? Authorizing Provider  acetaminophen (TYLENOL) 650 MG CR tablet Take 650 mg by mouth every 8 (eight) hours as needed for pain or fever. 02/02/20  Yes [provider]  albuterol (VENTOLIN HFA) 108 (90 Base) MCG/ACT inhaler Inhale 2 puffs into the lungs every 6 (six) hours as needed for wheezing or shortness of breath. 07/28/22  Yes Evans Lance, MD  aspirin EC  81 MG tablet Take 1 tablet (81 mg total) by mouth every morning. 07/06/19  Yes Rehman, Mechele Dawley, MD  CONSTULOSE 10 GM/15ML solution Take 30 mLs by mouth 2 (two) times daily as needed for moderate constipation.  07/27/18  Yes [provider]  feeding supplement (ENSURE ENLIVE / ENSURE PLUS) LIQD Take 237 mLs by mouth 2 (two) times daily between meals. 03/16/22  Yes Shahmehdi, Seyed A, MD  furosemide (LASIX) 40 MG tablet Take 1 tablet (40 mg total) by mouth every other day. 08/05/22  Yes Evans Lance, MD  lisinopril (ZESTRIL) 10 MG tablet Take 1 tablet (10 mg total) by mouth 2 (two) times daily. 03/16/22 08/11/22 Yes Shahmehdi, Seyed A, MD  metoprolol tartrate (LOPRESSOR) 50 MG tablet Take 50 mg by mouth 2 (two) times daily.   Yes [provider]  MYRBETRIQ 25 MG TB24 tablet Take 25 mg by mouth daily. 06/26/22  Yes [provider]  pantoprazole (PROTONIX) 20 MG tablet TAKE ONE TABLET ('20MG'$  TOTAL) BY MOUTH TWO TIMES DAILY BEFORE A MEAL Patient taking differently: Take 20 mg by mouth 2 (two) times daily before a meal. 10/02/19  Yes Rehman, Mechele Dawley, MD  potassium chloride SA (K-DUR,KLOR-CON) 20 MEQ tablet Take 20 mEq by mouth every morning.    Yes [provider]  simvastatin (ZOCOR) 20 MG tablet Take 20 mg by mouth daily. 05/18/22  Yes [provider]  ALPRAZolam Duanne Moron) 0.5 MG tablet Take by mouth. Patient not taking: Reported on 08/11/2022 06/22/22   [provider]  QUEtiapine (SEROQUEL) 50 MG tablet Take 1 tablet (50 mg total) by mouth at bedtime. Patient not taking: Reported on 08/11/2022 03/16/22 07/28/22  Deatra James, MD  sulfamethoxazole-trimethoprim (BACTRIM DS) 800-160 MG tablet Take 1 tablet by mouth 2 (two) times daily. Patient not taking: Reported on 08/11/2022 07/27/22   [provider]  SURE COMFORT PEN NEEDLES 31G X 5 MM MISC USE AS DIRECTED UP TO 3ITIMES DAILY. 04/29/22   [provider]  tamsulosin (FLOMAX) 0.4 MG  CAPS capsule Take 0.4 mg by mouth daily. Patient not taking: Reported on 08/11/2022 03/03/22   [provider]  Past Surgical History Past Surgical History:  Procedure Laterality Date   BIOPSY  07/05/2019   Procedure: BIOPSY;  Surgeon: Rogene Houston, MD;  [facility];  Service: Endoscopy;;  gastric   BLADDER SURGERY  1950'S   CATARACT EXTRACTION W/PHACO Right 11/22/2012   Procedure: CATARACT EXTRACTION PHACO AND INTRAOCULAR LENS PLACEMENT ([place]);  Surgeon: Elta Guadeloupe T. Gershon Crane, MD;  Location: [facility];  Service: Ophthalmology;  Laterality: Right;  CDE 10.27   CATARACT EXTRACTION W/PHACO Left 12/20/2012   Procedure: CATARACT EXTRACTION PHACO AND INTRAOCULAR LENS PLACEMENT ([facility]);  Surgeon: Elta Guadeloupe T. Gershon Crane, MD;  Location: [facility];  Service: Ophthalmology;  Laterality: Left;  CDE:6.67   COLONOSCOPY W/ POLYPECTOMY     CORONARY ARTERY BYPASS GRAFT  09/2009   4 vessels   ESOPHAGEAL DILATION  07/05/2019   Procedure: ESOPHAGEAL DILATION;  Surgeon: Rogene Houston, MD;  [facility];  Service: Endoscopy;;   ESOPHAGOGASTRODUODENOSCOPY (EGD) WITH PROPOFOL N/A 07/05/2019   Procedure: ESOPHAGOGASTRODUODENOSCOPY (EGD) WITH PROPOFOL;  Surgeon: Rogene Houston, MD;  Location: [facility];  Service: Endoscopy;  Laterality: N/A;  110-office notified pt new arrival time 10:45am   FEMORAL HERNIA REPAIR     INSERTION OF SUPRAPUBIC CATHETER  04/04/2012   Procedure: INSERTION OF SUPRAPUBIC CATHETER;  Surgeon: Claybon Jabs, MD;  Location: [facility];  Service: Urology;  Laterality: N/A;   LEAD REVISION N/A 12/30/2011   Procedure: LEAD REVISION;  Surgeon: Evans Lance, MD;  Location: [facility] CATH LAB;  Service: Cardiovascular;  Laterality: N/A;   LEFT HEART CATHETERIZATION WITH CORONARY/GRAFT ANGIOGRAM  12/29/2011   Procedure: LEFT HEART CATHETERIZATION WITH Beatrix Fetters;  Surgeon: Sherren Mocha, MD;  Location[facility] C[facility] LAB;  Service: Cardiovascular;;   PACEMAKER PLACEMENT     TEMPORARY PACEMAKER INSERTION N/A 12/29/2011   Procedure: TEMPORARY PACEMAKER INSERTION;  Surgeon: Sherren Mocha, MD;  Location: [facility] CATH LAB;  Service: Cardiovascular;  Laterality: N/A;   TRANSURETHRAL PROSTATECTOMY WITH GYRUS INSTRUMENTS  04/04/2012   Procedure: TRANSURETHRAL PROSTATECTOMY WITH GYRUS INSTRUMENTS;  Surgeon: Claybon Jabs, MD;  Location: [facility];  Service: Urology;  Laterality: N/A;  TURP with Gyrus and Suprapubic Tube Placement   YAG LASER APPLICATION Left [id number]   Procedure: YAG LASER APPLICATION;  Surgeon: Rutherford Guys, MD;  Location: [facility];  Service: Ophthalmology;  Laterality: Left;   Family History Family History  Problem Relation Age of Onset   Heart block Mother    Heart murmur Mother    Aneurysm Father    Coronary artery disease Brother        x3   Lung cancer Brother     Social History Social History   Tobacco Use   Smoking status: Never   Smokeless tobacco: Current    Types: Chew  Vaping Use   Vaping Use: Never used  Substance Use Topics   Alcohol use: No   Drug use: No   Allergies Penicillins  Review of Systems Review of Systems  All other systems reviewed and are negative.   Physical Exam Vital Signs  I have reviewed the triage vital signs BP (!) 178/75   Pulse 69   Temp 97.7 F (36.5 C) (Oral)   Resp (!) 21   Ht '5\' 7"'$  (1.702 m)   Wt 80.7 kg   SpO2 90%   BMI 27.88 kg/m  Physical Exam Vitals and nursing note reviewed.  Constitutional:      General: He is not in acute distress.    Appearance: Normal  appearance.  HENT:     Head: Normocephalic and atraumatic.     Mouth/Throat:     Mouth: Mucous membranes are moist.  Eyes:     Conjunctiva/sclera: Conjunctivae normal.  Neck:     Vascular: Hepatojugular reflux present.  Cardiovascular:     Rate and Rhythm: Normal rate and regular rhythm.  Pulmonary:      Effort: Pulmonary effort is normal. Tachypnea present. No respiratory distress.     Breath sounds: Examination of the right-lower field reveals rales. Examination of the left-lower field reveals rales. Rales present.  Abdominal:     General: Abdomen is flat.     Palpations: Abdomen is soft.     Tenderness: There is no abdominal tenderness.  Musculoskeletal:     Right lower leg: Edema present.     Left lower leg: Edema present.     Comments: No midline C, T, L-spine tenderness.  No chest wall tenderness or crepitus.  Full painless range of motion at the bilateral upper extremities including the shoulders, elbows, wrists, hand and fingers, and in the bilateral lower extremities including the hips, knees, ankle, toes.  Small amount of tenderness over the left forearm.  No focal bony tenderness, injury or deformity.   Skin:    General: Skin is warm and dry.     Capillary Refill: Capillary refill takes less than 2 seconds.     Comments: Large ecchymosis over the bilateral lower arms  Neurological:     Mental Status: He is alert and oriented to person, place, and time. Mental status is at baseline.  Psychiatric:        Mood and Affect: Mood normal.        Behavior: Behavior normal.     ED Results and Treatments Labs (all labs ordered are listed, but only abnormal results are displayed) Labs Reviewed  BRAIN NATRIURETIC PEPTIDE - Abnormal; Notable for the following components:      Result Value   B Natriuretic Peptide 1,554.0 (*)    All other components within normal limits  COMPREHENSIVE METABOLIC PANEL - Abnormal; Notable for the following components:   Sodium 132 (*)    Chloride 95 (*)    Glucose, Bld 123 (*)    BUN 25 (*)    Creatinine, Ser 1.31 (*)    Total Bilirubin 1.4 (*)    GFR, Estimated 52 (*)    All other components within normal limits  URINALYSIS, W/ REFLEX TO CULTURE (INFECTION SUSPECTED) - Abnormal; Notable for the following components:   Hgb urine dipstick TRACE (*)     Protein, ur TRACE (*)    Bacteria, UA RARE (*)    All other components within normal limits  CBC WITH DIFFERENTIAL/PLATELET  MAGNESIUM  TSH  AMMONIA  RAPID URINE DRUG SCREEN, HOSP PERFORMED  TROPONIN I (HIGH SENSITIVITY)  TROPONIN I (HIGH SENSITIVITY)  Radiology CT Head Wo Contrast  Result Date: 08/11/2022 CLINICAL DATA:  Altered mental status EXAM: CT HEAD WITHOUT CONTRAST TECHNIQUE: Contiguous axial images were obtained from the base of the skull through the vertex without intravenous contrast. RADIATION DOSE REDUCTION: This exam was performed according to the departmental dose-optimization program which includes automated exposure control, adjustment of the mA and/or kV according to patient size and/or use of iterative reconstruction technique. COMPARISON:  03/07/2022 FINDINGS: Brain: There is no mass, hemorrhage or extra-axial collection. There is generalized atrophy without lobar predilection. Hypodensity of the white matter is most commonly associated with chronic microvascular disease. Vascular: Atherosclerotic calcification of the vertebral and internal carotid arteries at the skull base. No abnormal hyperdensity of the major intracranial arteries or dural venous sinuses. Skull: The visualized skull base, calvarium and extracranial soft tissues are normal. Sinuses/Orbits: No fluid levels or advanced mucosal thickening of the visualized paranasal sinuses. No mastoid or middle ear effusion. The orbits are normal. IMPRESSION: 1. No acute intracranial abnormality. 2. Generalized atrophy and findings of chronic microvascular disease. Electronically Signed   By: Ulyses Jarred M.D.   On: 08/11/2022 19:12   DG Chest Portable 1 View  Result Date: 08/11/2022 CLINICAL DATA:  Heart failure.  Hematoma and bruising to left arm. EXAM: PORTABLE CHEST 1 VIEW COMPARISON:  Chest  radiographs 08/04/2022 and 03/14/2022 FINDINGS: Status post median sternotomy and CABG. Cardiac silhouette again appears mildly enlarged. Left chest wall cardiac pacer is seen with leads overlying the right atrium and right ventricle. Normal variant azygous lobe is again noted. There is again moderate elevation of the right hemidiaphragm. Mild right pleural effusion again obscures the elevated right hemidiaphragm. There is subtle increased pleural fluid tracking up the lateral right fissure compared to 08/04/2022. No definitive left pleural effusion, noting a tiny left pleural effusion on 08/04/2022. No pneumothorax. Moderate bilateral interstitial thickening is similar to prior. No acute skeletal abnormality. IMPRESSION: 1. Moderate bilateral interstitial thickening is similar to prior and may be due to pulmonary edema. 2. Mild right pleural effusion, slightly increased from 08/04/2022. Electronically Signed   By: Yvonne Kendall M.D.   On: 08/11/2022 18:39   DG Forearm Left  Result Date: 08/11/2022 CLINICAL DATA:  Fall. Two falls today out of wheelchair. Large hematoma and bruising to left arm. EXAM: LEFT ELBOW - 2 VIEW; LEFT FOREARM - 2 VIEW COMPARISON:  None Available. FINDINGS: Left elbow: No definite joint effusion. No acute fracture line is seen. Normal alignment. Mild elbow joint space narrowing. Mild vascular calcifications. Left forearm: No acute fracture is seen within the radius or ulna. Normal alignment at the wrist. Moderate vascular calcifications. IMPRESSION: 1. No acute fracture of the left elbow or forearm. 2. Mild elbow osteoarthritis. Electronically Signed   By: Yvonne Kendall M.D.   On: 08/11/2022 18:32   DG Elbow 2 Views Left  Result Date: 08/11/2022 CLINICAL DATA:  Fall. Two falls today out of wheelchair. Large hematoma and bruising to left arm. EXAM: LEFT ELBOW - 2 VIEW; LEFT FOREARM - 2 VIEW COMPARISON:  None Available. FINDINGS: Left elbow: No definite joint effusion. No acute fracture  line is seen. Normal alignment. Mild elbow joint space narrowing. Mild vascular calcifications. Left forearm: No acute fracture is seen within the radius or ulna. Normal alignment at the wrist. Moderate vascular calcifications. IMPRESSION: 1. No acute fracture of the left elbow or forearm. 2. Mild elbow osteoarthritis. Electronically Signed   By: Yvonne Kendall M.D.   On: 08/11/2022 18:32  Pertinent labs & imaging results that were available during my care of the patient were reviewed by me and considered in my medical decision making (see MDM for details).  Medications Ordered in ED Medications  furosemide (LASIX) injection 40 mg (40 mg Intravenous Given 08/11/22 1821)                                                                                                                                     Procedures Procedures  (including critical care time)  Medical Decision Making / ED Course   MDM:  89 year old male presenting to the emergency department with confusion.  Clinically, patient has acute CHF exacerbation.  He is borderline hypoxic in the room.  He is tachypneic and he has basilar crackles with lower extremity edema.  This may be contributing to his altered mental status.  Altered mental status seems most consistent with delirium.  Will also check toxic/metabolic workup to evaluate for electrolyte disturbance, other metabolic condition which may influence his mental status.  Will check CT head given reported frequent falls to evaluate for intracranial process as cause of his confusion.  Also check infectious workup with chest x-ray, urinalysis.  Will give IV diuretics.  Given confusion, frequent falls patient will need to be admitted for further management.  He has had recent falls but traumatic exam only notable for some tenderness to the left forearm.  He has bruising to both forearms but no tenderness in the right upper extremity.  Clinical Course as of 08/11/22 2056  Tue Aug 11, 2022  2054 Signed out to hospitalist who will admit the patient.  [WS]    Clinical Course User Index [WS] Cristie Hem, MD     Additional history obtained: -Additional history obtained from family -External records from outside source obtained and reviewed including: Chart review including previous notes, labs, imaging, consultation notes including labs 3/5   Lab Tests: -I ordered, reviewed, and interpreted labs.   The pertinent results include:   Labs Reviewed  BRAIN NATRIURETIC PEPTIDE - Abnormal; Notable for the following components:      Result Value   B Natriuretic Peptide 1,554.0 (*)    All other components within normal limits  COMPREHENSIVE METABOLIC PANEL - Abnormal; Notable for the following components:   Sodium 132 (*)    Chloride 95 (*)    Glucose, Bld 123 (*)    BUN 25 (*)    Creatinine, Ser 1.31 (*)    Total Bilirubin 1.4 (*)    GFR, Estimated 52 (*)    All other components within normal limits  URINALYSIS, W/ REFLEX TO CULTURE (INFECTION SUSPECTED) - Abnormal; Notable for the following components:   Hgb urine dipstick TRACE (*)    Protein, ur TRACE (*)    Bacteria, UA RARE (*)    All other components within normal limits  CBC WITH DIFFERENTIAL/PLATELET  MAGNESIUM  TSH  AMMONIA  RAPID  URINE DRUG SCREEN, HOSP PERFORMED  TROPONIN I (HIGH SENSITIVITY)  TROPONIN I (HIGH SENSITIVITY)    Notable for elevated BNP. No UTI. Mild AKI  EKG   EKG Interpretation  Date/Time:  Tuesday August 11 2022 17:19:22 EDT Ventricular Rate:  67 PR Interval:  154 QRS Duration: 202 QT Interval:  462 QTC Calculation: 488 R Axis:   -84 Text Interpretation: AV dual-paced rhythm Abnormal ECG When compared with ECG of 07-Mar-2022 02:39, No significant change was found Confirmed by Garnette Gunner [phone]) on 08/11/2022 6:04:08 PM         Imaging Studies ordered: I ordered imaging studies including CT head, CXR, XR left arm On my interpretation imaging demonstrates  no fracture/ICH. CXR c/f volume overload I independently visualized and interpreted imaging. I agree with the radiologist interpretation   Medicines ordered and prescription drug management: Meds ordered this encounter  Medications   furosemide (LASIX) injection 40 mg    -I have reviewed the patients home medicines and have made adjustments as needed   Consultations Obtained: I requested consultation with the hospitalist,  and discussed lab and imaging findings as well as pertinent plan - they recommend: admission   Cardiac Monitoring: The patient was maintained on a cardiac monitor.  I personally viewed and interpreted the cardiac monitored which showed an underlying rhythm of: paced  Social Determinants of Health:  Diagnosis or treatment significantly limited by social determinants of health: former smoker   Reevaluation: After the interventions noted above, I reevaluated the patient and found that their symptoms have improved  Co morbidities that complicate the patient evaluation  Past Medical History:  Diagnosis Date   Arthritis    CAD (coronary artery disease)    a.  90% LAD stenosis in 1995 treated with PTCA;  b. 09/2009 CABG x 4: LIMA->LAD, VG->Diag, VG->OM, VG->PDA;  c. 11/2011 Cath: 3VD, 4/4 patent grafts, EF 70%.   Chronic constipation    Colonic polyp    Complete heart block ([place])    a. 11/2011 s/p MDT Adapta L ADDRL 1 Ser # [id number] H.   Complication of anesthesia    PROBLEMS VOIDING AFTER PREVIOUS SURGERIES   Diabetes mellitus without complication ([place])    NOT ON ANY DIABETIC MEDICATIONS   Fasting hyperglycemia    GERD (gastroesophageal reflux disease)    Hyperlipidemia    Hypertension    Pacemaker JULY 2013   DR.  G.TAYLOR AND DR. Clearnce Hasten   Pseudophakia of both eyes    Seizures ([place])    a. prior to diagnosis of heart block and syncope, this was in the differential and pt was briefly on Keppra, started by ER MD.   Syncope    a. 11/2011 18 second pauses  noted on Event Monitor assoc w/ syncope   Trauma 1952   Abdominal and chest in 1952; multiple injuries including pelvic fracture, rib fractures and ruptured bladder   Urinary retention    PT HAS INDWELLING FOLEY CATHETER      Dispostion: Disposition decision including need for hospitalization was considered, and patient admitted to the hospital.    Final Clinical Impression(s) / ED Diagnoses Final diagnoses:  Acute congestive heart failure, unspecified heart failure type [facility])     This chart was dictated using voice recognition software.  Despite best efforts to proofread,  errors can occur which can change the documentation meaning.    Cristie Hem, MD 08/11/22 2056

## 2022-08-11 NOTE — ED Notes (Signed)
Pt returned from CT °

## 2022-08-11 NOTE — H&P (Signed)
History and Physical    Patient: Guy Roberson [id number] DOB: 20-Mar-1932 DOA: 08/11/2022 DOS: the patient was seen and examined on 08/12/2022 PCP: [name], MD  Patient coming from: Home  Chief Complaint:  Chief Complaint  Patient presents with   Fall   Hallucinations   Shortness of Breath   HPI: Guy Roberson is a 89 y.o. male with medical history significant of hypertension, hyperlipidemia, CAD s/p CABG, complete heart block status post pacemaker placement, GERD, T2DM who presents to the emergency department due to confusion.  Patient was unable to provide history, history was obtained from ED physician and ED medical record.  Per report, patient was reported to have had intermittent confusion within the last few weeks.  His dose of diuretic was recently decreased and patient has since started to develop worsening shortness of breath to the extent that he has started to use wife's oxygen at home, shortness of breath worsens when laying flat in bed.  Patient was recently started on sulfa antibiotics due to concern for UTI, but this was stopped after the onset of confusion since it was suspected that the sulfa antibiotic may have been contributing to patient's confusion.  Patient was reported to have been unsteady and sustained falls few days ago whereby he hit both elbows and the most recent fall being today whereby he fell and landed on his left forearm.  He denied head trauma, chest pain, fever, chills.  ED Course:  In the emergency department, blood pressure was 177/70, but other vital signs were within normal range.  Workup in the ED showed normal CBC, BMP showed sodium 132, potassium 4.0, chloride 95, bicarb 26, blood glucose 123, BUN/creatinine 25/1.31 eGFR 52.  BNP 1554, this was 947 on 08/04/2022, TSH 1.728, urinalysis was unimpressive for UTI, urine drug screen was normal, troponin x 2 was flat at 16, ammonia 15, magnesium 1.8. CT head without contrast showed no acute  intracranial abnormality Chest x-ray showed moderate bilateral interstitial thickening which is similar to prior and may be due to pulmonary edema.  Mild right pleural effusion, slightly increased from 08/04/22 Left elbow x-ray showed no acute fracture of the left elbow or forearm.  Mild elbow osteoarthritis IV Lasix 40 mg x 1 was given.  Hospitalist was asked to admit patient for further evaluation and management.   Review of Systems: Review of systems as noted in the HPI. All other systems reviewed and are negative.   Past Medical History:  Diagnosis Date   Arthritis    CAD (coronary artery disease)    a.  90% LAD stenosis in 1995 treated with PTCA;  b. 09/2009 CABG x 4: LIMA->LAD, VG->Diag, VG->OM, VG->PDA;  c. 11/2011 Cath: 3VD, 4/4 patent grafts, EF 70%.   Chronic constipation    Colonic polyp    Complete heart block ([place])    a. 11/2011 s/p MDT Adapta L ADDRL 1 Ser # [id number] H.   Complication of anesthesia    PROBLEMS VOIDING AFTER PREVIOUS SURGERIES   Diabetes mellitus without complication ([place])    NOT ON ANY DIABETIC MEDICATIONS   Fasting hyperglycemia    GERD (gastroesophageal reflux disease)    Hyperlipidemia    Hypertension    Pacemaker JULY 2013   DR.  G.TAYLOR AND DR. Clearnce Hasten   Pseudophakia of both eyes    Seizures ([place])    a. prior to diagnosis of heart block and syncope, this was in the differential and pt was briefly on Keppra, started by ER MD.  Syncope    a. 11/2011 18 second pauses noted on Event Monitor assoc w/ syncope   Trauma 1952   Abdominal and chest in 1952; multiple injuries including pelvic fracture, rib fractures and ruptured bladder   Urinary retention    PT HAS INDWELLING FOLEY CATHETER   Past Surgical History:  Procedure Laterality Date   BIOPSY  07/05/2019   Procedure: BIOPSY;  Surgeon: Rogene Houston, MD;  [facility];  Service: Endoscopy;;  gastric   BLADDER SURGERY  1950'S   CATARACT EXTRACTION W/PHACO Right 11/22/2012    Procedure: CATARACT EXTRACTION PHACO AND INTRAOCULAR LENS PLACEMENT ([place]);  Surgeon: Elta Guadeloupe T. Gershon Crane, MD;  Location: [facility];  Service: Ophthalmology;  Laterality: Right;  CDE 10.27   CATARACT EXTRACTION W/PHACO Left 12/20/2012   Procedure: CATARACT EXTRACTION PHACO AND INTRAOCULAR LENS PLACEMENT (I[facility]);  Surgeon: Elta Guadeloupe T. Gershon Crane, MD;  Location: [facility];  Service: Ophthalmology;  Laterality: Left;  CDE:6.67   COLONOSCOPY W/ POLYPECTOMY     CORONARY ARTERY BYPASS GRAFT  09/2009   4 vessels   ESOPHAGEAL DILATION  07/05/2019   Procedure: ESOPHAGEAL DILATION;  Surgeon: Rogene Houston, MD;  [facility];  Service: Endoscopy;;   ESOPHAGOGASTRODUODENOSCOPY (EGD) WITH PROPOFOL N/A 07/05/2019   Procedure: ESOPHAGOGASTRODUODENOSCOPY (EGD) WITH PROPOFOL;  Surgeon: Rogene Houston, MD;  [facility];  Service: Endoscopy;  Laterality: N/A;  110-office notified pt new arrival time 10:45am   FEMORAL HERNIA REPAIR     INSERTION OF SUPRAPUBIC CATHETER  04/04/2012   Procedure: INSERTION OF SUPRAPUBIC CATHETER;  Surgeon: Claybon Jabs, MD;  [facility]: [facility];  Service: Urology;  Laterality: N/A;   LEAD REVISION N/A 12/30/2011   Procedure: LEAD REVISION;  Surgeon: Evans Lance, MD;  Location: [facility] CATH LAB;  Service: Cardiovascular;  Laterality: N/A;   LEFT HEART CATHETERIZATION WITH CORONARY/GRAFT ANGIOGRAM  12/29/2011   Procedure: LEFT HEART CATHETERIZATION WITH Beatrix Fetters;  Surgeon: Sherren Mocha, MD;  Location: [facility] CATH LAB;  Service: Cardiovascular;;   PACEMAKER PLACEMENT     TEMPORARY PACEMAKER INSERTION N/A 12/29/2011   Procedure: TEMPORARY PACEMAKER INSERTION;  Surgeon: Sherren Mocha, MD;  Location: [facility] CATH LAB;  Service: Cardiovascular;  Laterality: N/A;   TRANSURETHRAL PROSTATECTOMY WITH GYRUS INSTRUMENTS  04/04/2012   Procedure: TRANSURETHRAL PROSTATECTOMY WITH GYRUS INSTRUMENTS;  Surgeon: Claybon Jabs, MD;  [facility];  Service: Urology;  Laterality: N/A;  TURP with  Gyrus and Suprapubic Tube Placement   YAG LASER APPLICATION Left [id number]   Procedure: YAG LASER APPLICATION;  Surgeon: Rutherford Guys, MD;  Location: [facility];  Service: Ophthalmology;  Laterality: Left;    Social History:  reports that he has never smoked. His smokeless tobacco use includes chew. He reports that he does not drink alcohol and does not use drugs.   Allergies  Allergen Reactions   Penicillins Other (See Comments)    Unknown reaction  Did it involve swelling of the face/tongue/throat, SOB, or low BP? Unknown Did it involve sudden or severe rash/hives, skin peeling, or any reaction on the inside of your mouth or nose? Unknown Did you need to seek medical attention at a hospital or doctor's office? Unknown When did it last happen?      childhood allergy If all above answers are "NO", may proceed with cephalosporin use.     Family History  Problem Relation Age of Onset   Heart block Mother    Heart murmur Mother    Aneurysm Father  Coronary artery disease Brother        x3   Lung cancer Brother      Prior to Admission medications   Medication Sig Start Date End Date Taking? Authorizing Provider  acetaminophen (TYLENOL) 650 MG CR tablet Take 650 mg by mouth every 8 (eight) hours as needed for pain or fever. 02/02/20  Yes [provider]  albuterol (VENTOLIN HFA) 108 (90 Base) MCG/ACT inhaler Inhale 2 puffs into the lungs every 6 (six) hours as needed for wheezing or shortness of breath. 07/28/22  Yes Evans Lance, MD  aspirin EC 81 MG tablet Take 1 tablet (81 mg total) by mouth every morning. 07/06/19  Yes Rehman, Mechele Dawley, MD  CONSTULOSE 10 GM/15ML solution Take 30 mLs by mouth 2 (two) times daily as needed for moderate constipation.  07/27/18  Yes [provider]  feeding supplement (ENSURE ENLIVE / ENSURE PLUS) LIQD Take 237 mLs by mouth 2 (two) times daily between meals. 03/16/22  Yes Shahmehdi, Seyed A, MD  furosemide (LASIX) 40 MG tablet Take 1  tablet (40 mg total) by mouth every other day. 08/05/22  Yes Evans Lance, MD  lisinopril (ZESTRIL) 10 MG tablet Take 1 tablet (10 mg total) by mouth 2 (two) times daily. 03/16/22 08/11/22 Yes Shahmehdi, Seyed A, MD  metoprolol tartrate (LOPRESSOR) 50 MG tablet Take 50 mg by mouth 2 (two) times daily.   Yes [provider]  MYRBETRIQ 25 MG TB24 tablet Take 25 mg by mouth daily. 06/26/22  Yes [provider]  pantoprazole (PROTONIX) 20 MG tablet TAKE ONE TABLET ('20MG'$  TOTAL) BY MOUTH TWO TIMES DAILY BEFORE A MEAL Patient taking differently: Take 20 mg by mouth 2 (two) times daily before a meal. 10/02/19  Yes Rehman, Mechele Dawley, MD  potassium chloride SA (K-DUR,KLOR-CON) 20 MEQ tablet Take 20 mEq by mouth every morning.    Yes [provider]  simvastatin (ZOCOR) 20 MG tablet Take 20 mg by mouth daily. 05/18/22  Yes [provider]  ALPRAZolam Duanne Moron) 0.5 MG tablet Take by mouth. Patient not taking: Reported on 08/11/2022 06/22/22   [provider]  QUEtiapine (SEROQUEL) 50 MG tablet Take 1 tablet (50 mg total) by mouth at bedtime. Patient not taking: Reported on 08/11/2022 03/16/22 07/28/22  Deatra James, MD  sulfamethoxazole-trimethoprim (BACTRIM DS) 800-160 MG tablet Take 1 tablet by mouth 2 (two) times daily. Patient not taking: Reported on 08/11/2022 07/27/22   [provider]  SURE COMFORT PEN NEEDLES 31G X 5 MM MISC USE AS DIRECTED UP TO 3ITIMES DAILY. 04/29/22   [provider]  tamsulosin (FLOMAX) 0.4 MG CAPS capsule Take 0.4 mg by mouth daily. Patient not taking: Reported on 08/11/2022 03/03/22   [provider]    Physical Exam: BP (!) 158/59 (BP Location: Right Arm)   Pulse 63   Temp 97.6 F (36.4 C) (Oral)   Resp 14   Ht '5\' 7"'$  (1.702 m)   Wt 70.5 kg   SpO2 99%   BMI 24.34 kg/m   General: 89 y.o. year-old male well developed well nourished in no acute distress.  Alert and oriented x3. HEENT: NCAT, EOMI Neck:  Supple, trachea medial Cardiovascular: Regular rate and rhythm with no rubs or gallops.  No thyromegaly or JVD noted.  +2 lower extremity edema bilaterally. 2/4 pulses in all 4 extremities. Respiratory: Bilateral Rales in lower lobes on auscultation.  No wheezes  Abdomen: Soft, nontender nondistended with normal bowel sounds x4 quadrants. Muskuloskeletal:  No cyanosis or clubbing noted bilaterally Neuro: CN II-XII intact, strength 5/5 x 4, sensation, reflexes intact Skin: No ulcerative lesions noted or rashes Psychiatry: Mood is appropriate for condition and setting          Labs on Admission:  Basic Metabolic Panel: Recent Labs  Lab 08/11/22 1823  NA 132*  K 4.0  CL 95*  CO2 26  GLUCOSE 123*  BUN 25*  CREATININE 1.31*  CALCIUM 9.9  MG 1.8   Liver Function Tests: Recent Labs  Lab 08/11/22 1823  AST 27  ALT 26  ALKPHOS 84  BILITOT 1.4*  PROT 7.4  ALBUMIN 4.3   No results for input(s): "LIPASE", "AMYLASE" in the last 168 hours. Recent Labs  Lab 08/11/22 1823  AMMONIA 15   CBC: Recent Labs  Lab 08/11/22 1823  WBC 9.2  NEUTROABS 6.5  HGB 13.3  HCT 40.5  MCV 89.0  PLT 187   Cardiac Enzymes: No results for input(s): "CKTOTAL", "CKMB", "CKMBINDEX", "TROPONINI" in the last 168 hours.  BNP (last 3 results) Recent Labs    03/04/22 1344 08/04/22 1554 08/11/22 1823  BNP 777.0* 947.0* 1,554.0*    ProBNP (last 3 results) No results for input(s): "PROBNP" in the last 8760 hours.  CBG: No results for input(s): "GLUCAP" in the last 168 hours.  Radiological Exams on Admission: CT Head Wo Contrast  Result Date: 08/11/2022 CLINICAL DATA:  Altered mental status EXAM: CT HEAD WITHOUT CONTRAST TECHNIQUE: Contiguous axial images were obtained from the base of the skull through the vertex without intravenous contrast. RADIATION DOSE REDUCTION: This exam was performed according to the departmental dose-optimization program which includes automated exposure control,  adjustment of the mA and/or kV according to patient size and/or use of iterative reconstruction technique. COMPARISON:  03/07/2022 FINDINGS: Brain: There is no mass, hemorrhage or extra-axial collection. There is generalized atrophy without lobar predilection. Hypodensity of the white matter is most commonly associated with chronic microvascular disease. Vascular: Atherosclerotic calcification of the vertebral and internal carotid arteries at the skull base. No abnormal hyperdensity of the major intracranial arteries or dural venous sinuses. Skull: The visualized skull base, calvarium and extracranial soft tissues are normal. Sinuses/Orbits: No fluid levels or advanced mucosal thickening of the visualized paranasal sinuses. No mastoid or middle ear effusion. The orbits are normal. IMPRESSION: 1. No acute intracranial abnormality. 2. Generalized atrophy and findings of chronic microvascular disease. Electronically Signed   By: Ulyses Jarred M.D.   On: 08/11/2022 19:12   DG Chest Portable 1 View  Result Date: 08/11/2022 CLINICAL DATA:  Heart failure.  Hematoma and bruising to left arm. EXAM: PORTABLE CHEST 1 VIEW COMPARISON:  Chest radiographs 08/04/2022 and 03/14/2022 FINDINGS: Status post median sternotomy and CABG. Cardiac silhouette again appears mildly enlarged. Left chest wall cardiac pacer is seen with leads overlying the right atrium and right ventricle. Normal variant azygous lobe is again noted. There is again moderate elevation of the right hemidiaphragm. Mild right pleural effusion again obscures the elevated right hemidiaphragm. There is subtle increased pleural fluid tracking up the lateral right fissure compared to 08/04/2022. No definitive left pleural effusion, noting a tiny left pleural effusion on 08/04/2022. No pneumothorax. Moderate bilateral interstitial thickening is similar to prior. No acute skeletal abnormality. IMPRESSION: 1. Moderate bilateral interstitial thickening is similar to prior  and may be due to pulmonary edema. 2. Mild right pleural effusion, slightly increased from 08/04/2022. Electronically Signed   By: Yvonne Kendall M.D.   On: 08/11/2022 18:39  DG Forearm Left  Result Date: 08/11/2022 CLINICAL DATA:  Fall. Two falls today out of wheelchair. Large hematoma and bruising to left arm. EXAM: LEFT ELBOW - 2 VIEW; LEFT FOREARM - 2 VIEW COMPARISON:  None Available. FINDINGS: Left elbow: No definite joint effusion. No acute fracture line is seen. Normal alignment. Mild elbow joint space narrowing. Mild vascular calcifications. Left forearm: No acute fracture is seen within the radius or ulna. Normal alignment at the wrist. Moderate vascular calcifications. IMPRESSION: 1. No acute fracture of the left elbow or forearm. 2. Mild elbow osteoarthritis. Electronically Signed   By: Yvonne Kendall M.D.   On: 08/11/2022 18:32   DG Elbow 2 Views Left  Result Date: 08/11/2022 CLINICAL DATA:  Fall. Two falls today out of wheelchair. Large hematoma and bruising to left arm. EXAM: LEFT ELBOW - 2 VIEW; LEFT FOREARM - 2 VIEW COMPARISON:  None Available. FINDINGS: Left elbow: No definite joint effusion. No acute fracture line is seen. Normal alignment. Mild elbow joint space narrowing. Mild vascular calcifications. Left forearm: No acute fracture is seen within the radius or ulna. Normal alignment at the wrist. Moderate vascular calcifications. IMPRESSION: 1. No acute fracture of the left elbow or forearm. 2. Mild elbow osteoarthritis. Electronically Signed   By: Yvonne Kendall M.D.   On: 08/11/2022 18:32    EKG: I independently viewed the EKG done and my findings are as followed: Paced rhythm at a rate of 67 bpm  Assessment/Plan Present on Admission:  Acute respiratory failure with hypoxia (HCC)  CAD (coronary artery disease)  Complete heart block ([place])  Essential hypertension  Mixed hyperlipidemia  Principal Problem:   Acute on chronic diastolic CHF (congestive heart failure) (HCC) Active  Problems:   DM (diabetes mellitus) ([place])   Essential hypertension   Complete heart block (HCC)   Mixed hyperlipidemia   CAD (coronary artery disease)   Acute respiratory failure with hypoxia ([place])   Fall at home, initial encounter   Arm bruise, left, initial encounter   AKI (acute kidney injury) ([place])   GERD (gastroesophageal reflux disease)  Acute on chronic diastolic congestive heart failure Patient complained of increasing shortness of breath, orthopnea Chest x-ray was suggestive of pulmonary edema Continue total input/output, daily weights and fluid restriction Continue IV Lasix 40 mg twice daily Continue aspirin, lisinopril, Lopressor, Zocor Continue heart healthy/modified carb diet  Echocardiogram done on 03/05/2022 showed LVEF of 50 to 55%.  Hypokinesis of the mid/distal inferoseptal wall.  Severe asymmetric LVH.  LV diastolic parameters were indeterminate.  RV systolic function mildly reduced.  Mildly elevated PASP.  Echocardiogram will be done in the morning   Acute respiratory failure with hypoxia Patient became hypoxic on room air with an O2 sat of 86%, supplemental oxygen at 2 L via [place] was provided.  Continue supplemental oxygen to maintain O2 sat > 92% with plan to wean patient off this as tolerated.  Fall at home Continue fall precaution Continue PT/OT eval and treat  Left arm bruise and hematoma Continue wound care  Acute kidney injury BUN/creatinine 25/1.31 (baseline creatinine at 0.8-1.0) Renally adjust medications, avoid nephrotoxic agents/dehydration/hypotension  CAD s/p CABG Continue aspirin, Zocor, Lopressor  complete heart block status post pacemaker placement Patient has a pacemaker which was lastly reviewed on 07/30/2022 -leads and battery stable for patient  GERD Continue Protonix  Essential hypertension Continue Lopressor, lisinopril  Mixed hyperlipidemia Continue Zocor  T2DM Last A1c on 03/05/2022 was 6.5 Continue ISS and hypoglycemia  protocol  DVT prophylaxis: Lovenox  Advance Care Planning: CODE STATUS: Limited code  Consults: None  Family Communication: None at bedside  Severity of Illness: The appropriate patient status for this patient is INPATIENT. Inpatient status is judged to be reasonable and necessary in order to provide the required intensity of service to ensure the patient's safety. The patient's presenting symptoms, physical exam findings, and initial radiographic and laboratory data in the context of their chronic comorbidities is felt to place them at high risk for further clinical deterioration. Furthermore, it is not anticipated that the patient will be medically stable for discharge from the hospital within 2 midnights of admission.   * I certify that at the point of admission it is my clinical judgment that the patient will require inpatient hospital care spanning beyond 2 midnights from the point of admission due to high intensity of service, high risk for further deterioration and high frequency of surveillance required.*  Author: Bernadette Hoit, DO 08/12/2022 12:44 AM  For on call review [email or web address].

## 2022-08-12 ENCOUNTER — Inpatient Hospital Stay (HOSPITAL_COMMUNITY): Payer: Medicare PPO

## 2022-08-12 ENCOUNTER — Other Ambulatory Visit (HOSPITAL_COMMUNITY): Payer: Self-pay | Admitting: *Deleted

## 2022-08-12 ENCOUNTER — Other Ambulatory Visit (HOSPITAL_COMMUNITY): Payer: 59

## 2022-08-12 DIAGNOSIS — N179 Acute kidney failure, unspecified: Secondary | ICD-10-CM | POA: Insufficient documentation

## 2022-08-12 DIAGNOSIS — S40022A Contusion of left upper arm, initial encounter: Secondary | ICD-10-CM | POA: Insufficient documentation

## 2022-08-12 DIAGNOSIS — I5031 Acute diastolic (congestive) heart failure: Secondary | ICD-10-CM

## 2022-08-12 DIAGNOSIS — I5033 Acute on chronic diastolic (congestive) heart failure: Secondary | ICD-10-CM

## 2022-08-12 DIAGNOSIS — W19XXXA Unspecified fall, initial encounter: Secondary | ICD-10-CM

## 2022-08-12 DIAGNOSIS — K219 Gastro-esophageal reflux disease without esophagitis: Secondary | ICD-10-CM | POA: Insufficient documentation

## 2022-08-12 LAB — CBC
HCT: 36.2 % — ABNORMAL LOW (ref 39.0–52.0)
Hemoglobin: 11.8 g/dL — ABNORMAL LOW (ref 13.0–17.0)
MCH: 29.1 pg (ref 26.0–34.0)
MCHC: 32.6 g/dL (ref 30.0–36.0)
MCV: 89.2 fL (ref 80.0–100.0)
Platelets: 179 10*3/uL (ref 150–400)
RBC: 4.06 MIL/uL — ABNORMAL LOW (ref 4.22–5.81)
RDW: 14.6 % (ref 11.5–15.5)
WBC: 9.2 10*3/uL (ref 4.0–10.5)
nRBC: 0 % (ref 0.0–0.2)

## 2022-08-12 LAB — PHOSPHORUS: Phosphorus: 2.7 mg/dL (ref 2.5–4.6)

## 2022-08-12 LAB — COMPREHENSIVE METABOLIC PANEL
ALT: 22 U/L (ref 0–44)
AST: 23 U/L (ref 15–41)
Albumin: 3.7 g/dL (ref 3.5–5.0)
Alkaline Phosphatase: 69 U/L (ref 38–126)
Anion gap: 11 (ref 5–15)
BUN: 22 mg/dL (ref 8–23)
CO2: 27 mmol/L (ref 22–32)
Calcium: 9.7 mg/dL (ref 8.9–10.3)
Chloride: 95 mmol/L — ABNORMAL LOW (ref 98–111)
Creatinine, Ser: 1.12 mg/dL (ref 0.61–1.24)
GFR, Estimated: >60 mL/min (ref >60)
Glucose, Bld: 82 mg/dL (ref 70–99)
Potassium: 3.1 mmol/L — ABNORMAL LOW (ref 3.5–5.1)
Sodium: 133 mmol/L — ABNORMAL LOW (ref 135–145)
Total Bilirubin: 1.2 mg/dL (ref 0.3–1.2)
Total Protein: 6.4 g/dL — ABNORMAL LOW (ref 6.5–8.1)

## 2022-08-12 LAB — ECHOCARDIOGRAM COMPLETE
Area-P 1/2: 5.38 cm2
Height: 67 in
P 1/2 time: 342 msec
S' Lateral: 3.4 cm
Weight: 2493.84 oz

## 2022-08-12 LAB — GLUCOSE, CAPILLARY
Glucose-Capillary: 90 mg/dL (ref 70–99)
Glucose-Capillary: 130 mg/dL — ABNORMAL HIGH (ref 70–99)
Glucose-Capillary: 177 mg/dL — ABNORMAL HIGH (ref 70–99)
Glucose-Capillary: 159 mg/dL — ABNORMAL HIGH (ref 70–99)

## 2022-08-12 LAB — MAGNESIUM: Magnesium: 1.8 mg/dL (ref 1.7–2.4)

## 2022-08-12 MED ORDER — METOPROLOL TARTRATE 50 MG PO TABS
50.0000 mg | ORAL_TABLET | Freq: Two times a day (BID) | ORAL | Status: DC
  Administered 2022-08-12 – 2022-08-14 (×5): 50 mg via ORAL
  Filled 2022-08-12 (×5): qty 1

## 2022-08-12 MED ORDER — ASPIRIN 81 MG PO TBEC
81.0000 mg | DELAYED_RELEASE_TABLET | Freq: Every morning | ORAL | Status: DC
  Administered 2022-08-12 – 2022-08-14 (×3): 81 mg via ORAL
  Filled 2022-08-12 (×3): qty 1

## 2022-08-12 MED ORDER — ACETAMINOPHEN 325 MG PO TABS
650.0000 mg | ORAL_TABLET | Freq: Four times a day (QID) | ORAL | Status: DC | PRN
  Administered 2022-08-12: 650 mg via ORAL
  Filled 2022-08-12: qty 2

## 2022-08-12 MED ORDER — ACETAMINOPHEN 650 MG RE SUPP: 650.0000 mg | Freq: Four times a day (QID) | RECTAL | Status: DC | PRN

## 2022-08-12 MED ORDER — POTASSIUM CHLORIDE CRYS ER 20 MEQ PO TBCR
40.0000 meq | EXTENDED_RELEASE_TABLET | Freq: Once | ORAL | Status: AC
  Administered 2022-08-12: 40 meq via ORAL
  Filled 2022-08-12: qty 2

## 2022-08-12 MED ORDER — ONDANSETRON HCL 4 MG PO TABS: 4.0000 mg | ORAL_TABLET | Freq: Four times a day (QID) | ORAL | Status: DC | PRN

## 2022-08-12 MED ORDER — PANTOPRAZOLE SODIUM 40 MG PO TBEC
40.0000 mg | DELAYED_RELEASE_TABLET | Freq: Two times a day (BID) | ORAL | Status: DC
  Administered 2022-08-12 – 2022-08-14 (×5): 40 mg via ORAL
  Filled 2022-08-12 (×5): qty 1

## 2022-08-12 MED ORDER — LISINOPRIL 10 MG PO TABS
10.0000 mg | ORAL_TABLET | Freq: Every day | ORAL | Status: DC
  Administered 2022-08-12 – 2022-08-14 (×3): 10 mg via ORAL
  Filled 2022-08-12 (×3): qty 1

## 2022-08-12 MED ORDER — SIMVASTATIN 20 MG PO TABS
20.0000 mg | ORAL_TABLET | Freq: Every day | ORAL | Status: DC
  Administered 2022-08-12 – 2022-08-14 (×3): 20 mg via ORAL
  Filled 2022-08-12 (×3): qty 1

## 2022-08-12 MED ORDER — ONDANSETRON HCL 4 MG/2ML IJ SOLN: 4.0000 mg | Freq: Four times a day (QID) | INTRAMUSCULAR | Status: DC | PRN

## 2022-08-12 MED ORDER — INSULIN ASPART 100 UNIT/ML IJ SOLN: 0.0000 [IU] | Freq: Three times a day (TID) | INTRAMUSCULAR | Status: DC

## 2022-08-12 MED ORDER — ENOXAPARIN SODIUM 40 MG/0.4ML IJ SOSY
40.0000 mg | PREFILLED_SYRINGE | INTRAMUSCULAR | Status: DC
  Administered 2022-08-12 – 2022-08-14 (×3): 40 mg via SUBCUTANEOUS
  Filled 2022-08-12 (×3): qty 0.4

## 2022-08-12 MED ORDER — FUROSEMIDE 10 MG/ML IJ SOLN
40.0000 mg | Freq: Two times a day (BID) | INTRAMUSCULAR | Status: DC
  Administered 2022-08-12 (×2): 40 mg via INTRAVENOUS
  Filled 2022-08-12 (×2): qty 4

## 2022-08-12 NOTE — TOC Initial Note (Signed)
Transition of Care [facility]) - Initial/Assessment Note    Patient Details  Name: Guy Roberson MRN: [id number] Date of Birth: Mar 20, 1932  Transition of Care [facility]) CM/SW Contact:    Iona Beard, [place] Phone Number: 08/12/2022, 1:50 PM  Clinical Narrative:                 PT is recommending SNF for pt at D/C. CSW spoke with pts daughter in law. Pts DIL states that if pt needs SNF they are all agreeable to this. They would like referral sent to [facility] completed Fl2 and sent referral to facilities. TOC to follow.   Expected Discharge Plan: Skilled Nursing Facility Barriers to Discharge: Continued Medical Work up   Patient Goals and CMS Choice Patient states their goals for this hospitalization and ongoing recovery are:: go to SNF if needed CMS Medicare.gov Compare Post Acute Care list provided to:: Patient Represenative (must comment) Choice offered to / list presented to : Adult Children, [place] ownership interest in [facility].provided to:: Patient    Expected Discharge Plan and Services In-house Referral: Clinical Social Work Discharge Planning Services: CM Consult Post Acute Care Choice: [place] Living arrangements for the past 2 months: Single Family Home                                      Prior Living Arrangements/Services Living arrangements for the past 2 months: Single Family Home Lives with:: Spouse Patient language and need for interpreter reviewed:: Yes Do you feel safe going back to the place where you live?: Yes      Need for Family Participation in Patient Care: Yes (Comment) Care giver support system in place?: Yes (comment)   Criminal Activity/Legal Involvement Pertinent to Current Situation/Hospitalization: No - Comment as needed  Activities of Daily Living Home Assistive Devices/Equipment: Wheelchair ADL Screening (condition at time of admission) Patient's cognitive ability adequate to  safely complete daily activities?: No Is the patient deaf or have difficulty hearing?: Yes Does the patient have difficulty seeing, even when wearing glasses/contacts?: No Does the patient have difficulty concentrating, remembering, or making decisions?: Yes Patient able to express need for assistance with ADLs?: Yes Does the patient have difficulty dressing or bathing?: Yes Independently performs ADLs?: No Communication: Independent Dressing (OT): Needs assistance Is this a change from baseline?: Pre-admission baseline Grooming: Needs assistance Is this a change from baseline?: Pre-admission baseline Feeding: Independent Bathing: Needs assistance Is this a change from baseline?: Pre-admission baseline Toileting: Needs assistance Is this a change from baseline?: Pre-admission baseline In/Out Bed: Needs assistance Is this a change from baseline?: Pre-admission baseline Walks in Home: Needs assistance Is this a change from baseline?: Pre-admission baseline Does the patient have difficulty walking or climbing stairs?: Yes Weakness of Legs: Both Weakness of Arms/Hands: None  Permission Sought/Granted                  Emotional Assessment         Alcohol / Substance Use: Not Applicable Psych Involvement: No (comment)  Admission diagnosis:  Acute exacerbation of CHF (congestive heart failure) (HCC) [I50.9] Acute congestive heart failure, unspecified heart failure type [facility]) [I50.9] Patient Active Problem List   Diagnosis Date Noted   Acute on chronic diastolic CHF (congestive heart failure) ([place]) 08/12/2022   Fall at home, initial encounter 08/12/2022   Arm bruise, left, initial encounter  08/12/2022   AKI (acute kidney injury) ([place]) 08/12/2022   GERD (gastroesophageal reflux disease) 08/12/2022   Generalized weakness 03/04/2022   COVID-19 virus infection 03/04/2022   Acute congestive heart failure ([place]) 03/04/2022   Hypokalemia 03/04/2022   Gross hematuria 07/07/2021    Benign prostatic hyperplasia with urinary retention 07/07/2021   Bladder diverticulum 07/07/2021   Neurogenic bladder 07/07/2021   Urinary retention 01/01/2012    Class: Present on Admission   Pacemaker-[employer] 12/30/2011   Acute respiratory failure with hypoxia ([place]) 12/29/2011   Rib fracture 12/29/2011   Hypoxemia 12/29/2011   Syncope    Complete heart block (HCC)    Hypertension    Mixed hyperlipidemia    CAD (coronary artery disease)    Arteriosclerotic cardiovascular disease (ASCVD)    Fasting hyperglycemia    Colonic polyp    DM (diabetes mellitus) ([place]) 10/02/2009   HYPERLIPIDEMIA 09/30/2009   Essential hypertension 09/30/2009   PCP:  [name], MD Pharmacy:   [facility], [place] [address] Phone: [phone] Fax: [phone]     Social Determinants of Health ([place]) Social History: SDOH Screenings   Food Insecurity: No Food Insecurity (08/11/2022)  Housing: Low Risk  (08/11/2022)  Transportation Needs: No Transportation Needs (08/11/2022)  Utilities: Not At Risk (08/11/2022)  Tobacco Use: High Risk (08/11/2022)   SDOH Interventions:     Readmission Risk Interventions    08/12/2022    1:48 PM 03/06/2022    2:29 PM  Readmission Risk Prevention Plan  Transportation Screening Complete Complete  Home Care Screening  Complete  Medication Review (RN CM)  Complete  HRI or Home Care Consult Complete   Social Work Consult for [place] Planning/Counseling Complete   Palliative Care Screening Not Applicable   Medication Review Press photographer) Complete

## 2022-08-12 NOTE — Evaluation (Signed)
Occupational Therapy Evaluation Patient Details Name: Guy Roberson MRN: [id number] DOB: 09-25-1931 Today's Date: 08/12/2022   History of Present Illness [name], [name] is a 89 y.o. male with medical history significant of hypertension, hyperlipidemia, CAD s/p CABG, complete heart block status post pacemaker placement, GERD, T2DM who presents to the emergency department due to confusion.  Patient was unable to provide history, history was obtained from ED physician and ED medical record.  Per report, patient was reported to have had intermittent confusion within the last few weeks.  His dose of diuretic was recently decreased and patient has since started to develop worsening shortness of breath to the extent that he has started to use wife's oxygen at home, shortness of breath worsens when laying flat in bed.  Patient was recently started on sulfa antibiotics due to concern for UTI, but this was stopped after the onset of confusion since it was suspected that the sulfa antibiotic may have been contributing to patient's confusion.  Patient was reported to have been unsteady and sustained falls few days ago whereby he hit both elbows and the most recent fall being today whereby he fell and landed on his left forearm.  He denied head trauma, chest pain, fever, chills."   Clinical Impression   Pt agreeable to OT and PT co-evaluation. Pt not oriented to place or year. Lethargic at first with some increase in arousal. Moderate difficulty following commands today. Pt required mod A for bed mobility and min to mod for transfer to chair. Pt B UE mobility was difficult to assess due to cognitive issues. Pt is generally weak and needing assist for mobility and ADL's at this time. Cognition is likely limiting functional status as well. Pt left in chair with chair alarm set. Pt will benefit from continued OT in the hospital and recommended venue below to increase strength, balance, and endurance for  safe ADL's.         Recommendations for follow up therapy are one component of a multi-disciplinary discharge planning process, led by the attending physician.  Recommendations may be updated based on patient status, additional functional criteria and insurance authorization.   Follow Up Recommendations  Skilled nursing-short term rehab (<3 hours/day)     Assistance Recommended at Discharge Frequent or constant Supervision/Assistance  Patient can return home with the following A lot of help with walking and/or transfers;A lot of help with bathing/dressing/bathroom;Assistance with cooking/housework;Direct supervision/assist for medications management;Assist for transportation;Help with stairs or ramp for entrance    Functional Status Assessment  Patient has had a recent decline in their functional status and demonstrates the ability to make significant improvements in function in a reasonable and predictable amount of time.  Equipment Recommendations  None recommended by OT       Precautions / Restrictions Precautions Precautions: Fall Restrictions Weight Bearing Restrictions: No      Mobility Bed Mobility Overal bed mobility: Needs Assistance Bed Mobility: Supine to Sit     Supine to sit: Mod assist     General bed mobility comments: increased time, labored movment    Transfers Overall transfer level: Needs assistance Equipment used: Rolling walker (2 wheels) Transfers: Sit to/from Stand, Bed to chair/wheelchair/BSC Sit to Stand: Min guard, Min assist     Step pivot transfers: Min assist, Mod assist     General transfer comment: unsteady labored movement      Balance     Sitting balance-Leahy Scale: Fair       Standing balance-Leahy  Scale: Poor                             ADL either performed or assessed with clinical judgement   ADL Overall ADL's : Needs assistance/impaired Eating/Feeding: Set up;Minimal assistance;Sitting   Grooming:  Minimal assistance;Moderate assistance;Sitting   Upper Body Bathing: Minimal assistance;Moderate assistance;Sitting   Lower Body Bathing: Maximal assistance;Bed level   Upper Body Dressing : Minimal assistance;Moderate assistance;Sitting   Lower Body Dressing: Maximal assistance;Bed level   Toilet Transfer: Moderate assistance;Minimal assistance;Stand-pivot;Rolling walker (2 wheels) Toilet Transfer Details (indicate cue type and reason): Simualted via EOB to chair transfer. Toileting- Clothing Manipulation and Hygiene: Moderate assistance;Sitting/lateral lean       Functional mobility during ADLs: Moderate assistance;Rolling walker (2 wheels) General ADL Comments: Most ADL's based on clinical judgement due to pt's difficulty following commands for evaluation.     Vision Patient Visual Report:  (Unsure of visual history. Pt was  unable to provide history today.) Vision Assessment?:  (Further assessment needed.)                Pertinent Vitals/Pain Pain Assessment Pain Assessment: No/denies pain     Hand Dominance Left   Extremity/Trunk Assessment Upper Extremity Assessment Upper Extremity Assessment: Difficult to assess due to impaired cognition (Able to grasp RW.)   Lower Extremity Assessment Lower Extremity Assessment: Defer to PT evaluation RLE Deficits / Details: Per pt since Cva in January   Cervical / Trunk Assessment Cervical / Trunk Assessment: Kyphotic   Communication Communication Communication: HOH   Cognition Arousal/Alertness: Awake/alert Behavior During Therapy: Agitated Overall Cognitive Status: No family/caregiver present to determine baseline cognitive functioning                                                        Home Living Family/patient expects to be discharged to:: Private residence Living Arrangements: Other (Comment) Available Help at Discharge: Family Type of Home: House Home Access: Ramped entrance      Home Layout: One level     Bathroom Shower/Tub: Occupational psychologist: Handicapped height Bathroom Accessibility: Yes   Home Equipment: Rollator (4 wheels);Cane - single point   Additional Comments: Per chart review due to pt's lack or orientation today.      Prior Functioning/Environment Prior Level of Function : Independent/Modified Independent             Mobility Comments: states household ambulation with rollator ADLs Comments: Able to complete basic ADL's but with difficulty. Per recent documentation.        OT Problem List: Decreased strength;Decreased range of motion;Decreased activity tolerance;Impaired balance (sitting and/or standing);Decreased cognition;Decreased safety awareness      OT Treatment/Interventions: Self-care/ADL training;Therapeutic exercise;Therapeutic activities;Patient/family education;Balance training    OT Goals(Current goals can be found in the care plan section) Acute Rehab OT Goals Patient Stated Goal: return home OT Goal Formulation: With patient Time For Goal Achievement: 08/26/22 Potential to Achieve Goals: Good  OT Frequency: Min 2X/week    Co-evaluation PT/OT/SLP Co-Evaluation/Treatment: Yes Reason for Co-Treatment: To address functional/ADL transfers   OT goals addressed during session: ADL's and self-care                       End of Session Equipment Utilized  During Treatment: Rolling walker (2 wheels)  Activity Tolerance: Patient tolerated treatment well Patient left: in chair;with call bell/phone within reach;with chair alarm set  OT Visit Diagnosis: Unsteadiness on feet (R26.81);Other abnormalities of gait and mobility (R26.89);Muscle weakness (generalized) (M62.81)                Time: [id number] OT Time Calculation (min): 13 min Charges:  OT General Charges $OT Visit: 1 Visit OT Evaluation $OT Eval Low Complexity: 1 Low  Garnett Rekowski OT, MOT  Larey Seat 08/12/2022,  10:46 AM

## 2022-08-12 NOTE — Plan of Care (Signed)
  Problem: Acute Rehab PT Goals(only PT should resolve) Goal: Pt Will Go Supine/Side To Sit Outcome: Progressing Flowsheets (Taken 08/12/2022 1029) Pt will go Supine/Side to Sit:  with min guard assist  with supervision Goal: Patient Will Transfer Sit To/From Stand Outcome: Progressing Flowsheets (Taken 08/12/2022 1029) Patient will transfer sit to/from stand:  with supervision  with min guard assist Goal: Pt Will Transfer Bed To Chair/Chair To Bed Outcome: Progressing Flowsheets (Taken 08/12/2022 1029) Pt will Transfer Bed to Chair/Chair to Bed:  with supervision  min guard assist Goal: Pt Will Ambulate Outcome: Progressing Flowsheets (Taken 08/12/2022 1029) Pt will Ambulate:  50 feet  with min guard assist  with minimal assist  with rolling walker  10:30 AM, 08/12/22 Lonell Grandchild, MPT Physical Therapist with [facility] [phone] office [phone] mobile phone

## 2022-08-12 NOTE — Progress Notes (Signed)
[facility] Progress Note Patient: Guy Roberson [id number] DOB: 1932-01-16 DOA: 08/11/2022  DOS: the patient was seen and examined on 08/12/2022  Brief hospital course: PMH of HTN, HLD, CAD SP CABG, CHB SP pace maker implant, GERD, type II DM presented to hospital with complaints of confusion, as well as shortness of breath. Found to have acute on chronic diastolic CHF.  Currently receiving IV diuresis. Assessment and Plan: Acute on chronic diastolic CHF. Chest x-ray congestion. Bilateral crackles on examination as well as lower extremity edema. Currently receiving IV Lasix 40 mg twice daily which I will continue. No chest pain or chest tightness reported by the patient. No further workup so far.  Echocardiogram currently pending.  Acute encephalopathy. Metabolic in nature in the setting of hypoxia. Currently on 2 LPM. Mentation improving.  No focal deficit at the time of my evaluation.  CT of the head negative for any acute stroke.  Fall at home. Mechanical fall reported by the patient. X-ray of the left elbow, forearm negative for any acute fracture. Patient does have hematoma on his left forearm. PT OT recommending SNF.  Bilateral lower extremity edema left more than right. Chronic in nature. Lower extremity Doppler negative for any DVT.  HTN. Blood pressure stable for now. Patient is on lisinopril, metoprolol which I will continue.  GERD. Continue PPI twice daily.  BPH. Patient is on Flomax as well as Myrbetriq. Continuing Flomax for now.  HLD. Continuing statin.  Mood disorder. Patient is reported to be on Seroquel at home currently on hold.  Mild hypokalemia. Currently being replaced.  Subjective: No nausea no vomiting.  No fever no chills.  Continues to have shortness of breath as well as mild cough.  Has swelling in the legs as well.  Hard of hearing.  Physical Exam: General: in Mild distress, No Rash Cardiovascular: S1 and S2 Present, No  Murmur Respiratory: Good respiratory effort, Bilateral Air entry present.  Bilateral crackles, No wheezes Abdomen: Bowel Sound present, No tenderness Extremities: Left more than right bilateral edema Neuro: Alert and oriented x3, no new focal deficit  Data Reviewed: I have Reviewed nursing notes, Vitals, and Lab results. Since last encounter, pertinent lab results CBC and BMP   . I have ordered test including CBC and BMP  . I have ordered imaging lower extremity Doppler  .   Disposition: Status is: Inpatient Remains inpatient appropriate because: Need for IV diuresis  enoxaparin (LOVENOX) injection 40 mg Start: 08/12/22 1000 SCDs Start: 08/12/22 0020   Family Communication: Son at bedside Level of care: Telemetry continue due to CHF Vitals:   08/11/22 2219 08/12/22 0129 08/12/22 0616 08/12/22 1317  BP: (!) 158/59 (!) 151/65 (!) 134/52 (!) 141/56  Pulse: 63 83 70 81  Resp: '14 18 14 18  '$ Temp: 97.6 F (36.4 C) (!) 97.5 F (36.4 C) 97.7 F (36.5 C) 98 F (36.7 C)  TempSrc: Oral Oral Oral Oral  SpO2: 99% 95% 99% 97%  Weight: 70.5 kg  70.7 kg   Height:         Author: Berle Mull, MD 08/12/2022 5:30 PM  Please look on [email or web address] to find out who is on call.

## 2022-08-12 NOTE — Plan of Care (Signed)
  Problem: Acute Rehab OT Goals (only OT should resolve) Goal: Pt. Will Perform Grooming Flowsheets (Taken 08/12/2022 1048) Pt Will Perform Grooming:  with set-up  sitting Goal: Pt. Will Perform Lower Body Bathing Flowsheets (Taken 08/12/2022 1048) Pt Will Perform Lower Body Bathing:  with min guard assist  with min assist  sitting/lateral leans Goal: Pt. Will Perform Upper Body Dressing Flowsheets (Taken 08/12/2022 1048) Pt Will Perform Upper Body Dressing:  with set-up  sitting Goal: Pt. Will Perform Lower Body Dressing Flowsheets (Taken 08/12/2022 1048) Pt Will Perform Lower Body Dressing:  with min guard assist  with min assist  sitting/lateral leans Goal: Pt. Will Transfer To Toilet Flowsheets (Taken 08/12/2022 1048) Pt Will Transfer to Toilet:  with min guard assist  stand pivot transfer Goal: Pt. Will Perform Toileting-Clothing Manipulation Flowsheets (Taken 08/12/2022 1048) Pt Will Perform Toileting - Clothing Manipulation and hygiene:  with set-up  sitting/lateral leans Goal: Pt/Caregiver Will Perform Home Exercise Program Flowsheets (Taken 08/12/2022 1048) Pt/caregiver will Perform Home Exercise Program:  Increased ROM  Increased strength  Both right and left upper extremity  Independently  Nataleah Scioneaux OT, MOT

## 2022-08-12 NOTE — Progress Notes (Signed)
*  PRELIMINARY RESULTS* Echocardiogram 2D Echocardiogram has been performed.   Guy Roberson 08/12/2022, 4:56 PM

## 2022-08-12 NOTE — NC FL2 (Signed)
[place] LEVEL OF CARE FORM     IDENTIFICATION  Patient Name: Guy Roberson Birthdate: 05/07/1932 Sex: male Admission Date (Current Location): 08/11/2022  [facility] and [place] Number:  [employer] and Address:  [place] [address] [place]      Provider Number: [id number]  Attending Physician Name and Address:  Lavina Hamman, MD  Relative Name and Phone Number:       Current Level of Care: Hospital Recommended Level of Care: [place] Prior Approval Number:    Date Approved/Denied:   PASRR Number: [id number]  Discharge Plan: SNF    Current Diagnoses: Patient Active Problem List   Diagnosis Date Noted   Acute on chronic diastolic CHF (congestive heart failure) ([place]) 08/12/2022   Fall at home, initial encounter 08/12/2022   Arm bruise, left, initial encounter 08/12/2022   AKI (acute kidney injury) ([place]) 08/12/2022   GERD (gastroesophageal reflux disease) 08/12/2022   Generalized weakness 03/04/2022   COVID-19 virus infection 03/04/2022   Acute congestive heart failure ([place]) 03/04/2022   Hypokalemia 03/04/2022   Gross hematuria 07/07/2021   Benign prostatic hyperplasia with urinary retention 07/07/2021   Bladder diverticulum 07/07/2021   Neurogenic bladder 07/07/2021   Urinary retention 01/01/2012   Pacemaker-[employer] 12/30/2011   Acute respiratory failure with hypoxia ([place]) 12/29/2011   Rib fracture 12/29/2011   Hypoxemia 12/29/2011   Syncope    Complete heart block (HCC)    Hypertension    Mixed hyperlipidemia    CAD (coronary artery disease)    Arteriosclerotic cardiovascular disease (ASCVD)    Fasting hyperglycemia    Colonic polyp    DM (diabetes mellitus) ([place]) 10/02/2009   HYPERLIPIDEMIA 09/30/2009   Essential hypertension 09/30/2009    Orientation RESPIRATION BLADDER Height & Weight     Self  O2 (2L) Incontinent, External catheter Weight: 155 lb 13.8 oz (70.7  kg) Height:  '5\' 7"'$  (170.2 cm)  BEHAVIORAL SYMPTOMS/MOOD NEUROLOGICAL BOWEL NUTRITION STATUS      Continent Diet (Heart healthy)  AMBULATORY STATUS COMMUNICATION OF NEEDS Skin   Extensive Assist Verbally Normal                       Personal Care Assistance Level of Assistance  Bathing, Feeding, Dressing Bathing Assistance: Limited assistance Feeding assistance: Independent Dressing Assistance: Limited assistance     Functional Limitations Info  Sight, Hearing, Speech Sight Info: Adequate Hearing Info: Impaired Speech Info: Adequate    SPECIAL CARE FACTORS FREQUENCY  PT (By licensed PT), OT (By licensed OT)     PT Frequency: 5 times weekly OT Frequency: 5 times weekly            Contractures Contractures Info: Not present    Additional Factors Info  Code Status, Allergies Code Status Info: DNR Allergies Info: Penicillins           Current Medications (08/12/2022):  This is the current hospital active medication list Current Facility-Administered Medications  Medication Dose Route Frequency Provider Last Rate Last Admin   acetaminophen (TYLENOL) tablet 650 mg  650 mg Oral Q6H PRN Adefeso, Oladapo, DO   650 mg at 08/12/22 1214   Or   acetaminophen (TYLENOL) suppository 650 mg  650 mg Rectal Q6H PRN Adefeso, Oladapo, DO       aspirin EC tablet 81 mg  81 mg Oral q morning Adefeso, Oladapo, DO   81 mg at 08/12/22 0845   enoxaparin (LOVENOX) injection  40 mg  40 mg Subcutaneous Q24H Adefeso, Oladapo, DO   40 mg at 08/12/22 0845   furosemide (LASIX) injection 40 mg  40 mg Intravenous BID Adefeso, Oladapo, DO   40 mg at 08/12/22 0845   insulin aspart (novoLOG) injection 0-9 Units  0-9 Units Subcutaneous TID WC Adefeso, Oladapo, DO       lisinopril (ZESTRIL) tablet 10 mg  10 mg Oral Daily Adefeso, Oladapo, DO   10 mg at 08/12/22 0845   metoprolol tartrate (LOPRESSOR) tablet 50 mg  50 mg Oral BID Adefeso, Oladapo, DO   50 mg at 08/12/22 0845   ondansetron (ZOFRAN)  tablet 4 mg  4 mg Oral Q6H PRN Adefeso, Oladapo, DO       Or   ondansetron (ZOFRAN) injection 4 mg  4 mg Intravenous Q6H PRN Adefeso, Oladapo, DO       pantoprazole (PROTONIX) EC tablet 40 mg  40 mg Oral BID AC Adefeso, Oladapo, DO   40 mg at 08/12/22 0845   simvastatin (ZOCOR) tablet 20 mg  20 mg Oral Daily Adefeso, Oladapo, DO   20 mg at 08/12/22 0845     Discharge Medications: Please see discharge summary for a list of discharge medications.  Relevant Imaging Results:  Relevant Lab Results:   Additional Information SSN: [id number] [place]

## 2022-08-12 NOTE — Evaluation (Signed)
Physical Therapy Evaluation Patient Details Name: Guy Roberson MRN: [id number] DOB: April 14, 1932 Today's Date: 08/12/2022  History of Present Illness  Per DR. Donnavon, Roberson is a 89 y.o. male with medical history significant of hypertension, hyperlipidemia, CAD s/p CABG, complete heart block status post pacemaker placement, GERD, T2DM who presents to the emergency department due to confusion.  Patient was unable to provide history, history was obtained from ED physician and ED medical record.  Per report, patient was reported to have had intermittent confusion within the last few weeks.  His dose of diuretic was recently decreased and patient has since started to develop worsening shortness of breath to the extent that he has started to use wife's oxygen at home, shortness of breath worsens when laying flat in bed.  Patient was recently started on sulfa antibiotics due to concern for UTI, but this was stopped after the onset of confusion since it was suspected that the sulfa antibiotic may have been contributing to patient's confusion.  Patient was reported to have been unsteady and sustained falls few days ago whereby he hit both elbows and the most recent fall being today whereby he fell and landed on his left forearm.  He denied head trauma, chest pain, fever, chills."   Clinical Impression  Patient presents confused and required repeated verbal/tactile cueing for participating with therapy.  Patient demonstrates slow labored movement for sitting up at bedside and limited to a few side steps before having to sit due to c/o fatigue.  Patient tolerated sitting up in chair after therapy - nursing staff notified.  Patient will benefit from continued skilled physical therapy in hospital and recommended venue below to increase strength, balance, endurance for safe ADLs and gait.          Recommendations for follow up therapy are one component of a multi-disciplinary discharge planning process, led  by the attending physician.  Recommendations may be updated based on patient status, additional functional criteria and insurance authorization.  Follow Up Recommendations Skilled nursing-short term rehab (<3 hours/day) Can patient physically be transported by private vehicle: Yes    Assistance Recommended at Discharge Intermittent Supervision/Assistance  Patient can return home with the following  A lot of help with bathing/dressing/bathroom;A lot of help with walking and/or transfers;Help with stairs or ramp for entrance;Assistance with cooking/housework    Equipment Recommendations None recommended by PT  Recommendations for Other Services       Functional Status Assessment Patient has had a recent decline in their functional status and demonstrates the ability to make significant improvements in function in a reasonable and predictable amount of time.     Precautions / Restrictions Precautions Precautions: Fall Restrictions Weight Bearing Restrictions: No      Mobility  Bed Mobility Overal bed mobility: Needs Assistance Bed Mobility: Supine to Sit     Supine to sit: Mod assist     General bed mobility comments: increased time, labored movment    Transfers Overall transfer level: Needs assistance Equipment used: Rolling walker (2 wheels) Transfers: Sit to/from Stand, Bed to chair/wheelchair/BSC Sit to Stand: Min guard, Min assist   Step pivot transfers: Min assist, Mod assist       General transfer comment: unsteady labored movement    Ambulation/Gait Ambulation/Gait assistance: Mod assist, Min assist Gait Distance (Feet): 5 Feet Assistive device: Rolling walker (2 wheels) Gait Pattern/deviations: Decreased step length - right, Decreased step length - left, Decreased stride length Gait velocity: slow     General Gait Details:  limited to a few side steps at bedside mostly due to weakness and apprehension  Stairs            Wheelchair Mobility     Modified Rankin (Stroke Patients Only)       Balance Overall balance assessment: Needs assistance Sitting-balance support: Feet supported, No upper extremity supported Sitting balance-Leahy Scale: Fair Sitting balance - Comments: seated at EOB   Standing balance support: During functional activity, Bilateral upper extremity supported Standing balance-Leahy Scale: Poor Standing balance comment: fair/poor using RW                             Pertinent Vitals/Pain Pain Assessment Pain Assessment: No/denies pain    Home Living Family/patient expects to be discharged to:: Private residence Living Arrangements: Other (Comment) Available Help at Discharge: Family Type of Home: House Home Access: Ramped entrance       Home Layout: One level Home Equipment: Rollator (4 wheels);Cane - single point      Prior Function Prior Level of Function : Independent/Modified Independent             Mobility Comments: states household ambulation with rollator ADLs Comments: information from previous admission due to patient is poor historian     Hand Dominance        Extremity/Trunk Assessment   Upper Extremity Assessment Upper Extremity Assessment: Defer to OT evaluation    Lower Extremity Assessment Lower Extremity Assessment: Generalized weakness RLE Deficits / Details: Per pt since Cva in January       Communication   Communication: HOH  Cognition Arousal/Alertness: Awake/alert Behavior During Therapy: WFL for tasks assessed/performed Overall Cognitive Status: No family/caregiver present to determine baseline cognitive functioning                                          General Comments      Exercises     Assessment/Plan    PT Assessment Patient needs continued PT services  PT Problem List Decreased strength;Decreased activity tolerance;Decreased balance;Decreased mobility       PT Treatment Interventions DME  instruction;Gait training;Stair training;Functional mobility training;Therapeutic activities;Therapeutic exercise;Patient/family education;Balance training    PT Goals (Current goals can be found in the Care Plan section)  Acute Rehab PT Goals Patient Stated Goal: not stated PT Goal Formulation: With patient Time For Goal Achievement: 08/12/22 Potential to Achieve Goals: Good    Frequency Min 3X/week     Co-evaluation               AM-PAC PT "6 Clicks" Mobility  Outcome Measure Help needed turning from your back to your side while in a flat bed without using bedrails?: A Little Help needed moving from lying on your back to sitting on the side of a flat bed without using bedrails?: A Lot Help needed moving to and from a bed to a chair (including a wheelchair)?: A Lot Help needed standing up from a chair using your arms (e.g., wheelchair or bedside chair)?: A Lot Help needed to walk in hospital room?: A Lot Help needed climbing 3-5 steps with a railing? : A Lot 6 Click Score: 13    End of Session Equipment Utilized During Treatment: Oxygen Activity Tolerance: Patient limited by fatigue Patient left: in chair;with call bell/phone within reach Nurse Communication: Mobility status PT Visit Diagnosis: Unsteadiness on  feet (R26.81);Other abnormalities of gait and mobility (R26.89);Muscle weakness (generalized) (M62.81) Pain - Right/Left: Right Pain - part of body: Leg    Time: [id number] PT Time Calculation (min) (ACUTE ONLY): 18 min   Charges:   PT Evaluation $PT Eval Low Complexity: 1 Low PT Treatments $Gait Training: 8-22 mins $Therapeutic Activity: 8-22 mins        10:26 AM, 08/12/22 Lonell Grandchild, MPT Physical Therapist with [facility] [phone] office [phone] mobile phone

## 2022-08-12 NOTE — Progress Notes (Signed)
Patient pulled out one IV this shift, New IV obtained. Patient has been confused all this shift, and would not keep telemetry leads on. Continued to monitor.

## 2022-08-12 NOTE — Progress Notes (Addendum)
Error Guy Roberson, [place] CLT [phone]

## 2022-08-13 ENCOUNTER — Inpatient Hospital Stay (HOSPITAL_COMMUNITY): Payer: Medicare PPO

## 2022-08-13 DIAGNOSIS — I5033 Acute on chronic diastolic (congestive) heart failure: Secondary | ICD-10-CM | POA: Diagnosis not present

## 2022-08-13 LAB — CBC WITH DIFFERENTIAL/PLATELET
Abs Immature Granulocytes: 0.02 10*3/uL (ref 0.00–0.07)
Basophils Absolute: 0 10*3/uL (ref 0.0–0.1)
Basophils Relative: 0 %
Eosinophils Absolute: 0.2 10*3/uL (ref 0.0–0.5)
Eosinophils Relative: 2 %
HCT: 37 % — ABNORMAL LOW (ref 39.0–52.0)
Hemoglobin: 12.1 g/dL — ABNORMAL LOW (ref 13.0–17.0)
Immature Granulocytes: 0 %
Lymphocytes Relative: 28 %
Lymphs Abs: 2 10*3/uL (ref 0.7–4.0)
MCH: 29.2 pg (ref 26.0–34.0)
MCHC: 32.7 g/dL (ref 30.0–36.0)
MCV: 89.4 fL (ref 80.0–100.0)
Monocytes Absolute: 1.1 10*3/uL — ABNORMAL HIGH (ref 0.1–1.0)
Monocytes Relative: 15 %
Neutro Abs: 4 10*3/uL (ref 1.7–7.7)
Neutrophils Relative %: 55 %
Platelets: 185 10*3/uL (ref 150–400)
RBC: 4.14 MIL/uL — ABNORMAL LOW (ref 4.22–5.81)
RDW: 14.6 % (ref 11.5–15.5)
WBC: 7.3 10*3/uL (ref 4.0–10.5)
nRBC: 0 % (ref 0.0–0.2)

## 2022-08-13 LAB — COMPREHENSIVE METABOLIC PANEL
ALT: 21 U/L (ref 0–44)
AST: 20 U/L (ref 15–41)
Albumin: 3.5 g/dL (ref 3.5–5.0)
Alkaline Phosphatase: 72 U/L (ref 38–126)
Anion gap: 9 (ref 5–15)
BUN: 28 mg/dL — ABNORMAL HIGH (ref 8–23)
CO2: 31 mmol/L (ref 22–32)
Calcium: 9.4 mg/dL (ref 8.9–10.3)
Chloride: 93 mmol/L — ABNORMAL LOW (ref 98–111)
Creatinine, Ser: 1.31 mg/dL — ABNORMAL HIGH (ref 0.61–1.24)
GFR, Estimated: 52 mL/min — ABNORMAL LOW (ref >60)
Glucose, Bld: 96 mg/dL (ref 70–99)
Potassium: 3.4 mmol/L — ABNORMAL LOW (ref 3.5–5.1)
Sodium: 133 mmol/L — ABNORMAL LOW (ref 135–145)
Total Bilirubin: 1.1 mg/dL (ref 0.3–1.2)
Total Protein: 6 g/dL — ABNORMAL LOW (ref 6.5–8.1)

## 2022-08-13 LAB — GLUCOSE, CAPILLARY
Glucose-Capillary: 122 mg/dL — ABNORMAL HIGH (ref 70–99)
Glucose-Capillary: 172 mg/dL — ABNORMAL HIGH (ref 70–99)
Glucose-Capillary: 144 mg/dL — ABNORMAL HIGH (ref 70–99)
Glucose-Capillary: 144 mg/dL — ABNORMAL HIGH (ref 70–99)

## 2022-08-13 LAB — MAGNESIUM: Magnesium: 1.8 mg/dL (ref 1.7–2.4)

## 2022-08-13 MED ORDER — FUROSEMIDE 10 MG/ML IJ SOLN
40.0000 mg | Freq: Every day | INTRAMUSCULAR | Status: DC
  Administered 2022-08-13 – 2022-08-14 (×2): 40 mg via INTRAVENOUS
  Filled 2022-08-13 (×2): qty 4

## 2022-08-13 MED ORDER — POTASSIUM CHLORIDE CRYS ER 20 MEQ PO TBCR
40.0000 meq | EXTENDED_RELEASE_TABLET | Freq: Once | ORAL | Status: AC
  Administered 2022-08-13: 40 meq via ORAL
  Filled 2022-08-13: qty 2

## 2022-08-13 NOTE — Progress Notes (Signed)
Patient slept through this shift, no complaints of pain. Continued to monitor.

## 2022-08-13 NOTE — TOC Progression Note (Signed)
Transition of Care [facility]) - Progression Note    Patient Details  Name: DIANNA BREHM MRN: [id number] Date of Birth: [birth year]  [facility] of [facility] CM/SW Contact  Salome Arnt, [place] Phone Number: 08/13/2022, 10:04 AM  Clinical Narrative: Pt's daughter-in-law accepts bed at [facility] Facility notified. CMA starting authorization.       Expected Discharge Plan: [place] Barriers to Discharge: Continued Medical Work up  Expected Discharge Plan and Services In-house Referral: Clinical Social Work Discharge Planning Services: CM Consult Post Acute Care Choice: [place] Living arrangements for the past 2 months: Single Family Home                                       Social Determinants of Health (SDOH) Interventions SDOH Screenings   Food Insecurity: No Food Insecurity (08/11/2022)  Housing: Low Risk  (08/11/2022)  Transportation Needs: No Transportation Needs (08/11/2022)  Utilities: Not At Risk (08/11/2022)  Tobacco Use: High Risk (08/11/2022)    Readmission Risk Interventions    08/12/2022    1:48 PM 03/06/2022    2:29 PM  Readmission Risk Prevention Plan  Transportation Screening Complete Complete  Home Care Screening  Complete  Medication Review (RN CM)  Complete  HRI or Home Care Consult Complete   Social Work Consult for [place] Planning/Counseling Complete   Palliative Care Screening Not Applicable   Medication Review Press photographer) Complete

## 2022-08-13 NOTE — Progress Notes (Signed)
[facility] PROGRESS NOTE  Guy Roberson (DOB: 1931/10/17) [id number] PCP: [name], MD  Brief Narrative: Guy Roberson is a 89 y.o. male with a history of HTN, HLD, CAD SP CABG, CHB SP pace maker implant, GERD, type II DM who presented to the ED on 08/11/2022 with hypoxia, shortness of breath due to acute CHF for which IV diuresis is initiated. He will require rehabilitation at SNF at the time of discharge.   Subjective: Breathing better, swelling at his baseline. No cough or fever. Son and DIL live next door and help out, they are caring for his wife with advanced dementia currently. He's needed rehab in the past before and is amenable to doing so now as well.   Objective: BP (!) 146/59   Pulse 78   Temp 98 F (36.7 C) (Oral)   Resp 20   Ht '5\' 7"'$  (1.702 m)   Wt 65.1 kg   SpO2 100%   BMI 22.48 kg/m   Gen: Elderly, frail male in no acute distress.  Pulm: Scant bibasilar crackles/diminished  CV: RRR, no JVD, +asymmetric dependent edema.  GI: Soft, NT, ND, +BS  Neuro: Alert and oriented. No new focal deficits. Ext: Warm, no deformities. Hematoma on left arm with large ecchymosis as well. Skin: No other rashes, lesions or ulcers on visualized skin   Assessment & Plan: Principal Problem:   Acute on chronic diastolic CHF (congestive heart failure) (HCC) Active Problems:   DM (diabetes mellitus) ([place])   Essential hypertension   Complete heart block (HCC)   Mixed hyperlipidemia   CAD (coronary artery disease)   Acute respiratory failure with hypoxia ([place])   Fall at home, initial encounter   Arm bruise, left, initial encounter   AKI (acute kidney injury) ([place])   GERD (gastroesophageal reflux disease)  Acute hypoxic respiratory failure due to acute on chronic diastolic CHF, hx CABG, PPM: LVEF 55-60% with mod LVH, septal flattening consistent with RV overload, PASP estimated at 75mHg, dilated IVF though unchanged from prior.  - Deescalate diuresis with creatinine  bumping. Exam seems to be improving, symptomatically improving as well.  - Monitor I/O, daily weights (70kg > 65kg, -3.5L UOP yesterday).  - Ambulatory pulse oximetry planned 3/15 in preparation for discharge. If exam and symptoms continue improvement though the patient is hypoxic, we will discharge with supplemental oxygen as he reports using his wife's O2 for some time now.  - Continue ASA, statin  AKI:  - Monitor in AM, decrease lasix to once daily.   Acute hypoxic encephalopathy:  - Mentation improving.  No focal deficit at the time of my evaluation.  CT of the head negative for any acute stroke.   Fall at home: Mechanical fall reported by the patient. X-ray of the left elbow, forearm negative for any acute fracture.  - Patient does have hematoma on his left forearm which appears stable. - PT/OT recommending SNF. TOC working on this, [employer].   Bilateral lower extremity edema left more than right: Chronic. No DVT on venous U/S.     HTN. - Continue lisinopril, metoprolol    GERD. Continue PPI twice daily.   BPH: Patient is on Flomax as well as Myrbetriq. - Continuing Flomax for now.   HLD. Continuing statin.   Mood disorder. Patient is reported to be on Seroquel at home currently on hold.   Hypokalemia: Improving with supplementation. With deescalation of diuresis, will give 40 mEq and monitor in AM.   RPatrecia Pour MD  [facility] [email or web address] 08/13/2022, 4:10 PM

## 2022-08-14 DIAGNOSIS — I5033 Acute on chronic diastolic (congestive) heart failure: Secondary | ICD-10-CM | POA: Diagnosis not present

## 2022-08-14 LAB — BASIC METABOLIC PANEL
Anion gap: 7 (ref 5–15)
BUN: 27 mg/dL — ABNORMAL HIGH (ref 8–23)
CO2: 31 mmol/L (ref 22–32)
Calcium: 9.5 mg/dL (ref 8.9–10.3)
Chloride: 94 mmol/L — ABNORMAL LOW (ref 98–111)
Creatinine, Ser: 1.13 mg/dL (ref 0.61–1.24)
GFR, Estimated: >60 mL/min (ref >60)
Glucose, Bld: 105 mg/dL — ABNORMAL HIGH (ref 70–99)
Potassium: 3.8 mmol/L (ref 3.5–5.1)
Sodium: 132 mmol/L — ABNORMAL LOW (ref 135–145)

## 2022-08-14 LAB — SARS CORONAVIRUS 2 BY RT PCR: SARS Coronavirus 2 by RT PCR: NEGATIVE

## 2022-08-14 LAB — GLUCOSE, CAPILLARY
Glucose-Capillary: 113 mg/dL — ABNORMAL HIGH (ref 70–99)
Glucose-Capillary: 161 mg/dL — ABNORMAL HIGH (ref 70–99)

## 2022-08-14 MED ORDER — FUROSEMIDE 40 MG PO TABS: 40.0000 mg | ORAL_TABLET | Freq: Every day | ORAL | 11 refills | Status: DC

## 2022-08-14 NOTE — Progress Notes (Signed)
Patient discharged to [facility]. All paperwork has been sent to [facility], report called and IV removed by previous nurse, Joy. Patient son accompanied transport.

## 2022-08-14 NOTE — Discharge Summary (Signed)
Physician Discharge Summary   Patient: Guy Roberson MRN: [id number] DOB: 1932/04/28  Admit date:     08/11/2022  Discharge date: 08/14/22  Discharge Physician: Patrecia Pour   PCP: [name], MD   Recommendations at discharge:  Suggest repeat CXR in 1 week to monitor right pleural effusion. Pt does not require supplemental oxygen and reports significant improvement in breathing. Wishes to avoid thoracentesis at this time.  Follow up with PCP in 1-2 weeks. Follow up with cardiology after discharge for ongoing management of CHF.  Monitor CBC, BMP and left forearm hematoma.   Discharge Diagnoses: Principal Problem:   Acute on chronic diastolic CHF (congestive heart failure) (HCC) Active Problems:   DM (diabetes mellitus) ([place])   Essential hypertension   Complete heart block (HCC)   Mixed hyperlipidemia   CAD (coronary artery disease)   Acute respiratory failure with hypoxia ([place])   Fall at home, initial encounter   Arm bruise, left, initial encounter   AKI (acute kidney injury) ([place])   GERD (gastroesophageal reflux disease)  Hospital Course: Guy Roberson is an 89 y.o. male with a history of HTN, HLD, CAD SP CABG, CHB SP pace maker implant, GERD, type II DM who presented to the ED on 08/11/2022 with hypoxia, shortness of breath due to acute CHF for which IV diuresis is initiated with significant improvement. He will require rehabilitation at SNF at the time of discharge. Please see details below.  Assessment and Plan: Acute hypoxic respiratory failure due to acute on chronic diastolic CHF, hx CABG, PPM: LVEF 55-60% with mod LVH, septal flattening consistent with RV overload, PASP estimated at 57mmHg, dilated IVF though unchanged from prior.  - Deescalated diuresis to lasix 40mg  IV daily from BID with creatinine bumping. Exam seems to be improving, symptomatically improving as well. With that change, creatinine has come back down though UOP remains robust with nearly 3L / 24  hours. Note he was to take lasix QOD PTA. We will continue 40mg  po daily dosing for now. - Monitor I/O, daily weights (70kg > 67.3kg, -2.8L UOP yesterday, -1.1L before 7am today).  - Ambulatory pulse oximetry showed no drop in SpO2 with exertion on day of discharge. He reports using his wife's O2 nocturnally for some time now. - Continue ASA, statin - Most recent CXR showed improved aeration consistent with exam and symptoms. There was continued pleural effusion. Thoracentesis was offered, though pt opts to forego this at this time since he feels so much better. We agreed to recommend repeat CXR after discharge to inform further decision making.    AKI: Improved once lasix dose decreased.  - Monitor BMP in the next week.     Acute hypoxic encephalopathy: No focal deficit at the time of my evaluation. CT of the head negative for any acute stroke. Appears to have resolved.   Fall at home: Mechanical fall reported by the patient. X-ray of the left elbow, forearm negative for any acute fracture.  - Patient does have hematoma on his left forearm which appears stable and will need to be monitored. - PT/OT recommending SNF which is pursued at discharge.   Bilateral lower extremity edema left more than right: Chronic. No DVT on venous U/S.     HTN. - Continue lisinopril (BID dosing PTA is not modified at discharge), metoprolol    GERD. - Continue PPI twice daily.   BPH, OAB: Patient is on Flomax as well as Myrbetriq. No changes made at discharge.   HLD. Continuing  statin.   Mood disorder. Patient is reported to be on Seroquel at home currently on hold.   Hypokalemia: Improving with supplementation. Continue PTA standing supplement and monitor at SNF.  Consultants: None Procedures performed: None  Disposition: Skilled nursing facility Diet recommendation:  Cardiac diet DISCHARGE MEDICATION: Allergies as of 08/14/2022       Reactions   Penicillins Other (See Comments)   Unknown  reaction  Did it involve swelling of the face/tongue/throat, SOB, or low BP? Unknown Did it involve sudden or severe rash/hives, skin peeling, or any reaction on the inside of your mouth or nose? Unknown Did you need to seek medical attention at a hospital or doctor's office? Unknown When did it last happen?      childhood allergy If all above answers are "NO", may proceed with cephalosporin use.        Medication List     STOP taking these medications    QUEtiapine 50 MG tablet Commonly known as: SEROQUEL       TAKE these medications    acetaminophen 650 MG CR tablet Commonly known as: TYLENOL Take 650 mg by mouth every 8 (eight) hours as needed for pain or fever.   albuterol 108 (90 Base) MCG/ACT inhaler Commonly known as: VENTOLIN HFA Inhale 2 puffs into the lungs every 6 (six) hours as needed for wheezing or shortness of breath.   aspirin EC 81 MG tablet Take 1 tablet (81 mg total) by mouth every morning.   Constulose 10 GM/15ML solution Generic drug: lactulose Take 30 mLs by mouth 2 (two) times daily as needed for moderate constipation.   feeding supplement Liqd Take 237 mLs by mouth 2 (two) times daily between meals.   furosemide 40 MG tablet Commonly known as: Lasix Take 1 tablet (40 mg total) by mouth daily. What changed: when to take this   lisinopril 10 MG tablet Commonly known as: ZESTRIL Take 1 tablet (10 mg total) by mouth 2 (two) times daily.   metoprolol tartrate 50 MG tablet Commonly known as: LOPRESSOR Take 50 mg by mouth 2 (two) times daily.   Myrbetriq 25 MG Tb24 tablet Generic drug: mirabegron ER Take 25 mg by mouth daily.   pantoprazole 20 MG tablet Commonly known as: PROTONIX TAKE ONE TABLET (20MG  TOTAL) BY MOUTH TWO TIMES DAILY BEFORE A MEAL What changed: See the new instructions.   potassium chloride SA 20 MEQ tablet Commonly known as: KLOR-CON M Take 20 mEq by mouth every morning.   simvastatin 20 MG tablet Commonly known  as: ZOCOR Take 20 mg by mouth daily.   Sure Comfort Pen Needles 31G X 5 MM Misc Generic drug: Insulin Pen Needle USE AS DIRECTED UP TO 3ITIMES DAILY.   tamsulosin 0.4 MG Caps capsule Commonly known as: FLOMAX Take 0.4 mg by mouth daily.        Contact information for follow-up providers     [name], MD Follow up.   Specialty: Family Medicine Contact information: [place] [address] [phone]         Evans Lance, MD Follow up.   Specialty: Cardiology Contact information: [place] [address] [phone]              Contact information for after-discharge care     [facility] .   Service: Skilled Nursing Contact information: [address] [place] [phone]  Discharge Exam: Filed Weights   08/12/22 0616 08/13/22 0500 08/14/22 0507  Weight: 70.7 kg 65.1 kg 67.3 kg  BP (!) 142/58 (BP Location: Right Arm)   Pulse 63   Temp (!) 97.4 F (36.3 C) (Oral)   Resp 18   Ht 5\' 7"  (1.702 m)   Wt 67.3 kg   SpO2 100%   BMI 23.24 kg/m   Gen: Elderly, frail male in no acute distress.  Pulm: Improving crackles at left base, diminished without crackles at right base. Nonlabored breathing room air. CV: RRR, no JVD, +asymmetric dependent edema improved.  GI: Soft, NT, ND, +BS  Neuro: Alert and oriented. No new focal deficits. Ext: Warm, no deformities. Hematoma on left arm with large ecchymosis as well. Overall stable-to-improving. Skin: No other rashes, lesions or ulcers on visualized skin   Condition at discharge: stable  The results of significant diagnostics from this hospitalization (including imaging, microbiology, ancillary and laboratory) are listed below for reference.   Imaging Studies: DG Chest 2 View  Result Date: 08/13/2022 CLINICAL DATA:  Pleura effusion EXAM: CHEST - 2 VIEW COMPARISON:   08/11/2022 FINDINGS: Improved aeration. Persistent moderate right pleural effusion with atelectasis/consolidation at the right lung base. Improvement in the left perihilar infiltrates or edema. There may be a small left pleural effusion. Heart size and mediastinal contours are within normal limits. CABG markers. Stable left subclavian dual lead pacemaker. Azygous fissure. Sternotomy wires. IMPRESSION: Persistent moderate right pleural effusion with right lower lobe atelectasis/consolidation. Electronically Signed   By: Lucrezia Europe M.D.   On: 08/13/2022 08:56   ECHOCARDIOGRAM COMPLETE  Result Date: 08/12/2022    ECHOCARDIOGRAM REPORT   Patient Name:   MIKKO TERUEL Date of Exam: 08/12/2022 Medical Rec #:  [id number]     Height:       67.0 in Accession #:    [id number]    Weight:       155.9 lb Date of Birth:  November 30, 1931    BSA:          1.819 m Patient Age:    89 years      BP:           134/52 mmHg Patient Gender: M             HR:           70 bpm. Exam Location:  [facility] Procedure: 2D Echo, Cardiac Doppler and Color Doppler Indications:    CHF-Acute Diastolic [id number]  History:        Patient has prior history of Echocardiogram examinations, most                 recent 03/05/2022. Risk Factors:Hypertension, Diabetes and                 Dyslipidemia. Hx of COVID-19, Complete Heart Block.  Sonographer:    Alvino Chapel RCS Referring Phys: [id number] OLADAPO ADEFESO IMPRESSIONS  1. Left ventricular ejection fraction, by estimation, is 50 to 55%. The left ventricle has low normal function. Left ventricular endocardial border not optimally defined to evaluate regional wall motion. There is moderate left ventricular hypertrophy. Left ventricular diastolic function could not be evaluated. The E/e' is 25.4. There is the interventricular septum is flattened in systole and diastole, consistent with right ventricular pressure and volume overload.  2. Right ventricular systolic function is normal. The right ventricular size is  normal. There is severely elevated pulmonary artery systolic pressure. The estimated right ventricular systolic pressure is 61.3  mmHg.  3. Left atrial size was severely dilated.  4. A small pericardial effusion is present. The pericardial effusion is localized near the left atrium.  5. The mitral valve is abnormal. Mild to moderate mitral valve regurgitation. No evidence of mitral stenosis. Moderate to severe mitral annular calcification.  6. The tricuspid valve is abnormal. Tricuspid valve regurgitation is moderate.  7. The aortic valve is tricuspid. There is mild calcification of the aortic valve. Aortic valve regurgitation is mild to moderate. No aortic stenosis is present.  8. The inferior vena cava is dilated in size with >50% respiratory variability, suggesting right atrial pressure of 8 mmHg. Comparison(s): No significant change from prior study. FINDINGS  Left Ventricle: Left ventricular ejection fraction, by estimation, is 50 to 55%. The left ventricle has low normal function. Left ventricular endocardial border not optimally defined to evaluate regional wall motion. The left ventricular internal cavity  size was normal in size. There is moderate left ventricular hypertrophy. The interventricular septum is flattened in systole and diastole, consistent with right ventricular pressure and volume overload. Left ventricular diastolic function could not be evaluated due to paced rhythm. Left ventricular diastolic function could not be evaluated. The E/e' is 25.4. Right Ventricle: The right ventricular size is normal. No increase in right ventricular wall thickness. Right ventricular systolic function is normal. There is severely elevated pulmonary artery systolic pressure. The tricuspid regurgitant velocity is 3.65 m/s, and with an assumed right atrial pressure of 8 mmHg, the estimated right ventricular systolic pressure is 123456 mmHg. Left Atrium: Left atrial size was severely dilated. Right Atrium: Right atrial  size was normal in size. Pericardium: A small pericardial effusion is present. The pericardial effusion is localized near the left atrium. Mitral Valve: The mitral valve is abnormal. Moderate to severe mitral annular calcification. Mild to moderate mitral valve regurgitation. No evidence of mitral valve stenosis. Tricuspid Valve: The tricuspid valve is abnormal. Tricuspid valve regurgitation is moderate . No evidence of tricuspid stenosis. Aortic Valve: The aortic valve is tricuspid. There is mild calcification of the aortic valve. Aortic valve regurgitation is mild to moderate. Aortic regurgitation PHT measures 342 msec. No aortic stenosis is present. Pulmonic Valve: The pulmonic valve was normal in structure. Pulmonic valve regurgitation is mild to moderate. No evidence of pulmonic stenosis. Aorta: The aortic root is normal in size and structure. Venous: The inferior vena cava is dilated in size with greater than 50% respiratory variability, suggesting right atrial pressure of 8 mmHg. IAS/Shunts: No atrial level shunt detected by color flow Doppler. Additional Comments: A device lead is visualized.  LEFT VENTRICLE PLAX 2D LVIDd:         4.60 cm   Diastology LVIDs:         3.40 cm   LV e' medial:    4.13 cm/s LV PW:         1.00 cm   LV E/e' medial:  25.4 LV IVS:        1.40 cm   LV e' lateral:   7.29 cm/s LVOT diam:     2.00 cm   LV E/e' lateral: 14.4 LV SV:         68 LV SV Index:   38 LVOT Area:     3.14 cm  RIGHT VENTRICLE RV S prime:     10.60 cm/s TAPSE (M-mode): 2.1 cm LEFT ATRIUM              Index  RIGHT ATRIUM           Index LA diam:        5.00 cm  2.75 cm/m   RA Area:     16.20 cm LA Vol (A2C):   112.0 ml 61.57 ml/m  RA Volume:   40.10 ml  22.05 ml/m LA Vol (A4C):   69.6 ml  38.26 ml/m LA Biplane Vol: 89.3 ml  49.09 ml/m  AORTIC VALVE LVOT Vmax:   84.20 cm/s LVOT Vmean:  55.000 cm/s LVOT VTI:    0.218 m AI PHT:      342 msec  AORTA Ao Root diam: 3.40 cm MITRAL VALVE                 TRICUSPID VALVE MV Area (PHT): 5.38 cm     TR Peak grad:   53.3 mmHg MV Decel Time: 141 msec     TR Vmax:        365.00 cm/s MV E velocity: 105.00 cm/s MV A velocity: 92.10 cm/s   SHUNTS MV E/A ratio:  1.14         Systemic VTI:  0.22 m                             Systemic Diam: 2.00 cm Vishnu Priya Mallipeddi Electronically signed by Lorelee Cover Mallipeddi Signature Date/Time: 08/12/2022/8:59:00 PM    Final    US Venous Img Lower Bilateral (DVT)  Result Date: 08/12/2022 CLINICAL DATA:  9965 Edema 9965 EXAM: BILATERAL LOWER EXTREMITY VENOUS DOPPLER ULTRASOUND TECHNIQUE: Gray-scale sonography with graded compression, as well as color Doppler and duplex ultrasound were performed to evaluate the lower extremity deep venous systems from the level of the common femoral vein and including the common femoral, femoral, profunda femoral, popliteal and calf veins including the posterior tibial, peroneal and gastrocnemius veins when visible. The superficial great saphenous vein was also interrogated. Spectral Doppler was utilized to evaluate flow at rest and with distal augmentation maneuvers in the common femoral, femoral and popliteal veins. COMPARISON:  CT AP, 07/07/2021 FINDINGS: RIGHT LOWER EXTREMITY VENOUS Normal compressibility of the RIGHT common femoral, superficial femoral, and popliteal veins, as well as the visualized calf veins. Visualized portions of profunda femoral vein and great saphenous vein unremarkable. No filling defects to suggest DVT on grayscale or color Doppler imaging. Doppler waveforms show normal direction of venous flow, normal respiratory plasticity and response to augmentation. OTHER No evidence of superficial thrombophlebitis or abnormal fluid collection. Limitations: none LEFT LOWER EXTREMITY VENOUS Normal compressibility of the LEFT common femoral, superficial femoral, and popliteal veins, as well as the visualized calf veins. Visualized portions of profunda femoral vein and great  saphenous vein unremarkable. No filling defects to suggest DVT on grayscale or color Doppler imaging. Doppler waveforms show normal direction of venous flow, normal respiratory plasticity and response to augmentation. OTHER No evidence of superficial thrombophlebitis or abnormal fluid collection. Mild subcutaneous edema of the distal LEFT lower extremity. Limitations: none IMPRESSION: No evidence of femoropopliteal DVT or superficial thrombophlebitis within either lower extremity. Michaelle Birks, MD [facility] Electronically Signed   By: Michaelle Birks M.D.   On: 08/12/2022 15:14   CT Head Wo Contrast  Result Date: 08/11/2022 CLINICAL DATA:  Altered mental status EXAM: CT HEAD WITHOUT CONTRAST TECHNIQUE: Contiguous axial images were obtained from the base of the skull through the vertex without intravenous contrast. RADIATION DOSE REDUCTION: This exam  was performed according to the departmental dose-optimization program which includes automated exposure control, adjustment of the mA and/or kV according to patient size and/or use of iterative reconstruction technique. COMPARISON:  03/07/2022 FINDINGS: Brain: There is no mass, hemorrhage or extra-axial collection. There is generalized atrophy without lobar predilection. Hypodensity of the white matter is most commonly associated with chronic microvascular disease. Vascular: Atherosclerotic calcification of the vertebral and internal carotid arteries at the skull base. No abnormal hyperdensity of the major intracranial arteries or dural venous sinuses. Skull: The visualized skull base, calvarium and extracranial soft tissues are normal. Sinuses/Orbits: No fluid levels or advanced mucosal thickening of the visualized paranasal sinuses. No mastoid or middle ear effusion. The orbits are normal. IMPRESSION: 1. No acute intracranial abnormality. 2. Generalized atrophy and findings of chronic microvascular disease.  Electronically Signed   By: Ulyses Jarred M.D.   On: 08/11/2022 19:12   DG Chest Portable 1 View  Result Date: 08/11/2022 CLINICAL DATA:  Heart failure.  Hematoma and bruising to left arm. EXAM: PORTABLE CHEST 1 VIEW COMPARISON:  Chest radiographs 08/04/2022 and 03/14/2022 FINDINGS: Status post median sternotomy and CABG. Cardiac silhouette again appears mildly enlarged. Left chest wall cardiac pacer is seen with leads overlying the right atrium and right ventricle. Normal variant azygous lobe is again noted. There is again moderate elevation of the right hemidiaphragm. Mild right pleural effusion again obscures the elevated right hemidiaphragm. There is subtle increased pleural fluid tracking up the lateral right fissure compared to 08/04/2022. No definitive left pleural effusion, noting a tiny left pleural effusion on 08/04/2022. No pneumothorax. Moderate bilateral interstitial thickening is similar to prior. No acute skeletal abnormality. IMPRESSION: 1. Moderate bilateral interstitial thickening is similar to prior and may be due to pulmonary edema. 2. Mild right pleural effusion, slightly increased from 08/04/2022. Electronically Signed   By: Yvonne Kendall M.D.   On: 08/11/2022 18:39   DG Forearm Left  Result Date: 08/11/2022 CLINICAL DATA:  Fall. Two falls today out of wheelchair. Large hematoma and bruising to left arm. EXAM: LEFT ELBOW - 2 VIEW; LEFT FOREARM - 2 VIEW COMPARISON:  None Available. FINDINGS: Left elbow: No definite joint effusion. No acute fracture line is seen. Normal alignment. Mild elbow joint space narrowing. Mild vascular calcifications. Left forearm: No acute fracture is seen within the radius or ulna. Normal alignment at the wrist. Moderate vascular calcifications. IMPRESSION: 1. No acute fracture of the left elbow or forearm. 2. Mild elbow osteoarthritis. Electronically Signed   By: Yvonne Kendall M.D.   On: 08/11/2022 18:32   DG Elbow 2 Views Left  Result Date:  08/11/2022 CLINICAL DATA:  Fall. Two falls today out of wheelchair. Large hematoma and bruising to left arm. EXAM: LEFT ELBOW - 2 VIEW; LEFT FOREARM - 2 VIEW COMPARISON:  None Available. FINDINGS: Left elbow: No definite joint effusion. No acute fracture line is seen. Normal alignment. Mild elbow joint space narrowing. Mild vascular calcifications. Left forearm: No acute fracture is seen within the radius or ulna. Normal alignment at the wrist. Moderate vascular calcifications. IMPRESSION: 1. No acute fracture of the left elbow or forearm. 2. Mild elbow osteoarthritis. Electronically Signed   By: Yvonne Kendall M.D.   On: 08/11/2022 18:32   DG Chest 2 View  Result Date: 08/05/2022 CLINICAL DATA:  89 year old male with wheezing EXAM: CHEST - 2 VIEW COMPARISON:  03/14/2022 FINDINGS: Cardiomediastinal silhouette unchanged with cardiomegaly. Surgical changes of median sternotomy and CABG. Left chest wall pacing device with 2 leads  in place unchanged. New opacities the bilateral lung bases with obscuration of the hemidiaphragms, larger on the right than the left. Associated meniscus. No pneumothorax. Degenerative changes of the spine. Syndesmophytes on the lateral view. IMPRESSION: New right greater than left pleural effusion with associated atelectasis/consolidation. Changes of the thoracic spine suggest ankylosing spondylitis Electronically Signed   By: Corrie Mckusick D.O.   On: 08/05/2022 10:52   CUP PACEART REMOTE DEVICE CHECK  Result Date: 07/30/2022 Scheduled remote reviewed. Normal device function.  Next remote 91 days. LA  CUP PACEART INCLINIC DEVICE CHECK  Result Date: 07/28/2022 Pacemaker check in clinic. Normal device function. Thresholds, sensing, impedances consistent with previous measurements. Device programmed to maximize longevity.  AT/AF <.1%, there were also 2 short runs of what appears as NSVT.  Patient presents with s/s of fluid retention and SOB on exertion.  Dr. Lovena Le made aware and  adjustments made to his medication (see encounter in EPIC). Device programmed at appropriate safety margins.  Programmed off ADAPTIVE and set fixed for pacing thresholds as follows as ordered by Dr. Lovena Le: RA 2.5v@.78ms RV  2.5V @ .56ms  Histogram distribution appropriate for patient activity level. Device programmed to optimize intrinsic conduction. Estimated longevity 21 months. Patient enrolled in remote follow-up. Patient education completed.Leticia Penna, RN   Microbiology: Results for orders placed or performed during the hospital encounter of 08/11/22  SARS Coronavirus 2 by RT PCR (hospital order, performed in [facility] hospital lab) *cepheid single result test* Anterior Nasal Swab     Status: None   Collection Time: 08/14/22 10:00 AM   Specimen: Anterior Nasal Swab  Result Value Ref Range Status   SARS Coronavirus 2 by RT PCR NEGATIVE NEGATIVE Final    Comment: (NOTE) SARS-CoV-2 target nucleic acids are NOT DETECTED.  The SARS-CoV-2 RNA is generally detectable in upper and lower respiratory specimens during the acute phase of infection. The lowest concentration of SARS-CoV-2 viral copies this assay can detect is 250 copies / mL. A negative result does not preclude SARS-CoV-2 infection and should not be used as the sole basis for treatment or other patient management decisions.  A negative result may occur with improper specimen collection / handling, submission of specimen other than nasopharyngeal swab, presence of viral mutation(s) within the areas targeted by this assay, and inadequate number of viral copies (<250 copies / mL). A negative result must be combined with clinical observations, patient history, and epidemiological information.  Fact Sheet for Patients:   https://www.patel.info/  Fact Sheet for Healthcare Providers: https://hall.com/  This test is not yet approved or  cleared by the [place] FDA and has been  authorized for detection and/or diagnosis of SARS-CoV-2 by FDA under an Emergency Use Authorization (EUA).  This EUA will remain in effect (meaning this test can be used) for the duration of the COVID-19 declaration under Section 564(b)(1) of the Act, 21 U.S.C. section 360bbb-3(b)(1), unless the authorization is terminated or revoked sooner.  Performed at [facility], [address], [place], [place] [address]     Labs: CBC: Recent Labs  Lab 08/11/22 1823 08/12/22 0402 08/13/22 0401  WBC 9.2 9.2 7.3  NEUTROABS 6.5  --  4.0  HGB 13.3 11.8* 12.1*  HCT 40.5 36.2* 37.0*  MCV 89.0 89.2 89.4  PLT 187 179 [id number]   Basic Metabolic Panel: Recent Labs  Lab 08/11/22 1823 08/12/22 0402 08/13/22 0401 08/14/22 0428  NA 132* 133* 133* 132*  K 4.0 3.1* 3.4* 3.8  CL 95* 95* 93* 94*  CO2 26 27 31 31   GLUCOSE 123* 82 96 105*  BUN 25* 22 28* 27*  CREATININE 1.31* 1.12 1.31* 1.13  CALCIUM 9.9 9.7 9.4 9.5  MG 1.8 1.8 1.8  --   PHOS  --  2.7  --   --    Liver Function Tests: Recent Labs  Lab 08/11/22 1823 08/12/22 0402 08/13/22 0401  AST 27 23 20   ALT 26 22 21   ALKPHOS 84 69 72  BILITOT 1.4* 1.2 1.1  PROT 7.4 6.4* 6.0*  ALBUMIN 4.3 3.7 3.5   CBG: Recent Labs  Lab 08/13/22 1132 08/13/22 1700 08/13/22 2040 08/14/22 0729 08/14/22 1107  GLUCAP 172* 144* 144* 113* 161*    Discharge time spent: greater than 30 minutes.  Signed: Patrecia Pour, MD [facility] 08/14/2022

## 2022-08-14 NOTE — TOC Progression Note (Signed)
Transition of Care [facility]) - Progression Note    Patient Details  Name: Guy Roberson MRN: [id number] Date of Birth: February 03, 1932  [facility] CM/SW Contact  Salome Arnt, [place] Phone Number: 08/14/2022, 1:53 PM  Clinical Narrative: LCSW noted at 1315 that pt was out of system. LCSW called pt's RN who reports they sent pt to [facility]. LCSW informed RN that authorization had not been received yet and pt would have to return. LCSW notified pt's daughter-in-law of situation who was very understanding. Authorization has now gone to peer-to-peer. MD notified. Will continue to follow.       Expected Discharge Plan: [place] Barriers to Discharge: Continued Medical Work up  Expected Discharge Plan and Services In-house Referral: Clinical Social Work Discharge Planning Services: CM Consult Post Acute Care Choice: [place] Living arrangements for the past 2 months: Single Family Home Expected Discharge Date: 08/14/22                                     Social Determinants of Health (SDOH) Interventions SDOH Screenings   Food Insecurity: No Food Insecurity (08/11/2022)  Housing: Low Risk  (08/11/2022)  Transportation Needs: No Transportation Needs (08/11/2022)  Utilities: Not At Risk (08/11/2022)  Tobacco Use: High Risk (08/11/2022)    Readmission Risk Interventions    08/12/2022    1:48 PM 03/06/2022    2:29 PM  Readmission Risk Prevention Plan  Transportation Screening Complete Complete  Home Care Screening  Complete  Medication Review (RN CM)  Complete  HRI or Home Care Consult Complete   Social Work Consult for [place] Planning/Counseling Complete   Palliative Care Screening Not Applicable   Medication Review Press photographer) Complete

## 2022-08-14 NOTE — TOC Transition Note (Signed)
Transition of Care [facility]) - CM/SW Discharge Note   Patient Details  Name: Guy Roberson MRN: [id number] Date of Birth: 22-Oct-1931  Transition of Care [facility]) CM/SW Contact:  Salome Arnt, LCSW Phone Number: 08/14/2022, 2:52 PM   Clinical Narrative: Pt d/c today. Peer-to-peer completed and authorization received. LCSW updated pt's daughter-in-law, Rodena Piety. RN notified and has called report. COVID test negative. Will transfer with staff. D/C summary sent to SNF.        Final next level of care: Skilled Nursing Facility Barriers to Discharge: Barriers Resolved   Patient Goals and CMS Choice CMS Medicare.gov Compare Post Acute Care list provided to:: Patient Represenative (must comment) Choice offered to / list presented to : Adult Children, Spouse  Discharge Placement                Patient [facility] bed at: [facility] Patient to be transferred to facility by: staff Name of family member notified: Rodena Piety Patient and family notified of of transfer: 08/14/22  Discharge Plan and Services Additional resources added to the After Visit Summary for   In-house Referral: Clinical Social Work Discharge Planning Services: CM Consult Post Acute Care Choice: [place]                               Social Determinants of Health (SDOH) Interventions SDOH Screenings   Food Insecurity: No Food Insecurity (08/11/2022)  Housing: [place]  (08/11/2022)  Transportation Needs: No Transportation Needs (08/11/2022)  Utilities: Not At Risk (08/11/2022)  Tobacco Use: High Risk (08/11/2022)     Readmission Risk Interventions    08/12/2022    1:48 PM 03/06/2022    2:29 PM  Readmission Risk Prevention Plan  Transportation Screening Complete Complete  Home Care Screening  Complete  Medication Review (RN CM)  Complete  HRI or Home Care Consult Complete   Social Work Consult for [place] Planning/Counseling Complete   Palliative Care Screening Not  Applicable   Medication Review Press photographer) Complete

## 2022-08-14 NOTE — Progress Notes (Signed)
Patient alert with confusion. Patient rested through the night, no acute events overnight.

## 2022-08-14 NOTE — Care Management Important Message (Signed)
Important Message  Patient Details  Name: Guy Roberson MRN: [id number] Date of Birth: Dec 15, 1931   Medicare Important Message Given:  Yes     Tommy Medal 08/14/2022, 11:47 AM

## 2022-08-14 NOTE — Plan of Care (Signed)
  Problem: Nutrition: Goal: Adequate nutrition will be maintained Outcome: Progressing   

## 2022-08-14 NOTE — Progress Notes (Addendum)
Pt attempted report [facility] to [facility] at [id number]  [id number] [facility] side to [place] .Marland Kitchen Jessica sup ... Report given pt going to room 128

## 2022-08-17 ENCOUNTER — Non-Acute Institutional Stay (SKILLED_NURSING_FACILITY): Payer: 59 | Admitting: Adult Health

## 2022-08-17 ENCOUNTER — Encounter: Payer: Self-pay | Admitting: Adult Health

## 2022-08-17 DIAGNOSIS — E785 Hyperlipidemia, unspecified: Secondary | ICD-10-CM

## 2022-08-17 DIAGNOSIS — I442 Atrioventricular block, complete: Secondary | ICD-10-CM | POA: Diagnosis not present

## 2022-08-17 DIAGNOSIS — I110 Hypertensive heart disease with heart failure: Secondary | ICD-10-CM | POA: Diagnosis not present

## 2022-08-17 DIAGNOSIS — J9601 Acute respiratory failure with hypoxia: Secondary | ICD-10-CM

## 2022-08-17 DIAGNOSIS — K219 Gastro-esophageal reflux disease without esophagitis: Secondary | ICD-10-CM

## 2022-08-17 DIAGNOSIS — N401 Enlarged prostate with lower urinary tract symptoms: Secondary | ICD-10-CM

## 2022-08-17 DIAGNOSIS — I700 Atherosclerosis of aorta: Secondary | ICD-10-CM

## 2022-08-17 DIAGNOSIS — E1169 Type 2 diabetes mellitus with other specified complication: Secondary | ICD-10-CM

## 2022-08-17 DIAGNOSIS — N3281 Overactive bladder: Secondary | ICD-10-CM

## 2022-08-17 DIAGNOSIS — I5033 Acute on chronic diastolic (congestive) heart failure: Secondary | ICD-10-CM

## 2022-08-17 DIAGNOSIS — R338 Other retention of urine: Secondary | ICD-10-CM

## 2022-08-17 DIAGNOSIS — E441 Mild protein-calorie malnutrition: Secondary | ICD-10-CM

## 2022-08-17 DIAGNOSIS — K5909 Other constipation: Secondary | ICD-10-CM

## 2022-08-17 DIAGNOSIS — E1159 Type 2 diabetes mellitus with other circulatory complications: Secondary | ICD-10-CM

## 2022-08-17 DIAGNOSIS — I708 Atherosclerosis of other arteries: Secondary | ICD-10-CM
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
## 2022-08-18 ENCOUNTER — Non-Acute Institutional Stay (SKILLED_NURSING_FACILITY): Payer: Medicare PPO | Admitting: Internal Medicine

## 2022-08-18 ENCOUNTER — Other Ambulatory Visit (HOSPITAL_COMMUNITY)
Admission: RE | Admit: 2022-08-18 | Discharge: 2022-08-18 | Disposition: A | Discharge status: Home / Self Care | Payer: 59 | Source: Skilled Nursing Facility | Attending: Adult Health | Admitting: Adult Health

## 2022-08-18 ENCOUNTER — Encounter: Payer: Self-pay | Admitting: Internal Medicine

## 2022-08-18 DIAGNOSIS — I5033 Acute on chronic diastolic (congestive) heart failure: Secondary | ICD-10-CM | POA: Insufficient documentation

## 2022-08-18 DIAGNOSIS — E1159 Type 2 diabetes mellitus with other circulatory complications: Secondary | ICD-10-CM

## 2022-08-18 DIAGNOSIS — K5909 Other constipation: Secondary | ICD-10-CM | POA: Insufficient documentation

## 2022-08-18 DIAGNOSIS — N179 Acute kidney failure, unspecified: Secondary | ICD-10-CM | POA: Diagnosis not present

## 2022-08-18 DIAGNOSIS — E785 Hyperlipidemia, unspecified: Secondary | ICD-10-CM | POA: Insufficient documentation

## 2022-08-18 DIAGNOSIS — R29818 Other symptoms and signs involving the nervous system: Secondary | ICD-10-CM | POA: Diagnosis not present

## 2022-08-18 DIAGNOSIS — E441 Mild protein-calorie malnutrition: Secondary | ICD-10-CM | POA: Insufficient documentation

## 2022-08-18 DIAGNOSIS — I700 Atherosclerosis of aorta: Secondary | ICD-10-CM | POA: Insufficient documentation

## 2022-08-18 DIAGNOSIS — I110 Hypertensive heart disease with heart failure: Secondary | ICD-10-CM | POA: Insufficient documentation

## 2022-08-18 DIAGNOSIS — N3281 Overactive bladder: Secondary | ICD-10-CM | POA: Insufficient documentation

## 2022-08-18 DIAGNOSIS — R4189 Other symptoms and signs involving cognitive functions and awareness: Secondary | ICD-10-CM

## 2022-08-18 LAB — BASIC METABOLIC PANEL
Anion gap: 8 (ref 5–15)
BUN: 31 mg/dL — ABNORMAL HIGH (ref 8–23)
CO2: 28 mmol/L (ref 22–32)
Calcium: 10.1 mg/dL (ref 8.9–10.3)
Chloride: 96 mmol/L — ABNORMAL LOW (ref 98–111)
Creatinine, Ser: 1.09 mg/dL (ref 0.61–1.24)
GFR, Estimated: >60 mL/min (ref >60)
Glucose, Bld: 126 mg/dL — ABNORMAL HIGH (ref 70–99)
Potassium: 4.3 mmol/L (ref 3.5–5.1)
Sodium: 132 mmol/L — ABNORMAL LOW (ref 135–145)

## 2022-08-18 LAB — CBC
HCT: 35.3 % — ABNORMAL LOW (ref 39.0–52.0)
Hemoglobin: 11.6 g/dL — ABNORMAL LOW (ref 13.0–17.0)
MCH: 29.3 pg (ref 26.0–34.0)
MCHC: 32.9 g/dL (ref 30.0–36.0)
MCV: 89.1 fL (ref 80.0–100.0)
Platelets: 172 10*3/uL (ref 150–400)
RBC: 3.96 MIL/uL — ABNORMAL LOW (ref 4.22–5.81)
RDW: 14.1 % (ref 11.5–15.5)
WBC: 6.5 10*3/uL (ref 4.0–10.5)
nRBC: 0 % (ref 0.0–0.2)

## 2022-08-18 NOTE — Patient Instructions (Signed)
See assessment and plan under each diagnosis in the problem list and acutely for this visit 

## 2022-08-18 NOTE — Assessment & Plan Note (Signed)
Monitor weights daily.  Nutrition consult at SNF for sodium restriction.

## 2022-08-18 NOTE — Assessment & Plan Note (Signed)
Imaging suggest that the etiology of his neurocognitive deficits is vascular dementia. [facility] MMSE pending.

## 2022-08-18 NOTE — Progress Notes (Unsigned)
NURSING HOME LOCATION:  [facility] ROOM NUMBER: 125 P  CODE STATUS: Full code  PCP: Leslie Andrea, MD  This is a comprehensive admission note to this SNFperformed on this date less than 30 days from date of admission. Included are preadmission medical/surgical history; reconciled medication list; family history; social history and comprehensive review of systems.  Corrections and additions to the records were documented. Comprehensive physical exam was also performed. Additionally a clinical summary was entered for each active diagnosis pertinent to this admission in the Problem List to enhance continuity of care.  HPI: He was hospitalized 3/12 - 08/14/2022 with acute on chronic diastolic congestive heart failure, presenting with hypoxia and dyspnea.  LVEF was 55 to 60% with moderate LVH and septal flattening consistent with RV overload.  PASP estimated at 61 mg of mercury.  Dilated IVF was unchanged.  Aggressive diuresis was initiated and subsequently de-escalated with symptomatic improvement.  Urine output was up to almost 3 L in 24 hours.  The furosemide was changed to maintenance 40 mg p.o. daily.  Apparently he had been using his wife's oxygen nocturnally for some time PTA.  Ambulatory pulse oximetry revealed no drop in the O2 sats on the day of discharge.  Serial chest x-rays revealed improved aeration but persistent pleural effusion.  Thoracentesis was declined as he felt much improved. He did exhibit acute hypoxic encephalopathy; CT of the head revealed no acute process; but demonstrated generalized atrophy and chronic microvascular disease.  AMS did resolve with improvement in his acute diastolic congestive heart failure and hypoxia.Marland Kitchen He did report that he had had a mechanical fall PTA with pain in the left elbow.  X-ray revealed no fracture.  Hematoma of the left forearm was present which appeared to be stable. Despite clinical improvement he continued to have  bilateral lower extremity edema, left greater than right.  Venous Doppler revealed no DVT. While hospitalized peak creatinine was 1.31; final value was 1.13.  Nadir GFR was 52, final value was greater than 60.  It is to be noted that on 3/5 PTA creatinine was 1.82 and GFR was 35. H/H varied from 11.6/35.3 up to 13.3/40.5; no acute blood loss present. Glucoses on record ranged from 82 up to a high of 123; last A1c on record was 6.5% on 03/05/2022.TSH was 1.728.  PT/OT recommended SNF placement for rehab.  Past medical and surgical history: Includes BPH, OAB, dyslipidemia, mood disorder, GERD, essential hypertension, CAD, history of complete heart block, diabetes with vascular complications, and history of syncope. Surgeries and procedures include bladder surgery, colonoscopy with polypectomy, CABG, esophageal dilation, pacemaker placement.  Social history: Smoker; nondrinker.  According to his son the patient's wife has severe dementia.  They live together independently.  Family history: Noncontributory due to advanced age.   Review of systems: Clinical neurocognitive deficits made validity of responses questionable. Date given as August 20, 1960 or 1972.  He did know the President is Biden.  He was unable to tell me why he had been in the hospital.  He stated that he had entered the hospital on March 7 or 8, 1964.  He did state that he was "falling all the time" prior to admission.  He stated that he was having "dreams", thinking that he was somewhere else other than his home. He has insomnia , sleeping for 2-3 hour intervals before awakening.  He describes vertigo type symptoms on 2-3 occasions for which he had to "hold onto the bed."  Constipation is  a chronic problem.  He states he does follow a low salt and low sugar diet.  Constitutional: No fever, significant weight change, fatigue  Eyes: No redness, discharge, pain, vision change ENT/mouth: No nasal congestion, purulent discharge, earache,  change in hearing, sore throat  Cardiovascular: No chest pain, palpitations, paroxysmal nocturnal dyspnea, claudication  Respiratory: No cough, sputum production, hemoptysis, DOE, significant snoring, apnea  Gastrointestinal: No heartburn, dysphagia, abdominal pain, nausea /vomiting, rectal bleeding, melena Genitourinary: No dysuria, hematuria, pyuria, incontinence, nocturia Musculoskeletal: No joint stiffness, joint swelling, pain Dermatologic: No rash, pruritus, change in appearance of skin Neurologic: No  headache, syncope, seizures, numbness, tingling Psychiatric: No significant anxiety, depression, anorexia Endocrine: No change in hair/skin/nails, excessive thirst, excessive hunger, excessive urination  Hematologic/lymphatic: No significant bruising, lymphadenopathy, abnormal bleeding Allergy/immunology: No itchy/watery eyes, significant sneezing, urticaria, angioedema  Physical exam:  Pertinent or positive findings: He appears his stated age. Pattern alopecia is present. Lower lids are puffy. He has an upper partial lower plate.  Pacemaker is present subcutaneously on the left.  Heart sounds are distant.  Chest is clear.  Abdomen slightly protuberant.  He has trace edema at the sock line.  Pedal pulses are absent.  Atrophy of the limbs is present as well as interosseous wasting of the right hand.  The left hand is markedly edematous.  He has a large hematoma over the left lateral forearm.  He has extensive ecchymoses and scattered hypopigmented scarring over the forearms with a large scar above the left elbow.  He exhibits a tremor of the left hand.  He has keratotic lesions over the left inferior shin and scarring of the shins bilaterally.  General appearance: no acute distress, increased work of breathing is present.   Lymphatic: No lymphadenopathy about the head, neck, axilla. Eyes: No conjunctival inflammation or lid edema is present. There is no scleral icterus. Ears:  External ear exam  shows no significant lesions or deformities.   Nose:  External nasal examination shows no deformity or inflammation. Nasal mucosa are pink and moist without lesions, exudates Neck:  No thyromegaly, masses, tenderness noted.    Heart:  Normal rate and regular rhythm. S1 and S2 normal without gallop, murmur, click, rub.  Lungs:  without wheezes, rhonchi, rales, rubs. Abdomen: Bowel sounds are normal.  Abdomen is soft and nontender with no organomegaly, hernias, masses. GU: Deferred  Extremities:  No cyanosis, clubbing. Neurologic exam: Balance, Rhomberg, finger to nose testing could not be completed due to clinical state Skin: Warm & dry w/o tenting.  See clinical summary under each active problem in the Problem List with associated updated therapeutic plan

## 2022-08-18 NOTE — Assessment & Plan Note (Signed)
Glucoses while hospitalized ranged from 82 up to a high of 123.  Most recent A1c on 03/05/2022 was 6.5%.  No change indicated.

## 2022-08-18 NOTE — Assessment & Plan Note (Signed)
Pre hospitalization on 08/04/2022 creatinine 1.82 and GFR 35 indicating low stage IIIb AKI, attributed to over diuresis as OP.  While hospitalized with chronic diastolic congestive heart failure 3/12 - 08/14/2022 peak creatinine was 1.31 and nadir GFR 52; final values were 1.13 and greater than 60 indicating CKD stage II.  Med list reviewed; no change in meds or dosage indicated.

## 2022-08-20 ENCOUNTER — Ambulatory Visit (HOSPITAL_COMMUNITY)
Admission: RE | Admit: 2022-08-20 | Discharge: 2022-08-20 | Disposition: A | Discharge status: Home / Self Care | Payer: Medicare PPO | Source: Ambulatory Visit | Attending: Adult Health | Admitting: Adult Health

## 2022-08-20 ENCOUNTER — Other Ambulatory Visit (HOSPITAL_COMMUNITY): Payer: Self-pay | Admitting: Adult Health

## 2022-08-20 DIAGNOSIS — I5033 Acute on chronic diastolic (congestive) heart failure: Secondary | ICD-10-CM | POA: Insufficient documentation

## 2022-08-21 ENCOUNTER — Encounter: Payer: Self-pay | Admitting: Adult Health

## 2022-08-21 ENCOUNTER — Other Ambulatory Visit: Payer: Self-pay | Admitting: Adult Health

## 2022-08-21 ENCOUNTER — Non-Acute Institutional Stay (SKILLED_NURSING_FACILITY): Payer: Medicare PPO | Admitting: Adult Health

## 2022-08-21 DIAGNOSIS — E1159 Type 2 diabetes mellitus with other circulatory complications: Secondary | ICD-10-CM | POA: Diagnosis not present

## 2022-08-21 DIAGNOSIS — I700 Atherosclerosis of aorta: Secondary | ICD-10-CM

## 2022-08-21 DIAGNOSIS — I110 Hypertensive heart disease with heart failure: Secondary | ICD-10-CM | POA: Diagnosis not present

## 2022-08-21 DIAGNOSIS — I5033 Acute on chronic diastolic (congestive) heart failure: Secondary | ICD-10-CM | POA: Diagnosis not present

## 2022-08-21 DIAGNOSIS — I708 Atherosclerosis of other arteries: Secondary | ICD-10-CM

## 2022-08-21 MED ORDER — ALBUTEROL SULFATE HFA 108 (90 BASE) MCG/ACT IN AERS: 2.0000 | INHALATION_SPRAY | Freq: Four times a day (QID) | RESPIRATORY_TRACT | 0 refills | Status: DC | PRN

## 2022-08-21 MED ORDER — POTASSIUM CHLORIDE CRYS ER 20 MEQ PO TBCR: 20.0000 meq | EXTENDED_RELEASE_TABLET | Freq: Every morning | ORAL | 0 refills | Status: DC

## 2022-08-21 MED ORDER — FUROSEMIDE 40 MG PO TABS: 40.0000 mg | ORAL_TABLET | Freq: Every day | ORAL | 0 refills | Status: DC

## 2022-08-21 MED ORDER — LISINOPRIL 10 MG PO TABS: 10.0000 mg | ORAL_TABLET | Freq: Two times a day (BID) | ORAL | 0 refills | Status: DC

## 2022-08-21 MED ORDER — METOPROLOL TARTRATE 50 MG PO TABS: 50.0000 mg | ORAL_TABLET | Freq: Two times a day (BID) | ORAL | 0 refills | Status: DC

## 2022-08-21 MED ORDER — PANTOPRAZOLE SODIUM 20 MG PO TBEC: DELAYED_RELEASE_TABLET | ORAL | 0 refills | Status: DC

## 2022-08-21 MED ORDER — CONSTULOSE 10 GM/15ML PO SOLN: 20.0000 g | Freq: Two times a day (BID) | ORAL | 0 refills | Status: DC | PRN

## 2022-08-21 MED ORDER — TAMSULOSIN HCL 0.4 MG PO CAPS: 0.4000 mg | ORAL_CAPSULE | Freq: Every day | ORAL | 0 refills | Status: DC

## 2022-08-21 MED ORDER — SIMVASTATIN 20 MG PO TABS: 20.0000 mg | ORAL_TABLET | Freq: Every day | ORAL | 0 refills | Status: DC
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
## 2022-08-28 NOTE — Progress Notes (Signed)
Remote pacemaker transmission.   

## 2022-11-08 ENCOUNTER — Emergency Department (HOSPITAL_COMMUNITY)
Admission: EM | Admit: 2022-11-08 | Discharge: 2022-11-08 | Disposition: A | Discharge status: Home / Self Care | Payer: Medicare PPO | Attending: Emergency Medicine | Admitting: Emergency Medicine

## 2022-11-08 ENCOUNTER — Encounter (HOSPITAL_COMMUNITY): Payer: Self-pay | Admitting: Emergency Medicine

## 2022-11-08 ENCOUNTER — Other Ambulatory Visit: Payer: Self-pay

## 2022-11-08 DIAGNOSIS — I10 Essential (primary) hypertension: Secondary | ICD-10-CM | POA: Insufficient documentation

## 2022-11-08 DIAGNOSIS — Z7982 Long term (current) use of aspirin: Secondary | ICD-10-CM | POA: Diagnosis not present

## 2022-11-08 DIAGNOSIS — Z79899 Other long term (current) drug therapy: Secondary | ICD-10-CM | POA: Diagnosis not present

## 2022-11-08 DIAGNOSIS — R300 Dysuria: Secondary | ICD-10-CM | POA: Insufficient documentation

## 2022-11-08 DIAGNOSIS — N3090 Cystitis, unspecified without hematuria: Secondary | ICD-10-CM

## 2022-11-08 LAB — URINALYSIS, ROUTINE W REFLEX MICROSCOPIC
Bilirubin Urine: NEGATIVE
Glucose, UA: NEGATIVE mg/dL
Ketones, ur: NEGATIVE mg/dL
Nitrite: POSITIVE — AB
Protein, ur: 100 mg/dL — AB
Specific Gravity, Urine: 1.012 (ref 1.005–1.030)
WBC, UA: >50 WBC/hpf (ref 0–5)
pH: 7 (ref 5.0–8.0)

## 2022-11-08 MED ORDER — CEPHALEXIN 500 MG PO CAPS: 500.0000 mg | ORAL_CAPSULE | Freq: Three times a day (TID) | ORAL | 0 refills | Status: AC

## 2022-11-08 MED ORDER — CEPHALEXIN 500 MG PO CAPS
500.0000 mg | ORAL_CAPSULE | Freq: Once | ORAL | Status: AC
  Administered 2022-11-08: 500 mg via ORAL
  Filled 2022-11-08: qty 1

## 2022-11-08 NOTE — ED Triage Notes (Signed)
Pt c/o of pain and burning with urination x5 days.

## 2022-11-08 NOTE — Discharge Instructions (Signed)
You do have what appears to be a bladder infection, this will need to be treated with an antibiotic for the next week, the medication is called cephalexin, take it 3 times a day for 7 days.  You should not take the sulfa medications because of the blood pressure medicine that you are taking and it could interact.  You may not feel better for a day or 2 but should start getting better at that point, if in the meantime if you develop severe pain numbness nausea weakness fevers or any other symptoms return to the ER immediately otherwise you can see your family doctor tomorrow at your appointment

## 2022-11-08 NOTE — ED Provider Notes (Signed)
[place] EMERGENCY DEPARTMENT AT [facility] Provider Note   CSN: [id number] Arrival date & time: 11/08/22  1048     History  Chief Complaint  Patient presents with   Dysuria    Guy Roberson is a 89 y.o. male.   Dysuria Presenting symptoms: dysuria    This patient is an 89 year old male, he has a history of recurrent urinary tract infections, he is on lisinopril for hypertension and furosemide.  He has a significant amount of urinary incontinence for which she wears a adult undergarment.  He reports that in the last few days he has had some burning with urination, he states that he felt like he had some white buildup or discharge around his penis and when he had burning this morning he insisted on coming to be seen immediately.  He does not have fevers chills nausea vomiting belly pain or back pain.  He does have an appointment with his family doctor, Dr. Margo Aye tomorrow.  He most recently has been treated with a sulfa antibiotic although not recently it was the most recent antibiotic    Home Medications Prior to Admission medications   Medication Sig Start Date End Date Taking? Authorizing Provider  acetaminophen (TYLENOL) 650 MG CR tablet Take 650 mg by mouth every 8 (eight) hours as needed for pain or fever. 02/02/20   [provider]  albuterol (VENTOLIN HFA) 108 (90 Base) MCG/ACT inhaler Inhale 2 puffs into the lungs every 6 (six) hours as needed for wheezing or shortness of breath. 08/21/22   Sharee Holster, NP  aspirin EC 81 MG tablet Take 1 tablet (81 mg total) by mouth every morning. 07/06/19   Rehman, Joline Maxcy, MD  CONSTULOSE 10 GM/15ML solution Take 30 mLs (20 g total) by mouth 2 (two) times daily as needed for moderate constipation. 08/21/22   Sharee Holster, NP  feeding supplement (ENSURE ENLIVE / ENSURE PLUS) LIQD Take 237 mLs by mouth 2 (two) times daily between meals. 03/16/22   Shahmehdi, Gemma Payor, MD  furosemide (LASIX) 40 MG tablet Take 1 tablet (40  mg total) by mouth daily. 08/21/22   Sharee Holster, NP  lisinopril (ZESTRIL) 10 MG tablet Take 1 tablet (10 mg total) by mouth 2 (two) times daily. 08/21/22 09/20/22  Sharee Holster, NP  metoprolol tartrate (LOPRESSOR) 50 MG tablet Take 1 tablet (50 mg total) by mouth 2 (two) times daily. 08/21/22   Sharee Holster, NP  pantoprazole (PROTONIX) 20 MG tablet TAKE ONE TABLET (20MG  TOTAL) BY MOUTH TWO TIMES DAILY BEFORE A MEAL 08/21/22   Sharee Holster, NP  potassium chloride SA (KLOR-CON M) 20 MEQ tablet Take 1 tablet (20 mEq total) by mouth every morning. 08/21/22   Sharee Holster, NP  simvastatin (ZOCOR) 20 MG tablet Take 1 tablet (20 mg total) by mouth daily. 08/21/22   Sharee Holster, NP  SURE COMFORT PEN NEEDLES 31G X 5 MM MISC USE AS DIRECTED UP TO 3ITIMES DAILY. 04/29/22   [provider]  tamsulosin (FLOMAX) 0.4 MG CAPS capsule Take 1 capsule (0.4 mg total) by mouth daily after supper. 08/21/22   Sharee Holster, NP      Allergies    Penicillins    Review of Systems   Review of Systems  Genitourinary:  Positive for dysuria.  All other systems reviewed and are negative.   Physical Exam Updated Vital Signs BP (!) 146/73   Pulse 93   Temp 97.7 F (  36.5 C) (Oral)   Resp 16   Ht 1.702 m (5\' 7" )   Wt 68.5 kg   SpO2 97%   BMI 23.65 kg/m  Physical Exam Vitals and nursing note reviewed.  Constitutional:      General: He is not in acute distress.    Appearance: He is well-developed.  HENT:     Head: Normocephalic and atraumatic.     Mouth/Throat:     Pharynx: No oropharyngeal exudate.  Eyes:     General: No scleral icterus.       Right eye: No discharge.        Left eye: No discharge.     Conjunctiva/sclera: Conjunctivae normal.     Pupils: Pupils are equal, round, and reactive to light.  Neck:     Thyroid: No thyromegaly.     Vascular: No JVD.  Cardiovascular:     Rate and Rhythm: Normal rate and regular rhythm.     Heart sounds: Normal heart sounds. No  murmur heard.    No friction rub. No gallop.  Pulmonary:     Effort: Pulmonary effort is normal. No respiratory distress.     Breath sounds: Normal breath sounds. No wheezing or rales.  Abdominal:     General: Bowel sounds are normal. There is no distension.     Palpations: Abdomen is soft. There is no mass.     Tenderness: There is no abdominal tenderness.  Genitourinary:    Comments: Normal-appearing uncircumcised penis, foreskin is easily to retract, there is some urinary incontinence, it appears clear, there is no discharge or redness or tenderness, normal scrotum and testicles Musculoskeletal:        General: No tenderness. Normal range of motion.     Cervical back: Normal range of motion and neck supple.     Right lower leg: No edema.     Left lower leg: No edema.  Lymphadenopathy:     Cervical: No cervical adenopathy.  Skin:    General: Skin is warm and dry.     Findings: No erythema or rash.  Neurological:     Mental Status: He is alert.     Coordination: Coordination normal.  Psychiatric:        Behavior: Behavior normal.     ED Results / Procedures / Treatments   Labs (all labs ordered are listed, but only abnormal results are displayed) Labs Reviewed  URINE CULTURE  URINALYSIS, ROUTINE W REFLEX MICROSCOPIC    EKG None  Radiology No results found.  Procedures Procedures    Medications Ordered in ED Medications  cephALEXin (KEFLEX) capsule 500 mg (has no administration in time range)    ED Course/ Medical Decision Making/ A&P                             Medical Decision Making Amount and/or Complexity of Data Reviewed Labs: ordered.  Risk Prescription drug management.    This patient presents to the ED for concern of dysuria differential diagnosis includes urinary tract infection, prostatitis, urethritis    Additional history obtained:  Additional history obtained from medical record External records from outside source obtained and  reviewed including prior UTIs   Lab Tests:  I Ordered, and personally interpreted labs.  The pertinent results include: Urinalysis with culture, urinalysis with nitrites and greater than 50 white blood cells consistent with UTI, culture sent    Medicines ordered and prescription drug management:  I ordered medication  including cephalexin for possible UTI, will avoid sulfa medications given that he is on an ACE inhibitor which can cause renal insufficiency and hyperkalemia Reevaluation of the patient after these medicines showed that the patient stable I have reviewed the patients home medicines and have made adjustments as needed   Problem List / ED Course:  UTI likely   Social Determinants of Health:   I have discussed with the patient at the bedside the results, and the meaning of these results.  They have expressed her understanding to the need for follow-up with primary care physician          Final Clinical Impression(s) / ED Diagnoses Final diagnoses:  None    Rx / DC Orders ED Discharge Orders     None         Eber Hong, MD 11/08/22 1144

## 2022-11-09 LAB — URINE CULTURE

## 2022-11-10 LAB — URINE CULTURE: Culture: >=100000 — AB

## 2022-11-11 ENCOUNTER — Telehealth (HOSPITAL_BASED_OUTPATIENT_CLINIC_OR_DEPARTMENT_OTHER): Payer: Self-pay

## 2022-11-11 NOTE — Telephone Encounter (Signed)
Post ED Visit - Positive Culture Follow-up: Unsuccessful Patient Follow-up  Culture assessed and recommendations reviewed by:  [x]  [name], Pharm.D. []  Celedonio Miyamoto, Pharm.D., BCPS AQ-ID []  Garvin Fila, Pharm.D., BCPS []  Georgina Pillion, Pharm.D., BCPS []  [place], [address].D., BCPS, AAHIVP []  Estella Husk, Pharm.D., BCPS, AAHIVP []  Sherlynn Carbon, PharmD []  Pollyann Samples, PharmD, BCPS  Positive urine culture  []  Patient discharged without antimicrobial prescription and treatment is now indicated [x]  Organism is resistant to prescribed ED discharge antimicrobial []  Patient with positive blood cultures   Plan: stop Cephalexin and start Bactrim DS 800-160 mg po BID x 10 days. per ED provider Lurena Nida, PA    Unable to contact patient after 3 attempts, letter will be sent to address on file  Sandria Senter 11/11/2022, 8:51 AM

## 2022-11-11 NOTE — Progress Notes (Signed)
ED Antimicrobial Stewardship Positive Culture Follow Up   Guy Roberson is an 89 y.o. male who presented to [facility] on 11/08/2022 with a chief complaint of dysuria.  Chief Complaint  Patient presents with   Dysuria    Recent Results (from the past 720 hour(s))  Urine Culture (for pregnant, neutropenic or urologic patients or patients with an indwelling urinary catheter)     Status: Abnormal   Collection Time: 11/08/22 11:04 AM   Specimen: Urine, Clean Catch  Result Value Ref Range Status   Specimen Description   Final    URINE, CLEAN CATCH Performed at [facility], [address] [place], [place] [address]    Special Requests   Final    NONE Performed at [facility], [address], [place], [place] [address]    Culture >=100,000 COLONIES/mL [facility] MORGANII (A)  Final   Report Status 11/10/2022 FINAL  Final   Organism ID, Bacteria MORGANELLA MORGANII (A)  Final      Susceptibility   Morganella morganii - MIC*    AMPICILLIN >=32 RESISTANT Resistant     CIPROFLOXACIN <=0.25 SENSITIVE Sensitive     GENTAMICIN <=1 SENSITIVE Sensitive     IMIPENEM 4 SENSITIVE Sensitive     NITROFURANTOIN 128 RESISTANT Resistant     TRIMETH/SULFA <=20 SENSITIVE Sensitive     AMPICILLIN/SULBACTAM 16 INTERMEDIATE Intermediate     PIP/TAZO <=4 SENSITIVE Sensitive     * >=100,000 COLONIES/mL MORGANELLA MORGANII    [x]  Treated with cephalexin, organism resistant to prescribed antimicrobial  New antibiotic prescription: Bactrim DS 800-160 BID x10 days   ED Provider: Lurena Nida, PA   [name] 11/11/2022, 7:39 AM Clinical Pharmacist Monday - Friday phone -  [phone] Saturday - Sunday phone - [phone]

## 2022-12-25 ENCOUNTER — Ambulatory Visit (INDEPENDENT_AMBULATORY_CARE_PROVIDER_SITE_OTHER): Payer: 59

## 2022-12-25 ENCOUNTER — Telehealth: Payer: Self-pay | Admitting: Internal Medicine

## 2022-12-25 DIAGNOSIS — I442 Atrioventricular block, complete: Secondary | ICD-10-CM

## 2022-12-25 LAB — CUP PACEART REMOTE DEVICE CHECK
Battery Impedance: 3030 Ohm
Battery Remaining Longevity: 18 mo
Battery Voltage: 2.71 V
Brady Statistic AP VP Percent: 97 %
Brady Statistic AP VS Percent: 0 %
Brady Statistic AS VP Percent: 3 %
Brady Statistic AS VS Percent: 0 %
Date Time Interrogation Session: 20240726084607
Implantable Lead Connection Status: 753985
Implantable Lead Connection Status: 753985
Implantable Lead Implant Date: 20130730
Implantable Lead Implant Date: 20130730
Implantable Lead Location: 753859
Implantable Lead Location: 753860
Implantable Lead Model: 5076
Implantable Lead Model: 5092
Implantable Pulse Generator Implant Date: 20130730
Lead Channel Impedance Value: 476 Ohm
Lead Channel Impedance Value: 485 Ohm
Lead Channel Setting Pacing Amplitude: 2.5 V
Lead Channel Setting Pacing Amplitude: 2.5 V
Lead Channel Setting Pacing Pulse Width: 0.4 ms
Lead Channel Setting Sensing Sensitivity: 4 mV
Zone Setting Status: 755011
Zone Setting Status: 755011

## 2022-12-25 NOTE — Telephone Encounter (Signed)
Returned call to Guy Roberson who states that the pt has gained 12 lbs since June 12. [name] does not have the exact weights on her but states that Pt is gaining about a pound a day. She reports that the pt is SOB when walking from one room to another. At PCP visit on Wed pt weighed 162 lbs. Please advise.

## 2022-12-25 NOTE — Telephone Encounter (Signed)
Pt c/o medication issue:  1. Name of Medication:   furosemide (LASIX) 40 MG tablet    2. How are you currently taking this medication (dosage and times per day)?  Take 1 tablet (40 mg total) by mouth daily.       3. Are you having a reaction (difficulty breathing--STAT)? No  4. What is your medication issue? Synetta Fail (pt daughter in law) is requesting a callback regarding pt being seen at pcp on Wednesday and was told to check with MD to see he'd like to increase this medication due to pt gaining 12 lbs since June 12th. Please advise

## 2022-12-30 NOTE — Telephone Encounter (Signed)
If still sob, he will need to be seen by me or Brittany/Michelle if no primary cardiologist.

## 2022-12-30 NOTE — Telephone Encounter (Signed)
Appt made for 01/18/23 at 3:30 pm. Ms. Guy Roberson that pt has not complained of SOB but would like to be seen d/t having increased weight gain. Ms. Delrosso with monitor daily weights at home until office visit.

## 2023-01-01 NOTE — Progress Notes (Signed)
Remote pacemaker transmission.   

## 2023-01-18 ENCOUNTER — Ambulatory Visit: Payer: 59 | Attending: Cardiology | Admitting: Cardiology

## 2023-01-18 ENCOUNTER — Encounter: Payer: Self-pay | Admitting: Cardiology

## 2023-01-18 VITALS — BP 120/62 | HR 64 | Ht 67.0 in | Wt 160.0 lb

## 2023-01-18 DIAGNOSIS — I10 Essential (primary) hypertension: Secondary | ICD-10-CM

## 2023-01-18 DIAGNOSIS — I3139 Other pericardial effusion (noninflammatory): Secondary | ICD-10-CM

## 2023-01-18 DIAGNOSIS — E1169 Type 2 diabetes mellitus with other specified complication: Secondary | ICD-10-CM | POA: Diagnosis not present

## 2023-01-18 DIAGNOSIS — I2510 Atherosclerotic heart disease of native coronary artery without angina pectoris: Secondary | ICD-10-CM

## 2023-01-18 DIAGNOSIS — I5032 Chronic diastolic (congestive) heart failure: Secondary | ICD-10-CM

## 2023-01-18 DIAGNOSIS — I442 Atrioventricular block, complete: Secondary | ICD-10-CM

## 2023-01-18 DIAGNOSIS — Z950 Presence of cardiac pacemaker: Secondary | ICD-10-CM

## 2023-01-18 DIAGNOSIS — E1159 Type 2 diabetes mellitus with other circulatory complications: Secondary | ICD-10-CM

## 2023-01-18 DIAGNOSIS — E785 Hyperlipidemia, unspecified: Secondary | ICD-10-CM

## 2023-01-18 DIAGNOSIS — R600 Localized edema: Secondary | ICD-10-CM

## 2023-01-18 NOTE — Patient Instructions (Signed)
Medication Instructions:   Your physician recommends that you continue on your current medications as directed. Please refer to the Current Medication list given to you today.  *If you need a refill on your cardiac medications before your next appointment, please call your pharmacy*   Lab Work: NONE   If you have labs (blood work) drawn today and your tests are completely normal, you will receive your results only by: MyChart Message (if you have MyChart) OR A paper copy in the mail If you have any lab test that is abnormal or we need to change your treatment, we will call you to review the results.   Testing/Procedures: Your physician has requested that you have an echocardiogram. Echocardiography is a painless test that uses sound waves to create images of your heart. It provides your doctor with information about the size and shape of your heart and how well your heart's chambers and valves are working. This procedure takes approximately one hour. There are no restrictions for this procedure. Please do NOT wear cologne, perfume, aftershave, or lotions (deodorant is allowed). Please arrive 15 minutes prior to your appointment time.  chest X-ray    Follow-Up: At [facility], you and your health needs are our priority.  As part of our continuing mission to provide you with exceptional heart care, we have created designated Provider Care Teams.  These Care Teams include your primary Cardiologist (physician) and Advanced Practice Providers (APPs -  Physician Assistants and Nurse Practitioners) who all work together to provide you with the care you need, when you need it.  We recommend signing up for the patient portal called "MyChart".  Sign up information is provided on this After Visit Summary.  MyChart is used to connect with patients for Virtual Visits (Telemedicine).  Patients are able to view lab/test results, encounter notes, upcoming appointments, etc.  Non-urgent messages  can be sent to your provider as well.   To learn more about what you can do with MyChart, go to [email or web address].    Your next appointment:   3 -4 week(s)  Provider:   You will see one of the following Advanced Practice Providers on your designated Care Team:   Randall An, PA-C  Jacolyn Reedy, PA-C      Other Instructions Thank you for choosing [facility]!

## 2023-01-18 NOTE — Progress Notes (Signed)
Cardiology Office Note:  .   Date:  01/18/2023  ID:  ROMALDO KULHANEK, DOB 07/27/1931, MRN [id number] PCP: Benita Stabile, MD  [facility] Providers Cardiologist:  None    History of Present Illness: .   Guy Roberson is a 89 y.o. 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  He was last seen in clinic 07/28/2022 by Dr Ladona Ridgel.  At that time he had complained of pain to his lower extremities as well as weakness.  He also has fairly advanced dementia.  According to his wife he was having worsening shortness of breath and feeling weak at the time.  With the symptoms that he had had he was advised to increase his furosemide had to stop his amlodipine.  His device was functioning well with no changes made.  He was admitted to [facility] on 08/11/2022 status post fall, hallucinations, and shortness of breath.He was treated with IV diuresis with significant improvement.  Required SNF at the time of discharge.  It was recommended that he continue to have follow-up with repeat chest x-ray in 1 week to monitor the right pleural effusion.  At that time they were trying to avoid thoracentesis.  He had acute hypoxic encephalopathy with CT of the head that was negative for any acute stroke.  PT and OT worked with him throughout his hospitalization.  He was considered stable for discharge and discharged to SNF on 08/14/2022.  He was evaluated in the [facility] emergency department 11/08/2022 with complaints of dysuria.  He had a history of recurrent UTIs with significant amount of urinary incontinence which he was wearing depends.  He had reported 5 days of discomfort.  He recently been treated with sulfa antibiotic.  Urine  culture and urinalysis were completed and he was reinitiated on antibiotic therapy with Keflex and sent back to the [facility].  His antibiotic had to be changed as organism in his urine culture was resistant to Keflex he was changed to Bactrim DS for 10 days.  Daughter-in-law called the triage line on 12/25/2022 with concerns with 12 pound weight increase since 11/11/2022 after a recent visit by PCP.  Family was to continue to monitor weights until his appointment.  He he returns to clinic today accompanied by his son and daughter-in-law.  He continues to complain of shortness of breath.  There is also questions about increased amount of urination during the night and they want to know if he is able to come off of his diuretic.  He has also been having weight gains of home upwards of 12 to 16 pounds since March of this year.  He denies any chest pain or palpitations.  He occasionally has swelling to his feet.  Denies any early satiety or orthopnea.  He is just recently followed up with his PCP and had blood work completed.  Unfortunately there is no BNP available to evaluate for failure.  He denies any recent hospitalizations or visits to the emergency department.  ROS: 10 point review of system has been reviewed and considered negative the exception of what is been listed in the HPI  Studies Reviewed: Marland Kitchen       TTE 08/12/22 1. Left ventricular ejection fraction, by estimation, is 50 to 55%. The  left ventricle has low normal function. Left ventricular endocardial  border not optimally defined to evaluate regional wall motion. There is  moderate left ventricular hypertrophy.  Left ventricular diastolic function could not be evaluated. The E/e' is  25.4. There is the interventricular septum is flattened in systole and  diastole, consistent with right ventricular pressure and volume overload.   2. Right ventricular systolic function is normal. The right ventricular  size is normal. There is severely  elevated pulmonary artery systolic  pressure. The estimated right ventricular systolic pressure is 61.3 mmHg.   3. Left atrial size was severely dilated.   4. A small pericardial effusion is present. The pericardial effusion is  localized near the left atrium.   5. The mitral valve is abnormal. Mild to moderate mitral valve  regurgitation. No evidence of mitral stenosis. Moderate to severe mitral  annular calcification.   6. The tricuspid valve is abnormal. Tricuspid valve regurgitation is  moderate.   7. The aortic valve is tricuspid. There is mild calcification of the  aortic valve. Aortic valve regurgitation is mild to moderate. No aortic  stenosis is present.   8. The inferior vena cava is dilated in size with >50% respiratory  variability, suggesting right atrial pressure of 8 mmHg.   TTE 03/05/22 1. Hypokinesis of the mid/distal inferoseptal wall. Left ventricular  ejection fraction, by estimation, is 50 to 55%. The left ventricle has low  normal function. There is severe asymmetric left ventricular hypertrophy.  Left ventricular diastolic  parameters are indeterminate.   2. Right ventricular systolic function is mildly reduced. The right  ventricular size is normal. There is mildly elevated pulmonary artery  systolic pressure.   3. Mild mitral valve regurgitation.   4. The aortic valve is tricuspid. Aortic valve regurgitation is mild.  Aortic valve sclerosis is present, with no evidence of aortic valve  stenosis.   Risk Assessment/Calculations:             Physical Exam:   VS:  BP 120/62   Pulse 64   Ht 5\' 7"  (1.702 m)   Wt 160 lb (72.6 kg)   SpO2 99%   BMI 25.06 kg/m    Wt Readings from Last 3 Encounters:  01/18/23 160 lb (72.6 kg)  11/08/22 151 lb (68.5 kg)  08/21/22 150 lb 3.2 oz (68.1 kg)    GEN: Well nourished, well developed in no acute distress NECK: No JVD; No carotid bruits CARDIAC: RRR, no murmurs, rubs, gallops RESPIRATORY: There are upper lobes with  fine bibasilar crackles in the bilateral bases noted to auscultation bilaterally, respirations are unlabored at rest on room air ABDOMEN: Soft, non-tender, non-distended EXTREMITIES:  No edema; No deformity   ASSESSMENT AND PLAN: .   Chronic HFpEF with worsening shortness of breath, weight gain, peripheral edema, and crackles noted on physical exam.  He is continued on furosemide 40 mg daily, metoprolol tartrate 50 mg twice daily, and lisinopril 10 mg twice daily.  Recently on chest x-ray he was noted to have pleural effusions prior to discharge from the hospital with recommendation of follow-up chest x-ray that has not been completed as of yet.  During hospitalization it was decided for conservative therapy for pleural effusions versus thoracentesis.  Chest x-ray is being repeated today.  He was also noted to have a small pericardial effusion on echocardiogram in March 2024 Limited echocardiogram is been ordered today to follow-up on heart function and effusion.  Last hemoglobin was 13.6.  Coming blood work with  PCP as already scheduled per patient's family.  Will recommend adding a BNP on next scheduled labs.  Encouraged to continue to weigh daily and continue to decrease his sodium intake in diet.  Not considered a good candidate for SGLT2 inhibitor due to issues with incontinence and not a candidate for MRA due to recent AKI.Marland Kitchen  Coronary artery disease status post CABG without angina.  He is continued on simvastatin and aspirin 81 mg daily.  No further ischemic workup needed at this time.  Primary hypertension with blood pressure of 120/62.  He is continued on furosemide 40 mg daily, lisinopril 10 mg twice daily, metoprolol titrate 50 mg twice daily.  Blood pressure is remained stable.  Encouraged to continue to monitor blood pressure 1 to 2 hours post medication administration.  Bilateral lower extremity edema with the left more than the right that is chronic.  Previous ultrasound revealed no DVT.   Swelling has improved with furosemide dosing.  Mixed hyperlipidemia with LDL 52 remains at goal of 55 or less.  He is continued on simvastatin therapy.  Labs continue to be monitored by his PCP.  Complete heart block status post permanent pacemaker with last remote upload in 7/24.  No changes were made device had stable function.  Next upload is scheduled to be remote in 3 months.  Type 2 diabetes continued on his current medication regimen.  This continues to be monitored by his PCP.        Dispo: Patient to return to clinic in 3-4 weeks to see MD/APP or sooner if needed to reevaluate symptoms  Signed, Hermilo Dutter, NP

## 2023-02-19 ENCOUNTER — Ambulatory Visit (HOSPITAL_COMMUNITY)
Admission: RE | Admit: 2023-02-19 | Discharge: 2023-02-19 | Disposition: A | Discharge status: Home / Self Care | Payer: Medicare PPO | Source: Ambulatory Visit | Attending: Cardiology | Admitting: Cardiology

## 2023-02-19 DIAGNOSIS — I3139 Other pericardial effusion (noninflammatory): Secondary | ICD-10-CM | POA: Diagnosis present

## 2023-02-19 LAB — ECHOCARDIOGRAM LIMITED: S' Lateral: 2.5 cm

## 2023-02-19 NOTE — Progress Notes (Addendum)
*  PRELIMINARY RESULTS* Echocardiogram Limited 2D Echocardiogram has been performed.  Stacey Drain 02/19/2023, 10:00 AM

## 2023-02-19 NOTE — Progress Notes (Signed)
Heart squeeze was noted to be 55-60%.  Normal wall motion noted during exam.  No evidence of pericardial effusion or extra fluid in the heart.  Overall reassuring study.  Continue current medication regimen without changes and keep her upcoming follow-up with Randall An, PA on 02/26/23.

## 2023-02-26 ENCOUNTER — Ambulatory Visit: Payer: 59 | Admitting: Student

## 2023-02-26 NOTE — Progress Notes (Deleted)
   Cardiology Office Note    Date:  02/26/2023  ID:  Guy, Roberson [birth year], MRN [id number] Cardiologist: Lewayne Bunting, MD    History of Present Illness:    Guy Roberson is a 89 y.o. male with past medical history of CHB (s/p Medtronic PPM placement in 11/2011), CAD (s/p CABG in 2011 with LIMA-LAD, SVG-D1, SVG-OM and SVG-PDA, cath in 2012 showing 4/4 patent grafts), seizures, HTN, HLD, Type 2 DM and dementia who presents to the office today for 6-week follow-up.  He was last examined by Charlsie Quest, NP in 12/2022 reporting a 12 pound weight gain within the past few months.  Reported associated shortness of breath but denied any chest pain or palpitations.  His weight was at 160 LBS and was recommended to obtain follow-up labs, CXR and repeat echocardiogram for further assessment.  CXR was obtained on 02/19/2023 with results pending at this time.  It does not appear that changes are made to his diuretic regimen and he was continued on Lasix 40 mg daily.  He did have a limited echocardiogram which showed his EF was normal at 55 to 60% with no regional wall motion abnormalities.  He did have severe asymmetric LVH and no evidence of a pericardial effusion as was noted on prior imaging.  ROS: ***  Studies Reviewed:   EKG: EKG is*** ordered today and demonstrates ***   EKG Interpretation Date/Time:    Ventricular Rate:    PR Interval:    QRS Duration:    QT Interval:    QTC Calculation:   R Axis:      Text Interpretation:         Echocardiogram: 01/2023 IMPRESSIONS     1. Limited Echo to evaluate for pericardial effusion. Doppler analysis  not performed.   2. Left ventricular ejection fraction, by estimation, is 55 to 60%. The  left ventricle has normal function. The left ventricle has no regional  wall motion abnormalities. There is severe asymmetric left ventricular  hypertrophy of the septal segment. Left   ventricular diastolic function could not be evaluated.   3. No  evidence of pericardial effusion.   Risk Assessment/Calculations:   {Does this patient have ATRIAL FIBRILLATION?:[phone]} No BP recorded.  {Refresh Note OR Click here to enter BP  :1}***         Physical Exam:   VS:  There were no vitals taken for this visit.   Wt Readings from Last 3 Encounters:  01/18/23 160 lb (72.6 kg)  11/08/22 151 lb (68.5 kg)  08/21/22 150 lb 3.2 oz (68.1 kg)     GEN: Well nourished, well developed in no acute distress NECK: No JVD; No carotid bruits CARDIAC: ***RRR, no murmurs, rubs, gallops RESPIRATORY:  Clear to auscultation without rales, wheezing or rhonchi  ABDOMEN: Appears non-distended. No obvious abdominal masses. EXTREMITIES: No clubbing or cyanosis. No edema.  Distal pedal pulses are 2+ bilaterally.   Assessment and Plan:      {Are you ordering a CV Procedure (e.g. stress test, cath, DCCV, TEE, etc)?   Press F2        :[id number]}   Signed, Ellsworth Lennox, PA-C

## 2023-03-09 ENCOUNTER — Encounter: Payer: Self-pay | Admitting: Student

## 2023-03-09 ENCOUNTER — Ambulatory Visit: Payer: 59 | Attending: Student | Admitting: Student

## 2023-03-09 VITALS — BP 118/68 | HR 80 | Ht 67.0 in | Wt 165.4 lb

## 2023-03-09 DIAGNOSIS — I2510 Atherosclerotic heart disease of native coronary artery without angina pectoris: Secondary | ICD-10-CM

## 2023-03-09 DIAGNOSIS — I10 Essential (primary) hypertension: Secondary | ICD-10-CM

## 2023-03-09 DIAGNOSIS — Z79899 Other long term (current) drug therapy: Secondary | ICD-10-CM | POA: Diagnosis not present

## 2023-03-09 DIAGNOSIS — I5032 Chronic diastolic (congestive) heart failure: Secondary | ICD-10-CM

## 2023-03-09 DIAGNOSIS — Z950 Presence of cardiac pacemaker: Secondary | ICD-10-CM

## 2023-03-09 DIAGNOSIS — E785 Hyperlipidemia, unspecified: Secondary | ICD-10-CM

## 2023-03-09 DIAGNOSIS — I351 Nonrheumatic aortic (valve) insufficiency: Secondary | ICD-10-CM

## 2023-03-09 MED ORDER — FUROSEMIDE 40 MG PO TABS: 60.0000 mg | ORAL_TABLET | Freq: Every day | ORAL | 3 refills | Status: DC

## 2023-03-09 NOTE — Patient Instructions (Signed)
Medication Instructions:  INCREASE Lasix to 60 mg daily   Labwork: BNP,BMET in 2 weeks   Testing/Procedures: None today  Follow-Up: 3-4 months  Any Other Special Instructions Will Be Listed Below (If Applicable).  If you need a refill on your cardiac medications before your next appointment, please call your pharmacy.

## 2023-03-09 NOTE — Progress Notes (Signed)
Cardiology Office Note    Date:  03/09/2023  ID:  Wash, Nienhaus May 12, 1932, MRN [id number] Cardiologist: Lewayne Bunting, MD    History of Present Illness:    Guy Roberson is a 89 y.o. male with past medical history of CHB (s/p Medtronic PPM placement in 11/2011), CAD (s/p CABG in 2011 with LIMA-LAD, SVG-D1, SVG-OM and SVG-PDA, cath in 2012 showing 4/4 patent grafts), seizures, HTN, HLD and Type 2 DM  who presents to the office today for 6-week follow-up.  He was last examined by Charlsie Quest, NP in 12/2022 and reported a 12 pound weight gain within the past few months. Reported associated shortness of breath but denied any chest pain or palpitations. His weight was at 160 lbs and was recommended to obtain follow-up labs, CXR and repeat echocardiogram for further assessment. It does not appear that changes were made to his diuretic regimen and he was continued on Lasix 40 mg daily. He did have a limited echocardiogram which showed his EF was normal at 55 to 60% with no regional wall motion abnormalities. He did have severe asymmetric LVH and no evidence of a pericardial effusion as was noted on prior imaging.  In talking with the patient, his son and daughter-in-law today they report that his activity is very limited as he typically sits in a wheelchair a majority of the time. This has led to worsening fatigue and shortness of breath over the past several months. He denies any specific exertional chest pain or palpitations. Does report dyspnea on exertion but no specific orthopnea or PND. Notes intermittent lower extremity edema despite taking Lasix 40 mg daily. They also report he has made significant dietary changes and is no longer consuming fast food routinely as this was previously a daily occurrence. Also reports some insomnia which has been occurring for several months. Previously intolerant to OTC medications (including Melatonin) as he developed AMS with this. He does plan to discuss with  his PCP at his upcoming visit in the next few weeks.  Studies Reviewed:   EKG: EKG is not ordered today.   Echocardiogram: 01/2023 IMPRESSIONS     1. Limited Echo to evaluate for pericardial effusion. Doppler analysis  not performed.   2. Left ventricular ejection fraction, by estimation, is 55 to 60%. The  left ventricle has normal function. The left ventricle has no regional  wall motion abnormalities. There is severe asymmetric left ventricular  hypertrophy of the septal segment. Left   ventricular diastolic function could not be evaluated.   3. No evidence of pericardial effusion.   Physical Exam:   VS:  BP 118/68   Pulse 80   Ht 5\' 7"  (1.702 m)   Wt 165 lb 6.4 oz (75 kg)   SpO2 99%   BMI 25.91 kg/m    Wt Readings from Last 3 Encounters:  03/09/23 165 lb 6.4 oz (75 kg)  01/18/23 160 lb (72.6 kg)  11/08/22 151 lb (68.5 kg)     GEN: Pleasant elderly male appearing in no acute distress NECK: No JVD; No carotid bruits CARDIAC: RRR, 2/6 systolic murmur along RUSB.  RESPIRATORY:  Rales along right base with scattered expiratory wheezing.  ABDOMEN: Appears non-distended. No obvious abdominal masses. EXTREMITIES: No clubbing or cyanosis. 1+ pitting edema bilaterally.  Distal pedal pulses are 2+ bilaterally.   Assessment and Plan:   1. Chronic HFpEF - He reports dyspnea on exertion and I suspect this is multifactorial in the setting of fluid overload, deconditioning  and wheezing on examination. He was previously prescribed Albuterol by his PCP and I encouraged him to review this with him at his visit in the next few weeks as he may benefit from adjustments in this. - We reviewed the importance of gradually increasing his activity as he is now more sedentary and sitting in a wheelchair throughout the day which I think is playing a significant role in his symptoms. Encouraged him to ambulate with his walker throughout the day. Given his volume overload, will titrate Lasix from  40 mg daily to 60 mg daily. Repeat BNP and BMET in 2 weeks.   2. CAD - He underwent CABG in 2011 with details as outlined above and catheterization in 2012 did show 4 out of 4 patent grafts. Echocardiogram last month showed a preserved EF with no regional wall motion abnormalities. - He denies any recent chest pain but does report dyspnea which is likely multifactorial as outlined above. Continue ASA 81 mg daily, Simvastatin 20 mg daily and Lopressor 50 mg twice daily.  3. CHB - He has a Medtronic pacemaker in place which is followed by Dr. Ladona Ridgel. Device interrogation in 11/2022 showed normal device function.  4. HTN - BP is well-controlled at 118/68 during today's visit. Continue current medical therapy with Lisinopril 10 mg twice daily and Lopressor 50 mg twice daily.  5. HLD - LDL was at 52 in 11/2022. Continue current medical therapy with Simvastatin 20 mg daily.  6. Aortic Valve Regurgitation - This was mild to moderate by echocardiogram in 07/2022. Can obtain follow-up imaging in 1 year.   Signed, Ellsworth Lennox, PA-C

## 2023-03-10 NOTE — Progress Notes (Signed)
Heart size was normal and no extra fluid noted around the heart or lungs.

## 2023-04-23 ENCOUNTER — Ambulatory Visit: Payer: 59 | Admitting: Student

## 2023-04-23 ENCOUNTER — Telehealth: Payer: Self-pay

## 2023-04-23 NOTE — Telephone Encounter (Signed)
I spoke with the patient daughter Cala Bradford. I ordered the patient a new monitor. He should receive it in 7-10 business days. Return kit ordered for old monitor.

## 2023-05-06 ENCOUNTER — Telehealth: Payer: Self-pay | Admitting: *Deleted

## 2023-05-06 NOTE — Telephone Encounter (Signed)
Pt and daughter notified.

## 2023-05-06 NOTE — Telephone Encounter (Signed)
-----   Message from Ellsworth Lennox sent at 04/22/2023  7:27 AM EST ----- Please let the patient know that I reviewed recent labs from his PCP and his kidney function remains stable following recent dose adjustment of Lasix. His BNP (fluid level) was elevated to 326 with normal being less than 100, therefore I would continue on higher dose of Lasix for now.  Thanks,  [place] ----- Message ----- From: Aurora Mask Sent: 04/21/2023   5:30 PM EST To: Ellsworth Lennox, PA-C

## 2023-05-17 DIAGNOSIS — N3020 Other chronic cystitis without hematuria: Secondary | ICD-10-CM | POA: Diagnosis not present

## 2023-05-17 DIAGNOSIS — N312 Flaccid neuropathic bladder, not elsewhere classified: Secondary | ICD-10-CM | POA: Diagnosis not present

## 2023-05-19 DIAGNOSIS — Z23 Encounter for immunization: Secondary | ICD-10-CM | POA: Diagnosis not present

## 2023-06-10 NOTE — Progress Notes (Signed)
 Cardiology Office Note    Date:  06/12/2023  ID:  Belton, Peplinski 1931-07-11, MRN [id number] Cardiologist: Danelle Birmingham, MD    History of Present Illness:    Guy Roberson is a 89 y.o. male with past medical history of CHB (s/p Medtronic PPM placement in 11/2011), CAD (s/p CABG in 2011 with LIMA-LAD, SVG-D1, SVG-OM and SVG-PDA, cath in 2012 showing 4/4 patent grafts), seizures, HTN, HLD and Type 2 DM who presents to the office today for 24-month follow-up.  He was examined by myself in 03/2023 and reported activity was overall very limited and he was sitting in a wheelchair a majority of the time. He did report worsening dyspnea and lower extremity edema despite taking Lasix  40 mg daily. Family did report that he had made significant dietary changes and was no longer consuming fast food. He did have 1+ pitting edema on examination and he was encouraged to elevate his lower extremities and ambulate with his walker throughout the day. Lasix  was increased to 60 mg daily and follow-up BNP and BMET were recommended in 2 weeks. Follow-up labs showed his BNP was elevated at 326 but kidney function and electrolytes remained stable, therefore he was continued on his current dose of Lasix .  In talking with the patient, his son and his daughter-in-law today, they report that things have been stable since his last office visit. He is not very active at baseline and either uses a walker or wheelchair at his home. He lives there with his wife who has dementia. His son and daughter-in-law check on them very frequently and provide a majority of the meals. They do try to limit his sodium intake. He has baseline dyspnea on exertion but no acute changes in this. No recent chest pain, palpitations, orthopnea or PND.  They report his lower extremity edema has improved since his last office visit and this is typically isolated to the top of his feet. He does not like to elevate his lower extremities routinely.  Studies  Reviewed:   EKG: EKG is not ordered today.   Limited Echo: 01/2023 IMPRESSIONS     1. Limited Echo to evaluate for pericardial effusion. Doppler analysis  not performed.   2. Left ventricular ejection fraction, by estimation, is 55 to 60%. The  left ventricle has normal function. The left ventricle has no regional  wall motion abnormalities. There is severe asymmetric left ventricular  hypertrophy of the septal segment. Left   ventricular diastolic function could not be evaluated.   3. No evidence of pericardial effusion.    Physical Exam:   VS:  BP (!) 116/58   Pulse 75   Ht 5' 7 (1.702 m)   Wt 163 lb 9.6 oz (74.2 kg)   SpO2 96%   BMI 25.62 kg/m    Wt Readings from Last 3 Encounters:  06/11/23 163 lb 9.6 oz (74.2 kg)  03/09/23 165 lb 6.4 oz (75 kg)  01/18/23 160 lb (72.6 kg)     GEN: Pleasant, elderly male appearing in no acute distress NECK: No JVD; No carotid bruits CARDIAC: RRR, no murmurs, rubs, gallops RESPIRATORY:  Clear to auscultation without rales, wheezing or rhonchi  ABDOMEN: Appears non-distended. No obvious abdominal masses. EXTREMITIES: No clubbing or cyanosis. No pitting edema.  Distal pedal pulses are 2+ bilaterally.   Assessment and Plan:   1. Chronic HFpEF - Recent echocardiogram in 01/2023 showed a preserved EF of 55 to 60%. Volume status has significantly improved since his last  examination. Will continue current medical therapy with Lasix  60 mg daily. He does weigh himself daily and I encouraged him to reach out if he has an acute weight gain of more than 3 pounds overnight or 5 pounds in 1 week. They were encouraged to continue to limit sodium intake. Would not use an SGLT2 inhibitor given his history of frequent UTI's.  2. CAD - He underwent CABG in 2011 and did have 4/4 patent grafts by catheterization in 2012. His activity is limited but he denies any recent anginal symptoms. - Continue current medical therapy with ASA 81 mg daily, Lopressor   50 mg twice daily and Simvastatin  20 mg daily.  3. CHB - He does have a Medtronic pacemaker which is followed by Dr. Waddell. He is scheduled for a remote check later this month and has in person follow-up with Dr. Waddell in 07/2023.  4. Aortic Valve Regurgitation - Echocardiogram in 07/2022 showed mild to moderate AI. Can consider follow-up imaging later this year for reassessment.  5. HTN - Blood pressure is well-controlled at 116/58 during today's visit. Continue Lisinopril  10 mg twice daily and Lopressor  50 mg twice daily.  6. HLD - LDL was at 52 in 11/2022. Continue current medical therapy with Simvastatin  20 mg daily.  Signed, Laymon CHRISTELLA Qua, PA-C

## 2023-06-11 ENCOUNTER — Ambulatory Visit: Payer: 59 | Attending: Student | Admitting: Student

## 2023-06-11 ENCOUNTER — Encounter: Payer: Self-pay | Admitting: Student

## 2023-06-11 ENCOUNTER — Ambulatory Visit: Payer: 59 | Admitting: Student

## 2023-06-11 VITALS — BP 116/58 | HR 75 | Ht 67.0 in | Wt 163.6 lb

## 2023-06-11 DIAGNOSIS — E785 Hyperlipidemia, unspecified: Secondary | ICD-10-CM

## 2023-06-11 DIAGNOSIS — I2510 Atherosclerotic heart disease of native coronary artery without angina pectoris: Secondary | ICD-10-CM

## 2023-06-11 DIAGNOSIS — I10 Essential (primary) hypertension: Secondary | ICD-10-CM

## 2023-06-11 DIAGNOSIS — I351 Nonrheumatic aortic (valve) insufficiency: Secondary | ICD-10-CM | POA: Diagnosis not present

## 2023-06-11 DIAGNOSIS — I5032 Chronic diastolic (congestive) heart failure: Secondary | ICD-10-CM | POA: Diagnosis not present

## 2023-06-11 DIAGNOSIS — I442 Atrioventricular block, complete: Secondary | ICD-10-CM

## 2023-06-11 MED ORDER — FUROSEMIDE 40 MG PO TABS: 60.0000 mg | ORAL_TABLET | Freq: Every day | ORAL | 3 refills | Status: DC

## 2023-06-11 NOTE — Patient Instructions (Signed)
 Medication Instructions:  Your physician recommends that you continue on your current medications as directed. Please refer to the Current Medication list given to you today.  *If you need a refill on your cardiac medications before your next appointment, please call your pharmacy*   Lab Work: NONE   If you have labs (blood work) drawn today and your tests are completely normal, you will receive your results only by: MyChart Message (if you have MyChart) OR A paper copy in the mail If you have any lab test that is abnormal or we need to change your treatment, we will call you to review the results.   Testing/Procedures: NONE    Follow-Up: At [facility], you and your health needs are our priority.  As part of our continuing mission to provide you with exceptional heart care, we have created designated Provider Care Teams.  These Care Teams include your primary Cardiologist (physician) and Advanced Practice Providers (APPs -  Physician Assistants and Nurse Practitioners) who all work together to provide you with the care you need, when you need it.  We recommend signing up for the patient portal called MyChart.  Sign up information is provided on this After Visit Summary.  MyChart is used to connect with patients for Virtual Visits (Telemedicine).  Patients are able to view lab/test results, encounter notes, upcoming appointments, etc.  Non-urgent messages can be sent to your provider as well.   To learn more about what you can do with MyChart, go to [email or web address].    Your next appointment:    As Scheduled   Provider:   Danelle Birmingham, MD    Other Instructions Thank you for choosing [facility]!

## 2023-06-12 ENCOUNTER — Encounter: Payer: Self-pay | Admitting: Student

## 2023-06-25 ENCOUNTER — Ambulatory Visit (INDEPENDENT_AMBULATORY_CARE_PROVIDER_SITE_OTHER): Payer: 59

## 2023-06-25 DIAGNOSIS — I442 Atrioventricular block, complete: Secondary | ICD-10-CM | POA: Diagnosis not present

## 2023-06-25 LAB — CUP PACEART REMOTE DEVICE CHECK
Battery Impedance: 3608 Ohm
Battery Remaining Longevity: 14 mo
Battery Voltage: 2.69 V
Brady Statistic AP VP Percent: 97 %
Brady Statistic AP VS Percent: 0 %
Brady Statistic AS VP Percent: 3 %
Brady Statistic AS VS Percent: 0 %
Date Time Interrogation Session: 20250124095556
Implantable Lead Connection Status: 753985
Implantable Lead Connection Status: 753985
Implantable Lead Implant Date: 20130730
Implantable Lead Implant Date: 20130730
Implantable Lead Location: 753859
Implantable Lead Location: 753860
Implantable Lead Model: 5076
Implantable Lead Model: 5092
Implantable Pulse Generator Implant Date: 20130730
Lead Channel Impedance Value: 513 Ohm
Lead Channel Impedance Value: 558 Ohm
Lead Channel Setting Pacing Amplitude: 2.5 V
Lead Channel Setting Pacing Amplitude: 2.5 V
Lead Channel Setting Pacing Pulse Width: 0.4 ms
Lead Channel Setting Sensing Sensitivity: 4 mV
Zone Setting Status: 755011
Zone Setting Status: 755011

## 2023-07-15 ENCOUNTER — Telehealth: Payer: Self-pay | Admitting: Internal Medicine

## 2023-07-15 ENCOUNTER — Other Ambulatory Visit: Payer: Self-pay | Admitting: Student

## 2023-07-15 NOTE — Telephone Encounter (Signed)
This is Dr. Lubertha Basque pt. Does Dr. Ladona Ridgel want to refill? Please advise

## 2023-07-15 NOTE — Telephone Encounter (Signed)
*  STAT* If patient is at the pharmacy, call can be transferred to refill team.   1. Which medications need to be refilled? (please list name of each medication and dose if known)   albuterol (VENTOLIN HFA) 108 (90 Base) MCG/ACT inhaler   2. Would you like to learn more about the convenience, safety, & potential cost savings by using the [facility]?   3. Are you open to using the [facility] (Type [facility].)   4. Which pharmacy/location (including street and city if local pharmacy) is medication to be sent to?  [facility] - [place], [place] - [address]   5. Do they need a 30 day or 90 day supply?   90 day  Caller Synetta Fail) stated patient is running low on this medication.

## 2023-07-28 ENCOUNTER — Ambulatory Visit: Payer: 59 | Attending: Internal Medicine | Admitting: Internal Medicine

## 2023-07-28 ENCOUNTER — Encounter: Payer: Self-pay | Admitting: Internal Medicine

## 2023-07-28 VITALS — BP 130/78 | HR 82 | Ht 68.0 in | Wt 164.4 lb

## 2023-07-28 DIAGNOSIS — I5032 Chronic diastolic (congestive) heart failure: Secondary | ICD-10-CM

## 2023-07-28 DIAGNOSIS — I442 Atrioventricular block, complete: Secondary | ICD-10-CM | POA: Diagnosis not present

## 2023-07-28 MED ORDER — ALBUTEROL SULFATE HFA 108 (90 BASE) MCG/ACT IN AERS: 2.0000 | INHALATION_SPRAY | Freq: Four times a day (QID) | RESPIRATORY_TRACT | 0 refills | Status: DC | PRN

## 2023-07-28 NOTE — Patient Instructions (Signed)
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
## 2023-08-02 LAB — CUP PACEART INCLINIC DEVICE CHECK
Date Time Interrogation Session: 20250226205411
Implantable Lead Connection Status: 753985
Implantable Lead Connection Status: 753985
Implantable Lead Implant Date: 20130730
Implantable Lead Implant Date: 20130730
Implantable Lead Location: 753859
Implantable Lead Location: 753860
Implantable Lead Model: 5076
Implantable Lead Model: 5092
Implantable Pulse Generator Implant Date: 20130730
Lead Channel Impedance Value: 516 Ohm
Lead Channel Impedance Value: 584 Ohm
Lead Channel Pacing Threshold Amplitude: 0.5 V
Lead Channel Pacing Threshold Amplitude: 1.25 V

## 2023-08-04 NOTE — Progress Notes (Signed)
 Remote pacemaker transmission.

## 2023-09-15 DIAGNOSIS — R8271 Bacteriuria: Secondary | ICD-10-CM | POA: Diagnosis not present

## 2023-09-15 DIAGNOSIS — N3020 Other chronic cystitis without hematuria: Secondary | ICD-10-CM | POA: Diagnosis not present

## 2023-10-21 DIAGNOSIS — E1165 Type 2 diabetes mellitus with hyperglycemia: Secondary | ICD-10-CM | POA: Diagnosis not present

## 2023-10-21 DIAGNOSIS — I10 Essential (primary) hypertension: Secondary | ICD-10-CM | POA: Diagnosis not present

## 2023-10-27 ENCOUNTER — Encounter: Payer: Self-pay | Admitting: Family Medicine

## 2023-10-27 DIAGNOSIS — I509 Heart failure, unspecified: Secondary | ICD-10-CM | POA: Diagnosis not present

## 2023-10-27 DIAGNOSIS — Z950 Presence of cardiac pacemaker: Secondary | ICD-10-CM | POA: Diagnosis not present

## 2023-10-27 DIAGNOSIS — K219 Gastro-esophageal reflux disease without esophagitis: Secondary | ICD-10-CM | POA: Diagnosis not present

## 2023-10-27 DIAGNOSIS — I130 Hypertensive heart and chronic kidney disease with heart failure and stage 1 through stage 4 chronic kidney disease, or unspecified chronic kidney disease: Secondary | ICD-10-CM | POA: Diagnosis not present

## 2023-10-27 DIAGNOSIS — I10 Essential (primary) hypertension: Secondary | ICD-10-CM | POA: Diagnosis not present

## 2023-10-27 DIAGNOSIS — N400 Enlarged prostate without lower urinary tract symptoms: Secondary | ICD-10-CM | POA: Diagnosis not present

## 2023-10-27 DIAGNOSIS — R609 Edema, unspecified: Secondary | ICD-10-CM | POA: Diagnosis not present

## 2023-10-27 DIAGNOSIS — E782 Mixed hyperlipidemia: Secondary | ICD-10-CM | POA: Diagnosis not present

## 2023-10-27 DIAGNOSIS — E1165 Type 2 diabetes mellitus with hyperglycemia: Secondary | ICD-10-CM | POA: Diagnosis not present

## 2023-12-28 ENCOUNTER — Ambulatory Visit (INDEPENDENT_AMBULATORY_CARE_PROVIDER_SITE_OTHER)

## 2023-12-28 DIAGNOSIS — I442 Atrioventricular block, complete: Secondary | ICD-10-CM | POA: Diagnosis not present

## 2023-12-30 LAB — CUP PACEART REMOTE DEVICE CHECK
Battery Impedance: 5835 Ohm
Battery Remaining Longevity: 2 mo
Battery Voltage: 2.63 V
Brady Statistic AP VP Percent: 98 %
Brady Statistic AP VS Percent: 0 %
Brady Statistic AS VP Percent: 2 %
Brady Statistic AS VS Percent: 0 %
Date Time Interrogation Session: 20250729122917
Implantable Lead Connection Status: 753985
Implantable Lead Connection Status: 753985
Implantable Lead Implant Date: 20130730
Implantable Lead Implant Date: 20130730
Implantable Lead Location: 753859
Implantable Lead Location: 753860
Implantable Lead Model: 5076
Implantable Lead Model: 5092
Implantable Pulse Generator Implant Date: 20130730
Lead Channel Impedance Value: 554 Ohm
Lead Channel Impedance Value: 660 Ohm
Lead Channel Setting Pacing Amplitude: 2.5 V
Lead Channel Setting Pacing Amplitude: 2.5 V
Lead Channel Setting Pacing Pulse Width: 0.4 ms
Lead Channel Setting Sensing Sensitivity: 4 mV
Zone Setting Status: 755011
Zone Setting Status: 755011

## 2023-12-31 ENCOUNTER — Ambulatory Visit: Payer: Self-pay | Admitting: Internal Medicine

## 2024-01-03 ENCOUNTER — Telehealth: Payer: Self-pay

## 2024-01-03 NOTE — Telephone Encounter (Signed)
 Up to date, next review in September

## 2024-01-21 DIAGNOSIS — N3020 Other chronic cystitis without hematuria: Secondary | ICD-10-CM | POA: Diagnosis not present

## 2024-01-21 DIAGNOSIS — R3914 Feeling of incomplete bladder emptying: Secondary | ICD-10-CM | POA: Diagnosis not present

## 2024-01-24 DIAGNOSIS — R399 Unspecified symptoms and signs involving the genitourinary system: Secondary | ICD-10-CM | POA: Diagnosis not present

## 2024-01-27 ENCOUNTER — Encounter (HOSPITAL_COMMUNITY): Payer: Self-pay | Admitting: Internal Medicine

## 2024-01-27 ENCOUNTER — Other Ambulatory Visit: Payer: Self-pay

## 2024-01-27 ENCOUNTER — Emergency Department (HOSPITAL_COMMUNITY)

## 2024-01-27 ENCOUNTER — Inpatient Hospital Stay (HOSPITAL_COMMUNITY)
Admission: EM | Admit: 2024-01-27 | Discharge: 2024-02-02 | DRG: 682 | Disposition: A | Discharge status: Skilled Nursing Facility | Attending: Family Medicine | Admitting: Family Medicine

## 2024-01-27 DIAGNOSIS — R0902 Hypoxemia: Secondary | ICD-10-CM | POA: Diagnosis not present

## 2024-01-27 DIAGNOSIS — G9341 Metabolic encephalopathy: Secondary | ICD-10-CM | POA: Diagnosis not present

## 2024-01-27 DIAGNOSIS — Z951 Presence of aortocoronary bypass graft: Secondary | ICD-10-CM

## 2024-01-27 DIAGNOSIS — N179 Acute kidney failure, unspecified: Secondary | ICD-10-CM | POA: Diagnosis not present

## 2024-01-27 DIAGNOSIS — I700 Atherosclerosis of aorta: Secondary | ICD-10-CM | POA: Diagnosis not present

## 2024-01-27 DIAGNOSIS — N170 Acute kidney failure with tubular necrosis: Secondary | ICD-10-CM | POA: Diagnosis not present

## 2024-01-27 DIAGNOSIS — I6782 Cerebral ischemia: Secondary | ICD-10-CM | POA: Diagnosis not present

## 2024-01-27 DIAGNOSIS — Z801 Family history of malignant neoplasm of trachea, bronchus and lung: Secondary | ICD-10-CM | POA: Diagnosis not present

## 2024-01-27 DIAGNOSIS — E1122 Type 2 diabetes mellitus with diabetic chronic kidney disease: Secondary | ICD-10-CM | POA: Diagnosis present

## 2024-01-27 DIAGNOSIS — N1831 Chronic kidney disease, stage 3a: Secondary | ICD-10-CM | POA: Diagnosis present

## 2024-01-27 DIAGNOSIS — S59909A Unspecified injury of unspecified elbow, initial encounter: Secondary | ICD-10-CM | POA: Diagnosis not present

## 2024-01-27 DIAGNOSIS — Z8249 Family history of ischemic heart disease and other diseases of the circulatory system: Secondary | ICD-10-CM

## 2024-01-27 DIAGNOSIS — E860 Dehydration: Secondary | ICD-10-CM | POA: Diagnosis present

## 2024-01-27 DIAGNOSIS — K219 Gastro-esophageal reflux disease without esophagitis: Secondary | ICD-10-CM | POA: Diagnosis present

## 2024-01-27 DIAGNOSIS — I152 Hypertension secondary to endocrine disorders: Secondary | ICD-10-CM | POA: Diagnosis not present

## 2024-01-27 DIAGNOSIS — E876 Hypokalemia: Secondary | ICD-10-CM | POA: Diagnosis not present

## 2024-01-27 DIAGNOSIS — I130 Hypertensive heart and chronic kidney disease with heart failure and stage 1 through stage 4 chronic kidney disease, or unspecified chronic kidney disease: Secondary | ICD-10-CM | POA: Diagnosis present

## 2024-01-27 DIAGNOSIS — I5032 Chronic diastolic (congestive) heart failure: Secondary | ICD-10-CM | POA: Diagnosis present

## 2024-01-27 DIAGNOSIS — I10 Essential (primary) hypertension: Secondary | ICD-10-CM | POA: Diagnosis not present

## 2024-01-27 DIAGNOSIS — Z043 Encounter for examination and observation following other accident: Secondary | ICD-10-CM | POA: Diagnosis not present

## 2024-01-27 DIAGNOSIS — E785 Hyperlipidemia, unspecified: Secondary | ICD-10-CM | POA: Diagnosis not present

## 2024-01-27 DIAGNOSIS — G9340 Encephalopathy, unspecified: Secondary | ICD-10-CM | POA: Diagnosis not present

## 2024-01-27 DIAGNOSIS — Z66 Do not resuscitate: Secondary | ICD-10-CM | POA: Diagnosis present

## 2024-01-27 DIAGNOSIS — Y92008 Other place in unspecified non-institutional (private) residence as the place of occurrence of the external cause: Secondary | ICD-10-CM | POA: Diagnosis not present

## 2024-01-27 DIAGNOSIS — M47811 Spondylosis without myelopathy or radiculopathy, occipito-atlanto-axial region: Secondary | ICD-10-CM | POA: Diagnosis not present

## 2024-01-27 DIAGNOSIS — W1830XA Fall on same level, unspecified, initial encounter: Secondary | ICD-10-CM | POA: Diagnosis present

## 2024-01-27 DIAGNOSIS — F17220 Nicotine dependence, chewing tobacco, uncomplicated: Secondary | ICD-10-CM | POA: Diagnosis present

## 2024-01-27 DIAGNOSIS — I2510 Atherosclerotic heart disease of native coronary artery without angina pectoris: Secondary | ICD-10-CM | POA: Diagnosis present

## 2024-01-27 DIAGNOSIS — M50223 Other cervical disc displacement at C6-C7 level: Secondary | ICD-10-CM | POA: Diagnosis not present

## 2024-01-27 DIAGNOSIS — J811 Chronic pulmonary edema: Secondary | ICD-10-CM | POA: Diagnosis not present

## 2024-01-27 DIAGNOSIS — Z79899 Other long term (current) drug therapy: Secondary | ICD-10-CM | POA: Diagnosis not present

## 2024-01-27 DIAGNOSIS — R9389 Abnormal findings on diagnostic imaging of other specified body structures: Secondary | ICD-10-CM | POA: Diagnosis not present

## 2024-01-27 DIAGNOSIS — R0989 Other specified symptoms and signs involving the circulatory and respiratory systems: Secondary | ICD-10-CM | POA: Diagnosis not present

## 2024-01-27 DIAGNOSIS — N390 Urinary tract infection, site not specified: Secondary | ICD-10-CM | POA: Diagnosis not present

## 2024-01-27 DIAGNOSIS — E049 Nontoxic goiter, unspecified: Secondary | ICD-10-CM | POA: Diagnosis not present

## 2024-01-27 DIAGNOSIS — M47812 Spondylosis without myelopathy or radiculopathy, cervical region: Secondary | ICD-10-CM | POA: Diagnosis not present

## 2024-01-27 DIAGNOSIS — J42 Unspecified chronic bronchitis: Secondary | ICD-10-CM | POA: Diagnosis not present

## 2024-01-27 DIAGNOSIS — N400 Enlarged prostate without lower urinary tract symptoms: Secondary | ICD-10-CM | POA: Diagnosis present

## 2024-01-27 DIAGNOSIS — I959 Hypotension, unspecified: Secondary | ICD-10-CM | POA: Diagnosis not present

## 2024-01-27 DIAGNOSIS — Z880 Allergy status to penicillin: Secondary | ICD-10-CM

## 2024-01-27 DIAGNOSIS — Z950 Presence of cardiac pacemaker: Secondary | ICD-10-CM

## 2024-01-27 DIAGNOSIS — R918 Other nonspecific abnormal finding of lung field: Secondary | ICD-10-CM | POA: Diagnosis not present

## 2024-01-27 DIAGNOSIS — J984 Other disorders of lung: Secondary | ICD-10-CM | POA: Diagnosis not present

## 2024-01-27 DIAGNOSIS — N189 Chronic kidney disease, unspecified: Secondary | ICD-10-CM | POA: Diagnosis not present

## 2024-01-27 LAB — URINALYSIS, W/ REFLEX TO CULTURE (INFECTION SUSPECTED)
Bacteria, UA: NONE SEEN
Bilirubin Urine: NEGATIVE
Glucose, UA: 150 mg/dL — AB
Hgb urine dipstick: NEGATIVE
Ketones, ur: 5 mg/dL — AB
Nitrite: NEGATIVE
Protein, ur: 30 mg/dL — AB
Specific Gravity, Urine: 1.015 (ref 1.005–1.030)
pH: 5 (ref 5.0–8.0)

## 2024-01-27 LAB — CBC WITH DIFFERENTIAL/PLATELET
Abs Immature Granulocytes: 0.08 K/uL — ABNORMAL HIGH (ref 0.00–0.07)
Basophils Absolute: 0 K/uL (ref 0.0–0.1)
Basophils Relative: 0 %
Eosinophils Absolute: 0 K/uL (ref 0.0–0.5)
Eosinophils Relative: 0 %
HCT: 36 % — ABNORMAL LOW (ref 39.0–52.0)
Hemoglobin: 12.1 g/dL — ABNORMAL LOW (ref 13.0–17.0)
Immature Granulocytes: 1 %
Lymphocytes Relative: 8 %
Lymphs Abs: 0.9 K/uL (ref 0.7–4.0)
MCH: 31.4 pg (ref 26.0–34.0)
MCHC: 33.6 g/dL (ref 30.0–36.0)
MCV: 93.5 fL (ref 80.0–100.0)
Monocytes Absolute: 0.8 K/uL (ref 0.1–1.0)
Monocytes Relative: 7 %
Neutro Abs: 8.6 K/uL — ABNORMAL HIGH (ref 1.7–7.7)
Neutrophils Relative %: 84 %
Platelets: 148 K/uL — ABNORMAL LOW (ref 150–400)
RBC: 3.85 MIL/uL — ABNORMAL LOW (ref 4.22–5.81)
RDW: 14.5 % (ref 11.5–15.5)
WBC: 10.3 K/uL (ref 4.0–10.5)
nRBC: 0 % (ref 0.0–0.2)

## 2024-01-27 LAB — GLUCOSE, CAPILLARY
Glucose-Capillary: 188 mg/dL — ABNORMAL HIGH (ref 70–99)
Glucose-Capillary: 109 mg/dL — ABNORMAL HIGH (ref 70–99)
Glucose-Capillary: 120 mg/dL — ABNORMAL HIGH (ref 70–99)

## 2024-01-27 LAB — TSH: TSH: 0.829 u[IU]/mL (ref 0.350–4.500)

## 2024-01-27 LAB — COMPREHENSIVE METABOLIC PANEL WITH GFR
ALT: 26 U/L (ref 0–44)
AST: 29 U/L (ref 15–41)
Albumin: 4 g/dL (ref 3.5–5.0)
Alkaline Phosphatase: 77 U/L (ref 38–126)
Anion gap: 14 (ref 5–15)
BUN: 37 mg/dL — ABNORMAL HIGH (ref 8–23)
CO2: 19 mmol/L — ABNORMAL LOW (ref 22–32)
Calcium: 10.1 mg/dL (ref 8.9–10.3)
Chloride: 103 mmol/L (ref 98–111)
Creatinine, Ser: 1.93 mg/dL — ABNORMAL HIGH (ref 0.61–1.24)
GFR, Estimated: 33 mL/min — ABNORMAL LOW (ref >60)
Glucose, Bld: 140 mg/dL — ABNORMAL HIGH (ref 70–99)
Potassium: 4 mmol/L (ref 3.5–5.1)
Sodium: 136 mmol/L (ref 135–145)
Total Bilirubin: 0.8 mg/dL (ref 0.0–1.2)
Total Protein: 6.9 g/dL (ref 6.5–8.1)

## 2024-01-27 LAB — VITAMIN B12: Vitamin B-12: 412 pg/mL (ref 180–914)

## 2024-01-27 LAB — MAGNESIUM: Magnesium: 2 mg/dL (ref 1.7–2.4)

## 2024-01-27 LAB — BRAIN NATRIURETIC PEPTIDE: B Natriuretic Peptide: 726 pg/mL — ABNORMAL HIGH (ref 0.0–100.0)

## 2024-01-27 LAB — AMMONIA: Ammonia: 18 umol/L (ref 9–35)

## 2024-01-27 LAB — PHOSPHORUS: Phosphorus: 2.6 mg/dL (ref 2.5–4.6)

## 2024-01-27 LAB — VITAMIN D 25 HYDROXY (VIT D DEFICIENCY, FRACTURES): Vit D, 25-Hydroxy: 55.79 ng/mL (ref 30–100)

## 2024-01-27 LAB — ETHANOL: Alcohol, Ethyl (B): <15 mg/dL (ref <15)

## 2024-01-27 MED ORDER — TAMSULOSIN HCL 0.4 MG PO CAPS
0.4000 mg | ORAL_CAPSULE | Freq: Every day | ORAL | Status: DC
  Administered 2024-01-27 – 2024-02-01 (×6): 0.4 mg via ORAL
  Filled 2024-01-27 (×6): qty 1

## 2024-01-27 MED ORDER — SODIUM CHLORIDE 0.9 % IV SOLN
1.0000 g | INTRAVENOUS | Status: DC
  Administered 2024-01-28 – 2024-01-30 (×3): 1 g via INTRAVENOUS
  Filled 2024-01-27 (×4): qty 10

## 2024-01-27 MED ORDER — HEPARIN SODIUM (PORCINE) 5000 UNIT/ML IJ SOLN
5000.0000 [IU] | Freq: Three times a day (TID) | INTRAMUSCULAR | Status: DC
  Administered 2024-01-27 – 2024-02-02 (×18): 5000 [IU] via SUBCUTANEOUS
  Filled 2024-01-27 (×18): qty 1

## 2024-01-27 MED ORDER — IPRATROPIUM-ALBUTEROL 0.5-2.5 (3) MG/3ML IN SOLN
RESPIRATORY_TRACT | Status: AC
  Filled 2024-01-27: qty 3

## 2024-01-27 MED ORDER — ACETAMINOPHEN 650 MG RE SUPP: 650.0000 mg | Freq: Four times a day (QID) | RECTAL | Status: DC | PRN

## 2024-01-27 MED ORDER — SODIUM CHLORIDE 0.9 % IV SOLN: INTRAVENOUS | Status: AC

## 2024-01-27 MED ORDER — SIMVASTATIN 20 MG PO TABS
20.0000 mg | ORAL_TABLET | Freq: Every day | ORAL | Status: DC
  Administered 2024-01-27 – 2024-02-02 (×7): 20 mg via ORAL
  Filled 2024-01-27 (×7): qty 1

## 2024-01-27 MED ORDER — METOPROLOL TARTRATE 50 MG PO TABS
50.0000 mg | ORAL_TABLET | Freq: Two times a day (BID) | ORAL | Status: DC
  Administered 2024-01-27 – 2024-01-28 (×4): 50 mg via ORAL
  Filled 2024-01-27 (×4): qty 1

## 2024-01-27 MED ORDER — PANTOPRAZOLE SODIUM 40 MG PO TBEC
40.0000 mg | DELAYED_RELEASE_TABLET | Freq: Every day | ORAL | Status: DC
Start: 2024-01-27 — End: 2024-02-02
  Administered 2024-01-27 – 2024-02-02 (×7): 40 mg via ORAL
  Filled 2024-01-27 (×7): qty 1

## 2024-01-27 MED ORDER — LACTATED RINGERS IV BOLUS
500.0000 mL | Freq: Once | INTRAVENOUS | Status: AC
  Administered 2024-01-27: 500 mL via INTRAVENOUS

## 2024-01-27 MED ORDER — ACETAMINOPHEN 325 MG PO TABS
650.0000 mg | ORAL_TABLET | Freq: Four times a day (QID) | ORAL | Status: DC | PRN
  Administered 2024-01-27 – 2024-02-01 (×7): 650 mg via ORAL
  Filled 2024-01-27 (×7): qty 2

## 2024-01-27 MED ORDER — INSULIN ASPART 100 UNIT/ML IJ SOLN
0.0000 [IU] | Freq: Three times a day (TID) | INTRAMUSCULAR | Status: DC
  Administered 2024-01-27: 2 [IU] via SUBCUTANEOUS
  Administered 2024-01-28: 3 [IU] via SUBCUTANEOUS
  Administered 2024-01-28: 1 [IU] via SUBCUTANEOUS
  Administered 2024-01-29: 3 [IU] via SUBCUTANEOUS
  Administered 2024-01-29: 1 [IU] via SUBCUTANEOUS
  Administered 2024-01-30: 2 [IU] via SUBCUTANEOUS
  Administered 2024-01-30: 5 [IU] via SUBCUTANEOUS
  Administered 2024-01-31 – 2024-02-01 (×2): 1 [IU] via SUBCUTANEOUS
  Administered 2024-02-01: 2 [IU] via SUBCUTANEOUS
  Administered 2024-02-02: 1 [IU] via SUBCUTANEOUS
  Administered 2024-02-02: 2 [IU] via SUBCUTANEOUS

## 2024-01-27 MED ORDER — IPRATROPIUM-ALBUTEROL 0.5-2.5 (3) MG/3ML IN SOLN
3.0000 mL | Freq: Four times a day (QID) | RESPIRATORY_TRACT | Status: DC | PRN
  Administered 2024-01-28 – 2024-01-31 (×4): 3 mL via RESPIRATORY_TRACT
  Filled 2024-01-27 (×4): qty 3

## 2024-01-27 MED ORDER — ONDANSETRON HCL 4 MG/2ML IJ SOLN: 4.0000 mg | Freq: Four times a day (QID) | INTRAMUSCULAR | Status: DC | PRN

## 2024-01-27 MED ORDER — INSULIN ASPART 100 UNIT/ML IJ SOLN: 0.0000 [IU] | Freq: Every day | INTRAMUSCULAR | Status: DC

## 2024-01-27 MED ORDER — ENSURE ENLIVE PO LIQD
237.0000 mL | Freq: Two times a day (BID) | ORAL | Status: DC
  Administered 2024-01-28 – 2024-01-30 (×4): 237 mL via ORAL
  Filled 2024-01-27 (×9): qty 237

## 2024-01-27 MED ORDER — SODIUM CHLORIDE 0.9 % IV SOLN
1.0000 g | Freq: Once | INTRAVENOUS | Status: AC
  Administered 2024-01-27: 1 g via INTRAVENOUS
  Filled 2024-01-27: qty 10

## 2024-01-27 MED ORDER — ONDANSETRON HCL 4 MG PO TABS: 4.0000 mg | ORAL_TABLET | Freq: Four times a day (QID) | ORAL | Status: DC | PRN

## 2024-01-27 NOTE — ED Notes (Signed)
 Pt does have bilateral skin tears on elbows from fall prior to arrival, skin tears dressed

## 2024-01-27 NOTE — Progress Notes (Signed)
 PT Cancellation Note  Patient Details Name: Guy Roberson MRN: [id number] DOB: [birth year]   Cancelled Treatment:    Reason Eval/Treat Not Completed: Medical issues which prohibited therapy  Granddaughter in room states pt lives alone but son lives next door and family has a Comptroller that comes in daily to stay with pt.  Pt is confused which is not normal, seeing people who are not there.  PT refuses at this time will attempt tomorrow  Montie Metro, PT CLT [phone]  01/27/2024, 2:38 PM

## 2024-01-27 NOTE — ED Notes (Signed)
 Patient transported to CT

## 2024-01-27 NOTE — ED Provider Notes (Signed)
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 including ammonia, TSH, CMP.  Differential also includes intracranial process, also given fall will obtain CT head.  Vital signs overall reassuring.  Will reassess.  Given worsening confusion and falls may need admission.  Clinical Course as of 01/27/24 1110  Thu Jan 27, 2024  1108 Workup with mild AKI, creatinine up to 1.9.  CT head negative.  Laboratory testing otherwise unrevealing.  Urinalysis does show some signs of UTI with WBCs and leukocytes.  No bacteria however patient has been on antibiotics.  Feel patient needs admission given AKI and encephalopathy.  Discussed with hospitalist who will admit patient. [WS]    Clinical Course User Index [WS] Francesca Elsie CROME, MD     Additional history obtained: -Additional history obtained from family and ems -External records from outside source obtained and reviewed including: Chart review including previous notes, labs, imaging, consultation notes  including prior notes    Lab Tests: -I ordered, reviewed, and interpreted labs.   The pertinent results include:   Labs Reviewed  COMPREHENSIVE METABOLIC PANEL WITH GFR - Abnormal; Notable for the following components:      Result Value   CO2 19 (*)    Glucose, Bld 140 (*)    BUN 37 (*)    Creatinine, Ser 1.93 (*)    GFR, Estimated 33 (*)    All other components within normal limits  CBC WITH DIFFERENTIAL/PLATELET - Abnormal; Notable for the following components:   RBC 3.85 (*)    Hemoglobin 12.1 (*)    HCT 36.0 (*)    Platelets 148 (*)    Neutro Abs 8.6 (*)    Abs Immature Granulocytes 0.08 (*)    All other components within normal limits  URINALYSIS, W/ REFLEX TO CULTURE (INFECTION SUSPECTED) - Abnormal; Notable for the following components:   APPearance HAZY (*)    Glucose, UA 150 (*)    Ketones, ur 5 (*)    Protein, ur 30 (*)    Leukocytes,Ua MODERATE (*)    All other components within normal limits  BRAIN NATRIURETIC PEPTIDE - Abnormal; Notable for the following components:   B Natriuretic Peptide 726.0 (*)    All other components within normal limits  URINE CULTURE  ETHANOL  AMMONIA  TSH  VITAMIN B12  VITAMIN D  25 HYDROXY (VIT D DEFICIENCY, FRACTURES)  CBC  CREATININE, SERUM  MAGNESIUM   PHOSPHORUS  HEMOGLOBIN A1C    Notable for Aki, possible UTI   EKG   EKG Interpretation Date/Time:  Thursday January 27 2024 08:37:37 EDT Ventricular Rate:  66 PR Interval:    QRS Duration:  192 QT Interval:  507 QTC Calculation: 532 R Axis:   -78  Text Interpretation: VENTRICULAR PACED RHYTHM Confirmed by Francesca Elsie ([id number]) on 01/27/2024 11:01:57 AM         Imaging Studies ordered: I ordered imaging studies including CXR, CT chest, CT head, CT C spine  On my interpretation imaging demonstrates lung nodule, thyroid nodule, discussed with granddaughter including need for follow up I independently visualized and interpreted imaging. I  agree with the  radiologist interpretation   Medicines ordered and prescription drug management: Meds ordered this encounter  Medications   cefTRIAXone  (ROCEPHIN ) 1 g in sodium chloride  0.9 % 100 mL IVPB    Antibiotic Indication::   UTI   lactated ringers  bolus 500 mL   0.9 %  sodium chloride  infusion   heparin  injection 5,000 Units   OR Linked Order Group    acetaminophen  (TYLENOL ) tablet 650 mg    acetaminophen  (TYLENOL ) suppository 650 mg   OR Linked Order Group    ondansetron  (ZOFRAN ) tablet 4 mg    ondansetron  (ZOFRAN ) injection 4 mg   cefTRIAXone  (ROCEPHIN ) 1 g in sodium chloride  0.9 % 100 mL IVPB    Antibiotic Indication::   UTI   insulin  aspart (novoLOG ) injection 0-9 Units    Correction coverage::   Sensitive (thin, NPO, renal)    CBG < 70::   Implement Hypoglycemia Standing Orders and refer to Hypoglycemia Standing Orders sidebar report    CBG 70 - 120::   0 units    CBG 121 - 150::   1 unit    CBG 151 - 200::   2 units    CBG 201 - 250::   3 units    CBG 251 - 300::   5 units    CBG 301 - 350::   7 units    CBG 351 - 400:   9 units    CBG > 400:   call MD and obtain STAT lab verification   insulin  aspart (novoLOG ) injection 0-5 Units    Correction coverage::   HS scale    CBG < 70::   Implement Hypoglycemia Standing Orders and refer to Hypoglycemia Standing Orders sidebar report    CBG 70 - 120::   0 units    CBG 121 - 150::   0 units    CBG 151 - 200::   0 units    CBG 201 - 250::   2 units    CBG 251 - 300::   3 units    CBG 301 - 350::   4 units    CBG 351 - 400::   5 units    CBG > 400:   call MD and obtain STAT lab verification   feeding supplement (ENSURE ENLIVE / ENSURE PLUS) liquid 237 mL   metoprolol  tartrate (LOPRESSOR ) tablet 50 mg   pantoprazole  (PROTONIX ) EC tablet 40 mg    TAKE ONE TABLET (20MG  TOTAL) BY MOUTH TWO TIMES DAILY BEFORE A MEAL     simvastatin  (ZOCOR ) tablet 20 mg   tamsulosin  (FLOMAX ) capsule 0.4 mg    -I have reviewed the patients home medicines  and have made adjustments as needed   Consultations Obtained: I requested consultation with the hospitalist,  and discussed lab and imaging findings as well as pertinent plan - they recommend: admission   Cardiac Monitoring: The patient was maintained on a cardiac monitor.  I personally viewed and interpreted the cardiac monitored which showed an underlying rhythm of: NSR  Reevaluation: After the interventions noted above, I reevaluated the patient and found that their symptoms have improved  Co morbidities that complicate the patient evaluation  Past Medical History:  Diagnosis Date   Arthritis    CAD (coronary artery disease)    a.  90% LAD stenosis in 1995 treated with PTCA;  b. 09/2009 CABG x 4: LIMA->LAD, VG->Diag, VG->OM, VG->PDA;  c. 11/2011 Cath: 3VD, 4/4 patent grafts, EF 70%.   Chronic constipation  Colonic polyp    Complete heart block (HCC)    a. 11/2011 s/p MDT Adapta L ADDRL 1 Ser # [id number] H.   Complication of anesthesia    PROBLEMS VOIDING AFTER PREVIOUS SURGERIES   Diabetes mellitus without complication (HCC)    NOT ON ANY DIABETIC MEDICATIONS   Fasting hyperglycemia    GERD (gastroesophageal reflux disease)    Hyperlipidemia    Hypertension    Pacemaker 11/2011   DR.  G.TAYLOR AND DR. DOROTHA RAKERS   Pseudophakia of both eyes    Seizures (HCC)    a. prior to diagnosis of heart block and syncope, this was in the differential and pt was briefly on Keppra , started by ER MD.   Syncope    a. 11/2011 18 second pauses noted on Event Monitor assoc w/ syncope   Trauma 1952   Abdominal and chest in 1952; multiple injuries including pelvic fracture, rib fractures and ruptured bladder   Urinary retention    PT HAS INDWELLING FOLEY CATHETER      Dispostion: Disposition decision including need for hospitalization was considered, and patient admitted to the hospital.    Final Clinical Impression(s) / ED Diagnoses Final diagnoses:  Acute metabolic encephalopathy   AKI (acute kidney injury) (HCC)  Urinary tract infection without hematuria, site unspecified     This chart was dictated using voice recognition software.  Despite best efforts to proofread,  errors can occur which can change the documentation meaning.    Francesca Elsie CROME, MD 01/27/24 1110

## 2024-01-27 NOTE — ED Triage Notes (Signed)
 Pt arrived via [facility] from home, was found outside on the ground, unsure if pt fell, pt was recently Dx with a UTI Friday and has been on ABX for that, pt does have AMS per family since UTI, pt does have a pacemaker, Hx of diabetes, HTN. Per EMS, pt is not on blood thinners two skins tears on bilateral arms. Pt is hard of hearing

## 2024-01-27 NOTE — H&P (Signed)
 History and Physical    Patient: Guy Roberson FMW:[id number] DOB: 1931-08-21 DOA: 01/27/2024 DOS: the patient was seen and examined on 01/27/2024 PCP: [name], MD   Patient coming from: Home  Chief Complaint:  Chief Complaint  Patient presents with   Fall   HPI: Guy Roberson is a 89 y.o. male with medical history significant of BPH, GERD, hypertension, hyperlipidemia, chronic kidney disease stage IIIa, history of complete heart block (status post pacemaker implantation) and history of coronary artery disease; who presented to the hospital secondary to confusion and mechanical fall.  Per family recent diagnosis for UTI by PCP, started on oral antibiotics.  Patient has been intermittently confused, forgetting things and mixing up information and statements.  No focal neurologic deficits reported. - There has not been any fever, chills, chest pain, shortness of breath, nausea, vomiting, hematuria, melena, hematochezia or any focal weakness.  Of note, there have been some associated anorexia.  Workup in the ED demonstrating negative CT head for acute intracranial normalities, no acute cervical fracture or findings suggesting the presence of pneumonia.  Urinalysis with positive leukocyte esterase but no nitrites; blood work with normal WBCs but with worsening creatinine demonstrating acute on chronic renal failure.  Patient received IV fluids, cultures taken and he was started on IV antibiotics.  TRH contacted to place patient in the hospital for further evaluation and management.   Review of Systems: As mentioned in the history of present illness. All other systems reviewed and are negative.  Past Medical History:  Diagnosis Date   Arthritis    CAD (coronary artery disease)    a.  90% LAD stenosis in 1995 treated with PTCA;  b. 09/2009 CABG x 4: LIMA->LAD, VG->Diag, VG->OM, VG->PDA;  c. 11/2011 Cath: 3VD, 4/4 patent grafts, EF 70%.   Chronic constipation    Colonic polyp    Complete  heart block (HCC)    a. 11/2011 s/p MDT Adapta L ADDRL 1 Ser # [id number] H.   Complication of anesthesia    PROBLEMS VOIDING AFTER PREVIOUS SURGERIES   Diabetes mellitus without complication (HCC)    NOT ON ANY DIABETIC MEDICATIONS   Fasting hyperglycemia    GERD (gastroesophageal reflux disease)    Hyperlipidemia    Hypertension    Pacemaker 11/2011   DR.  G.TAYLOR AND DR. DOROTHA RAKERS   Pseudophakia of both eyes    Seizures (HCC)    a. prior to diagnosis of heart block and syncope, this was in the differential and pt was briefly on Keppra , started by ER MD.   Syncope    a. 11/2011 18 second pauses noted on Event Monitor assoc w/ syncope   Trauma 1952   Abdominal and chest in 1952; multiple injuries including pelvic fracture, rib fractures and ruptured bladder   Urinary retention    PT HAS INDWELLING FOLEY CATHETER   Past Surgical History:  Procedure Laterality Date   BIOPSY  07/05/2019   Procedure: BIOPSY;  Surgeon: Golda Claudis PENNER, MD;  Location: [facility];  Service: Endoscopy;;  gastric   BLADDER SURGERY  1950'S   CATARACT EXTRACTION W/PHACO Right 11/22/2012   Procedure: CATARACT EXTRACTION PHACO AND INTRAOCULAR LENS PLACEMENT (IOC);  Surgeon: Oneil T. Roz, MD;  Location: [facility];  Service: Ophthalmology;  Laterality: Right;  CDE 10.27   CATARACT EXTRACTION W/PHACO Left 12/20/2012   Procedure: CATARACT EXTRACTION PHACO AND INTRAOCULAR LENS PLACEMENT (IOC);  Surgeon: Oneil T. Roz, MD;  Location: [facility];  Service: Ophthalmology;  Laterality:  Left;  CDE:6.67   COLONOSCOPY W/ POLYPECTOMY     CORONARY ARTERY BYPASS GRAFT  09/2009   4 vessels   ESOPHAGEAL DILATION  07/05/2019   Procedure: ESOPHAGEAL DILATION;  Surgeon: Golda Claudis PENNER, MD;  Location: [facility];  Service: Endoscopy;;   ESOPHAGOGASTRODUODENOSCOPY (EGD) WITH PROPOFOL  N/A 07/05/2019   Procedure: ESOPHAGOGASTRODUODENOSCOPY (EGD) WITH PROPOFOL ;  Surgeon: Golda Claudis PENNER, MD;  Location: [facility];  Service:  Endoscopy;  Laterality: N/A;  110-office notified pt new arrival time 10:45am   FEMORAL HERNIA REPAIR     INSERTION OF SUPRAPUBIC CATHETER  04/04/2012   Procedure: INSERTION OF SUPRAPUBIC CATHETER;  Surgeon: Oneil JAYSON Rafter, MD;  Location: [facility];  Service: Urology;  Laterality: N/A;   LEAD REVISION N/A 12/30/2011   Procedure: LEAD REVISION;  Surgeon: Danelle LELON [place], MD;  Location: [facility] CATH LAB;  Service: Cardiovascular;  Laterality: N/A;   LEFT HEART CATHETERIZATION WITH CORONARY/GRAFT ANGIOGRAM  12/29/2011   Procedure: LEFT HEART CATHETERIZATION WITH EL BILE;  Surgeon: Ozell Fell, MD;  Location: [facility] CATH LAB;  Service: Cardiovascular;;   PACEMAKER PLACEMENT     TEMPORARY PACEMAKER INSERTION N/A 12/29/2011   Procedure: TEMPORARY PACEMAKER INSERTION;  Surgeon: Ozell Fell, MD;  Location: [facility] CATH LAB;  Service: Cardiovascular;  Laterality: N/A;   TRANSURETHRAL PROSTATECTOMY WITH GYRUS INSTRUMENTS  04/04/2012   Procedure: TRANSURETHRAL PROSTATECTOMY WITH GYRUS INSTRUMENTS;  Surgeon: Mark C Ottelin, MD;  Location: [facility];  Service: Urology;  Laterality: N/A;  TURP with Gyrus and Suprapubic Tube Placement   YAG LASER APPLICATION Left 06/25/2015   Procedure: YAG LASER APPLICATION;  Surgeon: Oneil Platts, MD;  Location: [facility];  Service: Ophthalmology;  Laterality: Left;   Social History:  reports that he has never smoked. His smokeless tobacco use includes chew. He reports that he does not drink alcohol and does not use drugs.  Allergies  Allergen Reactions   Penicillins Other (See Comments)    Unknown reaction  Did it involve swelling of the face/tongue/throat, SOB, or low BP? Unknown Did it involve sudden or severe rash/hives, skin peeling, or any reaction on the inside of your mouth or nose? Unknown Did you need to seek medical attention at a hospital or doctor's office? Unknown When did it last happen?      childhood allergy If all above answers are "NO", may proceed with  cephalosporin use.     Family History  Problem Relation Age of Onset   Heart block Mother    Heart murmur Mother    Aneurysm Father    Coronary artery disease Brother        x3   Lung cancer Brother     Prior to Admission medications   Medication Sig Start Date End Date Taking? Authorizing Provider  acetaminophen  (TYLENOL ) 650 MG CR tablet Take 650 mg by mouth every 8 (eight) hours as needed for pain or fever. 02/02/20   [provider]  albuterol  (VENTOLIN  HFA) 108 (90 Base) MCG/ACT inhaler Inhale 2 puffs into the lungs every 6 (six) hours as needed for wheezing or shortness of breath. 07/28/23   [place] Danelle LELON, MD  CONSTULOSE  10 GM/15ML solution Take 30 mLs (20 g total) by mouth 2 (two) times daily as needed for moderate constipation. Patient not taking: Reported on 07/28/2023 08/21/22   Landy Barnie RAMAN, NP  feeding supplement (ENSURE ENLIVE / ENSURE PLUS) LIQD Take 237 mLs by mouth 2 (two) times daily between meals. Patient not taking: Reported on 01/18/2023 03/16/22  Shahmehdi, Adriana LABOR, MD  furosemide  (LASIX ) 40 MG tablet Take 1.5 tablets (60 mg total) by mouth daily. 06/11/23 09/09/23  Strader, Laymon HERO, PA-C  lisinopril  (ZESTRIL ) 10 MG tablet TAKE (1) TABLET BY MOUTH TWICE DAILY. 07/15/23   Landy Barnie RAMAN, NP  metoprolol  tartrate (LOPRESSOR ) 50 MG tablet TAKE (1) TABLET BY MOUTH TWICE DAILY. 07/15/23   Landy Barnie RAMAN, NP  pantoprazole  (PROTONIX ) 20 MG tablet TAKE ONE TABLET (20MG  TOTAL) BY MOUTH TWO TIMES DAILY BEFORE A MEAL Patient taking differently: Take 20 mg by mouth daily. TAKE ONE TABLET (20MG  TOTAL) BY MOUTH TWO TIMES DAILY BEFORE A MEAL 08/21/22   Landy Barnie RAMAN, NP  potassium chloride  SA (KLOR-CON  M) 20 MEQ tablet TAKE (1) TABLET BY MOUTH ONCE DAILY TO REPLACE POTASSIUM 07/15/23   Landy Barnie RAMAN, NP  simvastatin  (ZOCOR ) 20 MG tablet Take 1 tablet (20 mg total) by mouth daily. 08/21/22   Landy Barnie RAMAN, NP  SURE COMFORT PEN NEEDLES 31G X 5 MM MISC USE AS  DIRECTED UP TO 3ITIMES DAILY. 04/29/22   [provider]  tamsulosin  (FLOMAX ) 0.4 MG CAPS capsule Take 1 capsule (0.4 mg total) by mouth daily after supper. Patient taking differently: Take 0.4 mg by mouth 2 (two) times daily. 08/21/22   Landy Barnie RAMAN, NP    Physical Exam: Vitals:   01/27/24 0915 01/27/24 0945 01/27/24 1000 01/27/24 1045  BP: 138/68 130/76 138/80 123/84  Pulse: 74 76 81 84  Resp: 16 18 17    Temp:      TempSrc:      SpO2: 95% 95% 96% 95%  Weight:      Height:       General exam: Alert, awake, afebrile, able to follow simple commands; no acute distress. Respiratory system: Clear to auscultation. Respiratory effort normal.  Good saturation on room air. Cardiovascular system:RRR. No rubs or gallops; no JVD. Gastrointestinal system: Abdomen is nondistended, soft and nontender.  Positive bowel sounds. Central nervous system: Moving 4 limbs spontaneously.  No focal neurological deficits. Extremities: No cyanosis or clubbing; no edema appreciated on exam. Skin: No petechiae. Psychiatry: Mood & affect appropriate.   Data Reviewed: Comprehensive metabolic panel: Sodium 136, potassium 4.0, chloride 103, bicarb 19, BUN 37, creatinine 1.93, normal LFTs and GFR 33. CBC: WBCs 10.3, hemoglobin 12.1 and platelet count 148K BNP: 726 Ammonia: 18 Magnesium : 2.0  Assessment and Plan: 1-acute on chronic renal failure - Patient with chronic kidney disease stage IIIa at baseline - Most likely in the setting of prerenal azotemia, dehydration and UTI - Will provide fluid resuscitation and follow renal function trend - Continue IV antibiotics and follow culture - Minimize nephrotoxic agents, avoid hypotension and the use of contrast - Follow clinical response and urine output.  2-UTI - Follow culture result - IV Rocephin  has been started  3-metabolic encephalopathy - In the setting of dehydration and UTI most likely - Continue treatment for UTI as mentioned above -  Will provide fluid resuscitation - Continue constant rotation and minimize sedative agents. -In order to be thorough we will check TSH, B12 and vitamin D   4-hypertension - Continue the use of metoprolol  - Holding ARB and diuretics in the setting of acute kidney injury - Follow vital signs.  5-history of BPH - Continue Flomax   6-mechanical fall - Evaluation by physical therapy requested - Continue supportive care and follow clinical response.  7-hyperlipidemia - Continue Zocor   8-GERD - Continue PPI  9-history of complete heart block - Status post pacemaker  implantation; on recent evaluation demonstrating no abnormalities and adequate performed - Continue patient follow-up with cardiology service.     Advance Care Planning:   Code Status: Limited: Do not attempt resuscitation (DNR) -DNR-LIMITED -Do Not Intubate/DNI    Consults: None  Family Communication: No family at bedside.  Severity of Illness: The appropriate patient status for this patient is OBSERVATION. Observation status is judged to be reasonable and necessary in order to provide the required intensity of service to ensure the patient's safety. The patient's presenting symptoms, physical exam findings, and initial radiographic and laboratory data in the context of their medical condition is felt to place them at decreased risk for further clinical deterioration. Furthermore, it is anticipated that the patient will be medically stable for discharge from the hospital within 2 midnights of admission.   Author: Eric Nunnery, MD 01/27/2024 11:08 AM  For on call review [email or web address].

## 2024-01-27 NOTE — Plan of Care (Signed)

## 2024-01-28 ENCOUNTER — Encounter

## 2024-01-28 DIAGNOSIS — G9341 Metabolic encephalopathy: Secondary | ICD-10-CM | POA: Diagnosis not present

## 2024-01-28 DIAGNOSIS — I5032 Chronic diastolic (congestive) heart failure: Secondary | ICD-10-CM | POA: Diagnosis present

## 2024-01-28 DIAGNOSIS — N390 Urinary tract infection, site not specified: Secondary | ICD-10-CM

## 2024-01-28 DIAGNOSIS — Y92008 Other place in unspecified non-institutional (private) residence as the place of occurrence of the external cause: Secondary | ICD-10-CM | POA: Diagnosis not present

## 2024-01-28 DIAGNOSIS — E860 Dehydration: Secondary | ICD-10-CM | POA: Diagnosis present

## 2024-01-28 DIAGNOSIS — J42 Unspecified chronic bronchitis: Secondary | ICD-10-CM | POA: Diagnosis not present

## 2024-01-28 DIAGNOSIS — Z951 Presence of aortocoronary bypass graft: Secondary | ICD-10-CM | POA: Diagnosis not present

## 2024-01-28 DIAGNOSIS — W1830XA Fall on same level, unspecified, initial encounter: Secondary | ICD-10-CM | POA: Diagnosis present

## 2024-01-28 DIAGNOSIS — R0902 Hypoxemia: Secondary | ICD-10-CM | POA: Diagnosis not present

## 2024-01-28 DIAGNOSIS — I959 Hypotension, unspecified: Secondary | ICD-10-CM | POA: Diagnosis not present

## 2024-01-28 DIAGNOSIS — Z79899 Other long term (current) drug therapy: Secondary | ICD-10-CM | POA: Diagnosis not present

## 2024-01-28 DIAGNOSIS — Z8249 Family history of ischemic heart disease and other diseases of the circulatory system: Secondary | ICD-10-CM | POA: Diagnosis not present

## 2024-01-28 DIAGNOSIS — N1831 Chronic kidney disease, stage 3a: Secondary | ICD-10-CM

## 2024-01-28 DIAGNOSIS — N400 Enlarged prostate without lower urinary tract symptoms: Secondary | ICD-10-CM | POA: Diagnosis present

## 2024-01-28 DIAGNOSIS — K219 Gastro-esophageal reflux disease without esophagitis: Secondary | ICD-10-CM | POA: Diagnosis present

## 2024-01-28 DIAGNOSIS — N179 Acute kidney failure, unspecified: Secondary | ICD-10-CM | POA: Diagnosis not present

## 2024-01-28 DIAGNOSIS — J811 Chronic pulmonary edema: Secondary | ICD-10-CM | POA: Diagnosis not present

## 2024-01-28 DIAGNOSIS — N189 Chronic kidney disease, unspecified: Secondary | ICD-10-CM | POA: Diagnosis not present

## 2024-01-28 DIAGNOSIS — I2510 Atherosclerotic heart disease of native coronary artery without angina pectoris: Secondary | ICD-10-CM | POA: Diagnosis present

## 2024-01-28 DIAGNOSIS — F17220 Nicotine dependence, chewing tobacco, uncomplicated: Secondary | ICD-10-CM | POA: Diagnosis present

## 2024-01-28 DIAGNOSIS — Z801 Family history of malignant neoplasm of trachea, bronchus and lung: Secondary | ICD-10-CM | POA: Diagnosis not present

## 2024-01-28 DIAGNOSIS — N170 Acute kidney failure with tubular necrosis: Secondary | ICD-10-CM | POA: Diagnosis not present

## 2024-01-28 DIAGNOSIS — I700 Atherosclerosis of aorta: Secondary | ICD-10-CM | POA: Diagnosis not present

## 2024-01-28 DIAGNOSIS — Z950 Presence of cardiac pacemaker: Secondary | ICD-10-CM | POA: Diagnosis not present

## 2024-01-28 DIAGNOSIS — I130 Hypertensive heart and chronic kidney disease with heart failure and stage 1 through stage 4 chronic kidney disease, or unspecified chronic kidney disease: Secondary | ICD-10-CM | POA: Diagnosis present

## 2024-01-28 DIAGNOSIS — E785 Hyperlipidemia, unspecified: Secondary | ICD-10-CM | POA: Diagnosis present

## 2024-01-28 DIAGNOSIS — Z66 Do not resuscitate: Secondary | ICD-10-CM | POA: Diagnosis present

## 2024-01-28 DIAGNOSIS — Z880 Allergy status to penicillin: Secondary | ICD-10-CM | POA: Diagnosis not present

## 2024-01-28 DIAGNOSIS — E1122 Type 2 diabetes mellitus with diabetic chronic kidney disease: Secondary | ICD-10-CM | POA: Diagnosis present

## 2024-01-28 LAB — CBC
HCT: 32.4 % — ABNORMAL LOW (ref 39.0–52.0)
Hemoglobin: 10.7 g/dL — ABNORMAL LOW (ref 13.0–17.0)
MCH: 31.4 pg (ref 26.0–34.0)
MCHC: 33 g/dL (ref 30.0–36.0)
MCV: 95 fL (ref 80.0–100.0)
Platelets: 154 K/uL (ref 150–400)
RBC: 3.41 MIL/uL — ABNORMAL LOW (ref 4.22–5.81)
RDW: 14.6 % (ref 11.5–15.5)
WBC: 9.2 K/uL (ref 4.0–10.5)
nRBC: 0 % (ref 0.0–0.2)

## 2024-01-28 LAB — HEMOGLOBIN A1C
Hgb A1c MFr Bld: 6.7 % — ABNORMAL HIGH (ref 4.8–5.6)
Mean Plasma Glucose: 146 mg/dL

## 2024-01-28 LAB — BASIC METABOLIC PANEL WITH GFR
Anion gap: 5 (ref 5–15)
BUN: 39 mg/dL — ABNORMAL HIGH (ref 8–23)
CO2: 23 mmol/L (ref 22–32)
Calcium: 9.6 mg/dL (ref 8.9–10.3)
Chloride: 111 mmol/L (ref 98–111)
Creatinine, Ser: 2.08 mg/dL — ABNORMAL HIGH (ref 0.61–1.24)
GFR, Estimated: 30 mL/min — ABNORMAL LOW (ref >60)
Glucose, Bld: 85 mg/dL (ref 70–99)
Potassium: 4 mmol/L (ref 3.5–5.1)
Sodium: 139 mmol/L (ref 135–145)

## 2024-01-28 LAB — GLUCOSE, CAPILLARY
Glucose-Capillary: 81 mg/dL (ref 70–99)
Glucose-Capillary: 145 mg/dL — ABNORMAL HIGH (ref 70–99)
Glucose-Capillary: 233 mg/dL — ABNORMAL HIGH (ref 70–99)
Glucose-Capillary: 159 mg/dL — ABNORMAL HIGH (ref 70–99)

## 2024-01-28 MED ORDER — GUAIFENESIN-DM 100-10 MG/5ML PO SYRP
5.0000 mL | ORAL_SOLUTION | ORAL | Status: DC | PRN
  Administered 2024-01-28 – 2024-02-01 (×8): 5 mL via ORAL
  Filled 2024-01-28 (×8): qty 5
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
## 2024-01-28 NOTE — Care Management Obs Status (Signed)
 MEDICARE OBSERVATION STATUS NOTIFICATION   Patient Details  Name: Guy Roberson MRN: [id number] Date of Birth: March 22, 1932   Medicare Observation Status Notification Given:  Yes  Copy Given  Sharlyne Stabs, RN 01/28/2024, 1:57 PM

## 2024-01-28 NOTE — Care Management Obs Status (Signed)
 MEDICARE OBSERVATION STATUS NOTIFICATION   Patient Details  Name: Guy Roberson MRN: [id number] Date of Birth: 20-Jun-1931   Medicare Observation Status Notification Given:  Yes    Nena LITTIE Coffee, RN 01/28/2024, 12:52 PM

## 2024-01-28 NOTE — TOC Initial Note (Signed)
 Transition of Care [facility]) - Initial/Assessment Note    Patient Details  Name: Guy Roberson MRN: [id number] Date of Birth: 03-08-1932  Transition of [facility]) CM/SW Contact:    Sharlyne Stabs, RN Phone Number: 01/28/2024, 10:35 AM  Clinical Narrative:     Patient admitted acute on chronic renal failure. CM at bedside, patient is confused. Called Daughter in [place], she stated patient lives at home with his wife, who has dementia. They had a caregiver half a day every day that quit yesterday. She and their son live next door and will be staying with them until they can hire someone else. PT is recommending SNF. She is agreeable. He has on mitts, She states he get confused with these recurrent UTI. She states he has a walker, but mostly stays in his wheelchair to move around the house. . TOC completing FL2 to send out for bed offers. [facility] is their first choice. TOC following.                Barriers to Discharge: Continued Medical Work up  Patient Goals and CMS Choice   CMS Medicare.gov Compare Post Acute Care list provided to:: Patient Represenative (must comment) Choice offered to / list presented to : Adult Children  ownership interest in [facility].provided to:: Adult Children    Expected Discharge Plan and Services      Living arrangements for the past 2 months: Single Family Home                     Prior Living Arrangements/Services Living arrangements for the past 2 months: Single Family Home Lives with:: Spouse Patient language and need for interpreter reviewed:: Yes Do you feel safe going back to the place where you live?: Yes      Need for Family Participation in Patient Care: Yes (Comment) Care giver support system in place?: Yes (comment) Current home services: DME Criminal Activity/Legal Involvement Pertinent to Current Situation/Hospitalization: No - Comment as needed  Activities of Daily Living   ADL Screening (condition at time of  admission) Independently performs ADLs?: No Does the patient have a NEW difficulty with bathing/dressing/toileting/self-feeding that is expected to last >3 days?: No Does the patient have a NEW difficulty with getting in/out of bed, walking, or climbing stairs that is expected to last >3 days?: No Does the patient have a NEW difficulty with communication that is expected to last >3 days?: No Is the patient deaf or have difficulty hearing?: No Does the patient have difficulty seeing, even when wearing glasses/contacts?: No Does the patient have difficulty concentrating, remembering, or making decisions?: No  Permission Sought/Granted            Permission granted to share info w Relationship: Son / Daughter in Social worker     Emotional Assessment     Affect (typically observed): Other (comment) (confused) Orientation: : Oriented to Self Alcohol / Substance Use: Not Applicable Psych Involvement: No (comment)  Admission diagnosis:  Acute on chronic renal failure (HCC) [N17.9, N18.9] AKI (acute kidney injury) (HCC) [N17.9] Urinary tract infection without hematuria, site unspecified [N39.0] Acute metabolic encephalopathy [G93.41] Patient Active Problem List   Diagnosis Date Noted   Acute on chronic renal failure (HCC) 01/27/2024   Aorto-iliac atherosclerosis (HCC) 08/18/2022   Benign hypertension with coincident congestive heart failure (HCC) 08/18/2022   Hyperlipidemia associated with type 2 diabetes mellitus (HCC) 08/18/2022   OAB (overactive bladder) 08/18/2022   Mild protein-calorie malnutrition (HCC) 08/18/2022  Chronic constipation 08/18/2022   Neurocognitive deficits 08/18/2022   Acute on chronic diastolic CHF (congestive heart failure) (HCC) 08/12/2022   Fall at home, initial encounter 08/12/2022   Arm bruise, left, initial encounter 08/12/2022   AKI (acute kidney injury) (HCC) 08/12/2022   GERD (gastroesophageal reflux disease) 08/12/2022   Generalized weakness 03/04/2022    COVID-19 virus infection 03/04/2022   Acute congestive heart failure (HCC) 03/04/2022   Hypokalemia 03/04/2022   Gross hematuria 07/07/2021   Benign prostatic hyperplasia with urinary retention 07/07/2021   Bladder diverticulum 07/07/2021   Neurogenic bladder 07/07/2021   Urinary retention 01/01/2012    Class: Present on Admission   Pacemaker-[employer] 12/30/2011   Acute respiratory failure with hypoxia (HCC) 12/29/2011   Rib fracture 12/29/2011   Hypoxemia 12/29/2011   Syncope    Complete heart block (HCC)    Mixed hyperlipidemia    CAD (coronary artery disease)    Arteriosclerotic cardiovascular disease (ASCVD)    Fasting hyperglycemia    Colonic polyp    Type 2 diabetes mellitus with other circulatory complications (HCC) 10/02/2009   HYPERLIPIDEMIA 09/30/2009   Essential hypertension 09/30/2009   PCP:  [name], MD Pharmacy:   [facility] [place], [place] - [address] [place] [address] Phone: [phone] Fax: [phone]     Social Drivers of Health (SDOH) Social History: SDOH Screenings   Food Insecurity: Patient Unable To Answer (01/27/2024)  Housing: Unknown (01/27/2024)  Transportation Needs: Patient Unable To Answer (01/27/2024)  Utilities: Patient Unable To Answer (01/27/2024)  Depression (PHQ2-9): Low Risk  (08/17/2022)  Social Connections: Patient Unable To Answer (01/27/2024)  Tobacco Use: High Risk (01/27/2024)   SDOH Interventions:     Readmission Risk Interventions    08/12/2022    1:48 PM 03/06/2022    2:29 PM  Readmission Risk Prevention Plan  Transportation Screening Complete Complete  Home Care Screening  Complete  Medication Review (RN CM)  Complete  HRI or Home Care Consult Complete   Social Work Consult for Recovery Care Planning/Counseling Complete   Palliative Care Screening Not Applicable   Medication Review Oceanographer) Complete

## 2024-01-28 NOTE — Plan of Care (Signed)
  Problem: Acute Rehab PT Goals(only PT should resolve) Goal: Pt Will Go Supine/Side To Sit 01/28/2024 1133 by Dian Laprade, Student-PT Outcome: Progressing Flowsheets (Taken 01/28/2024 1133) Pt will go Supine/Side to Sit: with moderate assist 01/28/2024 1133 by Kenley Rettinger, Student-PT Outcome: Progressing Goal: Patient Will Transfer Sit To/From Stand 01/28/2024 1133 by Yue Glasheen, Student-PT Outcome: Progressing Flowsheets (Taken 01/28/2024 1133) Patient will transfer sit to/from stand: with minimal assist 01/28/2024 1133 by Mylin Hirano, Student-PT Outcome: Progressing Goal: Pt Will Transfer Bed To Chair/Chair To Bed 01/28/2024 1133 by Ariyon Mittleman, Student-PT Outcome: Progressing Flowsheets (Taken 01/28/2024 1133) Pt will Transfer Bed to Chair/Chair to Bed: with min assist 01/28/2024 1133 by Belmira Daley, Student-PT Outcome: Progressing Goal: Pt Will Ambulate 01/28/2024 1133 by Morris Lana, Student-PT Outcome: Progressing Flowsheets (Taken 01/28/2024 1133) Pt will Ambulate:  with minimal assist  25 feet  with rolling walker  15 feet 01/28/2024 1133 by Morris Lana, Student-PT Outcome: Progressing

## 2024-01-28 NOTE — Progress Notes (Signed)
 Progress Note   Patient: Guy Roberson FMW:[id number] DOB: [birth year] DOA: 01/27/2024     0 DOS: the patient was seen and examined on 01/28/2024   Brief hospital admission narrative course: Guy Roberson is a 89 y.o. male with medical history significant of BPH, GERD, hypertension, hyperlipidemia, chronic kidney disease stage IIIa, history of complete heart block (status post pacemaker implantation) and history of coronary artery disease; who presented to the hospital secondary to confusion and mechanical fall.  Per family recent diagnosis for UTI by PCP, started on oral antibiotics.  Patient has been intermittently confused, forgetting things and mixing up information and statements.  No focal neurologic deficits reported. - There has not been any fever, chills, chest pain, shortness of breath, nausea, vomiting, hematuria, melena, hematochezia or any focal weakness.   Of note, there have been some associated anorexia.   Workup in the ED demonstrating negative CT head for acute intracranial normalities, no acute cervical fracture or findings suggesting the presence of pneumonia.  Urinalysis with positive leukocyte esterase but no nitrites; blood work with normal WBCs but with worsening creatinine demonstrating acute on chronic renal failure.   Patient received IV fluids, cultures taken and he was started on IV antibiotics.  TRH contacted to place patient in the hospital for further evaluation and management.  Assessment and plan 1-acute on chronic renal failure/UTI - Patient with chronic kidney disease stage III at baseline - Prerenal azotemia, dehydration and UTI as a cause for acute kidney injury - Continue to maintain adequate hydration - Continue IV antibiotics and follow culture result - Minimize nephrotoxic agents, hypotension and contrast - Follow renal function trend.  2-history of BPH - Continue Flomax .  3-hypertension - Continue the use of metoprolol  - Continue holding ARB and  diuretics in the setting of acute kidney injury - Follow-up vital signs.  4-metabolic encephalopathy - In the setting of dehydration and UTI most likely - Continue constant reorientation and supportive care - Continue treatment for UTI - Maintain adequate hydration - Follow clinical response. -Overall improving but not completely back to baseline.  5-GERD - Continue PPI.  6-hyperlipidemia - Continue Zocor .  7-history of complete heart block - Status post pacemaker implantation - Continue patient follow-up with cardiology service.  8-mechanical fall/physical deconditioning - Patient has been seen by PT with recommendation for short-term placement into skilled nursing facility at discharge pursued rehabilitation and conditioning.  9-type 2 diabetes with nephropathy - A1c 6.7 - Continue sliding scale insulin  and follow CBG fluctuation.   Subjective:  Oriented x 2; following commands appropriately.  No chest pain, no nausea, no vomiting.  Good saturation on room air.  Physical Exam: Vitals:   01/27/24 2107 01/28/24 0433 01/28/24 1301 01/28/24 1513  BP: (!) 122/52 (!) 113/93 (!) 124/54   Pulse: 70 62 61   Resp: 19 17 20    Temp: 98.5 F (36.9 C) 98.6 F (37 C) 98.3 F (36.8 C)   TempSrc: Oral Oral    SpO2: 92% 95% 99% 98%  Weight:      Height:       General exam: Afebrile, no chest pain, no nausea, no vomiting. Respiratory system: Good saturation on room air. Cardiovascular system:RRR. No murmurs, rubs, gallops. Gastrointestinal system: Abdomen is nondistended, soft and nontender. No organomegaly or masses felt. Normal bowel sounds heard. Central nervous system: Moving 4 limbs spontaneously.  Generally weak.  No focal neurological deficits. Extremities: No cyanosis or clubbing. Skin: No petechiae. Psychiatry: Flat affect appreciated on exam.  Data Reviewed: Basic metabolic panel: Sodium 139, potassium 4.0, chloride 111, bicarb 23, BUN 39, creatinine 2.08 and GFR  30 CBC: WBCs 9.2, hemoglobin 10.7 and platelet count 154K B12: 412 Hemoglobin A1c 6.7 Vitamin D : 55.7 TSH: 0.829    Family Communication: No family at bedside.  Disposition: Status is: Inpatient Remains inpatient appropriate because: Continue IV antibiotics.  Skilled nursing facility once medically stable more than 2 pursued rehabilitation and conditioning.    Time spent: 50 minutes  Author: Eric Nunnery, MD 01/28/2024 3:43 PM  For on call review [email or web address].

## 2024-01-28 NOTE — [place] FL2 (Signed)
 [place]  MEDICAID FL2 LEVEL OF CARE FORM     IDENTIFICATION  Patient Name: Guy Roberson Birthdate: Feb 22, 1932 Sex: male Admission Date (Current Location): 01/27/2024  [facility] and [place] Number:  [employer] and Address:  [facility],  [address] [place] [address]      Provider Number: [id number]  Attending Physician Name and Address:  Ricky Fines, MD  Relative Name and Phone Number:  [name], [name] (Other)  [phone]    Current Level of Care: Hospital Recommended Level of Care: Skilled Nursing Facility Prior Approval Number:    Date Approved/Denied:   PASRR Number: [id number]  Discharge Plan: SNF    Current Diagnoses: Patient Active Problem List   Diagnosis Date Noted   Acute on chronic renal failure (HCC) 01/27/2024   Aorto-iliac atherosclerosis (HCC) 08/18/2022   Benign hypertension with coincident congestive heart failure (HCC) 08/18/2022   Hyperlipidemia associated with type 2 diabetes mellitus (HCC) 08/18/2022   OAB (overactive bladder) 08/18/2022   Mild protein-calorie malnutrition (HCC) 08/18/2022   Chronic constipation 08/18/2022   Neurocognitive deficits 08/18/2022   Acute on chronic diastolic CHF (congestive heart failure) (HCC) 08/12/2022   Fall at home, initial encounter 08/12/2022   Arm bruise, left, initial encounter 08/12/2022   AKI (acute kidney injury) (HCC) 08/12/2022   GERD (gastroesophageal reflux disease) 08/12/2022   Generalized weakness 03/04/2022   COVID-19 virus infection 03/04/2022   Acute congestive heart failure (HCC) 03/04/2022   Hypokalemia 03/04/2022   Gross hematuria 07/07/2021   Benign prostatic hyperplasia with urinary retention 07/07/2021   Bladder diverticulum 07/07/2021   Neurogenic bladder 07/07/2021   Urinary retention 01/01/2012   Pacemaker-[employer] 12/30/2011   Acute respiratory failure with hypoxia (HCC) 12/29/2011   Rib fracture 12/29/2011   Hypoxemia 12/29/2011   Syncope     Complete heart block (HCC)    Mixed hyperlipidemia    CAD (coronary artery disease)    Arteriosclerotic cardiovascular disease (ASCVD)    Fasting hyperglycemia    Colonic polyp    Type 2 diabetes mellitus with other circulatory complications (HCC) 10/02/2009   HYPERLIPIDEMIA 09/30/2009   Essential hypertension 09/30/2009    Orientation RESPIRATION BLADDER Height & Weight     Self  Normal External catheter Weight: 74.4 kg Height:  5' 8 (172.7 cm)  BEHAVIORAL SYMPTOMS/MOOD NEUROLOGICAL BOWEL NUTRITION STATUS      Continent Diet  AMBULATORY STATUS COMMUNICATION OF NEEDS Skin   Extensive Assist Verbally Bruising                       Personal Care Assistance Level of Assistance  Bathing, Feeding, Dressing Bathing Assistance: Maximum assistance Feeding assistance: Limited assistance Dressing Assistance: Maximum assistance     Functional Limitations Info  Sight, Hearing, Speech Sight Info: Impaired Hearing Info: Impaired Speech Info: Adequate    SPECIAL CARE FACTORS FREQUENCY  PT (By licensed PT)     PT Frequency: 5 times a week              Contractures Contractures Info: Not present    Additional Factors Info  Allergies, Code Status Code Status Info: DNR - Limited Allergies Info: Penicillins           Current Medications (01/28/2024):  This is the current hospital active medication list Current Facility-Administered Medications  Medication Dose Route Frequency Provider Last Rate Last Admin   acetaminophen  (TYLENOL ) tablet 650 mg  650 mg Oral Q6H PRN Ricky Fines, MD   650 mg at 01/27/24 2032  Or   acetaminophen  (TYLENOL ) suppository 650 mg  650 mg Rectal Q6H PRN Ricky Fines, MD       cefTRIAXone  (ROCEPHIN ) 1 g in sodium chloride  0.9 % 100 mL IVPB  1 g Intravenous Q24H Ricky Fines, MD 200 mL/hr at 01/28/24 0845 1 g at 01/28/24 0845   feeding supplement (ENSURE ENLIVE / ENSURE PLUS) liquid 237 mL  237 mL Oral BID BM Ricky Fines, MD   237 mL  at 01/28/24 0841   heparin  injection 5,000 Units  5,000 Units Subcutaneous Q8H Ricky Fines, MD   5,000 Units at 01/28/24 9482   insulin  aspart (novoLOG ) injection 0-5 Units  0-5 Units Subcutaneous QHS Ricky Fines, MD       insulin  aspart (novoLOG ) injection 0-9 Units  0-9 Units Subcutaneous TID WC Ricky Fines, MD   2 Units at 01/27/24 1334   ipratropium-albuterol  (DUONEB) 0.5-2.5 (3) MG/3ML nebulizer solution 3 mL  3 mL Nebulization Q6H PRN Ricky Fines, MD       metoprolol  tartrate (LOPRESSOR ) tablet 50 mg  50 mg Oral BID Ricky Fines, MD   50 mg at 01/28/24 9162   ondansetron  (ZOFRAN ) tablet 4 mg  4 mg Oral Q6H PRN Ricky Fines, MD       Or   ondansetron  (ZOFRAN ) injection 4 mg  4 mg Intravenous Q6H PRN Ricky Fines, MD       pantoprazole  (PROTONIX ) EC tablet 40 mg  40 mg Oral Daily Ricky Fines, MD   40 mg at 01/28/24 9162   simvastatin  (ZOCOR ) tablet 20 mg  20 mg Oral Daily Ricky Fines, MD   20 mg at 01/28/24 9162   tamsulosin  (FLOMAX ) capsule 0.4 mg  0.4 mg Oral QPC supper Ricky Fines, MD   0.4 mg at 01/27/24 1716     Discharge Medications: Please see discharge summary for a list of discharge medications.  Relevant Imaging Results:  Relevant Lab Results:   Additional Information SS# [id number]  Sharlyne Stabs, RN

## 2024-01-28 NOTE — Plan of Care (Signed)
  Problem: Education: Goal: Ability to describe self-care measures that may prevent or decrease complications (Diabetes Survival Skills Education) will improve Outcome: Not Progressing   Problem: Coping: Goal: Ability to adjust to condition or change in health will improve Outcome: Not Progressing   Problem: Fluid Volume: Goal: Ability to maintain a balanced intake and output will improve Outcome: Progressing   Problem: Health Behavior/Discharge Planning: Goal: Ability to identify and utilize available resources and services will improve Outcome: Not Progressing Goal: Ability to manage health-related needs will improve Outcome: Not Progressing   Problem: Metabolic: Goal: Ability to maintain appropriate glucose levels will improve Outcome: Progressing   Problem: Nutritional: Goal: Maintenance of adequate nutrition will improve Outcome: Progressing Goal: Progress toward achieving an optimal weight will improve Outcome: Progressing   Problem: Skin Integrity: Goal: Risk for impaired skin integrity will decrease Outcome: Progressing   Problem: Tissue Perfusion: Goal: Adequacy of tissue perfusion will improve Outcome: Progressing   Problem: Education: Goal: Knowledge of General Education information will improve Description: Including pain rating scale, medication(s)/side effects and non-pharmacologic comfort measures Outcome: Progressing   Problem: Health Behavior/Discharge Planning: Goal: Ability to manage health-related needs will improve Outcome: Not Progressing   Problem: Clinical Measurements: Goal: Ability to maintain clinical measurements within normal limits will improve Outcome: Progressing Goal: Will remain free from infection Outcome: Progressing Goal: Diagnostic test results will improve Outcome: Progressing Goal: Respiratory complications will improve Outcome: Progressing Goal: Cardiovascular complication will be avoided Outcome: Progressing   Problem:  Activity: Goal: Risk for activity intolerance will decrease Outcome: Not Progressing   Problem: Nutrition: Goal: Adequate nutrition will be maintained Outcome: Progressing   Problem: Coping: Goal: Level of anxiety will decrease Outcome: Progressing   Problem: Elimination: Goal: Will not experience complications related to bowel motility Outcome: Progressing Goal: Will not experience complications related to urinary retention Outcome: Progressing   Problem: Safety: Goal: Ability to remain free from injury will improve Outcome: Progressing   Problem: Skin Integrity: Goal: Risk for impaired skin integrity will decrease Outcome: Progressing

## 2024-01-29 ENCOUNTER — Inpatient Hospital Stay (HOSPITAL_COMMUNITY)

## 2024-01-29 DIAGNOSIS — G9341 Metabolic encephalopathy: Secondary | ICD-10-CM | POA: Diagnosis not present

## 2024-01-29 DIAGNOSIS — R0902 Hypoxemia: Secondary | ICD-10-CM | POA: Diagnosis not present

## 2024-01-29 DIAGNOSIS — N179 Acute kidney failure, unspecified: Secondary | ICD-10-CM | POA: Diagnosis not present

## 2024-01-29 DIAGNOSIS — J811 Chronic pulmonary edema: Secondary | ICD-10-CM | POA: Diagnosis not present

## 2024-01-29 DIAGNOSIS — N1831 Chronic kidney disease, stage 3a: Secondary | ICD-10-CM | POA: Diagnosis not present

## 2024-01-29 DIAGNOSIS — Z951 Presence of aortocoronary bypass graft: Secondary | ICD-10-CM | POA: Diagnosis not present

## 2024-01-29 DIAGNOSIS — N390 Urinary tract infection, site not specified: Secondary | ICD-10-CM | POA: Diagnosis not present

## 2024-01-29 DIAGNOSIS — I700 Atherosclerosis of aorta: Secondary | ICD-10-CM | POA: Diagnosis not present

## 2024-01-29 LAB — BASIC METABOLIC PANEL WITH GFR
Anion gap: 7 (ref 5–15)
BUN: 41 mg/dL — ABNORMAL HIGH (ref 8–23)
CO2: 23 mmol/L (ref 22–32)
Calcium: 9.6 mg/dL (ref 8.9–10.3)
Chloride: 108 mmol/L (ref 98–111)
Creatinine, Ser: 1.77 mg/dL — ABNORMAL HIGH (ref 0.61–1.24)
GFR, Estimated: 36 mL/min — ABNORMAL LOW (ref >60)
Glucose, Bld: 131 mg/dL — ABNORMAL HIGH (ref 70–99)
Potassium: 4.1 mmol/L (ref 3.5–5.1)
Sodium: 138 mmol/L (ref 135–145)

## 2024-01-29 LAB — GLUCOSE, CAPILLARY
Glucose-Capillary: 126 mg/dL — ABNORMAL HIGH (ref 70–99)
Glucose-Capillary: 215 mg/dL — ABNORMAL HIGH (ref 70–99)
Glucose-Capillary: 150 mg/dL — ABNORMAL HIGH (ref 70–99)
Glucose-Capillary: 162 mg/dL — ABNORMAL HIGH (ref 70–99)

## 2024-01-29 LAB — URINE CULTURE: Culture: 20000 — AB

## 2024-01-29 MED ORDER — FUROSEMIDE 20 MG PO TABS
20.0000 mg | ORAL_TABLET | Freq: Two times a day (BID) | ORAL | Status: DC
Start: 2024-01-29 — End: 2024-02-02
  Administered 2024-01-29 – 2024-02-02 (×8): 20 mg via ORAL
  Filled 2024-01-29 (×8): qty 1

## 2024-01-29 NOTE — Progress Notes (Signed)
 Progress Note   Patient: Guy Roberson FMW:[id number] DOB: 04-06-1932 DOA: 01/27/2024     1 DOS: the patient was seen and examined on 01/29/2024   Brief hospital admission narrative course: Guy Roberson is a 89 y.o. male with medical history significant of BPH, GERD, hypertension, hyperlipidemia, chronic kidney disease stage IIIa, history of complete heart block (status post pacemaker implantation) and history of coronary artery disease; who presented to the hospital secondary to confusion and mechanical fall.  Per family recent diagnosis for UTI by PCP, started on oral antibiotics.  Patient has been intermittently confused, forgetting things and mixing up information and statements.  No focal neurologic deficits reported. - There has not been any fever, chills, chest pain, shortness of breath, nausea, vomiting, hematuria, melena, hematochezia or any focal weakness.   Of note, there have been some associated anorexia.   Workup in the ED demonstrating negative CT head for acute intracranial normalities, no acute cervical fracture or findings suggesting the presence of pneumonia.  Urinalysis with positive leukocyte esterase but no nitrites; blood work with normal WBCs but with worsening creatinine demonstrating acute on chronic renal failure.   Patient received IV fluids, cultures taken and he was started on IV antibiotics.  TRH contacted to place patient in the hospital for further evaluation and management.  Assessment and plan 1-acute on chronic renal failure/UTI - Patient with chronic kidney disease stage III at baseline - Prerenal azotemia, dehydration and UTI as a cause for acute kidney injury - Continue to maintain adequate hydration; now orally - Continue IV antibiotics and follow culture results - Continue to minimize nephrotoxic agents, hypotension and contrast - Follow renal function trend.  2-history of BPH - Continue Flomax .  3-hypertension - Continue the use of metoprolol  -  Continue holding ARB in the setting of acute kidney injury and soft blood pressure. - Follow-up vital signs. - Resuming home diuretic therapy (dose adjusted).  4-metabolic encephalopathy - In the setting of dehydration and UTI most likely - Continue constant reorientation and supportive care - Continue treatment for UTI - Maintain adequate hydration - Follow clinical response. -Overall improving but not completely back to baseline.  5-GERD - Continue PPI.  6-hyperlipidemia - Continue Zocor .  7-history of complete heart block - Status post pacemaker implantation - Continue patient follow-up with cardiology service.  8-mechanical fall/physical deconditioning - Patient has been seen by PT with recommendation for short-term placement into skilled nursing facility at discharge pursued rehabilitation and conditioning.  9-type 2 diabetes with nephropathy - A1c 6.7 - Continue sliding scale insulin  and follow CBG fluctuation.  10-mild transient hypoxia - 2 L nasal cannula supplementation provided - Chest x-ray demonstrating some vascular congestion without interstitial edema and chronic emphysematous changes - Continue as needed bronchodilator management - Resuming home diuretic therapy.   Subjective:  Oriented x 3 and following commands appropriately; reporting generalized weakness and deconditioning.  No chest pain, no nausea, no vomiting.  Positive mild hypothermia and chills reported.  Physical Exam: Vitals:   01/29/24 0318 01/29/24 1223 01/29/24 1300 01/29/24 1410  BP: (!) 95/41 (!) 150/64    Pulse: (!) 58 68    Resp: 16 18    Temp: 97.7 F (36.5 C)  (!) 95.8 F (35.4 C) (!) 96.4 F (35.8 C)  TempSrc: Oral  Rectal Rectal  SpO2: 94% 100%    Weight:      Height:       General exam: Alert, awake, oriented x 3; following commands appropriately and in no  acute distress.  Reporting having chills. Respiratory system: No frank crackles; positive scattered rhonchi  appreciated on exam.  No using accessory muscle.  2 L supplementation in place with excellent saturation. Cardiovascular system: No rubs or gallops; no JVD.  Rate controlled. Gastrointestinal system: Abdomen is nondistended, soft and nontender.  Positive bowel sounds. Central nervous system: Generally weak.  No focal neurological deficits. Extremities: No cyanosis or clubbing; trace edema appreciated bilaterally. Skin: No petechiae. Psychiatry: Flat affect appreciated on exam.   Latest data Reviewed: Basic metabolic panel: Sodium 138, potassium 4.1, chloride 108, bicarb 23, BUN 41, creatinine 1.7 and GFR 36 CBC: WBCs 9.2, hemoglobin 10.7 and platelet count 154K B12: 412 Hemoglobin A1c 6.7 Vitamin D : 55.7 TSH: 0.829    Family Communication: No family at bedside.  Disposition: Status is: Inpatient Remains inpatient appropriate because: Continue IV antibiotics.  Skilled nursing facility once medically stable more than 2 pursued rehabilitation and conditioning.  Time spent: 50 minutes  Author: Eric Nunnery, MD 01/29/2024 3:06 PM  For on call review [email or web address].

## 2024-01-29 NOTE — Progress Notes (Signed)
 Notified by RN of desaturation, requiring 2L o2. Also later with borderline hypotension, repeat BP spontaneously recovered to 136/59. He feels comfortable, denies SOB despite desaturation, asymptomatic. On exam he does have rales ~ 1/2 the R posterior fields and in the L base. There is also 1+ edema starting just above the ankle. For now will obtain CXR to eval hypoxia. Hold his metoprolol  with borderline pressures.   Dorn Dawson, MD  [facility]

## 2024-01-29 NOTE — Plan of Care (Signed)
  Problem: Coping: Goal: Ability to adjust to condition or change in health will improve Outcome: Progressing   Problem: Tissue Perfusion: Goal: Adequacy of tissue perfusion will improve Outcome: Progressing   Problem: Clinical Measurements: Goal: Ability to maintain clinical measurements within normal limits will improve Outcome: Progressing

## 2024-01-29 NOTE — Progress Notes (Signed)
 RN called earlier and stated that patient was having some trouble breathing and she could hear patient wheezing.  I went up to give patient PRN treatment.  Upon auscultation, patient was clear/diminished, but seemed to have a slight upper airway wheeze.  RN had put patient on 2L [place] due to sats in 80s.  Treatment was given to patient despite the fact he was clear/diminished.  Patient tolerated treatment well and was placed back on 2L after treatment.

## 2024-01-29 NOTE — Plan of Care (Signed)
  Problem: Fluid Volume: Goal: Ability to maintain a balanced intake and output will improve Outcome: Progressing   Problem: Nutritional: Goal: Maintenance of adequate nutrition will improve Outcome: Progressing   Problem: Skin Integrity: Goal: Risk for impaired skin integrity will decrease Outcome: Progressing

## 2024-01-30 DIAGNOSIS — N1831 Chronic kidney disease, stage 3a: Secondary | ICD-10-CM | POA: Diagnosis not present

## 2024-01-30 DIAGNOSIS — N179 Acute kidney failure, unspecified: Secondary | ICD-10-CM | POA: Diagnosis not present

## 2024-01-30 DIAGNOSIS — N390 Urinary tract infection, site not specified: Secondary | ICD-10-CM | POA: Diagnosis not present

## 2024-01-30 DIAGNOSIS — G9341 Metabolic encephalopathy: Secondary | ICD-10-CM | POA: Diagnosis not present

## 2024-01-30 LAB — GLUCOSE, CAPILLARY
Glucose-Capillary: 107 mg/dL — ABNORMAL HIGH (ref 70–99)
Glucose-Capillary: 165 mg/dL — ABNORMAL HIGH (ref 70–99)
Glucose-Capillary: 256 mg/dL — ABNORMAL HIGH (ref 70–99)
Glucose-Capillary: 175 mg/dL — ABNORMAL HIGH (ref 70–99)

## 2024-01-30 LAB — BASIC METABOLIC PANEL WITH GFR
Anion gap: 7 (ref 5–15)
BUN: 36 mg/dL — ABNORMAL HIGH (ref 8–23)
CO2: 23 mmol/L (ref 22–32)
Calcium: 9.4 mg/dL (ref 8.9–10.3)
Chloride: 105 mmol/L (ref 98–111)
Creatinine, Ser: 1.61 mg/dL — ABNORMAL HIGH (ref 0.61–1.24)
GFR, Estimated: 41 mL/min — ABNORMAL LOW (ref >60)
Glucose, Bld: 99 mg/dL (ref 70–99)
Potassium: 4.4 mmol/L (ref 3.5–5.1)
Sodium: 135 mmol/L (ref 135–145)

## 2024-01-30 MED ORDER — PHENAZOPYRIDINE HCL 100 MG PO TABS
100.0000 mg | ORAL_TABLET | Freq: Two times a day (BID) | ORAL | Status: DC
  Administered 2024-01-30 – 2024-02-02 (×7): 100 mg via ORAL
  Filled 2024-01-30 (×7): qty 1

## 2024-01-30 MED ORDER — GLUCERNA SHAKE PO LIQD
237.0000 mL | Freq: Two times a day (BID) | ORAL | Status: DC
  Administered 2024-01-31 – 2024-02-02 (×4): 237 mL via ORAL

## 2024-01-30 NOTE — Progress Notes (Signed)
 Progress Note   Patient: Guy Roberson FMW:[id number] DOB: [birth year] DOA: 01/27/2024     2 DOS: the patient was seen and examined on 01/30/2024   Brief hospital admission narrative course: Guy Roberson is a 89 y.o. male with medical history significant of BPH, GERD, hypertension, hyperlipidemia, chronic kidney disease stage IIIa, history of complete heart block (status post pacemaker implantation) and history of coronary artery disease; who presented to the hospital secondary to confusion and mechanical fall.  Per family recent diagnosis for UTI by PCP, started on oral antibiotics.  Patient has been intermittently confused, forgetting things and mixing up information and statements.  No focal neurologic deficits reported. - There has not been any fever, chills, chest pain, shortness of breath, nausea, vomiting, hematuria, melena, hematochezia or any focal weakness.   Of note, there have been some associated anorexia.   Workup in the ED demonstrating negative CT head for acute intracranial normalities, no acute cervical fracture or findings suggesting the presence of pneumonia.  Urinalysis with positive leukocyte esterase but no nitrites; blood work with normal WBCs but with worsening creatinine demonstrating acute on chronic renal failure.   Patient received IV fluids, cultures taken and he was started on IV antibiotics.  TRH contacted to place patient in the hospital for further evaluation and management.  Assessment and plan 1-acute on chronic renal failure/UTI - Patient with chronic kidney disease stage III at baseline - Prerenal azotemia, dehydration and UTI as a cause for acute kidney injury - Continue to maintain adequate hydration; now orally - Continue IV antibiotics and follow culture results - Continue to minimize nephrotoxic agents, hypotension and contrast - Follow renal function trend. - Given ongoing symptoms of dysuria; patient has been started on Pyridium .  2-history of  BPH - Continue Flomax .  3-hypertension - Continue the use of metoprolol  - Continue holding ARB in the setting of acute kidney injury and soft blood pressure. - Follow-up vital signs. - Resuming home diuretic therapy (dose adjusted).  4-metabolic encephalopathy - In the setting of dehydration and UTI most likely - Continue constant reorientation and supportive care - Continue treatment for UTI - Maintain adequate hydration - Follow clinical response. -Overall improving but not completely back to baseline.  5-GERD - Continue PPI.  6-hyperlipidemia - Continue Zocor .  7-history of complete heart block - Status post pacemaker implantation - Continue patient follow-up with cardiology service.  8-mechanical fall/physical deconditioning - Patient has been seen by PT with recommendation for short-term placement into skilled nursing facility at discharge pursued rehabilitation and conditioning.  9-type 2 diabetes with nephropathy - A1c 6.7 - Continue sliding scale insulin  and follow CBG fluctuation.  10-mild transient hypoxia - 2 L nasal cannula supplementation provided - Chest x-ray demonstrating some vascular congestion without interstitial edema and chronic emphysematous changes - Continue as needed bronchodilator management - Continue home diuretic therapy.   Subjective:  Improving stable mentation on today's exam; oriented x 3.  No chest pain, no nausea, no vomiting.  Complaining of intermittent dysuria and frequency.  2 L of cannula in place with good saturation.  Physical Exam: Vitals:   01/30/24 0447 01/30/24 1256 01/30/24 1333 01/30/24 1450  BP: (!) 147/68 (!) 171/60  138/60  Pulse: 66 81    Resp: 18     Temp: 97.7 F (36.5 C) 97.8 F (36.6 C)    TempSrc: Oral Oral    SpO2: 97% 100% 99%   Weight:      Height:  General exam: Alert, awake, oriented x 3; following commands appropriately and reporting some intermittent dysuria and frequency.  No hematuria.   Afebrile Respiratory system: Good air movement bilaterally; no using accessory muscles.  Positive scattered rhonchi with mild expiratory wheezing appreciated on exam. Cardiovascular system: Rate controlled. No rubs or gallops; no JVD. Gastrointestinal system: Abdomen is nondistended, soft and nontender. No organomegaly or masses felt. Normal bowel sounds heard. Central nervous system: Generally weak.  No focal neurological deficits. Extremities: No cyanosis or clubbing.  Trace edema appreciated bilaterally. Skin: No petechia. Psychiatry: Judgement and insight appear normal.    Latest data Reviewed: Basic metabolic panel: Sodium 135, potassium 4.4, chloride 105, bicarb 23, BUN 36, creatinine 1.6 GFR 41 CBC: WBCs 9.2, hemoglobin 10.7 and platelet count 154K B12: 412 Hemoglobin A1c 6.7 Vitamin D : 55.7 TSH: 0.829    Family Communication: No family at bedside.  Disposition: Status is: Inpatient Remains inpatient appropriate because: Continue IV antibiotics.  Skilled nursing facility once medically stable more than 2 pursued rehabilitation and conditioning.  Time spent: 50 minutes  Author: Eric Nunnery, MD 01/30/2024 4:18 PM  For on call review [email or web address].

## 2024-01-30 NOTE — Plan of Care (Signed)
  Problem: Nutritional: Goal: Maintenance of adequate nutrition will improve Outcome: Progressing   Problem: Coping: Goal: Level of anxiety will decrease Outcome: Progressing   Problem: Pain Managment: Goal: General experience of comfort will improve and/or be controlled Outcome: Progressing

## 2024-01-30 NOTE — Plan of Care (Signed)
  Problem: Tissue Perfusion: Goal: Adequacy of tissue perfusion will improve Outcome: Progressing   Problem: Clinical Measurements: Goal: Ability to maintain clinical measurements within normal limits will improve Outcome: Progressing Goal: Will remain free from infection Outcome: Progressing Goal: Diagnostic test results will improve Outcome: Progressing

## 2024-01-30 NOTE — Progress Notes (Signed)
   01/30/24 1607  SNF Authorization Status  SNF AUTH USER ROLE CMRN  Date SNF Authorization Complete 01/30/24  SNF Insurance Authorization Status Approved ([id number]  exp 9/3)

## 2024-01-31 DIAGNOSIS — N390 Urinary tract infection, site not specified: Secondary | ICD-10-CM | POA: Diagnosis not present

## 2024-01-31 DIAGNOSIS — J42 Unspecified chronic bronchitis: Secondary | ICD-10-CM

## 2024-01-31 DIAGNOSIS — G9341 Metabolic encephalopathy: Secondary | ICD-10-CM | POA: Diagnosis not present

## 2024-01-31 DIAGNOSIS — N179 Acute kidney failure, unspecified: Secondary | ICD-10-CM | POA: Diagnosis not present

## 2024-01-31 LAB — GLUCOSE, CAPILLARY
Glucose-Capillary: 110 mg/dL — ABNORMAL HIGH (ref 70–99)
Glucose-Capillary: 117 mg/dL — ABNORMAL HIGH (ref 70–99)
Glucose-Capillary: 127 mg/dL — ABNORMAL HIGH (ref 70–99)
Glucose-Capillary: 159 mg/dL — ABNORMAL HIGH (ref 70–99)

## 2024-01-31 MED ORDER — CEFADROXIL 500 MG PO CAPS
500.0000 mg | ORAL_CAPSULE | Freq: Two times a day (BID) | ORAL | Status: DC
  Administered 2024-01-31 – 2024-02-01 (×3): 500 mg via ORAL
  Filled 2024-01-31 (×5): qty 1

## 2024-01-31 MED ORDER — BUDESONIDE 0.5 MG/2ML IN SUSP
0.5000 mg | Freq: Two times a day (BID) | RESPIRATORY_TRACT | Status: DC
  Administered 2024-01-31 – 2024-02-02 (×4): 0.5 mg via RESPIRATORY_TRACT
  Filled 2024-01-31 (×4): qty 2

## 2024-01-31 MED ORDER — ARFORMOTEROL TARTRATE 15 MCG/2ML IN NEBU
15.0000 ug | INHALATION_SOLUTION | Freq: Two times a day (BID) | RESPIRATORY_TRACT | Status: DC
  Administered 2024-01-31 – 2024-02-02 (×4): 15 ug via RESPIRATORY_TRACT
  Filled 2024-01-31 (×4): qty 2

## 2024-01-31 NOTE — Progress Notes (Signed)
 Post-neb tx, resp less labored but continues to have insp and exp wheezing noted, mainly in upper lobes bilaterally. Pt now able to talk in full sentences without difficulty or coughing. Pt was able to cough up small amount of yellow colored, thick/tenacious mucous during neb. Pt instructed by RT Jamie on use of IS and flutter valve to assist in removal of mucous, pt with good return demonstrations.

## 2024-01-31 NOTE — Progress Notes (Signed)
 Progress Note   Patient: Guy Roberson FMW:[id number] DOB: [birth year] DOA: 01/27/2024     3 DOS: the patient was seen and examined on 01/31/2024   Brief hospital admission narrative course: Guy Roberson is a 89 y.o. male with medical history significant of BPH, GERD, hypertension, hyperlipidemia, chronic kidney disease stage IIIa, history of complete heart block (status post pacemaker implantation) and history of coronary artery disease; who presented to the hospital secondary to confusion and mechanical fall.  Per family recent diagnosis for UTI by PCP, started on oral antibiotics.  Patient has been intermittently confused, forgetting things and mixing up information and statements.  No focal neurologic deficits reported. - There has not been any fever, chills, chest pain, shortness of breath, nausea, vomiting, hematuria, melena, hematochezia or any focal weakness.   Of note, there have been some associated anorexia.   Workup in the ED demonstrating negative CT head for acute intracranial normalities, no acute cervical fracture or findings suggesting the presence of pneumonia.  Urinalysis with positive leukocyte esterase but no nitrites; blood work with normal WBCs but with worsening creatinine demonstrating acute on chronic renal failure.   Patient received IV fluids, cultures taken and he was started on IV antibiotics.  TRH contacted to place patient in the hospital for further evaluation and management.  Assessment and plan 1-acute on chronic renal failure/providencia rettgeri UTI - Patient with chronic kidney disease stage III at baseline - Prerenal azotemia, dehydration and UTI as a cause for acute kidney injury - Continue to maintain adequate hydration; now orally - Antibiotics will be transitioned to oral route following sensitivity; Microorganism is resistant to ampicillin and nitrofurantoin . - Continue to minimize nephrotoxic agents, hypotension and contrast - Follow renal function  trend. - Given ongoing symptoms of dysuria; patient has been started on Pyridium .  2-history of BPH - Continue Flomax .  3-hypertension - Continue the use of metoprolol  - Continue holding ARB in the setting of acute kidney injury and soft blood pressure. - Follow-up vital signs. - Resuming home diuretic therapy (dose adjusted).  4-metabolic encephalopathy - In the setting of dehydration and UTI most likely - Continue constant reorientation and supportive care - Continue treatment for UTI - Maintain adequate hydration - Follow clinical response. -Overall improving but not completely back to baseline.  5-GERD - Continue PPI.  6-hyperlipidemia - Continue Zocor .  7-history of complete heart block - Status post pacemaker implantation - Continue patient follow-up with cardiology service.  8-mechanical fall/physical deconditioning - Patient has been seen by PT with recommendation for short-term placement into skilled nursing facility at discharge pursued rehabilitation and conditioning.  9-type 2 diabetes with nephropathy - A1c 6.7 - Continue sliding scale insulin  and follow CBG fluctuation.  10-mild transient hypoxia - 2 L nasal cannula supplementation provided - Chest x-ray demonstrating some vascular congestion without interstitial edema and chronic emphysematous changes - Will continue bronchodilator management; but will add mucolytic's, Pulmicort  and Brovana  to his regimen. - Continue home diuretic therapy. - Assess desaturation screening.   Subjective:  Generally weak and deconditioned; complaining of mildly productive coughing spells and increased expiratory wheezing.  2 L in place.  No crackles on exam.  Patient is afebrile.  Physical Exam: Vitals:   01/30/24 2009 01/31/24 0533 01/31/24 0929 01/31/24 0949  BP: (!) 131/46 (!) 156/56    Pulse: 80 71 81 99  Resp: 20 20 (!) 24 (!) 22  Temp: 97.7 F (36.5 C) 98.4 F (36.9 C)    TempSrc: Oral Oral  SpO2: 99% 98%  98% 97%  Weight:      Height:       General exam: Alert, awake, oriented x 3; reports overnight experiencing increased respiratory wheezing and mildly productive coughing spell.  2 L nasal cannula in place with good saturation. Respiratory system: Positive rhonchi and mild expiratory wheezing; Not using accessory muscles.  2 L nasal cannula in place. Cardiovascular system: No rubs, no gallops, no JVD. Gastrointestinal system: Abdomen is nondistended, soft and nontender.  Positive bowel sounds. Central nervous system: No focal neurological deficits. Extremities: No cyanosis or clubbing.  Trace edema appreciated on exam. Skin: No petechiae. Psychiatry: Judgement and insight appear normal. Mood & affect appropriate.   Latest data Reviewed: Basic metabolic panel: Sodium 135, potassium 4.4, chloride 105, bicarb 23, BUN 36, creatinine 1.6 GFR 41 CBC: WBCs 9.2, hemoglobin 10.7 and platelet count 154K B12: 412 Hemoglobin A1c 6.7 Vitamin D : 55.7 TSH: 0.829   Family Communication: No family at bedside.  Disposition: Status is: Inpatient Remains inpatient appropriate because: Continue IV antibiotics.  Skilled nursing facility once medically stable more than 2 pursued rehabilitation and conditioning.  Time spent: 50 minutes  Author: Eric Nunnery, MD 01/31/2024 1:42 PM  For on call review [email or web address].

## 2024-01-31 NOTE — Plan of Care (Signed)

## 2024-01-31 NOTE — Progress Notes (Signed)
   01/31/24 0929  Vitals  Patient Position (if appropriate) Sitting  Pulse Rate 81  Pulse Rate Source Dinamap  Resp (!) 24  Level of Consciousness  Level of Consciousness Alert  MEWS COLOR  MEWS Score Color Green  Oxygen Therapy  SpO2 98 %  O2 Device Nasal Cannula  O2 Flow Rate (L/min) 2 L/min  Pain Assessment  Pain Scale 0-10  Pain Score 0  MEWS Score  MEWS Temp 0  MEWS Systolic 0  MEWS Pulse 0  MEWS RR 1  MEWS LOC 0  MEWS Score 1   Pt awakened for breakfast. A&O x3, answers questions appropriately. Denies any c/o pain. Pt noted to have both insp and exp wheezing bilaterally, congested cough, non-productive. Pt eating breakfast and states that he had a rough night last night with two episodes of shortness of breath and coughing. Current SaO2 as above. RT notified for prn neb tx.
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
## 2024-02-01 DIAGNOSIS — N390 Urinary tract infection, site not specified: Secondary | ICD-10-CM | POA: Diagnosis not present

## 2024-02-01 DIAGNOSIS — J42 Unspecified chronic bronchitis: Secondary | ICD-10-CM | POA: Diagnosis not present

## 2024-02-01 DIAGNOSIS — N179 Acute kidney failure, unspecified: Secondary | ICD-10-CM | POA: Diagnosis not present

## 2024-02-01 DIAGNOSIS — G9341 Metabolic encephalopathy: Secondary | ICD-10-CM | POA: Diagnosis not present

## 2024-02-01 LAB — BASIC METABOLIC PANEL WITH GFR
Anion gap: 6 (ref 5–15)
BUN: 29 mg/dL — ABNORMAL HIGH (ref 8–23)
CO2: 28 mmol/L (ref 22–32)
Calcium: 9.5 mg/dL (ref 8.9–10.3)
Chloride: 102 mmol/L (ref 98–111)
Creatinine, Ser: 1.34 mg/dL — ABNORMAL HIGH (ref 0.61–1.24)
GFR, Estimated: 51 mL/min — ABNORMAL LOW (ref >60)
Glucose, Bld: 149 mg/dL — ABNORMAL HIGH (ref 70–99)
Potassium: 4 mmol/L (ref 3.5–5.1)
Sodium: 136 mmol/L (ref 135–145)

## 2024-02-01 LAB — GLUCOSE, CAPILLARY
Glucose-Capillary: 120 mg/dL — ABNORMAL HIGH (ref 70–99)
Glucose-Capillary: 187 mg/dL — ABNORMAL HIGH (ref 70–99)
Glucose-Capillary: 142 mg/dL — ABNORMAL HIGH (ref 70–99)
Glucose-Capillary: 146 mg/dL — ABNORMAL HIGH (ref 70–99)

## 2024-02-01 MED ORDER — SODIUM CHLORIDE 0.9 % IV SOLN
1.0000 g | INTRAVENOUS | Status: AC
  Administered 2024-02-01 – 2024-02-02 (×2): 1 g via INTRAVENOUS
  Filled 2024-02-01 (×2): qty 10

## 2024-02-01 NOTE — Progress Notes (Signed)
 Progress Note   Patient: Guy Roberson FMW:[id number] DOB: 1931/09/16 DOA: 01/27/2024     4 DOS: the patient was seen and examined on 02/01/2024   Brief hospital admission narrative course: Guy Roberson is a 89 y.o. male with medical history significant of BPH, GERD, hypertension, hyperlipidemia, chronic kidney disease stage IIIa, history of complete heart block (status post pacemaker implantation) and history of coronary artery disease; who presented to the hospital secondary to confusion and mechanical fall.  Per family recent diagnosis for UTI by PCP, started on oral antibiotics.  Patient has been intermittently confused, forgetting things and mixing up information and statements.  No focal neurologic deficits reported. - There has not been any fever, chills, chest pain, shortness of breath, nausea, vomiting, hematuria, melena, hematochezia or any focal weakness.   Of note, there have been some associated anorexia.   Workup in the ED demonstrating negative CT head for acute intracranial normalities, no acute cervical fracture or findings suggesting the presence of pneumonia.  Urinalysis with positive leukocyte esterase but no nitrites; blood work with normal WBCs but with worsening creatinine demonstrating acute on chronic renal failure.   Patient received IV fluids, cultures taken and he was started on IV antibiotics.  TRH contacted to place patient in the hospital for further evaluation and management.  Assessment and plan 1-acute on chronic renal failure/providencia rettgeri UTI - Patient with chronic kidney disease stage III at baseline - Prerenal azotemia, dehydration and UTI as a cause for acute kidney injury - Continue to maintain adequate hydration; now orally - Antibiotics will be transitioned to oral route at time of discharge using Vantin; while inpatient continue treatment with Rocephin . (Planning for a total of 8 days antibiotic therapy). - Continue to minimize nephrotoxic  agents, hypotension and contrast. - Follow renal function trend. - Given ongoing symptoms of dysuria; patient has been started on Pyridium .  2-history of BPH - Continue Flomax .  3-hypertension - Continue the use of metoprolol  - Continue holding ARB in the setting of acute kidney injury and soft blood pressure. - Follow-up vital signs. - Resuming home diuretic therapy (dose adjusted).  4-metabolic encephalopathy - In the setting of dehydration and UTI most likely - Continue constant reorientation and supportive care - Continue treatment for UTI - Maintain adequate hydration - Follow clinical response. -Overall improving but not completely back to baseline.  5-GERD - Continue PPI.  6-hyperlipidemia - Continue Zocor .  7-history of complete heart block - Status post pacemaker implantation - Continue patient follow-up with cardiology service.  8-mechanical fall/physical deconditioning - Patient has been seen by PT with recommendation for short-term placement into skilled nursing facility at discharge pursued rehabilitation and conditioning.  9-type 2 diabetes with nephropathy - A1c 6.7 - Continue sliding scale insulin  and follow CBG fluctuation.  10-mild transient hypoxia - 2 L nasal cannula supplementation provided - Chest x-ray demonstrating some vascular congestion without interstitial edema and chronic emphysematous changes - Will continue bronchodilator management; but will add mucolytic's, Pulmicort  and Brovana  to his regimen. - Continue home diuretic therapy (no frank CHF exacerbation appreciated).. - Assess desaturation screening.  11-chronic diastolic heart failure - Compensated for the most part - Continue low-sodium diet, diuretic therapy and adequate hydration - No crackles appreciated on exam.  12-social/ethics - Goals of care discussion with patient and son at bedside - Wanting to continue current treatment and if needed to pursuit resuscitation without  intubation. - Limited DNR for CODE STATUS has been ordered   Subjective:  Generally weak;  no chest pain, no fever, no nausea, no vomiting.  Overall feeling better.  On room air with good saturation on today's examination.  Physical Exam: Vitals:   02/01/24 0517 02/01/24 0733 02/01/24 0945 02/01/24 1303  BP: 120/82  (!) 123/45 (!) 161/64  Pulse: 60  73 79  Resp: 20  18   Temp: 98.3 F (36.8 C)  97.6 F (36.4 C) 97.7 F (36.5 C)  TempSrc: Oral  Oral Oral  SpO2: 98% 97% 98% 96%  Weight:      Height:       General exam: Alert, awake, oriented x 3; feeling better and breathing easier.  Reports no dysuria.  Afebrile. Respiratory system: Positive scattered rhonchi; mild expiratory wheezing.  No using accessory muscle.  Good saturation on room air.  No crackles on exam. Cardiovascular system:RRR.  No rubs, no gallops, no JVD. Gastrointestinal system: Abdomen is nondistended, soft and nontender. No organomegaly or masses felt. Normal bowel sounds heard. Central nervous system: No focal neurological deficits. Extremities: No Cyanosis or clubbing; trace edema appreciated bilaterally. Skin: No petechiae. Psychiatry: Judgement and insight appear normal.  Flat affect on exam.  Latest data Reviewed: Basic metabolic panel: Sodium 135, potassium 4.4, chloride 105, bicarb 23, BUN 36, creatinine 1.6 GFR 41 CBC: WBCs 9.2, hemoglobin 10.7 and platelet count 154K B12: 412 Hemoglobin A1c 6.7 Vitamin D : 55.7 TSH: 0.829   Family Communication: Son updated at bedside.  Disposition: Status is: Inpatient Remains inpatient appropriate because: Continue IV antibiotics.  Skilled nursing facility once medically stable more than 2 pursued rehabilitation and conditioning.  Time spent: 50 minutes  Author: Eric Nunnery, MD 02/01/2024 6:30 PM  For on call review [email or web address].
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
## 2024-02-01 NOTE — TOC Progression Note (Signed)
 Transition of Care [facility]) - Progression Note    Patient Details  Name: AMAD MAU MRN: [id number] Date of Birth: March 20, 1932  Transition of Care [facility]) CM/SW Contact  Sharlyne Stabs, RN Phone Number: 02/01/2024, 11:02 AM  Clinical Narrative:   Discussed bed offers with his son. [facility] offered, they are accepting.  DC planning for tomorrow.     Expected Discharge Plan: Skilled Nursing Facility Barriers to Discharge: Continued Medical Work up    Expected Discharge Plan and Services       Living arrangements for the past 2 months: Single Family Home                     Social Drivers of Health (SDOH) Interventions SDOH Screenings   Food Insecurity: Patient Unable To Answer (01/27/2024)  Housing: Unknown (01/27/2024)  Transportation Needs: Patient Unable To Answer (01/27/2024)  Utilities: Patient Unable To Answer (01/27/2024)  Depression (PHQ2-9): Low Risk  (08/17/2022)  Social Connections: Patient Unable To Answer (01/27/2024)  Tobacco Use: High Risk (01/27/2024)    Readmission Risk Interventions    08/12/2022    1:48 PM 03/06/2022    2:29 PM  Readmission Risk Prevention Plan  Transportation Screening Complete Complete  Home Care Screening  Complete  Medication Review (RN CM)  Complete  HRI or Home Care Consult Complete   Social Work Consult for Recovery Care Planning/Counseling Complete   Palliative Care Screening Not Applicable   Medication Review Oceanographer) Complete

## 2024-02-01 NOTE — Plan of Care (Addendum)

## 2024-02-02 DIAGNOSIS — N3000 Acute cystitis without hematuria: Secondary | ICD-10-CM | POA: Diagnosis not present

## 2024-02-02 DIAGNOSIS — I2510 Atherosclerotic heart disease of native coronary artery without angina pectoris: Secondary | ICD-10-CM | POA: Diagnosis not present

## 2024-02-02 DIAGNOSIS — G9341 Metabolic encephalopathy: Secondary | ICD-10-CM | POA: Diagnosis not present

## 2024-02-02 DIAGNOSIS — R4189 Other symptoms and signs involving cognitive functions and awareness: Secondary | ICD-10-CM | POA: Diagnosis not present

## 2024-02-02 DIAGNOSIS — I700 Atherosclerosis of aorta: Secondary | ICD-10-CM | POA: Diagnosis not present

## 2024-02-02 DIAGNOSIS — E785 Hyperlipidemia, unspecified: Secondary | ICD-10-CM | POA: Diagnosis not present

## 2024-02-02 DIAGNOSIS — N170 Acute kidney failure with tubular necrosis: Secondary | ICD-10-CM

## 2024-02-02 DIAGNOSIS — K219 Gastro-esophageal reflux disease without esophagitis: Secondary | ICD-10-CM | POA: Diagnosis not present

## 2024-02-02 DIAGNOSIS — E1159 Type 2 diabetes mellitus with other circulatory complications: Secondary | ICD-10-CM | POA: Diagnosis not present

## 2024-02-02 DIAGNOSIS — J9601 Acute respiratory failure with hypoxia: Secondary | ICD-10-CM | POA: Diagnosis not present

## 2024-02-02 DIAGNOSIS — I129 Hypertensive chronic kidney disease with stage 1 through stage 4 chronic kidney disease, or unspecified chronic kidney disease: Secondary | ICD-10-CM | POA: Diagnosis not present

## 2024-02-02 DIAGNOSIS — E441 Mild protein-calorie malnutrition: Secondary | ICD-10-CM | POA: Diagnosis not present

## 2024-02-02 DIAGNOSIS — I5032 Chronic diastolic (congestive) heart failure: Secondary | ICD-10-CM | POA: Diagnosis not present

## 2024-02-02 DIAGNOSIS — E876 Hypokalemia: Secondary | ICD-10-CM | POA: Diagnosis not present

## 2024-02-02 DIAGNOSIS — N189 Chronic kidney disease, unspecified: Secondary | ICD-10-CM | POA: Diagnosis not present

## 2024-02-02 DIAGNOSIS — I152 Hypertension secondary to endocrine disorders: Secondary | ICD-10-CM | POA: Diagnosis not present

## 2024-02-02 DIAGNOSIS — E041 Nontoxic single thyroid nodule: Secondary | ICD-10-CM | POA: Diagnosis not present

## 2024-02-02 DIAGNOSIS — N390 Urinary tract infection, site not specified: Secondary | ICD-10-CM | POA: Diagnosis not present

## 2024-02-02 DIAGNOSIS — N1831 Chronic kidney disease, stage 3a: Secondary | ICD-10-CM | POA: Diagnosis not present

## 2024-02-02 DIAGNOSIS — I110 Hypertensive heart disease with heart failure: Secondary | ICD-10-CM | POA: Diagnosis not present

## 2024-02-02 DIAGNOSIS — R29818 Other symptoms and signs involving the nervous system: Secondary | ICD-10-CM | POA: Diagnosis not present

## 2024-02-02 DIAGNOSIS — R0902 Hypoxemia: Secondary | ICD-10-CM | POA: Diagnosis not present

## 2024-02-02 DIAGNOSIS — E1169 Type 2 diabetes mellitus with other specified complication: Secondary | ICD-10-CM | POA: Diagnosis not present

## 2024-02-02 LAB — GLUCOSE, CAPILLARY
Glucose-Capillary: 123 mg/dL — ABNORMAL HIGH (ref 70–99)
Glucose-Capillary: 160 mg/dL — ABNORMAL HIGH (ref 70–99)

## 2024-02-02 MED ORDER — METHYLPREDNISOLONE 4 MG PO TBPK: ORAL_TABLET | ORAL | 0 refills | Status: DC

## 2024-02-02 MED ORDER — GUAIFENESIN-DM 100-10 MG/5ML PO SYRP: 10.0000 mL | ORAL_SOLUTION | ORAL | Status: DC | PRN

## 2024-02-02 MED ORDER — IPRATROPIUM-ALBUTEROL 0.5-2.5 (3) MG/3ML IN SOLN
3.0000 mL | Freq: Four times a day (QID) | RESPIRATORY_TRACT | Status: DC
  Administered 2024-02-02: 3 mL via RESPIRATORY_TRACT
  Filled 2024-02-02: qty 3

## 2024-02-02 MED ORDER — GUAIFENESIN-DM 100-10 MG/5ML PO SYRP: 10.0000 mL | ORAL_SOLUTION | ORAL | 0 refills | Status: AC | PRN

## 2024-02-02 MED ORDER — CIPROFLOXACIN HCL 500 MG PO TABS: 500.0000 mg | ORAL_TABLET | Freq: Two times a day (BID) | ORAL | 0 refills | Status: AC

## 2024-02-02 MED ORDER — IPRATROPIUM-ALBUTEROL 0.5-2.5 (3) MG/3ML IN SOLN: 3.0000 mL | Freq: Four times a day (QID) | RESPIRATORY_TRACT | 1 refills | Status: DC | PRN

## 2024-02-02 MED ORDER — INSULIN ASPART 100 UNIT/ML IJ SOLN: 0.0000 [IU] | Freq: Every day | INTRAMUSCULAR | 11 refills | Status: DC

## 2024-02-02 MED ORDER — LACTINEX PO CHEW: 1.0000 | CHEWABLE_TABLET | Freq: Three times a day (TID) | ORAL | 0 refills | Status: DC

## 2024-02-02 MED ORDER — FUROSEMIDE 40 MG PO TABS: 40.0000 mg | ORAL_TABLET | Freq: Every day | ORAL | 1 refills | Status: DC

## 2024-02-02 MED ORDER — LEVALBUTEROL HCL 1.25 MG/0.5ML IN NEBU: 1.2500 mg | INHALATION_SOLUTION | Freq: Four times a day (QID) | RESPIRATORY_TRACT | Status: DC

## 2024-02-02 MED ORDER — PHENAZOPYRIDINE HCL 100 MG PO TABS: 100.0000 mg | ORAL_TABLET | Freq: Two times a day (BID) | ORAL | 0 refills | Status: DC

## 2024-02-02 MED ORDER — IPRATROPIUM BROMIDE 0.02 % IN SOLN: 0.5000 mg | Freq: Four times a day (QID) | RESPIRATORY_TRACT | Status: DC

## 2024-02-02 MED ORDER — ACETAMINOPHEN 325 MG PO TABS: 650.0000 mg | ORAL_TABLET | Freq: Four times a day (QID) | ORAL | 0 refills | Status: DC | PRN

## 2024-02-02 NOTE — Discharge Summary (Signed)
 Physician Discharge Summary   Patient: Guy Roberson MRN: [id number] DOB: 05-31-1932  Admit date:     01/27/2024  Discharge date: 02/02/24  Discharge Physician: Adriana DELENA Grams   PCP: [name], MD   Recommendations at discharge:   Follow-up with PCP in 1 week Continue PT OT, fall precautions, Follow-up with palliative care as an outpatient Patient may need oxygen with exertion to maintain O2 sat greater 90%  Discharge Diagnoses: Principal Problem:   Acute on chronic renal failure (HCC) Active Problems:   Acute metabolic encephalopathy   Urinary tract infection without hematuria  Guy Roberson is a 89 y.o. male with medical history significant of BPH, GERD, hypertension, hyperlipidemia, chronic kidney disease stage IIIa, history of complete heart block (status post pacemaker implantation) and history of coronary artery disease; who presented to the hospital secondary to confusion and mechanical fall.  Per family recent diagnosis for UTI by PCP, started on oral antibiotics.  Patient has been intermittently confused, forgetting things and mixing up information and statements.  No focal neurologic deficits reported. - There has not been any fever, chills, chest pain, shortness of breath, nausea, vomiting, hematuria, melena, hematochezia or any focal weakness.   Of note, there have been some associated anorexia.   Workup in the ED demonstrating negative CT head for acute intracranial normalities, no acute cervical fracture or findings suggesting the presence of pneumonia.  Urinalysis with positive leukocyte esterase but no nitrites; blood work with normal WBCs but with worsening creatinine demonstrating acute on chronic renal failure.   Patient received IV fluids, cultures taken and he was started on IV antibiotics.  TRH contacted to place patient in the hospital for further evaluation and management.    1-acute on chronic renal failure/providencia rettgeri UTI - Patient with  chronic kidney disease stage III at baseline - Prerenal azotemia, dehydration and UTI as a cause for acute kidney injury - Continue to maintain adequate hydration; now orally - Antibiotics transitioned to oral route at discharge using Vantin; while inpatient continue treatment with Rocephin . (Planning for 5 more days antibiotic therapy-p.o. ciprofloxacin ).  . - Given ongoing symptoms of dysuria; patient has been started on Pyridium .   2-history of BPH - Continue Flomax .   3-hypertension - Continue the use of metoprolol  - Continue holding ARB in the setting of acute kidney injury and soft blood pressure. - Resuming home diuretic therapy (dose adjusted).   4-metabolic encephalopathy - In the setting of dehydration and UTI most likely - Continue constant reorientation and supportive care - Continue treatment for UTI - Maintain adequate hydration  -Overall improved    5-GERD - Continue PPI.   6-hyperlipidemia - Continue Zocor .   7-history of complete heart block - Status post pacemaker implantation - Continue patient follow-up with cardiology service.   8-mechanical fall/physical deconditioning - Patient has been seen by PT with recommendation for short-term placement into skilled nursing facility at discharge pursued rehabilitation and conditioning.   9-type 2 diabetes with nephropathy - A1c 6.7 - Continue sliding scale insulin  and follow CBG fluctuation.   10-mild transient hypoxia - 2 L nasal cannula supplementation provided - Chest x-ray demonstrating some vascular congestion without interstitial edema and chronic emphysematous changes - Will continue bronchodilator management; mucolytic's,  As needed Pulmicort  and Brovana  may be prescribed if needed - Continue home diuretic therapy (no frank CHF exacerbation appreciated).. - Continue to have wheezing, rhonchi, Medrol  Dosepak ordered, along with mucolytics, nebs    11-chronic diastolic heart failure - Compensated for  the most part - Continue low-sodium diet, diuretic therapy and adequate hydration - No crackles appreciated on exam.   12-social/ethics - Goals of care discussion with patient and son at bedside - Wanting to continue current treatment and if needed to pursuit resuscitation without intubation. - Limited DNR for CODE STATUS has been ordered     Disposition: Skilled nursing facility Diet recommendation:  Discharge Diet Orders (From admission, onward)     Start     Ordered   02/02/24 0000  Diet - low sodium heart healthy        02/02/24 1045           Cardiac and Carb modified diet DISCHARGE MEDICATION: Allergies as of 02/02/2024       Reactions   Other Other (See Comments)   All sleep aids Makes him crazy, hallucinations, vivid dreams   Penicillins Other (See Comments)   Unknown  Childhood allergy Disorder of immune function        Medication List     STOP taking these medications    lisinopril  10 MG tablet Commonly known as: ZESTRIL        TAKE these medications    acetaminophen  325 MG tablet Commonly known as: TYLENOL  Take 2 tablets (650 mg total) by mouth every 6 (six) hours as needed for mild pain (pain score 1-3) or fever (or Fever >/= 101).   albuterol  108 (90 Base) MCG/ACT inhaler Commonly known as: VENTOLIN  HFA Inhale 2 puffs into the lungs every 6 (six) hours as needed for wheezing or shortness of breath.   ciprofloxacin  500 MG tablet Commonly known as: Cipro  Take 1 tablet (500 mg total) by mouth 2 (two) times daily for 5 days. Start taking on: February 03, 2024   furosemide  40 MG tablet Commonly known as: Lasix  Take 1 tablet (40 mg total) by mouth daily. What changed: how much to take   guaiFENesin -dextromethorphan  100-10 MG/5ML syrup Commonly known as: ROBITUSSIN DM Take 10 mLs by mouth every 4 (four) hours as needed for cough.   insulin  aspart 100 UNIT/ML injection Commonly known as: novoLOG  Inject 0-5 Units into the skin at  bedtime. CBG 70 - 120: 0 units CBG 121 - 150: 0 units CBG 151 - 200: 0 units CBG 201 - 250: 2 units CBG 251 - 300: 3 units CBG 301 - 350: 4 units CBG 351 - 400: 5 units CBG > 400: call MD and obtain STAT lab verification   ipratropium-albuterol  0.5-2.5 (3) MG/3ML Soln Commonly known as: DUONEB Take 3 mLs by nebulization every 6 (six) hours as needed.   lactobacillus acidophilus & bulgar chewable tablet Chew 1 tablet by mouth 3 (three) times daily with meals for 10 days.   methylPREDNISolone  4 MG Tbpk tablet Commonly known as: MEDROL  DOSEPAK Medrol  Dosepak take as instructed   metoprolol  tartrate 50 MG tablet Commonly known as: LOPRESSOR  TAKE (1) TABLET BY MOUTH TWICE DAILY. What changed: See the new instructions.   pantoprazole  20 MG tablet Commonly known as: PROTONIX  TAKE ONE TABLET (20MG  TOTAL) BY MOUTH TWO TIMES DAILY BEFORE A MEAL What changed:  how much to take how to take this when to take this   phenazopyridine  100 MG tablet Commonly known as: PYRIDIUM  Take 1 tablet (100 mg total) by mouth 2 (two) times daily.   potassium chloride  SA 20 MEQ tablet Commonly known as: KLOR-CON  M TAKE (1) TABLET BY MOUTH ONCE DAILY TO REPLACE POTASSIUM What changed: See the new instructions.   simvastatin  20 MG tablet  Commonly known as: ZOCOR  Take 1 tablet (20 mg total) by mouth daily.   Sure Comfort Pen Needles 31G X 5 MM Misc Generic drug: Insulin  Pen Needle USE AS DIRECTED UP TO 3ITIMES DAILY.   tamsulosin  0.4 MG Caps capsule Commonly known as: FLOMAX  Take 1 capsule (0.4 mg total) by mouth daily after supper. What changed: when to take this               Discharge Care Instructions  (From admission, onward)           Start     Ordered   02/02/24 0000  Discharge wound care:       Comments: Per wound care instructions   02/02/24 1045            Contact information for after-discharge care     Destination     [facility] .   Service: Skilled  Nursing Contact information: [address]   [phone]                    Discharge Exam: Fredricka Weights   01/27/24 0723  Weight: 74.4 kg        General:  AAO x 3,  cooperative, no distress;   HEENT:  Normocephalic, PERRL, otherwise with in Normal limits   Neuro:  CNII-XII intact. , normal motor and sensation, reflexes intact   Lungs:   Clear to auscultation BL, Respirations unlabored,  Diffuse wheezes / No crackles  Cardio:    S1/S2, RRR, No murmure, No Rubs or Gallops   Abdomen:  Soft, non-tender, bowel sounds active all four quadrants, no guarding or peritoneal signs.  Muscular  skeletal:  Limited exam -global generalized weaknesses - in bed, able to move all 4 extremities,   2+ pulses,  symmetric, No pitting edema  Skin:  Dry, warm to touch, negative for any Rashes,  Wounds: Please see nursing documentation          Condition at discharge: fair  The results of significant diagnostics from this hospitalization (including imaging, microbiology, ancillary and laboratory) are listed below for reference.   Imaging Studies: DG CHEST PORT 1 VIEW Result Date: 01/29/2024 EXAM: 1 VIEW XRAY OF THE CHEST 01/29/2024 03:54:49 AM COMPARISON: 01/27/2024 CLINICAL HISTORY: 200808 Hypoxia 200808. hypoxia FINDINGS: LUNGS AND PLEURA: Mild pulmonary edema. Chronic coarsened interstitial markings without overt pulmonary edema. Retrocardiac opacities are slightly improved. HEART AND MEDIASTINUM: Enlarged cardiomediastinal silhouette, unchanged. Post-CABG changes. Left chest cardiac pacing device noted. Atherosclerotic calcifications of aorta. BONES AND SOFT TISSUES: Nondisplaced median sternotomy wires. Persistent elevation of right hemidiaphragm. IMPRESSION: 1. Mild pulmonary edema, improved. 2. Chronic coarsened interstitial markings without overt pulmonary edema. 3. Enlarged cardiomediastinal silhouette, unchanged, with post-CABG changes and  left chest cardiac pacing device. 4. Atherosclerotic calcifications of aorta. 5. Persistent elevation of right hemidiaphragm. Electronically signed by: Lonni Necessary MD 01/29/2024 07:31 AM EDT RP Workstation: [id number]   CT Chest Wo Contrast Result Date: 01/27/2024 CLINICAL DATA:  Respiratory illness, nondiagnostic x-ray. EXAM: CT CHEST WITHOUT CONTRAST TECHNIQUE: Multidetector CT imaging of the chest was performed following the standard protocol without IV contrast. RADIATION DOSE REDUCTION: This exam was performed according to the departmental dose-optimization program which includes automated exposure control, adjustment of the mA and/or kV according to patient size and/or use of iterative reconstruction technique. COMPARISON:  Chest CT 01/27/2024 FINDINGS: Cardiovascular: Dual chamber cardiac pacemaker. Extensive coronary artery calcifications with post CABG changes. Normal caliber of the thoracic aorta with diffuse  atherosclerotic calcifications. Mediastinum/Nodes: Small hiatal hernia. No evidence for chest lymphadenopathy. Left thyroid goiter with a large left substernal nodule measuring up to 4.6 cm. No axillary lymph node enlargement. Lungs/Pleura: Azygous lobe. Question a 6 mm nodule in the right upper lobe on image 3 but this could be related to a vascular structure. Volume loss in the right lower lobe. Severe volume loss or collapse in the right middle lobe. 3 mm calcified granuloma in the right lower lobe on image 86/3. No significant airspace disease or consolidation in the left lower lobe or retrocardiac region. Volume loss or thickening along the right major fissure. No pleural effusions. Upper Abdomen: Elevation of the right hemidiaphragm. Cholelithiasis. Musculoskeletal: No acute bone abnormality. IMPRESSION: 1. Elevation of the right hemidiaphragm with volume loss in the right lower lobe and right middle lobe. Severe volume loss or collapse in the right middle lobe. 2. No focal  abnormality in the left lower lobe or retrocardiac region. 3. Left thyroid goiter with large substernal nodule. Consider further evaluation with a thyroid ultrasound although this area may be difficult to evaluate due to its location. 4. Question a 6 mm nodule in the right upper lobe. There is also focal thickening along the right major fissure which could be associated with volume loss or scarring. Non-contrast chest CT at 6-12 months is recommended. If the nodule is stable at time of repeat CT, then future CT at 18-24 months (from today's scan) is considered optional for low-risk patients, but is recommended for high-risk patients. This recommendation follows the consensus statement: Guidelines for Management of Incidental Pulmonary Nodules Detected on CT Images: From the Fleischner Society 2017; Radiology 2017; 284:228-243. 5. Cholelithiasis. 6.  Aortic Atherosclerosis (ICD10-I70.0). Electronically Signed   By: Juliene Balder M.D.   On: 01/27/2024 10:21   CT Head Wo Contrast Result Date: 01/27/2024 CLINICAL DATA:  Provided history: Head trauma, minor. Neck trauma. Possible unwitnessed fall. Patient found down. EXAM: CT HEAD WITHOUT CONTRAST CT CERVICAL SPINE WITHOUT CONTRAST TECHNIQUE: Multidetector CT imaging of the head and cervical spine was performed following the standard protocol without intravenous contrast. Multiplanar CT image reconstructions of the cervical spine were also generated. RADIATION DOSE REDUCTION: This exam was performed according to the departmental dose-optimization program which includes automated exposure control, adjustment of the mA and/or kV according to patient size and/or use of iterative reconstruction technique. COMPARISON:  Head CT 08/11/2022. FINDINGS: CT HEAD FINDINGS Brain: Generalized cerebral atrophy. Patchy and ill-defined hypoattenuation within the cerebral white matter, nonspecific but compatible with moderate chronic small vessel ischemic disease. There is no acute  intracranial hemorrhage. No demarcated cortical infarct. No extra-axial fluid collection. No evidence of an intracranial mass. No midline shift. Vascular: No hyperdense vessel.  Atherosclerotic calcifications. Skull: No calvarial fracture or aggressive osseous lesion. Visible sinuses/orbits: No mass or acute finding within the imaged orbits. Mild mucosal thickening within the left frontal and right ethmoid sinuses. CT CERVICAL SPINE FINDINGS Alignment: Levocurvature of the cervical spine. 2 mm C3-C4 grade 1 retrolisthesis slight C5-C6 grade 1 anterolisthesis. Slight C6-C7 grade 1 retrolisthesis. Skull base and vertebrae: The basion-dental and atlanto-dental intervals are maintained.No evidence of acute fracture to the cervical spine. Soft tissues and spinal canal: No prevertebral fluid or swelling. No visible canal hematoma. Multinodular and enlarged thyroid gland. The largest nodule is present within the left lobe, measuring 3.5 cm. Disc levels: Cervical spondylosis with multilevel disc space narrowing, disc bulges/central disc protrusions, posterior disc osteophyte complexes, uncovertebral hypertrophy and facet arthropathy. Disc space narrowing  is greatest at C3-C4 and C6-C7 (advanced at these levels). Multilevel spinal canal narrowing. Most notably at C3-C4, a posterior disc osteophyte complex results in severe spinal canal stenosis. Multilevel bony neural foraminal narrowing. Degenerative changes also present at the C1-C2 articulation. Upper chest: Separately reported on same day chest CT. IMPRESSION: CT head: 1.  No evidence of an acute intracranial abnormality. 2. Parenchymal atrophy and chronic small vessel ischemic disease. CT cervical spine: 1. No evidence of an acute cervical spine fracture. 2. Levocurvature of the cervical spine. 3. Mild spondylosis at C3-C4, C5-C6 and C6-C7. 4. Cervical spondylosis as described. At C3-C4, a posterior disc osteophyte complex results in severe spinal canal stenosis. 5.  Enlarged and multinodular thyroid gland. The largest nodule is present within the left lobe, measuring 3.5 cm. Given the patient's age, a non-emergent follow-up thyroid ultrasound may be obtained for further evaluation, as clinically appropriate. Electronically Signed   By: Rockey Childs D.O.   On: 01/27/2024 08:58   CT Cervical Spine Wo Contrast Result Date: 01/27/2024 CLINICAL DATA:  Provided history: Head trauma, minor. Neck trauma. Possible unwitnessed fall. Patient found down. EXAM: CT HEAD WITHOUT CONTRAST CT CERVICAL SPINE WITHOUT CONTRAST TECHNIQUE: Multidetector CT imaging of the head and cervical spine was performed following the standard protocol without intravenous contrast. Multiplanar CT image reconstructions of the cervical spine were also generated. RADIATION DOSE REDUCTION: This exam was performed according to the departmental dose-optimization program which includes automated exposure control, adjustment of the mA and/or kV according to patient size and/or use of iterative reconstruction technique. COMPARISON:  Head CT 08/11/2022. FINDINGS: CT HEAD FINDINGS Brain: Generalized cerebral atrophy. Patchy and ill-defined hypoattenuation within the cerebral white matter, nonspecific but compatible with moderate chronic small vessel ischemic disease. There is no acute intracranial hemorrhage. No demarcated cortical infarct. No extra-axial fluid collection. No evidence of an intracranial mass. No midline shift. Vascular: No hyperdense vessel.  Atherosclerotic calcifications. Skull: No calvarial fracture or aggressive osseous lesion. Visible sinuses/orbits: No mass or acute finding within the imaged orbits. Mild mucosal thickening within the left frontal and right ethmoid sinuses. CT CERVICAL SPINE FINDINGS Alignment: Levocurvature of the cervical spine. 2 mm C3-C4 grade 1 retrolisthesis slight C5-C6 grade 1 anterolisthesis. Slight C6-C7 grade 1 retrolisthesis. Skull base and vertebrae: The basion-dental  and atlanto-dental intervals are maintained.No evidence of acute fracture to the cervical spine. Soft tissues and spinal canal: No prevertebral fluid or swelling. No visible canal hematoma. Multinodular and enlarged thyroid gland. The largest nodule is present within the left lobe, measuring 3.5 cm. Disc levels: Cervical spondylosis with multilevel disc space narrowing, disc bulges/central disc protrusions, posterior disc osteophyte complexes, uncovertebral hypertrophy and facet arthropathy. Disc space narrowing is greatest at C3-C4 and C6-C7 (advanced at these levels). Multilevel spinal canal narrowing. Most notably at C3-C4, a posterior disc osteophyte complex results in severe spinal canal stenosis. Multilevel bony neural foraminal narrowing. Degenerative changes also present at the C1-C2 articulation. Upper chest: Separately reported on same day chest CT. IMPRESSION: CT head: 1.  No evidence of an acute intracranial abnormality. 2. Parenchymal atrophy and chronic small vessel ischemic disease. CT cervical spine: 1. No evidence of an acute cervical spine fracture. 2. Levocurvature of the cervical spine. 3. Mild spondylosis at C3-C4, C5-C6 and C6-C7. 4. Cervical spondylosis as described. At C3-C4, a posterior disc osteophyte complex results in severe spinal canal stenosis. 5. Enlarged and multinodular thyroid gland. The largest nodule is present within the left lobe, measuring 3.5 cm. Given the patient's age, a  non-emergent follow-up thyroid ultrasound may be obtained for further evaluation, as clinically appropriate. Electronically Signed   By: Rockey Childs D.O.   On: 01/27/2024 08:58   DG Chest Portable 1 View Result Date: 01/27/2024 CLINICAL DATA:  fall EXAM: PORTABLE CHEST 1 VIEW COMPARISON:  02/19/2023. FINDINGS: Low lung volume. Moderately elevated right hemidiaphragm, similar to the prior study. There is nonspecific left retrocardiac opacity partially obscuring the left hemidiaphragm, which may represent  combination of left lung atelectasis and/or consolidation with pleural effusion. Bilateral lung fields are otherwise grossly clear. No dense consolidation. Right lateral costophrenic angle is clear. No pneumothorax on either side. Stable cardio-mediastinal silhouette. There are surgical staples along the heart border and sternotomy wires, status post CABG (coronary artery bypass graft). There is a left sided 2-lead pacemaker. No acute osseous abnormalities. The soft tissues are within normal limits. IMPRESSION: Nonspecific left retrocardiac opacity, as described above. Electronically Signed   By: Ree Molt M.D.   On: 01/27/2024 08:02    Microbiology: Results for orders placed or performed during the hospital encounter of 01/27/24  Urine Culture     Status: Abnormal   Collection Time: 01/27/24  9:37 AM   Specimen: Urine, Clean Catch  Result Value Ref Range Status   Specimen Description   Final    URINE, CLEAN CATCH Performed at [facility], [address] [place], [place] [address]    Special Requests   Final    NONE Reflexed from [id number] Performed at [facility], [address], [place], [place] [address]    Culture 20,000 COLONIES/mL PROVIDENCIA RETTGERI (A)  Final   Report Status 01/29/2024 FINAL  Final   Organism ID, Bacteria PROVIDENCIA RETTGERI (A)  Final      Susceptibility   Providencia rettgeri - MIC*    AMPICILLIN >=32 RESISTANT Resistant     CEFEPIME <=0.12 SENSITIVE Sensitive     CEFTRIAXONE  <=0.25 SENSITIVE Sensitive     GENTAMICIN  <=1 SENSITIVE Sensitive     NITROFURANTOIN  128 RESISTANT Resistant     TRIMETH /SULFA  40 SENSITIVE Sensitive     AMPICILLIN/SULBACTAM >=32 RESISTANT Resistant     PIP/TAZO Value in next row Sensitive ug/mL     <=4 SENSITIVEThis is a modified FDA-approved test that has been validated and its performance characteristics determined by the reporting laboratory.  This laboratory is certified under the Clinical Laboratory Improvement  Amendments CLIA as qualified to perform high complexity clinical laboratory testing.    MEROPENEM Value in next row Sensitive      <=4 SENSITIVEThis is a modified FDA-approved test that has been validated and its performance characteristics determined by the reporting laboratory.  This laboratory is certified under the Clinical Laboratory Improvement Amendments CLIA as qualified to perform high complexity clinical laboratory testing.    * 20,000 COLONIES/mL PROVIDENCIA RETTGERI    Labs: CBC: Recent Labs  Lab 01/27/24 0751 01/28/24 0404  WBC 10.3 9.2  NEUTROABS 8.6*  --   HGB 12.1* 10.7*  HCT 36.0* 32.4*  MCV 93.5 95.0  PLT 148* 154   Basic Metabolic Panel: Recent Labs  Lab 01/27/24 0751 01/27/24 1104 01/28/24 0404 01/29/24 0426 01/30/24 0435 02/01/24 0411  NA 136  --  139 138 135 136  K 4.0  --  4.0 4.1 4.4 4.0  CL 103  --  111 108 105 102  CO2 19*  --  23 23 23 28   GLUCOSE 140*  --  85 131* 99 149*  BUN 37*  --  39* 41*  36* 29*  CREATININE 1.93*  --  2.08* 1.77* 1.61* 1.34*  CALCIUM  10.1  --  9.6 9.6 9.4 9.5  MG  --  2.0  --   --   --   --   PHOS  --  2.6  --   --   --   --    Liver Function Tests: Recent Labs  Lab 01/27/24 0751  AST 29  ALT 26  ALKPHOS 77  BILITOT 0.8  PROT 6.9  ALBUMIN 4.0   CBG: Recent Labs  Lab 02/01/24 0801 02/01/24 1106 02/01/24 1603 02/01/24 2119 02/02/24 0715  GLUCAP 120* 187* 142* 146* 123*    Discharge time spent: greater than 45 minutes.  Signed: Adriana DELENA Grams, MD [facility] 02/02/2024
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
## 2024-02-02 NOTE — Care Management Important Message (Signed)
 Important Message  Patient Details  Name: Guy Roberson MRN: [id number] Date of Birth: [birth year]   Important Message Given:  Yes - Medicare IM     Darletta Noblett L Haylen Bellotti 02/02/2024, 11:37 AM

## 2024-02-02 NOTE — TOC Transition Note (Signed)
 Transition of Care [facility]) - Discharge Note   Patient Details  Name: Guy Roberson MRN: [id number] Date of Birth: Nov 09, 1931  [facility]) CM/SW Contact:  Sharlyne Stabs, RN Phone Number: 02/02/2024, 11:23 AM   Clinical Narrative:   Patient medically ready to discharge to [facility]. DC summary sent, Tami provided room 160. RN will call report. CM updated his family.     Final next level of care: Skilled Nursing Facility Barriers to Discharge: Barriers Resolved   Patient Goals and CMS Choice Patient states their goals for this hospitalization and ongoing recovery are:: agreeable to [facility] CMS Medicare.gov Compare Post Acute Care list provided to:: Patient Represenative (must comment) Choice offered to / list presented to : Adult Children [place] ownership interest in [facility].provided to:: Adult Children    Discharge Placement               Patient to be transferred to facility by: Staff Name of family member notified: [name] Patient and family notified of of transfer: 02/02/24  Discharge Plan and Services Additional resources added to the After Visit Summary for         Social Drivers of Health (SDOH) Interventions SDOH Screenings   Food Insecurity: Patient Unable To Answer (01/27/2024)  Housing: Unknown (01/27/2024)  Transportation Needs: Patient Unable To Answer (01/27/2024)  Utilities: Patient Unable To Answer (01/27/2024)  Depression (PHQ2-9): Low Risk  (08/17/2022)  Social Connections: Patient Unable To Answer (01/27/2024)  Tobacco Use: High Risk (01/27/2024)     Readmission Risk Interventions    08/12/2022    1:48 PM 03/06/2022    2:29 PM  Readmission Risk Prevention Plan  Transportation Screening Complete Complete  Home Care Screening  Complete  Medication Review (RN CM)  Complete  HRI or Home Care Consult Complete   Social Work Consult for Recovery Care Planning/Counseling Complete   Palliative Care Screening Not  Applicable   Medication Review Oceanographer) Complete

## 2024-02-02 NOTE — Progress Notes (Signed)
 [facility] and gave report to nurse Ola. Will transport after he has eaten lunch.

## 2024-02-03 ENCOUNTER — Non-Acute Institutional Stay (SKILLED_NURSING_FACILITY): Payer: Self-pay | Admitting: Internal Medicine

## 2024-02-03 ENCOUNTER — Encounter: Payer: Self-pay | Admitting: Internal Medicine

## 2024-02-03 DIAGNOSIS — J9601 Acute respiratory failure with hypoxia: Secondary | ICD-10-CM

## 2024-02-03 DIAGNOSIS — R4189 Other symptoms and signs involving cognitive functions and awareness: Secondary | ICD-10-CM | POA: Diagnosis not present

## 2024-02-03 DIAGNOSIS — N3000 Acute cystitis without hematuria: Secondary | ICD-10-CM

## 2024-02-03 DIAGNOSIS — E041 Nontoxic single thyroid nodule: Secondary | ICD-10-CM | POA: Diagnosis not present

## 2024-02-03 DIAGNOSIS — R29818 Other symptoms and signs involving the nervous system: Secondary | ICD-10-CM

## 2024-02-03 NOTE — Patient Instructions (Signed)
 See assessment and plan under each diagnosis in the problem list and acutely for this visit

## 2024-02-03 NOTE — Assessment & Plan Note (Addendum)
 02/03/2024 he has no comprehension of findings during his hospitalization 8/28 - 02/02/2024.  He seems to be exhibiting some confabulation.  B12 was low normal @ 412.
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
## 2024-02-03 NOTE — Assessment & Plan Note (Signed)
 Current O2 sats are excellent on supplemental oxygen.  Chest imaging suggest significant right sided disease which may be indicative of recurrent aspiration as he describes significant dysphagia.  Pulmonary follow-up after discharge from SNF indicated.

## 2024-02-03 NOTE — Assessment & Plan Note (Signed)
 Endocrinology referral indicated.

## 2024-02-03 NOTE — Assessment & Plan Note (Signed)
 He does describe recurrent dysuria.  Urology outpatient follow-up is indicated.

## 2024-02-04 ENCOUNTER — Non-Acute Institutional Stay (SKILLED_NURSING_FACILITY): Payer: Self-pay | Admitting: Adult Health

## 2024-02-04 ENCOUNTER — Encounter: Payer: Self-pay | Admitting: Adult Health

## 2024-02-04 DIAGNOSIS — E1169 Type 2 diabetes mellitus with other specified complication: Secondary | ICD-10-CM

## 2024-02-04 DIAGNOSIS — N3000 Acute cystitis without hematuria: Secondary | ICD-10-CM | POA: Diagnosis not present

## 2024-02-04 DIAGNOSIS — J9601 Acute respiratory failure with hypoxia: Secondary | ICD-10-CM | POA: Diagnosis not present

## 2024-02-04 DIAGNOSIS — N401 Enlarged prostate with lower urinary tract symptoms: Secondary | ICD-10-CM

## 2024-02-04 DIAGNOSIS — R4189 Other symptoms and signs involving cognitive functions and awareness: Secondary | ICD-10-CM

## 2024-02-04 DIAGNOSIS — I708 Atherosclerosis of other arteries: Secondary | ICD-10-CM

## 2024-02-04 DIAGNOSIS — R29818 Other symptoms and signs involving the nervous system: Secondary | ICD-10-CM

## 2024-02-04 DIAGNOSIS — E1159 Type 2 diabetes mellitus with other circulatory complications: Secondary | ICD-10-CM | POA: Diagnosis not present

## 2024-02-04 DIAGNOSIS — E041 Nontoxic single thyroid nodule: Secondary | ICD-10-CM | POA: Diagnosis not present

## 2024-02-04 DIAGNOSIS — N183 Chronic kidney disease, stage 3 unspecified: Secondary | ICD-10-CM

## 2024-02-04 DIAGNOSIS — E785 Hyperlipidemia, unspecified: Secondary | ICD-10-CM

## 2024-02-04 DIAGNOSIS — I700 Atherosclerosis of aorta: Secondary | ICD-10-CM | POA: Diagnosis not present

## 2024-02-04 DIAGNOSIS — I129 Hypertensive chronic kidney disease with stage 1 through stage 4 chronic kidney disease, or unspecified chronic kidney disease: Secondary | ICD-10-CM

## 2024-02-04 DIAGNOSIS — E876 Hypokalemia: Secondary | ICD-10-CM

## 2024-02-04 DIAGNOSIS — K219 Gastro-esophageal reflux disease without esophagitis: Secondary | ICD-10-CM

## 2024-02-04 DIAGNOSIS — I5032 Chronic diastolic (congestive) heart failure: Secondary | ICD-10-CM | POA: Diagnosis not present

## 2024-02-04 DIAGNOSIS — R338 Other retention of urine: Secondary | ICD-10-CM

## 2024-02-04 NOTE — Progress Notes (Unsigned)
 Location:  [facility]   Place of Service:   SNF    CODE STATUS: Full Code  Allergies  Allergen Reactions   Other Other (See Comments)    All sleep aids Makes him crazy, hallucinations, vivid dreams   Penicillins Other (See Comments)    Unknown  Childhood allergy Disorder of immune function    Chief Complaint  Patient presents with   Hospitalization Follow-up    HPI:  He is a 89 year old man who has been hospitalized from 01-27-24 through 02-02-24. His past medical history includes: ckd stage 3a; complete heart block status post pacemaker; cad; bph; gerd. He presented to the ED with confusion and a mechanical fall. He was currently being treated for an uti. He was treated for acute on chronic renal failure and providencia rettgeri. He will need another 5 days of cipro . He is here for short term rehab with his goal to return back home. He will continue to be followed for his chronic illnesses including: type 2 diabetes mellitus with other circulatory complications:  Benign hypertension with stage 3 chronic kidney disease:  Aorto-iliac atherosclerosis   Past Medical History:  Diagnosis Date   Arthritis    CAD (coronary artery disease)    a.  90% LAD stenosis in 1995 treated with PTCA;  b. 09/2009 CABG x 4: LIMA->LAD, VG->Diag, VG->OM, VG->PDA;  c. 11/2011 Cath: 3VD, 4/4 patent grafts, EF 70%.   Chronic constipation    Colonic polyp    Complete heart block (HCC)    a. 11/2011 s/p MDT Adapta L ADDRL 1 Ser # [id number] H.   Complication of anesthesia    PROBLEMS VOIDING AFTER PREVIOUS SURGERIES   Diabetes mellitus without complication (HCC)    NOT ON ANY DIABETIC MEDICATIONS   Fasting hyperglycemia    GERD (gastroesophageal reflux disease)    Hyperlipidemia    Hypertension    Pacemaker 11/2011   DR.  G.TAYLOR AND DR. DOROTHA RAKERS   Pseudophakia of both eyes    Seizures (HCC)    a. prior to diagnosis of heart block and syncope, this was in the differential and pt was  briefly on Keppra , started by ER MD.   Syncope    a. 11/2011 18 second pauses noted on Event Monitor assoc w/ syncope   Trauma 1952   Abdominal and chest in 1952; multiple injuries including pelvic fracture, rib fractures and ruptured bladder   Urinary retention    PT HAS INDWELLING FOLEY CATHETER    Past Surgical History:  Procedure Laterality Date   BIOPSY  07/05/2019   Procedure: BIOPSY;  Surgeon: Golda Claudis PENNER, MD;  [facility];  Service: Endoscopy;;  gastric   BLADDER SURGERY  1950'S   CATARACT EXTRACTION W/PHACO Right 11/22/2012   Procedure: CATARACT EXTRACTION PHACO AND INTRAOCULAR LENS PLACEMENT (IOC);  Surgeon: Oneil T. Roz, MD;  Location: [facility];  Service: Ophthalmology;  Laterality: Right;  CDE 10.27   CATARACT EXTRACTION W/PHACO Left 12/20/2012   Procedure: CATARACT EXTRACTION PHACO AND INTRAOCULAR LENS PLACEMENT (IOC);  Surgeon: Oneil T. Roz, MD;  Location: [facility];  Service: Ophthalmology;  Laterality: Left;  CDE:6.67   COLONOSCOPY W/ POLYPECTOMY     CORONARY ARTERY BYPASS GRAFT  09/2009   4 vessels   ESOPHAGEAL DILATION  07/05/2019   Procedure: ESOPHAGEAL DILATION;  Surgeon: Golda Claudis PENNER, MD;  [facility];  Service: Endoscopy;;   ESOPHAGOGASTRODUODENOSCOPY (EGD) WITH PROPOFOL  N/A 07/05/2019   Procedure: ESOPHAGOGASTRODUODENOSCOPY (EGD) WITH PROPOFOL ;  Surgeon: Golda Claudis PENNER, MD;  [facility];  Service: Endoscopy;  Laterality: N/A;  110-office notified pt new arrival time 10:45am   FEMORAL HERNIA REPAIR     INSERTION OF SUPRAPUBIC CATHETER  04/04/2012   Procedure: INSERTION OF SUPRAPUBIC CATHETER;  Surgeon: Oneil JAYSON Rafter, MD;  Location: [facility];  Service: Urology;  Laterality: N/A;   LEAD REVISION N/A 12/30/2011   Procedure: LEAD REVISION;  Surgeon: Danelle LELON Birmingham, MD;  Location: [facility] CATH LAB;  Service: Cardiovascular;  Laterality: N/A;   LEFT HEART CATHETERIZATION WITH CORONARY/GRAFT ANGIOGRAM  12/29/2011   Procedure: LEFT HEART  CATHETERIZATION WITH EL BILE;  Surgeon: Ozell Fell, MD;  Location: [facility] CATH LAB;  Service: Cardiovascular;;   PACEMAKER PLACEMENT     TEMPORARY PACEMAKER INSERTION N/A 12/29/2011   Procedure: TEMPORARY PACEMAKER INSERTION;  Surgeon: Ozell Fell, MD;  Location: [facility] CATH LAB;  Service: Cardiovascular;  Laterality: N/A;   TRANSURETHRAL PROSTATECTOMY WITH GYRUS INSTRUMENTS  04/04/2012   Procedure: TRANSURETHRAL PROSTATECTOMY WITH GYRUS INSTRUMENTS;  Surgeon: Mark C Ottelin, MD;  Location: [facility];  Service: Urology;  Laterality: N/A;  TURP with Gyrus and Suprapubic Tube Placement   YAG LASER APPLICATION Left 06/25/2015   Procedure: YAG LASER APPLICATION;  Surgeon: Oneil Platts, MD;  Location: [facility];  Service: Ophthalmology;  Laterality: Left;    Social History   Socioeconomic History   Marital status: Married    Spouse name: Not on file   Number of children: Not on file   Years of education: Not on file   Highest education level: Not on file  Occupational History   Occupation: Retired  Tobacco Use   Smoking status: Never   Smokeless tobacco: Current    Types: Chew  Vaping Use   Vaping status: Never Used  Substance and Sexual Activity   Alcohol use: No   Drug use: No   Sexual activity: Yes  Other Topics Concern   Not on file  Social History Narrative   Not on file   Social Drivers of Health   Financial Resource Strain: Not on file  Food Insecurity: Patient Unable To Answer (01/27/2024)   Hunger Vital Sign    Worried About Running Out of Food in the Last Year: Patient unable to answer    Ran Out of Food in the Last Year: Patient unable to answer  Transportation Needs: Patient Unable To Answer (01/27/2024)   PRAPARE - Transportation    Lack of Transportation (Medical): Patient unable to answer    Lack of Transportation (Non-Medical): Patient unable to answer  Physical Activity: Not on file  Stress: Not on file  Social Connections: Patient Unable To Answer  (01/27/2024)   Social Connection and Isolation Panel    Frequency of Communication with Friends and Family: Patient unable to answer    Frequency of Social Gatherings with Friends and Family: Patient unable to answer    Attends Religious Services: Patient unable to answer    Active Member of Clubs or Organizations: Patient unable to answer    Attends Banker Meetings: Patient unable to answer    Marital Status: Patient unable to answer  Intimate Partner Violence: Patient Unable To Answer (01/27/2024)   Humiliation, Afraid, Rape, and Kick questionnaire    Fear of Current or Ex-Partner: Patient unable to answer    Emotionally Abused: Patient unable to answer    Physically Abused: Patient unable to answer    Sexually Abused: Patient unable to answer   Family History  Problem Relation Age of Onset   Heart block Mother    Heart murmur Mother    Aneurysm Father    Coronary artery disease Brother        x3   Lung cancer Brother       VITAL SIGNS BP 138/68   Pulse 86   Temp 97.9 F (36.6 C)   Resp 20   Ht 5' 3 (1.6 m)   Wt 179 lb (81.2 kg)   SpO2 96%   BMI 31.71 kg/m   Outpatient Encounter Medications as of 02/04/2024  Medication Sig   acetaminophen  (TYLENOL ) 325 MG tablet Take 2 tablets (650 mg total) by mouth every 6 (six) hours as needed for mild pain (pain score 1-3) or fever (or Fever >/= 101).   albuterol  (VENTOLIN  HFA) 108 (90 Base) MCG/ACT inhaler Inhale 2 puffs into the lungs every 6 (six) hours as needed for wheezing or shortness of breath.   ciprofloxacin  (CIPRO ) 500 MG tablet Take 1 tablet (500 mg total) by mouth 2 (two) times daily for 5 days.    furosemide  (LASIX ) 40 MG tablet Take 1 tablet (40 mg total) by mouth daily.   guaiFENesin -dextromethorphan  (ROBITUSSIN DM) 100-10 MG/5ML syrup Take 10 mLs by mouth every 4 (four) hours as needed for cough.   ipratropium-albuterol  (DUONEB) 0.5-2.5 (3) MG/3ML SOLN Take 3 mLs by nebulization every 6 (six) hours as  needed.   methylPREDNISolone  (MEDROL ) 4 MG tablet Take 4 mg by mouth daily. 12mg ; oral Special Instructions: Administer 12mg  dose - one 8mg  and one 4mg  tablet on 02/04/24 Once - One Time 10:00 AM    metoprolol  tartrate (LOPRESSOR ) 50 MG tablet Take 50 mg by mouth 2 (two) times daily. (Patient not taking: Reported on 02/08/2024)   pantoprazole  (PROTONIX ) 20 MG tablet TAKE ONE TABLET (20MG  TOTAL) BY MOUTH TWO TIMES DAILY BEFORE A MEAL   phenazopyridine  (PYRIDIUM ) 100 MG tablet Take 1 tablet (100 mg total) by mouth 2 (two) times daily.    potassium chloride  SA (KLOR-CON  M) 20 MEQ tablet TAKE (1) TABLET BY MOUTH ONCE DAILY TO REPLACE POTASSIUM   Probiotic Product (RISA-BID PROBIOTIC) TABS Take 250 tablets by mouth 3 (three) times daily.  Administer with meals three times daily for 10 days Three Times A Day 08:30 AM, 12:30 PM, 05:30 PM   simvastatin  (ZOCOR ) 20 MG tablet Take 1 tablet (20 mg total) by mouth daily.   SURE COMFORT PEN NEEDLES 31G X 5 MM MISC USE AS DIRECTED UP TO 3ITIMES DAILY.   tamsulosin  (FLOMAX ) 0.4 MG CAPS capsule Take 1 capsule (0.4 mg total) by mouth daily after supper.   [DISCONTINUED] insulin  aspart (NOVOLOG ) 100 UNIT/ML injection Inject 0-5 Units into the skin at bedtime. CBG 70 - 120: 0 units CBG 121 - 150: 0 units CBG 151 - 200: 0 units CBG 201 - 250: 2 units CBG 251 - 300: 3 units CBG 301 - 350: 4 units CBG 351 - 400: 5 units CBG > 400: call MD and obtain STAT lab verification (Patient not taking: Reported on 02/08/2024)   [DISCONTINUED] methylPREDNISolone  (MEDROL  DOSEPAK) 4 MG TBPK tablet Medrol  Dosepak take as instructed (Patient not taking: Reported on 02/08/2024)   [DISCONTINUED] lactobacillus acidophilus & bulgar (LACTINEX) chewable tablet Chew 1 tablet by mouth 3 (three) times daily with meals for 10 days. (Patient not taking: Reported on 02/08/2024)   [DISCONTINUED] metoprolol  tartrate (LOPRESSOR ) 50 MG tablet TAKE (1) TABLET BY MOUTH TWICE DAILY.   No facility-administered  encounter medications on file  as of 02/04/2024.     SIGNIFICANT DIAGNOSTIC EXAMS  TODAY  01-27-24: wbc 10.3; hgb 12.1; hct 36.0; mcv 93.5 plt 148; glucose 140; bun 37; creat 1.93; k+ 4.0 na++ 136 ca 10.1; gfr 33 protein 6.9 albumin 4.0; tsh 0.829; vitamin B12: 412; hgb A1c 6.7; urine culture: 20,000  Review of Systems  Constitutional:  Negative for malaise/fatigue.  Respiratory:  Negative for cough and shortness of breath.   Cardiovascular:  Negative for chest pain, palpitations and leg swelling.  Gastrointestinal:  Negative for abdominal pain, constipation and heartburn.  Musculoskeletal:  Negative for back pain, joint pain and myalgias.  Skin: Negative.   Neurological:  Negative for dizziness.  Psychiatric/Behavioral:  The patient is not nervous/anxious.     Physical Exam Constitutional:      General: He is not in acute distress.    Appearance: He is well-developed. He is not diaphoretic.  Neck:     Thyroid: No thyromegaly.  Cardiovascular:     Rate and Rhythm: Normal rate and regular rhythm.     Heart sounds: Normal heart sounds.  Pulmonary:     Effort: Pulmonary effort is normal. No respiratory distress.     Breath sounds: Normal breath sounds.  Abdominal:     General: Bowel sounds are normal. There is no distension.     Palpations: Abdomen is soft.     Tenderness: There is no abdominal tenderness.  Musculoskeletal:        General: Normal range of motion.     Cervical back: Neck supple.     Right lower leg: No edema.     Left lower leg: No edema.  Lymphadenopathy:     Cervical: No cervical adenopathy.  Skin:    General: Skin is warm and dry.  Neurological:     Mental Status: He is alert. Mental status is at baseline.  Psychiatric:        Mood and Affect: Mood normal.      ASSESSMENT/ PLAN:  TODAY  Acute cystitis without hematuria: will complete cipro  500 mg twice daily for 5 days  2. Type 2 diabetes mellitus with other circulatory complications: will  continue novolog  5 units with meals  3.  Benign hypertension with stage 3 chronic kidney disease: b/p 138/68 will continue lopressor  50 mg twice daily   4. Aorto-iliac atherosclerosis (ct 01-27-24) is on statin  5. Chronic diastolic (congestive) heart failure: will continue lasix  40 mg daily  6. Acute respiratory failure with hypoxia: will complete medrol  taper; has duoneb and albuterol  2 puffs every 6 hours as needed  7. Gastroesophageal reflux disease without esophagitis: will continue protonix  20 mg twice daily   8. Thyroid nodule greater than or equal to 1.5 cm in diameter incidentally found on imaging study: will need to be seen by endocrinology on an outpatient basis  9. Hyperlipidemia associated with type 2 diabetes mellitus: will continue zocor  20 mg daily   10. Neurocognitive deficits   11. Benign prostatic hyperplasia with urine retention: will continue flomax  daily   12. Hypokalemia: will continue k+ 20 meq daily    Barnie Seip NP [facility] Adult [facility]   call [phone]

## 2024-02-08 ENCOUNTER — Encounter: Payer: Self-pay | Admitting: Adult Health

## 2024-02-08 ENCOUNTER — Non-Acute Institutional Stay: Payer: Self-pay | Admitting: Adult Health

## 2024-02-08 NOTE — Progress Notes (Unsigned)
 Location:  [facility] Room Number: 160 P Place of Service:  SNF (31)   CODE STATUS: Full Code   Allergies  Allergen Reactions   Other Other (See Comments)    All sleep aids Makes him crazy, hallucinations, vivid dreams   Penicillins Other (See Comments)    Unknown  Childhood allergy Disorder of immune function    No chief complaint on file.   HPI:    Past Medical History:  Diagnosis Date   Arthritis    CAD (coronary artery disease)    a.  90% LAD stenosis in 1995 treated with PTCA;  b. 09/2009 CABG x 4: LIMA->LAD, VG->Diag, VG->OM, VG->PDA;  c. 11/2011 Cath: 3VD, 4/4 patent grafts, EF 70%.   Chronic constipation    Colonic polyp    Complete heart block (HCC)    a. 11/2011 s/p MDT Adapta L ADDRL 1 Ser # [id number] H.   Complication of anesthesia    PROBLEMS VOIDING AFTER PREVIOUS SURGERIES   Diabetes mellitus without complication (HCC)    NOT ON ANY DIABETIC MEDICATIONS   Fasting hyperglycemia    GERD (gastroesophageal reflux disease)    Hyperlipidemia    Hypertension    Pacemaker 11/2011   DR.  G.TAYLOR AND DR. DOROTHA RAKERS   Pseudophakia of both eyes    Seizures (HCC)    a. prior to diagnosis of heart block and syncope, this was in the differential and pt was briefly on Keppra , started by ER MD.   Syncope    a. 11/2011 18 second pauses noted on Event Monitor assoc w/ syncope   Trauma 1952   Abdominal and chest in 1952; multiple injuries including pelvic fracture, rib fractures and ruptured bladder   Urinary retention    PT HAS INDWELLING FOLEY CATHETER    Past Surgical History:  Procedure Laterality Date   BIOPSY  07/05/2019   Procedure: BIOPSY;  Surgeon: Golda Claudis PENNER, MD;  [facility];  Service: Endoscopy;;  gastric   BLADDER SURGERY  1950'S   CATARACT EXTRACTION W/PHACO Right 11/22/2012   Procedure: CATARACT EXTRACTION PHACO AND INTRAOCULAR LENS PLACEMENT (IOC);  Surgeon: Oneil T. Roz, MD;  Location: [facility];  Service:  Ophthalmology;  Laterality: Right;  CDE 10.27   CATARACT EXTRACTION W/PHACO Left 12/20/2012   Procedure: CATARACT EXTRACTION PHACO AND INTRAOCULAR LENS PLACEMENT (IOC);  Surgeon: Oneil T. Roz, MD;  Location: [facility];  Service: Ophthalmology;  Laterality: Left;  CDE:6.67   COLONOSCOPY W/ POLYPECTOMY     CORONARY ARTERY BYPASS GRAFT  09/2009   4 vessels   ESOPHAGEAL DILATION  07/05/2019   Procedure: ESOPHAGEAL DILATION;  Surgeon: Golda Claudis PENNER, MD;  [facility];  Service: Endoscopy;;   ESOPHAGOGASTRODUODENOSCOPY (EGD) WITH PROPOFOL  N/A 07/05/2019   Procedure: ESOPHAGOGASTRODUODENOSCOPY (EGD) WITH PROPOFOL ;  Surgeon: Golda Claudis PENNER, MD;  [facility];  Service: Endoscopy;  Laterality: N/A;  110-office notified pt new arrival time 10:45am   FEMORAL HERNIA REPAIR     INSERTION OF SUPRAPUBIC CATHETER  04/04/2012   Procedure: INSERTION OF SUPRAPUBIC CATHETER;  Surgeon: Oneil JAYSON Rafter, MD;  Location: [facility];  Service: Urology;  Laterality: N/A;   LEAD REVISION N/A 12/30/2011   Procedure: LEAD REVISION;  Surgeon: Danelle LELON Birmingham, MD;  Location: [facility] CATH LAB;  Service: Cardiovascular;  Laterality: N/A;   LEFT HEART CATHETERIZATION WITH CORONARY/GRAFT ANGIOGRAM  12/29/2011   Procedure: LEFT HEART CATHETERIZATION WITH EL BILE;  Surgeon: Ozell Fell, MD;  Location: [facility] CATH  LAB;  Service: Cardiovascular;;   PACEMAKER PLACEMENT     TEMPORARY PACEMAKER INSERTION N/A 12/29/2011   Procedure: TEMPORARY PACEMAKER INSERTION;  Surgeon: Ozell Fell, MD;  Location: [facility] CATH LAB;  Service: Cardiovascular;  Laterality: N/A;   TRANSURETHRAL PROSTATECTOMY WITH GYRUS INSTRUMENTS  04/04/2012   Procedure: TRANSURETHRAL PROSTATECTOMY WITH GYRUS INSTRUMENTS;  Surgeon: Mark C Ottelin, MD;  Location: [facility];  Service: Urology;  Laterality: N/A;  TURP with Gyrus and Suprapubic Tube Placement   YAG LASER APPLICATION Left 06/25/2015   Procedure: YAG LASER APPLICATION;  Surgeon: Oneil Platts,  MD;  Location: [facility];  Service: Ophthalmology;  Laterality: Left;    Social History   Socioeconomic History   Marital status: Married    Spouse name: Not on file   Number of children: Not on file   Years of education: Not on file   Highest education level: Not on file  Occupational History   Occupation: Retired  Tobacco Use   Smoking status: Never   Smokeless tobacco: Current    Types: Chew  Vaping Use   Vaping status: Never Used  Substance and Sexual Activity   Alcohol use: No   Drug use: No   Sexual activity: Yes  Other Topics Concern   Not on file  Social History Narrative   Not on file   Social Drivers of Health   Financial Resource Strain: Not on file  Food Insecurity: Patient Unable To Answer (01/27/2024)   Hunger Vital Sign    Worried About Running Out of Food in the Last Year: Patient unable to answer    Ran Out of Food in the Last Year: Patient unable to answer  Transportation Needs: Patient Unable To Answer (01/27/2024)   PRAPARE - Transportation    Lack of Transportation (Medical): Patient unable to answer    Lack of Transportation (Non-Medical): Patient unable to answer  Physical Activity: Not on file  Stress: Not on file  Social Connections: Patient Unable To Answer (01/27/2024)   Social Connection and Isolation Panel    Frequency of Communication with Friends and Family: Patient unable to answer    Frequency of Social Gatherings with Friends and Family: Patient unable to answer    Attends Religious Services: Patient unable to answer    Active Member of Clubs or Organizations: Patient unable to answer    Attends Banker Meetings: Patient unable to answer    Marital Status: Patient unable to answer  Intimate Partner Violence: Patient Unable To Answer (01/27/2024)   Humiliation, Afraid, Rape, and Kick questionnaire    Fear of Current or Ex-Partner: Patient unable to answer    Emotionally Abused: Patient unable to answer    Physically Abused:  Patient unable to answer    Sexually Abused: Patient unable to answer   Family History  Problem Relation Age of Onset   Heart block Mother    Heart murmur Mother    Aneurysm Father    Coronary artery disease Brother        x3   Lung cancer Brother       VITAL SIGNS BP 131/74   Pulse 70   Temp 97.6 F (36.4 C)   Resp 20   Ht 5' 3 (1.6 m)   Wt 179 lb (81.2 kg)   SpO2 98%   BMI 31.71 kg/m   Outpatient Encounter Medications as of 02/08/2024  Medication Sig   acetaminophen  (TYLENOL ) 325 MG tablet Take 2 tablets (650 mg total) by mouth every 6 (six)  hours as needed for mild pain (pain score 1-3) or fever (or Fever >/= 101).   albuterol  (VENTOLIN  HFA) 108 (90 Base) MCG/ACT inhaler Inhale 2 puffs into the lungs every 6 (six) hours as needed for wheezing or shortness of breath.   furosemide  (LASIX ) 40 MG tablet Take 1 tablet (40 mg total) by mouth daily.   guaiFENesin -dextromethorphan  (ROBITUSSIN DM) 100-10 MG/5ML syrup Take 10 mLs by mouth every 4 (four) hours as needed for cough.   insulin  aspart (NOVOLOG  FLEXPEN) 100 UNIT/ML FlexPen Inject 5 Units into the skin 3 (three) times daily before meals.   ipratropium-albuterol  (DUONEB) 0.5-2.5 (3) MG/3ML SOLN Take 3 mLs by nebulization every 6 (six) hours as needed.   metoprolol  tartrate (LOPRESSOR ) 50 MG tablet TAKE (1) TABLET BY MOUTH TWICE DAILY.   OXYGEN Inhale 2 L into the lungs as directed.  to keep sats >90% Special Instructions: Document O2 sat qshift. Every Shift; Day, Evening, Night   pantoprazole  (PROTONIX ) 20 MG tablet TAKE ONE TABLET (20MG  TOTAL) BY MOUTH TWO TIMES DAILY BEFORE A MEAL   phenazopyridine  (AZO URINARY PAIN RELIEF) 95 MG tablet Take 95 mg by mouth 2 (two) times daily.   potassium chloride  SA (KLOR-CON  M) 20 MEQ tablet TAKE (1) TABLET BY MOUTH ONCE DAILY TO REPLACE POTASSIUM   Probiotic Product (RISA-BID PROBIOTIC) TABS Take 250 tablets by mouth 3 (three) times daily.  Administer with meals three times daily for 10  days Three Times A Day 08:30 AM, 12:30 PM, 05:30 PM   simvastatin  (ZOCOR ) 20 MG tablet Take 1 tablet (20 mg total) by mouth daily.   SURE COMFORT PEN NEEDLES 31G X 5 MM MISC USE AS DIRECTED UP TO 3ITIMES DAILY.   tamsulosin  (FLOMAX ) 0.4 MG CAPS capsule Take 1 capsule (0.4 mg total) by mouth daily after supper.   ciprofloxacin  (CIPRO ) 500 MG tablet Take 1 tablet (500 mg total) by mouth 2 (two) times daily for 5 days. (Patient not taking: Reported on 02/08/2024)   insulin  aspart (NOVOLOG ) 100 UNIT/ML injection Inject 0-5 Units into the skin at bedtime. CBG 70 - 120: 0 units CBG 121 - 150: 0 units CBG 151 - 200: 0 units CBG 201 - 250: 2 units CBG 251 - 300: 3 units CBG 301 - 350: 4 units CBG 351 - 400: 5 units CBG > 400: call MD and obtain STAT lab verification (Patient not taking: Reported on 02/08/2024)   lactobacillus acidophilus & bulgar (LACTINEX) chewable tablet Chew 1 tablet by mouth 3 (three) times daily with meals for 10 days. (Patient not taking: Reported on 02/08/2024)   methylPREDNISolone  (MEDROL  DOSEPAK) 4 MG TBPK tablet Medrol  Dosepak take as instructed (Patient not taking: Reported on 02/08/2024)   methylPREDNISolone  (MEDROL ) 4 MG tablet Take 4 mg by mouth daily. 12mg ; oral Special Instructions: Administer 12mg  dose - one 8mg  and one 4mg  tablet on 02/04/24 Once - One Time 10:00 AM (Patient not taking: Reported on 02/08/2024)   metoprolol  tartrate (LOPRESSOR ) 50 MG tablet Take 50 mg by mouth 2 (two) times daily. (Patient not taking: Reported on 02/08/2024)   phenazopyridine  (PYRIDIUM ) 100 MG tablet Take 1 tablet (100 mg total) by mouth 2 (two) times daily. (Patient not taking: Reported on 02/08/2024)   No facility-administered encounter medications on file as of 02/08/2024.     SIGNIFICANT DIAGNOSTIC EXAMS       ASSESSMENT/ PLAN:     Barnie Seip NP [facility] Adult [facility]  Contact [phone] Monday through Friday 8am- 5pm  After hours call [phone]

## 2024-02-09 ENCOUNTER — Other Ambulatory Visit (HOSPITAL_COMMUNITY)
Admission: RE | Admit: 2024-02-09 | Discharge: 2024-02-09 | Disposition: A | Discharge status: Home / Self Care | Source: Skilled Nursing Facility | Attending: Adult Health | Admitting: Adult Health

## 2024-02-09 DIAGNOSIS — I152 Hypertension secondary to endocrine disorders: Secondary | ICD-10-CM | POA: Insufficient documentation

## 2024-02-09 LAB — BASIC METABOLIC PANEL WITH GFR
Anion gap: 11 (ref 5–15)
BUN: 33 mg/dL — ABNORMAL HIGH (ref 8–23)
CO2: 29 mmol/L (ref 22–32)
Calcium: 9.5 mg/dL (ref 8.9–10.3)
Chloride: 94 mmol/L — ABNORMAL LOW (ref 98–111)
Creatinine, Ser: 1.39 mg/dL — ABNORMAL HIGH (ref 0.61–1.24)
GFR, Estimated: 48 mL/min — ABNORMAL LOW (ref >60)
Glucose, Bld: 141 mg/dL — ABNORMAL HIGH (ref 70–99)
Potassium: 4.1 mmol/L (ref 3.5–5.1)
Sodium: 134 mmol/L — ABNORMAL LOW (ref 135–145)

## 2024-02-10 DIAGNOSIS — I5032 Chronic diastolic (congestive) heart failure: Secondary | ICD-10-CM | POA: Insufficient documentation

## 2024-02-10 DIAGNOSIS — I129 Hypertensive chronic kidney disease with stage 1 through stage 4 chronic kidney disease, or unspecified chronic kidney disease: Secondary | ICD-10-CM | POA: Insufficient documentation

## 2024-02-10 NOTE — Progress Notes (Signed)
 This encounter was created in error - please disregard.

## 2024-02-15 ENCOUNTER — Telehealth: Payer: Self-pay | Admitting: Internal Medicine

## 2024-02-15 ENCOUNTER — Non-Acute Institutional Stay (SKILLED_NURSING_FACILITY): Payer: Self-pay | Admitting: Adult Health

## 2024-02-15 ENCOUNTER — Encounter: Payer: Self-pay | Admitting: Adult Health

## 2024-02-15 DIAGNOSIS — R29818 Other symptoms and signs involving the nervous system: Secondary | ICD-10-CM | POA: Diagnosis not present

## 2024-02-15 DIAGNOSIS — I5032 Chronic diastolic (congestive) heart failure: Secondary | ICD-10-CM

## 2024-02-15 DIAGNOSIS — E441 Mild protein-calorie malnutrition: Secondary | ICD-10-CM | POA: Diagnosis not present

## 2024-02-15 DIAGNOSIS — Z79899 Other long term (current) drug therapy: Secondary | ICD-10-CM

## 2024-02-15 DIAGNOSIS — I110 Hypertensive heart disease with heart failure: Secondary | ICD-10-CM

## 2024-02-15 DIAGNOSIS — R4189 Other symptoms and signs involving cognitive functions and awareness: Secondary | ICD-10-CM | POA: Diagnosis not present

## 2024-02-15 MED ORDER — FUROSEMIDE 40 MG PO TABS
60.0000 mg | ORAL_TABLET | Freq: Every day | ORAL | 1 refills | Status: DC
Start: 2024-02-15 — End: 2024-02-16

## 2024-02-15 NOTE — Telephone Encounter (Signed)
 Spoke to DIL who verbalized understanding. DIL stated that she will start pt on Lasix  60 mg daily once pt is back home. DIL agreeable to pt having lab work in 2 weeks at [facility]. DIL had no further questions or concerns at this time.

## 2024-02-15 NOTE — Telephone Encounter (Signed)
 Per last OV note with gen cardiology provider on 06/11/23:  1. Chronic HFpEF - Recent echocardiogram in 01/2023 showed a preserved EF of 55 to 60%. Volume status has significantly improved since his last examination. Will continue current medical therapy with Lasix  60 mg daily. He does weigh himself daily and I encouraged him to reach out if he has an acute weight gain of more than 3 pounds overnight or 5 pounds in 1 week. They were encouraged to continue to limit sodium intake. Would not use an SGLT2 inhibitor given his history of frequent UTI's.   Spoke to DIL who stated that patient is currently in the [facility] and is c/o swelling in his legs/feet. DIL stated that pt has gained 22 lb since 8/27. DIL stated that pt is still currently taking Lasix  60 mg once daily. DIL stated that the [facility] is not limiting pt's sodium intake- was told that all patients at facility are on the same diet. Pt's weight this morning was 177.8 lb. DIL stated that pt will gain 4 lbs one day, lose 2 the next, etc. Pt is due for D/C on Thursday 9/18 and they are questioning next steps with swelling.   Please advise.

## 2024-02-15 NOTE — Progress Notes (Signed)
 Location:  [facility] Room Number: 160/P Place of Service:  SNF (31)   CODE STATUS: Full Code  Allergies  Allergen Reactions   Other Other (See Comments)    All sleep aids Makes him crazy, hallucinations, vivid dreams   Penicillins Other (See Comments)    Unknown  Childhood allergy Disorder of immune function    Chief Complaint  Patient presents with   Discharge Note    HPI:  He is being discharged to home with home health for pt/ot. He does not need any dme; will need his prescriptions written and will need to follow up with his medical provider. He was hospitalized for acute chf requiring IV diuresis. He is now taking lasix  60 mg daily. He was admitted to this facility for short term rehab. Therapy: ambulate 125 feet with rolling walker; upper body supervision; lower body min to mod assist; brp: contact guard assist. SLUMS 17/30.   Past Medical History:  Diagnosis Date   Arthritis    CAD (coronary artery disease)    a.  90% LAD stenosis in 1995 treated with PTCA;  b. 09/2009 CABG x 4: LIMA->LAD, VG->Diag, VG->OM, VG->PDA;  c. 11/2011 Cath: 3VD, 4/4 patent grafts, EF 70%.   Chronic constipation    Colonic polyp    Complete heart block (HCC)    a. 11/2011 s/p MDT Adapta L ADDRL 1 Ser # [id number] H.   Complication of anesthesia    PROBLEMS VOIDING AFTER PREVIOUS SURGERIES   Diabetes mellitus without complication (HCC)    NOT ON ANY DIABETIC MEDICATIONS   Fasting hyperglycemia    GERD (gastroesophageal reflux disease)    Hyperlipidemia    Hypertension    Pacemaker 11/2011   DR.  G.TAYLOR AND DR. DOROTHA RAKERS   Pseudophakia of both eyes    Seizures (HCC)    a. prior to diagnosis of heart block and syncope, this was in the differential and pt was briefly on Keppra , started by ER MD.   Syncope    a. 11/2011 18 second pauses noted on Event Monitor assoc w/ syncope   Trauma 1952   Abdominal and chest in 1952; multiple injuries including pelvic  fracture, rib fractures and ruptured bladder   Urinary retention    PT HAS INDWELLING FOLEY CATHETER    Past Surgical History:  Procedure Laterality Date   BIOPSY  07/05/2019   Procedure: BIOPSY;  Surgeon: Golda Claudis PENNER, MD;  [facility]: [facility];  Service: Endoscopy;;  gastric   BLADDER SURGERY  1950'S   CATARACT EXTRACTION W/PHACO Right 11/22/2012   Procedure: CATARACT EXTRACTION PHACO AND INTRAOCULAR LENS PLACEMENT (IOC);  Surgeon: Oneil T. Roz, MD;  Location: [facility] ORS;  Service: Ophthalmology;  Laterality: Right;  CDE 10.27   CATARACT EXTRACTION W/PHACO Left 12/20/2012   Procedure: CATARACT EXTRACTION PHACO AND INTRAOCULAR LENS PLACEMENT (IOC);  Surgeon: Oneil T. Roz, MD;  Location: [facility] ORS;  Service: Ophthalmology;  Laterality: Left;  CDE:6.67   COLONOSCOPY W/ POLYPECTOMY     CORONARY ARTERY BYPASS GRAFT  09/2009   4 vessels   ESOPHAGEAL DILATION  07/05/2019   Procedure: ESOPHAGEAL DILATION;  Surgeon: Golda Claudis PENNER, MD;  Location: [facility];  Service: Endoscopy;;   ESOPHAGOGASTRODUODENOSCOPY (EGD) WITH PROPOFOL  N/A 07/05/2019   Procedure: ESOPHAGOGASTRODUODENOSCOPY (EGD) WITH PROPOFOL ;  Surgeon: Golda Claudis PENNER, MD;  Location: [facility];  Service: Endoscopy;  Laterality: N/A;  110-office notified pt new arrival time 10:45am   FEMORAL HERNIA REPAIR  INSERTION OF SUPRAPUBIC CATHETER  04/04/2012   Procedure: INSERTION OF SUPRAPUBIC CATHETER;  Surgeon: Mark C Ottelin, MD;  Location: [facility];  Service: Urology;  Laterality: N/A;   LEAD REVISION N/A 12/30/2011   Procedure: LEAD REVISION;  Surgeon: Danelle LELON Birmingham, MD;  Location: [facility] CATH LAB;  Service: Cardiovascular;  Laterality: N/A;   LEFT HEART CATHETERIZATION WITH CORONARY/GRAFT ANGIOGRAM  12/29/2011   Procedure: LEFT HEART CATHETERIZATION WITH EL BILE;  Surgeon: Ozell Fell, MD;  Location: [facility] CATH LAB;  Service: Cardiovascular;;   PACEMAKER PLACEMENT     TEMPORARY PACEMAKER INSERTION N/A 12/29/2011    Procedure: TEMPORARY PACEMAKER INSERTION;  Surgeon: Ozell Fell, MD;  Location: [facility] CATH LAB;  Service: Cardiovascular;  Laterality: N/A;   TRANSURETHRAL PROSTATECTOMY WITH GYRUS INSTRUMENTS  04/04/2012   Procedure: TRANSURETHRAL PROSTATECTOMY WITH GYRUS INSTRUMENTS;  Surgeon: Mark C Ottelin, MD;  Location: [facility];  Service: Urology;  Laterality: N/A;  TURP with Gyrus and Suprapubic Tube Placement   YAG LASER APPLICATION Left 06/25/2015   Procedure: YAG LASER APPLICATION;  Surgeon: Oneil Platts, MD;  Location: [facility];  Service: Ophthalmology;  Laterality: Left;    Social History   Socioeconomic History   Marital status: Married    Spouse name: Not on file   Number of children: Not on file   Years of education: Not on file   Highest education level: Not on file  Occupational History   Occupation: Retired  Tobacco Use   Smoking status: Never   Smokeless tobacco: Current    Types: Chew  Vaping Use   Vaping status: Never Used  Substance and Sexual Activity   Alcohol use: No   Drug use: No   Sexual activity: Yes  Other Topics Concern   Not on file  Social History Narrative   Not on file   Social Drivers of Health   Financial Resource Strain: Not on file  Food Insecurity: Patient Unable To Answer (01/27/2024)   Hunger Vital Sign    Worried About Running Out of Food in the Last Year: Patient unable to answer    Ran Out of Food in the Last Year: Patient unable to answer  Transportation Needs: Patient Unable To Answer (01/27/2024)   PRAPARE - Transportation    Lack of Transportation (Medical): Patient unable to answer    Lack of Transportation (Non-Medical): Patient unable to answer  Physical Activity: Not on file  Stress: Not on file  Social Connections: Patient Unable To Answer (01/27/2024)   Social Connection and Isolation Panel    Frequency of Communication with Friends and Family: Patient unable to answer    Frequency of Social Gatherings with Friends and Family: Patient  unable to answer    Attends Religious Services: Patient unable to answer    Active Member of Clubs or Organizations: Patient unable to answer    Attends Banker Meetings: Patient unable to answer    Marital Status: Patient unable to answer  Intimate Partner Violence: Patient Unable To Answer (01/27/2024)   Humiliation, Afraid, Rape, and Kick questionnaire    Fear of Current or Ex-Partner: Patient unable to answer    Emotionally Abused: Patient unable to answer    Physically Abused: Patient unable to answer    Sexually Abused: Patient unable to answer   Family History  Problem Relation Age of Onset   Heart block Mother    Heart murmur Mother    Aneurysm Father    Coronary artery disease Brother  x3   Lung cancer Brother       VITAL SIGNS BP 139/73   Pulse 72   Temp 98.7 F (37.1 C)   Resp 16   Ht 5' 3 (1.6 m)   Wt 177 lb 12.8 oz (80.6 kg)   SpO2 98%   BMI 31.50 kg/m   Outpatient Encounter Medications as of 02/15/2024  Medication Sig   acetaminophen  (TYLENOL ) 325 MG tablet Take 2 tablets (650 mg total) by mouth every 6 (six) hours as needed for mild pain (pain score 1-3) or fever (or Fever >/= 101).   albuterol  (VENTOLIN  HFA) 108 (90 Base) MCG/ACT inhaler Inhale 2 puffs into the lungs every 6 (six) hours as needed for wheezing or shortness of breath.   furosemide  (LASIX ) 40 MG tablet Take 1 tablet (40 mg total) by mouth daily.   guaiFENesin -dextromethorphan  (ROBITUSSIN DM) 100-10 MG/5ML syrup Take 10 mLs by mouth every 4 (four) hours as needed for cough.   insulin  aspart (NOVOLOG  FLEXPEN) 100 UNIT/ML FlexPen Inject 5 Units into the skin 3 (three) times daily before meals.   ipratropium-albuterol  (DUONEB) 0.5-2.5 (3) MG/3ML SOLN Take 3 mLs by nebulization every 6 (six) hours as needed.   metoprolol  tartrate (LOPRESSOR ) 50 MG tablet Take 50 mg by mouth 2 (two) times daily.   OXYGEN Inhale 2 L into the lungs as directed.  to keep sats >90% Special  Instructions: Document O2 sat qshift. Every Shift; Day, Evening, Night   pantoprazole  (PROTONIX ) 20 MG tablet TAKE ONE TABLET (20MG  TOTAL) BY MOUTH TWO TIMES DAILY BEFORE A MEAL   phenazopyridine  (AZO URINARY PAIN RELIEF) 95 MG tablet Take 95 mg by mouth 2 (two) times daily.   potassium chloride  SA (KLOR-CON  M) 20 MEQ tablet TAKE (1) TABLET BY MOUTH ONCE DAILY TO REPLACE POTASSIUM   Probiotic Product (RISA-BID PROBIOTIC) TABS Take 250 tablets by mouth 3 (three) times daily.  Administer with meals three times daily for 10 days Three Times A Day 08:30 AM, 12:30 PM, 05:30 PM   simvastatin  (ZOCOR ) 20 MG tablet Take 1 tablet (20 mg total) by mouth daily.   SURE COMFORT PEN NEEDLES 31G X 5 MM MISC USE AS DIRECTED UP TO 3ITIMES DAILY.   tamsulosin  (FLOMAX ) 0.4 MG CAPS capsule Take 1 capsule (0.4 mg total) by mouth daily after supper.   methylPREDNISolone  (MEDROL ) 4 MG tablet Take 4 mg by mouth daily. 12mg ; oral Special Instructions: Administer 12mg  dose - one 8mg  and one 4mg  tablet on 02/04/24 Once - One Time 10:00 AM (Patient not taking: Reported on 02/15/2024)   phenazopyridine  (PYRIDIUM ) 100 MG tablet Take 1 tablet (100 mg total) by mouth 2 (two) times daily. (Patient not taking: Reported on 02/15/2024)   No facility-administered encounter medications on file as of 02/15/2024.     SIGNIFICANT DIAGNOSTIC EXAMS  TODAY  01-27-24: wbc 10.3; hgb 12.1; hct 36.0; mcv 93.5 plt 148; glucose 140; bun 37; creat 1.93; k+ 4.0 na++ 136 ca 10.1; gfr 33 protein 6.9 albumin 4.0; tsh 0.829; vitamin B12: 412; hgb A1c 6.7; urine culture: 20,000  Review of Systems  Constitutional:  Negative for malaise/fatigue.  Respiratory:  Negative for cough and shortness of breath.   Cardiovascular:  Negative for chest pain, palpitations and leg swelling.  Gastrointestinal:  Negative for abdominal pain, constipation and heartburn.  Musculoskeletal:  Negative for back pain, joint pain and myalgias.  Skin: Negative.    Neurological:  Negative for dizziness.  Psychiatric/Behavioral:  The patient is not nervous/anxious.  Physical Exam Constitutional:      General: He is not in acute distress.    Appearance: He is well-developed. He is not diaphoretic.  Neck:     Thyroid: No thyromegaly.  Cardiovascular:     Rate and Rhythm: Normal rate and regular rhythm.     Heart sounds: Normal heart sounds.  Pulmonary:     Effort: Pulmonary effort is normal. No respiratory distress.     Breath sounds: Normal breath sounds.  Abdominal:     General: Bowel sounds are normal. There is no distension.     Palpations: Abdomen is soft.     Tenderness: There is no abdominal tenderness.  Musculoskeletal:        General: Normal range of motion.     Cervical back: Neck supple.     Right lower leg: Edema present.     Left lower leg: Edema present.  Lymphadenopathy:     Cervical: No cervical adenopathy.  Skin:    General: Skin is warm and dry.  Neurological:     Mental Status: He is alert. Mental status is at baseline.  Psychiatric:        Mood and Affect: Mood normal.     ASSESSMENT/ PLAN:  Patient is being discharged with the following home health services:  pt/ot to evaluate and treat as indicated for gait balance strength adl training.   Patient is being discharged with the following durable medical equipment:  none needed   Patient has been advised to f/u with their PCP in 1-2 weeks to for a transitions of care visit.  Social services at their facility was responsible for arranging this appointment.  Pt was provided with adequate prescriptions of noncontrolled medications to reach the scheduled appointment .  For controlled substances, a limited supply was provided as appropriate for the individual patient.  If the pt normally receives these medications from a pain clinic or has a contract with another physician, these medications should be received from that clinic or physician only).    A 30 day supply of  his prescription medications have been sent to [facility]   Time spent with patient: 35 minutes: medications; home health; dme.    Barnie Seip NP [facility]   call [phone]

## 2024-02-15 NOTE — Telephone Encounter (Signed)
 Pt c/o swelling/edema: STAT if pt has developed SOB within 24 hours  If swelling, where is the swelling located? Legs and feet  How much weight have you gained and in what time span? 22 pounds since 8/27  Have you gained 2 pounds in a day or 5 pounds in a week?   Yes  Do you have a log of your daily weights (if so, list)?   Yes  Are you currently taking a fluid pill?  Yes  Are you currently SOB?   Patient taken off oxygen last week  Have you traveled recently in a car or plane for an extended period of time?   Caller Kingsley) stated patient has been having a lot of swelling in his feet and legs and is due to go home on Thursday (9/18).  Caller wants advice on next steps.

## 2024-02-15 NOTE — Telephone Encounter (Signed)
    I last saw him in 06/2023 and weight was 163 lbs at that time. By review of the chart, he was admitted to [facility] earlier this month for acute kidney failure and a UTI and Lasix  was reduced to 40mg  daily. Discharge weight not listed. Appears the [facility] rechecked labs on 9/10 and creatinine had improved to 1.39. Notes from the [facility] mention he has been receiving Lasix  40mg  daily as well. My initial recommendation would be to increase Lasix  to his prior dose of 60mg  daily and repeat a BMET in 2 weeks. If he IS TAKING 60mg  daily, would increase to 40mg  BID and repeat BMET in 2 weeks. Would also schedule a follow-up visit within the next few weeks for reassessment.   Will copy his provider at the [facility] in this message as well.   Signed, Laymon CHRISTELLA Qua, PA-C 02/15/2024, 1:18 PM Pager: [phone]

## 2024-02-16 ENCOUNTER — Other Ambulatory Visit: Payer: Self-pay | Admitting: Adult Health

## 2024-02-16 MED ORDER — FUROSEMIDE 40 MG PO TABS: 60.0000 mg | ORAL_TABLET | Freq: Every day | ORAL | 0 refills | Status: DC

## 2024-02-16 MED ORDER — TAMSULOSIN HCL 0.4 MG PO CAPS: 0.4000 mg | ORAL_CAPSULE | Freq: Every day | ORAL | 0 refills | Status: DC

## 2024-02-16 MED ORDER — ALBUTEROL SULFATE HFA 108 (90 BASE) MCG/ACT IN AERS: 2.0000 | INHALATION_SPRAY | Freq: Four times a day (QID) | RESPIRATORY_TRACT | 0 refills | Status: DC | PRN

## 2024-02-16 MED ORDER — POTASSIUM CHLORIDE CRYS ER 20 MEQ PO TBCR: 20.0000 meq | EXTENDED_RELEASE_TABLET | Freq: Every day | ORAL | 0 refills | Status: DC

## 2024-02-16 MED ORDER — SIMVASTATIN 20 MG PO TABS: 20.0000 mg | ORAL_TABLET | Freq: Every day | ORAL | 0 refills | Status: DC

## 2024-02-16 MED ORDER — IPRATROPIUM-ALBUTEROL 0.5-2.5 (3) MG/3ML IN SOLN: 3.0000 mL | Freq: Four times a day (QID) | RESPIRATORY_TRACT | 0 refills | Status: DC | PRN

## 2024-02-16 MED ORDER — METOPROLOL TARTRATE 50 MG PO TABS: 50.0000 mg | ORAL_TABLET | Freq: Two times a day (BID) | ORAL | 0 refills | Status: DC

## 2024-02-16 MED ORDER — NOVOLOG FLEXPEN 100 UNIT/ML ~~LOC~~ SOPN: 5.0000 [IU] | PEN_INJECTOR | Freq: Three times a day (TID) | SUBCUTANEOUS | 0 refills | Status: DC

## 2024-02-16 MED ORDER — PANTOPRAZOLE SODIUM 20 MG PO TBEC: DELAYED_RELEASE_TABLET | ORAL | 0 refills | Status: DC

## 2024-02-21 DIAGNOSIS — I2510 Atherosclerotic heart disease of native coronary artery without angina pectoris: Secondary | ICD-10-CM | POA: Diagnosis not present

## 2024-02-21 DIAGNOSIS — G9341 Metabolic encephalopathy: Secondary | ICD-10-CM | POA: Diagnosis not present

## 2024-02-21 DIAGNOSIS — I129 Hypertensive chronic kidney disease with stage 1 through stage 4 chronic kidney disease, or unspecified chronic kidney disease: Secondary | ICD-10-CM | POA: Diagnosis not present

## 2024-02-21 DIAGNOSIS — E1122 Type 2 diabetes mellitus with diabetic chronic kidney disease: Secondary | ICD-10-CM | POA: Diagnosis not present

## 2024-02-21 DIAGNOSIS — N390 Urinary tract infection, site not specified: Secondary | ICD-10-CM | POA: Diagnosis not present

## 2024-02-21 DIAGNOSIS — N1831 Chronic kidney disease, stage 3a: Secondary | ICD-10-CM | POA: Diagnosis not present

## 2024-02-21 DIAGNOSIS — E1159 Type 2 diabetes mellitus with other circulatory complications: Secondary | ICD-10-CM | POA: Diagnosis not present

## 2024-02-21 DIAGNOSIS — E041 Nontoxic single thyroid nodule: Secondary | ICD-10-CM | POA: Diagnosis not present

## 2024-02-21 DIAGNOSIS — E785 Hyperlipidemia, unspecified: Secondary | ICD-10-CM | POA: Diagnosis not present

## 2024-02-23 DIAGNOSIS — E1159 Type 2 diabetes mellitus with other circulatory complications: Secondary | ICD-10-CM | POA: Diagnosis not present

## 2024-02-23 DIAGNOSIS — I129 Hypertensive chronic kidney disease with stage 1 through stage 4 chronic kidney disease, or unspecified chronic kidney disease: Secondary | ICD-10-CM | POA: Diagnosis not present

## 2024-02-23 DIAGNOSIS — E041 Nontoxic single thyroid nodule: Secondary | ICD-10-CM | POA: Diagnosis not present

## 2024-02-23 DIAGNOSIS — N1831 Chronic kidney disease, stage 3a: Secondary | ICD-10-CM | POA: Diagnosis not present

## 2024-02-23 DIAGNOSIS — G9341 Metabolic encephalopathy: Secondary | ICD-10-CM | POA: Diagnosis not present

## 2024-02-23 DIAGNOSIS — N390 Urinary tract infection, site not specified: Secondary | ICD-10-CM | POA: Diagnosis not present

## 2024-02-23 DIAGNOSIS — E785 Hyperlipidemia, unspecified: Secondary | ICD-10-CM | POA: Diagnosis not present

## 2024-02-23 DIAGNOSIS — E1122 Type 2 diabetes mellitus with diabetic chronic kidney disease: Secondary | ICD-10-CM | POA: Diagnosis not present

## 2024-02-28 ENCOUNTER — Encounter

## 2024-02-28 DIAGNOSIS — I509 Heart failure, unspecified: Secondary | ICD-10-CM | POA: Diagnosis not present

## 2024-02-28 DIAGNOSIS — Z0000 Encounter for general adult medical examination without abnormal findings: Secondary | ICD-10-CM | POA: Diagnosis not present

## 2024-02-28 DIAGNOSIS — G9341 Metabolic encephalopathy: Secondary | ICD-10-CM | POA: Diagnosis not present

## 2024-02-28 DIAGNOSIS — Z87440 Personal history of urinary (tract) infections: Secondary | ICD-10-CM | POA: Diagnosis not present

## 2024-02-28 DIAGNOSIS — R0902 Hypoxemia: Secondary | ICD-10-CM | POA: Diagnosis not present

## 2024-02-28 DIAGNOSIS — R092 Respiratory arrest: Secondary | ICD-10-CM | POA: Diagnosis not present

## 2024-02-28 DIAGNOSIS — I110 Hypertensive heart disease with heart failure: Secondary | ICD-10-CM | POA: Diagnosis not present

## 2024-02-29 DIAGNOSIS — Z79899 Other long term (current) drug therapy: Secondary | ICD-10-CM | POA: Diagnosis not present

## 2024-03-01 DIAGNOSIS — E041 Nontoxic single thyroid nodule: Secondary | ICD-10-CM | POA: Diagnosis not present

## 2024-03-01 NOTE — Progress Notes (Signed)
 Remote PPM Transmission

## 2024-03-02 ENCOUNTER — Encounter: Payer: Self-pay | Admitting: Student

## 2024-03-02 ENCOUNTER — Ambulatory Visit: Payer: Self-pay | Admitting: Student

## 2024-03-02 ENCOUNTER — Encounter: Payer: Self-pay | Admitting: *Deleted

## 2024-03-02 ENCOUNTER — Telehealth: Payer: Self-pay

## 2024-03-02 ENCOUNTER — Ambulatory Visit: Attending: Student | Admitting: Student

## 2024-03-02 VITALS — BP 110/70 | HR 70 | Ht 67.0 in | Wt 176.4 lb

## 2024-03-02 DIAGNOSIS — I442 Atrioventricular block, complete: Secondary | ICD-10-CM

## 2024-03-02 DIAGNOSIS — G9341 Metabolic encephalopathy: Secondary | ICD-10-CM | POA: Diagnosis not present

## 2024-03-02 DIAGNOSIS — E1159 Type 2 diabetes mellitus with other circulatory complications: Secondary | ICD-10-CM | POA: Diagnosis not present

## 2024-03-02 DIAGNOSIS — I129 Hypertensive chronic kidney disease with stage 1 through stage 4 chronic kidney disease, or unspecified chronic kidney disease: Secondary | ICD-10-CM | POA: Diagnosis not present

## 2024-03-02 DIAGNOSIS — E785 Hyperlipidemia, unspecified: Secondary | ICD-10-CM

## 2024-03-02 DIAGNOSIS — E041 Nontoxic single thyroid nodule: Secondary | ICD-10-CM | POA: Diagnosis not present

## 2024-03-02 DIAGNOSIS — I351 Nonrheumatic aortic (valve) insufficiency: Secondary | ICD-10-CM | POA: Diagnosis not present

## 2024-03-02 DIAGNOSIS — I2510 Atherosclerotic heart disease of native coronary artery without angina pectoris: Secondary | ICD-10-CM | POA: Diagnosis not present

## 2024-03-02 DIAGNOSIS — N1831 Chronic kidney disease, stage 3a: Secondary | ICD-10-CM | POA: Diagnosis not present

## 2024-03-02 DIAGNOSIS — Z01818 Encounter for other preprocedural examination: Secondary | ICD-10-CM

## 2024-03-02 DIAGNOSIS — I10 Essential (primary) hypertension: Secondary | ICD-10-CM

## 2024-03-02 DIAGNOSIS — I5032 Chronic diastolic (congestive) heart failure: Secondary | ICD-10-CM | POA: Diagnosis not present

## 2024-03-02 DIAGNOSIS — N390 Urinary tract infection, site not specified: Secondary | ICD-10-CM | POA: Diagnosis not present

## 2024-03-02 DIAGNOSIS — E1122 Type 2 diabetes mellitus with diabetic chronic kidney disease: Secondary | ICD-10-CM | POA: Diagnosis not present

## 2024-03-02 NOTE — Telephone Encounter (Signed)
 Device check in clinic.  Pt RRT 02/20/2024.

## 2024-03-02 NOTE — Progress Notes (Signed)
 Cardiology Office Note    Date:  03/02/2024  ID:  Guy, Roberson 1931-08-08, MRN [id number] Cardiologist: Danelle Birmingham, MD { :  History of Present Illness:    Guy Roberson is a 89 y.o. male with past medical history of CHB (s/p Medtronic PPM placement in 11/2011), CAD (s/p CABG in 2011 with LIMA-LAD, SVG-D1, SVG-OM and SVG-PDA, cath in 2012 showing 4/4 patent grafts), seizures, HTN, HLD, BPH and Type 2 DM who presents to the office today for hospital follow-up.   He was last examined by Dr. Birmingham in 07/2023 and reported weakness along his lower extremities and worsening shortness of breath. Device was functioning normally at that time and he was continued on Lasix  60 mg daily, Lisinopril  10 mg twice daily, Lopressor  50 mg twice daily and Simvastatin  20 mg daily. Device interrogation 11/2023 showed battery life estimated at 2 months and he has not had a repeat device check since.  In the interim, he was admitted to [facility] from 8/28 - 02/02/2024 for acute on chronic renal failure in the setting of providencia rettgeri UTI. Was appropriately treated with antibiotic therapy. He did have metabolic encephalopathy which was felt to be due to dehydration and his UTI. Creatinine peaked at 2.08 and had improved to 1.39 at discharge. At the time of discharge, Lisinopril  was discontinued and Lasix  was reduced to 40 mg daily.  He was discharged to the [facility] and his daughter-in-law called the office on 02/15/2024 reporting that he had gained 22 pounds. It was recommended to increase Lasix  to his prior dose of 60 mg daily and obtain follow-up labs in 2 weeks. He did have repeat labs on 02/29/2024 and creatinine had trended up to 1.75 and K+ was at 4.9. BNP pending.  In talking with the patient and his family members today, they report that his lower extremity edema has improved since being back home. They mention that he was consuming a high calorie and high sodium diet while at rehab. He has also  resumed taking Lasix  60 mg daily and has experienced improvement in symptoms. He mostly uses a wheelchair throughout the day and a walker for ambulation. Working with Home Health PT. He denies any specific exertional chest pain or palpitations. Has baseline dyspnea on exertion. No specific orthopnea or PND.  Studies Reviewed:   EKG: EKG is not ordered today. EKG from 01/27/2024 is reviewed and shows V-paced rhythm, HR 66.  Echo Complete: 07/2022 IMPRESSIONS     1. Left ventricular ejection fraction, by estimation, is 50 to 55%. The  left ventricle has low normal function. Left ventricular endocardial  border not optimally defined to evaluate regional wall motion. There is  moderate left ventricular hypertrophy.  Left ventricular diastolic function could not be evaluated. The E/e' is  25.4. There is the interventricular septum is flattened in systole and  diastole, consistent with right ventricular pressure and volume overload.   2. Right ventricular systolic function is normal. The right ventricular  size is normal. There is severely elevated pulmonary artery systolic  pressure. The estimated right ventricular systolic pressure is 61.3 mmHg.   3. Left atrial size was severely dilated.   4. A small pericardial effusion is present. The pericardial effusion is  localized near the left atrium.   5. The mitral valve is abnormal. Mild to moderate mitral valve  regurgitation. No evidence of mitral stenosis. Moderate to severe mitral  annular calcification.   6. The tricuspid valve is abnormal. Tricuspid valve  regurgitation is  moderate.   7. The aortic valve is tricuspid. There is mild calcification of the  aortic valve. Aortic valve regurgitation is mild to moderate. No aortic  stenosis is present.   8. The inferior vena cava is dilated in size with >50% respiratory  variability, suggesting right atrial pressure of 8 mmHg.   Comparison(s): No significant change from prior study.    Limited Echo: 01/2023 IMPRESSIONS     1. Limited Echo to evaluate for pericardial effusion. Doppler analysis  not performed.   2. Left ventricular ejection fraction, by estimation, is 55 to 60%. The  left ventricle has normal function. The left ventricle has no regional  wall motion abnormalities. There is severe asymmetric left ventricular  hypertrophy of the septal segment. Left   ventricular diastolic function could not be evaluated.   3. No evidence of pericardial effusion.   Physical Exam:   VS:  BP 110/70   Pulse 70   Ht 5' 7 (1.702 m)   Wt 176 lb 6.4 oz (80 kg)   SpO2 97%   BMI 27.63 kg/m    Wt Readings from Last 3 Encounters:  03/02/24 176 lb 6.4 oz (80 kg)  02/15/24 177 lb 12.8 oz (80.6 kg)  02/08/24 179 lb (81.2 kg)     GEN: Pleasant, elderly male appearing in no acute distress NECK: No JVD; No carotid bruits CARDIAC: RRR, 2/6 diastolic  murmur along RUSB.  RESPIRATORY:  Clear to auscultation without rales, wheezing or rhonchi  ABDOMEN: Appears non-distended. No obvious abdominal masses. EXTREMITIES: No clubbing or cyanosis. 1+ pitting edema bilaterally.  Distal pedal pulses are 2+ bilaterally.   Assessment and Plan:   1. Chronic heart failure with preserved ejection fraction (HCC) - Limited echo in 01/2023 showed a preserved EF of 55 to 60%. He does have 1+ pitting edema on examination today but lungs are clear. Reviewed with Dr. Waddell and his device being at [facility] is likely contributing to symptoms as well. For now, will continue with Lasix  60 mg daily and not further titrate. Encouraged him to follow daily weights at home and if weight increases by more than 2 pounds overnight of 5 pounds in 1 week, could take an extra 40mg  of Lasix . Would not use an SGLT2 inhibitor given his history of UTI's and urinary incontinence.  2. Coronary artery disease involving native coronary artery of native heart without angina pectoris - He previously underwent CABG in 2011  with LIMA-LAD, SVG-D1, SVG-OM and SVG-PDA and cath in 2012 showed 4/4 patent grafts. While his activity is limited, he denies any recent chest pain. - It is unclear why he is not on ASA 81 mg daily but did recommend restarting this. Continue Lopressor  50 mg twice daily and Simvastatin  20 mg daily.  3. Complete heart block (HCC) - He does have a Medtronic PPM in place and most recent device interrogation in 11/2023 showed that he had 2 months of battery life remaining.  - His device was interrogated in clinic today and has been RRT since 02/20/2024 which has likely being contributing to his fluid overload. Dr. Waddell and Dr. Almetta were kind enough to evaluate the patient in clinic and he has been scheduled for gen change on 03/09/2024.  4. Aortic valve insufficiency, etiology of cardiac valve disease unspecified - Echocardiogram in 07/2022 showed mild to moderate mitral valve regurgitation and mild to moderate aortic valve regurgitation. Would plan for a follow-up echocardiogram within the next 1 to 2 years for reassessment.  Given his age and frailty, would anticipate medical management at this time.  5. Essential hypertension - BP is well-controlled at 110/70 during today's visit. Continue current medical therapy with Lopressor  50 mg twice daily.  6. Hyperlipidemia LDL goal <55 - LDL was at 39 when checked in 09/2023. Continue current medical therapy with Simvastatin  20 mg daily.   Signed, Laymon CHRISTELLA Qua, PA-C   Patient seen and evaluated on 03/02/2024 with Dr. Waddell. LEFT MDT Adapta PPM at RRT on 02/20/24 and now at VVI 65. He is having mild sx likely from VVI 65 pacing with SOB and LE edema.  Reviewed the risk, benefits, and alternatives of PPM generator replacement including but not limited to pain at the incision site, infection, bleeding, damage to existing leads, and reactions to anesthesia.  The patient and his family are agreeable to proceed.  Dr. Waddell is coordinating for  generator change out next week.  Prior device incision well-healed without any evidence of inflammation or erosion.  Donnice DELENA Primus, MD [facility]  Cardiac Electrophysiology

## 2024-03-02 NOTE — Patient Instructions (Signed)
 Medication Instructions:  Your physician recommends that you continue on your current medications as directed. Please refer to the Current Medication list given to you today.  BUT YOU CAN TAKE AN EXTRA LASIX  IF YOU HAVE 2 LB INCREASE IN 24 HOURS OR 5 LBS IN A WEEK  *If you need a refill on your cardiac medications before your next appointment, please call your pharmacy*  Lab Work: None ordered  If you have labs (blood work) drawn today and your tests are completely normal, you will receive your results only by: MyChart Message (if you have MyChart) OR A paper copy in the mail If you have any lab test that is abnormal or we need to change your treatment, we will call you to review the results.  Testing/Procedures: Nonde ordered  Follow-Up: At [facility], you and your health needs are our priority.  As part of our continuing mission to provide you with exceptional heart care, our providers are all part of one team.  This team includes your primary Cardiologist (physician) and Advanced Practice Providers or APPs (Physician Assistants and Nurse Practitioners) who all work together to provide you with the care you need, when you need it.  Your next appointment:   2 month(s)  Provider:   Laymon Qua, PA-C    We recommend signing up for the patient portal called MyChart.  Sign up information is provided on this After Visit Summary.  MyChart is used to connect with patients for Virtual Visits (Telemedicine).  Patients are able to view lab/test results, encounter notes, upcoming appointments, etc.  Non-urgent messages can be sent to your provider as well.   To learn more about what you can do with MyChart, go to [email or web address].   Other Instructions

## 2024-03-03 LAB — BASIC METABOLIC PANEL WITH GFR
BUN/Creatinine Ratio: 22 (ref 10–24)
BUN: 38 mg/dL — ABNORMAL HIGH (ref 8–27)
CO2: 23 mmol/L (ref 20–29)
Calcium: 10.4 mg/dL — ABNORMAL HIGH (ref 8.6–10.2)
Chloride: 97 mmol/L (ref 96–106)
Creatinine, Ser: 1.75 mg/dL — ABNORMAL HIGH (ref 0.76–1.27)
Glucose: 98 mg/dL (ref 70–99)
Potassium: 4.9 mmol/L (ref 3.5–5.2)
Sodium: 135 mmol/L (ref 134–144)
eGFR: 37 mL/min/1.73 — ABNORMAL LOW (ref >59)

## 2024-03-03 LAB — BRAIN NATRIURETIC PEPTIDE: BNP: 1379 pg/mL — AB (ref 0.0–100.0)

## 2024-03-06 DIAGNOSIS — E785 Hyperlipidemia, unspecified: Secondary | ICD-10-CM | POA: Diagnosis not present

## 2024-03-06 DIAGNOSIS — G9341 Metabolic encephalopathy: Secondary | ICD-10-CM | POA: Diagnosis not present

## 2024-03-06 DIAGNOSIS — N1831 Chronic kidney disease, stage 3a: Secondary | ICD-10-CM | POA: Diagnosis not present

## 2024-03-06 DIAGNOSIS — I2510 Atherosclerotic heart disease of native coronary artery without angina pectoris: Secondary | ICD-10-CM | POA: Diagnosis not present

## 2024-03-06 DIAGNOSIS — I129 Hypertensive chronic kidney disease with stage 1 through stage 4 chronic kidney disease, or unspecified chronic kidney disease: Secondary | ICD-10-CM | POA: Diagnosis not present

## 2024-03-06 DIAGNOSIS — E1159 Type 2 diabetes mellitus with other circulatory complications: Secondary | ICD-10-CM | POA: Diagnosis not present

## 2024-03-06 DIAGNOSIS — E1122 Type 2 diabetes mellitus with diabetic chronic kidney disease: Secondary | ICD-10-CM | POA: Diagnosis not present

## 2024-03-06 DIAGNOSIS — E041 Nontoxic single thyroid nodule: Secondary | ICD-10-CM | POA: Diagnosis not present

## 2024-03-07 DIAGNOSIS — E1159 Type 2 diabetes mellitus with other circulatory complications: Secondary | ICD-10-CM | POA: Diagnosis not present

## 2024-03-07 DIAGNOSIS — I2510 Atherosclerotic heart disease of native coronary artery without angina pectoris: Secondary | ICD-10-CM | POA: Diagnosis not present

## 2024-03-07 DIAGNOSIS — G9341 Metabolic encephalopathy: Secondary | ICD-10-CM | POA: Diagnosis not present

## 2024-03-07 DIAGNOSIS — E041 Nontoxic single thyroid nodule: Secondary | ICD-10-CM | POA: Diagnosis not present

## 2024-03-07 DIAGNOSIS — E1122 Type 2 diabetes mellitus with diabetic chronic kidney disease: Secondary | ICD-10-CM | POA: Diagnosis not present

## 2024-03-07 DIAGNOSIS — N390 Urinary tract infection, site not specified: Secondary | ICD-10-CM | POA: Diagnosis not present

## 2024-03-07 DIAGNOSIS — N1831 Chronic kidney disease, stage 3a: Secondary | ICD-10-CM | POA: Diagnosis not present

## 2024-03-07 DIAGNOSIS — E785 Hyperlipidemia, unspecified: Secondary | ICD-10-CM | POA: Diagnosis not present

## 2024-03-08 NOTE — Pre-Procedure Instructions (Signed)
 Spoke with daughter-n-law [name] regarding procedure instructions for tomorrow.   Arrival time 0900 Nothing to eat or drink after midnight No meds AM of procedure Responsible person to drive you home and stay with you for 24 hrs Wash with special soap night before and morning of procedure

## 2024-03-09 ENCOUNTER — Ambulatory Visit (HOSPITAL_COMMUNITY)
Admission: RE | Admit: 2024-03-09 | Discharge: 2024-03-09 | Disposition: A | Discharge status: Home / Self Care | Attending: Internal Medicine | Admitting: Internal Medicine

## 2024-03-09 ENCOUNTER — Encounter (HOSPITAL_COMMUNITY)
Admission: RE | Disposition: A | Discharge status: Home / Self Care | Payer: Self-pay | Source: Home / Self Care | Attending: Internal Medicine

## 2024-03-09 DIAGNOSIS — I442 Atrioventricular block, complete: Secondary | ICD-10-CM | POA: Diagnosis not present

## 2024-03-09 DIAGNOSIS — Z45010 Encounter for checking and testing of cardiac pacemaker pulse generator [battery]: Secondary | ICD-10-CM | POA: Diagnosis not present

## 2024-03-09 DIAGNOSIS — I110 Hypertensive heart disease with heart failure: Secondary | ICD-10-CM | POA: Diagnosis not present

## 2024-03-09 DIAGNOSIS — I495 Sick sinus syndrome: Secondary | ICD-10-CM | POA: Diagnosis not present

## 2024-03-09 DIAGNOSIS — Z79899 Other long term (current) drug therapy: Secondary | ICD-10-CM | POA: Diagnosis not present

## 2024-03-09 DIAGNOSIS — F17220 Nicotine dependence, chewing tobacco, uncomplicated: Secondary | ICD-10-CM | POA: Insufficient documentation

## 2024-03-09 DIAGNOSIS — N400 Enlarged prostate without lower urinary tract symptoms: Secondary | ICD-10-CM | POA: Insufficient documentation

## 2024-03-09 DIAGNOSIS — I509 Heart failure, unspecified: Secondary | ICD-10-CM | POA: Diagnosis not present

## 2024-03-09 HISTORY — PX: PPM GENERATOR CHANGEOUT: EP1233

## 2024-03-09 LAB — GLUCOSE, CAPILLARY: Glucose-Capillary: 101 mg/dL — ABNORMAL HIGH (ref 70–99)

## 2024-03-09 LAB — CBC
HCT: 35.8 % — ABNORMAL LOW (ref 39.0–52.0)
Hemoglobin: 11.6 g/dL — ABNORMAL LOW (ref 13.0–17.0)
MCH: 29.5 pg (ref 26.0–34.0)
MCHC: 32.4 g/dL (ref 30.0–36.0)
MCV: 91.1 fL (ref 80.0–100.0)
Platelets: 112 K/uL — ABNORMAL LOW (ref 150–400)
RBC: 3.93 MIL/uL — ABNORMAL LOW (ref 4.22–5.81)
RDW: 14.6 % (ref 11.5–15.5)
WBC: 7.7 K/uL (ref 4.0–10.5)
nRBC: 0 % (ref 0.0–0.2)

## 2024-03-09 SURGERY — PPM GENERATOR CHANGEOUT

## 2024-03-09 MED ORDER — SODIUM CHLORIDE 0.9 % IV SOLN: 80.0000 mg | INTRAVENOUS | Status: AC

## 2024-03-09 MED ORDER — POVIDONE-IODINE 10 % EX SWAB
2.0000 | Freq: Once | CUTANEOUS | Status: AC
  Administered 2024-03-09: 2 via TOPICAL

## 2024-03-09 MED ORDER — SODIUM CHLORIDE 0.9 % IV SOLN: INTRAVENOUS | Status: DC

## 2024-03-09 MED ORDER — CHLORHEXIDINE GLUCONATE 4 % EX SOLN
4.0000 | Freq: Once | CUTANEOUS | Status: DC
  Filled 2024-03-09: qty 60

## 2024-03-09 MED ORDER — ONDANSETRON HCL 4 MG/2ML IJ SOLN: 4.0000 mg | Freq: Four times a day (QID) | INTRAMUSCULAR | Status: DC | PRN

## 2024-03-09 MED ORDER — SODIUM CHLORIDE 0.9 % IV SOLN
INTRAVENOUS | Status: AC
  Administered 2024-03-09: 80 mg
  Filled 2024-03-09: qty 2

## 2024-03-09 MED ORDER — LIDOCAINE HCL 1 % IJ SOLN
INTRAMUSCULAR | Status: AC
  Filled 2024-03-09: qty 60

## 2024-03-09 MED ORDER — CEFAZOLIN SODIUM-DEXTROSE 2-4 GM/100ML-% IV SOLN
INTRAVENOUS | Status: AC
  Administered 2024-03-09: 2 g via INTRAVENOUS
  Filled 2024-03-09: qty 100

## 2024-03-09 MED ORDER — CEFAZOLIN SODIUM-DEXTROSE 2-4 GM/100ML-% IV SOLN: 2.0000 g | INTRAVENOUS | Status: AC

## 2024-03-09 MED ORDER — ACETAMINOPHEN 325 MG PO TABS: 325.0000 mg | ORAL_TABLET | ORAL | Status: DC | PRN

## 2024-03-09 MED ORDER — LIDOCAINE HCL (PF) 1 % IJ SOLN
INTRAMUSCULAR | Status: DC | PRN
  Administered 2024-03-09: 50 mL

## 2024-03-09 SURGICAL SUPPLY — 5 items
CABLE SURGICAL S-101-97-12 (CABLE) ×1 IMPLANT
IPG PACE AZUR XT DR MRI W1DR01 (Pacemaker) IMPLANT
PAD DEFIB RADIO PHYSIO CONN (PAD) ×1 IMPLANT
POUCH AIGIS-R ANTIBACT PPM MED (Mesh General) IMPLANT
TRAY PACEMAKER INSERTION (PACKS) ×1 IMPLANT
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
## 2024-03-09 NOTE — Discharge Instructions (Signed)

## 2024-03-09 NOTE — Progress Notes (Signed)
 Discahrge instructions reviewed with patient and [name] (Daughter in [place]) at bedside. PT denies questions or concerns. Dressing was marked with spotting at the time of arrival, no new drainage during short stay. PT was not able to ambulate in the hallway prior to discharge r/r to baseline condition. PT had multiple episodes of incontinence, while here. PT escorted from unit via wheel chair to personal vehicle.

## 2024-03-10 ENCOUNTER — Encounter (HOSPITAL_COMMUNITY): Payer: Self-pay | Admitting: Internal Medicine

## 2024-03-10 DIAGNOSIS — N390 Urinary tract infection, site not specified: Secondary | ICD-10-CM | POA: Diagnosis not present

## 2024-03-10 DIAGNOSIS — G9341 Metabolic encephalopathy: Secondary | ICD-10-CM | POA: Diagnosis not present

## 2024-03-10 DIAGNOSIS — I2510 Atherosclerotic heart disease of native coronary artery without angina pectoris: Secondary | ICD-10-CM | POA: Diagnosis not present

## 2024-03-10 DIAGNOSIS — E1159 Type 2 diabetes mellitus with other circulatory complications: Secondary | ICD-10-CM | POA: Diagnosis not present

## 2024-03-10 DIAGNOSIS — I129 Hypertensive chronic kidney disease with stage 1 through stage 4 chronic kidney disease, or unspecified chronic kidney disease: Secondary | ICD-10-CM | POA: Diagnosis not present

## 2024-03-10 DIAGNOSIS — E785 Hyperlipidemia, unspecified: Secondary | ICD-10-CM | POA: Diagnosis not present

## 2024-03-10 DIAGNOSIS — E1122 Type 2 diabetes mellitus with diabetic chronic kidney disease: Secondary | ICD-10-CM | POA: Diagnosis not present

## 2024-03-10 DIAGNOSIS — N1831 Chronic kidney disease, stage 3a: Secondary | ICD-10-CM | POA: Diagnosis not present

## 2024-03-10 DIAGNOSIS — E041 Nontoxic single thyroid nodule: Secondary | ICD-10-CM | POA: Diagnosis not present

## 2024-03-16 DIAGNOSIS — E1159 Type 2 diabetes mellitus with other circulatory complications: Secondary | ICD-10-CM | POA: Diagnosis not present

## 2024-03-16 DIAGNOSIS — E041 Nontoxic single thyroid nodule: Secondary | ICD-10-CM | POA: Diagnosis not present

## 2024-03-16 DIAGNOSIS — N390 Urinary tract infection, site not specified: Secondary | ICD-10-CM | POA: Diagnosis not present

## 2024-03-16 DIAGNOSIS — N1831 Chronic kidney disease, stage 3a: Secondary | ICD-10-CM | POA: Diagnosis not present

## 2024-03-16 DIAGNOSIS — E785 Hyperlipidemia, unspecified: Secondary | ICD-10-CM | POA: Diagnosis not present

## 2024-03-16 DIAGNOSIS — G9341 Metabolic encephalopathy: Secondary | ICD-10-CM | POA: Diagnosis not present

## 2024-03-16 DIAGNOSIS — I2510 Atherosclerotic heart disease of native coronary artery without angina pectoris: Secondary | ICD-10-CM | POA: Diagnosis not present

## 2024-03-16 DIAGNOSIS — I129 Hypertensive chronic kidney disease with stage 1 through stage 4 chronic kidney disease, or unspecified chronic kidney disease: Secondary | ICD-10-CM | POA: Diagnosis not present

## 2024-03-16 DIAGNOSIS — E1122 Type 2 diabetes mellitus with diabetic chronic kidney disease: Secondary | ICD-10-CM | POA: Diagnosis not present

## 2024-03-20 DIAGNOSIS — E1122 Type 2 diabetes mellitus with diabetic chronic kidney disease: Secondary | ICD-10-CM | POA: Diagnosis not present

## 2024-03-20 DIAGNOSIS — E1159 Type 2 diabetes mellitus with other circulatory complications: Secondary | ICD-10-CM | POA: Diagnosis not present

## 2024-03-20 DIAGNOSIS — I129 Hypertensive chronic kidney disease with stage 1 through stage 4 chronic kidney disease, or unspecified chronic kidney disease: Secondary | ICD-10-CM | POA: Diagnosis not present

## 2024-03-20 DIAGNOSIS — N1831 Chronic kidney disease, stage 3a: Secondary | ICD-10-CM | POA: Diagnosis not present

## 2024-03-20 DIAGNOSIS — I2510 Atherosclerotic heart disease of native coronary artery without angina pectoris: Secondary | ICD-10-CM | POA: Diagnosis not present

## 2024-03-20 DIAGNOSIS — G9341 Metabolic encephalopathy: Secondary | ICD-10-CM | POA: Diagnosis not present

## 2024-03-20 DIAGNOSIS — N390 Urinary tract infection, site not specified: Secondary | ICD-10-CM | POA: Diagnosis not present

## 2024-03-20 DIAGNOSIS — E041 Nontoxic single thyroid nodule: Secondary | ICD-10-CM | POA: Diagnosis not present

## 2024-03-20 DIAGNOSIS — E785 Hyperlipidemia, unspecified: Secondary | ICD-10-CM | POA: Diagnosis not present

## 2024-03-22 DIAGNOSIS — I2510 Atherosclerotic heart disease of native coronary artery without angina pectoris: Secondary | ICD-10-CM | POA: Diagnosis not present

## 2024-03-22 DIAGNOSIS — N1831 Chronic kidney disease, stage 3a: Secondary | ICD-10-CM | POA: Diagnosis not present

## 2024-03-22 DIAGNOSIS — E041 Nontoxic single thyroid nodule: Secondary | ICD-10-CM | POA: Diagnosis not present

## 2024-03-22 DIAGNOSIS — E785 Hyperlipidemia, unspecified: Secondary | ICD-10-CM | POA: Diagnosis not present

## 2024-03-22 DIAGNOSIS — E1159 Type 2 diabetes mellitus with other circulatory complications: Secondary | ICD-10-CM | POA: Diagnosis not present

## 2024-03-22 DIAGNOSIS — N390 Urinary tract infection, site not specified: Secondary | ICD-10-CM | POA: Diagnosis not present

## 2024-03-22 DIAGNOSIS — E1122 Type 2 diabetes mellitus with diabetic chronic kidney disease: Secondary | ICD-10-CM | POA: Diagnosis not present

## 2024-03-22 DIAGNOSIS — I129 Hypertensive chronic kidney disease with stage 1 through stage 4 chronic kidney disease, or unspecified chronic kidney disease: Secondary | ICD-10-CM | POA: Diagnosis not present

## 2024-03-24 DIAGNOSIS — E1159 Type 2 diabetes mellitus with other circulatory complications: Secondary | ICD-10-CM | POA: Diagnosis not present

## 2024-03-24 DIAGNOSIS — I2510 Atherosclerotic heart disease of native coronary artery without angina pectoris: Secondary | ICD-10-CM | POA: Diagnosis not present

## 2024-03-24 DIAGNOSIS — N1831 Chronic kidney disease, stage 3a: Secondary | ICD-10-CM | POA: Diagnosis not present

## 2024-03-24 DIAGNOSIS — E785 Hyperlipidemia, unspecified: Secondary | ICD-10-CM | POA: Diagnosis not present

## 2024-03-24 DIAGNOSIS — G9341 Metabolic encephalopathy: Secondary | ICD-10-CM | POA: Diagnosis not present

## 2024-03-24 DIAGNOSIS — E1122 Type 2 diabetes mellitus with diabetic chronic kidney disease: Secondary | ICD-10-CM | POA: Diagnosis not present

## 2024-03-24 DIAGNOSIS — E041 Nontoxic single thyroid nodule: Secondary | ICD-10-CM | POA: Diagnosis not present

## 2024-03-24 DIAGNOSIS — I129 Hypertensive chronic kidney disease with stage 1 through stage 4 chronic kidney disease, or unspecified chronic kidney disease: Secondary | ICD-10-CM | POA: Diagnosis not present

## 2024-03-24 DIAGNOSIS — N390 Urinary tract infection, site not specified: Secondary | ICD-10-CM | POA: Diagnosis not present

## 2024-03-28 ENCOUNTER — Encounter (HOSPITAL_COMMUNITY): Payer: Self-pay

## 2024-03-28 ENCOUNTER — Other Ambulatory Visit: Payer: Self-pay

## 2024-03-28 ENCOUNTER — Emergency Department (HOSPITAL_COMMUNITY)

## 2024-03-28 ENCOUNTER — Inpatient Hospital Stay (HOSPITAL_COMMUNITY)
Admission: EM | Admit: 2024-03-28 | Discharge: 2024-04-07 | DRG: 291 | Disposition: A | Discharge status: Skilled Nursing Facility | Attending: Internal Medicine | Admitting: Internal Medicine

## 2024-03-28 DIAGNOSIS — I130 Hypertensive heart and chronic kidney disease with heart failure and stage 1 through stage 4 chronic kidney disease, or unspecified chronic kidney disease: Principal | ICD-10-CM | POA: Diagnosis present

## 2024-03-28 DIAGNOSIS — I272 Pulmonary hypertension, unspecified: Secondary | ICD-10-CM | POA: Diagnosis present

## 2024-03-28 DIAGNOSIS — Z961 Presence of intraocular lens: Secondary | ICD-10-CM | POA: Diagnosis present

## 2024-03-28 DIAGNOSIS — E041 Nontoxic single thyroid nodule: Secondary | ICD-10-CM | POA: Diagnosis not present

## 2024-03-28 DIAGNOSIS — K625 Hemorrhage of anus and rectum: Secondary | ICD-10-CM | POA: Diagnosis not present

## 2024-03-28 DIAGNOSIS — F0390 Unspecified dementia without behavioral disturbance: Secondary | ICD-10-CM | POA: Diagnosis present

## 2024-03-28 DIAGNOSIS — R4182 Altered mental status, unspecified: Secondary | ICD-10-CM | POA: Diagnosis not present

## 2024-03-28 DIAGNOSIS — I5033 Acute on chronic diastolic (congestive) heart failure: Secondary | ICD-10-CM | POA: Diagnosis not present

## 2024-03-28 DIAGNOSIS — J90 Pleural effusion, not elsewhere classified: Secondary | ICD-10-CM | POA: Diagnosis not present

## 2024-03-28 DIAGNOSIS — R627 Adult failure to thrive: Secondary | ICD-10-CM | POA: Diagnosis present

## 2024-03-28 DIAGNOSIS — Z950 Presence of cardiac pacemaker: Secondary | ICD-10-CM

## 2024-03-28 DIAGNOSIS — D631 Anemia in chronic kidney disease: Secondary | ICD-10-CM | POA: Diagnosis present

## 2024-03-28 DIAGNOSIS — R9389 Abnormal findings on diagnostic imaging of other specified body structures: Secondary | ICD-10-CM | POA: Diagnosis not present

## 2024-03-28 DIAGNOSIS — E1159 Type 2 diabetes mellitus with other circulatory complications: Secondary | ICD-10-CM | POA: Diagnosis present

## 2024-03-28 DIAGNOSIS — G9341 Metabolic encephalopathy: Secondary | ICD-10-CM | POA: Diagnosis present

## 2024-03-28 DIAGNOSIS — Z66 Do not resuscitate: Secondary | ICD-10-CM | POA: Diagnosis not present

## 2024-03-28 DIAGNOSIS — I083 Combined rheumatic disorders of mitral, aortic and tricuspid valves: Secondary | ICD-10-CM | POA: Diagnosis present

## 2024-03-28 DIAGNOSIS — J9811 Atelectasis: Secondary | ICD-10-CM | POA: Diagnosis not present

## 2024-03-28 DIAGNOSIS — Z8616 Personal history of COVID-19: Secondary | ICD-10-CM

## 2024-03-28 DIAGNOSIS — R918 Other nonspecific abnormal finding of lung field: Secondary | ICD-10-CM | POA: Diagnosis not present

## 2024-03-28 DIAGNOSIS — R0989 Other specified symptoms and signs involving the circulatory and respiratory systems: Secondary | ICD-10-CM | POA: Diagnosis not present

## 2024-03-28 DIAGNOSIS — N179 Acute kidney failure, unspecified: Secondary | ICD-10-CM | POA: Diagnosis not present

## 2024-03-28 DIAGNOSIS — I10 Essential (primary) hypertension: Secondary | ICD-10-CM | POA: Diagnosis present

## 2024-03-28 DIAGNOSIS — N1831 Chronic kidney disease, stage 3a: Secondary | ICD-10-CM | POA: Diagnosis not present

## 2024-03-28 DIAGNOSIS — E1122 Type 2 diabetes mellitus with diabetic chronic kidney disease: Secondary | ICD-10-CM | POA: Diagnosis present

## 2024-03-28 DIAGNOSIS — Z8249 Family history of ischemic heart disease and other diseases of the circulatory system: Secondary | ICD-10-CM

## 2024-03-28 DIAGNOSIS — K8020 Calculus of gallbladder without cholecystitis without obstruction: Secondary | ICD-10-CM | POA: Diagnosis not present

## 2024-03-28 DIAGNOSIS — I129 Hypertensive chronic kidney disease with stage 1 through stage 4 chronic kidney disease, or unspecified chronic kidney disease: Secondary | ICD-10-CM | POA: Diagnosis not present

## 2024-03-28 DIAGNOSIS — D696 Thrombocytopenia, unspecified: Secondary | ICD-10-CM | POA: Diagnosis present

## 2024-03-28 DIAGNOSIS — K219 Gastro-esophageal reflux disease without esophagitis: Secondary | ICD-10-CM | POA: Diagnosis present

## 2024-03-28 DIAGNOSIS — Z7189 Other specified counseling: Secondary | ICD-10-CM | POA: Diagnosis not present

## 2024-03-28 DIAGNOSIS — Z801 Family history of malignant neoplasm of trachea, bronchus and lung: Secondary | ICD-10-CM

## 2024-03-28 DIAGNOSIS — K648 Other hemorrhoids: Secondary | ICD-10-CM | POA: Diagnosis present

## 2024-03-28 DIAGNOSIS — E785 Hyperlipidemia, unspecified: Secondary | ICD-10-CM | POA: Diagnosis present

## 2024-03-28 DIAGNOSIS — Z880 Allergy status to penicillin: Secondary | ICD-10-CM

## 2024-03-28 DIAGNOSIS — I2510 Atherosclerotic heart disease of native coronary artery without angina pectoris: Secondary | ICD-10-CM | POA: Diagnosis present

## 2024-03-28 DIAGNOSIS — Z515 Encounter for palliative care: Secondary | ICD-10-CM | POA: Diagnosis not present

## 2024-03-28 DIAGNOSIS — R059 Cough, unspecified: Secondary | ICD-10-CM | POA: Diagnosis not present

## 2024-03-28 DIAGNOSIS — N400 Enlarged prostate without lower urinary tract symptoms: Secondary | ICD-10-CM | POA: Diagnosis present

## 2024-03-28 DIAGNOSIS — R410 Disorientation, unspecified: Secondary | ICD-10-CM | POA: Diagnosis not present

## 2024-03-28 DIAGNOSIS — R0602 Shortness of breath: Secondary | ICD-10-CM | POA: Diagnosis not present

## 2024-03-28 DIAGNOSIS — I517 Cardiomegaly: Secondary | ICD-10-CM | POA: Diagnosis not present

## 2024-03-28 DIAGNOSIS — Z789 Other specified health status: Secondary | ICD-10-CM | POA: Diagnosis not present

## 2024-03-28 DIAGNOSIS — I110 Hypertensive heart disease with heart failure: Secondary | ICD-10-CM | POA: Diagnosis not present

## 2024-03-28 DIAGNOSIS — Z720 Tobacco use: Secondary | ICD-10-CM

## 2024-03-28 DIAGNOSIS — Z1152 Encounter for screening for COVID-19: Secondary | ICD-10-CM | POA: Diagnosis not present

## 2024-03-28 DIAGNOSIS — Z9079 Acquired absence of other genital organ(s): Secondary | ICD-10-CM

## 2024-03-28 DIAGNOSIS — Z951 Presence of aortocoronary bypass graft: Secondary | ICD-10-CM

## 2024-03-28 DIAGNOSIS — K5730 Diverticulosis of large intestine without perforation or abscess without bleeding: Secondary | ICD-10-CM | POA: Diagnosis not present

## 2024-03-28 DIAGNOSIS — I442 Atrioventricular block, complete: Secondary | ICD-10-CM | POA: Diagnosis not present

## 2024-03-28 DIAGNOSIS — Z794 Long term (current) use of insulin: Secondary | ICD-10-CM | POA: Diagnosis not present

## 2024-03-28 DIAGNOSIS — M549 Dorsalgia, unspecified: Secondary | ICD-10-CM | POA: Diagnosis not present

## 2024-03-28 DIAGNOSIS — I509 Heart failure, unspecified: Secondary | ICD-10-CM | POA: Diagnosis not present

## 2024-03-28 DIAGNOSIS — Z888 Allergy status to other drugs, medicaments and biological substances status: Secondary | ICD-10-CM

## 2024-03-28 DIAGNOSIS — N390 Urinary tract infection, site not specified: Secondary | ICD-10-CM | POA: Diagnosis not present

## 2024-03-28 DIAGNOSIS — E11649 Type 2 diabetes mellitus with hypoglycemia without coma: Secondary | ICD-10-CM | POA: Diagnosis not present

## 2024-03-28 DIAGNOSIS — Z79899 Other long term (current) drug therapy: Secondary | ICD-10-CM

## 2024-03-28 LAB — CBC WITH DIFFERENTIAL/PLATELET
Abs Immature Granulocytes: 0.03 K/uL (ref 0.00–0.07)
Basophils Absolute: 0 K/uL (ref 0.0–0.1)
Basophils Relative: 0 %
Eosinophils Absolute: 0 K/uL (ref 0.0–0.5)
Eosinophils Relative: 1 %
HCT: 34.8 % — ABNORMAL LOW (ref 39.0–52.0)
Hemoglobin: 11.3 g/dL — ABNORMAL LOW (ref 13.0–17.0)
Immature Granulocytes: 1 %
Lymphocytes Relative: 19 %
Lymphs Abs: 1 K/uL (ref 0.7–4.0)
MCH: 30 pg (ref 26.0–34.0)
MCHC: 32.5 g/dL (ref 30.0–36.0)
MCV: 92.3 fL (ref 80.0–100.0)
Monocytes Absolute: 0.5 K/uL (ref 0.1–1.0)
Monocytes Relative: 9 %
Neutro Abs: 3.9 K/uL (ref 1.7–7.7)
Neutrophils Relative %: 70 %
Platelets: 107 K/uL — ABNORMAL LOW (ref 150–400)
RBC: 3.77 MIL/uL — ABNORMAL LOW (ref 4.22–5.81)
RDW: 15.5 % (ref 11.5–15.5)
WBC: 5.5 K/uL (ref 4.0–10.5)
nRBC: 0.4 % — ABNORMAL HIGH (ref 0.0–0.2)

## 2024-03-28 LAB — URINALYSIS, W/ REFLEX TO CULTURE (INFECTION SUSPECTED)
Bilirubin Urine: NEGATIVE
Glucose, UA: NEGATIVE mg/dL
Hgb urine dipstick: NEGATIVE
Ketones, ur: NEGATIVE mg/dL
Nitrite: NEGATIVE
Protein, ur: NEGATIVE mg/dL
Specific Gravity, Urine: 1.006 (ref 1.005–1.030)
pH: 7 (ref 5.0–8.0)

## 2024-03-28 LAB — PRO BRAIN NATRIURETIC PEPTIDE: Pro Brain Natriuretic Peptide: 3949 pg/mL — ABNORMAL HIGH (ref <300.0)

## 2024-03-28 LAB — COMPREHENSIVE METABOLIC PANEL WITH GFR
ALT: 20 U/L (ref 0–44)
AST: 27 U/L (ref 15–41)
Albumin: 4.5 g/dL (ref 3.5–5.0)
Alkaline Phosphatase: 108 U/L (ref 38–126)
Anion gap: 11 (ref 5–15)
BUN: 38 mg/dL — ABNORMAL HIGH (ref 8–23)
CO2: 25 mmol/L (ref 22–32)
Calcium: 10.6 mg/dL — ABNORMAL HIGH (ref 8.9–10.3)
Chloride: 100 mmol/L (ref 98–111)
Creatinine, Ser: 1.32 mg/dL — ABNORMAL HIGH (ref 0.61–1.24)
GFR, Estimated: 51 mL/min — ABNORMAL LOW (ref >60)
Glucose, Bld: 87 mg/dL (ref 70–99)
Potassium: 4.9 mmol/L (ref 3.5–5.1)
Sodium: 135 mmol/L (ref 135–145)
Total Bilirubin: 0.4 mg/dL (ref 0.0–1.2)
Total Protein: 7 g/dL (ref 6.5–8.1)

## 2024-03-28 LAB — TSH: TSH: 1.84 u[IU]/mL (ref 0.350–4.500)

## 2024-03-28 LAB — RESP PANEL BY RT-PCR (RSV, FLU A&B, COVID)  RVPGX2
Influenza A by PCR: NEGATIVE
Influenza B by PCR: NEGATIVE
Resp Syncytial Virus by PCR: NEGATIVE
SARS Coronavirus 2 by RT PCR: NEGATIVE

## 2024-03-28 LAB — TROPONIN T, HIGH SENSITIVITY
Troponin T High Sensitivity: 25 ng/L — ABNORMAL HIGH (ref 0–19)
Troponin T High Sensitivity: 23 ng/L — ABNORMAL HIGH (ref 0–19)

## 2024-03-28 LAB — VITAMIN B12: Vitamin B-12: 982 pg/mL — ABNORMAL HIGH (ref 180–914)

## 2024-03-28 LAB — TECHNOLOGIST SMEAR REVIEW

## 2024-03-28 LAB — CBG MONITORING, ED: Glucose-Capillary: 135 mg/dL — ABNORMAL HIGH (ref 70–99)

## 2024-03-28 LAB — AMMONIA: Ammonia: 32 umol/L (ref 9–35)

## 2024-03-28 LAB — MAGNESIUM: Magnesium: 2.3 mg/dL (ref 1.7–2.4)

## 2024-03-28 MED ORDER — INSULIN ASPART 100 UNIT/ML IJ SOLN: 0.0000 [IU] | Freq: Every day | INTRAMUSCULAR | Status: DC

## 2024-03-28 MED ORDER — SODIUM CHLORIDE 0.9% FLUSH
3.0000 mL | Freq: Two times a day (BID) | INTRAVENOUS | Status: DC
  Administered 2024-03-28 – 2024-04-07 (×19): 3 mL via INTRAVENOUS

## 2024-03-28 MED ORDER — FUROSEMIDE 10 MG/ML IJ SOLN
60.0000 mg | Freq: Two times a day (BID) | INTRAMUSCULAR | Status: DC
  Administered 2024-03-29 – 2024-03-30 (×3): 60 mg via INTRAVENOUS
  Filled 2024-03-28 (×3): qty 6

## 2024-03-28 MED ORDER — ASPIRIN 81 MG PO TBEC
81.0000 mg | DELAYED_RELEASE_TABLET | Freq: Every day | ORAL | Status: DC
  Administered 2024-03-29 – 2024-04-07 (×9): 81 mg via ORAL
  Filled 2024-03-28 (×10): qty 1

## 2024-03-28 MED ORDER — SIMVASTATIN 20 MG PO TABS
20.0000 mg | ORAL_TABLET | Freq: Every day | ORAL | Status: DC
  Administered 2024-03-29 – 2024-04-07 (×9): 20 mg via ORAL
  Filled 2024-03-28 (×10): qty 1

## 2024-03-28 MED ORDER — IOHEXOL 350 MG/ML SOLN
80.0000 mL | Freq: Once | INTRAVENOUS | Status: AC | PRN
  Administered 2024-03-28: 80 mL via INTRAVENOUS

## 2024-03-28 MED ORDER — METOPROLOL TARTRATE 50 MG PO TABS
50.0000 mg | ORAL_TABLET | Freq: Two times a day (BID) | ORAL | Status: DC
  Administered 2024-03-29 – 2024-04-02 (×8): 50 mg via ORAL
  Filled 2024-03-28 (×11): qty 1

## 2024-03-28 MED ORDER — SENNOSIDES-DOCUSATE SODIUM 8.6-50 MG PO TABS: 1.0000 | ORAL_TABLET | Freq: Every evening | ORAL | Status: DC | PRN

## 2024-03-28 MED ORDER — HEPARIN SODIUM (PORCINE) 5000 UNIT/ML IJ SOLN
5000.0000 [IU] | Freq: Three times a day (TID) | INTRAMUSCULAR | Status: DC
  Administered 2024-03-28 – 2024-04-05 (×22): 5000 [IU] via SUBCUTANEOUS
  Filled 2024-03-28 (×24): qty 1

## 2024-03-28 MED ORDER — ACETAMINOPHEN 650 MG RE SUPP: 650.0000 mg | Freq: Four times a day (QID) | RECTAL | Status: DC | PRN

## 2024-03-28 MED ORDER — TAMSULOSIN HCL 0.4 MG PO CAPS
0.4000 mg | ORAL_CAPSULE | Freq: Two times a day (BID) | ORAL | Status: DC
  Administered 2024-03-28 – 2024-04-07 (×18): 0.4 mg via ORAL
  Filled 2024-03-28 (×20): qty 1

## 2024-03-28 MED ORDER — ACETAMINOPHEN 325 MG PO TABS
650.0000 mg | ORAL_TABLET | Freq: Four times a day (QID) | ORAL | Status: DC | PRN
  Administered 2024-03-30 – 2024-03-31 (×2): 650 mg via ORAL
  Filled 2024-03-28 (×2): qty 2

## 2024-03-28 MED ORDER — LISINOPRIL 10 MG PO TABS
10.0000 mg | ORAL_TABLET | Freq: Two times a day (BID) | ORAL | Status: DC
  Administered 2024-03-29 – 2024-03-30 (×3): 10 mg via ORAL
  Filled 2024-03-28 (×4): qty 1

## 2024-03-28 MED ORDER — INSULIN ASPART 100 UNIT/ML IJ SOLN: 0.0000 [IU] | Freq: Three times a day (TID) | INTRAMUSCULAR | Status: DC

## 2024-03-28 MED ORDER — FUROSEMIDE 10 MG/ML IJ SOLN
80.0000 mg | Freq: Once | INTRAMUSCULAR | Status: AC
  Administered 2024-03-28: 80 mg via INTRAVENOUS
  Filled 2024-03-28: qty 8

## 2024-03-28 MED ORDER — PROCHLORPERAZINE EDISYLATE 10 MG/2ML IJ SOLN: 5.0000 mg | Freq: Four times a day (QID) | INTRAMUSCULAR | Status: DC | PRN

## 2024-03-28 NOTE — H&P (Signed)
 History and Physical    [name]:[id number] DOB: [birth year] DOA: 03/28/2024  PCP: [name], MD   Patient coming from: Home   Chief Complaint: SOB, confusion   HPI: Guy Roberson is a 89 y.o. male with medical history significant for hypertension, type 2 diabetes mellitus, CAD status post CABG, complete heart block with pacemaker, CKD 3A, chronic HFpEF, and BPH who presents with shortness of breath and increased confusion.  Patient is brought in by his son who is concerned that the patient has had worsening shortness of breath over the course of a month or so.  There has not been much cough and no fever has been documented.  Patient has had increased lower extremity swelling and was unable to get his shoes on today due to this.  Shortness of breath was worse last night and did not improve with albuterol  MDI.  Family also notes that the patient seems to be hallucinating and more confused than usual.  ED Course: Upon arrival to the ED, patient is found to be afebrile and saturating mid 90s on room air with stable BP.  Labs are most notable for creatinine 1.32, normal WBC, platelets 107,000, and proBNP 3949.  CTA chest is negative for PE but notable for small right pleural effusion and chronic RML volume loss.  There are no acute findings on head CT or CT of the abdomen and pelvis.  Patient was treated with 80 mg IV Lasix  in the ED.  Review of Systems:  All other systems reviewed and apart from HPI, are negative.  Past Medical History:  Diagnosis Date   Arthritis    CAD (coronary artery disease)    a.  90% LAD stenosis in 1995 treated with PTCA;  b. 09/2009 CABG x 4: LIMA->LAD, VG->Diag, VG->OM, VG->PDA;  c. 11/2011 Cath: 3VD, 4/4 patent grafts, EF 70%.   Chronic constipation    Colonic polyp    Complete heart block (HCC)    a. 11/2011 s/p MDT Adapta L ADDRL 1 Ser # [id number] H.   Complication of anesthesia    PROBLEMS VOIDING AFTER PREVIOUS SURGERIES   Diabetes mellitus  without complication (HCC)    NOT ON ANY DIABETIC MEDICATIONS   Fasting hyperglycemia    GERD (gastroesophageal reflux disease)    Hyperlipidemia    Hypertension    Pacemaker 11/2011   DR.  G.TAYLOR AND DR. DOROTHA RAKERS   Pseudophakia of both eyes    Seizures (HCC)    a. prior to diagnosis of heart block and syncope, this was in the differential and pt was briefly on Keppra , started by ER MD.   Syncope    a. 11/2011 18 second pauses noted on Event Monitor assoc w/ syncope   Trauma 1952   Abdominal and chest in 1952; multiple injuries including pelvic fracture, rib fractures and ruptured bladder   Urinary retention    PT HAS INDWELLING FOLEY CATHETER    Past Surgical History:  Procedure Laterality Date   BIOPSY  07/05/2019   Procedure: BIOPSY;  Surgeon: Golda Claudis PENNER, MD;  Location: [facility];  Service: Endoscopy;;  gastric   BLADDER SURGERY  1950'S   CATARACT EXTRACTION W/PHACO Right 11/22/2012   Procedure: CATARACT EXTRACTION PHACO AND INTRAOCULAR LENS PLACEMENT (IOC);  Surgeon: Oneil T. Roz, MD;  Location: [facility];  Service: Ophthalmology;  Laterality: Right;  CDE 10.27   CATARACT EXTRACTION W/PHACO Left 12/20/2012   Procedure: CATARACT EXTRACTION PHACO AND INTRAOCULAR LENS PLACEMENT (IOC);  Surgeon: Oneil  IVAR Platts, MD;  Location: [facility];  Service: Ophthalmology;  Laterality: Left;  CDE:6.67   COLONOSCOPY W/ POLYPECTOMY     CORONARY ARTERY BYPASS GRAFT  09/2009   4 vessels   ESOPHAGEAL DILATION  07/05/2019   Procedure: ESOPHAGEAL DILATION;  Surgeon: Golda Claudis PENNER, MD;  [facility]: [facility];  Service: Endoscopy;;   ESOPHAGOGASTRODUODENOSCOPY (EGD) WITH PROPOFOL  N/A 07/05/2019   Procedure: ESOPHAGOGASTRODUODENOSCOPY (EGD) WITH PROPOFOL ;  Surgeon: Golda Claudis PENNER, MD;  Location: [facility];  Service: Endoscopy;  Laterality: N/A;  110-office notified pt new arrival time 10:45am   FEMORAL HERNIA REPAIR     INSERTION OF SUPRAPUBIC CATHETER  04/04/2012   Procedure: INSERTION OF  SUPRAPUBIC CATHETER;  Surgeon: Oneil JAYSON Rafter, MD;  Location: [facility];  Service: Urology;  Laterality: N/A;   LEAD REVISION N/A 12/30/2011   Procedure: LEAD REVISION;  Surgeon: Danelle LELON Birmingham, MD;  Location: [facility] CATH LAB;  Service: Cardiovascular;  Laterality: N/A;   LEFT HEART CATHETERIZATION WITH CORONARY/GRAFT ANGIOGRAM  12/29/2011   Procedure: LEFT HEART CATHETERIZATION WITH EL BILE;  Surgeon: Ozell Fell, MD;  Location: [facility] CATH LAB;  Service: Cardiovascular;;   PACEMAKER PLACEMENT     PPM GENERATOR CHANGEOUT N/A 03/09/2024   Procedure: PPM GENERATOR CHANGEOUT;  Surgeon: Birmingham Danelle LELON, MD;  Location: [facility] INVASIVE CV LAB;  Service: Cardiovascular;  Laterality: N/A;   TEMPORARY PACEMAKER INSERTION N/A 12/29/2011   Procedure: TEMPORARY PACEMAKER INSERTION;  Surgeon: Ozell Fell, MD;  Location: [facility] CATH LAB;  Service: Cardiovascular;  Laterality: N/A;   TRANSURETHRAL PROSTATECTOMY WITH GYRUS INSTRUMENTS  04/04/2012   Procedure: TRANSURETHRAL PROSTATECTOMY WITH GYRUS INSTRUMENTS;  Surgeon: Mark C Ottelin, MD;  Location: [facility];  Service: Urology;  Laterality: N/A;  TURP with Gyrus and Suprapubic Tube Placement   YAG LASER APPLICATION Left 06/25/2015   Procedure: YAG LASER APPLICATION;  Surgeon: Oneil Platts, MD;  Location: [facility];  Service: Ophthalmology;  Laterality: Left;    Social History:   reports that he has never smoked. His smokeless tobacco use includes chew. He reports that he does not drink alcohol and does not use drugs.  Allergies  Allergen Reactions   Other Other (See Comments)    All sleep aids Makes him crazy, hallucinations, vivid dreams   Penicillins Other (See Comments)    Unknown  Childhood allergy Disorder of immune function    Family History  Problem Relation Age of Onset   Heart block Mother    Heart murmur Mother    Aneurysm Father    Coronary artery disease Brother        x3   Lung cancer Brother      Prior to Admission medications    Medication Sig Start Date End Date Taking? Authorizing Provider  acetaminophen  (TYLENOL ) 325 MG tablet Take 2 tablets (650 mg total) by mouth every 6 (six) hours as needed for mild pain (pain score 1-3) or fever (or Fever >/= 101). 02/02/24   Shahmehdi, Adriana LABOR, MD  albuterol  (VENTOLIN  HFA) 108 (90 Base) MCG/ACT inhaler Inhale 2 puffs into the lungs every 6 (six) hours as needed for wheezing or shortness of breath. Patient taking differently: Inhale 2 puffs into the lungs 2 (two) times daily. 02/16/24   Landy Barnie RAMAN, NP  cephALEXin  (KEFLEX ) 250 MG capsule Take 250 mg by mouth at bedtime.    [provider]  Cranberry 500 MG TABS Take 500 mg by mouth daily.    [provider]  furosemide  (LASIX ) 40 MG  tablet Take 1.5 tablets (60 mg total) by mouth daily. 02/16/24   Landy Barnie RAMAN, NP  guaiFENesin -dextromethorphan  (ROBITUSSIN DM) 100-10 MG/5ML syrup Take 10 mLs by mouth every 4 (four) hours as needed for cough. 02/02/24   Shahmehdi, Adriana LABOR, MD  insulin  aspart (NOVOLOG  FLEXPEN) 100 UNIT/ML FlexPen Inject 5 Units into the skin 3 (three) times daily before meals. Patient not taking: Reported on 03/08/2024 02/16/24   Landy Barnie RAMAN, NP  ipratropium-albuterol  (DUONEB) 0.5-2.5 (3) MG/3ML SOLN Take 3 mLs by nebulization every 6 (six) hours as needed. 02/16/24   Landy Barnie RAMAN, NP  lactobacillus acidophilus (BACID) TABS tablet Take 1 tablet by mouth daily.    [provider]  lisinopril  (ZESTRIL ) 10 MG tablet Take 10 mg by mouth 2 (two) times daily.    [provider]  metoprolol  tartrate (LOPRESSOR ) 50 MG tablet Take 1 tablet (50 mg total) by mouth 2 (two) times daily. 02/16/24   Landy Barnie RAMAN, NP  pantoprazole  (PROTONIX ) 20 MG tablet TAKE ONE TABLET (20MG  TOTAL) BY MOUTH TWO TIMES DAILY BEFORE A MEAL 02/16/24   Landy Barnie RAMAN, NP  phenazopyridine  (AZO URINARY PAIN RELIEF) 95 MG tablet Take 95 mg by mouth 2 (two) times daily as needed for pain (Burning).    [provider]  potassium chloride  SA (KLOR-CON  M) 20 MEQ tablet Take 1 tablet (20 mEq total) by mouth daily. 02/16/24   Landy Barnie RAMAN, NP  simvastatin  (ZOCOR ) 20 MG tablet Take 1 tablet (20 mg total) by mouth daily. 02/16/24   Landy Barnie RAMAN, NP  SURE COMFORT PEN NEEDLES 31G X 5 MM MISC USE AS DIRECTED UP TO 3ITIMES DAILY. 04/29/22   [provider]  tamsulosin  (FLOMAX ) 0.4 MG CAPS capsule Take 1 capsule (0.4 mg total) by mouth daily after supper. Patient taking differently: Take 0.4 mg by mouth 2 (two) times daily. 02/16/24   Landy Barnie RAMAN, NP    Physical Exam: Vitals:   03/28/24 1645 03/28/24 1800 03/28/24 1950 03/28/24 2000  BP: (!) 144/72 (!) 134/116 (!) 156/60 (!) 148/69  Pulse: 60 60 (!) 59 60  Resp: 12 12 12 12   Temp:  98.1 F (36.7 C)    TempSrc:      SpO2: 94% 92% 91% 91%  Weight:      Height:        Constitutional: NAD, no pallor or diaphoresis   Eyes: PERTLA, lids and conjunctivae normal ENMT: Mucous membranes are moist. Posterior pharynx clear of any exudate or lesions.   Neck: supple, no masses  Respiratory: no wheezing, no crackles. No accessory muscle use.  Cardiovascular: S1 & S2 heard, regular rate and rhythm. Pretibial pitting edema.  Abdomen: No tenderness, soft. Bowel sounds active.  Musculoskeletal: no clubbing / cyanosis. No joint deformity upper and lower extremities.   Skin: no significant rashes, lesions, ulcers. Warm, dry, well-perfused. Neurologic: CN 2-12 grossly intact. Moving all extremities. Alert and oriented to person, place, and situation.  Psychiatric: Calm. Cooperative.    Labs and Imaging on Admission: I have personally reviewed following labs and imaging studies  CBC: Recent Labs  Lab 03/28/24 1543  WBC 5.5  NEUTROABS 3.9  HGB 11.3*  HCT 34.8*  MCV 92.3  PLT 107*   Basic Metabolic Panel: Recent Labs  Lab 03/28/24 1543  NA 135  K 4.9  CL 100  CO2 25  GLUCOSE 87  BUN 38*  CREATININE 1.32*  CALCIUM  10.6*  MG  2.3   GFR: Estimated Creatinine  Clearance: 34.8 mL/min (A) (by C-G formula based on SCr of 1.32 mg/dL (H)). Liver Function Tests: Recent Labs  Lab 03/28/24 1543  AST 27  ALT 20  ALKPHOS 108  BILITOT 0.4  PROT 7.0  ALBUMIN 4.5   No results for input(s): LIPASE, AMYLASE in the last 168 hours. No results for input(s): AMMONIA in the last 168 hours. Coagulation Profile: No results for input(s): INR, PROTIME in the last 168 hours. Cardiac Enzymes: No results for input(s): CKTOTAL, CKMB, CKMBINDEX, TROPONINI in the last 168 hours. BNP (last 3 results) Recent Labs    03/28/24 1543  PROBNP 3,949.0*   HbA1C: No results for input(s): HGBA1C in the last 72 hours. CBG: No results for input(s): GLUCAP in the last 168 hours. Lipid Profile: No results for input(s): CHOL, HDL, LDLCALC, TRIG, CHOLHDL, LDLDIRECT in the last 72 hours. Thyroid Function Tests: No results for input(s): TSH, T4TOTAL, FREET4, T3FREE, THYROIDAB in the last 72 hours. Anemia Panel: No results for input(s): VITAMINB12, FOLATE, FERRITIN, TIBC, IRON, RETICCTPCT in the last 72 hours. Urine analysis:    Component Value Date/Time   COLORURINE YELLOW 03/28/2024 1705   APPEARANCEUR CLEAR 03/28/2024 1705   LABSPEC 1.006 03/28/2024 1705   PHURINE 7.0 03/28/2024 1705   GLUCOSEU NEGATIVE 03/28/2024 1705   HGBUR NEGATIVE 03/28/2024 1705   BILIRUBINUR NEGATIVE 03/28/2024 1705   KETONESUR NEGATIVE 03/28/2024 1705   PROTEINUR NEGATIVE 03/28/2024 1705   UROBILINOGEN 0.2 01/25/2012 0639   NITRITE NEGATIVE 03/28/2024 1705   LEUKOCYTESUR TRACE (A) 03/28/2024 1705   Sepsis Labs: @LABRCNTIP (procalcitonin:4,lacticidven:4) ) Recent Results (from the past 240 hours)  Resp panel by RT-PCR (RSV, Flu A&B, Covid) Anterior Nasal Swab     Status: None   Collection Time: 03/28/24  3:32 PM   Specimen: Anterior Nasal Swab  Result Value Ref Range Status   SARS Coronavirus 2 by  RT PCR NEGATIVE NEGATIVE Final    Comment: (NOTE) SARS-CoV-2 target nucleic acids are NOT DETECTED.  The SARS-CoV-2 RNA is generally detectable in upper respiratory specimens during the acute phase of infection. The lowest concentration of SARS-CoV-2 viral copies this assay can detect is 138 copies/mL. A negative result does not preclude SARS-Cov-2 infection and should not be used as the sole basis for treatment or other patient management decisions. A negative result may occur with  improper specimen collection/handling, submission of specimen other than nasopharyngeal swab, presence of viral mutation(s) within the areas targeted by this assay, and inadequate number of viral copies(<138 copies/mL). A negative result must be combined with clinical observations, patient history, and epidemiological information. The expected result is Negative.  Fact Sheet for Patients:  bloggercourse.com  Fact Sheet for Healthcare Providers:  seriousbroker.it  This test is no t yet approved or cleared by the [place]  FDA and  has been authorized for detection and/or diagnosis of SARS-CoV-2 by FDA under an Emergency Use Authorization (EUA). This EUA will remain  in effect (meaning this test can be used) for the duration of the COVID-19 declaration under Section 564(b)(1) of the Act, 21 U.S.C.section 360bbb-3(b)(1), unless the authorization is terminated  or revoked sooner.       Influenza A by PCR NEGATIVE NEGATIVE Final   Influenza B by PCR NEGATIVE NEGATIVE Final    Comment: (NOTE) The Xpert Xpress SARS-CoV-2/FLU/RSV plus assay is intended as an aid in the diagnosis of influenza from Nasopharyngeal swab specimens and should not be used as a sole basis for treatment. Nasal washings and aspirates are unacceptable for Xpert Xpress  SARS-CoV-2/FLU/RSV testing.  Fact Sheet for Patients: bloggercourse.com  Fact Sheet  for Healthcare Providers: seriousbroker.it  This test is not yet approved or cleared by the [place]  FDA and has been authorized for detection and/or diagnosis of SARS-CoV-2 by FDA under an Emergency Use Authorization (EUA). This EUA will remain in effect (meaning this test can be used) for the duration of the COVID-19 declaration under Section 564(b)(1) of the Act, 21 U.S.C. section 360bbb-3(b)(1), unless the authorization is terminated or revoked.     Resp Syncytial Virus by PCR NEGATIVE NEGATIVE Final    Comment: (NOTE) Fact Sheet for Patients: bloggercourse.com  Fact Sheet for Healthcare Providers: seriousbroker.it  This test is not yet approved or cleared by the [place]  FDA and has been authorized for detection and/or diagnosis of SARS-CoV-2 by FDA under an Emergency Use Authorization (EUA). This EUA will remain in effect (meaning this test can be used) for the duration of the COVID-19 declaration under Section 564(b)(1) of the Act, 21 U.S.C. section 360bbb-3(b)(1), unless the authorization is terminated or revoked.  Performed at [facility], [address], [place], [place] [address]      Radiological Exams on Admission: CT Head Wo Contrast Result Date: 03/28/2024 EXAM: CT HEAD WITHOUT CONTRAST 03/28/2024 07:12:17 PM TECHNIQUE: CT of the head was performed without the administration of intravenous contrast. Automated exposure control, iterative reconstruction, and/or weight based adjustment of the mA/kV was utilized to reduce the radiation dose to as low as reasonably achievable. COMPARISON: 01/27/2024 CLINICAL HISTORY: Mental status change, unknown cause. Pt arrived via POV c/o on-going SOB since the end of last month. Pt reports having a pacemaker generator changed on 10/9 and was told the SOB would resolve following this procedure, however family report the Pt has not made any  improvements. Pts family ; also report being advised to come to the ER for evaluation of a possible UTI. FINDINGS: BRAIN AND VENTRICLES: No acute hemorrhage. No evidence of acute infarct. Moderate parenchymal volume loss with proportional prominence of ventricles and sulci. Mild periventricular white matter changes, likely sequela of chronic small vessel ischemic disease. No extra-axial collection. No mass effect or midline shift. Calcified atherosclerotic plaque in cavernous/supraclinoid ICA and intradural vertebral arteries. ORBITS: Bilateral lens replacements noted. SINUSES: No acute abnormality. SOFT TISSUES AND SKULL: No acute soft tissue abnormality. No skull fracture. IMPRESSION: 1. No acute intracranial abnormality. Electronically signed by: Morene Hoard MD 03/28/2024 07:39 PM EDT RP Workstation: [id number]   CT Angio Chest PE W/Cm &/Or Wo Cm Result Date: 03/28/2024 EXAM: CTA CHEST PE WITHOUT AND WITH CONTRAST CT ABDOMEN AND PELVIS WITHOUT AND WITH CONTRAST 03/28/2024 07:12:17 PM TECHNIQUE: CTA of the chest was performed after the administration of 80 mL of iohexol  (OMNIPAQUE ) 350 MG/ML injection. Multiplanar reformatted images are provided for review. MIP images are provided for review. CT of the abdomen and pelvis was performed without and with the administration of intravenous contrast. Automated exposure control, iterative reconstruction, and/or weight based adjustment of the mA/kV was utilized to reduce the radiation dose to as low as reasonably achievable. COMPARISON: None available. CLINICAL HISTORY: FINDINGS: CHEST: PULMONARY ARTERIES: Pulmonary arteries are adequately opacified for evaluation. No intraluminal filling defect to suggest pulmonary embolism. Main pulmonary artery is normal in caliber. MEDIASTINUM: Heart is enlarged. There are atherosclerotic calcifications of the aorta. Left-sided pacemaker is present. Thyroid gland is significantly enlarged, left greater than right and  diffusely heterogeneous. Left thyroid nodule measures 2.7 cm similar to the prior study. No mediastinal lymphadenopathy. The pericardium  demonstrates no acute abnormality. LUNGS AND PLEURA: There is a small right pleural effusion. Volume loss and collapse of the right middle lobe appears unchanged from prior. Stable elevation of the right hemidiaphragm with some right basilar atelectasis. No pneumothorax. SOFT TISSUES AND BONES: Sternotomy wires are present. The bones are diffusely osteopenic. ABDOMEN AND PELVIS: LIVER: The liver is unremarkable. GALLBLADDER AND BILE DUCTS: Gallstones are present. No biliary ductal dilatation. SPLEEN: Spleen demonstrates no acute abnormality. PANCREAS: Pancreas demonstrates no acute abnormality. ADRENAL GLANDS: Adrenal glands demonstrate no acute abnormality. KIDNEYS, URETERS AND BLADDER: No stones in the kidneys or ureters. No hydronephrosis. No perinephric or periureteral stranding. Urinary bladder is unremarkable. GI AND BOWEL: There is colonic diverticulosis. Stomach and duodenal sweep demonstrate no acute abnormality. There is no bowel obstruction. No abnormal bowel wall thickening or distension. REPRODUCTIVE: Reproductive organs are unremarkable. PERITONEUM AND RETROPERITONEUM: There is trace free fluid in the left upper quadrant. No free air. LYMPH NODES: No lymphadenopathy. BONES AND SOFT TISSUES: The visualized bones are diffusely osteopenic. No focal soft tissue abnormality. IMPRESSION: 1. No evidence of pulmonary embolism. 2. Small right pleural effusion. 3. Chronic right middle lobe volume loss/collapse and elevated right hemidiaphragm with right basilar atelectasis, unchanged from prior. 4. Enlarged thyroid gland with a 2.7 cm left thyroid nodule, similar to prior study. Follow-up thyroid ultrasound recommended if not performed. 5. Cardiomegaly. 6. Gallstones and colonic diverticulosis without evidence of diverticulitis. Electronically signed by: Greig Pique MD  03/28/2024 07:33 PM EDT RP Workstation: [id number]   CT ABDOMEN PELVIS W CONTRAST Result Date: 03/28/2024 EXAM: CTA CHEST PE WITHOUT AND WITH CONTRAST CT ABDOMEN AND PELVIS WITHOUT AND WITH CONTRAST 03/28/2024 07:12:17 PM TECHNIQUE: CTA of the chest was performed after the administration of 80 mL of iohexol  (OMNIPAQUE ) 350 MG/ML injection. Multiplanar reformatted images are provided for review. MIP images are provided for review. CT of the abdomen and pelvis was performed without and with the administration of intravenous contrast. Automated exposure control, iterative reconstruction, and/or weight based adjustment of the mA/kV was utilized to reduce the radiation dose to as low as reasonably achievable. COMPARISON: None available. CLINICAL HISTORY: FINDINGS: CHEST: PULMONARY ARTERIES: Pulmonary arteries are adequately opacified for evaluation. No intraluminal filling defect to suggest pulmonary embolism. Main pulmonary artery is normal in caliber. MEDIASTINUM: Heart is enlarged. There are atherosclerotic calcifications of the aorta. Left-sided pacemaker is present. Thyroid gland is significantly enlarged, left greater than right and diffusely heterogeneous. Left thyroid nodule measures 2.7 cm similar to the prior study. No mediastinal lymphadenopathy. The pericardium demonstrates no acute abnormality. LUNGS AND PLEURA: There is a small right pleural effusion. Volume loss and collapse of the right middle lobe appears unchanged from prior. Stable elevation of the right hemidiaphragm with some right basilar atelectasis. No pneumothorax. SOFT TISSUES AND BONES: Sternotomy wires are present. The bones are diffusely osteopenic. ABDOMEN AND PELVIS: LIVER: The liver is unremarkable. GALLBLADDER AND BILE DUCTS: Gallstones are present. No biliary ductal dilatation. SPLEEN: Spleen demonstrates no acute abnormality. PANCREAS: Pancreas demonstrates no acute abnormality. ADRENAL GLANDS: Adrenal glands demonstrate no acute  abnormality. KIDNEYS, URETERS AND BLADDER: No stones in the kidneys or ureters. No hydronephrosis. No perinephric or periureteral stranding. Urinary bladder is unremarkable. GI AND BOWEL: There is colonic diverticulosis. Stomach and duodenal sweep demonstrate no acute abnormality. There is no bowel obstruction. No abnormal bowel wall thickening or distension. REPRODUCTIVE: Reproductive organs are unremarkable. PERITONEUM AND RETROPERITONEUM: There is trace free fluid in the left upper quadrant. No free air. LYMPH NODES: No  lymphadenopathy. BONES AND SOFT TISSUES: The visualized bones are diffusely osteopenic. No focal soft tissue abnormality. IMPRESSION: 1. No evidence of pulmonary embolism. 2. Small right pleural effusion. 3. Chronic right middle lobe volume loss/collapse and elevated right hemidiaphragm with right basilar atelectasis, unchanged from prior. 4. Enlarged thyroid gland with a 2.7 cm left thyroid nodule, similar to prior study. Follow-up thyroid ultrasound recommended if not performed. 5. Cardiomegaly. 6. Gallstones and colonic diverticulosis without evidence of diverticulitis. Electronically signed by: Greig Pique MD 03/28/2024 07:33 PM EDT RP Workstation: [id number]   DG Chest 2 View Result Date: 03/28/2024 CLINICAL DATA:  Shortness of breath. EXAM: CHEST - 2 VIEW COMPARISON:  01/29/2024 FINDINGS: Prior median sternotomy. Left-sided pacemaker unchanged. Moderate stable elevation of the right hemidiaphragm. Linear density posterior right base likely atelectasis. Left lung is clear. Stable borderline cardiomegaly. Remainder of the exam is unchanged. IMPRESSION: 1. Linear density posterior right base likely atelectasis. 2. Stable borderline cardiomegaly. Electronically Signed   By: Toribio Agreste M.D.   On: 03/28/2024 15:55    EKG: Independently reviewed. Paced rhythm.   Assessment/Plan   1. Acute on chronic HFpEF  - EF was 55-60% on echo from September 2024  - Diurese with IV Lasix ,  monitor weight and I/Os  2. Acute encephalopathy - Family is concerned that patient has been more disoriented lately  - No acute findings on head CT, no infectious s/s  - Check ammonia, RPR, B12, and TSH, use delirium precautions   3. Thrombocytopenia  - Platelets 107,000 on admission, down from 112k on 03/09/24  - LFTs are normal; WBC is normal and there is a mild normocytic anemia   - Request smear review, repeat CBC in am    4. CAD  - Hx of CABG in 2011  - No anginal symptoms  - Continue Zocor , ASA, and metoprolol     5. Type II DM  - A1c was 6.7% in August 2025  - Check CBGs and use low-intensity SSI for now    6. CKD 3A  - Appears close to baseline  - Renally-dose medications     DVT prophylaxis: sq heparin   Code Status: DNR/DNI Level of Care: Level of care: Telemetry Family Communication: Son at bedside  Disposition Plan:  Patient is from: home  Anticipated d/c is to: TBD Anticipated d/c date is: 03/30/24  Patient currently: Pending improved volume status, PT eval, disposition planning  Consults called: None  Admission status: Inpatient     Evalene GORMAN Sprinkles, MD [facility]  03/28/2024, 8:39 PM

## 2024-03-28 NOTE — ED Provider Notes (Signed)
 [place] EMERGENCY DEPARTMENT AT [facility] Provider Note   CSN: [id number] Arrival date & time: 03/28/24  1513     Patient presents with: Shortness of Breath   Guy Roberson is a 89 y.o. male.  He has a history of diabetes coronary disease CHF.  He recently had his pacemaker battery replaced.  Was having some shortness of breath then but it was felt that he was can improve once this was done.  He said he is having some discomfort in his abdomen and in his testicles.  Family states he has had some intermittent confusion similar to when he gets a UTI.  Does also endorse some dysuria.  They have also noticed he is having more shortness of breath especially with exertion.  Has been compliant with his diuretics.  No fever nausea vomiting.  {Add pertinent medical, surgical, social history, OB history to YEP:67052} The history is provided by the patient.  Shortness of Breath Severity:  Moderate Onset quality:  Gradual Duration:  1 month Timing:  Constant Progression:  Worsening Relieved by:  Nothing Worsened by:  Activity Ineffective treatments:  Rest and diuretics Associated symptoms: abdominal pain and cough   Associated symptoms: no chest pain, no fever, no hemoptysis and no sputum production        Prior to Admission medications   Medication Sig Start Date End Date Taking? Authorizing Provider  acetaminophen  (TYLENOL ) 325 MG tablet Take 2 tablets (650 mg total) by mouth every 6 (six) hours as needed for mild pain (pain score 1-3) or fever (or Fever >/= 101). 02/02/24   Shahmehdi, Adriana LABOR, MD  albuterol  (VENTOLIN  HFA) 108 (90 Base) MCG/ACT inhaler Inhale 2 puffs into the lungs every 6 (six) hours as needed for wheezing or shortness of breath. Patient taking differently: Inhale 2 puffs into the lungs 2 (two) times daily. 02/16/24   Landy Barnie RAMAN, NP  cephALEXin  (KEFLEX ) 250 MG capsule Take 250 mg by mouth at bedtime.    [provider]  Cranberry 500 MG TABS  Take 500 mg by mouth daily.    [provider]  furosemide  (LASIX ) 40 MG tablet Take 1.5 tablets (60 mg total) by mouth daily. 02/16/24   Landy Barnie RAMAN, NP  guaiFENesin -dextromethorphan  (ROBITUSSIN DM) 100-10 MG/5ML syrup Take 10 mLs by mouth every 4 (four) hours as needed for cough. 02/02/24   Shahmehdi, Adriana LABOR, MD  insulin  aspart (NOVOLOG  FLEXPEN) 100 UNIT/ML FlexPen Inject 5 Units into the skin 3 (three) times daily before meals. Patient not taking: Reported on 03/08/2024 02/16/24   Landy Barnie RAMAN, NP  ipratropium-albuterol  (DUONEB) 0.5-2.5 (3) MG/3ML SOLN Take 3 mLs by nebulization every 6 (six) hours as needed. 02/16/24   Landy Barnie RAMAN, NP  lactobacillus acidophilus (BACID) TABS tablet Take 1 tablet by mouth daily.    [provider]  lisinopril  (ZESTRIL ) 10 MG tablet Take 10 mg by mouth 2 (two) times daily.    [provider]  metoprolol  tartrate (LOPRESSOR ) 50 MG tablet Take 1 tablet (50 mg total) by mouth 2 (two) times daily. 02/16/24   Landy Barnie RAMAN, NP  pantoprazole  (PROTONIX ) 20 MG tablet TAKE ONE TABLET (20MG  TOTAL) BY MOUTH TWO TIMES DAILY BEFORE A MEAL 02/16/24   Landy Barnie RAMAN, NP  phenazopyridine  (AZO URINARY PAIN RELIEF) 95 MG tablet Take 95 mg by mouth 2 (two) times daily as needed for pain (Burning).    [provider]  potassium chloride  SA (KLOR-CON  M) 20 MEQ tablet Take  1 tablet (20 mEq total) by mouth daily. 02/16/24   Landy Barnie RAMAN, NP  simvastatin  (ZOCOR ) 20 MG tablet Take 1 tablet (20 mg total) by mouth daily. 02/16/24   Landy Barnie RAMAN, NP  SURE COMFORT PEN NEEDLES 31G X 5 MM MISC USE AS DIRECTED UP TO 3ITIMES DAILY. 04/29/22   [provider]  tamsulosin  (FLOMAX ) 0.4 MG CAPS capsule Take 1 capsule (0.4 mg total) by mouth daily after supper. Patient taking differently: Take 0.4 mg by mouth 2 (two) times daily. 02/16/24   Landy Barnie RAMAN, NP    Allergies: Other and Penicillins    Review of Systems  Constitutional:   Negative for fever.  Respiratory:  Positive for cough and shortness of breath. Negative for hemoptysis and sputum production.   Cardiovascular:  Positive for leg swelling. Negative for chest pain.  Gastrointestinal:  Positive for abdominal pain.  Genitourinary:  Positive for dysuria.  Psychiatric/Behavioral:  Positive for confusion and hallucinations.     Updated Vital Signs BP (!) 142/55   Pulse (!) 59   Temp 97.6 F (36.4 C) (Temporal)   Resp 17   Ht 5' 7 (1.702 m)   Wt 78.9 kg   SpO2 97%   BMI 27.25 kg/m   Physical Exam Vitals and nursing note reviewed.  Constitutional:      General: He is not in acute distress.    Appearance: He is well-developed.  HENT:     Head: Normocephalic and atraumatic.  Eyes:     Conjunctiva/sclera: Conjunctivae normal.  Cardiovascular:     Rate and Rhythm: Normal rate and regular rhythm.     Heart sounds: No murmur heard. Pulmonary:     Effort: Accessory muscle usage present. No respiratory distress.     Breath sounds: Wheezing present.  Abdominal:     Tenderness: There is no abdominal tenderness. There is no guarding or rebound.  Musculoskeletal:        General: No swelling.     Cervical back: Neck supple.     Right lower leg: Edema present.     Left lower leg: Edema present.  Skin:    General: Skin is warm and dry.     Capillary Refill: Capillary refill takes less than 2 seconds.  Neurological:     General: No focal deficit present.     Mental Status: He is alert.     (all labs ordered are listed, but only abnormal results are displayed) Labs Reviewed  RESP PANEL BY RT-PCR (RSV, FLU A&B, COVID)  [id number]  CBC WITH DIFFERENTIAL/PLATELET  COMPREHENSIVE METABOLIC PANEL WITH GFR  PRO BRAIN NATRIURETIC PEPTIDE  MAGNESIUM   URINALYSIS, W/ REFLEX TO CULTURE (INFECTION SUSPECTED)  TROPONIN T, HIGH SENSITIVITY    EKG: EKG Interpretation Date/Time:  Tuesday March 28 2024 15:25:52 EDT Ventricular Rate:  60 PR Interval:  214 QRS  Duration:  174 QT Interval:  476 QTC Calculation: 476 R Axis:   269  Text Interpretation: AV dual-paced rhythm with prolonged AV conduction Abnormal ECG When compared with ECG of 27-Jan-2024 08:37, No significant change since last tracing Confirmed by Towana Sharper [phone]) on 03/28/2024 3:32:38 PM  Radiology: No results found.  {Document cardiac monitor, telemetry assessment procedure when appropriate:32947} Procedures   Medications Ordered in the ED - No data to display  Clinical Course as of 03/28/24 1548  Tue Mar 28, 2024  1539 Chest x-ray interpreted by me as pacemaker in place, elevated right hemidiaphragm, possible interstitial edema.  Awaiting radiology reading. [MB]  Clinical Course User Index [MB] Towana Ozell BROCKS, MD   {Click here for ABCD2, HEART and other calculators REFRESH Note before signing:1}                              Medical Decision Making Amount and/or Complexity of Data Reviewed Labs: ordered. Radiology: ordered.   This patient complains of ***; this involves an extensive number of treatment Options and is a complaint that carries with it a high risk of complications and morbidity. The differential includes ***  I ordered, reviewed and interpreted labs, which included *** I ordered medication *** and reviewed PMP when indicated. I ordered imaging studies which included *** and I independently    visualized and interpreted imaging which showed *** Additional history obtained from *** Previous records obtained and reviewed *** I consulted *** and discussed lab and imaging findings and discussed disposition.  Cardiac monitoring reviewed, *** Social determinants considered, *** Critical Interventions: ***  After the interventions stated above, I reevaluated the patient and found *** Admission and further testing considered, ***   {Document critical care time when appropriate  Document review of labs and clinical decision tools ie CHADS2VASC2,  etc  Document your independent review of radiology images and any outside records  Document your discussion with family members, caretakers and with consultants  Document social determinants of health affecting pt's care  Document your decision making why or why not admission, treatments were needed:32947:::1}   Final diagnoses:  None    ED Discharge Orders     None

## 2024-03-28 NOTE — ED Notes (Signed)
 Patient transported to X-ray

## 2024-03-28 NOTE — ED Triage Notes (Signed)
 Pt arrived via POV c/o on-going SOB since the end of last month. Pt reports having a pacemaker generator changed on 10/9 and was told the SOB would resolve following this procedure, however family report the Pt has not made any improvements. Pts family also report being advised to come to the ER for evaluation of a possible UTI.

## 2024-03-29 ENCOUNTER — Inpatient Hospital Stay (HOSPITAL_COMMUNITY)

## 2024-03-29 ENCOUNTER — Other Ambulatory Visit (HOSPITAL_COMMUNITY): Payer: Self-pay | Admitting: *Deleted

## 2024-03-29 DIAGNOSIS — I5033 Acute on chronic diastolic (congestive) heart failure: Secondary | ICD-10-CM | POA: Diagnosis not present

## 2024-03-29 LAB — GLUCOSE, CAPILLARY
Glucose-Capillary: 61 mg/dL — ABNORMAL LOW (ref 70–99)
Glucose-Capillary: 178 mg/dL — ABNORMAL HIGH (ref 70–99)
Glucose-Capillary: 72 mg/dL (ref 70–99)
Glucose-Capillary: 73 mg/dL (ref 70–99)
Glucose-Capillary: 84 mg/dL (ref 70–99)
Glucose-Capillary: 95 mg/dL (ref 70–99)

## 2024-03-29 LAB — ECHOCARDIOGRAM COMPLETE
AR max vel: 2.71 cm2
AV Area VTI: 2.24 cm2
AV Area mean vel: 2.54 cm2
AV Mean grad: 2.6 mmHg
AV Peak grad: 5.1 mmHg
Ao pk vel: 1.13 m/s
Est EF: 50
Height: 67 in
MV M vel: 4.22 m/s
MV Peak grad: 71.2 mmHg
P 1/2 time: 299 ms
Radius: 1.1 cm
S' Lateral: 3.2 cm
Weight: 2828.94 [oz_av]

## 2024-03-29 LAB — CBC
HCT: 33.5 % — ABNORMAL LOW (ref 39.0–52.0)
Hemoglobin: 10.8 g/dL — ABNORMAL LOW (ref 13.0–17.0)
MCH: 30.1 pg (ref 26.0–34.0)
MCHC: 32.2 g/dL (ref 30.0–36.0)
MCV: 93.3 fL (ref 80.0–100.0)
Platelets: 101 K/uL — ABNORMAL LOW (ref 150–400)
RBC: 3.59 MIL/uL — ABNORMAL LOW (ref 4.22–5.81)
RDW: 15.6 % — ABNORMAL HIGH (ref 11.5–15.5)
WBC: 5.5 K/uL (ref 4.0–10.5)
nRBC: 0 % (ref 0.0–0.2)

## 2024-03-29 LAB — MAGNESIUM: Magnesium: 2.3 mg/dL (ref 1.7–2.4)

## 2024-03-29 LAB — PROTIME-INR
INR: 1.1 (ref 0.8–1.2)
Prothrombin Time: 14.9 s (ref 11.4–15.2)

## 2024-03-29 LAB — MRSA NEXT GEN BY PCR, NASAL: MRSA by PCR Next Gen: NOT DETECTED

## 2024-03-29 LAB — BASIC METABOLIC PANEL WITH GFR
Anion gap: 12 (ref 5–15)
BUN: 39 mg/dL — ABNORMAL HIGH (ref 8–23)
CO2: 27 mmol/L (ref 22–32)
Calcium: 10.7 mg/dL — ABNORMAL HIGH (ref 8.9–10.3)
Chloride: 100 mmol/L (ref 98–111)
Creatinine, Ser: 1.46 mg/dL — ABNORMAL HIGH (ref 0.61–1.24)
GFR, Estimated: 45 mL/min — ABNORMAL LOW (ref >60)
Glucose, Bld: 72 mg/dL (ref 70–99)
Potassium: 4.1 mmol/L (ref 3.5–5.1)
Sodium: 139 mmol/L (ref 135–145)

## 2024-03-29 LAB — RPR: RPR Ser Ql: NONREACTIVE

## 2024-03-29 MED ORDER — GUAIFENESIN-DM 100-10 MG/5ML PO SYRP
5.0000 mL | ORAL_SOLUTION | ORAL | Status: DC | PRN
  Administered 2024-03-29: 5 mL via ORAL
  Filled 2024-03-29: qty 5

## 2024-03-29 MED ORDER — CHLORHEXIDINE GLUCONATE CLOTH 2 % EX PADS
6.0000 | MEDICATED_PAD | Freq: Every day | CUTANEOUS | Status: DC
  Administered 2024-03-29 – 2024-04-01 (×4): 6 via TOPICAL

## 2024-03-29 MED ORDER — DEXTROSE 50 % IV SOLN
INTRAVENOUS | Status: AC
  Administered 2024-03-29: 50 mL
  Filled 2024-03-29: qty 50

## 2024-03-29 MED ORDER — DEXTROSE 5 % IV SOLN: INTRAVENOUS | Status: AC

## 2024-03-29 NOTE — Progress Notes (Signed)
 Patient placed on warming blanket.  Continuous rectal thermometer in place.

## 2024-03-29 NOTE — Progress Notes (Addendum)
 PROGRESS NOTE    Guy Roberson  FMW:[id number] DOB: [birth year] DOA: 03/28/2024 PCP: [name], MD   Brief Narrative:   Guy Roberson is a 89 y.o. male with medical history significant for hypertension, type 2 diabetes mellitus, CAD status post CABG, complete heart block with pacemaker, CKD 3A, chronic HFpEF, and BPH who presents with shortness of breath and increased confusion.   Patient is brought in by his son who is concerned that the patient has had worsening shortness of breath over the course of a month or so.  There has not been much cough and no fever has been documented.  Patient has had increased lower extremity swelling and was unable to get his shoes on today due to this.  Shortness of breath was worse last night and did not improve with albuterol  MDI.  Family also notes that the patient seems to be hallucinating and more confused than usual.   ED Course: Upon arrival to the ED, patient is found to be afebrile and saturating mid 90s on room air with stable BP.  Labs are most notable for creatinine 1.32, normal WBC, platelets 107,000, and proBNP 3949.  CTA chest is negative for PE but notable for small right pleural effusion and chronic RML volume loss.  There are no acute findings on head CT or CT of the abdomen and pelvis.   Patient was treated with 80 mg IV Lasix  in the ED.   Patient was admitted for CHF exacerbation.   Assessment & Plan:   Principal Problem:   Acute on chronic heart failure with preserved ejection fraction (HFpEF) (HCC) Active Problems:   Type 2 diabetes mellitus with other circulatory complications (HCC)   Essential hypertension   CAD (coronary artery disease)   CKD stage 3a, GFR 45-59 ml/min (HCC)   Acute metabolic encephalopathy   Thrombocytopenia  1. Acute on chronic HFpEF  - EF was 55-60% on echo from September 2024.  Will update echocardiogram. - Will continue to diurese with IV Lasix  60 twice daily, monitor weight, intake and output, daily  electrolytes, BMP - EKG with paced rhythm   2. Acute encephalopathy - Family is concerned that patient has been more disoriented lately  - No acute findings on head CT, no infectious s/s, CT chest abdomen and pelvis unremarkable to suggest infection.  UA is negative too -TSH, ammonia within normal limits, RPR, B12 pending. -Follow delirium precautions.  3.  Hypercalcemia: Calcium  10.7.  Will obtain daily labs.  Obtain ionized calcium .  Avoiding IV fluid due to CHF.   3. Thrombocytopenia  - Platelets 107,000 on admission, down from 112k on 03/09/24  - LFTs are normal; WBC is normal and there is a mild normocytic anemia   - Request smear review, repeat CBC in am     4. CAD  - Hx of CABG in 2011  - No anginal symptoms  - Continue Zocor , ASA, and metoprolol      5. Type II DM  - A1c was 6.7% in August 2025  - Check CBGs and use low-intensity SSI for now     6. CKD 3A  - Appears close to baseline  - Renally-dose medications       DVT prophylaxis: sq heparin   Code Status: DNR/DNI Level of Care: Level of care: Telemetry Family Communication: Son, daughter at bedside  Disposition Plan:  Patient is from: home  Anticipated d/c is to: TBD Patient currently: Pending improved volume status, PT eval, disposition planning  Consults called: None  Admission status: Inpatient     Subjective: Patient seen and examined at the bedside.  He is somnolent.  He is arousable and was able to tell his full name and date of birth.  Fell back to sleep.  He denies any chest pain or shortness of breath.  On room air.  Vital signs are stable.  Paced rhythm on telemetry.  Objective: Vitals:   03/29/24 0713 03/29/24 0720 03/29/24 0830 03/29/24 1000  BP: (!) 132/55  (!) 119/54 (!) 112/46  Pulse: 69 61 71 61  Resp: (!) 21 14 18 15   Temp:  (!) 95.5 F (35.3 C)  (!) 97 F (36.1 C)  TempSrc:  Rectal  Rectal  SpO2: 95% 95% 95% 95%  Weight:      Height:        Intake/Output Summary (Last 24 hours) at  03/29/2024 1112 Last data filed at 03/29/2024 0904 Gross per 24 hour  Intake 0 ml  Output 2300 ml  Net -2300 ml   Filed Weights   03/28/24 1522 03/29/24 0500  Weight: 78.9 kg 80.2 kg    Examination:  General: Lethargic, arousable Chest: Diminished bilaterally, bilateral crackles CVs: S1, S2, no murmur, regular rhythm Abdomen: Soft, nontender, no organomegaly Extremities: Bilateral peripheral edema   Data Reviewed: I have personally reviewed following labs and imaging studies  CBC: Recent Labs  Lab 03/28/24 1543 03/29/24 0445  WBC 5.5 5.5  NEUTROABS 3.9  --   HGB 11.3* 10.8*  HCT 34.8* 33.5*  MCV 92.3 93.3  PLT 107* 101*   Basic Metabolic Panel: Recent Labs  Lab 03/28/24 1543 03/29/24 0445  NA 135 139  K 4.9 4.1  CL 100 100  CO2 25 27  GLUCOSE 87 72  BUN 38* 39*  CREATININE 1.32* 1.46*  CALCIUM  10.6* 10.7*  MG 2.3 2.3   GFR: Estimated Creatinine Clearance: 34.1 mL/min (A) (by C-G formula based on SCr of 1.46 mg/dL (H)). Liver Function Tests: Recent Labs  Lab 03/28/24 1543  AST 27  ALT 20  ALKPHOS 108  BILITOT 0.4  PROT 7.0  ALBUMIN 4.5   No results for input(s): LIPASE, AMYLASE in the last 168 hours. Recent Labs  Lab 03/28/24 2045  AMMONIA 32   Coagulation Profile: Recent Labs  Lab 03/29/24 0445  INR 1.1   Cardiac Enzymes: No results for input(s): CKTOTAL, CKMB, CKMBINDEX, TROPONINI in the last 168 hours. BNP (last 3 results) Recent Labs    03/28/24 1543  PROBNP 3,949.0*   HbA1C: No results for input(s): HGBA1C in the last 72 hours. CBG: Recent Labs  Lab 03/28/24 2133 03/29/24 0728 03/29/24 0754  GLUCAP 135* 61* 178*   Lipid Profile: No results for input(s): CHOL, HDL, LDLCALC, TRIG, CHOLHDL, LDLDIRECT in the last 72 hours. Thyroid Function Tests: Recent Labs    03/28/24 1543  TSH 1.840   Anemia Panel: Recent Labs    03/28/24 1543  VITAMINB12 982*   Sepsis Labs: No results for  input(s): PROCALCITON, LATICACIDVEN in the last 168 hours.  Recent Results (from the past 240 hours)  Resp panel by RT-PCR (RSV, Flu A&B, Covid) Anterior Nasal Swab     Status: None   Collection Time: 03/28/24  3:32 PM   Specimen: Anterior Nasal Swab  Result Value Ref Range Status   SARS Coronavirus 2 by RT PCR NEGATIVE NEGATIVE Final    Comment: (NOTE) SARS-CoV-2 target nucleic acids are NOT DETECTED.  The SARS-CoV-2 RNA is generally detectable in upper respiratory specimens during the acute  phase of infection. The lowest concentration of SARS-CoV-2 viral copies this assay can detect is 138 copies/mL. A negative result does not preclude SARS-Cov-2 infection and should not be used as the sole basis for treatment or other patient management decisions. A negative result may occur with  improper specimen collection/handling, submission of specimen other than nasopharyngeal swab, presence of viral mutation(s) within the areas targeted by this assay, and inadequate number of viral copies(<138 copies/mL). A negative result must be combined with clinical observations, patient history, and epidemiological information. The expected result is Negative.  Fact Sheet for Patients:  bloggercourse.com  Fact Sheet for Healthcare Providers:  seriousbroker.it  This test is no t yet approved or cleared by the [place]  FDA and  has been authorized for detection and/or diagnosis of SARS-CoV-2 by FDA under an Emergency Use Authorization (EUA). This EUA will remain  in effect (meaning this test can be used) for the duration of the COVID-19 declaration under Section 564(b)(1) of the Act, 21 U.S.C.section 360bbb-3(b)(1), unless the authorization is terminated  or revoked sooner.       Influenza A by PCR NEGATIVE NEGATIVE Final   Influenza B by PCR NEGATIVE NEGATIVE Final    Comment: (NOTE) The Xpert Xpress SARS-CoV-2/FLU/RSV plus assay is  intended as an aid in the diagnosis of influenza from Nasopharyngeal swab specimens and should not be used as a sole basis for treatment. Nasal washings and aspirates are unacceptable for Xpert Xpress SARS-CoV-2/FLU/RSV testing.  Fact Sheet for Patients: bloggercourse.com  Fact Sheet for Healthcare Providers: seriousbroker.it  This test is not yet approved or cleared by the [place]  FDA and has been authorized for detection and/or diagnosis of SARS-CoV-2 by FDA under an Emergency Use Authorization (EUA). This EUA will remain in effect (meaning this test can be used) for the duration of the COVID-19 declaration under Section 564(b)(1) of the Act, 21 U.S.C. section 360bbb-3(b)(1), unless the authorization is terminated or revoked.     Resp Syncytial Virus by PCR NEGATIVE NEGATIVE Final    Comment: (NOTE) Fact Sheet for Patients: bloggercourse.com  Fact Sheet for Healthcare Providers: seriousbroker.it  This test is not yet approved or cleared by the [place]  FDA and has been authorized for detection and/or diagnosis of SARS-CoV-2 by FDA under an Emergency Use Authorization (EUA). This EUA will remain in effect (meaning this test can be used) for the duration of the COVID-19 declaration under Section 564(b)(1) of the Act, 21 U.S.C. section 360bbb-3(b)(1), unless the authorization is terminated or revoked.  Performed at [facility], [address], [place], [place] [address]          Radiology Studies: CT Head Wo Contrast Result Date: 03/28/2024 EXAM: CT HEAD WITHOUT CONTRAST 03/28/2024 07:12:17 PM TECHNIQUE: CT of the head was performed without the administration of intravenous contrast. Automated exposure control, iterative reconstruction, and/or weight based adjustment of the mA/kV was utilized to reduce the radiation dose to as low as reasonably achievable.  COMPARISON: 01/27/2024 CLINICAL HISTORY: Mental status change, unknown cause. Pt arrived via POV c/o on-going SOB since the end of last month. Pt reports having a pacemaker generator changed on 10/9 and was told the SOB would resolve following this procedure, however family report the Pt has not made any improvements. Pts family ; also report being advised to come to the ER for evaluation of a possible UTI. FINDINGS: BRAIN AND VENTRICLES: No acute hemorrhage. No evidence of acute infarct. Moderate parenchymal volume loss with proportional prominence of ventricles and sulci. Mild  periventricular white matter changes, likely sequela of chronic small vessel ischemic disease. No extra-axial collection. No mass effect or midline shift. Calcified atherosclerotic plaque in cavernous/supraclinoid ICA and intradural vertebral arteries. ORBITS: Bilateral lens replacements noted. SINUSES: No acute abnormality. SOFT TISSUES AND SKULL: No acute soft tissue abnormality. No skull fracture. IMPRESSION: 1. No acute intracranial abnormality. Electronically signed by: Morene Hoard MD 03/28/2024 07:39 PM EDT RP Workstation: [id number]   CT Angio Chest PE W/Cm &/Or Wo Cm Result Date: 03/28/2024 EXAM: CTA CHEST PE WITHOUT AND WITH CONTRAST CT ABDOMEN AND PELVIS WITHOUT AND WITH CONTRAST 03/28/2024 07:12:17 PM TECHNIQUE: CTA of the chest was performed after the administration of 80 mL of iohexol  (OMNIPAQUE ) 350 MG/ML injection. Multiplanar reformatted images are provided for review. MIP images are provided for review. CT of the abdomen and pelvis was performed without and with the administration of intravenous contrast. Automated exposure control, iterative reconstruction, and/or weight based adjustment of the mA/kV was utilized to reduce the radiation dose to as low as reasonably achievable. COMPARISON: None available. CLINICAL HISTORY: FINDINGS: CHEST: PULMONARY ARTERIES: Pulmonary arteries are adequately opacified for  evaluation. No intraluminal filling defect to suggest pulmonary embolism. Main pulmonary artery is normal in caliber. MEDIASTINUM: Heart is enlarged. There are atherosclerotic calcifications of the aorta. Left-sided pacemaker is present. Thyroid gland is significantly enlarged, left greater than right and diffusely heterogeneous. Left thyroid nodule measures 2.7 cm similar to the prior study. No mediastinal lymphadenopathy. The pericardium demonstrates no acute abnormality. LUNGS AND PLEURA: There is a small right pleural effusion. Volume loss and collapse of the right middle lobe appears unchanged from prior. Stable elevation of the right hemidiaphragm with some right basilar atelectasis. No pneumothorax. SOFT TISSUES AND BONES: Sternotomy wires are present. The bones are diffusely osteopenic. ABDOMEN AND PELVIS: LIVER: The liver is unremarkable. GALLBLADDER AND BILE DUCTS: Gallstones are present. No biliary ductal dilatation. SPLEEN: Spleen demonstrates no acute abnormality. PANCREAS: Pancreas demonstrates no acute abnormality. ADRENAL GLANDS: Adrenal glands demonstrate no acute abnormality. KIDNEYS, URETERS AND BLADDER: No stones in the kidneys or ureters. No hydronephrosis. No perinephric or periureteral stranding. Urinary bladder is unremarkable. GI AND BOWEL: There is colonic diverticulosis. Stomach and duodenal sweep demonstrate no acute abnormality. There is no bowel obstruction. No abnormal bowel wall thickening or distension. REPRODUCTIVE: Reproductive organs are unremarkable. PERITONEUM AND RETROPERITONEUM: There is trace free fluid in the left upper quadrant. No free air. LYMPH NODES: No lymphadenopathy. BONES AND SOFT TISSUES: The visualized bones are diffusely osteopenic. No focal soft tissue abnormality. IMPRESSION: 1. No evidence of pulmonary embolism. 2. Small right pleural effusion. 3. Chronic right middle lobe volume loss/collapse and elevated right hemidiaphragm with right basilar atelectasis,  unchanged from prior. 4. Enlarged thyroid gland with a 2.7 cm left thyroid nodule, similar to prior study. Follow-up thyroid ultrasound recommended if not performed. 5. Cardiomegaly. 6. Gallstones and colonic diverticulosis without evidence of diverticulitis. Electronically signed by: Greig Pique MD 03/28/2024 07:33 PM EDT RP Workstation: [id number]   CT ABDOMEN PELVIS W CONTRAST Result Date: 03/28/2024 EXAM: CTA CHEST PE WITHOUT AND WITH CONTRAST CT ABDOMEN AND PELVIS WITHOUT AND WITH CONTRAST 03/28/2024 07:12:17 PM TECHNIQUE: CTA of the chest was performed after the administration of 80 mL of iohexol  (OMNIPAQUE ) 350 MG/ML injection. Multiplanar reformatted images are provided for review. MIP images are provided for review. CT of the abdomen and pelvis was performed without and with the administration of intravenous contrast. Automated exposure control, iterative reconstruction, and/or weight based adjustment of the mA/kV was utilized  to reduce the radiation dose to as low as reasonably achievable. COMPARISON: None available. CLINICAL HISTORY: FINDINGS: CHEST: PULMONARY ARTERIES: Pulmonary arteries are adequately opacified for evaluation. No intraluminal filling defect to suggest pulmonary embolism. Main pulmonary artery is normal in caliber. MEDIASTINUM: Heart is enlarged. There are atherosclerotic calcifications of the aorta. Left-sided pacemaker is present. Thyroid gland is significantly enlarged, left greater than right and diffusely heterogeneous. Left thyroid nodule measures 2.7 cm similar to the prior study. No mediastinal lymphadenopathy. The pericardium demonstrates no acute abnormality. LUNGS AND PLEURA: There is a small right pleural effusion. Volume loss and collapse of the right middle lobe appears unchanged from prior. Stable elevation of the right hemidiaphragm with some right basilar atelectasis. No pneumothorax. SOFT TISSUES AND BONES: Sternotomy wires are present. The bones are diffusely  osteopenic. ABDOMEN AND PELVIS: LIVER: The liver is unremarkable. GALLBLADDER AND BILE DUCTS: Gallstones are present. No biliary ductal dilatation. SPLEEN: Spleen demonstrates no acute abnormality. PANCREAS: Pancreas demonstrates no acute abnormality. ADRENAL GLANDS: Adrenal glands demonstrate no acute abnormality. KIDNEYS, URETERS AND BLADDER: No stones in the kidneys or ureters. No hydronephrosis. No perinephric or periureteral stranding. Urinary bladder is unremarkable. GI AND BOWEL: There is colonic diverticulosis. Stomach and duodenal sweep demonstrate no acute abnormality. There is no bowel obstruction. No abnormal bowel wall thickening or distension. REPRODUCTIVE: Reproductive organs are unremarkable. PERITONEUM AND RETROPERITONEUM: There is trace free fluid in the left upper quadrant. No free air. LYMPH NODES: No lymphadenopathy. BONES AND SOFT TISSUES: The visualized bones are diffusely osteopenic. No focal soft tissue abnormality. IMPRESSION: 1. No evidence of pulmonary embolism. 2. Small right pleural effusion. 3. Chronic right middle lobe volume loss/collapse and elevated right hemidiaphragm with right basilar atelectasis, unchanged from prior. 4. Enlarged thyroid gland with a 2.7 cm left thyroid nodule, similar to prior study. Follow-up thyroid ultrasound recommended if not performed. 5. Cardiomegaly. 6. Gallstones and colonic diverticulosis without evidence of diverticulitis. Electronically signed by: Greig Pique MD 03/28/2024 07:33 PM EDT RP Workstation: [id number]   DG Chest 2 View Result Date: 03/28/2024 CLINICAL DATA:  Shortness of breath. EXAM: CHEST - 2 VIEW COMPARISON:  01/29/2024 FINDINGS: Prior median sternotomy. Left-sided pacemaker unchanged. Moderate stable elevation of the right hemidiaphragm. Linear density posterior right base likely atelectasis. Left lung is clear. Stable borderline cardiomegaly. Remainder of the exam is unchanged. IMPRESSION: 1. Linear density posterior right base  likely atelectasis. 2. Stable borderline cardiomegaly. Electronically Signed   By: Toribio Agreste M.D.   On: 03/28/2024 15:55        Scheduled Meds:  aspirin  EC  81 mg Oral Daily   Chlorhexidine Gluconate Cloth  6 each Topical Daily   furosemide   60 mg Intravenous Q12H   heparin   5,000 Units Subcutaneous Q8H   insulin  aspart  0-5 Units Subcutaneous QHS   insulin  aspart  0-6 Units Subcutaneous TID WC   lisinopril   10 mg Oral BID   metoprolol  tartrate  50 mg Oral BID   simvastatin   20 mg Oral Daily   sodium chloride  flush  3 mL Intravenous Q12H   tamsulosin   0.4 mg Oral BID   Continuous Infusions:        Shaney Deckman, MD [facility] 03/29/2024, 11:12 AM

## 2024-03-29 NOTE — Progress Notes (Signed)
 PT Cancellation Note  Patient Details Name: Guy Roberson MRN: [id number] DOB: 04-23-1932   Cancelled Treatment:    Reason Eval/Treat Not Completed: Patient declined, no reason specified, Pt refused to get OOB due to confusion and agitation - RN aware.    Kashlyn Salinas, SPT

## 2024-03-29 NOTE — TOC Initial Note (Signed)
 Transition of Care [facility]) - Initial/Assessment Note    Patient Details  Name: Guy Roberson MRN: [id number] Date of Birth: 12-Jul-1931  Transition of Care [facility]) CM/SW Contact:    Guy DELENA Bigness, LCSW Phone Number: 03/29/2024, 12:38 PM  Clinical Narrative:                 Pt at risk for readmission. Spoke to pt's DIL via t/c to complete assessment as pt is only oriented x 2. Pt is from home with his spouse whom has dementia. Pt's son and DIL live next door to pt and provide transportation for pt. Pt has RW and wheelchair at home and uses wheelchair majority of the time. Pt is active with [facility] for HHPT (ROC order will need to be placed prior to discharge if [facility] remains plan for disposition). ICM will continue to follow.  Expected Discharge Plan: Home w Home Health Services Barriers to Discharge: Continued Medical Work up   Patient Goals and CMS Choice Patient states their goals for this hospitalization and ongoing recovery are:: To return home CMS Medicare.gov Compare Post Acute Care list provided to:: Patient Represenative (must comment) Choice offered to / list presented to : Adult Children      Expected Discharge Plan and Services In-house Referral: Clinical Social Work Discharge Planning Services: NA Post Acute Care Choice: Home Health, Resumption of Svcs/PTA Provider Living arrangements for the past 2 months: Single Family Home                 DME Arranged: N/A DME Agency: NA                  Prior Living Arrangements/Services Living arrangements for the past 2 months: Single Family Home Lives with:: Spouse Patient language and need for interpreter reviewed:: Yes Do you feel safe going back to the place where you live?: Yes      Need for Family Participation in Patient Care: Yes (Comment) Care giver support system in place?: Yes (comment) Current home services: DME, Home PT (Wheelchair, RW) Criminal Activity/Legal Involvement Pertinent to Current  Situation/Hospitalization: No - Comment as needed  Activities of Daily Living   ADL Screening (condition at time of admission) Independently performs ADLs?: No Does the patient have a NEW difficulty with bathing/dressing/toileting/self-feeding that is expected to last >3 days?: No Does the patient have a NEW difficulty with getting in/out of bed, walking, or climbing stairs that is expected to last >3 days?: No Does the patient have a NEW difficulty with communication that is expected to last >3 days?: No Is the patient deaf or have difficulty hearing?: No Does the patient have difficulty seeing, even when wearing glasses/contacts?: No Does the patient have difficulty concentrating, remembering, or making decisions?: No  Permission Sought/Granted Permission sought to share information with : Facility Medical Sales Representative, Family Supports    Share Information with NAME: Guy Roberson, Guy (Other)  [phone]  Permission granted to share info w AGENCY: [facility]        Emotional Assessment Appearance:: Appears stated age Attitude/Demeanor/Rapport: Unable to Assess Affect (typically observed): Unable to Assess Orientation: : Oriented to Self, Oriented to Place Alcohol / Substance Use: Not Applicable Psych Involvement: No (comment)  Admission diagnosis:  Acute on chronic heart failure with preserved ejection fraction (HFpEF) (HCC) [I50.33] Patient Active Problem List   Diagnosis Date Noted   Acute on chronic heart failure with preserved ejection fraction (HFpEF) (HCC) 03/28/2024   Thrombocytopenia 03/28/2024   Benign hypertension with CKD (chronic kidney  disease) stage III (HCC) 02/10/2024   Chronic diastolic (congestive) heart failure (HCC) 02/10/2024   Thyroid nodule greater than or equal to 1.5 cm in diameter incidentally noted on imaging study 02/03/2024   Acute metabolic encephalopathy 01/28/2024   Urinary tract infection without hematuria 01/28/2024   CKD stage 3a, GFR 45-59 ml/min  (HCC) 01/27/2024   Aorto-iliac atherosclerosis 08/18/2022   Benign hypertension with coincident congestive heart failure (HCC) 08/18/2022   Hyperlipidemia associated with type 2 diabetes mellitus (HCC) 08/18/2022   OAB (overactive bladder) 08/18/2022   Mild protein-calorie malnutrition 08/18/2022   Chronic constipation 08/18/2022   Neurocognitive deficits 08/18/2022   Acute on chronic diastolic CHF (congestive heart failure) (HCC) 08/12/2022   Fall at home, initial encounter 08/12/2022   Arm bruise, left, initial encounter 08/12/2022   AKI (acute kidney injury) 08/12/2022   GERD (gastroesophageal reflux disease) 08/12/2022   Generalized weakness 03/04/2022   COVID-19 virus infection 03/04/2022   Acute congestive heart failure (HCC) 03/04/2022   Hypokalemia 03/04/2022   Gross hematuria 07/07/2021   Benign prostatic hyperplasia with urinary retention 07/07/2021   Bladder diverticulum 07/07/2021   Neurogenic bladder 07/07/2021   Urinary retention 01/01/2012    Class: Present on Admission   Pacemaker-[employer]tr[employer] 12/30/2011   Acute respiratory failure with hypoxia (HCC) 12/29/2011   Rib fracture 12/29/2011   Hypoxemia 12/29/2011   Syncope    Complete heart block (HCC)    Mixed hyperlipidemia    CAD (coronary artery disease)    Arteriosclerotic cardiovascular disease (ASCVD)    Fasting hyperglycemia    Colonic polyp    Type 2 diabetes mellitus with other circulatory complications (HCC) 10/02/2009   HYPERLIPIDEMIA 09/30/2009   Essential hypertension 09/30/2009   PCP:  [name], MD Pharmacy:   [facility] [place], [place] - [address] [place] [address] Phone: [phone] Fax: [phone]     Social Drivers of Health (SDOH) Social History: SDOH Screenings   Food Insecurity: Patient Unable To Answer (03/29/2024)  Housing: Unknown (03/29/2024)  Transportation Needs: Patient Unable To Answer (03/29/2024)  Utilities: Not At Risk  (03/29/2024)  Depression (PHQ2-9): Low Risk  (08/17/2022)  Social Connections: Unknown (03/29/2024)  Tobacco Use: High Risk (03/28/2024)   SDOH Interventions:     Readmission Risk Interventions    03/29/2024   12:32 PM 08/12/2022    1:48 PM 03/06/2022    2:29 PM  Readmission Risk Prevention Plan  Transportation Screening Complete Complete Complete  PCP or Specialist Appt within 3-5 Days Complete    Home Care Screening   Complete  Medication Review (RN CM)   Complete  HRI or Home Care Consult Complete Complete   Social Work Consult for Recovery Care Planning/Counseling Complete Complete   Palliative Care Screening Not Applicable Not Applicable   Medication Review Oceanographer) Complete Complete

## 2024-03-29 NOTE — Progress Notes (Signed)
 25G D50 administered per hypoglycemic protocol.

## 2024-03-29 NOTE — Progress Notes (Signed)
  Echocardiogram 2D Echocardiogram has been performed.  Koleen KANDICE Popper, RDCS 03/29/2024, 3:45 PM

## 2024-03-29 NOTE — Progress Notes (Signed)
 Patient arrived to room ICU 11 from ED.  Assessment complete, VS obtained, and Admission database began.

## 2024-03-30 ENCOUNTER — Encounter

## 2024-03-30 DIAGNOSIS — Z515 Encounter for palliative care: Secondary | ICD-10-CM | POA: Diagnosis not present

## 2024-03-30 DIAGNOSIS — Z66 Do not resuscitate: Secondary | ICD-10-CM | POA: Diagnosis not present

## 2024-03-30 DIAGNOSIS — Z7189 Other specified counseling: Secondary | ICD-10-CM

## 2024-03-30 DIAGNOSIS — I5033 Acute on chronic diastolic (congestive) heart failure: Secondary | ICD-10-CM | POA: Diagnosis not present

## 2024-03-30 DIAGNOSIS — I10 Essential (primary) hypertension: Secondary | ICD-10-CM

## 2024-03-30 DIAGNOSIS — Z789 Other specified health status: Secondary | ICD-10-CM

## 2024-03-30 LAB — URINALYSIS, ROUTINE W REFLEX MICROSCOPIC
Bacteria, UA: NONE SEEN
Bilirubin Urine: NEGATIVE
Glucose, UA: NEGATIVE mg/dL
Hgb urine dipstick: NEGATIVE
Ketones, ur: NEGATIVE mg/dL
Nitrite: NEGATIVE
Protein, ur: NEGATIVE mg/dL
Specific Gravity, Urine: 1.011 (ref 1.005–1.030)
pH: 7 (ref 5.0–8.0)

## 2024-03-30 LAB — CBC
HCT: 33.7 % — ABNORMAL LOW (ref 39.0–52.0)
Hemoglobin: 10.8 g/dL — ABNORMAL LOW (ref 13.0–17.0)
MCH: 29.8 pg (ref 26.0–34.0)
MCHC: 32 g/dL (ref 30.0–36.0)
MCV: 93.1 fL (ref 80.0–100.0)
Platelets: 101 K/uL — ABNORMAL LOW (ref 150–400)
RBC: 3.62 MIL/uL — ABNORMAL LOW (ref 4.22–5.81)
RDW: 15.5 % (ref 11.5–15.5)
WBC: 6.3 K/uL (ref 4.0–10.5)
nRBC: 0 % (ref 0.0–0.2)

## 2024-03-30 LAB — GLUCOSE, CAPILLARY
Glucose-Capillary: 72 mg/dL (ref 70–99)
Glucose-Capillary: 102 mg/dL — ABNORMAL HIGH (ref 70–99)
Glucose-Capillary: 110 mg/dL — ABNORMAL HIGH (ref 70–99)
Glucose-Capillary: 117 mg/dL — ABNORMAL HIGH (ref 70–99)

## 2024-03-30 LAB — BASIC METABOLIC PANEL WITH GFR
Anion gap: 15 (ref 5–15)
BUN: 37 mg/dL — ABNORMAL HIGH (ref 8–23)
CO2: 26 mmol/L (ref 22–32)
Calcium: 10.9 mg/dL — ABNORMAL HIGH (ref 8.9–10.3)
Chloride: 99 mmol/L (ref 98–111)
Creatinine, Ser: 1.59 mg/dL — ABNORMAL HIGH (ref 0.61–1.24)
GFR, Estimated: 41 mL/min — ABNORMAL LOW (ref >60)
Glucose, Bld: 72 mg/dL (ref 70–99)
Potassium: 3.7 mmol/L (ref 3.5–5.1)
Sodium: 140 mmol/L (ref 135–145)

## 2024-03-30 MED ORDER — ZOLEDRONIC ACID 4 MG/100ML IV SOLN
4.0000 mg | Freq: Once | INTRAVENOUS | Status: AC
  Administered 2024-03-30: 4 mg via INTRAVENOUS
  Filled 2024-03-30: qty 100

## 2024-03-30 MED ORDER — DEXTROSE 5 % IV SOLN: INTRAVENOUS | Status: AC

## 2024-03-30 NOTE — TOC Progression Note (Signed)
 Transition of Care [facility]) - Progression Note    Patient Details  Name: Guy Roberson MRN: [id number] Date of Birth: 15-Apr-1932  Transition of Care [facility]) CM/SW Contact  Sharlyne Stabs, RN Phone Number: 03/30/2024, 1:55 PM  Clinical Narrative:   PT is recommending SNF. Patient is confused, palliative at the bedside. Family called, they are agreeable to sent out for SNF, but will make that final decision pending how he does in the coming days. Last admission patient went to [facility]. FL2 completed and sent out, DC planning for 2-3 days. IPCM following.   Expected Discharge Plan: Skilled Nursing Facility Barriers to Discharge: Continued Medical Work up, SNF Pending bed offer     Expected Discharge Plan and Services In-house Referral: Clinical Social Work Discharge Planning Services: NA Post Acute Care Choice: Home Health, Resumption of Svcs/PTA Provider Living arrangements for the past 2 months: Single Family Home                 DME Arranged: N/A DME Agency: NA        Social Drivers of Health (SDOH) Interventions SDOH Screenings   Food Insecurity: Patient Unable To Answer (03/29/2024)  Housing: Unknown (03/29/2024)  Transportation Needs: Patient Unable To Answer (03/29/2024)  Utilities: Not At Risk (03/29/2024)  Depression (PHQ2-9): Low Risk  (08/17/2022)  Social Connections: Unknown (03/29/2024)  Tobacco Use: High Risk (03/28/2024)    Readmission Risk Interventions    03/29/2024   12:32 PM 08/12/2022    1:48 PM 03/06/2022    2:29 PM  Readmission Risk Prevention Plan  Transportation Screening Complete Complete Complete  PCP or Specialist Appt within 3-5 Days Complete    Home Care Screening   Complete  Medication Review (RN CM)   Complete  HRI or Home Care Consult Complete Complete   Social Work Consult for Recovery Care Planning/Counseling Complete Complete   Palliative Care Screening Not Applicable Not Applicable   Medication Review Oceanographer) Complete Complete

## 2024-03-30 NOTE — Evaluation (Signed)
 Physical Therapy Evaluation Patient Details Name: Guy Roberson MRN: [id number] DOB: 21-Jul-1931 Today's Date: 03/30/2024  History of Present Illness  Guy Roberson is a 89 y.o. male with medical history significant for hypertension, type 2 diabetes mellitus, CAD status post CABG, complete heart block with pacemaker, CKD 3A, chronic HFpEF, and BPH who presents with shortness of breath and increased confusion.     Patient is brought in by his son who is concerned that the patient has had worsening shortness of breath over the course of a month or so.  There has not been much cough and no fever has been documented.  Patient has had increased lower extremity swelling and was unable to get his shoes on today due to this.  Shortness of breath was worse last night and did not improve with albuterol  MDI.  Family also notes that the patient seems to be hallucinating and more confused than usual.    Clinical Impression  Pt. Presented confused with generalized weakness, family was there to confirm pt. PLOF. Pt was agitated intially during the treatment, and had difficulty understanding and completing commands. With cuing pt was gradually understood what the task was, but needed it be repeated couple of times. Pt. Required Min A for bed mobility, and Mod A/ Max A for transfer and ambulation. Nursing staff was notified on pt. Status. Patient will benefit from continued skilled physical therapy in hospital and recommended venue below to increase strength, balance, endurance for safe ADLs and gait.       If plan is discharge home, recommend the following: A lot of help with bathing/dressing/bathroom;Assistance with cooking/housework;Assist for transportation;Assistance with feeding;Help with stairs or ramp for entrance;A lot of help with walking and/or transfers   Can travel by private vehicle   Yes    Equipment Recommendations None recommended by PT  Recommendations for Other Services       Functional  Status Assessment Patient has had a recent decline in their functional status and demonstrates the ability to make significant improvements in function in a reasonable and predictable amount of time.     Precautions / Restrictions Precautions Precautions: Fall Recall of Precautions/Restrictions: Intact Restrictions Weight Bearing Restrictions Per Provider Order: No      Mobility  Bed Mobility Overal bed mobility: Needs Assistance Bed Mobility: Supine to Sit, Rolling Rolling: Mod assist, Min assist   Supine to sit: Min assist, Mod assist, HOB elevated     General bed mobility comments: Pt. slowed and labored to EOB, needed cuing to bring legs/foot to EOB    Transfers Overall transfer level: Needs assistance Equipment used: Rolling walker (2 wheels) Transfers: Sit to/from Stand, Bed to chair/wheelchair/BSC Sit to Stand: Max assist, Mod assist   Step pivot transfers: Mod assist, Max assist       General transfer comment: Pt. used RW for transfer, and had diffculty understading directions during transfer    Ambulation/Gait Ambulation/Gait assistance: Mod assist, Max assist Gait Distance (Feet): 6 Feet Assistive device: Rolling walker (2 wheels) Gait Pattern/deviations: Decreased step length - right, Decreased step length - left, Decreased stance time - right, Decreased stance time - left, Decreased stride length, Step-to pattern, Narrow base of support       General Gait Details: Pt. did side step from bed to chair with RW, it required Mod A/ Max A, and pt had diffculty understanding what the task was, without cues.  Stairs            Psychologist, [employer]  Tilt Bed    Modified Rankin (Stroke Patients Only)       Balance Overall balance assessment: Needs assistance Sitting-balance support: Feet supported, Bilateral upper extremity supported Sitting balance-Leahy Scale: Poor Sitting balance - Comments: Poor at EOB, and postural control Postural control:  Left lateral lean Standing balance support: During functional activity, Reliant on assistive device for balance Standing balance-Leahy Scale: Poor Standing balance comment: Poor, Pt needed RW                             Pertinent Vitals/Pain Pain Assessment Pain Assessment: No/denies pain    Home Living Family/patient expects to be discharged to:: Private residence Living Arrangements: Spouse/significant other Available Help at Discharge: Family;Available PRN/intermittently Type of Home: House Home Access: Ramped entrance       Home Layout: One level Home Equipment: Rollator (4 wheels);Rolling Walker (2 wheels);Cane - single point;Wheelchair - manual Additional Comments: Pt. was confused during treatment, pt. son and daughter in-law were in the room to confirm the questions    Prior Function Prior Level of Function : Needs assist       Physical Assist : Mobility (physical);ADLs (physical) Mobility (physical): Bed mobility;Transfers;Gait;Stairs ADLs (physical): Feeding;Grooming;Bathing;Dressing;Toileting;IADLs Mobility Comments: Pt. able to get to EOB with RW ADLs Comments: Unable to independently do self-care and ADLs due to confusion, family and caregiver assist with needs     Extremity/Trunk Assessment        Lower Extremity Assessment Lower Extremity Assessment: Generalized weakness    Cervical / Trunk Assessment Cervical / Trunk Assessment: Kyphotic  Communication   Communication Communication: Impaired Factors Affecting Communication: Hearing impaired;Other (comment) (Has hearing aides)    Cognition Arousal: Lethargic Behavior During Therapy: Agitated   PT - Cognitive impairments: Awareness, Memory                       PT - Cognition Comments: Diffculty with understand and doing commands without cues, and repetition. Following commands: Impaired Following commands impaired: Follows one step commands with increased time      Cueing Cueing Techniques: Verbal cues, Gestural cues, Tactile cues, Visual cues     General Comments      Exercises     Assessment/Plan    PT Assessment Patient needs continued PT services  PT Problem List Decreased strength;Decreased range of motion;Decreased activity tolerance;Decreased balance;Decreased mobility;Decreased safety awareness;Decreased cognition       PT Treatment Interventions DME instruction;Gait training;Stair training;Functional mobility training;Therapeutic activities;Therapeutic exercise;Balance training;Patient/family education    PT Goals (Current goals can be found in the Care Plan section)  Acute Rehab PT Goals Patient Stated Goal: Pt. wants to go SNF to improve mobility and endurance PT Goal Formulation: With family Time For Goal Achievement: 04/07/24 Potential to Achieve Goals: Good    Frequency Min 3X/week     Co-evaluation               AM-PAC PT 6 Clicks Mobility  Outcome Measure Help needed turning from your back to your side while in a flat bed without using bedrails?: A Little Help needed moving from lying on your back to sitting on the side of a flat bed without using bedrails?: A Lot Help needed moving to and from a bed to a chair (including a wheelchair)?: A Lot Help needed standing up from a chair using your arms (e.g., wheelchair or bedside chair)?: A Lot Help needed to walk in hospital room?: A Lot  Help needed climbing 3-5 steps with a railing? : Total 6 Click Score: 12    End of Session   Activity Tolerance: Patient limited by lethargy;Patient limited by fatigue;Patient tolerated treatment well Patient left: in chair;with family/visitor present;with call bell/phone within reach Nurse Communication: Mobility status PT Visit Diagnosis: Repeated falls (R29.6);Muscle weakness (generalized) (M62.81);Other abnormalities of gait and mobility (R26.89)    Time: 9094-9057 PT Time Calculation (min) (ACUTE ONLY): 37  min   Charges:   PT Evaluation $PT Eval Moderate Complexity: 1 Mod PT Treatments $Therapeutic Activity: 23-37 mins PT General Charges $$ ACUTE PT VISIT: 1 Visit        Kade Rickels, SPT

## 2024-03-30 NOTE — Plan of Care (Signed)
  Problem: Education: Goal: Ability to describe self-care measures that may prevent or decrease complications (Diabetes Survival Skills Education) will improve Outcome: Progressing Goal: Individualized Educational Video(s) Outcome: Progressing   Problem: Coping: Goal: Ability to adjust to condition or change in health will improve Outcome: Progressing   Problem: Fluid Volume: Goal: Ability to maintain a balanced intake and output will improve Outcome: Progressing   Problem: Health Behavior/Discharge Planning: Goal: Ability to identify and utilize available resources and services will improve Outcome: Progressing Goal: Ability to manage health-related needs will improve Outcome: Progressing   Problem: Metabolic: Goal: Ability to maintain appropriate glucose levels will improve Outcome: Progressing   Problem: Nutritional: Goal: Maintenance of adequate nutrition will improve Outcome: Progressing Goal: Progress toward achieving an optimal weight will improve Outcome: Progressing   Problem: Skin Integrity: Goal: Risk for impaired skin integrity will decrease Outcome: Progressing   Problem: Tissue Perfusion: Goal: Adequacy of tissue perfusion will improve Outcome: Progressing   Problem: Education: Goal: Knowledge of General Education information will improve Description: Including pain rating scale, medication(s)/side effects and non-pharmacologic comfort measures Outcome: Progressing   Problem: Health Behavior/Discharge Planning: Goal: Ability to manage health-related needs will improve Outcome: Progressing   Problem: Clinical Measurements: Goal: Ability to maintain clinical measurements within normal limits will improve Outcome: Progressing Goal: Will remain free from infection Outcome: Progressing Goal: Diagnostic test results will improve Outcome: Progressing Goal: Respiratory complications will improve Outcome: Progressing Goal: Cardiovascular complication will  be avoided Outcome: Progressing   Problem: Activity: Goal: Risk for activity intolerance will decrease Outcome: Progressing   Problem: Nutrition: Goal: Adequate nutrition will be maintained Outcome: Progressing   Problem: Coping: Goal: Level of anxiety will decrease Outcome: Progressing   Problem: Elimination: Goal: Will not experience complications related to bowel motility Outcome: Progressing Goal: Will not experience complications related to urinary retention Outcome: Progressing   Problem: Pain Managment: Goal: General experience of comfort will improve and/or be controlled Outcome: Progressing   Problem: Safety: Goal: Ability to remain free from injury will improve Outcome: Progressing   Problem: Skin Integrity: Goal: Risk for impaired skin integrity will decrease Outcome: Progressing   Problem: Education: Goal: Ability to demonstrate management of disease process will improve Outcome: Progressing Goal: Ability to verbalize understanding of medication therapies will improve Outcome: Progressing Goal: Individualized Educational Video(s) Outcome: Progressing   Problem: Activity: Goal: Capacity to carry out activities will improve Outcome: Progressing   Problem: Cardiac: Goal: Ability to achieve and maintain adequate cardiopulmonary perfusion will improve Outcome: Progressing   Problem: Education: Goal: Ability to demonstrate management of disease process will improve Outcome: Progressing Goal: Ability to verbalize understanding of medication therapies will improve Outcome: Progressing Goal: Individualized Educational Video(s) Outcome: Progressing   Problem: Activity: Goal: Capacity to carry out activities will improve Outcome: Progressing   Problem: Cardiac: Goal: Ability to achieve and maintain adequate cardiopulmonary perfusion will improve Outcome: Progressing

## 2024-03-30 NOTE — [place] FL2 (Signed)
 [place]  MEDICAID FL2 LEVEL OF CARE FORM     IDENTIFICATION  Patient Name: Guy Roberson Birthdate: March 21, 1932 Sex: male Admission Date (Current Location): 03/28/2024  [facility] and [place] Number:  [employer] and Address:  [facility],  [address] [place] [address]      Provider Number: [id number]  Attending Physician Name and Address:  Mcarthur Pick, MD  Relative Name and Phone Number:  Arney, Mayabb   [phone]    Current Level of Care: Hospital Recommended Level of Care: Skilled Nursing Facility Prior Approval Number:    Date Approved/Denied:   PASRR Number: [id number]  Discharge Plan: SNF    Current Diagnoses: Patient Active Problem List   Diagnosis Date Noted   Acute on chronic heart failure with preserved ejection fraction (HFpEF) (HCC) 03/28/2024   Thrombocytopenia 03/28/2024   Benign hypertension with CKD (chronic kidney disease) stage III (HCC) 02/10/2024   Chronic diastolic (congestive) heart failure (HCC) 02/10/2024   Thyroid nodule greater than or equal to 1.5 cm in diameter incidentally noted on imaging study 02/03/2024   Acute metabolic encephalopathy 01/28/2024   Urinary tract infection without hematuria 01/28/2024   CKD stage 3a, GFR 45-59 ml/min (HCC) 01/27/2024   Aorto-iliac atherosclerosis 08/18/2022   Benign hypertension with coincident congestive heart failure (HCC) 08/18/2022   Hyperlipidemia associated with type 2 diabetes mellitus (HCC) 08/18/2022   OAB (overactive bladder) 08/18/2022   Mild protein-calorie malnutrition 08/18/2022   Chronic constipation 08/18/2022   Neurocognitive deficits 08/18/2022   Acute on chronic diastolic CHF (congestive heart failure) (HCC) 08/12/2022   Fall at home, initial encounter 08/12/2022   Arm bruise, left, initial encounter 08/12/2022   AKI (acute kidney injury) 08/12/2022   GERD (gastroesophageal reflux disease) 08/12/2022   Generalized weakness 03/04/2022   COVID-19  virus infection 03/04/2022   Acute congestive heart failure (HCC) 03/04/2022   Hypokalemia 03/04/2022   Gross hematuria 07/07/2021   Benign prostatic hyperplasia with urinary retention 07/07/2021   Bladder diverticulum 07/07/2021   Neurogenic bladder 07/07/2021   Urinary retention 01/01/2012   Pacemaker-[employer]tr[employer] 12/30/2011   Acute respiratory failure with hypoxia (HCC) 12/29/2011   Rib fracture 12/29/2011   Hypoxemia 12/29/2011   Syncope    Complete heart block (HCC)    Mixed hyperlipidemia    CAD (coronary artery disease)    Arteriosclerotic cardiovascular disease (ASCVD)    Fasting hyperglycemia    Colonic polyp    Type 2 diabetes mellitus with other circulatory complications (HCC) 10/02/2009   HYPERLIPIDEMIA 09/30/2009   Essential hypertension 09/30/2009    Orientation RESPIRATION BLADDER Height & Weight     Self  Normal Incontinent Weight: 76.8 kg Height:  5' 7 (170.2 cm)  BEHAVIORAL SYMPTOMS/MOOD NEUROLOGICAL BOWEL NUTRITION STATUS      Continent Diet (See DC Summary)  AMBULATORY STATUS COMMUNICATION OF NEEDS Skin   Extensive Assist Verbally Bruising                       Personal Care Assistance Level of Assistance  Bathing, Feeding, Dressing Bathing Assistance: Maximum assistance Feeding assistance: Limited assistance Dressing Assistance: Maximum assistance     Functional Limitations Info  Sight, Speech, Hearing Sight Info: Adequate Hearing Info: Impaired Speech Info: Adequate    SPECIAL CARE FACTORS FREQUENCY  PT (By licensed PT)     PT Frequency: 5 times a week              Contractures Contractures Info: Not present    Additional  Factors Info  Code Status, Allergies Code Status Info: DNR - Limited Allergies Info: Penicillins           Current Medications (03/30/2024):  This is the current hospital active medication list Current Facility-Administered Medications  Medication Dose Route Frequency Provider Last Rate Last Admin    acetaminophen  (TYLENOL ) tablet 650 mg  650 mg Oral Q6H PRN Opyd, Timothy S, MD       Or   acetaminophen  (TYLENOL ) suppository 650 mg  650 mg Rectal Q6H PRN Opyd, Timothy S, MD       aspirin  EC tablet 81 mg  81 mg Oral Daily Opyd, Timothy S, MD   81 mg at 03/30/24 9158   Chlorhexidine Gluconate Cloth 2 % PADS 6 each  6 each Topical Daily Opyd, Evalene RAMAN, MD   6 each at 03/30/24 9157   dextrose  5 % solution   Intravenous Continuous Sigdel, Santosh, MD       furosemide  (LASIX ) injection 60 mg  60 mg Intravenous Q12H Opyd, Timothy S, MD   60 mg at 03/30/24 0411   guaiFENesin -dextromethorphan  (ROBITUSSIN DM) 100-10 MG/5ML syrup 5 mL  5 mL Oral Q4H PRN Opyd, Timothy S, MD   5 mL at 03/29/24 0212   heparin  injection 5,000 Units  5,000 Units Subcutaneous Q8H Opyd, Timothy S, MD   5,000 Units at 03/29/24 1524   insulin  aspart (novoLOG ) injection 0-5 Units  0-5 Units Subcutaneous QHS Opyd, Timothy S, MD       insulin  aspart (novoLOG ) injection 0-6 Units  0-6 Units Subcutaneous TID WC Opyd, Timothy S, MD       lisinopril  (ZESTRIL ) tablet 10 mg  10 mg Oral BID Opyd, Timothy S, MD   10 mg at 03/30/24 9158   metoprolol  tartrate (LOPRESSOR ) tablet 50 mg  50 mg Oral BID Opyd, Timothy S, MD   50 mg at 03/30/24 9158   prochlorperazine (COMPAZINE) injection 5 mg  5 mg Intravenous Q6H PRN Opyd, Evalene RAMAN, MD       senna-docusate (Senokot-S) tablet 1 tablet  1 tablet Oral QHS PRN Opyd, Timothy S, MD       simvastatin  (ZOCOR ) tablet 20 mg  20 mg Oral Daily Opyd, Timothy S, MD   20 mg at 03/30/24 0841   sodium chloride  flush (NS) 0.9 % injection 3 mL  3 mL Intravenous Q12H Opyd, Timothy S, MD   3 mL at 03/30/24 9157   tamsulosin  (FLOMAX ) capsule 0.4 mg  0.4 mg Oral BID Opyd, Timothy S, MD   0.4 mg at 03/30/24 0841   Zoledronic Acid (ZOMETA) IVPB 4 mg  4 mg Intravenous Once Sigdel, Santosh, MD         Discharge Medications: Please see discharge summary for a list of discharge medications.  Relevant Imaging  Results:  Relevant Lab Results:   Additional Information SS# [id number]  Sharlyne Stabs, RN

## 2024-03-30 NOTE — Progress Notes (Signed)
 PROGRESS NOTE    Guy Roberson  FMW:[id number] DOB: 11/08/1931 DOA: 03/28/2024 PCP: [name], MD   Brief Narrative:   Guy Roberson is a 89 y.o. male with medical history significant for hypertension, type 2 diabetes mellitus, CAD status post CABG, complete heart block with pacemaker, CKD 3A, chronic HFpEF, and BPH who presents with shortness of breath and increased confusion.   Patient is brought in by his son who is concerned that the patient has had worsening shortness of breath over the course of a month or so.  There has not been much cough and no fever has been documented.  Patient has had increased lower extremity swelling and was unable to get his shoes on today due to this.  Shortness of breath was worse last night and did not improve with albuterol  MDI.  Family also notes that the patient seems to be hallucinating and more confused than usual.   ED Course: Upon arrival to the ED, patient is found to be afebrile and saturating mid 90s on room air with stable BP.  Labs are most notable for creatinine 1.32, normal WBC, platelets 107,000, and proBNP 3949.  CTA chest is negative for PE but notable for small right pleural effusion and chronic RML volume loss.  There are no acute findings on head CT or CT of the abdomen and pelvis.   Patient admitted for CHF exacerbation.   Assessment & Plan:   Principal Problem:   Acute on chronic heart failure with preserved ejection fraction (HFpEF) (HCC) Active Problems:   Type 2 diabetes mellitus with other circulatory complications (HCC)   Essential hypertension   CAD (coronary artery disease)   CKD stage 3a, GFR 45-59 ml/min (HCC)   Acute metabolic encephalopathy   Thrombocytopenia  1. Acute on chronic HFpEF  - EF was 55-60% on echo from September 2024.   -Echo/LVEF 50%.  Severely elevated RVSP.  CT shows no PE - Will continue to diurese with IV Lasix  60 twice daily, monitor weight, intake and output, daily electrolytes, BMP - EKG  with paced rhythm   2. Acute encephalopathy - Family is concerned that patient has been more disoriented lately  - No acute findings on head CT, no infectious s/s, CT chest abdomen and pelvis unremarkable to suggest infection.  UA is negative too -TSH, ammonia within normal limits, RPR, B12 pending. -Follow delirium precautions. - Will recheck UA as family is concerned about UTI because of the presentation.  UA positive for leukocytes esterase but white count 0-5.  Unlikely UTI.  3.  Hypercalcemia: Calcium  10.7.  Will give Zometa.  Avoiding IV fluid due to CHF.   3. Thrombocytopenia  - Platelets 107,000 on admission, down from 112k on 03/09/24  - LFTs are normal; WBC is normal and there is a mild normocytic anemia   - Request smear review, repeat CBC in am     4. CAD  - Hx of CABG in 2011  - No anginal symptoms  - Continue Zocor , ASA, and metoprolol      5. Type II DM  - A1c was 6.7% in August 2025  - Check CBGs and use low-intensity SSI for now     6. CKD 3A  - Appears close to baseline  - Renally-dose medications       DVT prophylaxis: sq heparin   Code Status: DNR/DNI Level of Care: Level of care: Telemetry Family Communication: Son, daughter at bedside  Disposition Plan:  Patient is from: home  Anticipated d/c  is to: TBD Patient currently: Pending improved volume status, PT eval, disposition planning  Consults called: None  Admission status: Inpatient     Subjective: Patient seen and examined at the bedside.  He is somnolent.  Confused.  Vital signs are stable.  He is afebrile. Objective: Vitals:   03/30/24 1121 03/30/24 1200 03/30/24 1300 03/30/24 1623  BP:  (!) 107/47  134/75  Pulse:  (!) 59  74  Resp:  16  18  Temp: (!) 97.1 F (36.2 C)  98.2 F (36.8 C) 98.3 F (36.8 C)  TempSrc: Rectal  Rectal Rectal  SpO2:  95%  95%  Weight:      Height:        Intake/Output Summary (Last 24 hours) at 03/30/2024 1628 Last data filed at 03/30/2024 1538 Gross per 24  hour  Intake 1021.68 ml  Output 1600 ml  Net -578.32 ml   Filed Weights   03/28/24 1522 03/29/24 0500 03/30/24 0553  Weight: 78.9 kg 80.2 kg 76.8 kg    Examination:  General: Lethargic, arousable Chest: Diminished bilaterally, bilateral crackles CVs: S1, S2, no murmur, regular rhythm Abdomen: Soft, nontender, no organomegaly Extremities: Bilateral peripheral edema   Data Reviewed: I have personally reviewed following labs and imaging studies  CBC: Recent Labs  Lab 03/28/24 1543 03/29/24 0445 03/30/24 0440  WBC 5.5 5.5 6.3  NEUTROABS 3.9  --   --   HGB 11.3* 10.8* 10.8*  HCT 34.8* 33.5* 33.7*  MCV 92.3 93.3 93.1  PLT 107* 101* 101*   Basic Metabolic Panel: Recent Labs  Lab 03/28/24 1543 03/29/24 0445 03/30/24 0440  NA 135 139 140  K 4.9 4.1 3.7  CL 100 100 99  CO2 25 27 26   GLUCOSE 87 72 72  BUN 38* 39* 37*  CREATININE 1.32* 1.46* 1.59*  CALCIUM  10.6* 10.7* 10.9*  MG 2.3 2.3  --    GFR: Estimated Creatinine Clearance: 28.9 mL/min (A) (by C-G formula based on SCr of 1.59 mg/dL (H)). Liver Function Tests: Recent Labs  Lab 03/28/24 1543  AST 27  ALT 20  ALKPHOS 108  BILITOT 0.4  PROT 7.0  ALBUMIN 4.5   No results for input(s): LIPASE, AMYLASE in the last 168 hours. Recent Labs  Lab 03/28/24 2045  AMMONIA 32   Coagulation Profile: Recent Labs  Lab 03/29/24 0445  INR 1.1   Cardiac Enzymes: No results for input(s): CKTOTAL, CKMB, CKMBINDEX, TROPONINI in the last 168 hours. BNP (last 3 results) Recent Labs    03/28/24 1543  PROBNP 3,949.0*   HbA1C: No results for input(s): HGBA1C in the last 72 hours. CBG: Recent Labs  Lab 03/29/24 1359 03/29/24 1623 03/29/24 1931 03/30/24 0727 03/30/24 1124  GLUCAP 73 84 95 72 102*   Lipid Profile: No results for input(s): CHOL, HDL, LDLCALC, TRIG, CHOLHDL, LDLDIRECT in the last 72 hours. Thyroid Function Tests: Recent Labs    03/28/24 1543  TSH 1.840   Anemia  Panel: Recent Labs    03/28/24 1543  VITAMINB12 982*   Sepsis Labs: No results for input(s): PROCALCITON, LATICACIDVEN in the last 168 hours.  Recent Results (from the past 240 hours)  Resp panel by RT-PCR (RSV, Flu A&B, Covid) Anterior Nasal Swab     Status: None   Collection Time: 03/28/24  3:32 PM   Specimen: Anterior Nasal Swab  Result Value Ref Range Status   SARS Coronavirus 2 by RT PCR NEGATIVE NEGATIVE Final    Comment: (NOTE) SARS-CoV-2 target nucleic acids are  NOT DETECTED.  The SARS-CoV-2 RNA is generally detectable in upper respiratory specimens during the acute phase of infection. The lowest concentration of SARS-CoV-2 viral copies this assay can detect is 138 copies/mL. A negative result does not preclude SARS-Cov-2 infection and should not be used as the sole basis for treatment or other patient management decisions. A negative result may occur with  improper specimen collection/handling, submission of specimen other than nasopharyngeal swab, presence of viral mutation(s) within the areas targeted by this assay, and inadequate number of viral copies(<138 copies/mL). A negative result must be combined with clinical observations, patient history, and epidemiological information. The expected result is Negative.  Fact Sheet for Patients:  bloggercourse.com  Fact Sheet for Healthcare Providers:  seriousbroker.it  This test is no t yet approved or cleared by the [place]  FDA and  has been authorized for detection and/or diagnosis of SARS-CoV-2 by FDA under an Emergency Use Authorization (EUA). This EUA will remain  in effect (meaning this test can be used) for the duration of the COVID-19 declaration under Section 564(b)(1) of the Act, 21 U.S.C.section 360bbb-3(b)(1), unless the authorization is terminated  or revoked sooner.       Influenza A by PCR NEGATIVE NEGATIVE Final   Influenza B by PCR  NEGATIVE NEGATIVE Final    Comment: (NOTE) The Xpert Xpress SARS-CoV-2/FLU/RSV plus assay is intended as an aid in the diagnosis of influenza from Nasopharyngeal swab specimens and should not be used as a sole basis for treatment. Nasal washings and aspirates are unacceptable for Xpert Xpress SARS-CoV-2/FLU/RSV testing.  Fact Sheet for Patients: bloggercourse.com  Fact Sheet for Healthcare Providers: seriousbroker.it  This test is not yet approved or cleared by the [place]  FDA and has been authorized for detection and/or diagnosis of SARS-CoV-2 by FDA under an Emergency Use Authorization (EUA). This EUA will remain in effect (meaning this test can be used) for the duration of the COVID-19 declaration under Section 564(b)(1) of the Act, 21 U.S.C. section 360bbb-3(b)(1), unless the authorization is terminated or revoked.     Resp Syncytial Virus by PCR NEGATIVE NEGATIVE Final    Comment: (NOTE) Fact Sheet for Patients: bloggercourse.com  Fact Sheet for Healthcare Providers: seriousbroker.it  This test is not yet approved or cleared by the [place]  FDA and has been authorized for detection and/or diagnosis of SARS-CoV-2 by FDA under an Emergency Use Authorization (EUA). This EUA will remain in effect (meaning this test can be used) for the duration of the COVID-19 declaration under Section 564(b)(1) of the Act, 21 U.S.C. section 360bbb-3(b)(1), unless the authorization is terminated or revoked.  Performed at [facility], [address] [place], [place] [address]   MRSA Next Gen by PCR, Nasal     Status: None   Collection Time: 03/29/24 12:00 AM   Specimen: Nasal Mucosa; Nasal Swab  Result Value Ref Range Status   MRSA by PCR Next Gen NOT DETECTED NOT DETECTED Final    Comment: (NOTE) The GeneXpert MRSA Assay (FDA approved for NASAL specimens only), is one  component of a comprehensive MRSA colonization surveillance program. It is not intended to diagnose MRSA infection nor to guide or monitor treatment for MRSA infections. Test performance is not FDA approved in patients less than 52 years old. Performed at [facility], [address] [place], [place] [address]          Radiology Studies: ECHOCARDIOGRAM COMPLETE Result Date: 03/29/2024    ECHOCARDIOGRAM REPORT   Patient Name:   Guy Roberson  [place] Date of Exam: 03/29/2024 Medical Rec #:  [id number]     Height:       67.0 in Accession #:    [id number]    Weight:       176.8 lb Date of Birth:  07-07-1931    BSA:          1.919 m Patient Age:    89 years      BP:           126/58 mmHg Patient Gender: M             HR:           63 bpm. Exam Location:  [facility] Procedure: 2D Echo, Cardiac Doppler and Color Doppler (Both Spectral and Color            Flow Doppler were utilized during procedure). Indications:    Congestive Heart Failure I50.9  History:        Patient has prior history of Echocardiogram examinations, most                 recent 02/19/2023. ASCVD and CHF, CAD, Prior CABG, CKD; Risk                 Factors:Diabetes, Hypertension and Dyslipidemia.  Sonographer:    Koleen Popper RDCS Referring Phys: [id number] [facility]  Sonographer Comments: Image acquisition challenging due to respiratory motion and Image acquisition challenging due to uncooperative patient. IMPRESSIONS  1. Left ventricular ejection fraction, by estimation, is 50%. The left ventricle has low normal function. The left ventricle has no regional wall motion abnormalities. Left ventricular diastolic parameters are indeterminate.  2. Right ventricular systolic function was not well visualized. The right ventricular size is not well visualized. There is severely elevated pulmonary artery systolic pressure.  3. The mitral valve is abnormal. Moderate mitral valve regurgitation.  4. Tricuspid valve regurgitation is moderate to severe.  5.  The aortic valve is tricuspid. There is mild calcification of the aortic valve. There is mild thickening of the aortic valve. Aortic valve regurgitation is mild. No aortic stenosis is present.  6. The inferior vena cava is normal in size with greater than 50% respiratory variability, suggesting right atrial pressure of 3 mmHg. FINDINGS  Left Ventricle: Left ventricular ejection fraction, by estimation, is 50%. The left ventricle has low normal function. The left ventricle has no regional wall motion abnormalities. The left ventricular internal cavity size was normal in size. There is no left ventricular hypertrophy. Left ventricular diastolic parameters are indeterminate. Right Ventricle: The right ventricular size is not well visualized. Right vetricular wall thickness was not well visualized. Right ventricular systolic function was not well visualized. There is severely elevated pulmonary artery systolic pressure. The tricuspid regurgitant velocity is 4.01 m/s, and with an assumed right atrial pressure of 3 mmHg, the estimated right ventricular systolic pressure is 67.3 mmHg. Left Atrium: Left atrial size was normal in size. Right Atrium: Right atrial size was normal in size. Pericardium: There is no evidence of pericardial effusion. Mitral Valve: MR vena contracta is 0.5 cm. The mitral valve is abnormal. Moderate mitral valve regurgitation. Tricuspid Valve: The tricuspid valve is not well visualized. Tricuspid valve regurgitation is moderate to severe. No evidence of tricuspid stenosis. Aortic Valve: The aortic valve is tricuspid. There is mild calcification of the aortic valve. There is mild thickening of the aortic valve. There is mild aortic valve annular calcification. Aortic valve regurgitation is mild. Aortic regurgitation PHT measures 299  msec. No aortic stenosis is present. Aortic valve mean gradient measures 2.6 mmHg. Aortic valve peak gradient measures 5.1 mmHg. Aortic valve area, by VTI measures 2.24  cm. Pulmonic Valve: The pulmonic valve was not well visualized. Pulmonic valve regurgitation is mild to moderate. No evidence of pulmonic stenosis. Aorta: The aortic root is normal in size and structure and the ascending aorta was not well visualized. Venous: The inferior vena cava is normal in size with greater than 50% respiratory variability, suggesting right atrial pressure of 3 mmHg. IAS/Shunts: No atrial level shunt detected by color flow Doppler. Additional Comments: A device lead is visualized in the right atrium and right ventricle.  LEFT VENTRICLE PLAX 2D LVIDd:         4.30 cm   Diastology LVIDs:         3.20 cm   LV e' medial:  3.90 cm/s LV PW:         1.00 cm   LV e' lateral: 5.78 cm/s LV IVS:        1.00 cm LVOT diam:     2.30 cm LV SV:         73 LV SV Index:   38 LVOT Area:     4.15 cm  RIGHT VENTRICLE          IVC RV Basal diam:  4.40 cm  IVC diam: 1.40 cm LEFT ATRIUM             Index        RIGHT ATRIUM           Index LA diam:        5.40 cm 2.81 cm/m   RA Area:     16.60 cm LA Vol (A2C):   47.7 ml 24.86 ml/m  RA Volume:   47.60 ml  24.80 ml/m LA Vol (A4C):   58.2 ml 30.33 ml/m LA Biplane Vol: 53.2 ml 27.72 ml/m  AORTIC VALVE AV Area (Vmax):    2.71 cm AV Area (Vmean):   2.54 cm AV Area (VTI):     2.24 cm AV Vmax:           112.66 cm/s AV Vmean:          76.166 cm/s AV VTI:            0.325 m AV Peak Grad:      5.1 mmHg AV Mean Grad:      2.6 mmHg LVOT Vmax:         73.40 cm/s LVOT Vmean:        46.600 cm/s LVOT VTI:          0.175 m LVOT/AV VTI ratio: 0.54 AI PHT:            299 msec  AORTA Ao Root diam: 3.30 cm MR Peak grad:    71.2 mmHg    TRICUSPID VALVE MR Vmax:         422.00 cm/s  TR Peak grad:   64.3 mmHg MR PISA:         7.60 cm     TR Mean grad:   37.0 mmHg MR PISA Eff ROA: 69 mm       TR Vmax:        401.00 cm/s MR PISA Radius:  1.10 cm      TR Vmean:       284.0 cm/s  SHUNTS                               Systemic VTI:  0.18 m                                Systemic Diam: 2.30 cm Dorn Ross MD Electronically signed by Dorn Ross MD Signature Date/Time: 03/29/2024/7:35:19 PM    Final    CT Head Wo Contrast Result Date: 03/28/2024 EXAM: CT HEAD WITHOUT CONTRAST 03/28/2024 07:12:17 PM TECHNIQUE: CT of the head was performed without the administration of intravenous contrast. Automated exposure control, iterative reconstruction, and/or weight based adjustment of the mA/kV was utilized to reduce the radiation dose to as low as reasonably achievable. COMPARISON: 01/27/2024 CLINICAL HISTORY: Mental status change, unknown cause. Pt arrived via POV c/o on-going SOB since the end of last month. Pt reports having a pacemaker generator changed on 10/9 and was told the SOB would resolve following this procedure, however family report the Pt has not made any improvements. Pts family ; also report being advised to come to the ER for evaluation of a possible UTI. FINDINGS: BRAIN AND VENTRICLES: No acute hemorrhage. No evidence of acute infarct. Moderate parenchymal volume loss with proportional prominence of ventricles and sulci. Mild periventricular white matter changes, likely sequela of chronic small vessel ischemic disease. No extra-axial collection. No mass effect or midline shift. Calcified atherosclerotic plaque in cavernous/supraclinoid ICA and intradural vertebral arteries. ORBITS: Bilateral lens replacements noted. SINUSES: No acute abnormality. SOFT TISSUES AND SKULL: No acute soft tissue abnormality. No skull fracture. IMPRESSION: 1. No acute intracranial abnormality. Electronically signed by: Morene Hoard MD 03/28/2024 07:39 PM EDT RP Workstation: [id number]   CT Angio Chest PE W/Cm &/Or Wo Cm Result Date: 03/28/2024 EXAM: CTA CHEST PE WITHOUT AND WITH CONTRAST CT ABDOMEN AND PELVIS WITHOUT AND WITH CONTRAST 03/28/2024 07:12:17 PM TECHNIQUE: CTA of the chest was performed after the administration of 80 mL of iohexol  (OMNIPAQUE ) 350  MG/ML injection. Multiplanar reformatted images are provided for review. MIP images are provided for review. CT of the abdomen and pelvis was performed without and with the administration of intravenous contrast. Automated exposure control, iterative reconstruction, and/or weight based adjustment of the mA/kV was utilized to reduce the radiation dose to as low as reasonably achievable. COMPARISON: None available. CLINICAL HISTORY: FINDINGS: CHEST: PULMONARY ARTERIES: Pulmonary arteries are adequately opacified for evaluation. No intraluminal filling defect to suggest pulmonary embolism. Main pulmonary artery is normal in caliber. MEDIASTINUM: Heart is enlarged. There are atherosclerotic calcifications of the aorta. Left-sided pacemaker is present. Thyroid gland is significantly enlarged, left greater than right and diffusely heterogeneous. Left thyroid nodule measures 2.7 cm similar to the prior study. No mediastinal lymphadenopathy. The pericardium demonstrates no acute abnormality. LUNGS AND PLEURA: There is a small right pleural effusion. Volume loss and collapse of the right middle lobe appears unchanged from prior. Stable elevation of the right hemidiaphragm with some right basilar atelectasis. No pneumothorax. SOFT TISSUES AND BONES: Sternotomy wires are present. The bones are diffusely osteopenic. ABDOMEN AND PELVIS: LIVER: The liver is unremarkable. GALLBLADDER AND BILE DUCTS: Gallstones are present. No biliary ductal dilatation. SPLEEN: Spleen demonstrates no acute abnormality. PANCREAS: Pancreas demonstrates no acute abnormality. ADRENAL GLANDS: Adrenal glands demonstrate no acute abnormality. KIDNEYS, URETERS AND BLADDER: No stones in the kidneys or ureters. No hydronephrosis. No perinephric or periureteral stranding. Urinary bladder  is unremarkable. GI AND BOWEL: There is colonic diverticulosis. Stomach and duodenal sweep demonstrate no acute abnormality. There is no bowel obstruction. No abnormal bowel  wall thickening or distension. REPRODUCTIVE: Reproductive organs are unremarkable. PERITONEUM AND RETROPERITONEUM: There is trace free fluid in the left upper quadrant. No free air. LYMPH NODES: No lymphadenopathy. BONES AND SOFT TISSUES: The visualized bones are diffusely osteopenic. No focal soft tissue abnormality. IMPRESSION: 1. No evidence of pulmonary embolism. 2. Small right pleural effusion. 3. Chronic right middle lobe volume loss/collapse and elevated right hemidiaphragm with right basilar atelectasis, unchanged from prior. 4. Enlarged thyroid gland with a 2.7 cm left thyroid nodule, similar to prior study. Follow-up thyroid ultrasound recommended if not performed. 5. Cardiomegaly. 6. Gallstones and colonic diverticulosis without evidence of diverticulitis. Electronically signed by: Greig Pique MD 03/28/2024 07:33 PM EDT RP Workstation: [id number]   CT ABDOMEN PELVIS W CONTRAST Result Date: 03/28/2024 EXAM: CTA CHEST PE WITHOUT AND WITH CONTRAST CT ABDOMEN AND PELVIS WITHOUT AND WITH CONTRAST 03/28/2024 07:12:17 PM TECHNIQUE: CTA of the chest was performed after the administration of 80 mL of iohexol  (OMNIPAQUE ) 350 MG/ML injection. Multiplanar reformatted images are provided for review. MIP images are provided for review. CT of the abdomen and pelvis was performed without and with the administration of intravenous contrast. Automated exposure control, iterative reconstruction, and/or weight based adjustment of the mA/kV was utilized to reduce the radiation dose to as low as reasonably achievable. COMPARISON: None available. CLINICAL HISTORY: FINDINGS: CHEST: PULMONARY ARTERIES: Pulmonary arteries are adequately opacified for evaluation. No intraluminal filling defect to suggest pulmonary embolism. Main pulmonary artery is normal in caliber. MEDIASTINUM: Heart is enlarged. There are atherosclerotic calcifications of the aorta. Left-sided pacemaker is present. Thyroid gland is significantly enlarged,  left greater than right and diffusely heterogeneous. Left thyroid nodule measures 2.7 cm similar to the prior study. No mediastinal lymphadenopathy. The pericardium demonstrates no acute abnormality. LUNGS AND PLEURA: There is a small right pleural effusion. Volume loss and collapse of the right middle lobe appears unchanged from prior. Stable elevation of the right hemidiaphragm with some right basilar atelectasis. No pneumothorax. SOFT TISSUES AND BONES: Sternotomy wires are present. The bones are diffusely osteopenic. ABDOMEN AND PELVIS: LIVER: The liver is unremarkable. GALLBLADDER AND BILE DUCTS: Gallstones are present. No biliary ductal dilatation. SPLEEN: Spleen demonstrates no acute abnormality. PANCREAS: Pancreas demonstrates no acute abnormality. ADRENAL GLANDS: Adrenal glands demonstrate no acute abnormality. KIDNEYS, URETERS AND BLADDER: No stones in the kidneys or ureters. No hydronephrosis. No perinephric or periureteral stranding. Urinary bladder is unremarkable. GI AND BOWEL: There is colonic diverticulosis. Stomach and duodenal sweep demonstrate no acute abnormality. There is no bowel obstruction. No abnormal bowel wall thickening or distension. REPRODUCTIVE: Reproductive organs are unremarkable. PERITONEUM AND RETROPERITONEUM: There is trace free fluid in the left upper quadrant. No free air. LYMPH NODES: No lymphadenopathy. BONES AND SOFT TISSUES: The visualized bones are diffusely osteopenic. No focal soft tissue abnormality. IMPRESSION: 1. No evidence of pulmonary embolism. 2. Small right pleural effusion. 3. Chronic right middle lobe volume loss/collapse and elevated right hemidiaphragm with right basilar atelectasis, unchanged from prior. 4. Enlarged thyroid gland with a 2.7 cm left thyroid nodule, similar to prior study. Follow-up thyroid ultrasound recommended if not performed. 5. Cardiomegaly. 6. Gallstones and colonic diverticulosis without evidence of diverticulitis. Electronically signed  by: Greig Pique MD 03/28/2024 07:33 PM EDT RP Workstation: [id number]        Scheduled Meds:  aspirin  EC  81 mg Oral Daily  Chlorhexidine Gluconate Cloth  6 each Topical Daily   furosemide   60 mg Intravenous Q12H   heparin   5,000 Units Subcutaneous Q8H   insulin  aspart  0-5 Units Subcutaneous QHS   insulin  aspart  0-6 Units Subcutaneous TID WC   lisinopril   10 mg Oral BID   metoprolol  tartrate  50 mg Oral BID   simvastatin   20 mg Oral Daily   sodium chloride  flush  3 mL Intravenous Q12H   tamsulosin   0.4 mg Oral BID   Continuous Infusions:  dextrose  50 mL/hr at 03/30/24 1538          Ernesta Trabert, MD [facility] 03/30/2024, 4:28 PM

## 2024-03-30 NOTE — Consult Note (Signed)
 Palliative Care Consult Note                                  Date: 03/30/2024   Patient Name: Guy Roberson  DOB: 14-Jan-1932  MRN: [id number]  Age / Sex: 89 y.o., male  PCP: [name], MD Referring Physician: Mcarthur Pick, MD  Reason for Consultation: Establishing goals of care  Past Medical History:  Diagnosis Date  . Arthritis   . CAD (coronary artery disease)    a.  90% LAD stenosis in 1995 treated with PTCA;  b. 09/2009 CABG x 4: LIMA->LAD, VG->Diag, VG->OM, VG->PDA;  c. 11/2011 Cath: 3VD, 4/4 patent grafts, EF 70%.  . Chronic constipation   . Colonic polyp   . Complete heart block (HCC)    a. 11/2011 s/p MDT Adapta L ADDRL 1 Ser # [id number] H.  . Complication of anesthesia    PROBLEMS VOIDING AFTER PREVIOUS SURGERIES  . Diabetes mellitus without complication (HCC)    NOT ON ANY DIABETIC MEDICATIONS  . Fasting hyperglycemia   . GERD (gastroesophageal reflux disease)   . Hyperlipidemia   . Hypertension   . Pacemaker 11/2011   DR.  G.TAYLOR AND DR. DOROTHA RAKERS  . Pseudophakia of both eyes   . Seizures (HCC)    a. prior to diagnosis of heart block and syncope, this was in the differential and pt was briefly on Keppra , started by ER MD.  . Syncope    a. 11/2011 18 second pauses noted on Event Monitor assoc w/ syncope  . Trauma 1952   Abdominal and chest in 1952; multiple injuries including pelvic fracture, rib fractures and ruptured bladder  . Urinary retention    PT HAS INDWELLING FOLEY CATHETER    Subjective:   This NP Camellia Kays reviewed medical records, received report from team, assessed the patient and then meet at the patient's bedside to discuss diagnosis, prognosis, GOC, EOL wishes disposition and options.  Before meeting with the patient/family, I spent time reviewing the chart notes including ED nursing notes and triage note, ED provider notes, admission H&P from 03/28/2024; nursing notes from yesterday, hospice  note from yesterday, PT note from yesterday, TOC note from yesterday, nursing note from today. I also reviewed vital signs, nursing flowsheets, medication administrations record, labs, and imaging. Labs reviewed include BMP today showing normal electrolytes, stable/baseline creatinine at 1.59 in the setting of acute on chronic heart failure with risk for endorgan damage.  I met with the patient at bedside, no family is present.  He is unable to have a meaningful conversation.  After seeing the patient I called and spoke with his son Guy Roberson who requested I speak primarily with Guy Roberson (patient's daughter-in-law)..   We meet to discuss diagnosis prognosis, GOC, EOL wishes, disposition and options. Concept of Palliative Care was introduced as specialized medical care for people and their families living with serious illness.  If focuses on providing relief from the symptoms and stress of a serious illness.  The goal is to improve quality of life for both the patient and the family. Values and goals of care important to patient and family were attempted to be elicited.  Created space and opportunity for patient  and family to explore thoughts and feelings regarding current medical situation   Natural trajectory and current clinical status were discussed. Questions and concerns addressed. Patient  encouraged to call with questions or concerns.  Patient/Family Understanding of Illness: They understand that he has heart failure and a lot of fluid on him.  They are not sure why he had an exacerbation of heart failure.  Initially because of his confusion they thought he had a UTI or other infection but that has been ruled out.  He was getting progressively confused since last weekend.  Around the time he started having confusion he was having multiple episodes of diarrhea, but was able to still get to the bathroom and toilet himself/change himself.  The confusion progressed, some hallucinations,  etc. over the past 5 or 6 days.  She also tells that he was admitted in August of this year for UTI and went to the [facility] for rehab, now getting rehab at home.  However, he does not often cooperate with rehab stating I am 89 and I will do what I want.  We spent time talking about his chronic health, progressive weakness, and acute illness resulting in hospitalization.  Life Review: The patient lives with his wife alone, although the patient's son Guy Roberson and his wife Guy Roberson live across the street.  His family spent significant amount of time, daily, at the patient's home assisting.  Baseline Status: Prior to illness he is oriented, having conversation.  He uses a wheelchair frequently to ambulate, although it is not necessarily needed but he feels more comfortable using it.  He is able to ambulate with a walker.  He generally has a good appetite.  He has caregiver assistance for dressing and bathing, although this is more for his comfort level rather than necessity.  Today's Discussion: In addition to discussions described above we had extensive discussion on various topics.  We spent time talking about his illness, acute illness, recent hospitalizations.  The patient and his wife live alone, although family lives across the street and they are very involved.  He recently had his pacemaker battery changed out and they told him that he would be a new man afterward, although family does not feel this is materialized.  They did have the pacemaker checked and an EKG done and everything looked good according to cardiology.  They are not sure what is resulted in his heart failure exacerbation.  His family describes him as somewhat stubborn in his old age stating I am 78 I will do it I want.  They try to be diligent about keeping him on the appropriate diet, therapy for continued strength and function, etc.  However, we talked about increased sodium intake could possibly result in heart failure.  They  are a bit frustrated with their stay at the [facility] when they kept asking for him to be on a low-salt diet but I Providing him a regular diet and he subsequently gained weight.  We talked about the link between diet restrictions and exacerbation of heart failure.  I discussed that TOC notes that PT recommends SNF/rehab.  At this time they are open to SNF/rehab at discharge, though they would like to see how he does in the coming days before making a final commitment.  We talked about CODE STATUS and they confirmed DNR-limited (DNR/DNI).  We talked about options moving forward and decided to allow time for outcomes to see how he does.  I shared that we can have ongoing goals of care conversations pending evolution of his clinical picture in the hospital.  If he continues to improve, then discharge to SNF/rehab and eventually home are feasible.  However, if he has a  progressive decline in the hospital stay we might have to have further conversations about other options including possible comfort care.  Family is understanding.  I provided palliative care contact information for any questions or concerns while admitted.  I shared that palliative medicine would be off for the next 3 days but a provider would be back on Monday and available to check in at that time.  I provided emotional and general support through therapeutic listening, empathy, sharing of stories, and other techniques. I answered all questions and addressed all concerns to the best of my ability.  Goals: DNR/DNI, continue current scope of care, time for outcomes, ongoing GOC conversations pending evolution of his clinical picture.  Review of Systems  Constitutional:        Denies pain in general  Respiratory:  Negative for shortness of breath.   Gastrointestinal:  Negative for abdominal pain, nausea and vomiting.    Objective:   Primary Diagnoses: Present on Admission: . Acute on chronic heart failure with preserved ejection  fraction (HFpEF) (HCC) . Type 2 diabetes mellitus with other circulatory complications (HCC) . Essential hypertension . CAD (coronary artery disease) . CKD stage 3a, GFR 45-59 ml/min (HCC) . Acute metabolic encephalopathy . Thrombocytopenia   Vital Signs:  BP (!) 142/59   Pulse 83   Temp (!) 97.1 F (36.2 C) (Rectal)   Resp 14   Ht 5' 7 (1.702 m)   Wt 76.8 kg   SpO2 91%   BMI 26.52 kg/m   Physical Exam Vitals and nursing note reviewed.  Constitutional:      General: He is sleeping. He is not in acute distress. HENT:     Head: Normocephalic and atraumatic.  Cardiovascular:     Rate and Rhythm: Normal rate.  Pulmonary:     Effort: Pulmonary effort is normal. No respiratory distress.     Breath sounds: No wheezing or rhonchi.  Abdominal:     General: Abdomen is flat.     Palpations: Abdomen is soft.  Skin:    General: Skin is warm and dry.  Neurological:     General: No focal deficit present.     Mental Status: He is easily aroused. He is disoriented and confused.     Comments: Oriented to person; disoriented to time (year), place, situation  Psychiatric:        Mood and Affect: Mood normal.        Behavior: Behavior normal.     Palliative Assessment/Data: 40%   Advanced Care Planning:   Existing Vynca/ACP Documentation: None  Primary Decision Maker: NEXT OF KIN  Pertinent diagnosis: Respiratory failure, acute on chronic heart failure  The patient and/or family consented to a voluntary Advance Care Planning Conversation over the phone. Individuals present for the conversation: Patient's son Guy Roberson; patient's daughter-in-law Guy Roberson; Camellia Kays, NP  Summary of the conversation: We discussed chronic illness, recent confusion, functional status, prognosis and likely further decline over time.  We discussed CODE STATUS, options moving forward from continued full and aggressive care through comfort care and multiple points in between.  Outcome of the conversations  and/or documents completed: DNR-limited (DNR/DNI), continue current scope of care, work toward discharge, open to SNF/rehab, ongoing GOC conversations pending evolution of his clinical picture  I spent 20 minutes providing separately identifiable ACP services with the patient and/or surrogate decision maker in a voluntary, in-person conversation discussing the patient's wishes and goals as detailed in the above note.  Assessment & Plan:   HPI/Patient Profile:  89 y.o. male  with past medical history of  hypertension, type 2 diabetes mellitus, CAD status post CABG, complete heart block with pacemaker, CKD 3A, chronic HFpEF, and BPH who presents with shortness of breath and increased confusion. He was admitted on 03/28/2024 with acute on chronic HFpEF, acute encephalopathy, thrombocytopenia, CKD 3A, and others.   Palliative medicine was consulted for GOC conversations.  SUMMARY OF RECOMMENDATIONS   DNR-Limited Continue current scope of care Time for outcomes Open to SNF/rehab, pending how he does in the coming days Palliative medicine is available to follow-up next week  Symptom Management:  Per primary team Palliative medicine is available to assist as needed  Code Status: DNR - Limited (DNR/DNI)  Prognosis:  Unable to determine  Discharge Planning:  To Be Determined   Discussed with: Patient, family, medical team, nursing team, [facility] team    Thank you for allowing us  to participate in the care of OSMEL DYKSTRA PMT will continue to support holistically.  Billing based on MDM: High  Problems Addressed: One acute or chronic illness or injury that poses a threat to life or bodily function  Amount and/or Complexity of Data: Category 1:Review of prior external note(s) from each unique source, Review of the result(s) of each unique test, and Assessment requiring an independent historian(s) and Category 3:Discussion of management or test interpretation with external physician/other  qualified health care professional/appropriate source (not separately reported)  Risks: N/A  Detailed review of medical records (labs, imaging, vital signs), medically appropriate exam, discussed with treatment team, counseling and education to patient, family, & staff, documenting clinical information, medication management, coordination of care  Signed by: Camellia Kays, NP Palliative Medicine Team  Team Phone # [phone] (Nights/Weekends)  03/30/2024, 12:31 PM

## 2024-03-30 NOTE — Plan of Care (Signed)
  Problem: Acute Rehab PT Goals(only PT should resolve) Goal: Pt Will Go Supine/Side To Sit Outcome: Progressing Flowsheets (Taken 03/30/2024 1209) Pt will go Supine/Side to Sit:  with contact guard assist  with minimal assist Goal: Pt Will Go Sit To Supine/Side Outcome: Progressing Flowsheets (Taken 03/30/2024 1209) Pt will go Sit to Supine/Side:  with contact guard assist  with minimal assist Goal: Patient Will Perform Sitting Balance Outcome: Progressing Flowsheets (Taken 03/30/2024 1209) Patient will perform sitting balance:  with contact guard assist  with minimal assist Goal: Patient Will Transfer Sit To/From Stand Outcome: Progressing Flowsheets (Taken 03/30/2024 1209) Patient will transfer sit to/from stand: with minimal assist Goal: Pt Will Transfer Bed To Chair/Chair To Bed Outcome: Progressing Flowsheets (Taken 03/30/2024 1209) Pt will Transfer Bed to Chair/Chair to Bed:  with min assist  with mod assist Note: With RW Goal: Pt Will Perform Standing Balance Or Pre-Gait Outcome: Progressing Flowsheets (Taken 03/30/2024 1209) Pt will perform standing balance or pre-gait:  with minimal assist  with moderate assist Note: With RW   Problem: Acute Rehab PT Goals(only PT should resolve) Goal: Pt Will Go Supine/Side To Sit Outcome: Progressing Flowsheets (Taken 03/30/2024 1209) Pt will go Supine/Side to Sit:  with contact guard assist  with minimal assist Goal: Pt Will Go Sit To Supine/Side Outcome: Progressing Flowsheets (Taken 03/30/2024 1209) Pt will go Sit to Supine/Side:  with contact guard assist  with minimal assist Goal: Patient Will Perform Sitting Balance Outcome: Progressing Flowsheets (Taken 03/30/2024 1209) Patient will perform sitting balance:  with contact guard assist  with minimal assist Goal: Patient Will Transfer Sit To/From Stand Outcome: Progressing Flowsheets (Taken 03/30/2024 1209) Patient will transfer sit to/from stand: with  minimal assist Goal: Pt Will Transfer Bed To Chair/Chair To Bed Outcome: Progressing Flowsheets (Taken 03/30/2024 1209) Pt will Transfer Bed to Chair/Chair to Bed:  with min assist  with mod assist Note: With RW Goal: Pt Will Perform Standing Balance Or Pre-Gait Outcome: Progressing Flowsheets (Taken 03/30/2024 1209) Pt will perform standing balance or pre-gait:  with minimal assist  with moderate assist Note: With RW Problem: Acute Rehab PT Goals(only PT should resolve) Goal: Pt Will Go Supine/Side To Sit Outcome: Progressing Flowsheets (Taken 03/30/2024 1209) Pt will go Supine/Side to Sit:  with contact guard assist  with minimal assist Goal: Pt Will Go Sit To Supine/Side Outcome: Progressing Flowsheets (Taken 03/30/2024 1209) Pt will go Sit to Supine/Side:  with contact guard assist  with minimal assist Goal: Patient Will Perform Sitting Balance Outcome: Progressing Flowsheets (Taken 03/30/2024 1209) Patient will perform sitting balance:  with contact guard assist  with minimal assist Goal: Patient Will Transfer Sit To/From Stand Outcome: Progressing Flowsheets (Taken 03/30/2024 1209) Patient will transfer sit to/from stand: with minimal assist Goal: Pt Will Transfer Bed To Chair/Chair To Bed Outcome: Progressing Flowsheets (Taken 03/30/2024 1209) Pt will Transfer Bed to Chair/Chair to Bed:  with min assist  with mod assist Note: With RW Goal: Pt Will Perform Standing Balance Or Pre-Gait Outcome: Progressing Flowsheets (Taken 03/30/2024 1209) Pt will perform standing balance or pre-gait:  with minimal assist  with moderate assist Note: With RW   Problem: Acute Rehab PT Goals(only PT should resolve) Goal: Pt Will Ambulate Flowsheets (Taken 03/30/2024 1210) Pt will Ambulate:  10 feet  with minimal assist Note: W/RW  Alyda Megna, SPT

## 2024-03-31 DIAGNOSIS — I5033 Acute on chronic diastolic (congestive) heart failure: Secondary | ICD-10-CM | POA: Diagnosis not present

## 2024-03-31 LAB — CBC
HCT: 35.1 % — ABNORMAL LOW (ref 39.0–52.0)
Hemoglobin: 11.1 g/dL — ABNORMAL LOW (ref 13.0–17.0)
MCH: 29.4 pg (ref 26.0–34.0)
MCHC: 31.6 g/dL (ref 30.0–36.0)
MCV: 93.1 fL (ref 80.0–100.0)
Platelets: 89 K/uL — ABNORMAL LOW (ref 150–400)
RBC: 3.77 MIL/uL — ABNORMAL LOW (ref 4.22–5.81)
RDW: 15.5 % (ref 11.5–15.5)
WBC: 7.5 K/uL (ref 4.0–10.5)
nRBC: 0 % (ref 0.0–0.2)

## 2024-03-31 LAB — BASIC METABOLIC PANEL WITH GFR
Anion gap: 14 (ref 5–15)
BUN: 45 mg/dL — ABNORMAL HIGH (ref 8–23)
CO2: 29 mmol/L (ref 22–32)
Calcium: 10.9 mg/dL — ABNORMAL HIGH (ref 8.9–10.3)
Chloride: 97 mmol/L — ABNORMAL LOW (ref 98–111)
Creatinine, Ser: 1.99 mg/dL — ABNORMAL HIGH (ref 0.61–1.24)
GFR, Estimated: 31 mL/min — ABNORMAL LOW (ref >60)
Glucose, Bld: 108 mg/dL — ABNORMAL HIGH (ref 70–99)
Potassium: 3.9 mmol/L (ref 3.5–5.1)
Sodium: 139 mmol/L (ref 135–145)

## 2024-03-31 LAB — GLUCOSE, CAPILLARY
Glucose-Capillary: 120 mg/dL — ABNORMAL HIGH (ref 70–99)
Glucose-Capillary: 134 mg/dL — ABNORMAL HIGH (ref 70–99)
Glucose-Capillary: 101 mg/dL — ABNORMAL HIGH (ref 70–99)
Glucose-Capillary: 91 mg/dL (ref 70–99)

## 2024-03-31 LAB — CALCIUM, IONIZED: Calcium, Ionized, Serum: 5.7 mg/dL — ABNORMAL HIGH (ref 4.5–5.6)

## 2024-03-31 NOTE — Care Management Important Message (Signed)
 Important Message  Patient Details  Name: ALBARAA SWINGLE MRN: [id number] Date of Birth: 04-Apr-1932   Important Message Given:  Yes - Medicare IM     Kysha Muralles L Dimitrious Micciche 03/31/2024, 4:23 PM

## 2024-03-31 NOTE — Progress Notes (Signed)
 PROGRESS NOTE    Guy Roberson  FMW:[id number] DOB: February 07, 1932 DOA: 03/28/2024 PCP: Guy Norleen PEDLAR, MD   Brief Narrative:   Guy Roberson is a 89 y.o. male with medical history significant for hypertension, type 2 diabetes mellitus, CAD status post CABG, complete heart block with pacemaker, CKD 3A, chronic HFpEF, and BPH who presents with shortness of breath and increased confusion.   Patient is brought in by his son who is concerned that the patient has had worsening shortness of breath over the course of a month or so.  There has not been much cough and no fever has been documented.  Patient has had increased lower extremity swelling and was unable to get his shoes on today due to this.  Shortness of breath was worse last night and did not improve with albuterol  MDI.  Family also notes that the patient seems to be hallucinating and more confused than usual.   ED Course: Upon arrival to the ED, patient is found to be afebrile and saturating mid 90s on room air with stable BP.  Labs are most notable for creatinine 1.32, normal WBC, platelets 107,000, and proBNP 3949.  CTA chest is negative for PE but notable for small right pleural effusion and chronic RML volume loss.  There are no acute findings on head CT or CT of the abdomen and pelvis.   Patient admitted for CHF exacerbation/encephalopathy   Assessment & Plan:   Principal Problem:   Acute on chronic heart failure with preserved ejection fraction (HFpEF) (HCC) Active Problems:   Type 2 diabetes mellitus with other circulatory complications (HCC)   Essential hypertension   CAD (coronary artery disease)   CKD stage 3a, GFR 45-59 ml/min (HCC)   Acute metabolic encephalopathy   Thrombocytopenia  1. Acute on chronic HFpEF  - EF was 55-60% on echo from September 2024.   -Echo/LVEF 50%.  Severely elevated RVSP.  CT shows no PE - Will continue to diurese with IV Lasix  60 twice daily, monitor weight, intake and output, daily electrolytes,  BMP - EKG with paced rhythm - Creatinine slowly rising 1.9 today.  Will hold off on diuretics today  2.  Severe valvular disease, moderate AI, MR, moderate to severe TR, severe pulmonary hypertension. -Continue diuretics, beta-blockers.  Not a surgical candidate   2. Acute encephalopathy - Family is concerned that patient has been more disoriented lately  - No acute findings on head CT, no infectious s/s, CT chest abdomen and pelvis unremarkable to suggest infection.  UA is negative too -TSH, ammonia within normal limits, RPR, B12 pending. -Follow delirium precautions. - Will recheck UA as family is concerned about UTI because of the presentation.  UA positive for leukocytes esterase but white count 0-5.  Unlikely UTI.  3.  Hypercalcemia: Calcium  10.7.  Zometa given 10/30.Guy Roberson  Avoiding IV fluid due to CHF.   3. Thrombocytopenia  - Platelets 107,000 on admission, down from 112k on 03/09/24  - LFTs are normal; WBC is normal and there is a mild normocytic anemia   - Request smear review, repeat CBC in am     4. CAD /PPM - Hx of CABG in 2011  - No anginal symptoms  - Continue Zocor , ASA, and metoprolol      5. Type II DM /hypoglycemia - A1c was 6.7% in August 2025  - Check CBGs and use low-intensity SSI for now   -IV dextrose  infusion was started 10/29 due to recurrent low blood sugars.  Will continue it.  6. CKD 3A  - Appears close to baseline  - Renally-dose medications       DVT prophylaxis: sq heparin   Code Status: DNR/DNI Level of Care: Level of care: Telemetry Family Communication: Son, daughter at bedside  Disposition Plan:  Patient is from: home  Anticipated d/c is to: TBD Patient currently: Pending improved volume status, PT eval, disposition planning  Consults called: None  Admission status: Inpatient     Subjective: Patient seen and examined at the bedside.  He is somnolent.  Confused.  Vital signs are stable.  He is afebrile. Objective: Vitals:   03/30/24 1623  03/30/24 1719 03/30/24 2159 03/31/24 0425  BP: 134/75 133/69 131/83 (!) 150/83  Pulse: 74 73 79 88  Resp: 18 16 20 20   Temp: 98.3 F (36.8 C) 97.7 F (36.5 C) (!) 97.5 F (36.4 C) 98 F (36.7 C)  TempSrc: Rectal Oral Oral Oral  SpO2: 95% 96% 92% 98%  Weight:    77.5 kg  Height:        Intake/Output Summary (Last 24 hours) at 03/31/2024 0748 Last data filed at 03/31/2024 0517 Gross per 24 hour  Intake 794.18 ml  Output 400 ml  Net 394.18 ml   Filed Weights   03/29/24 0500 03/30/24 0553 03/31/24 0425  Weight: 80.2 kg 76.8 kg 77.5 kg    Examination:  General: Lethargic, arousable Chest: Diminished bilaterally, bilateral crackles CVs: S1, S2, no murmur, regular rhythm Abdomen: Soft, nontender, no organomegaly Extremities: Bilateral peripheral edema   Data Reviewed: I have personally reviewed following labs and imaging studies  CBC: Recent Labs  Lab 03/28/24 1543 03/29/24 0445 03/30/24 0440 03/31/24 0420  WBC 5.5 5.5 6.3 7.5  NEUTROABS 3.9  --   --   --   HGB 11.3* 10.8* 10.8* 11.1*  HCT 34.8* 33.5* 33.7* 35.1*  MCV 92.3 93.3 93.1 93.1  PLT 107* 101* 101* 89*   Basic Metabolic Panel: Recent Labs  Lab 03/28/24 1543 03/29/24 0445 03/30/24 0440 03/31/24 0420  NA 135 139 140 139  K 4.9 4.1 3.7 3.9  CL 100 100 99 97*  CO2 25 27 26 29   GLUCOSE 87 72 72 108*  BUN 38* 39* 37* 45*  CREATININE 1.32* 1.46* 1.59* 1.99*  CALCIUM  10.6* 10.7* 10.9* 10.9*  MG 2.3 2.3  --   --    GFR: Estimated Creatinine Clearance: 23.1 mL/min (A) (by C-G formula based on SCr of 1.99 mg/dL (H)). Liver Function Tests: Recent Labs  Lab 03/28/24 1543  AST 27  ALT 20  ALKPHOS 108  BILITOT 0.4  PROT 7.0  ALBUMIN 4.5   No results for input(s): LIPASE, AMYLASE in the last 168 hours. Recent Labs  Lab 03/28/24 2045  AMMONIA 32   Coagulation Profile: Recent Labs  Lab 03/29/24 0445  INR 1.1   Cardiac Enzymes: No results for input(s): CKTOTAL, CKMB, CKMBINDEX,  TROPONINI in the last 168 hours. BNP (last 3 results) Recent Labs    03/28/24 1543  PROBNP 3,949.0*   HbA1C: No results for input(s): HGBA1C in the last 72 hours. CBG: Recent Labs  Lab 03/30/24 0727 03/30/24 1124 03/30/24 1627 03/30/24 2152 03/31/24 0723  GLUCAP 72 102* 110* 117* 120*   Lipid Profile: No results for input(s): CHOL, HDL, LDLCALC, TRIG, CHOLHDL, LDLDIRECT in the last 72 hours. Thyroid Function Tests: Recent Labs    03/28/24 1543  TSH 1.840   Anemia Panel: Recent Labs    03/28/24 1543  VITAMINB12 982*   Sepsis Labs: No  results for input(s): PROCALCITON, LATICACIDVEN in the last 168 hours.  Recent Results (from the past 240 hours)  Resp panel by RT-PCR (RSV, Flu A&B, Covid) Anterior Nasal Swab     Status: None   Collection Time: 03/28/24  3:32 PM   Specimen: Anterior Nasal Swab  Result Value Ref Range Status   SARS Coronavirus 2 by RT PCR NEGATIVE NEGATIVE Final    Comment: (NOTE) SARS-CoV-2 target nucleic acids are NOT DETECTED.  The SARS-CoV-2 RNA is generally detectable in upper respiratory specimens during the acute phase of infection. The lowest concentration of SARS-CoV-2 viral copies this assay can detect is 138 copies/mL. A negative result does not preclude SARS-Cov-2 infection and should not be used as the sole basis for treatment or other patient management decisions. A negative result may occur with  improper specimen collection/handling, submission of specimen other than nasopharyngeal swab, presence of viral mutation(s) within the areas targeted by this assay, and inadequate number of viral copies(<138 copies/mL). A negative result must be combined with clinical observations, patient history, and epidemiological information. The expected result is Negative.  Fact Sheet for Patients:  bloggercourse.com  Fact Sheet for Healthcare Providers:   seriousbroker.it  This test is no t yet approved or cleared by the [place]  FDA and  has been authorized for detection and/or diagnosis of SARS-CoV-2 by FDA under an Emergency Use Authorization (EUA). This EUA will remain  in effect (meaning this test can be used) for the duration of the COVID-19 declaration under Section 564(b)(1) of the Act, 21 U.S.C.section 360bbb-3(b)(1), unless the authorization is terminated  or revoked sooner.       Influenza A by PCR NEGATIVE NEGATIVE Final   Influenza B by PCR NEGATIVE NEGATIVE Final    Comment: (NOTE) The Xpert Xpress SARS-CoV-2/FLU/RSV plus assay is intended as an aid in the diagnosis of influenza from Nasopharyngeal swab specimens and should not be used as a sole basis for treatment. Nasal washings and aspirates are unacceptable for Xpert Xpress SARS-CoV-2/FLU/RSV testing.  Fact Sheet for Patients: bloggercourse.com  Fact Sheet for Healthcare Providers: seriousbroker.it  This test is not yet approved or cleared by the [place]  FDA and has been authorized for detection and/or diagnosis of SARS-CoV-2 by FDA under an Emergency Use Authorization (EUA). This EUA will remain in effect (meaning this test can be used) for the duration of the COVID-19 declaration under Section 564(b)(1) of the Act, 21 U.S.C. section 360bbb-3(b)(1), unless the authorization is terminated or revoked.     Resp Syncytial Virus by PCR NEGATIVE NEGATIVE Final    Comment: (NOTE) Fact Sheet for Patients: bloggercourse.com  Fact Sheet for Healthcare Providers: seriousbroker.it  This test is not yet approved or cleared by the [place]  FDA and has been authorized for detection and/or diagnosis of SARS-CoV-2 by FDA under an Emergency Use Authorization (EUA). This EUA will remain in effect (meaning this test can be used) for  the duration of the COVID-19 declaration under Section 564(b)(1) of the Act, 21 U.S.C. section 360bbb-3(b)(1), unless the authorization is terminated or revoked.  Performed at [facility], [address], [place], [place] [address]   MRSA Next Gen by PCR, Nasal     Status: None   Collection Time: 03/29/24 12:00 AM   Specimen: Nasal Mucosa; Nasal Swab  Result Value Ref Range Status   MRSA by PCR Next Gen NOT DETECTED NOT DETECTED Final    Comment: (NOTE) The GeneXpert MRSA Assay (FDA approved for NASAL specimens only), is one component  of a comprehensive MRSA colonization surveillance program. It is not intended to diagnose MRSA infection nor to guide or monitor treatment for MRSA infections. Test performance is not FDA approved in patients less than 57 years old. Performed at [facility], [address], [place], [place] [address]          Radiology Studies: ECHOCARDIOGRAM COMPLETE Result Date: 03/29/2024    ECHOCARDIOGRAM REPORT   Patient Name:   Guy Roberson Date of Exam: 03/29/2024 Medical Rec #:  [id number]     Height:       67.0 in Accession #:    [id number]    Weight:       176.8 lb Date of Birth:  1931/09/30    BSA:          1.919 m Patient Age:    89 years      BP:           126/58 mmHg Patient Gender: M             HR:           63 bpm. Exam Location:  [facility] Procedure: 2D Echo, Cardiac Doppler and Color Doppler (Both Spectral and Color            Flow Doppler were utilized during procedure). Indications:    Congestive Heart Failure I50.9  History:        Patient has prior history of Echocardiogram examinations, most                 recent 02/19/2023. ASCVD and CHF, CAD, Prior CABG, CKD; Risk                 Factors:Diabetes, Hypertension and Dyslipidemia.  Sonographer:    Koleen Popper RDCS Referring Phys: [id number] [facility] [place]  Sonographer Comments: Image acquisition challenging due to respiratory motion and Image acquisition challenging due to uncooperative patient.  IMPRESSIONS  1. Left ventricular ejection fraction, by estimation, is 50%. The left ventricle has low normal function. The left ventricle has no regional wall motion abnormalities. Left ventricular diastolic parameters are indeterminate.  2. Right ventricular systolic function was not well visualized. The right ventricular size is not well visualized. There is severely elevated pulmonary artery systolic pressure.  3. The mitral valve is abnormal. Moderate mitral valve regurgitation.  4. Tricuspid valve regurgitation is moderate to severe.  5. The aortic valve is tricuspid. There is mild calcification of the aortic valve. There is mild thickening of the aortic valve. Aortic valve regurgitation is mild. No aortic stenosis is present.  6. The inferior vena cava is normal in size with greater than 50% respiratory variability, suggesting right atrial pressure of 3 mmHg. FINDINGS  Left Ventricle: Left ventricular ejection fraction, by estimation, is 50%. The left ventricle has low normal function. The left ventricle has no regional wall motion abnormalities. The left ventricular internal cavity size was normal in size. There is no left ventricular hypertrophy. Left ventricular diastolic parameters are indeterminate. Right Ventricle: The right ventricular size is not well visualized. Right vetricular wall thickness was not well visualized. Right ventricular systolic function was not well visualized. There is severely elevated pulmonary artery systolic pressure. The tricuspid regurgitant velocity is 4.01 m/s, and with an assumed right atrial pressure of 3 mmHg, the estimated right ventricular systolic pressure is 67.3 mmHg. Left Atrium: Left atrial size was normal in size. Right Atrium: Right atrial size was normal in size. Pericardium: There is no evidence of pericardial effusion. Mitral  Valve: MR vena contracta is 0.5 cm. The mitral valve is abnormal. Moderate mitral valve regurgitation. Tricuspid Valve: The tricuspid  valve is not well visualized. Tricuspid valve regurgitation is moderate to severe. No evidence of tricuspid stenosis. Aortic Valve: The aortic valve is tricuspid. There is mild calcification of the aortic valve. There is mild thickening of the aortic valve. There is mild aortic valve annular calcification. Aortic valve regurgitation is mild. Aortic regurgitation PHT measures 299 msec. No aortic stenosis is present. Aortic valve mean gradient measures 2.6 mmHg. Aortic valve peak gradient measures 5.1 mmHg. Aortic valve area, by VTI measures 2.24 cm. Pulmonic Valve: The pulmonic valve was not well visualized. Pulmonic valve regurgitation is mild to moderate. No evidence of pulmonic stenosis. Aorta: The aortic root is normal in size and structure and the ascending aorta was not well visualized. Venous: The inferior vena cava is normal in size with greater than 50% respiratory variability, suggesting right atrial pressure of 3 mmHg. IAS/Shunts: No atrial level shunt detected by color flow Doppler. Additional Comments: A device lead is visualized in the right atrium and right ventricle.  LEFT VENTRICLE PLAX 2D LVIDd:         4.30 cm   Diastology LVIDs:         3.20 cm   LV e' medial:  3.90 cm/s LV PW:         1.00 cm   LV e' lateral: 5.78 cm/s LV IVS:        1.00 cm LVOT diam:     2.30 cm LV SV:         73 LV SV Index:   38 LVOT Area:     4.15 cm  RIGHT VENTRICLE          IVC RV Basal diam:  4.40 cm  IVC diam: 1.40 cm LEFT ATRIUM             Index        RIGHT ATRIUM           Index LA diam:        5.40 cm 2.81 cm/m   RA Area:     16.60 cm LA Vol (A2C):   47.7 ml 24.86 ml/m  RA Volume:   47.60 ml  24.80 ml/m LA Vol (A4C):   58.2 ml 30.33 ml/m LA Biplane Vol: 53.2 ml 27.72 ml/m  AORTIC VALVE AV Area (Vmax):    2.71 cm AV Area (Vmean):   2.54 cm AV Area (VTI):     2.24 cm AV Vmax:           112.66 cm/s AV Vmean:          76.166 cm/s AV VTI:            0.325 m AV Peak Grad:      5.1 mmHg AV Mean Grad:      2.6  mmHg LVOT Vmax:         73.40 cm/s LVOT Vmean:        46.600 cm/s LVOT VTI:          0.175 m LVOT/AV VTI ratio: 0.54 AI PHT:            299 msec  AORTA Ao Root diam: 3.30 cm MR Peak grad:    71.2 mmHg    TRICUSPID VALVE MR Vmax:         422.00 cm/s  TR Peak grad:   64.3 mmHg MR PISA:  7.60 cm     TR Mean grad:   37.0 mmHg MR PISA Eff ROA: 69 mm       TR Vmax:        401.00 cm/s MR PISA Radius:  1.10 cm      TR Vmean:       284.0 cm/s                                SHUNTS                               Systemic VTI:  0.18 m                               Systemic Diam: 2.30 cm Dorn Ross MD Electronically signed by Dorn Ross MD Signature Date/Time: 03/29/2024/7:35:19 PM    Final         Scheduled Meds:  aspirin  EC  81 mg Oral Daily   Chlorhexidine Gluconate Cloth  6 each Topical Daily   heparin   5,000 Units Subcutaneous Q8H   metoprolol  tartrate  50 mg Oral BID   simvastatin   20 mg Oral Daily   sodium chloride  flush  3 mL Intravenous Q12H   tamsulosin   0.4 mg Oral BID   Continuous Infusions:  dextrose  50 mL/hr at 03/30/24 2102          Derryl Duval, MD [facility] 03/31/2024, 7:48 AM

## 2024-03-31 NOTE — Progress Notes (Addendum)
 Physical Therapy Treatment Patient Details Name: TARL CEPHAS MRN: [id number] DOB: [birth year] Today's Date: 03/31/2024   History of Present Illness ESLEY BROOKING is a 89 y.o. male with medical history significant for hypertension, type 2 diabetes mellitus, CAD status post CABG, complete heart block with pacemaker, CKD 3A, chronic HFpEF, and BPH who presents with shortness of breath and increased confusion.     Patient is brought in by his son who is concerned that the patient has had worsening shortness of breath over the course of a month or so.  There has not been much cough and no fever has been documented.  Patient has had increased lower extremity swelling and was unable to get his shoes on today due to this.  Shortness of breath was worse last night and did not improve with albuterol  MDI.  Family also notes that the patient seems to be hallucinating and more confused than usual.    PT Comments  Pt presented very lethargic today during treatment, Pt had difficulty understanding commands during treatment, pt needed help with initiating movement and required Max A with bed mobility. Pt was unable to transfer from bed to chair and is non-ambulatory due to current state. Pt. Was left in bed with call bell. Nursing staff was notified. Patient will benefit from continued skilled physical therapy in hospital and recommended venue below to continue to  increase strength, balance, endurance for safe ADLs and gait.    If plan is discharge home, recommend the following: Assistance with cooking/housework;Assist for transportation;Assistance with feeding;Help with stairs or ramp for entrance;Two people to help with walking and/or transfers;Two people to help with bathing/dressing/bathroom   Can travel by private vehicle     No  Equipment Recommendations  None recommended by PT    Recommendations for Other Services       Precautions / Restrictions Precautions Precautions: Fall Recall of  Precautions/Restrictions: Intact Restrictions Weight Bearing Restrictions Per Provider Order: No     Mobility  Bed Mobility Overal bed mobility: Needs Assistance Bed Mobility: Supine to Sit, Rolling Rolling: Max assist   Supine to sit: HOB elevated, Max assist     General bed mobility comments: Pt. was able to get to EOB of w/o Max A    Transfers                        Ambulation/Gait                   Stairs             Wheelchair Mobility     Tilt Bed    Modified Rankin (Stroke Patients Only)       Balance Overall balance assessment: Needs assistance   Sitting balance-Leahy Scale: Zero Sitting balance - Comments: Zero Pt. could maintain balance w/o Max A/ Total assist Postural control: Left lateral lean                                  Communication Communication Communication: Impaired Factors Affecting Communication: Hearing impaired;Other (comment)  Cognition Arousal: Lethargic Behavior During Therapy: Agitated   PT - Cognitive impairments: Awareness, Memory                       PT - Cognition Comments: Diffculty with understand and doing commands without cues, and repetition. Following commands: Impaired Following commands impaired: Follows one  step commands with increased time    Cueing Cueing Techniques: Verbal cues, Gestural cues, Tactile cues, Visual cues  Exercises General Exercises - Lower Extremity Straight Leg Raises: PROM, Seated, Strengthening, Both, 5 reps, Other (comment) (Pt needed assistance with starting movement.)    General Comments        Pertinent Vitals/Pain Pain Assessment Pain Assessment: No/denies pain    Home Living                          Prior Function            PT Goals (current goals can now be found in the care plan section) Acute Rehab PT Goals Patient Stated Goal: Pt. wants to go SNF to improve mobility and endurance PT Goal Formulation: With  family Time For Goal Achievement: 04/07/24 Potential to Achieve Goals: Good Progress towards PT goals: Progressing toward goals    Frequency    Min 3X/week      PT Plan      Co-evaluation              AM-PAC PT 6 Clicks Mobility   Outcome Measure  Help needed turning from your back to your side while in a flat bed without using bedrails?: Total Help needed moving from lying on your back to sitting on the side of a flat bed without using bedrails?: Total Help needed moving to and from a bed to a chair (including a wheelchair)?: Total Help needed standing up from a chair using your arms (e.g., wheelchair or bedside chair)?: Total Help needed to walk in hospital room?: Total Help needed climbing 3-5 steps with a railing? : Total 6 Click Score: 6    End of Session   Activity Tolerance: Patient limited by lethargy;Patient limited by fatigue;Treatment limited secondary to agitation Patient left: with call bell/phone within reach;in bed;with bed alarm set Nurse Communication: Mobility status PT Visit Diagnosis: Repeated falls (R29.6);Muscle weakness (generalized) (M62.81);Other abnormalities of gait and mobility (R26.89)     Time: 1420-1443 PT Time Calculation (min) (ACUTE ONLY): 23 min  Charges:    $Therapeutic Activity: 23-37 mins PT General Charges $$ ACUTE PT VISIT: 1 Visit                     Juni Glaab, SPT

## 2024-03-31 NOTE — Plan of Care (Signed)
  Problem: Pain Managment: Goal: General experience of comfort will improve and/or be controlled Outcome: Progressing   Problem: Safety: Goal: Ability to remain free from injury will improve Outcome: Progressing

## 2024-04-01 ENCOUNTER — Inpatient Hospital Stay (HOSPITAL_COMMUNITY)

## 2024-04-01 DIAGNOSIS — I5033 Acute on chronic diastolic (congestive) heart failure: Secondary | ICD-10-CM | POA: Diagnosis not present

## 2024-04-01 LAB — CBC
HCT: 32.1 % — ABNORMAL LOW (ref 39.0–52.0)
Hemoglobin: 10.1 g/dL — ABNORMAL LOW (ref 13.0–17.0)
MCH: 29.6 pg (ref 26.0–34.0)
MCHC: 31.5 g/dL (ref 30.0–36.0)
MCV: 94.1 fL (ref 80.0–100.0)
Platelets: 85 K/uL — ABNORMAL LOW (ref 150–400)
RBC: 3.41 MIL/uL — ABNORMAL LOW (ref 4.22–5.81)
RDW: 15.5 % (ref 11.5–15.5)
WBC: 8.3 K/uL (ref 4.0–10.5)
nRBC: 0 % (ref 0.0–0.2)

## 2024-04-01 LAB — BASIC METABOLIC PANEL WITH GFR
Anion gap: 13 (ref 5–15)
BUN: 47 mg/dL — ABNORMAL HIGH (ref 8–23)
CO2: 26 mmol/L (ref 22–32)
Calcium: 9.7 mg/dL (ref 8.9–10.3)
Chloride: 99 mmol/L (ref 98–111)
Creatinine, Ser: 1.77 mg/dL — ABNORMAL HIGH (ref 0.61–1.24)
GFR, Estimated: 36 mL/min — ABNORMAL LOW (ref >60)
Glucose, Bld: 80 mg/dL (ref 70–99)
Potassium: 3.9 mmol/L (ref 3.5–5.1)
Sodium: 139 mmol/L (ref 135–145)

## 2024-04-01 LAB — GLUCOSE, CAPILLARY
Glucose-Capillary: 70 mg/dL (ref 70–99)
Glucose-Capillary: 72 mg/dL (ref 70–99)
Glucose-Capillary: 109 mg/dL — ABNORMAL HIGH (ref 70–99)
Glucose-Capillary: 142 mg/dL — ABNORMAL HIGH (ref 70–99)

## 2024-04-01 MED ORDER — QUETIAPINE FUMARATE 25 MG PO TABS
25.0000 mg | ORAL_TABLET | Freq: Every day | ORAL | Status: DC
  Administered 2024-04-01 – 2024-04-04 (×4): 25 mg via ORAL
  Filled 2024-04-01 (×4): qty 1

## 2024-04-01 NOTE — Plan of Care (Signed)
  Problem: Health Behavior/Discharge Planning: Goal: Ability to identify and utilize available resources and services will improve Outcome: Progressing   Problem: Metabolic: Goal: Ability to maintain appropriate glucose levels will improve Outcome: Progressing   Problem: Health Behavior/Discharge Planning: Goal: Ability to identify and utilize available resources and services will improve Outcome: Progressing   Problem: Metabolic: Goal: Ability to maintain appropriate glucose levels will improve Outcome: Progressing   Problem: Education: Goal: Ability to describe self-care measures that may prevent or decrease complications (Diabetes Survival Skills Education) will improve Outcome: Not Progressing Goal: Individualized Educational Video(s) Outcome: Not Progressing   Problem: Coping: Goal: Ability to adjust to condition or change in health will improve Outcome: Not Progressing   Problem: Fluid Volume: Goal: Ability to maintain a balanced intake and output will improve Outcome: Not Progressing   Problem: Health Behavior/Discharge Planning: Goal: Ability to manage health-related needs will improve Outcome: Not Progressing   Problem: Nutritional: Goal: Maintenance of adequate nutrition will improve Outcome: Not Progressing Goal: Progress toward achieving an optimal weight will improve Outcome: Not Progressing   Problem: Skin Integrity: Goal: Risk for impaired skin integrity will decrease Outcome: Not Progressing   Problem: Tissue Perfusion: Goal: Adequacy of tissue perfusion will improve Outcome: Not Progressing

## 2024-04-01 NOTE — Progress Notes (Signed)
 Pt drowsy and alert to name, unable to give po meds this morning d/t safety reasons for pt being asleep. MD and pt son at bedside and aware. No distress noted. Call bell in reach. Bed in lowest position.

## 2024-04-01 NOTE — Plan of Care (Signed)
  Problem: Education: Goal: Ability to describe self-care measures that may prevent or decrease complications (Diabetes Survival Skills Education) will improve Outcome: Progressing Goal: Individualized Educational Video(s) Outcome: Progressing   Problem: Coping: Goal: Ability to adjust to condition or change in health will improve Outcome: Progressing   Problem: Fluid Volume: Goal: Ability to maintain a balanced intake and output will improve Outcome: Progressing   Problem: Health Behavior/Discharge Planning: Goal: Ability to identify and utilize available resources and services will improve Outcome: Progressing Goal: Ability to manage health-related needs will improve Outcome: Progressing   Problem: Metabolic: Goal: Ability to maintain appropriate glucose levels will improve Outcome: Progressing   Problem: Nutritional: Goal: Maintenance of adequate nutrition will improve Outcome: Progressing Goal: Progress toward achieving an optimal weight will improve Outcome: Progressing   Problem: Skin Integrity: Goal: Risk for impaired skin integrity will decrease Outcome: Progressing   Problem: Tissue Perfusion: Goal: Adequacy of tissue perfusion will improve Outcome: Progressing   Problem: Education: Goal: Knowledge of General Education information will improve Description: Including pain rating scale, medication(s)/side effects and non-pharmacologic comfort measures Outcome: Progressing   Problem: Health Behavior/Discharge Planning: Goal: Ability to manage health-related needs will improve Outcome: Progressing   Problem: Clinical Measurements: Goal: Ability to maintain clinical measurements within normal limits will improve Outcome: Progressing Goal: Will remain free from infection Outcome: Progressing Goal: Diagnostic test results will improve Outcome: Progressing Goal: Respiratory complications will improve Outcome: Progressing Goal: Cardiovascular complication will  be avoided Outcome: Progressing   Problem: Activity: Goal: Risk for activity intolerance will decrease Outcome: Progressing   Problem: Nutrition: Goal: Adequate nutrition will be maintained Outcome: Progressing   Problem: Coping: Goal: Level of anxiety will decrease Outcome: Progressing   Problem: Elimination: Goal: Will not experience complications related to bowel motility Outcome: Progressing Goal: Will not experience complications related to urinary retention Outcome: Progressing   Problem: Pain Managment: Goal: General experience of comfort will improve and/or be controlled Outcome: Progressing   Problem: Safety: Goal: Ability to remain free from injury will improve Outcome: Progressing   Problem: Skin Integrity: Goal: Risk for impaired skin integrity will decrease Outcome: Progressing   Problem: Education: Goal: Ability to demonstrate management of disease process will improve Outcome: Progressing Goal: Ability to verbalize understanding of medication therapies will improve Outcome: Progressing Goal: Individualized Educational Video(s) Outcome: Progressing   Problem: Activity: Goal: Capacity to carry out activities will improve Outcome: Progressing   Problem: Cardiac: Goal: Ability to achieve and maintain adequate cardiopulmonary perfusion will improve Outcome: Progressing   Problem: Education: Goal: Ability to demonstrate management of disease process will improve Outcome: Progressing Goal: Ability to verbalize understanding of medication therapies will improve Outcome: Progressing Goal: Individualized Educational Video(s) Outcome: Progressing   Problem: Activity: Goal: Capacity to carry out activities will improve Outcome: Progressing   Problem: Cardiac: Goal: Ability to achieve and maintain adequate cardiopulmonary perfusion will improve Outcome: Progressing

## 2024-04-01 NOTE — Progress Notes (Signed)
 PROGRESS NOTE    Guy Roberson  FMW:[id number] DOB: [birth year] DOA: 03/28/2024 PCP: [name], MD   Brief Narrative:   Guy Roberson is a 89 y.o. male with medical history significant for hypertension, type 2 diabetes mellitus, CAD status post CABG, complete heart block with pacemaker, CKD 3A, chronic HFpEF, and BPH who presents with shortness of breath and increased confusion.   Patient is brought in by his son who is concerned that the patient has had worsening shortness of breath over the course of a month or so.  There has not been much cough and no fever has been documented.  Patient has had increased lower extremity swelling and was unable to get his shoes on today due to this.  Shortness of breath was worse last night and did not improve with albuterol  MDI.  Family also notes that the patient seems to be hallucinating and more confused than usual.   ED Course: Upon arrival to the ED, patient is found to be afebrile and saturating mid 90s on room air with stable BP.  Labs are most notable for creatinine 1.32, normal WBC, platelets 107,000, and proBNP 3949.  CTA chest is negative for PE but notable for small right pleural effusion and chronic RML volume loss.  There are no acute findings on head CT or CT of the abdomen and pelvis.   Patient admitted for CHF exacerbation/encephalopathy   Assessment & Plan:   Principal Problem:   Acute on chronic heart failure with preserved ejection fraction (HFpEF) (HCC) Active Problems:   Type 2 diabetes mellitus with other circulatory complications (HCC)   Essential hypertension   CAD (coronary artery disease)   CKD stage 3a, GFR 45-59 ml/min (HCC)   Acute metabolic encephalopathy   Thrombocytopenia  1. Acute on chronic HFpEF  - EF was 55-60% on echo from September 2024.   -Echo/LVEF 50%.  Severely elevated RVSP.  CT shows no PE - Will continue to diurese with IV Lasix  60 twice daily, monitor weight, intake and output, daily electrolytes,  BMP - EKG with paced rhythm - Creatinine 1.6->1.9->1.77  Will hold off on diuretics today again  2.  Severe valvular disease, moderate AI, MR, moderate to severe TR, severe pulmonary hypertension. -Continue diuretics, beta-blockers.  Not a surgical candidate   2. Acute encephalopathy - Family is concerned that patient has been more disoriented lately  - No acute findings on head CT, no infectious s/s, CT chest abdomen and pelvis unremarkable to suggest infection.  UA is negative too -TSH, ammonia within normal limits, RPR, B12 pending. -Follow delirium precautions. - Will recheck UA as family is concerned about UTI because of the presentation.  UA positive for leukocytes esterase but white count 0-5.  Unlikely UTI.  3.  Hypercalcemia: Calcium  10.7.  Zometa given 10/30.SABRA  Avoiding IV fluid due to CHF.   3. Thrombocytopenia  - Platelets 107,000 on admission, down from 112k on 03/09/24 . 85 on 11/1 - LFTs are normal; WBC is normal and there is a mild normocytic anemia   - Request smear review, repeat CBC in am     4. CAD /PPM - Hx of CABG in 2011  - No anginal symptoms  - Continue Zocor , ASA, and metoprolol      5. Type II DM /hypoglycemia - A1c was 6.7% in August 2025  - Check CBGs and use low-intensity SSI for now   -IV dextrose  infusion was started 10/29 due to recurrent low blood sugars.  Will continue  it.   6. CKD 3A  - Appears close to baseline  - Renally-dose medications       DVT prophylaxis: sq heparin   Code Status: DNR/DNI Level of Care: Level of care: Telemetry Family Communication: Son, daughter at bedside  Disposition Plan:  Patient is from: home  Anticipated d/c is to: TBD Patient currently: Pending improved volume status, PT eval, disposition planning  Consults called: None  Admission status: Inpatient     Subjective: Patient seen and examined at the bedside.  He is somnolent.  Confused. Didn't sleep well. Some cough this am.  Vital signs are stable.  He is  afebrile. Objective: Vitals:   03/31/24 1740 03/31/24 2003 04/01/24 0335 04/01/24 0542  BP:  117/61 (!) 117/49   Pulse:  (!) 59 60   Resp:  16 14   Temp: 97.6 F (36.4 C) 98.2 F (36.8 C) 97.6 F (36.4 C)   TempSrc: Rectal Axillary Oral   SpO2: 96% 100% 100%   Weight:    71.1 kg  Height:        Intake/Output Summary (Last 24 hours) at 04/01/2024 0738 Last data filed at 04/01/2024 0555 Gross per 24 hour  Intake 380 ml  Output 100 ml  Net 280 ml   Filed Weights   03/30/24 0553 03/31/24 0425 04/01/24 0542  Weight: 76.8 kg 77.5 kg 71.1 kg    Examination:  General: Lethargic, arousable Chest: Diminished bilaterally, bilateral crackles CVs: S1, S2, no murmur, regular rhythm Abdomen: Soft, nontender, no organomegaly Extremities: Bilateral peripheral edema   Data Reviewed: I have personally reviewed following labs and imaging studies  CBC: Recent Labs  Lab 03/28/24 1543 03/29/24 0445 03/30/24 0440 03/31/24 0420 04/01/24 0328  WBC 5.5 5.5 6.3 7.5 8.3  NEUTROABS 3.9  --   --   --   --   HGB 11.3* 10.8* 10.8* 11.1* 10.1*  HCT 34.8* 33.5* 33.7* 35.1* 32.1*  MCV 92.3 93.3 93.1 93.1 94.1  PLT 107* 101* 101* 89* 85*   Basic Metabolic Panel: Recent Labs  Lab 03/28/24 1543 03/29/24 0445 03/30/24 0440 03/31/24 0420 04/01/24 0328  NA 135 139 140 139 139  K 4.9 4.1 3.7 3.9 3.9  CL 100 100 99 97* 99  CO2 25 27 26 29 26   GLUCOSE 87 72 72 108* 80  BUN 38* 39* 37* 45* 47*  CREATININE 1.32* 1.46* 1.59* 1.99* 1.77*  CALCIUM  10.6* 10.7* 10.9* 10.9* 9.7  MG 2.3 2.3  --   --   --    GFR: Estimated Creatinine Clearance: 25.9 mL/min (A) (by C-G formula based on SCr of 1.77 mg/dL (H)). Liver Function Tests: Recent Labs  Lab 03/28/24 1543  AST 27  ALT 20  ALKPHOS 108  BILITOT 0.4  PROT 7.0  ALBUMIN 4.5   No results for input(s): LIPASE, AMYLASE in the last 168 hours. Recent Labs  Lab 03/28/24 2045  AMMONIA 32   Coagulation Profile: Recent Labs  Lab  03/29/24 0445  INR 1.1   Cardiac Enzymes: No results for input(s): CKTOTAL, CKMB, CKMBINDEX, TROPONINI in the last 168 hours. BNP (last 3 results) Recent Labs    03/28/24 1543  PROBNP 3,949.0*   HbA1C: No results for input(s): HGBA1C in the last 72 hours. CBG: Recent Labs  Lab 03/31/24 0723 03/31/24 1108 03/31/24 1617 03/31/24 2001 04/01/24 0714  GLUCAP 120* 134* 101* 91 70   Lipid Profile: No results for input(s): CHOL, HDL, LDLCALC, TRIG, CHOLHDL, LDLDIRECT in the last 72 hours. Thyroid Function  Tests: No results for input(s): TSH, T4TOTAL, FREET4, T3FREE, THYROIDAB in the last 72 hours.  Anemia Panel: No results for input(s): VITAMINB12, FOLATE, FERRITIN, TIBC, IRON, RETICCTPCT in the last 72 hours.  Sepsis Labs: No results for input(s): PROCALCITON, LATICACIDVEN in the last 168 hours.  Recent Results (from the past 240 hours)  Resp panel by RT-PCR (RSV, Flu A&B, Covid) Anterior Nasal Swab     Status: None   Collection Time: 03/28/24  3:32 PM   Specimen: Anterior Nasal Swab  Result Value Ref Range Status   SARS Coronavirus 2 by RT PCR NEGATIVE NEGATIVE Final    Comment: (NOTE) SARS-CoV-2 target nucleic acids are NOT DETECTED.  The SARS-CoV-2 RNA is generally detectable in upper respiratory specimens during the acute phase of infection. The lowest concentration of SARS-CoV-2 viral copies this assay can detect is 138 copies/mL. A negative result does not preclude SARS-Cov-2 infection and should not be used as the sole basis for treatment or other patient management decisions. A negative result may occur with  improper specimen collection/handling, submission of specimen other than nasopharyngeal swab, presence of viral mutation(s) within the areas targeted by this assay, and inadequate number of viral copies(<138 copies/mL). A negative result must be combined with clinical observations, patient history, and  epidemiological information. The expected result is Negative.  Fact Sheet for Patients:  bloggercourse.com  Fact Sheet for Healthcare Providers:  seriousbroker.it  This test is no t yet approved or cleared by the [place]  FDA and  has been authorized for detection and/or diagnosis of SARS-CoV-2 by FDA under an Emergency Use Authorization (EUA). This EUA will remain  in effect (meaning this test can be used) for the duration of the COVID-19 declaration under Section 564(b)(1) of the Act, 21 U.S.C.section 360bbb-3(b)(1), unless the authorization is terminated  or revoked sooner.       Influenza A by PCR NEGATIVE NEGATIVE Final   Influenza B by PCR NEGATIVE NEGATIVE Final    Comment: (NOTE) The Xpert Xpress SARS-CoV-2/FLU/RSV plus assay is intended as an aid in the diagnosis of influenza from Nasopharyngeal swab specimens and should not be used as a sole basis for treatment. Nasal washings and aspirates are unacceptable for Xpert Xpress SARS-CoV-2/FLU/RSV testing.  Fact Sheet for Patients: bloggercourse.com  Fact Sheet for Healthcare Providers: seriousbroker.it  This test is not yet approved or cleared by the [place]  FDA and has been authorized for detection and/or diagnosis of SARS-CoV-2 by FDA under an Emergency Use Authorization (EUA). This EUA will remain in effect (meaning this test can be used) for the duration of the COVID-19 declaration under Section 564(b)(1) of the Act, 21 U.S.C. section 360bbb-3(b)(1), unless the authorization is terminated or revoked.     Resp Syncytial Virus by PCR NEGATIVE NEGATIVE Final    Comment: (NOTE) Fact Sheet for Patients: bloggercourse.com  Fact Sheet for Healthcare Providers: seriousbroker.it  This test is not yet approved or cleared by the [place]  FDA and has been  authorized for detection and/or diagnosis of SARS-CoV-2 by FDA under an Emergency Use Authorization (EUA). This EUA will remain in effect (meaning this test can be used) for the duration of the COVID-19 declaration under Section 564(b)(1) of the Act, 21 U.S.C. section 360bbb-3(b)(1), unless the authorization is terminated or revoked.  Performed at [facility], [address] [place], [place] [address]   MRSA Next Gen by PCR, Nasal     Status: None   Collection Time: 03/29/24 12:00 AM   Specimen: Nasal Mucosa; Nasal  Swab  Result Value Ref Range Status   MRSA by PCR Next Gen NOT DETECTED NOT DETECTED Final    Comment: (NOTE) The GeneXpert MRSA Assay (FDA approved for NASAL specimens only), is one component of a comprehensive MRSA colonization surveillance program. It is not intended to diagnose MRSA infection nor to guide or monitor treatment for MRSA infections. Test performance is not FDA approved in patients less than 33 years old. Performed at [facility], [address], [place], [place] [address]          Radiology Studies: No results found.       Scheduled Meds:  aspirin  EC  81 mg Oral Daily   Chlorhexidine Gluconate Cloth  6 each Topical Daily   heparin   5,000 Units Subcutaneous Q8H   metoprolol  tartrate  50 mg Oral BID   simvastatin   20 mg Oral Daily   sodium chloride  flush  3 mL Intravenous Q12H   tamsulosin   0.4 mg Oral BID   Continuous Infusions:          Keryn Nessler, MD [facility] 04/01/2024, 7:38 AM

## 2024-04-02 DIAGNOSIS — I5033 Acute on chronic diastolic (congestive) heart failure: Secondary | ICD-10-CM | POA: Diagnosis not present

## 2024-04-02 LAB — GLUCOSE, CAPILLARY
Glucose-Capillary: 123 mg/dL — ABNORMAL HIGH (ref 70–99)
Glucose-Capillary: 179 mg/dL — ABNORMAL HIGH (ref 70–99)
Glucose-Capillary: 191 mg/dL — ABNORMAL HIGH (ref 70–99)
Glucose-Capillary: 134 mg/dL — ABNORMAL HIGH (ref 70–99)

## 2024-04-02 NOTE — Plan of Care (Signed)
  Problem: Education: Goal: Ability to describe self-care measures that may prevent or decrease complications (Diabetes Survival Skills Education) will improve Outcome: Progressing Goal: Individualized Educational Video(s) Outcome: Progressing   Problem: Coping: Goal: Ability to adjust to condition or change in health will improve Outcome: Progressing   Problem: Fluid Volume: Goal: Ability to maintain a balanced intake and output will improve Outcome: Progressing   Problem: Health Behavior/Discharge Planning: Goal: Ability to identify and utilize available resources and services will improve Outcome: Progressing Goal: Ability to manage health-related needs will improve Outcome: Progressing   Problem: Metabolic: Goal: Ability to maintain appropriate glucose levels will improve Outcome: Progressing   Problem: Nutritional: Goal: Maintenance of adequate nutrition will improve Outcome: Progressing Goal: Progress toward achieving an optimal weight will improve Outcome: Progressing   Problem: Skin Integrity: Goal: Risk for impaired skin integrity will decrease Outcome: Progressing   Problem: Tissue Perfusion: Goal: Adequacy of tissue perfusion will improve Outcome: Progressing   Problem: Education: Goal: Knowledge of General Education information will improve Description: Including pain rating scale, medication(s)/side effects and non-pharmacologic comfort measures Outcome: Progressing   Problem: Health Behavior/Discharge Planning: Goal: Ability to manage health-related needs will improve Outcome: Progressing   Problem: Clinical Measurements: Goal: Ability to maintain clinical measurements within normal limits will improve Outcome: Progressing Goal: Will remain free from infection Outcome: Progressing Goal: Diagnostic test results will improve Outcome: Progressing Goal: Respiratory complications will improve Outcome: Progressing Goal: Cardiovascular complication will  be avoided Outcome: Progressing   Problem: Activity: Goal: Risk for activity intolerance will decrease Outcome: Progressing   Problem: Nutrition: Goal: Adequate nutrition will be maintained Outcome: Progressing   Problem: Coping: Goal: Level of anxiety will decrease Outcome: Progressing   Problem: Elimination: Goal: Will not experience complications related to bowel motility Outcome: Progressing Goal: Will not experience complications related to urinary retention Outcome: Progressing   Problem: Pain Managment: Goal: General experience of comfort will improve and/or be controlled Outcome: Progressing   Problem: Safety: Goal: Ability to remain free from injury will improve Outcome: Progressing   Problem: Skin Integrity: Goal: Risk for impaired skin integrity will decrease Outcome: Progressing   Problem: Education: Goal: Ability to demonstrate management of disease process will improve Outcome: Progressing Goal: Ability to verbalize understanding of medication therapies will improve Outcome: Progressing Goal: Individualized Educational Video(s) Outcome: Progressing   Problem: Activity: Goal: Capacity to carry out activities will improve Outcome: Progressing   Problem: Cardiac: Goal: Ability to achieve and maintain adequate cardiopulmonary perfusion will improve Outcome: Progressing   Problem: Education: Goal: Ability to demonstrate management of disease process will improve Outcome: Progressing Goal: Ability to verbalize understanding of medication therapies will improve Outcome: Progressing Goal: Individualized Educational Video(s) Outcome: Progressing   Problem: Activity: Goal: Capacity to carry out activities will improve Outcome: Progressing   Problem: Cardiac: Goal: Ability to achieve and maintain adequate cardiopulmonary perfusion will improve Outcome: Progressing

## 2024-04-02 NOTE — Plan of Care (Signed)
  Problem: Skin Integrity: Goal: Risk for impaired skin integrity will decrease Outcome: Progressing   Problem: Coping: Goal: Level of anxiety will decrease Outcome: Progressing

## 2024-04-02 NOTE — Progress Notes (Signed)
 PROGRESS NOTE    [name]  FMW:[id number] DOB: July 10, 1931 DOA: 03/28/2024 PCP: [name], MD   Brief Narrative:   Guy Roberson is a 89 y.o. male with medical history significant for hypertension, type 2 diabetes mellitus, CAD status post CABG, complete heart block with pacemaker, CKD 3A, chronic HFpEF, and BPH who presents with shortness of breath and increased confusion.   Patient is brought in by his son who is concerned that the patient has had worsening shortness of breath over the course of a month or so.  There has not been much cough and no fever has been documented.  Patient has had increased lower extremity swelling and was unable to get his shoes on today due to this.  Shortness of breath was worse last night and did not improve with albuterol  MDI.  Family also notes that the patient seems to be hallucinating and more confused than usual.   ED Course: Upon arrival to the ED, patient is found to be afebrile and saturating mid 90s on room air with stable BP.  Labs are most notable for creatinine 1.32, normal WBC, platelets 107,000, and proBNP 3949.  CTA chest is negative for PE but notable for small right pleural effusion and chronic RML volume loss.  There are no acute findings on head CT or CT of the abdomen and pelvis.   Patient admitted for CHF exacerbation/encephalopathy   Assessment & Plan:   Principal Problem:   Acute on chronic heart failure with preserved ejection fraction (HFpEF) (HCC) Active Problems:   Type 2 diabetes mellitus with other circulatory complications (HCC)   Essential hypertension   CAD (coronary artery disease)   CKD stage 3a, GFR 45-59 ml/min (HCC)   Acute metabolic encephalopathy   Thrombocytopenia  1. Acute on chronic HFpEF  - EF was 55-60% on echo from September 2024.   -Echo/LVEF 50%.  Severely elevated RVSP.  CT shows no PE - Initially diuresed with IV Lasix  60 mg twice daily, he appears euvolemic at this time and has not been  receiving diuretics for the past 2 days. - EKG with paced rhythm - Creatinine 1.6->1.9->1.77  Will hold off on diuretics today again - Monitor daily electrolytes, intake and output.  2.  Severe valvular disease, moderate AI, MR, moderate to severe TR, severe pulmonary hypertension. - Diuretics as above, beta-blockers.  Not a surgical candidate   2. Acute encephalopathy/superimposed delirium - Family is concerned that patient has been more disoriented lately  - No acute findings on head CT, no infectious s/s, CT chest abdomen and pelvis unremarkable to suggest infection.  UA is negative too -TSH, ammonia within normal limits, RPR, B12 pending. -Follow delirium precautions.  Started on Seroquel  nightly on 11/1. - UA negative for UTI.    3.  Hypercalcemia: Calcium  10.7 on presentation has normalized.  Zometa given 10/30.SABRA  Avoiding IV fluid due to CHF.   3. Thrombocytopenia  - Platelets 107,000 on admission, down from 112k on 03/09/24 . 85 on 11/1 - LFTs are normal; WBC is normal and there is a mild normocytic anemia   - Request smear review, repeat CBC in am     4. CAD /PPM - Hx of CABG in 2011  - No anginal symptoms  - Continue Zocor , ASA, and metoprolol      5. Type II DM /hypoglycemia - A1c was 6.7% in August 2025  - Received IV dextrose . Will avoid insulin  for now, closely monitor blood sugar.  6. CKD 3A  -  Appears close to baseline  - Renally-dose medications       DVT prophylaxis: sq heparin   Code Status: DNR/DNI Level of Care: Level of care: Telemetry Family Communication: No family at the bedside today. Disposition Plan:  Patient is from home, plan for SNF.  Subjective: Patient seen and examined at the bedside.  He is alert and interactive today, remains confused.  Vital signs are stable.  objective: Vitals:   04/01/24 1232 04/01/24 1945 04/02/24 0320 04/02/24 0323  BP: (!) 121/52 (!) 132/53 (!) 122/51   Pulse: 60 62 60   Resp: 14 15 14    Temp: (!) 96.9 F  (36.1 C) 97.7 F (36.5 C) 97.6 F (36.4 C)   TempSrc: Oral Oral Oral   SpO2: 99% 97% 90%   Weight:    78.8 kg  Height:        Intake/Output Summary (Last 24 hours) at 04/02/2024 1030 Last data filed at 04/02/2024 0453 Gross per 24 hour  Intake 390 ml  Output 200 ml  Net 190 ml   Filed Weights   03/31/24 0425 04/01/24 0542 04/02/24 0323  Weight: 77.5 kg 71.1 kg 78.8 kg    Examination:  General: Alert, interactive, conversant but confused  chest: Diminished bilaterally, bilateral crackles CVs: S1, S2, no murmur, regular rhythm Abdomen: Soft, nontender, no organomegaly Extremities: Bilateral peripheral edema is resolved.   Data Reviewed: I have personally reviewed following labs and imaging studies  CBC: Recent Labs  Lab 03/28/24 1543 03/29/24 0445 03/30/24 0440 03/31/24 0420 04/01/24 0328  WBC 5.5 5.5 6.3 7.5 8.3  NEUTROABS 3.9  --   --   --   --   HGB 11.3* 10.8* 10.8* 11.1* 10.1*  HCT 34.8* 33.5* 33.7* 35.1* 32.1*  MCV 92.3 93.3 93.1 93.1 94.1  PLT 107* 101* 101* 89* 85*   Basic Metabolic Panel: Recent Labs  Lab 03/28/24 1543 03/29/24 0445 03/30/24 0440 03/31/24 0420 04/01/24 0328  NA 135 139 140 139 139  K 4.9 4.1 3.7 3.9 3.9  CL 100 100 99 97* 99  CO2 25 27 26 29 26   GLUCOSE 87 72 72 108* 80  BUN 38* 39* 37* 45* 47*  CREATININE 1.32* 1.46* 1.59* 1.99* 1.77*  CALCIUM  10.6* 10.7* 10.9* 10.9* 9.7  MG 2.3 2.3  --   --   --    GFR: Estimated Creatinine Clearance: 25.9 mL/min (A) (by C-G formula based on SCr of 1.77 mg/dL (H)). Liver Function Tests: Recent Labs  Lab 03/28/24 1543  AST 27  ALT 20  ALKPHOS 108  BILITOT 0.4  PROT 7.0  ALBUMIN 4.5   No results for input(s): LIPASE, AMYLASE in the last 168 hours. Recent Labs  Lab 03/28/24 2045  AMMONIA 32   Coagulation Profile: Recent Labs  Lab 03/29/24 0445  INR 1.1   Cardiac Enzymes: No results for input(s): CKTOTAL, CKMB, CKMBINDEX, TROPONINI in the last 168 hours. BNP  (last 3 results) Recent Labs    03/28/24 1543  PROBNP 3,949.0*   HbA1C: No results for input(s): HGBA1C in the last 72 hours. CBG: Recent Labs  Lab 04/01/24 0714 04/01/24 1126 04/01/24 1632 04/01/24 1941 04/02/24 0738  GLUCAP 70 72 109* 142* 123*   Lipid Profile: No results for input(s): CHOL, HDL, LDLCALC, TRIG, CHOLHDL, LDLDIRECT in the last 72 hours. Thyroid Function Tests: No results for input(s): TSH, T4TOTAL, FREET4, T3FREE, THYROIDAB in the last 72 hours.  Anemia Panel: No results for input(s): VITAMINB12, FOLATE, FERRITIN, TIBC, IRON, RETICCTPCT in the  last 72 hours.  Sepsis Labs: No results for input(s): PROCALCITON, LATICACIDVEN in the last 168 hours.  Recent Results (from the past 240 hours)  Resp panel by RT-PCR (RSV, Flu A&B, Covid) Anterior Nasal Swab     Status: None   Collection Time: 03/28/24  3:32 PM   Specimen: Anterior Nasal Swab  Result Value Ref Range Status   SARS Coronavirus 2 by RT PCR NEGATIVE NEGATIVE Final    Comment: (NOTE) SARS-CoV-2 target nucleic acids are NOT DETECTED.  The SARS-CoV-2 RNA is generally detectable in upper respiratory specimens during the acute phase of infection. The lowest concentration of SARS-CoV-2 viral copies this assay can detect is 138 copies/mL. A negative result does not preclude SARS-Cov-2 infection and should not be used as the sole basis for treatment or other patient management decisions. A negative result may occur with  improper specimen collection/handling, submission of specimen other than nasopharyngeal swab, presence of viral mutation(s) within the areas targeted by this assay, and inadequate number of viral copies(<138 copies/mL). A negative result must be combined with clinical observations, patient history, and epidemiological information. The expected result is Negative.  Fact Sheet for Patients:  bloggercourse.com  Fact Sheet  for Healthcare Providers:  seriousbroker.it  This test is no t yet approved or cleared by the [place]  FDA and  has been authorized for detection and/or diagnosis of SARS-CoV-2 by FDA under an Emergency Use Authorization (EUA). This EUA will remain  in effect (meaning this test can be used) for the duration of the COVID-19 declaration under Section 564(b)(1) of the Act, 21 U.S.C.section 360bbb-3(b)(1), unless the authorization is terminated  or revoked sooner.       Influenza A by PCR NEGATIVE NEGATIVE Final   Influenza B by PCR NEGATIVE NEGATIVE Final    Comment: (NOTE) The Xpert Xpress SARS-CoV-2/FLU/RSV plus assay is intended as an aid in the diagnosis of influenza from Nasopharyngeal swab specimens and should not be used as a sole basis for treatment. Nasal washings and aspirates are unacceptable for Xpert Xpress SARS-CoV-2/FLU/RSV testing.  Fact Sheet for Patients: bloggercourse.com  Fact Sheet for Healthcare Providers: seriousbroker.it  This test is not yet approved or cleared by the [place]  FDA and has been authorized for detection and/or diagnosis of SARS-CoV-2 by FDA under an Emergency Use Authorization (EUA). This EUA will remain in effect (meaning this test can be used) for the duration of the COVID-19 declaration under Section 564(b)(1) of the Act, 21 U.S.C. section 360bbb-3(b)(1), unless the authorization is terminated or revoked.     Resp Syncytial Virus by PCR NEGATIVE NEGATIVE Final    Comment: (NOTE) Fact Sheet for Patients: bloggercourse.com  Fact Sheet for Healthcare Providers: seriousbroker.it  This test is not yet approved or cleared by the [place]  FDA and has been authorized for detection and/or diagnosis of SARS-CoV-2 by FDA under an Emergency Use Authorization (EUA). This EUA will remain in effect (meaning  this test can be used) for the duration of the COVID-19 declaration under Section 564(b)(1) of the Act, 21 U.S.C. section 360bbb-3(b)(1), unless the authorization is terminated or revoked.  Performed at [facility], [address], [place], [place] [address]   MRSA Next Gen by PCR, Nasal     Status: None   Collection Time: 03/29/24 12:00 AM   Specimen: Nasal Mucosa; Nasal Swab  Result Value Ref Range Status   MRSA by PCR Next Gen NOT DETECTED NOT DETECTED Final    Comment: (NOTE) The GeneXpert MRSA Assay (FDA approved  for NASAL specimens only), is one component of a comprehensive MRSA colonization surveillance program. It is not intended to diagnose MRSA infection nor to guide or monitor treatment for MRSA infections. Test performance is not FDA approved in patients less than 20 years old. Performed at [facility], [address], [place], [place] [address]          Radiology Studies: DG CHEST PORT 1 VIEW Result Date: 04/01/2024 CLINICAL DATA:  Shortness of breath and cough EXAM: PORTABLE CHEST 1 VIEW COMPARISON:  Chest radiograph dated 03/28/2024 FINDINGS: Lines/tubes: Left chest wall pacemaker leads project over the right atrium and ventricle. Lungs: Low lung volumes with bronchovascular crowding. Bibasilar patchy opacities. Pleura: Blunting of the right costophrenic angle. No pneumothorax. Left costophrenic angle is not entirely included within the field of view. Heart/mediastinum: Similar enlarged cardiomediastinal silhouette. Bones: Median sternotomy wires are nondisplaced. IMPRESSION: 1. Low lung volumes with bronchovascular crowding. Bibasilar patchy opacities, likely atelectasis. 2. Blunting of the right costophrenic angle, which may represent a small pleural effusion. Electronically Signed   By: Limin  Xu M.D.   On: 04/01/2024 12:47         Scheduled Meds:  aspirin  EC  81 mg Oral Daily   Chlorhexidine Gluconate Cloth  6 each Topical Daily   heparin   5,000 Units  Subcutaneous Q8H   metoprolol  tartrate  50 mg Oral BID   QUEtiapine   25 mg Oral QHS   simvastatin   20 mg Oral Daily   sodium chloride  flush  3 mL Intravenous Q12H   tamsulosin   0.4 mg Oral BID   Continuous Infusions:          Derryl Duval, MD [facility] 04/02/2024, 10:30 AM

## 2024-04-03 DIAGNOSIS — N1831 Chronic kidney disease, stage 3a: Secondary | ICD-10-CM | POA: Diagnosis not present

## 2024-04-03 DIAGNOSIS — I5033 Acute on chronic diastolic (congestive) heart failure: Secondary | ICD-10-CM | POA: Diagnosis not present

## 2024-04-03 DIAGNOSIS — G9341 Metabolic encephalopathy: Secondary | ICD-10-CM | POA: Diagnosis not present

## 2024-04-03 DIAGNOSIS — Z515 Encounter for palliative care: Secondary | ICD-10-CM | POA: Diagnosis not present

## 2024-04-03 LAB — CBC WITH DIFFERENTIAL/PLATELET
Abs Immature Granulocytes: 0.02 K/uL (ref 0.00–0.07)
Basophils Absolute: 0 K/uL (ref 0.0–0.1)
Basophils Relative: 0 %
Eosinophils Absolute: 0.1 K/uL (ref 0.0–0.5)
Eosinophils Relative: 1 %
HCT: 33.9 % — ABNORMAL LOW (ref 39.0–52.0)
Hemoglobin: 10.6 g/dL — ABNORMAL LOW (ref 13.0–17.0)
Immature Granulocytes: 0 %
Lymphocytes Relative: 12 %
Lymphs Abs: 1.1 K/uL (ref 0.7–4.0)
MCH: 29.6 pg (ref 26.0–34.0)
MCHC: 31.3 g/dL (ref 30.0–36.0)
MCV: 94.7 fL (ref 80.0–100.0)
Monocytes Absolute: 1 K/uL (ref 0.1–1.0)
Monocytes Relative: 11 %
Neutro Abs: 6.5 K/uL (ref 1.7–7.7)
Neutrophils Relative %: 76 %
Platelets: 85 K/uL — ABNORMAL LOW (ref 150–400)
RBC: 3.58 MIL/uL — ABNORMAL LOW (ref 4.22–5.81)
RDW: 15.2 % (ref 11.5–15.5)
WBC: 8.6 K/uL (ref 4.0–10.5)
nRBC: 0 % (ref 0.0–0.2)

## 2024-04-03 LAB — BASIC METABOLIC PANEL WITH GFR
Anion gap: 9 (ref 5–15)
BUN: 46 mg/dL — ABNORMAL HIGH (ref 8–23)
CO2: 28 mmol/L (ref 22–32)
Calcium: 9.2 mg/dL (ref 8.9–10.3)
Chloride: 102 mmol/L (ref 98–111)
Creatinine, Ser: 1.54 mg/dL — ABNORMAL HIGH (ref 0.61–1.24)
GFR, Estimated: 43 mL/min — ABNORMAL LOW (ref >60)
Glucose, Bld: 118 mg/dL — ABNORMAL HIGH (ref 70–99)
Potassium: 3.2 mmol/L — ABNORMAL LOW (ref 3.5–5.1)
Sodium: 139 mmol/L (ref 135–145)

## 2024-04-03 LAB — GLUCOSE, CAPILLARY
Glucose-Capillary: 119 mg/dL — ABNORMAL HIGH (ref 70–99)
Glucose-Capillary: 250 mg/dL — ABNORMAL HIGH (ref 70–99)
Glucose-Capillary: 130 mg/dL — ABNORMAL HIGH (ref 70–99)
Glucose-Capillary: 166 mg/dL — ABNORMAL HIGH (ref 70–99)

## 2024-04-03 LAB — PRO BRAIN NATRIURETIC PEPTIDE: Pro Brain Natriuretic Peptide: 10093 pg/mL — ABNORMAL HIGH (ref <300.0)

## 2024-04-03 MED ORDER — METOPROLOL TARTRATE 25 MG PO TABS
25.0000 mg | ORAL_TABLET | Freq: Two times a day (BID) | ORAL | Status: DC
  Administered 2024-04-03 – 2024-04-07 (×9): 25 mg via ORAL
  Filled 2024-04-03 (×9): qty 1

## 2024-04-03 MED ORDER — INSULIN ASPART 100 UNIT/ML IJ SOLN
0.0000 [IU] | Freq: Three times a day (TID) | INTRAMUSCULAR | Status: DC
  Administered 2024-04-03 – 2024-04-04 (×3): 1 [IU] via SUBCUTANEOUS
  Filled 2024-04-03 (×2): qty 1

## 2024-04-03 MED ORDER — POTASSIUM CHLORIDE 20 MEQ PO PACK
20.0000 meq | PACK | Freq: Two times a day (BID) | ORAL | Status: AC
  Administered 2024-04-03 – 2024-04-04 (×4): 20 meq via ORAL
  Filled 2024-04-03 (×4): qty 1

## 2024-04-03 MED ORDER — FUROSEMIDE 40 MG PO TABS
40.0000 mg | ORAL_TABLET | Freq: Every day | ORAL | Status: DC
  Administered 2024-04-03 – 2024-04-07 (×5): 40 mg via ORAL
  Filled 2024-04-03 (×5): qty 1

## 2024-04-03 NOTE — Progress Notes (Signed)
 Patient's Potassium was 3.2 this am,Dr Sigdel notified. Plan of care on going.

## 2024-04-03 NOTE — Progress Notes (Signed)
 Patient's Blood Capillary Glucose was 250 at lunch,Dr [employer] notified.Plan of care on going.

## 2024-04-03 NOTE — Progress Notes (Signed)
 Daily Progress Note   Patient Name: Guy Roberson       Date: 04/03/2024 DOB: [birth year]  Age: 89 y.o. MRN#: [id number] Attending Physician: Mcarthur Pick, MD Primary Care Physician: [name], MD Admit Date: 03/28/2024  Reason for Consultation/Follow-up: Establishing goals of care  Patient Profile/HPI:  89 y.o. male  with past medical history of  hypertension, type 2 diabetes mellitus, CAD status post CABG, complete heart block with pacemaker, CKD 3A, chronic HFpEF, and BPH who presents with shortness of breath and increased confusion. He was admitted on 03/28/2024 with acute on chronic HFpEF, acute encephalopathy, thrombocytopenia, CKD 3A, and others.    Palliative medicine was consulted for GOC conversations.  Subjective: Chart reviewed including labs, progress notes, imaging from this and previous encounters.  Patient has been diuresed for CHF exacerbation.  BMP shows stable Cr 1.54 today. ECHO from 10/29 reviewed- 50% EF. Mod-severe tricuspid regurgitation.  On evaluation patient is sleeping. Appears frail.  Called patient's daughter in law, Guy Roberson. Guy Roberson feels patient is improving day to day. GOC are to continue current interventions. No needs.   Review of Systems  Reason unable to perform ROS: asleep.     Physical Exam Vitals and nursing note reviewed.  Cardiovascular:     Rate and Rhythm: Normal rate.  Pulmonary:     Effort: Pulmonary effort is normal.             Vital Signs: BP (!) 122/48 (BP Location: Right Arm)   Pulse (!) 58   Temp 97.8 F (36.6 C) (Oral)   Resp 18   Ht 5' 7 (1.702 m)   Wt 74.4 kg   SpO2 92%   BMI 25.69 kg/m  SpO2: SpO2: 92 % O2 Device: O2 Device: Room Air O2 Flow Rate: O2 Flow Rate (L/min): 2 L/min  Intake/output summary:   Intake/Output Summary (Last 24 hours) at 04/03/2024 1220 Last data filed at 04/02/2024 2020 Gross per 24 hour  Intake 480 ml  Output 800 ml  Net -320 ml   LBM: Last BM Date : 04/02/24 Baseline Weight: Weight: 78.9 kg Most recent weight: Weight: 74.4 kg       Palliative Assessment/Data:      Patient Active Problem List   Diagnosis Date Noted   Acute on chronic heart failure with preserved ejection fraction (  HFpEF) (HCC) 03/28/2024   Thrombocytopenia 03/28/2024   Benign hypertension with CKD (chronic kidney disease) stage III (HCC) 02/10/2024   Chronic diastolic (congestive) heart failure (HCC) 02/10/2024   Thyroid nodule greater than or equal to 1.5 cm in diameter incidentally noted on imaging study 02/03/2024   Acute metabolic encephalopathy 01/28/2024   Urinary tract infection without hematuria 01/28/2024   CKD stage 3a, GFR 45-59 ml/min (HCC) 01/27/2024   Aorto-iliac atherosclerosis 08/18/2022   Benign hypertension with coincident congestive heart failure (HCC) 08/18/2022   Hyperlipidemia associated with type 2 diabetes mellitus (HCC) 08/18/2022   OAB (overactive bladder) 08/18/2022   Mild protein-calorie malnutrition 08/18/2022   Chronic constipation 08/18/2022   Neurocognitive deficits 08/18/2022   Acute on chronic diastolic CHF (congestive heart failure) (HCC) 08/12/2022   Fall at home, initial encounter 08/12/2022   Arm bruise, left, initial encounter 08/12/2022   AKI (acute kidney injury) 08/12/2022   GERD (gastroesophageal reflux disease) 08/12/2022   Generalized weakness 03/04/2022   COVID-19 virus infection 03/04/2022   Acute congestive heart failure (HCC) 03/04/2022   Hypokalemia 03/04/2022   Gross hematuria 07/07/2021   Benign prostatic hyperplasia with urinary retention 07/07/2021   Bladder diverticulum 07/07/2021   Neurogenic bladder 07/07/2021   Urinary retention 01/01/2012   Pacemaker-[employer] 12/30/2011   Acute respiratory failure with hypoxia (HCC)  12/29/2011   Rib fracture 12/29/2011   Hypoxemia 12/29/2011   Syncope    Complete heart block (HCC)    Mixed hyperlipidemia    CAD (coronary artery disease)    Arteriosclerotic cardiovascular disease (ASCVD)    Fasting hyperglycemia    Colonic polyp    Type 2 diabetes mellitus with other circulatory complications (HCC) 10/02/2009   HYPERLIPIDEMIA 09/30/2009   Essential hypertension 09/30/2009    Palliative Care Assessment & Plan    Assessment/Recommendations/Plan  Continue current interventions PMT will continue to follow and see if needed- please call if acute needs arise   Code Status:   Code Status: Limited: Do not attempt resuscitation (DNR) -DNR-LIMITED -Do Not Intubate/DNI    Prognosis:  Unable to determine  Discharge Planning: To Be Determined    Thank you for allowing the Palliative Medicine Team to assist in the care of this patient.  Total time:  Prolonged billing:  Time includes:   Preparing to see the patient (e.g., review of tests) Obtaining and/or reviewing separately obtained history Performing a medically necessary appropriate examination and/or evaluation Counseling and educating the patient/family/caregiver Ordering medications, tests, or procedures Referring and communicating with other health care professionals (when not reported separately) Documenting clinical information in the electronic or other health record Independently interpreting results (not reported separately) and communicating results to the patient/family/caregiver Care coordination (not reported separately) Clinical documentation  Cassondra Stain, AGNP-C Palliative Medicine   Please contact Palliative Medicine Team phone at [phone] for questions and concerns.

## 2024-04-03 NOTE — Progress Notes (Addendum)
 Nurse at bedside,patient alert and oriented to person,place,and to situation,confused to time.Blood pressure was 106/43,heart rate 58,Dr Santosh Sidgel notified. Holding  Metoprolol  this am.Plan of care on going.

## 2024-04-03 NOTE — Progress Notes (Signed)
 PROGRESS NOTE    Guy Roberson  FMW:[id number] DOB: 1931-08-24 DOA: 03/28/2024 PCP: [name], MD   Brief Narrative:   Guy Roberson is a 89 y.o. male with medical history significant for hypertension, type 2 diabetes mellitus, CAD status post CABG, complete heart block with pacemaker, CKD 3A, chronic HFpEF, and BPH who presents with shortness of breath and increased confusion.   Patient is brought in by his son who is concerned that the patient has had worsening shortness of breath over the course of a month or so.  There has not been much cough and no fever has been documented.  Patient has had increased lower extremity swelling and was unable to get his shoes on today due to this.  Shortness of breath was worse last night and did not improve with albuterol  MDI.  Family also notes that the patient seems to be hallucinating and more confused than usual.   ED Course: Upon arrival to the ED, patient is found to be afebrile and saturating mid 90s on room air with stable BP.  Labs are most notable for creatinine 1.32, normal WBC, platelets 107,000, and proBNP 3949.  CTA chest is negative for PE but notable for small right pleural effusion and chronic RML volume loss.  There are no acute findings on head CT or CT of the abdomen and pelvis.   Patient admitted for CHF exacerbation/encephalopathy   Assessment & Plan:   Principal Problem:   Acute on chronic heart failure with preserved ejection fraction (HFpEF) (HCC) Active Problems:   Type 2 diabetes mellitus with other circulatory complications (HCC)   Essential hypertension   CAD (coronary artery disease)   CKD stage 3a, GFR 45-59 ml/min (HCC)   Acute metabolic encephalopathy   Thrombocytopenia  1. Acute on chronic HFpEF  - EF was 55-60% on echo from September 2024.   -Echo/LVEF 50%.  Severely elevated RVSP.  CT shows no PE - Initially diuresed with IV Lasix  60 mg twice daily, he appears euvolemic at this time and has not been  receiving diuretics for the past 2 days. - EKG with paced rhythm - Creatinine 1.6->1.9->1.77->1.5  Will restart on Lasix  40 mg daily - Monitor daily electrolytes, intake and output. - Continue metoprolol  with holding parameter.  2.  Severe valvular disease, moderate AI, MR, moderate to severe TR, severe pulmonary hypertension. - Diuretics as above, beta-blockers.  Not a surgical candidate   2. Acute encephalopathy/superimposed delirium - Family is concerned that patient has been more disoriented lately  - No acute findings on head CT, no infectious s/s, CT chest abdomen and pelvis unremarkable to suggest infection.  UA is negative too -TSH, ammonia within normal limits, RPR, B12 pending. -Follow delirium precautions.  Started on Seroquel  nightly on 11/1. - UA negative for UTI.   - Mental status is improving.  He is more alert and interactive.  3.  Hypercalcemia: Calcium  10.7 on presentation has normalized.  Zometa given 10/30.SABRA  Avoiding IV fluid due to CHF.   3. Thrombocytopenia  - Platelets 107,000 on admission, down from 112k on 03/09/24 . 85 on 11/1 - LFTs are normal; WBC is normal and there is a mild normocytic anemia   - Request smear review, repeat CBC in am     4. CAD /PPM - Hx of CABG in 2011  - No anginal symptoms  - Continue Zocor , ASA, and metoprolol .  Hold metoprolol  if heart rate less than 50   5. Type II DM /hypoglycemia -  A1c was 6.7% in August 2025  - Received IV dextrose . Will start low-dose sliding scale insulin .  6. CKD 3A  - Appears close to baseline  - Renally-dose medications       DVT prophylaxis: sq heparin   Code Status: DNR/DNI Level of Care: Level of care: Telemetry Family Communication: Son/daughter-in-law at the bedside   disposition Plan:  Patient is from home, plan for SNF , likely stable for discharge in 24 to 48 hours  Subjective: Patient seen and examined at the bedside.  Status has improved in past 24 to 48 hours.  He is more alert and  conversant today.  He was able to eat breakfast by himself.  Some Confusion persists. objective: Vitals:   04/02/24 1929 04/03/24 0612 04/03/24 0903 04/03/24 1209  BP: (!) 112/54 (!) 130/56 (!) 106/43 (!) 122/48  Pulse: 60 (!) 58 (!) 58 (!) 58  Resp: 17 17 18    Temp: (!) 97.5 F (36.4 C) 97.8 F (36.6 C)    TempSrc: Oral Oral    SpO2: 97% 96% 92% 92%  Weight:  74.4 kg    Height:        Intake/Output Summary (Last 24 hours) at 04/03/2024 1231 Last data filed at 04/02/2024 2020 Gross per 24 hour  Intake 480 ml  Output 800 ml  Net -320 ml   Filed Weights   04/01/24 0542 04/02/24 0323 04/03/24 0612  Weight: 71.1 kg 78.8 kg 74.4 kg    Examination:  General: Alert, interactive, conversant but confused  chest: Diminished bilaterally, bilateral crackles CVs: S1, S2, no murmur, regular rhythm Abdomen: Soft, nontender, no organomegaly Extremities: Bilateral peripheral edema is resolved.   Data Reviewed: I have personally reviewed following labs and imaging studies  CBC: Recent Labs  Lab 03/28/24 1543 03/29/24 0445 03/30/24 0440 03/31/24 0420 04/01/24 0328 04/03/24 0429  WBC 5.5 5.5 6.3 7.5 8.3 8.6  NEUTROABS 3.9  --   --   --   --  6.5  HGB 11.3* 10.8* 10.8* 11.1* 10.1* 10.6*  HCT 34.8* 33.5* 33.7* 35.1* 32.1* 33.9*  MCV 92.3 93.3 93.1 93.1 94.1 94.7  PLT 107* 101* 101* 89* 85* 85*   Basic Metabolic Panel: Recent Labs  Lab 03/28/24 1543 03/29/24 0445 03/30/24 0440 03/31/24 0420 04/01/24 0328 04/03/24 0429  NA 135 139 140 139 139 139  K 4.9 4.1 3.7 3.9 3.9 3.2*  CL 100 100 99 97* 99 102  CO2 25 27 26 29 26 28   GLUCOSE 87 72 72 108* 80 118*  BUN 38* 39* 37* 45* 47* 46*  CREATININE 1.32* 1.46* 1.59* 1.99* 1.77* 1.54*  CALCIUM  10.6* 10.7* 10.9* 10.9* 9.7 9.2  MG 2.3 2.3  --   --   --   --    GFR: Estimated Creatinine Clearance: 29.8 mL/min (A) (by C-G formula based on SCr of 1.54 mg/dL (H)). Liver Function Tests: Recent Labs  Lab 03/28/24 1543  AST 27   ALT 20  ALKPHOS 108  BILITOT 0.4  PROT 7.0  ALBUMIN 4.5   No results for input(s): LIPASE, AMYLASE in the last 168 hours. Recent Labs  Lab 03/28/24 2045  AMMONIA 32   Coagulation Profile: Recent Labs  Lab 03/29/24 0445  INR 1.1   Cardiac Enzymes: No results for input(s): CKTOTAL, CKMB, CKMBINDEX, TROPONINI in the last 168 hours. BNP (last 3 results) Recent Labs    03/28/24 1543 04/03/24 0429  PROBNP 3,949.0* 10,093.0*   HbA1C: No results for input(s): HGBA1C in the last 72  hours. CBG: Recent Labs  Lab 04/02/24 1139 04/02/24 1622 04/02/24 1936 04/03/24 0718 04/03/24 1126  GLUCAP 179* 191* 134* 119* 250*   Lipid Profile: No results for input(s): CHOL, HDL, LDLCALC, TRIG, CHOLHDL, LDLDIRECT in the last 72 hours. Thyroid Function Tests: No results for input(s): TSH, T4TOTAL, FREET4, T3FREE, THYROIDAB in the last 72 hours.  Anemia Panel: No results for input(s): VITAMINB12, FOLATE, FERRITIN, TIBC, IRON, RETICCTPCT in the last 72 hours.  Sepsis Labs: No results for input(s): PROCALCITON, LATICACIDVEN in the last 168 hours.  Recent Results (from the past 240 hours)  Resp panel by RT-PCR (RSV, Flu A&B, Covid) Anterior Nasal Swab     Status: None   Collection Time: 03/28/24  3:32 PM   Specimen: Anterior Nasal Swab  Result Value Ref Range Status   SARS Coronavirus 2 by RT PCR NEGATIVE NEGATIVE Final    Comment: (NOTE) SARS-CoV-2 target nucleic acids are NOT DETECTED.  The SARS-CoV-2 RNA is generally detectable in upper respiratory specimens during the acute phase of infection. The lowest concentration of SARS-CoV-2 viral copies this assay can detect is 138 copies/mL. A negative result does not preclude SARS-Cov-2 infection and should not be used as the sole basis for treatment or other patient management decisions. A negative result may occur with  improper specimen collection/handling, submission of specimen  other than nasopharyngeal swab, presence of viral mutation(s) within the areas targeted by this assay, and inadequate number of viral copies(<138 copies/mL). A negative result must be combined with clinical observations, patient history, and epidemiological information. The expected result is Negative.  Fact Sheet for Patients:  bloggercourse.com  Fact Sheet for Healthcare Providers:  seriousbroker.it  This test is no t yet approved or cleared by the [place]  FDA and  has been authorized for detection and/or diagnosis of SARS-CoV-2 by FDA under an Emergency Use Authorization (EUA). This EUA will remain  in effect (meaning this test can be used) for the duration of the COVID-19 declaration under Section 564(b)(1) of the Act, 21 U.S.C.section 360bbb-3(b)(1), unless the authorization is terminated  or revoked sooner.       Influenza A by PCR NEGATIVE NEGATIVE Final   Influenza B by PCR NEGATIVE NEGATIVE Final    Comment: (NOTE) The Xpert Xpress SARS-CoV-2/FLU/RSV plus assay is intended as an aid in the diagnosis of influenza from Nasopharyngeal swab specimens and should not be used as a sole basis for treatment. Nasal washings and aspirates are unacceptable for Xpert Xpress SARS-CoV-2/FLU/RSV testing.  Fact Sheet for Patients: bloggercourse.com  Fact Sheet for Healthcare Providers: seriousbroker.it  This test is not yet approved or cleared by the [place]  FDA and has been authorized for detection and/or diagnosis of SARS-CoV-2 by FDA under an Emergency Use Authorization (EUA). This EUA will remain in effect (meaning this test can be used) for the duration of the COVID-19 declaration under Section 564(b)(1) of the Act, 21 U.S.C. section 360bbb-3(b)(1), unless the authorization is terminated or revoked.     Resp Syncytial Virus by PCR NEGATIVE NEGATIVE Final     Comment: (NOTE) Fact Sheet for Patients: bloggercourse.com  Fact Sheet for Healthcare Providers: seriousbroker.it  This test is not yet approved or cleared by the [place]  FDA and has been authorized for detection and/or diagnosis of SARS-CoV-2 by FDA under an Emergency Use Authorization (EUA). This EUA will remain in effect (meaning this test can be used) for the duration of the COVID-19 declaration under Section 564(b)(1) of the Act, 21 U.S.C. section 360bbb-3(b)(1), unless the  authorization is terminated or revoked.  Performed at [facility], [address], [place], [place] [address]   MRSA Next Gen by PCR, Nasal     Status: None   Collection Time: 03/29/24 12:00 AM   Specimen: Nasal Mucosa; Nasal Swab  Result Value Ref Range Status   MRSA by PCR Next Gen NOT DETECTED NOT DETECTED Final    Comment: (NOTE) The GeneXpert MRSA Assay (FDA approved for NASAL specimens only), is one component of a comprehensive MRSA colonization surveillance program. It is not intended to diagnose MRSA infection nor to guide or monitor treatment for MRSA infections. Test performance is not FDA approved in patients less than 34 years old. Performed at [facility], [address], [place], [place] [address]          Radiology Studies: No results found.        Scheduled Meds:  aspirin  EC  81 mg Oral Daily   Chlorhexidine Gluconate Cloth  6 each Topical Daily   heparin   5,000 Units Subcutaneous Q8H   insulin  aspart  0-9 Units Subcutaneous TID WC   metoprolol  tartrate  25 mg Oral BID   potassium chloride   20 mEq Oral BID   QUEtiapine   25 mg Oral QHS   simvastatin   20 mg Oral Daily   sodium chloride  flush  3 mL Intravenous Q12H   tamsulosin   0.4 mg Oral BID   Continuous Infusions:          Derryl Duval, MD [facility] 04/03/2024, 12:31 PM

## 2024-04-03 NOTE — TOC Progression Note (Signed)
 Transition of Care [facility]) - Progression Note    Patient Details  Name: Guy Roberson MRN: [id number] Date of Birth: [birth year]  Transition of Care [facility] CM/SW Contact  Mcarthur Saddie Kim, [place] Phone Number: 04/03/2024, 11:05 AM  Clinical Narrative:  SNF auth approved. SNF updated on potential d/c tomorrow. TOC will follow.      Expected Discharge Plan: Skilled Nursing Facility Barriers to Discharge: Continued Medical Work up               Expected Discharge Plan and Services In-house Referral: Clinical Social Work Discharge Planning Services: NA Post Acute Care Choice: Home Health, Resumption of Svcs/PTA Provider Living arrangements for the past 2 months: Single Family Home                 DME Arranged: N/A DME Agency: NA                   Social Drivers of Health (SDOH) Interventions SDOH Screenings   Food Insecurity: Patient Unable To Answer (03/29/2024)  Housing: Unknown (03/29/2024)  Transportation Needs: Patient Unable To Answer (03/29/2024)  Utilities: Not At Risk (03/29/2024)  Depression (PHQ2-9): Low Risk  (08/17/2022)  Social Connections: Unknown (03/29/2024)  Tobacco Use: High Risk (03/28/2024)    Readmission Risk Interventions    04/02/2024   10:07 AM 03/31/2024   10:12 AM 03/29/2024   12:32 PM  Readmission Risk Prevention Plan  Transportation Screening Complete Complete Complete  PCP or Specialist Appt within 3-5 Days   Complete  Home Care Screening  Complete   Medication Review (RN CM)  Complete   HRI or Home Care Consult Complete  Complete  Social Work Consult for Recovery Care Planning/Counseling Complete  Complete  Palliative Care Screening Not Applicable  Not Applicable  Medication Review Oceanographer) Complete  Complete

## 2024-04-03 NOTE — Plan of Care (Signed)
   Problem: Coping: Goal: Ability to adjust to condition or change in health will improve Outcome: Progressing   Problem: Skin Integrity: Goal: Risk for impaired skin integrity will decrease Outcome: Progressing

## 2024-04-03 NOTE — Plan of Care (Signed)
  Problem: Nutritional: Goal: Maintenance of adequate nutrition will improve Outcome: Progressing   Problem: Nutritional: Goal: Maintenance of adequate nutrition will improve Outcome: Progressing

## 2024-04-04 DIAGNOSIS — I5033 Acute on chronic diastolic (congestive) heart failure: Secondary | ICD-10-CM | POA: Diagnosis not present

## 2024-04-04 LAB — GLUCOSE, CAPILLARY
Glucose-Capillary: 133 mg/dL — ABNORMAL HIGH (ref 70–99)
Glucose-Capillary: 111 mg/dL — ABNORMAL HIGH (ref 70–99)
Glucose-Capillary: 146 mg/dL — ABNORMAL HIGH (ref 70–99)
Glucose-Capillary: 143 mg/dL — ABNORMAL HIGH (ref 70–99)

## 2024-04-04 NOTE — Plan of Care (Signed)
  Problem: Nutritional: Goal: Maintenance of adequate nutrition will improve Outcome: Progressing   Problem: Nutritional: Goal: Maintenance of adequate nutrition will improve Outcome: Progressing

## 2024-04-04 NOTE — Progress Notes (Addendum)
 Nurse at bedside,patient alert to person.and situation,confused to time and place this morning.Patient was very restless last night,is what  I received in report.Patient appeared to be very tired.Patient does have difficulty hearing, bilateral ears. No c/o pain or discomfort noted . Plan of care on going.

## 2024-04-04 NOTE — Progress Notes (Signed)
 PROGRESS NOTE    Guy Roberson  FMW:[id number] DOB: Oct 16, 1931 DOA: 03/28/2024 PCP: [name], MD   Brief Narrative:   Guy Roberson is a 89 y.o. male with medical history significant for hypertension, type 2 diabetes mellitus, CAD status post CABG, complete heart block with pacemaker, CKD 3A, chronic HFpEF, and BPH who presents with shortness of breath and increased confusion.   Patient is brought in by his son who is concerned that the patient has had worsening shortness of breath over the course of a month or so.  There has not been much cough and no fever has been documented.  Patient has had increased lower extremity swelling and was unable to get his shoes on today due to this.  Shortness of breath was worse last night and did not improve with albuterol  MDI.  Family also notes that the patient seems to be hallucinating and more confused than usual.   ED Course: Upon arrival to the ED, patient is found to be afebrile and saturating mid 90s on room air with stable BP.  Labs are most notable for creatinine 1.32, normal WBC, platelets 107,000, and proBNP 3949.  CTA chest is negative for PE but notable for small right pleural effusion and chronic RML volume loss.  There are no acute findings on head CT or CT of the abdomen and pelvis.   Patient admitted for CHF exacerbation/encephalopathy   Assessment & Plan:   Principal Problem:   Acute on chronic heart failure with preserved ejection fraction (HFpEF) (HCC) Active Problems:   Type 2 diabetes mellitus with other circulatory complications (HCC)   Essential hypertension   CAD (coronary artery disease)   CKD stage 3a, GFR 45-59 ml/min (HCC)   Acute metabolic encephalopathy   Thrombocytopenia  1. Acute on chronic HFpEF  - EF was 55-60% on echo from September 2024.   -Echo/LVEF 50%.  Severely elevated RVSP.  CT shows no PE - Initially diuresed with IV Lasix  60 mg twice daily, held for a few days before restarting 40 mg p.o. daily on  11/3. -Appears euvolemic. - EKG with paced rhythm - Creatinine 1.6->1.9->1.77->1.5 will continue to monitor periodically - Monitor daily electrolytes, intake and output. - Continue metoprolol  with holding parameter.  2.  Severe valvular disease, moderate AI, MR, moderate to severe TR, severe pulmonary hypertension. - Diuretics as above, beta-blockers.  Not a surgical candidate   2. Acute encephalopathy/superimposed delirium - Family is concerned that patient has been more disoriented lately  - No acute findings on head CT, no infectious s/s, CT chest abdomen and pelvis unremarkable to suggest infection.  UA is negative too -TSH, ammonia within normal limits, B12 982 ,RPR nonreactive -Follow delirium precautions.  Started on Seroquel  nightly on 11/1. - UA negative for UTI.   - Mental status is waxing and waning .  He is more alert and interactive.  3.  Hypercalcemia: Calcium  10.7 on presentation has normalized.  Zometa given 10/30.SABRA  Avoiding IV fluid due to CHF.   3. Thrombocytopenia  - Platelets 107,000 on admission, down from 112k on 03/09/24 .  Stable at 85. - LFTs are normal; WBC is normal and there is a mild normocytic anemia    4. CAD /PPM - Hx of CABG in 2011  - No anginal symptoms  - Continue Zocor , ASA, and metoprolol .  Hold metoprolol  if heart rate less than 50   5. Type II DM /hypoglycemia - A1c was 6.7% in August 2025  - Received IV  dextrose  for few days after admission due to low blood sugars. -Currently low-dose sliding scale insulin .  6. CKD 3A  - Appears close to baseline  - Renally-dose medications       DVT prophylaxis: sq heparin   Code Status: DNR/DNI Level of Care: Level of care: Telemetry Family Communication:  disposition Plan:  Patient is from home, plan for SNF , likely stable for discharge in 24 to 48 hours  Subjective: Patient seen and examined at the bedside.  Mental status waxing and waning.  Reportedly did not have good sleep, was restless at  night and this morning was confused.  Was alert and conversant but his conversation was confusional.  Vital signs have been stable.    Objective: Vitals:   04/04/24 0358 04/04/24 0425 04/04/24 0815 04/04/24 1000  BP: 137/62  (!) 152/59 (!) 146/65  Pulse: (!) 59  60 (!) 59  Resp: 20  18 18   Temp: 98.4 F (36.9 C)     TempSrc:      SpO2: 92%  99% 96%  Weight:  73.3 kg    Height:        Intake/Output Summary (Last 24 hours) at 04/04/2024 1235 Last data filed at 04/04/2024 1219 Gross per 24 hour  Intake 300 ml  Output 400 ml  Net -100 ml   Filed Weights   04/02/24 0323 04/03/24 0612 04/04/24 0425  Weight: 78.8 kg 74.4 kg 73.3 kg    Examination:  General: Alert, interactive, conversant but confused  chest: Diminished bilaterally, bilateral crackles CVs: S1, S2, no murmur, regular rhythm Abdomen: Soft, nontender, no organomegaly Extremities: Bilateral peripheral edema is resolved.   Data Reviewed: I have personally reviewed following labs and imaging studies  CBC: Recent Labs  Lab 03/28/24 1543 03/29/24 0445 03/30/24 0440 03/31/24 0420 04/01/24 0328 04/03/24 0429  WBC 5.5 5.5 6.3 7.5 8.3 8.6  NEUTROABS 3.9  --   --   --   --  6.5  HGB 11.3* 10.8* 10.8* 11.1* 10.1* 10.6*  HCT 34.8* 33.5* 33.7* 35.1* 32.1* 33.9*  MCV 92.3 93.3 93.1 93.1 94.1 94.7  PLT 107* 101* 101* 89* 85* 85*   Basic Metabolic Panel: Recent Labs  Lab 03/28/24 1543 03/29/24 0445 03/30/24 0440 03/31/24 0420 04/01/24 0328 04/03/24 0429  NA 135 139 140 139 139 139  K 4.9 4.1 3.7 3.9 3.9 3.2*  CL 100 100 99 97* 99 102  CO2 25 27 26 29 26 28   GLUCOSE 87 72 72 108* 80 118*  BUN 38* 39* 37* 45* 47* 46*  CREATININE 1.32* 1.46* 1.59* 1.99* 1.77* 1.54*  CALCIUM  10.6* 10.7* 10.9* 10.9* 9.7 9.2  MG 2.3 2.3  --   --   --   --    GFR: Estimated Creatinine Clearance: 29.8 mL/min (A) (by C-G formula based on SCr of 1.54 mg/dL (H)). Liver Function Tests: Recent Labs  Lab 03/28/24 1543  AST 27   ALT 20  ALKPHOS 108  BILITOT 0.4  PROT 7.0  ALBUMIN 4.5   No results for input(s): LIPASE, AMYLASE in the last 168 hours. Recent Labs  Lab 03/28/24 2045  AMMONIA 32   Coagulation Profile: Recent Labs  Lab 03/29/24 0445  INR 1.1   Cardiac Enzymes: No results for input(s): CKTOTAL, CKMB, CKMBINDEX, TROPONINI in the last 168 hours. BNP (last 3 results) Recent Labs    03/28/24 1543 04/03/24 0429  PROBNP 3,949.0* 10,093.0*   HbA1C: No results for input(s): HGBA1C in the last 72 hours. CBG: Recent  Labs  Lab 04/03/24 1126 04/03/24 1658 04/03/24 2007 04/04/24 0743 04/04/24 1205  GLUCAP 250* 130* 166* 133* 111*   Lipid Profile: No results for input(s): CHOL, HDL, LDLCALC, TRIG, CHOLHDL, LDLDIRECT in the last 72 hours. Thyroid Function Tests: No results for input(s): TSH, T4TOTAL, FREET4, T3FREE, THYROIDAB in the last 72 hours.  Anemia Panel: No results for input(s): VITAMINB12, FOLATE, FERRITIN, TIBC, IRON, RETICCTPCT in the last 72 hours.  Sepsis Labs: No results for input(s): PROCALCITON, LATICACIDVEN in the last 168 hours.  Recent Results (from the past 240 hours)  Resp panel by RT-PCR (RSV, Flu A&B, Covid) Anterior Nasal Swab     Status: None   Collection Time: 03/28/24  3:32 PM   Specimen: Anterior Nasal Swab  Result Value Ref Range Status   SARS Coronavirus 2 by RT PCR NEGATIVE NEGATIVE Final    Comment: (NOTE) SARS-CoV-2 target nucleic acids are NOT DETECTED.  The SARS-CoV-2 RNA is generally detectable in upper respiratory specimens during the acute phase of infection. The lowest concentration of SARS-CoV-2 viral copies this assay can detect is 138 copies/mL. A negative result does not preclude SARS-Cov-2 infection and should not be used as the sole basis for treatment or other patient management decisions. A negative result may occur with  improper specimen collection/handling, submission of specimen  other than nasopharyngeal swab, presence of viral mutation(s) within the areas targeted by this assay, and inadequate number of viral copies(<138 copies/mL). A negative result must be combined with clinical observations, patient history, and epidemiological information. The expected result is Negative.  Fact Sheet for Patients:  bloggercourse.com  Fact Sheet for Healthcare Providers:  seriousbroker.it  This test is no t yet approved or cleared by the [place]  FDA and  has been authorized for detection and/or diagnosis of SARS-CoV-2 by FDA under an Emergency Use Authorization (EUA). This EUA will remain  in effect (meaning this test can be used) for the duration of the COVID-19 declaration under Section 564(b)(1) of the Act, 21 U.S.C.section 360bbb-3(b)(1), unless the authorization is terminated  or revoked sooner.       Influenza A by PCR NEGATIVE NEGATIVE Final   Influenza B by PCR NEGATIVE NEGATIVE Final    Comment: (NOTE) The Xpert Xpress SARS-CoV-2/FLU/RSV plus assay is intended as an aid in the diagnosis of influenza from Nasopharyngeal swab specimens and should not be used as a sole basis for treatment. Nasal washings and aspirates are unacceptable for Xpert Xpress SARS-CoV-2/FLU/RSV testing.  Fact Sheet for Patients: bloggercourse.com  Fact Sheet for Healthcare Providers: seriousbroker.it  This test is not yet approved or cleared by the [place]  FDA and has been authorized for detection and/or diagnosis of SARS-CoV-2 by FDA under an Emergency Use Authorization (EUA). This EUA will remain in effect (meaning this test can be used) for the duration of the COVID-19 declaration under Section 564(b)(1) of the Act, 21 U.S.C. section 360bbb-3(b)(1), unless the authorization is terminated or revoked.     Resp Syncytial Virus by PCR NEGATIVE NEGATIVE Final     Comment: (NOTE) Fact Sheet for Patients: bloggercourse.com  Fact Sheet for Healthcare Providers: seriousbroker.it  This test is not yet approved or cleared by the [place]  FDA and has been authorized for detection and/or diagnosis of SARS-CoV-2 by FDA under an Emergency Use Authorization (EUA). This EUA will remain in effect (meaning this test can be used) for the duration of the COVID-19 declaration under Section 564(b)(1) of the Act, 21 U.S.C. section 360bbb-3(b)(1), unless the authorization is terminated  or revoked.  Performed at [facility], [address], [place], [place] [address]   MRSA Next Gen by PCR, Nasal     Status: None   Collection Time: 03/29/24 12:00 AM   Specimen: Nasal Mucosa; Nasal Swab  Result Value Ref Range Status   MRSA by PCR Next Gen NOT DETECTED NOT DETECTED Final    Comment: (NOTE) The GeneXpert MRSA Assay (FDA approved for NASAL specimens only), is one component of a comprehensive MRSA colonization surveillance program. It is not intended to diagnose MRSA infection nor to guide or monitor treatment for MRSA infections. Test performance is not FDA approved in patients less than 70 years old. Performed at [facility], [address], [place], [place] [address]          Radiology Studies: No results found.        Scheduled Meds:  aspirin  EC  81 mg Oral Daily   furosemide   40 mg Oral Daily   heparin   5,000 Units Subcutaneous Q8H   insulin  aspart  0-9 Units Subcutaneous TID WC   metoprolol  tartrate  25 mg Oral BID   potassium chloride   20 mEq Oral BID   QUEtiapine   25 mg Oral QHS   simvastatin   20 mg Oral Daily   sodium chloride  flush  3 mL Intravenous Q12H   tamsulosin   0.4 mg Oral BID   Continuous Infusions:          Kaileen Bronkema, MD [facility] 04/04/2024, 12:35 PM

## 2024-04-04 NOTE — Progress Notes (Signed)
 Patient more confused this morning, not following simple instructions,holding morning medications until patient is fully alert and awake,and can follow instructions,Dr Sigdel notified. Plan of care on going.

## 2024-04-04 NOTE — Progress Notes (Signed)
 Physical Therapy Treatment Patient Details Name: Guy Roberson MRN: [id number] DOB: Nov 03, 1931 Today's Date: 04/04/2024   History of Present Illness Guy Roberson is a 89 y.o. male with medical history significant for hypertension, type 2 diabetes mellitus, CAD status post CABG, complete heart block with pacemaker, CKD 3A, chronic HFpEF, and BPH who presents with shortness of breath and increased confusion.     Patient is brought in by his son who is concerned that the patient has had worsening shortness of breath over the course of a month or so.  There has not been much cough and no fever has been documented.  Patient has had increased lower extremity swelling and was unable to get his shoes on today due to this.  Shortness of breath was worse last night and did not improve with albuterol  MDI.  Family also notes that the patient seems to be hallucinating and more confused than usual.    PT Comments  Pt. Was slightly agitated with treatment, pt presents with general weakness. Pt was able to perform bed mobility w/ Max A, and transfer from sit to stand with w/ Rw and Max A. Pt was non-ambulatory due fatigue, and weakness. We left pt. In bed. Nursing staff was notified on pt. Status. Patient will benefit from continued skilled physical therapy in hospital and recommended venue below to continue to increase strength, balance, endurance for safe ADLs and gait.    If plan is discharge home, recommend the following: Assistance with cooking/housework;Assist for transportation;Assistance with feeding;Help with stairs or ramp for entrance;Two people to help with walking and/or transfers;Two people to help with bathing/dressing/bathroom   Can travel by private vehicle     No  Equipment Recommendations  None recommended by PT    Recommendations for Other Services       Precautions / Restrictions Precautions Precautions: Fall Recall of Precautions/Restrictions: Intact Restrictions Weight Bearing  Restrictions Per Provider Order: No     Mobility  Bed Mobility Overal bed mobility: Needs Assistance Bed Mobility: Supine to Sit, Rolling Rolling: Max assist   Supine to sit: HOB elevated, Max assist     General bed mobility comments: Pt. was able to get to EOB of w/ Max A    Transfers Overall transfer level: Needs assistance Equipment used: Rolling walker (2 wheels) Transfers: Sit to/from Stand, Bed to chair/wheelchair/BSC Sit to Stand: Max assist, Mod assist           General transfer comment: Pt. ws able to 1 x sit to stand w/ RW    Ambulation/Gait               General Gait Details: non-ambulatory due to general weakness   Stairs             Wheelchair Mobility     Tilt Bed    Modified Rankin (Stroke Patients Only)       Balance Overall balance assessment: Needs assistance Sitting-balance support: Feet supported, Bilateral upper extremity supported Sitting balance-Leahy Scale: Zero Sitting balance - Comments: Zero Pt. could maintain balance w/o Max A/ Total assist Postural control: Left lateral lean Standing balance support: During functional activity, Reliant on assistive device for balance Standing balance-Leahy Scale: Poor Standing balance comment: Poor, Pt needed RW                            Communication Communication Communication: Impaired Factors Affecting Communication: Hearing impaired;Other (comment)  Cognition Arousal: Alert Behavior During  Therapy: Agitated   PT - Cognitive impairments: Awareness, Memory                       PT - Cognition Comments: Pt was more verbal, but still agitated with gestures and still had diffculty with understanding commands Following commands: Impaired Following commands impaired: Follows one step commands with increased time    Cueing Cueing Techniques: Verbal cues, Gestural cues, Tactile cues, Visual cues  Exercises General Exercises - Lower Extremity Straight Leg  Raises: PROM, Seated, Strengthening, Both, 5 reps, Other (comment) Hip Flexion/Marching: PROM, Seated, Strengthening, Both, 5 reps    General Comments        Pertinent Vitals/Pain Pain Assessment Pain Assessment: No/denies pain    Home Living                          Prior Function            PT Goals (current goals can now be found in the care plan section) Acute Rehab PT Goals Patient Stated Goal: Pt. wants to go SNF to improve mobility and endurance PT Goal Formulation: With family Time For Goal Achievement: 04/07/24 Potential to Achieve Goals: Good Progress towards PT goals: Progressing toward goals    Frequency    Min 3X/week      PT Plan      Co-evaluation              AM-PAC PT 6 Clicks Mobility   Outcome Measure  Help needed turning from your back to your side while in a flat bed without using bedrails?: Total Help needed moving from lying on your back to sitting on the side of a flat bed without using bedrails?: Total Help needed moving to and from a bed to a chair (including a wheelchair)?: Total Help needed standing up from a chair using your arms (e.g., wheelchair or bedside chair)?: Total Help needed to walk in hospital room?: Total Help needed climbing 3-5 steps with a railing? : Total 6 Click Score: 6    End of Session   Activity Tolerance: Patient limited by lethargy;Patient limited by fatigue;Treatment limited secondary to agitation Patient left: with call bell/phone within reach;in bed;with bed alarm set Nurse Communication: Mobility status PT Visit Diagnosis: Repeated falls (R29.6);Muscle weakness (generalized) (M62.81);Other abnormalities of gait and mobility (R26.89)     Time: 1020-1047 PT Time Calculation (min) (ACUTE ONLY): 27 min  Charges:    $Therapeutic Exercise: 8-22 mins $Therapeutic Activity: 8-22 mins PT General Charges $$ ACUTE PT VISIT: 1 Visit                     Raijon Lindfors, SPT

## 2024-04-04 NOTE — TOC Progression Note (Signed)
 Transition of Care [facility]) - Progression Note    Patient Details  Name: Guy Roberson MRN: [id number] Date of Birth: 05-05-1932  Transition [facility] CM/SW Contact  Mcarthur Saddie Kim, [place] Phone Number: 04/04/2024, 10:12 AM  Clinical Narrative: Kirke at [facility] updated on possible d/c tomorrow. LCSW confirmed auth good through 11/5.       Expected Discharge Plan: Skilled Nursing Facility Barriers to Discharge: Continued Medical Work up               Expected Discharge Plan and Services In-house Referral: Clinical Social Work Discharge Planning Services: NA Post Acute Care Choice: Home Health, Resumption of Svcs/PTA Provider Living arrangements for the past 2 months: Single Family Home                 DME Arranged: N/A DME Agency: NA                   Social Drivers of Health (SDOH) Interventions SDOH Screenings   Food Insecurity: Patient Unable To Answer (03/29/2024)  Housing: Unknown (03/29/2024)  Transportation Needs: Patient Unable To Answer (03/29/2024)  Utilities: Not At Risk (03/29/2024)  Depression (PHQ2-9): Low Risk  (08/17/2022)  Social Connections: Unknown (03/29/2024)  Tobacco Use: High Risk (03/28/2024)    Readmission Risk Interventions    04/02/2024   10:07 AM 03/31/2024   10:12 AM 03/29/2024   12:32 PM  Readmission Risk Prevention Plan  Transportation Screening Complete Complete Complete  PCP or Specialist Appt within 3-5 Days   Complete  Home Care Screening  Complete   Medication Review (RN CM)  Complete   HRI or Home Care Consult Complete  Complete  Social Work Consult for Recovery Care Planning/Counseling Complete  Complete  Palliative Care Screening Not Applicable  Not Applicable  Medication Review Oceanographer) Complete  Complete

## 2024-04-05 DIAGNOSIS — I10 Essential (primary) hypertension: Secondary | ICD-10-CM | POA: Diagnosis not present

## 2024-04-05 DIAGNOSIS — I5033 Acute on chronic diastolic (congestive) heart failure: Secondary | ICD-10-CM | POA: Diagnosis not present

## 2024-04-05 DIAGNOSIS — N1831 Chronic kidney disease, stage 3a: Secondary | ICD-10-CM | POA: Diagnosis not present

## 2024-04-05 DIAGNOSIS — E1159 Type 2 diabetes mellitus with other circulatory complications: Secondary | ICD-10-CM | POA: Diagnosis not present

## 2024-04-05 LAB — CBC WITH DIFFERENTIAL/PLATELET
Abs Immature Granulocytes: 0.04 K/uL (ref 0.00–0.07)
Basophils Absolute: 0 K/uL (ref 0.0–0.1)
Basophils Relative: 0 %
Eosinophils Absolute: 0.1 K/uL (ref 0.0–0.5)
Eosinophils Relative: 1 %
HCT: 32.9 % — ABNORMAL LOW (ref 39.0–52.0)
Hemoglobin: 10.6 g/dL — ABNORMAL LOW (ref 13.0–17.0)
Immature Granulocytes: 1 %
Lymphocytes Relative: 14 %
Lymphs Abs: 1.1 K/uL (ref 0.7–4.0)
MCH: 30.3 pg (ref 26.0–34.0)
MCHC: 32.2 g/dL (ref 30.0–36.0)
MCV: 94 fL (ref 80.0–100.0)
Monocytes Absolute: 0.8 K/uL (ref 0.1–1.0)
Monocytes Relative: 11 %
Neutro Abs: 5.5 K/uL (ref 1.7–7.7)
Neutrophils Relative %: 73 %
Platelets: 118 K/uL — ABNORMAL LOW (ref 150–400)
RBC: 3.5 MIL/uL — ABNORMAL LOW (ref 4.22–5.81)
RDW: 15.4 % (ref 11.5–15.5)
WBC: 7.5 K/uL (ref 4.0–10.5)
nRBC: 0 % (ref 0.0–0.2)

## 2024-04-05 LAB — BASIC METABOLIC PANEL WITH GFR
Anion gap: 12 (ref 5–15)
BUN: 37 mg/dL — ABNORMAL HIGH (ref 8–23)
CO2: 25 mmol/L (ref 22–32)
Calcium: 9 mg/dL (ref 8.9–10.3)
Chloride: 107 mmol/L (ref 98–111)
Creatinine, Ser: 1.47 mg/dL — ABNORMAL HIGH (ref 0.61–1.24)
GFR, Estimated: 45 mL/min — ABNORMAL LOW (ref >60)
Glucose, Bld: 91 mg/dL (ref 70–99)
Potassium: 3.5 mmol/L (ref 3.5–5.1)
Sodium: 144 mmol/L (ref 135–145)

## 2024-04-05 LAB — GLUCOSE, CAPILLARY
Glucose-Capillary: 95 mg/dL (ref 70–99)
Glucose-Capillary: 93 mg/dL (ref 70–99)
Glucose-Capillary: 98 mg/dL (ref 70–99)
Glucose-Capillary: 92 mg/dL (ref 70–99)

## 2024-04-05 MED ORDER — INSULIN ASPART 100 UNIT/ML IJ SOLN
0.0000 [IU] | Freq: Three times a day (TID) | INTRAMUSCULAR | Status: DC
  Administered 2024-04-07: 1 [IU] via SUBCUTANEOUS
  Filled 2024-04-05: qty 1

## 2024-04-05 MED ORDER — PHENYLEPHRINE-MINERAL OIL-PET 0.25-14-74.9 % RE OINT
1.0000 | TOPICAL_OINTMENT | Freq: Two times a day (BID) | RECTAL | Status: DC | PRN
  Filled 2024-04-05: qty 28

## 2024-04-05 MED ORDER — POLYETHYLENE GLYCOL 3350 17 G PO PACK
17.0000 g | PACK | Freq: Every day | ORAL | Status: DC
  Administered 2024-04-05 – 2024-04-07 (×3): 17 g via ORAL
  Filled 2024-04-05 (×3): qty 1

## 2024-04-05 MED ORDER — QUETIAPINE FUMARATE 25 MG PO TABS
12.5000 mg | ORAL_TABLET | Freq: Every day | ORAL | Status: DC
  Administered 2024-04-05 – 2024-04-06 (×2): 12.5 mg via ORAL
  Filled 2024-04-05 (×2): qty 1

## 2024-04-05 NOTE — TOC Progression Note (Signed)
 Transition of Care [facility]) - Progression Note    Patient Details  Name: Guy Roberson MRN: [id number] Date of Birth: May 23, 1932  Transition of Care [facility]) CM/SW Contact  [place]arth[place] Saddie Kim, [place] Phone Number: 04/05/2024, 10:20 AM  Clinical Narrative:   Kirke at [facility] updated on possible d/c tomorrow. CMA working on standard pacific extension. TOC will follow.     Expected Discharge Plan: Skilled Nursing Facility Barriers to Discharge: Continued Medical Work up               Expected Discharge Plan and Services In-house Referral: Clinical Social Work Discharge Planning Services: NA Post Acute Care Choice: Home Health, Resumption of Svcs/PTA Provider Living arrangements for the past 2 months: Single Family Home                 DME Arranged: N/A DME Agency: NA                   Social Drivers of Health (SDOH) Interventions SDOH Screenings   Food Insecurity: Patient Unable To Answer (03/29/2024)  Housing: Unknown (03/29/2024)  Transportation Needs: Patient Unable To Answer (03/29/2024)  Utilities: Not At Risk (03/29/2024)  Depression (PHQ2-9): Low Risk  (08/17/2022)  Social Connections: Unknown (03/29/2024)  Tobacco Use: High Risk (03/28/2024)    Readmission Risk Interventions    04/02/2024   10:07 AM 03/31/2024   10:12 AM 03/29/2024   12:32 PM  Readmission Risk Prevention Plan  Transportation Screening Complete Complete Complete  PCP or Specialist Appt within 3-5 Days   Complete  Home Care Screening  Complete   Medication Review (RN CM)  Complete   HRI or Home Care Consult Complete  Complete  Social Work Consult for Recovery Care Planning/Counseling Complete  Complete  Palliative Care Screening Not Applicable  Not Applicable  Medication Review Oceanographer) Complete  Complete

## 2024-04-05 NOTE — Progress Notes (Signed)
 Heart Failure Navigator Progress Note  Attempted to call patient in room 307 @ [facility].  No answer at this time. He was admitted for CHF exacerbation/encephalopathy. Per note family was concerned that patient has been more disoriented lately.  Per SW note from 04/04/24 patient is awaiting transfer to [facility] SNF.  He also has a scheduled follow-up on 04/11/24 @ 9:00 AM for 2 week post op appointment for generator change.    Navigator available for reassessment of patient and will sign off at this time.  Charmaine Pines, RN, BSN [facility] Heart Failure Navigator Secure Chat Only

## 2024-04-05 NOTE — Plan of Care (Signed)
  Problem: Skin Integrity: Goal: Risk for impaired skin integrity will decrease Outcome: Progressing   Problem: Tissue Perfusion: Goal: Adequacy of tissue perfusion will improve Outcome: Progressing   Problem: Coping: Goal: Level of anxiety will decrease Outcome: Progressing   Problem: Elimination: Goal: Will not experience complications related to urinary retention Outcome: Progressing   Problem: Pain Managment: Goal: General experience of comfort will improve and/or be controlled Outcome: Progressing   Problem: Cardiac: Goal: Ability to achieve and maintain adequate cardiopulmonary perfusion will improve Outcome: Progressing

## 2024-04-05 NOTE — Progress Notes (Addendum)
 Pt was able to tolerate meds crushed w/applesauce. However, pt experienced a restless night, and did not sleep. This pt continued to ramble incoherently throughout the night, attempting several times to get out of bed. Seroquel  was given with scheduled meds but appeared to be non-effective.   After early morning vitals, pt has finally fallen asleep, rise and fall of chest visualized.   Changed pt this morning, bm was two dark red blood clots, not formed stool. Pt did not complain of any pain. Informed oncoming day shift nurse and attending doctor, Eric Nunnery, MD.

## 2024-04-05 NOTE — Progress Notes (Signed)
 PROGRESS NOTE    Guy Roberson  FMW:[id number] DOB: Dec 19, 1931 DOA: 03/28/2024 PCP: [name], MD   Brief Narrative:   Guy Roberson is a 89 y.o. male with medical history significant for hypertension, type 2 diabetes mellitus, CAD status post CABG, complete heart block with pacemaker, CKD 3A, chronic HFpEF, and BPH who presents with shortness of breath and increased confusion.   Patient is brought in by his son who is concerned that the patient has had worsening shortness of breath over the course of a month or so.  There has not been much cough and no fever has been documented.  Patient has had increased lower extremity swelling and was unable to get his shoes on today due to this.  Shortness of breath was worse last night and did not improve with albuterol  MDI.  Family also notes that the patient seems to be hallucinating and more confused than usual.   ED Course: Upon arrival to the ED, patient is found to be afebrile and saturating mid 90s on room air with stable BP.  Labs are most notable for creatinine 1.32, normal WBC, platelets 107,000, and proBNP 3949.  CTA chest is negative for PE but notable for small right pleural effusion and chronic RML volume loss.  There are no acute findings on head CT or CT of the abdomen and pelvis.   Patient admitted for CHF exacerbation/encephalopathy   Assessment & Plan:   Principal Problem:   Acute on chronic heart failure with preserved ejection fraction (HFpEF) (HCC) Active Problems:   Type 2 diabetes mellitus with other circulatory complications (HCC)   Essential hypertension   CAD (coronary artery disease)   CKD stage 3a, GFR 45-59 ml/min (HCC)   Acute metabolic encephalopathy   Thrombocytopenia  1. Acute on chronic HFpEF  - EF was 55-60% on echo from September 2024.   -Echo/LVEF 50%.  Severely elevated RVSP.  CT shows no PE - Initially diuresed with IV Lasix  60 mg twice daily, held for a few days before restarting 40 mg p.o. daily on  11/3. -Appears very close to euvolemic state. - EKG with paced rhythm - Creatinine 1.6->1.9->1.77->1.5 >> 1.47will continue to monitor periodically - Monitor daily electrolytes, intake and output. - Continue metoprolol  with holding parameters.  2.  Severe valvular disease, moderate AI, MR, moderate to severe TR, severe pulmonary hypertension. - Diuretics as above, beta-blockers.  Not a surgical candidate. - Continue conservative medical management.   2. Acute encephalopathy/superimposed delirium - Family is concerned that patient has been more disoriented lately  - No acute findings on head CT, no infectious s/s, CT chest abdomen and pelvis unremarkable to suggest infection.  UA is negative too -TSH, ammonia within normal limits, B12 982 ,RPR nonreactive -Follow delirium precautions.   - UA negative for UTI.   - Mental status is waxing and waning .   - Given increased somnolence Seroquel  dosage has been cut in half - Continue supportive care and concern reorientation.  3.  Hypercalcemia: Calcium  10.7 on presentation has normalized.   -Zometa given 10/30..   - Continue to follow calcium  level - Patient advised to maintain adequate oral hydration.   3. Thrombocytopenia  - Platelets 107,000 on admission, down from 112k on 03/09/24 .   - LFTs are normal; WBC is normal and there is a mild normocytic anemia   - Holding heparin  at the moment in the setting of rectal bleeding.  4. CAD /PPM - Hx of CABG in 2011  -  No anginal symptoms  - Continue Zocor , ASA, and metoprolol .   - Continue to hold metoprolol  if heart rate less than 50. - Continue telemetry monitoring.   5. Type II DM /hypoglycemia - A1c was 6.7% in August 2025  - Received IV dextrose  for few days after admission due to low blood sugars. -Currently low-dose sliding scale insulin . - Continue to follow CBG fluctuation.  6.  Rectal bleeding  - Appears to be associated with internal hemorrhoids - Patient reports no pain -  Hemoglobin overall stable - Hemorrhoidal cream has been ordered - SCDs for DVT prophylaxis-heparin  will be placed on hold. - Follow clinical response.  7.  CKD 3A  - Appears close to baseline and improving with diuresis - Renally-dose medications   -Continue to follow renal function trend.     DVT prophylaxis: sq heparin   Code Status: DNR/DNI Level of Care: Level of care: Telemetry Family Communication:  disposition Plan:  Patient is from home, plan for SNF , likely stable for discharge in 24 to 48 hours  Subjective: Somnolent; oriented x 1 intermittently; no nausea, no vomiting.  Positive rectal bleeding appreciated.  Objective: Vitals:   04/04/24 2235 04/05/24 0424 04/05/24 0438 04/05/24 1357  BP: (!) 150/71 (!) 125/108  (!) 124/43  Pulse: 74 65  60  Resp:  18  17  Temp:  98.4 F (36.9 C)  98 F (36.7 C)  TempSrc:  Oral  Axillary  SpO2:  96%  100%  Weight:   72.5 kg   Height:        Intake/Output Summary (Last 24 hours) at 04/05/2024 1718 Last data filed at 04/05/2024 1300 Gross per 24 hour  Intake 3 ml  Output 1000 ml  Net -997 ml   Filed Weights   04/03/24 0612 04/04/24 0425 04/05/24 0438  Weight: 74.4 kg 73.3 kg 72.5 kg    Examination: General exam: Somnolent and oriented x 1 intermittently; was able to follow very simple commands.  No chest pain, no nausea, no vomiting.  Positive rectal bleeding appreciated. Respiratory system: Improved air movement bilaterally; 2 L nasal cannula supplementation in place with good saturation.  Decreased breath sounds at the bases.  No using accessory muscles. Cardiovascular system: Rate controlled, no rubs, no gallops, positive murmur appreciated on exam.  No JVD on exam. Gastrointestinal system: Abdomen is nondistended, soft and nontender. No organomegaly or masses felt. Normal bowel sounds heard. Central nervous system: Generally weak.  Moving 4 limbs spontaneously.  No focal neurological deficits. Extremities: No cyanosis  or clubbing; trace edema appreciated bilaterally. Skin: No petechiae. Psychiatry: Judgement and insight appear impaired secondary to underlying dementia; flat affect.   Data Reviewed: I have personally reviewed following labs and imaging studies  CBC: Recent Labs  Lab 03/30/24 0440 03/31/24 0420 04/01/24 0328 04/03/24 0429 04/05/24 0505  WBC 6.3 7.5 8.3 8.6 7.5  NEUTROABS  --   --   --  6.5 5.5  HGB 10.8* 11.1* 10.1* 10.6* 10.6*  HCT 33.7* 35.1* 32.1* 33.9* 32.9*  MCV 93.1 93.1 94.1 94.7 94.0  PLT 101* 89* 85* 85* 118*   Basic Metabolic Panel: Recent Labs  Lab 03/30/24 0440 03/31/24 0420 04/01/24 0328 04/03/24 0429 04/05/24 0505  NA 140 139 139 139 144  K 3.7 3.9 3.9 3.2* 3.5  CL 99 97* 99 102 107  CO2 26 29 26 28 25   GLUCOSE 72 108* 80 118* 91  BUN 37* 45* 47* 46* 37*  CREATININE 1.59* 1.99* 1.77* 1.54* 1.47*  CALCIUM  10.9* 10.9* 9.7 9.2 9.0   GFR: Estimated Creatinine Clearance: 31.2 mL/min (A) (by C-G formula based on SCr of 1.47 mg/dL (H)).  BNP (last 3 results) Recent Labs    03/28/24 1543 04/03/24 0429  PROBNP 3,949.0* 10,093.0*   CBG: Recent Labs  Lab 04/04/24 1714 04/04/24 1932 04/05/24 0744 04/05/24 1117 04/05/24 1652  GLUCAP 146* 143* 95 93 98   Sepsis Labs:  Recent Results (from the past 240 hours)  Resp panel by RT-PCR (RSV, Flu A&B, Covid) Anterior Nasal Swab     Status: None   Collection Time: 03/28/24  3:32 PM   Specimen: Anterior Nasal Swab  Result Value Ref Range Status   SARS Coronavirus 2 by RT PCR NEGATIVE NEGATIVE Final    Comment: (NOTE) SARS-CoV-2 target nucleic acids are NOT DETECTED.  The SARS-CoV-2 RNA is generally detectable in upper respiratory specimens during the acute phase of infection. The lowest concentration of SARS-CoV-2 viral copies this assay can detect is 138 copies/mL. A negative result does not preclude SARS-Cov-2 infection and should not be used as the sole basis for treatment or other patient  management decisions. A negative result may occur with  improper specimen collection/handling, submission of specimen other than nasopharyngeal swab, presence of viral mutation(s) within the areas targeted by this assay, and inadequate number of viral copies(<138 copies/mL). A negative result must be combined with clinical observations, patient history, and epidemiological information. The expected result is Negative.  Fact Sheet for Patients:  bloggercourse.com  Fact Sheet for Healthcare Providers:  seriousbroker.it  This test is no t yet approved or cleared by the [place]  FDA and  has been authorized for detection and/or diagnosis of SARS-CoV-2 by FDA under an Emergency Use Authorization (EUA). This EUA will remain  in effect (meaning this test can be used) for the duration of the COVID-19 declaration under Section 564(b)(1) of the Act, 21 U.S.C.section 360bbb-3(b)(1), unless the authorization is terminated  or revoked sooner.       Influenza A by PCR NEGATIVE NEGATIVE Final   Influenza B by PCR NEGATIVE NEGATIVE Final    Comment: (NOTE) The Xpert Xpress SARS-CoV-2/FLU/RSV plus assay is intended as an aid in the diagnosis of influenza from Nasopharyngeal swab specimens and should not be used as a sole basis for treatment. Nasal washings and aspirates are unacceptable for Xpert Xpress SARS-CoV-2/FLU/RSV testing.  Fact Sheet for Patients: bloggercourse.com  Fact Sheet for Healthcare Providers: seriousbroker.it  This test is not yet approved or cleared by the [place]  FDA and has been authorized for detection and/or diagnosis of SARS-CoV-2 by FDA under an Emergency Use Authorization (EUA). This EUA will remain in effect (meaning this test can be used) for the duration of the COVID-19 declaration under Section 564(b)(1) of the Act, 21 U.S.C. section 360bbb-3(b)(1),  unless the authorization is terminated or revoked.     Resp Syncytial Virus by PCR NEGATIVE NEGATIVE Final    Comment: (NOTE) Fact Sheet for Patients: bloggercourse.com  Fact Sheet for Healthcare Providers: seriousbroker.it  This test is not yet approved or cleared by the [place]  FDA and has been authorized for detection and/or diagnosis of SARS-CoV-2 by FDA under an Emergency Use Authorization (EUA). This EUA will remain in effect (meaning this test can be used) for the duration of the COVID-19 declaration under Section 564(b)(1) of the Act, 21 U.S.C. section 360bbb-3(b)(1), unless the authorization is terminated or revoked.  Performed at [facility], [address], [place], [place] [address]   MRSA  Next Gen by PCR, Nasal     Status: None   Collection Time: 03/29/24 12:00 AM   Specimen: Nasal Mucosa; Nasal Swab  Result Value Ref Range Status   MRSA by PCR Next Gen NOT DETECTED NOT DETECTED Final    Comment: (NOTE) The GeneXpert MRSA Assay (FDA approved for NASAL specimens only), is one component of a comprehensive MRSA colonization surveillance program. It is not intended to diagnose MRSA infection nor to guide or monitor treatment for MRSA infections. Test performance is not FDA approved in patients less than 72 years old. Performed at [facility], [address], [place], [place] [address]     Scheduled Meds:  aspirin  EC  81 mg Oral Daily   furosemide   40 mg Oral Daily   insulin  aspart  0-9 Units Subcutaneous TID WC   metoprolol  tartrate  25 mg Oral BID   polyethylene glycol  17 g Oral Daily   QUEtiapine   12.5 mg Oral QHS   simvastatin   20 mg Oral Daily   sodium chloride  flush  3 mL Intravenous Q12H   tamsulosin   0.4 mg Oral BID    Eric Nunnery, MD [facility] 04/05/2024, 5:18 PM

## 2024-04-05 NOTE — Plan of Care (Signed)
  Problem: Education: Goal: Ability to describe self-care measures that may prevent or decrease complications (Diabetes Survival Skills Education) will improve Outcome: Progressing Goal: Individualized Educational Video(s) Outcome: Progressing   Problem: Coping: Goal: Ability to adjust to condition or change in health will improve Outcome: Progressing   Problem: Fluid Volume: Goal: Ability to maintain a balanced intake and output will improve Outcome: Progressing   Problem: Health Behavior/Discharge Planning: Goal: Ability to identify and utilize available resources and services will improve Outcome: Progressing Goal: Ability to manage health-related needs will improve Outcome: Progressing   Problem: Metabolic: Goal: Ability to maintain appropriate glucose levels will improve Outcome: Progressing   Problem: Nutritional: Goal: Maintenance of adequate nutrition will improve Outcome: Progressing Goal: Progress toward achieving an optimal weight will improve Outcome: Progressing   Problem: Skin Integrity: Goal: Risk for impaired skin integrity will decrease Outcome: Progressing   Problem: Tissue Perfusion: Goal: Adequacy of tissue perfusion will improve Outcome: Progressing   Problem: Education: Goal: Knowledge of General Education information will improve Description: Including pain rating scale, medication(s)/side effects and non-pharmacologic comfort measures Outcome: Progressing   Problem: Health Behavior/Discharge Planning: Goal: Ability to manage health-related needs will improve Outcome: Progressing   Problem: Clinical Measurements: Goal: Ability to maintain clinical measurements within normal limits will improve Outcome: Progressing Goal: Will remain free from infection Outcome: Progressing Goal: Diagnostic test results will improve Outcome: Progressing Goal: Respiratory complications will improve Outcome: Progressing Goal: Cardiovascular complication will  be avoided Outcome: Progressing   Problem: Activity: Goal: Risk for activity intolerance will decrease Outcome: Progressing   Problem: Nutrition: Goal: Adequate nutrition will be maintained Outcome: Progressing   Problem: Coping: Goal: Level of anxiety will decrease Outcome: Progressing   Problem: Elimination: Goal: Will not experience complications related to bowel motility Outcome: Progressing Goal: Will not experience complications related to urinary retention Outcome: Progressing   Problem: Pain Managment: Goal: General experience of comfort will improve and/or be controlled Outcome: Progressing   Problem: Safety: Goal: Ability to remain free from injury will improve Outcome: Progressing   Problem: Skin Integrity: Goal: Risk for impaired skin integrity will decrease Outcome: Progressing   Problem: Education: Goal: Ability to demonstrate management of disease process will improve Outcome: Progressing Goal: Ability to verbalize understanding of medication therapies will improve Outcome: Progressing Goal: Individualized Educational Video(s) Outcome: Progressing   Problem: Activity: Goal: Capacity to carry out activities will improve Outcome: Progressing   Problem: Cardiac: Goal: Ability to achieve and maintain adequate cardiopulmonary perfusion will improve Outcome: Progressing   Problem: Education: Goal: Ability to demonstrate management of disease process will improve Outcome: Progressing Goal: Ability to verbalize understanding of medication therapies will improve Outcome: Progressing Goal: Individualized Educational Video(s) Outcome: Progressing   Problem: Activity: Goal: Capacity to carry out activities will improve Outcome: Progressing   Problem: Cardiac: Goal: Ability to achieve and maintain adequate cardiopulmonary perfusion will improve Outcome: Progressing

## 2024-04-06 DIAGNOSIS — N1831 Chronic kidney disease, stage 3a: Secondary | ICD-10-CM | POA: Diagnosis not present

## 2024-04-06 DIAGNOSIS — E1159 Type 2 diabetes mellitus with other circulatory complications: Secondary | ICD-10-CM | POA: Diagnosis not present

## 2024-04-06 DIAGNOSIS — I5033 Acute on chronic diastolic (congestive) heart failure: Secondary | ICD-10-CM | POA: Diagnosis not present

## 2024-04-06 DIAGNOSIS — I10 Essential (primary) hypertension: Secondary | ICD-10-CM | POA: Diagnosis not present

## 2024-04-06 LAB — CBC
HCT: 32.4 % — ABNORMAL LOW (ref 39.0–52.0)
Hemoglobin: 9.9 g/dL — ABNORMAL LOW (ref 13.0–17.0)
MCH: 29.2 pg (ref 26.0–34.0)
MCHC: 30.6 g/dL (ref 30.0–36.0)
MCV: 95.6 fL (ref 80.0–100.0)
Platelets: 137 K/uL — ABNORMAL LOW (ref 150–400)
RBC: 3.39 MIL/uL — ABNORMAL LOW (ref 4.22–5.81)
RDW: 15.3 % (ref 11.5–15.5)
WBC: 6.6 K/uL (ref 4.0–10.5)
nRBC: 0 % (ref 0.0–0.2)

## 2024-04-06 LAB — BASIC METABOLIC PANEL WITH GFR
Anion gap: 12 (ref 5–15)
BUN: 34 mg/dL — ABNORMAL HIGH (ref 8–23)
CO2: 24 mmol/L (ref 22–32)
Calcium: 8.9 mg/dL (ref 8.9–10.3)
Chloride: 109 mmol/L (ref 98–111)
Creatinine, Ser: 1.47 mg/dL — ABNORMAL HIGH (ref 0.61–1.24)
GFR, Estimated: 45 mL/min — ABNORMAL LOW (ref >60)
Glucose, Bld: 79 mg/dL (ref 70–99)
Potassium: 3.9 mmol/L (ref 3.5–5.1)
Sodium: 145 mmol/L (ref 135–145)

## 2024-04-06 LAB — GLUCOSE, CAPILLARY
Glucose-Capillary: 84 mg/dL (ref 70–99)
Glucose-Capillary: 109 mg/dL — ABNORMAL HIGH (ref 70–99)
Glucose-Capillary: 115 mg/dL — ABNORMAL HIGH (ref 70–99)
Glucose-Capillary: 134 mg/dL — ABNORMAL HIGH (ref 70–99)

## 2024-04-06 MED ORDER — DM-GUAIFENESIN ER 30-600 MG PO TB12
1.0000 | ORAL_TABLET | Freq: Two times a day (BID) | ORAL | Status: DC
  Administered 2024-04-06 – 2024-04-07 (×2): 1 via ORAL
  Filled 2024-04-06 (×2): qty 1

## 2024-04-06 NOTE — Progress Notes (Signed)
 PROGRESS NOTE    Guy Roberson  FMW:[id number] DOB: [birth year] DOA: 03/28/2024 PCP: [name], MD   Brief Narrative:   Guy Roberson is a 89 y.o. male with medical history significant for hypertension, type 2 diabetes mellitus, CAD status post CABG, complete heart block with pacemaker, CKD 3A, chronic HFpEF, and BPH who presents with shortness of breath and increased confusion.   Patient is brought in by his son who is concerned that the patient has had worsening shortness of breath over the course of a month or so.  There has not been much cough and no fever has been documented.  Patient has had increased lower extremity swelling and was unable to get his shoes on today due to this.  Shortness of breath was worse last night and did not improve with albuterol  MDI.  Family also notes that the patient seems to be hallucinating and more confused than usual.   ED Course: Upon arrival to the ED, patient is found to be afebrile and saturating mid 90s on room air with stable BP.  Labs are most notable for creatinine 1.32, normal WBC, platelets 107,000, and proBNP 3949.  CTA chest is negative for PE but notable for small right pleural effusion and chronic RML volume loss.  There are no acute findings on head CT or CT of the abdomen and pelvis.   Patient admitted for CHF exacerbation/encephalopathy   Assessment & Plan:   Principal Problem:   Acute on chronic heart failure with preserved ejection fraction (HFpEF) (HCC) Active Problems:   Type 2 diabetes mellitus with other circulatory complications (HCC)   Essential hypertension   CAD (coronary artery disease)   CKD stage 3a, GFR 45-59 ml/min (HCC)   Acute metabolic encephalopathy   Thrombocytopenia  1. Acute on chronic HFpEF  - EF was 55-60% on echo from September 2024.   -Echo/LVEF 50%.  Severely elevated RVSP.  CT shows no PE - Initially diuresed with IV Lasix  60 mg twice daily, held for a few days before restarting 40 mg p.o. daily on  11/3. -Appears very close to euvolemic state. - EKG with paced rhythm - Creatinine 1.6->1.9->1.77->1.5 >> 1.47 will continue to monitor intermittently. - Continue to follow electrolytes trend intermittently. - Continue strict I's and O's and daily weights.  - Continue metoprolol  with holding parameters.  2.  Severe valvular disease, moderate AI, MR, moderate to severe TR, severe pulmonary hypertension. - Diuretics as above, beta-blockers.  Not a surgical candidate. - Continue conservative medical management.   2. Acute encephalopathy/superimposed delirium - Family is concerned that patient has been more disoriented lately  - No acute findings on head CT, no infectious s/s, CT chest abdomen and pelvis unremarkable to suggest infection.  UA is negative too -TSH, ammonia within normal limits, B12 982 ,RPR nonreactive -Follow delirium precautions.   - UA negative for UTI.   - Mental status is waxing and waning .   - Continue low-dose Seroquel , constant reorientation and supportive care. On today's examination he was more interactive.-  3.  Hypercalcemia: Calcium  10.7 on presentation has normalized.   -Zometa given 10/30..   - Continue to follow calcium  level - Patient advised to maintain adequate oral hydration.   3. Thrombocytopenia  - Platelets 107,000 on admission, down from 112k on 03/09/24 .   - LFTs are normal; WBC is normal and there is a mild normocytic anemia   - Holding heparin  at the moment in the setting of rectal bleeding.  4.  CAD /PPM - Hx of CABG in 2011  - No anginal symptoms  - Continue Zocor , ASA, and metoprolol .   - Continue to hold metoprolol  if heart rate less than 50. - Continue telemetry monitoring.   5. Type II DM /hypoglycemia - A1c was 6.7% in August 2025  - Received IV dextrose  for few days after admission due to low blood sugars. -Currently low-dose sliding scale insulin . - Continue to follow CBG fluctuation.  6.  Rectal bleeding  - Appears to be  associated with internal hemorrhoids - Patient reports no pain - Hemoglobin overall stable - Hemorrhoidal cream has been ordered - SCDs for DVT prophylaxis-heparin  will be placed on hold. - Follow clinical response.  7.  Acute kidney injury on CKD 3A  - Overall improved and now appears close to baseline and improving with diuresis - Continue to renally-dose medications   -Continue to follow renal function trend.     DVT prophylaxis: sq heparin   Code Status: DNR/DNI Level of Care: Level of care: Telemetry Family Communication:  disposition Plan:  Patient is from home, plan for SNF , likely stable for discharge in 24 to 48 hours  Subjective: Patient is alert, awake and oriented x 1; following commands appropriately.  More interactive on today's examination.  Afebrile and demonstrating good saturation on 2 L supplementation.  Objective: Vitals:   04/05/24 1357 04/05/24 2004 04/06/24 0315 04/06/24 1241  BP: (!) 124/43 (!) 140/54 (!) 136/45 (!) 123/48  Pulse: 60 79 62 60  Resp: 17 18 18    Temp: 98 F (36.7 C) 98 F (36.7 C) 98.4 F (36.9 C) 98.7 F (37.1 C)  TempSrc: Axillary Axillary Axillary Axillary  SpO2: 100% 93% 96% 98%  Weight:   72.5 kg   Height:        Intake/Output Summary (Last 24 hours) at 04/06/2024 1838 Last data filed at 04/06/2024 1830 Gross per 24 hour  Intake --  Output 1150 ml  Net -1150 ml   Filed Weights   04/04/24 0425 04/05/24 0438 04/06/24 0315  Weight: 73.3 kg 72.5 kg 72.5 kg    Examination: General exam: Alert, awake, oriented x 1; more interactive and able to follow simple commands.  No significant bleeding reported.   Respiratory system: Good saturation on 2 L supplementation; positive scattered rhonchi without significant crackles on exam.  Decreased breath sounds appreciated at the bases. Cardiovascular system: Positive murmur, no rubs, no gallops, no JVD.  Rate controlled. Gastrointestinal system: Abdomen is nondistended, soft and  nontender. No organomegaly or masses felt. Normal bowel sounds heard. Central nervous system: Weak and deconditioned; no focal deficit.  Limited examination secondary to underlying cognitive deficit. Extremities: No cyanosis or clubbing; no edema. Skin: No petechiae. Psychiatry: Judgement and insight appear impaired secondary to dementia.  Data Reviewed: I have personally reviewed following labs and imaging studies  CBC: Recent Labs  Lab 03/31/24 0420 04/01/24 0328 04/03/24 0429 04/05/24 0505 04/06/24 0319  WBC 7.5 8.3 8.6 7.5 6.6  NEUTROABS  --   --  6.5 5.5  --   HGB 11.1* 10.1* 10.6* 10.6* 9.9*  HCT 35.1* 32.1* 33.9* 32.9* 32.4*  MCV 93.1 94.1 94.7 94.0 95.6  PLT 89* 85* 85* 118* 137*   Basic Metabolic Panel: Recent Labs  Lab 03/31/24 0420 04/01/24 0328 04/03/24 0429 04/05/24 0505 04/06/24 0319  NA 139 139 139 144 145  K 3.9 3.9 3.2* 3.5 3.9  CL 97* 99 102 107 109  CO2 29 26 28 25 24   GLUCOSE  108* 80 118* 91 79  BUN 45* 47* 46* 37* 34*  CREATININE 1.99* 1.77* 1.54* 1.47* 1.47*  CALCIUM  10.9* 9.7 9.2 9.0 8.9   GFR: Estimated Creatinine Clearance: 31.2 mL/min (A) (by C-G formula based on SCr of 1.47 mg/dL (H)).  BNP (last 3 results) Recent Labs    03/28/24 1543 04/03/24 0429  PROBNP 3,949.0* 10,093.0*   CBG: Recent Labs  Lab 04/05/24 1652 04/05/24 2155 04/06/24 0755 04/06/24 1116 04/06/24 1620  GLUCAP 98 92 84 109* 115*   Sepsis Labs:  Recent Results (from the past 240 hours)  Resp panel by RT-PCR (RSV, Flu A&B, Covid) Anterior Nasal Swab     Status: None   Collection Time: 03/28/24  3:32 PM   Specimen: Anterior Nasal Swab  Result Value Ref Range Status   SARS Coronavirus 2 by RT PCR NEGATIVE NEGATIVE Final    Comment: (NOTE) SARS-CoV-2 target nucleic acids are NOT DETECTED.  The SARS-CoV-2 RNA is generally detectable in upper respiratory specimens during the acute phase of infection. The lowest concentration of SARS-CoV-2 viral copies this  assay can detect is 138 copies/mL. A negative result does not preclude SARS-Cov-2 infection and should not be used as the sole basis for treatment or other patient management decisions. A negative result may occur with  improper specimen collection/handling, submission of specimen other than nasopharyngeal swab, presence of viral mutation(s) within the areas targeted by this assay, and inadequate number of viral copies(<138 copies/mL). A negative result must be combined with clinical observations, patient history, and epidemiological information. The expected result is Negative.  Fact Sheet for Patients:  bloggercourse.com  Fact Sheet for Healthcare Providers:  seriousbroker.it  This test is no t yet approved or cleared by the [place]  FDA and  has been authorized for detection and/or diagnosis of SARS-CoV-2 by FDA under an Emergency Use Authorization (EUA). This EUA will remain  in effect (meaning this test can be used) for the duration of the COVID-19 declaration under Section 564(b)(1) of the Act, 21 U.S.C.section 360bbb-3(b)(1), unless the authorization is terminated  or revoked sooner.       Influenza A by PCR NEGATIVE NEGATIVE Final   Influenza B by PCR NEGATIVE NEGATIVE Final    Comment: (NOTE) The Xpert Xpress SARS-CoV-2/FLU/RSV plus assay is intended as an aid in the diagnosis of influenza from Nasopharyngeal swab specimens and should not be used as a sole basis for treatment. Nasal washings and aspirates are unacceptable for Xpert Xpress SARS-CoV-2/FLU/RSV testing.  Fact Sheet for Patients: bloggercourse.com  Fact Sheet for Healthcare Providers: seriousbroker.it  This test is not yet approved or cleared by the [place]  FDA and has been authorized for detection and/or diagnosis of SARS-CoV-2 by FDA under an Emergency Use Authorization (EUA). This EUA will  remain in effect (meaning this test can be used) for the duration of the COVID-19 declaration under Section 564(b)(1) of the Act, 21 U.S.C. section 360bbb-3(b)(1), unless the authorization is terminated or revoked.     Resp Syncytial Virus by PCR NEGATIVE NEGATIVE Final    Comment: (NOTE) Fact Sheet for Patients: bloggercourse.com  Fact Sheet for Healthcare Providers: seriousbroker.it  This test is not yet approved or cleared by the [place]  FDA and has been authorized for detection and/or diagnosis of SARS-CoV-2 by FDA under an Emergency Use Authorization (EUA). This EUA will remain in effect (meaning this test can be used) for the duration of the COVID-19 declaration under Section 564(b)(1) of the Act, 21 U.S.C. section 360bbb-3(b)(1), unless the  authorization is terminated or revoked.  Performed at [facility], [address], [place], [place] [address]   MRSA Next Gen by PCR, Nasal     Status: None   Collection Time: 03/29/24 12:00 AM   Specimen: Nasal Mucosa; Nasal Swab  Result Value Ref Range Status   MRSA by PCR Next Gen NOT DETECTED NOT DETECTED Final    Comment: (NOTE) The GeneXpert MRSA Assay (FDA approved for NASAL specimens only), is one component of a comprehensive MRSA colonization surveillance program. It is not intended to diagnose MRSA infection nor to guide or monitor treatment for MRSA infections. Test performance is not FDA approved in patients less than 17 years old. Performed at [facility], [address], [place], [place] [address]     Scheduled Meds:  aspirin  EC  81 mg Oral Daily   dextromethorphan -guaiFENesin   1 tablet Oral BID   furosemide   40 mg Oral Daily   insulin  aspart  0-9 Units Subcutaneous TID WC   metoprolol  tartrate  25 mg Oral BID   polyethylene glycol  17 g Oral Daily   QUEtiapine   12.5 mg Oral QHS   simvastatin   20 mg Oral Daily   sodium chloride  flush  3 mL Intravenous Q12H    tamsulosin   0.4 mg Oral BID    Eric Nunnery, MD [facility] 04/06/2024, 6:38 PM

## 2024-04-06 NOTE — Progress Notes (Signed)
 Daily Progress Note   Patient Name: Guy Roberson       Date: 04/06/2024 DOB: 29-Oct-1931  Age: 89 y.o. MRN#: [id number] Attending Physician: Ricky Fines, MD Primary Care Physician: [name], MD Admit Date: 03/28/2024  Reason for Consultation/Follow-up: Establishing goals of care  Patient Profile/HPI:  89 y.o. male  with past medical history of  hypertension, type 2 diabetes mellitus, CAD status post CABG, complete heart block with pacemaker, CKD 3A, chronic HFpEF, and BPH who presents with shortness of breath and increased confusion. He was admitted on 03/28/2024 with acute on chronic HFpEF, acute encephalopathy, thrombocytopenia, CKD 3A, and others.    Palliative medicine was consulted for GOC conversations.  Subjective: Chart reviewed including labs, progress notes, imaging from this and previous encounters.  CMET with Cr 1.47- stable from yesterday. CBC with Hgb down- 9.9. Noted patient has not been able to participate well with therapy due to somnolence.  Meals are documented 0-10%. Not really eating or drinking. Sleeping most of the time.  On eval he was somnolent. Did not wake to my voice.  I called and spoke to [place].  We discussed that patient has shown little improvement and perhaps some decline over the last few days. [name] has also seen this.  Discussed concept of failing to thrive.  We discussed continued medical interventions vs transition to comfort measures and supporting through natural dying process with good symptom management. Hospice philosophy of care and services were also discussed.  [name] wishes to discuss with family and with attending MD before making decision. Wishes to continue current interventions over next 24-48 hours and make transition to comfort if no  improvement.  Discussed above with attending, Dr. Ricky.   Review of Systems  Reason unable to perform ROS: asleep.     Physical Exam Vitals and nursing note reviewed.  Cardiovascular:     Rate and Rhythm: Normal rate.  Pulmonary:     Effort: Pulmonary effort is normal.             Vital Signs: BP (!) 123/48 (BP Location: Left Arm)   Pulse 60   Temp 98.7 F (37.1 C) (Axillary)   Resp 18   Ht 5' 7 (1.702 m)   Wt 72.5 kg   SpO2 98%   BMI 25.03 kg/m  SpO2: SpO2:  98 % O2 Device: O2 Device: Nasal Cannula O2 Flow Rate: O2 Flow Rate (L/min): 2 L/min  Intake/output summary:  Intake/Output Summary (Last 24 hours) at 04/06/2024 1436 Last data filed at 04/06/2024 0334 Gross per 24 hour  Intake --  Output 700 ml  Net -700 ml   LBM: Last BM Date : 04/04/24 Baseline Weight: Weight: 78.9 kg Most recent weight: Weight: 72.5 kg       Palliative Assessment/Data: PPS: 20%      Patient Active Problem List   Diagnosis Date Noted   Acute on chronic heart failure with preserved ejection fraction (HFpEF) (HCC) 03/28/2024   Thrombocytopenia 03/28/2024   Benign hypertension with CKD (chronic kidney disease) stage III (HCC) 02/10/2024   Chronic diastolic (congestive) heart failure (HCC) 02/10/2024   Thyroid nodule greater than or equal to 1.5 cm in diameter incidentally noted on imaging study 02/03/2024   Acute metabolic encephalopathy 01/28/2024   Urinary tract infection without hematuria 01/28/2024   CKD stage 3a, GFR 45-59 ml/min (HCC) 01/27/2024   Aorto-iliac atherosclerosis 08/18/2022   Benign hypertension with coincident congestive heart failure (HCC) 08/18/2022   Hyperlipidemia associated with type 2 diabetes mellitus (HCC) 08/18/2022   OAB (overactive bladder) 08/18/2022   Mild protein-calorie malnutrition 08/18/2022   Chronic constipation 08/18/2022   Neurocognitive deficits 08/18/2022   Acute on chronic diastolic CHF (congestive heart failure) (HCC) 08/12/2022    Fall at home, initial encounter 08/12/2022   Arm bruise, left, initial encounter 08/12/2022   AKI (acute kidney injury) 08/12/2022   GERD (gastroesophageal reflux disease) 08/12/2022   Generalized weakness 03/04/2022   COVID-19 virus infection 03/04/2022   Acute congestive heart failure (HCC) 03/04/2022   Hypokalemia 03/04/2022   Gross hematuria 07/07/2021   Benign prostatic hyperplasia with urinary retention 07/07/2021   Bladder diverticulum 07/07/2021   Neurogenic bladder 07/07/2021   Urinary retention 01/01/2012   Pacemaker-[employer] 12/30/2011   Acute respiratory failure with hypoxia (HCC) 12/29/2011   Rib fracture 12/29/2011   Hypoxemia 12/29/2011   Syncope    Complete heart block (HCC)    Mixed hyperlipidemia    CAD (coronary artery disease)    Arteriosclerotic cardiovascular disease (ASCVD)    Fasting hyperglycemia    Colonic polyp    Type 2 diabetes mellitus with other circulatory complications (HCC) 10/02/2009   HYPERLIPIDEMIA 09/30/2009   Essential hypertension 09/30/2009    Palliative Care Assessment & Plan    Assessment/Recommendations/Plan  Advanced heart failure, failing to thrive- discussed comfort, hospice Family would like to continue interventions for next day or two and transition to comfort if no improvement- requested attending MD to call   Code Status:   Code Status: Limited: Do not attempt resuscitation (DNR) -DNR-LIMITED -Do Not Intubate/DNI    Prognosis:  Unable to determine  Discharge Planning: To Be Determined    Thank you for allowing the Palliative Medicine Team to assist in the care of this patient.  Total time:  Prolonged billing:  Time includes:   Preparing to see the patient (e.g., review of tests) Obtaining and/or reviewing separately obtained history Performing a medically necessary appropriate examination and/or evaluation Counseling and educating the patient/family/caregiver Ordering medications, tests, or  procedures Referring and communicating with other health care professionals (when not reported separately) Documenting clinical information in the electronic or other health record Independently interpreting results (not reported separately) and communicating results to the patient/family/caregiver Care coordination (not reported separately) Clinical documentation  Cassondra Stain, AGNP-C Palliative Medicine   Please contact Palliative Medicine Team phone at  [phone] for questions and concerns.

## 2024-04-07 DIAGNOSIS — I10 Essential (primary) hypertension: Secondary | ICD-10-CM | POA: Diagnosis not present

## 2024-04-07 DIAGNOSIS — I5033 Acute on chronic diastolic (congestive) heart failure: Secondary | ICD-10-CM | POA: Diagnosis not present

## 2024-04-07 DIAGNOSIS — N179 Acute kidney failure, unspecified: Secondary | ICD-10-CM

## 2024-04-07 DIAGNOSIS — N1831 Chronic kidney disease, stage 3a: Secondary | ICD-10-CM | POA: Diagnosis not present

## 2024-04-07 DIAGNOSIS — E1159 Type 2 diabetes mellitus with other circulatory complications: Secondary | ICD-10-CM | POA: Diagnosis not present

## 2024-04-07 LAB — GLUCOSE, CAPILLARY
Glucose-Capillary: 96 mg/dL (ref 70–99)
Glucose-Capillary: 145 mg/dL — ABNORMAL HIGH (ref 70–99)

## 2024-04-07 MED ORDER — POLYETHYLENE GLYCOL 3350 17 G PO PACK: 17.0000 g | PACK | Freq: Every day | ORAL | Status: AC

## 2024-04-07 MED ORDER — SENNOSIDES-DOCUSATE SODIUM 8.6-50 MG PO TABS: 1.0000 | ORAL_TABLET | Freq: Every evening | ORAL | Status: AC | PRN

## 2024-04-07 MED ORDER — METOPROLOL TARTRATE 25 MG PO TABS: 25.0000 mg | ORAL_TABLET | Freq: Two times a day (BID) | ORAL | Status: DC

## 2024-04-07 MED ORDER — NOVOLOG FLEXPEN 100 UNIT/ML ~~LOC~~ SOPN: 3.0000 [IU] | PEN_INJECTOR | Freq: Three times a day (TID) | SUBCUTANEOUS | Status: DC

## 2024-04-07 MED ORDER — ASPIRIN 81 MG PO TBEC: 81.0000 mg | DELAYED_RELEASE_TABLET | Freq: Every day | ORAL | Status: AC

## 2024-04-07 MED ORDER — FUROSEMIDE 40 MG PO TABS: 40.0000 mg | ORAL_TABLET | Freq: Every day | ORAL | Status: DC

## 2024-04-07 MED ORDER — PHENYLEPHRINE-MINERAL OIL-PET 0.25-14-74.9 % RE OINT: 1.0000 | TOPICAL_OINTMENT | Freq: Two times a day (BID) | RECTAL | Status: AC | PRN

## 2024-04-07 MED ORDER — QUETIAPINE FUMARATE 25 MG PO TABS: 12.5000 mg | ORAL_TABLET | Freq: Every day | ORAL | Status: DC

## 2024-04-07 NOTE — TOC Transition Note (Signed)
 Transition of Care [facility]) - Discharge Note   Patient Details  Name: Guy Roberson MRN: [id number] Date of Birth: [birth year]  Transition of Care [facility]) CM/SW Contact:  Noreen KATHEE Cleotilde ISRAEL Phone Number: 04/07/2024, 3:13 PM   Clinical Narrative:     Patient is DC today to [facility].  Family already aware and have completed all necessary paperwork with [facility]. Kerri with [facility] asked if patient was DC today and it was confirmed that he would be per MD. Nurse provided with room and report number. DC summary sent via HUB. CSW signing off.   Final next level of care: Skilled Nursing Facility Barriers to Discharge: Barriers Resolved   Patient Goals and CMS Choice Patient states their goals for this hospitalization and ongoing recovery are:: short-term rehab CMS Medicare.gov Compare Post Acute Care list provided to:: Patient Represenative (must comment) Choice offered to / list presented to : Adult Children, Spouse      Discharge Placement              Patient chooses bed at: [facility] Patient to be transferred to facility by: staff via tunnel Name of family member notified: Family Patient and family notified of of transfer: 04/07/24  Discharge Plan and Services Additional resources added to the After Visit Summary for   In-house Referral: Clinical Social Work Discharge Planning Services: NA Post Acute Care Choice: Home Health, Resumption of Svcs/PTA Provider          DME Arranged: N/A DME Agency: NA                  Social Drivers of Health (SDOH) Interventions SDOH Screenings   Food Insecurity: Patient Unable To Answer (03/29/2024)  Housing: Unknown (03/29/2024)  Transportation Needs: Patient Unable To Answer (03/29/2024)  Utilities: Not At Risk (03/29/2024)  Depression (PHQ2-9): Low Risk  (08/17/2022)  Social Connections: Unknown (03/29/2024)  Tobacco Use: High Risk (03/28/2024)     Readmission Risk Interventions    04/07/2024    3:11 PM 04/02/2024   10:07  AM 03/31/2024   10:12 AM  Readmission Risk Prevention Plan  Transportation Screening Complete Complete Complete  Home Care Screening   Complete  Medication Review (RN CM)   Complete  HRI or Home Care Consult Complete Complete   Social Work Consult for Recovery Care Planning/Counseling Complete Complete   Palliative Care Screening Not Applicable Not Applicable   Medication Review Oceanographer) Complete Complete

## 2024-04-07 NOTE — Progress Notes (Signed)
 Physical Therapy Treatment Patient Details Name: Guy Roberson MRN: [id number] DOB: April 01, 1932 Today's Date: 04/07/2024   History of Present Illness Guy Roberson is a 89 y.o. male with medical history significant for hypertension, type 2 diabetes mellitus, CAD status post CABG, complete heart block with pacemaker, CKD 3A, chronic HFpEF, and BPH who presents with shortness of breath and increased confusion.     Patient is brought in by his son who is concerned that the patient has had worsening shortness of breath over the course of a month or so.  There has not been much cough and no fever has been documented.  Patient has had increased lower extremity swelling and was unable to get his shoes on today due to this.  Shortness of breath was worse last night and did not improve with albuterol  MDI.  Family also notes that the patient seems to be hallucinating and more confused than usual.    PT Comments  Patient presents more alert and cooperative for therapy. Patient demonstrates slow labored movement for sitting up at bedside with c/o discomfort when moving legs, once seated able to maintain sitting balance, very unsteady on feet using RW with frequent buckling of knees due to weakness. Patient able to transfer to chair and demonstrates fair/good carryover for completing BLE exercises with verbal and occasional visual cueing. Patient tolerated sitting up in chair after therapy - RN notified. Patient will benefit from continued skilled physical therapy in hospital and recommended venue below to increase strength, balance, endurance for safe ADLs and gait.       If plan is discharge home, recommend the following: Assist for transportation;Help with stairs or ramp for entrance;A lot of help with bathing/dressing/bathroom;Assistance with cooking/housework;A lot of help with walking and/or transfers   Can travel by private vehicle     No  Equipment Recommendations  None recommended by PT     Recommendations for Other Services       Precautions / Restrictions Precautions Precautions: Fall Recall of Precautions/Restrictions: Impaired Restrictions Weight Bearing Restrictions Per Provider Order: No     Mobility  Bed Mobility Overal bed mobility: Needs Assistance Bed Mobility: Supine to Sit Rolling: Mod assist         General bed mobility comments: slow labored movement with c/o pain/discomfort with pressure to legs    Transfers Overall transfer level: Needs assistance Equipment used: Rolling walker (2 wheels) Transfers: Sit to/from Stand, Bed to chair/wheelchair/BSC Sit to Stand: Mod assist   Step pivot transfers: Mod assist, Max assist       General transfer comment: unsteady labored movement with difficulty maintaining standing balance due to buckling of knees    Ambulation/Gait Ambulation/Gait assistance: Mod assist, Max assist Gait Distance (Feet): 5 Feet Assistive device: Rolling walker (2 wheels) Gait Pattern/deviations: Decreased step length - right, Decreased step length - left, Decreased stride length, Ataxic, Trunk flexed, Knees buckling Gait velocity: slow     General Gait Details: limited to a few slow labored unsteady side steps before having to sit due to weakness with buckling of knees   Stairs             Wheelchair Mobility     Tilt Bed    Modified Rankin (Stroke Patients Only)       Balance Overall balance assessment: Needs assistance Sitting-balance support: Feet supported, No upper extremity supported Sitting balance-Leahy Scale: Fair Sitting balance - Comments: fair/good seated at EOB   Standing balance support: Reliant on assistive device for balance,  During functional activity, Bilateral upper extremity supported Standing balance-Leahy Scale: Poor Standing balance comment: using RW                            Communication Communication Communication: Impaired Factors Affecting Communication:  Hearing impaired;Other (comment)  Cognition Arousal: Alert Behavior During Therapy: WFL for tasks assessed/performed                           PT - Cognition Comments: Presents more alert and cooperative Following commands: Impaired Following commands impaired: Follows one step commands with increased time    Cueing Cueing Techniques: Verbal cues, Tactile cues  Exercises General Exercises - Lower Extremity Long Arc Quad: Seated, AROM, Strengthening, Both, 10 reps Hip Flexion/Marching: Seated, AROM, Strengthening, Both, 10 reps Toe Raises: Seated, AROM, Strengthening, Both, 10 reps Heel Raises: Seated, AROM, Strengthening, Both, 10 reps    General Comments        Pertinent Vitals/Pain Pain Assessment Pain Assessment: Faces Faces Pain Scale: Hurts a little bit Pain Location: BLE Pain Descriptors / Indicators: Sore Pain Intervention(s): Limited activity within patient's tolerance, Monitored during session, Repositioned    Home Living                          Prior Function            PT Goals (current goals can now be found in the care plan section) Acute Rehab PT Goals Patient Stated Goal: Return home PT Goal Formulation: With patient Time For Goal Achievement: 04/07/24 Potential to Achieve Goals: Good Progress towards PT goals: Progressing toward goals    Frequency    Min 3X/week      PT Plan      Co-evaluation              AM-PAC PT 6 Clicks Mobility   Outcome Measure  Help needed turning from your back to your side while in a flat bed without using bedrails?: A Lot Help needed moving from lying on your back to sitting on the side of a flat bed without using bedrails?: A Lot Help needed moving to and from a bed to a chair (including a wheelchair)?: A Lot Help needed standing up from a chair using your arms (e.g., wheelchair or bedside chair)?: A Lot Help needed to walk in hospital room?: A Lot Help needed climbing 3-5 steps  with a railing? : Total 6 Click Score: 11    End of Session   Activity Tolerance: Patient tolerated treatment well;Patient limited by fatigue Patient left: in chair;with call bell/phone within reach Nurse Communication: Mobility status PT Visit Diagnosis: Repeated falls (R29.6);Muscle weakness (generalized) (M62.81);Other abnormalities of gait and mobility (R26.89)     Time: 8857-8796 PT Time Calculation (min) (ACUTE ONLY): 21 min  Charges:    $Therapeutic Exercise: 8-22 mins $Therapeutic Activity: 8-22 mins PT General Charges $$ ACUTE PT VISIT: 1 Visit                     2:25 PM, 04/07/24 Lynwood Music, MPT Physical Therapist with [facility] [phone] office [phone] mobile phone

## 2024-04-07 NOTE — Progress Notes (Signed)
 Tried calling report to [facility], no answer. Patient will be going to room 159.

## 2024-04-07 NOTE — Care Management Important Message (Signed)
 Important Message  Patient Details  Name: Guy Roberson MRN: [id number] Date of Birth: 03-28-1932   Important Message Given:  Yes - Medicare IM     [name] [name] 04/07/2024, 11:15 AM

## 2024-04-07 NOTE — Discharge Summary (Signed)
 Physician Discharge Summary   Patient: Guy Roberson MRN: [id number] DOB: 1932/02/26  Admit date:     03/28/2024  Discharge date: 04/07/24  Discharge Physician: Eric Nunnery   PCP: [name], MD   Recommendations at discharge:  Repeat CBC to follow hemoglobin and platelet count trend/stability Repeat basic metabolic panel to follow electrolytes and renal function Reassess patient volume status and continue adjusting diuretic therapy as required.  Discharge Diagnoses: Principal Problem:   Acute on chronic heart failure with preserved ejection fraction (HFpEF) (HCC) Active Problems:   Type 2 diabetes mellitus with other circulatory complications (HCC)   Essential hypertension   CAD (coronary artery disease)   CKD stage 3a, GFR 45-59 ml/min (HCC)   Acute metabolic encephalopathy   Thrombocytopenia  Brief Narrative:    Guy Roberson is a 89 y.o. male with medical history significant for hypertension, type 2 diabetes mellitus, CAD status post CABG, complete heart block with pacemaker, CKD 3A, chronic HFpEF, and BPH who presents with shortness of breath and increased confusion.   Patient is brought in by his son who is concerned that the patient has had worsening shortness of breath over the course of a month or so.  There has not been much cough and no fever has been documented.  Patient has had increased lower extremity swelling and was unable to get his shoes on today due to this.  Shortness of breath was worse last night and did not improve with albuterol  MDI.  Family also notes that the patient seems to be hallucinating and more confused than usual.   ED Course: Upon arrival to the ED, patient is found to be afebrile and saturating mid 90s on room air with stable BP.  Labs are most notable for creatinine 1.32, normal WBC, platelets 107,000, and proBNP 3949.  CTA chest is negative for PE but notable for small right pleural effusion and chronic RML volume loss.  There are no acute  findings on head CT or CT of the abdomen and pelvis.   Patient admitted for CHF exacerbation/encephalopathy    Assessment and Plan: 1. Acute on chronic HFpEF  - EF was 55-60% on echo from September 2024.   -Echo/LVEF 50%.  Severely elevated RVSP.  CT shows no PE - Initially diuresed with IV Lasix  60 mg twice daily, held for a few days before restarting 40 mg p.o. daily on 11/3. -Appears very close to euvolemic state. - EKG with paced rhythm - Creatinine 1.6->1.9->1.77->1.5 >> 1.47 will continue to monitor intermittently. - Continue to follow electrolytes trend intermittently. - Continue strict I's and O's and daily weights.  - Continue metoprolol  with holding parameters.   2.  Severe valvular disease, moderate AI, MR, moderate to severe TR, severe pulmonary hypertension. - Diuretics as above, beta-blockers.  Not a surgical candidate. - Continue conservative medical management.   2. Acute encephalopathy/superimposed delirium - Family is concerned that patient has been more disoriented lately  - No acute findings on head CT, no infectious s/s, CT chest abdomen and pelvis unremarkable to suggest infection.  UA is negative too -TSH, ammonia within normal limits, B12 982 ,RPR nonreactive -Follow delirium precautions.   - UA negative for UTI.   - Mental status is waxing and waning .   - Continue low-dose Seroquel , constant reorientation and supportive care. -Patient mentation significantly improved, following commands appropriately and interacting with family members.   3.  Hypercalcemia: Calcium  10.7 on presentation has normalized.   -Zometa given 10/30.SABRA   -  Continue to follow calcium  level - Patient advised to maintain adequate oral hydration. -Continue to follow ultralights trend.   3. Thrombocytopenia  - Platelets 107,000 on admission, down from 112k on 03/09/24 .   - LFTs are normal; WBC is normal and there is a mild normocytic anemia   - Holding heparin  at the moment in the  setting of rectal bleeding.   4. CAD /PPM - Hx of CABG in 2011  - No anginal symptoms  - Continue Zocor , ASA, and metoprolol .   - Continue to hold metoprolol  if heart rate less than 50. - Continue telemetry monitoring.   5. Type II DM /hypoglycemia - A1c was 6.7% in August 2025  - Continue to follow CBGs fluctuation and adjust hypoglycemic regimen as needed - At discharge planning to restart home hypoglycemic regimen.   6.  Rectal bleeding  - Appears to be associated with internal hemorrhoids - Patient reports no pain - Hemoglobin overall stable - Hemorrhoidal cream has been ordered - SCDs for DVT prophylaxis was used. - Follow clinical response and keep an eye on patient's hemoglobin trend.   7.  Acute kidney injury on CKD 3A  - Overall improved and now appears close to baseline after diuresis. - Continue to renally-dose medications   -Continue to follow renal function trend with repeat basic metabolic panel follow-up visit.   Consultants: None Procedures performed: See below for x-ray reports. Disposition: Skilled nursing facility Diet recommendation: Heart healthy/modified carbohydrate diet.  Dysphagia 3 consistency.  DISCHARGE MEDICATION: Allergies as of 04/07/2024       Reactions   Other Other (See Comments)   All sleep aids Makes him crazy, hallucinations, vivid dreams   Penicillins Other (See Comments)   Unknown  Childhood allergy Disorder of immune function        Medication List     STOP taking these medications    AZO Urinary Pain Relief 95 MG tablet Generic drug: phenazopyridine    Cranberry 500 MG Tabs   lisinopril  10 MG tablet Commonly known as: ZESTRIL    potassium chloride  SA 20 MEQ tablet Commonly known as: KLOR-CON  M       TAKE these medications    acetaminophen  650 MG CR tablet Commonly known as: TYLENOL  Take by mouth every 8 (eight) hours as needed for pain.   albuterol  108 (90 Base) MCG/ACT inhaler Commonly known as:  VENTOLIN  HFA Inhale 2 puffs into the lungs every 6 (six) hours as needed for wheezing or shortness of breath. What changed: when to take this   aspirin  EC 81 MG tablet Take 1 tablet (81 mg total) by mouth daily. Swallow whole. Start taking on: April 08, 2024   cephALEXin  250 MG capsule Commonly known as: KEFLEX  Take 250 mg by mouth at bedtime.   furosemide  40 MG tablet Commonly known as: Lasix  Take 1 tablet (40 mg total) by mouth daily. What changed: how much to take   guaiFENesin -dextromethorphan  100-10 MG/5ML syrup Commonly known as: ROBITUSSIN DM Take 10 mLs by mouth every 4 (four) hours as needed for cough.   lactobacillus acidophilus Tabs tablet Take 1 tablet by mouth daily.   metoprolol  tartrate 25 MG tablet Commonly known as: LOPRESSOR  Take 1 tablet (25 mg total) by mouth 2 (two) times daily. What changed:  medication strength how much to take   NovoLOG  FlexPen 100 UNIT/ML FlexPen Generic drug: insulin  aspart Inject 3-5 Units into the skin 3 (three) times daily before meals. Provide 3 units for blood sugar between 200 and 250;  5 units for blood sugar 250-300.  Okay not to COVID sugar less than 200. What changed:  how much to take additional instructions   pantoprazole  20 MG tablet Commonly known as: PROTONIX  TAKE ONE TABLET (20MG  TOTAL) BY MOUTH TWO TIMES DAILY BEFORE A MEAL What changed:  how much to take how to take this when to take this additional instructions   phenylephrine -shark liver oil-mineral oil-petrolatum 0.25-14-74.9 % rectal ointment Commonly known as: PREPARATION H Place 1 Application rectally 2 (two) times daily as needed for hemorrhoids.   polyethylene glycol 17 g packet Commonly known as: MIRALAX  / GLYCOLAX  Take 17 g by mouth daily. Start taking on: April 08, 2024   QUEtiapine  25 MG tablet Commonly known as: SEROQUEL  Take 0.5 tablets (12.5 mg total) by mouth at bedtime.   senna-docusate 8.6-50 MG tablet Commonly known as:  Senokot-S Take 1 tablet by mouth at bedtime as needed for mild constipation.   simvastatin  20 MG tablet Commonly known as: ZOCOR  Take 1 tablet (20 mg total) by mouth daily.   Sure Comfort Pen Needles 31G X 5 MM Misc Generic drug: Insulin  Pen Needle USE AS DIRECTED UP TO 3ITIMES DAILY.   tamsulosin  0.4 MG Caps capsule Commonly known as: FLOMAX  Take 1 capsule (0.4 mg total) by mouth daily after supper. What changed: when to take this        Contact information for follow-up providers     [name], MD. Schedule an appointment as soon as possible for a visit in 2 week(s).   Specialty: Internal Medicine Why: After discharge from the skilled nursing facility. Contact information: [address] [place] [facility] [address] [phone]              Contact information for after-discharge care     Destination     [facility] .   Service: Skilled Nursing Contact information: [address] [place]  [address] [phone]                    Discharge Exam: Filed Weights   04/05/24 0438 04/06/24 0315 04/07/24 0445  Weight: 72.5 kg 72.5 kg 73.7 kg   General exam: Alert, awake, oriented x2; patient was significantly more interactive and able to follow simple commands appropriately.  No significant bleeding reported.   Respiratory system: Good saturation on 2 L supplementation; positive scattered rhonchi without significant crackles on exam.  Decreased breath sounds appreciated at the bases. Cardiovascular system: Positive murmur, no rubs, no gallops, no JVD.  Rate controlled. Gastrointestinal system: Abdomen is nondistended, soft and nontender. No organomegaly or masses felt. Normal bowel sounds heard. Central nervous system: Weak and deconditioned; no focal deficit.  Limited examination secondary to underlying cognitive deficit. Extremities: No cyanosis or clubbing; no edema. Skin: No petechiae. Psychiatry: Judgement and insight  appear impaired secondary to dementia.  Condition at discharge: Stable and improved.  The results of significant diagnostics from this hospitalization (including imaging, microbiology, ancillary and laboratory) are listed below for reference.   Imaging Studies: DG CHEST PORT 1 VIEW Result Date: 04/01/2024 CLINICAL DATA:  Shortness of breath and cough EXAM: PORTABLE CHEST 1 VIEW COMPARISON:  Chest radiograph dated 03/28/2024 FINDINGS: Lines/tubes: Left chest wall pacemaker leads project over the right atrium and ventricle. Lungs: Low lung volumes with bronchovascular crowding. Bibasilar patchy opacities. Pleura: Blunting of the right costophrenic angle. No pneumothorax. Left costophrenic angle is not entirely included within the field of view. Heart/mediastinum: Similar enlarged cardiomediastinal silhouette. Bones: Median  sternotomy wires are nondisplaced. IMPRESSION: 1. Low lung volumes with bronchovascular crowding. Bibasilar patchy opacities, likely atelectasis. 2. Blunting of the right costophrenic angle, which may represent a small pleural effusion. Electronically Signed   By: Limin  Xu M.D.   On: 04/01/2024 12:47   ECHOCARDIOGRAM COMPLETE Result Date: 03/29/2024    ECHOCARDIOGRAM REPORT   Patient Name:   OTHAR CURTO Date of Exam: 03/29/2024 Medical Rec #:  [id number]     Height:       67.0 in Accession #:    [id number]    Weight:       176.8 lb Date of Birth:  14-Apr-1932    BSA:          1.919 m Patient Age:    89 years      BP:           126/58 mmHg Patient Gender: M             HR:           63 bpm. Exam Location:  [facility] Procedure: 2D Echo, Cardiac Doppler and Color Doppler (Both Spectral and Color            Flow Doppler were utilized during procedure). Indications:    Congestive Heart Failure I50.9  History:        Patient has prior history of Echocardiogram examinations, most                 recent 02/19/2023. ASCVD and CHF, CAD, Prior CABG, CKD; Risk                 Factors:Diabetes,  Hypertension and Dyslipidemia.  Sonographer:    Koleen Popper RDCS Referring Phys: [id number] [facility] SIGDEL  Sonographer Comments: Image acquisition challenging due to respiratory motion and Image acquisition challenging due to uncooperative patient. IMPRESSIONS  1. Left ventricular ejection fraction, by estimation, is 50%. The left ventricle has low normal function. The left ventricle has no regional wall motion abnormalities. Left ventricular diastolic parameters are indeterminate.  2. Right ventricular systolic function was not well visualized. The right ventricular size is not well visualized. There is severely elevated pulmonary artery systolic pressure.  3. The mitral valve is abnormal. Moderate mitral valve regurgitation.  4. Tricuspid valve regurgitation is moderate to severe.  5. The aortic valve is tricuspid. There is mild calcification of the aortic valve. There is mild thickening of the aortic valve. Aortic valve regurgitation is mild. No aortic stenosis is present.  6. The inferior vena cava is normal in size with greater than 50% respiratory variability, suggesting right atrial pressure of 3 mmHg. FINDINGS  Left Ventricle: Left ventricular ejection fraction, by estimation, is 50%. The left ventricle has low normal function. The left ventricle has no regional wall motion abnormalities. The left ventricular internal cavity size was normal in size. There is no left ventricular hypertrophy. Left ventricular diastolic parameters are indeterminate. Right Ventricle: The right ventricular size is not well visualized. Right vetricular wall thickness was not well visualized. Right ventricular systolic function was not well visualized. There is severely elevated pulmonary artery systolic pressure. The tricuspid regurgitant velocity is 4.01 m/s, and with an assumed right atrial pressure of 3 mmHg, the estimated right ventricular systolic pressure is 67.3 mmHg. Left Atrium: Left atrial size was normal in size. Right  Atrium: Right atrial size was normal in size. Pericardium: There is no evidence of pericardial effusion. Mitral Valve: MR vena contracta is 0.5 cm. The mitral valve is  abnormal. Moderate mitral valve regurgitation. Tricuspid Valve: The tricuspid valve is not well visualized. Tricuspid valve regurgitation is moderate to severe. No evidence of tricuspid stenosis. Aortic Valve: The aortic valve is tricuspid. There is mild calcification of the aortic valve. There is mild thickening of the aortic valve. There is mild aortic valve annular calcification. Aortic valve regurgitation is mild. Aortic regurgitation PHT measures 299 msec. No aortic stenosis is present. Aortic valve mean gradient measures 2.6 mmHg. Aortic valve peak gradient measures 5.1 mmHg. Aortic valve area, by VTI measures 2.24 cm. Pulmonic Valve: The pulmonic valve was not well visualized. Pulmonic valve regurgitation is mild to moderate. No evidence of pulmonic stenosis. Aorta: The aortic root is normal in size and structure and the ascending aorta was not well visualized. Venous: The inferior vena cava is normal in size with greater than 50% respiratory variability, suggesting right atrial pressure of 3 mmHg. IAS/Shunts: No atrial level shunt detected by color flow Doppler. Additional Comments: A device lead is visualized in the right atrium and right ventricle.  LEFT VENTRICLE PLAX 2D LVIDd:         4.30 cm   Diastology LVIDs:         3.20 cm   LV e' medial:  3.90 cm/s LV PW:         1.00 cm   LV e' lateral: 5.78 cm/s LV IVS:        1.00 cm LVOT diam:     2.30 cm LV SV:         73 LV SV Index:   38 LVOT Area:     4.15 cm  RIGHT VENTRICLE          IVC RV Basal diam:  4.40 cm  IVC diam: 1.40 cm LEFT ATRIUM             Index        RIGHT ATRIUM           Index LA diam:        5.40 cm 2.81 cm/m   RA Area:     16.60 cm LA Vol (A2C):   47.7 ml 24.86 ml/m  RA Volume:   47.60 ml  24.80 ml/m LA Vol (A4C):   58.2 ml 30.33 ml/m LA Biplane Vol: 53.2 ml 27.72  ml/m  AORTIC VALVE AV Area (Vmax):    2.71 cm AV Area (Vmean):   2.54 cm AV Area (VTI):     2.24 cm AV Vmax:           112.66 cm/s AV Vmean:          76.166 cm/s AV VTI:            0.325 m AV Peak Grad:      5.1 mmHg AV Mean Grad:      2.6 mmHg LVOT Vmax:         73.40 cm/s LVOT Vmean:        46.600 cm/s LVOT VTI:          0.175 m LVOT/AV VTI ratio: 0.54 AI PHT:            299 msec  AORTA Ao Root diam: 3.30 cm MR Peak grad:    71.2 mmHg    TRICUSPID VALVE MR Vmax:         422.00 cm/s  TR Peak grad:   64.3 mmHg MR PISA:         7.60 cm     TR Mean  grad:   37.0 mmHg MR PISA Eff ROA: 69 mm       TR Vmax:        401.00 cm/s MR PISA Radius:  1.10 cm      TR Vmean:       284.0 cm/s                                SHUNTS                               Systemic VTI:  0.18 m                               Systemic Diam: 2.30 cm Dorn Ross MD Electronically signed by Dorn Ross MD Signature Date/Time: 03/29/2024/7:35:19 PM    Final    CT Head Wo Contrast Result Date: 03/28/2024 EXAM: CT HEAD WITHOUT CONTRAST 03/28/2024 07:12:17 PM TECHNIQUE: CT of the head was performed without the administration of intravenous contrast. Automated exposure control, iterative reconstruction, and/or weight based adjustment of the mA/kV was utilized to reduce the radiation dose to as low as reasonably achievable. COMPARISON: 01/27/2024 CLINICAL HISTORY: Mental status change, unknown cause. Pt arrived via POV c/o on-going SOB since the end of last month. Pt reports having a pacemaker generator changed on 10/9 and was told the SOB would resolve following this procedure, however family report the Pt has not made any improvements. Pts family ; also report being advised to come to the ER for evaluation of a possible UTI. FINDINGS: BRAIN AND VENTRICLES: No acute hemorrhage. No evidence of acute infarct. Moderate parenchymal volume loss with proportional prominence of ventricles and sulci. Mild periventricular white matter changes,  likely sequela of chronic small vessel ischemic disease. No extra-axial collection. No mass effect or midline shift. Calcified atherosclerotic plaque in cavernous/supraclinoid ICA and intradural vertebral arteries. ORBITS: Bilateral lens replacements noted. SINUSES: No acute abnormality. SOFT TISSUES AND SKULL: No acute soft tissue abnormality. No skull fracture. IMPRESSION: 1. No acute intracranial abnormality. Electronically signed by: Morene Hoard MD 03/28/2024 07:39 PM EDT RP Workstation: [id number]   CT Angio Chest PE W/Cm &/Or Wo Cm Result Date: 03/28/2024 EXAM: CTA CHEST PE WITHOUT AND WITH CONTRAST CT ABDOMEN AND PELVIS WITHOUT AND WITH CONTRAST 03/28/2024 07:12:17 PM TECHNIQUE: CTA of the chest was performed after the administration of 80 mL of iohexol  (OMNIPAQUE ) 350 MG/ML injection. Multiplanar reformatted images are provided for review. MIP images are provided for review. CT of the abdomen and pelvis was performed without and with the administration of intravenous contrast. Automated exposure control, iterative reconstruction, and/or weight based adjustment of the mA/kV was utilized to reduce the radiation dose to as low as reasonably achievable. COMPARISON: None available. CLINICAL HISTORY: FINDINGS: CHEST: PULMONARY ARTERIES: Pulmonary arteries are adequately opacified for evaluation. No intraluminal filling defect to suggest pulmonary embolism. Main pulmonary artery is normal in caliber. MEDIASTINUM: Heart is enlarged. There are atherosclerotic calcifications of the aorta. Left-sided pacemaker is present. Thyroid gland is significantly enlarged, left greater than right and diffusely heterogeneous. Left thyroid nodule measures 2.7 cm similar to the prior study. No mediastinal lymphadenopathy. The pericardium demonstrates no acute abnormality. LUNGS AND PLEURA: There is a small right pleural effusion. Volume loss and collapse of the right middle lobe appears unchanged from prior. Stable  elevation of the right  hemidiaphragm with some right basilar atelectasis. No pneumothorax. SOFT TISSUES AND BONES: Sternotomy wires are present. The bones are diffusely osteopenic. ABDOMEN AND PELVIS: LIVER: The liver is unremarkable. GALLBLADDER AND BILE DUCTS: Gallstones are present. No biliary ductal dilatation. SPLEEN: Spleen demonstrates no acute abnormality. PANCREAS: Pancreas demonstrates no acute abnormality. ADRENAL GLANDS: Adrenal glands demonstrate no acute abnormality. KIDNEYS, URETERS AND BLADDER: No stones in the kidneys or ureters. No hydronephrosis. No perinephric or periureteral stranding. Urinary bladder is unremarkable. GI AND BOWEL: There is colonic diverticulosis. Stomach and duodenal sweep demonstrate no acute abnormality. There is no bowel obstruction. No abnormal bowel wall thickening or distension. REPRODUCTIVE: Reproductive organs are unremarkable. PERITONEUM AND RETROPERITONEUM: There is trace free fluid in the left upper quadrant. No free air. LYMPH NODES: No lymphadenopathy. BONES AND SOFT TISSUES: The visualized bones are diffusely osteopenic. No focal soft tissue abnormality. IMPRESSION: 1. No evidence of pulmonary embolism. 2. Small right pleural effusion. 3. Chronic right middle lobe volume loss/collapse and elevated right hemidiaphragm with right basilar atelectasis, unchanged from prior. 4. Enlarged thyroid gland with a 2.7 cm left thyroid nodule, similar to prior study. Follow-up thyroid ultrasound recommended if not performed. 5. Cardiomegaly. 6. Gallstones and colonic diverticulosis without evidence of diverticulitis. Electronically signed by: Greig Pique MD 03/28/2024 07:33 PM EDT RP Workstation: [id number]   CT ABDOMEN PELVIS W CONTRAST Result Date: 03/28/2024 EXAM: CTA CHEST PE WITHOUT AND WITH CONTRAST CT ABDOMEN AND PELVIS WITHOUT AND WITH CONTRAST 03/28/2024 07:12:17 PM TECHNIQUE: CTA of the chest was performed after the administration of 80 mL of iohexol  (OMNIPAQUE )  350 MG/ML injection. Multiplanar reformatted images are provided for review. MIP images are provided for review. CT of the abdomen and pelvis was performed without and with the administration of intravenous contrast. Automated exposure control, iterative reconstruction, and/or weight based adjustment of the mA/kV was utilized to reduce the radiation dose to as low as reasonably achievable. COMPARISON: None available. CLINICAL HISTORY: FINDINGS: CHEST: PULMONARY ARTERIES: Pulmonary arteries are adequately opacified for evaluation. No intraluminal filling defect to suggest pulmonary embolism. Main pulmonary artery is normal in caliber. MEDIASTINUM: Heart is enlarged. There are atherosclerotic calcifications of the aorta. Left-sided pacemaker is present. Thyroid gland is significantly enlarged, left greater than right and diffusely heterogeneous. Left thyroid nodule measures 2.7 cm similar to the prior study. No mediastinal lymphadenopathy. The pericardium demonstrates no acute abnormality. LUNGS AND PLEURA: There is a small right pleural effusion. Volume loss and collapse of the right middle lobe appears unchanged from prior. Stable elevation of the right hemidiaphragm with some right basilar atelectasis. No pneumothorax. SOFT TISSUES AND BONES: Sternotomy wires are present. The bones are diffusely osteopenic. ABDOMEN AND PELVIS: LIVER: The liver is unremarkable. GALLBLADDER AND BILE DUCTS: Gallstones are present. No biliary ductal dilatation. SPLEEN: Spleen demonstrates no acute abnormality. PANCREAS: Pancreas demonstrates no acute abnormality. ADRENAL GLANDS: Adrenal glands demonstrate no acute abnormality. KIDNEYS, URETERS AND BLADDER: No stones in the kidneys or ureters. No hydronephrosis. No perinephric or periureteral stranding. Urinary bladder is unremarkable. GI AND BOWEL: There is colonic diverticulosis. Stomach and duodenal sweep demonstrate no acute abnormality. There is no bowel obstruction. No abnormal  bowel wall thickening or distension. REPRODUCTIVE: Reproductive organs are unremarkable. PERITONEUM AND RETROPERITONEUM: There is trace free fluid in the left upper quadrant. No free air. LYMPH NODES: No lymphadenopathy. BONES AND SOFT TISSUES: The visualized bones are diffusely osteopenic. No focal soft tissue abnormality. IMPRESSION: 1. No evidence of pulmonary embolism. 2. Small right pleural effusion. 3. Chronic right middle  lobe volume loss/collapse and elevated right hemidiaphragm with right basilar atelectasis, unchanged from prior. 4. Enlarged thyroid gland with a 2.7 cm left thyroid nodule, similar to prior study. Follow-up thyroid ultrasound recommended if not performed. 5. Cardiomegaly. 6. Gallstones and colonic diverticulosis without evidence of diverticulitis. Electronically signed by: Greig Pique MD 03/28/2024 07:33 PM EDT RP Workstation: [id number]   DG Chest 2 View Result Date: 03/28/2024 CLINICAL DATA:  Shortness of breath. EXAM: CHEST - 2 VIEW COMPARISON:  01/29/2024 FINDINGS: Prior median sternotomy. Left-sided pacemaker unchanged. Moderate stable elevation of the right hemidiaphragm. Linear density posterior right base likely atelectasis. Left lung is clear. Stable borderline cardiomegaly. Remainder of the exam is unchanged. IMPRESSION: 1. Linear density posterior right base likely atelectasis. 2. Stable borderline cardiomegaly. Electronically Signed   By: Toribio Agreste M.D.   On: 03/28/2024 15:55   EP PPM/ICD IMPLANT Result Date: 03/09/2024 Conclusion: Successful removal of her previously implanted dual-chamber pacemaker which had reached elective replacement, and insertion of new dual-chamber pacemaker in a patient with complete heart block whose old device had reached elective replacement. Danelle Birmingham, MD    Microbiology: Results for orders placed or performed during the hospital encounter of 03/28/24  Resp panel by RT-PCR (RSV, Flu A&B, Covid) Anterior Nasal Swab     Status:  None   Collection Time: 03/28/24  3:32 PM   Specimen: Anterior Nasal Swab  Result Value Ref Range Status   SARS Coronavirus 2 by RT PCR NEGATIVE NEGATIVE Final    Comment: (NOTE) SARS-CoV-2 target nucleic acids are NOT DETECTED.  The SARS-CoV-2 RNA is generally detectable in upper respiratory specimens during the acute phase of infection. The lowest concentration of SARS-CoV-2 viral copies this assay can detect is 138 copies/mL. A negative result does not preclude SARS-Cov-2 infection and should not be used as the sole basis for treatment or other patient management decisions. A negative result may occur with  improper specimen collection/handling, submission of specimen other than nasopharyngeal swab, presence of viral mutation(s) within the areas targeted by this assay, and inadequate number of viral copies(<138 copies/mL). A negative result must be combined with clinical observations, patient history, and epidemiological information. The expected result is Negative.  Fact Sheet for Patients:  bloggercourse.com  Fact Sheet for Healthcare Providers:  seriousbroker.it  This test is no t yet approved or cleared by the [place]  FDA and  has been authorized for detection and/or diagnosis of SARS-CoV-2 by FDA under an Emergency Use Authorization (EUA). This EUA will remain  in effect (meaning this test can be used) for the duration of the COVID-19 declaration under Section 564(b)(1) of the Act, 21 U.S.C.section 360bbb-3(b)(1), unless the authorization is terminated  or revoked sooner.       Influenza A by PCR NEGATIVE NEGATIVE Final   Influenza B by PCR NEGATIVE NEGATIVE Final    Comment: (NOTE) The Xpert Xpress SARS-CoV-2/FLU/RSV plus assay is intended as an aid in the diagnosis of influenza from Nasopharyngeal swab specimens and should not be used as a sole basis for treatment. Nasal washings and aspirates are unacceptable  for Xpert Xpress SARS-CoV-2/FLU/RSV testing.  Fact Sheet for Patients: bloggercourse.com  Fact Sheet for Healthcare Providers: seriousbroker.it  This test is not yet approved or cleared by the [place]  FDA and has been authorized for detection and/or diagnosis of SARS-CoV-2 by FDA under an Emergency Use Authorization (EUA). This EUA will remain in effect (meaning this test can be used) for the duration of the COVID-19 declaration under Section 564(b)(1)  of the Act, 21 U.S.C. section 360bbb-3(b)(1), unless the authorization is terminated or revoked.     Resp Syncytial Virus by PCR NEGATIVE NEGATIVE Final    Comment: (NOTE) Fact Sheet for Patients: bloggercourse.com  Fact Sheet for Healthcare Providers: seriousbroker.it  This test is not yet approved or cleared by the [place]  FDA and has been authorized for detection and/or diagnosis of SARS-CoV-2 by FDA under an Emergency Use Authorization (EUA). This EUA will remain in effect (meaning this test can be used) for the duration of the COVID-19 declaration under Section 564(b)(1) of the Act, 21 U.S.C. section 360bbb-3(b)(1), unless the authorization is terminated or revoked.  Performed at [facility], [address] [place], [place] [address]   MRSA Next Gen by PCR, Nasal     Status: None   Collection Time: 03/29/24 12:00 AM   Specimen: Nasal Mucosa; Nasal Swab  Result Value Ref Range Status   MRSA by PCR Next Gen NOT DETECTED NOT DETECTED Final    Comment: (NOTE) The GeneXpert MRSA Assay (FDA approved for NASAL specimens only), is one component of a comprehensive MRSA colonization surveillance program. It is not intended to diagnose MRSA infection nor to guide or monitor treatment for MRSA infections. Test performance is not FDA approved in patients less than 21 years old. Performed at [facility], [address] [place], [place] [address]     Labs: CBC: Recent Labs  Lab 04/01/24 0328 04/03/24 0429 04/05/24 0505 04/06/24 0319  WBC 8.3 8.6 7.5 6.6  NEUTROABS  --  6.5 5.5  --   HGB 10.1* 10.6* 10.6* 9.9*  HCT 32.1* 33.9* 32.9* 32.4*  MCV 94.1 94.7 94.0 95.6  PLT 85* 85* 118* 137*   Basic Metabolic Panel: Recent Labs  Lab 04/01/24 0328 04/03/24 0429 04/05/24 0505 04/06/24 0319  NA 139 139 144 145  K 3.9 3.2* 3.5 3.9  CL 99 102 107 109  CO2 26 28 25 24   GLUCOSE 80 118* 91 79  BUN 47* 46* 37* 34*  CREATININE 1.77* 1.54* 1.47* 1.47*  CALCIUM  9.7 9.2 9.0 8.9   Liver Function Tests: No results for input(s): AST, ALT, ALKPHOS, BILITOT, PROT, ALBUMIN in the last 168 hours. CBG: Recent Labs  Lab 04/06/24 1116 04/06/24 1620 04/06/24 1941 04/07/24 0712 04/07/24 1124  GLUCAP 109* 115* 134* 96 145*    Discharge time spent:  35 minutes.  Signed: Eric Nunnery, MD [facility] 04/07/2024

## 2024-04-07 NOTE — Progress Notes (Signed)
 Report given to [employer] at the [facility]. All questions and concerns answered. Patient has all personal belongings including discharge package. Patient wheeled to room 159 from [facility].

## 2024-04-09 NOTE — Progress Notes (Deleted)
  Cardiology Office Note   Date:  04/09/2024  ID:  Guy Roberson, Guy Roberson [birth year], MRN [id number] PCP: [name], MD  [facility] Providers Cardiologist:  Danelle Birmingham, MD Electrophysiologist:  Donnice DELENA Primus, MD   History of Present Illness Guy Roberson is a 89 y.o. male with past medical history of CHB (s/p Medtronic PPM placement in 11/2011), CAD (s/p CABG in 2011 with LIMA-LAD, SVG-D1, SVG-OM and SVG-PDA, cath in 2012 showing 4/4 patent grafts), seizures, HTN, HLD, BPH and DM2 who presents s/p MDT DC PPM generator change with Dr. Birmingham on 03/09/24.  ROS: ***  Studies Reviewed      *** Risk Assessment/Calculations {Does this patient have ATRIAL FIBRILLATION?:[phone]} No BP recorded.  {Refresh Note OR Click here to enter BP  :1}***       Physical Exam VS:  There were no vitals taken for this visit.       Wt Readings from Last 3 Encounters:  04/07/24 162 lb 7.7 oz (73.7 kg)  03/09/24 174 lb (78.9 kg)  03/02/24 176 lb 6.4 oz (80 kg)    GEN: Well nourished, well developed in no acute distress NECK: No JVD; No carotid bruits CARDIAC: ***RRR, no murmurs, rubs, gallops RESPIRATORY:  Clear to auscultation without rales, wheezing or rhonchi  ABDOMEN: Soft, non-tender, non-distended EXTREMITIES:  No edema; No deformity   ASSESSMENT AND PLAN ***    {Are you ordering a CV Procedure (e.g. stress test, cath, DCCV, TEE, etc)?   Press F2        :[id number]}  Dispo: ***  Signed, Donnice DELENA Primus, MD

## 2024-04-10 ENCOUNTER — Encounter: Payer: Self-pay | Admitting: Adult Health

## 2024-04-10 ENCOUNTER — Non-Acute Institutional Stay (SKILLED_NURSING_FACILITY): Payer: Self-pay | Admitting: Adult Health

## 2024-04-10 DIAGNOSIS — I5033 Acute on chronic diastolic (congestive) heart failure: Secondary | ICD-10-CM

## 2024-04-10 DIAGNOSIS — Z794 Long term (current) use of insulin: Secondary | ICD-10-CM

## 2024-04-10 DIAGNOSIS — E041 Nontoxic single thyroid nodule: Secondary | ICD-10-CM | POA: Diagnosis not present

## 2024-04-10 DIAGNOSIS — I110 Hypertensive heart disease with heart failure: Secondary | ICD-10-CM

## 2024-04-10 DIAGNOSIS — K5909 Other constipation: Secondary | ICD-10-CM

## 2024-04-10 DIAGNOSIS — E1169 Type 2 diabetes mellitus with other specified complication: Secondary | ICD-10-CM | POA: Diagnosis not present

## 2024-04-10 DIAGNOSIS — N401 Enlarged prostate with lower urinary tract symptoms: Secondary | ICD-10-CM

## 2024-04-10 DIAGNOSIS — K219 Gastro-esophageal reflux disease without esophagitis: Secondary | ICD-10-CM | POA: Diagnosis not present

## 2024-04-10 DIAGNOSIS — N390 Urinary tract infection, site not specified: Secondary | ICD-10-CM

## 2024-04-10 DIAGNOSIS — E1159 Type 2 diabetes mellitus with other circulatory complications: Secondary | ICD-10-CM

## 2024-04-10 DIAGNOSIS — J9611 Chronic respiratory failure with hypoxia: Secondary | ICD-10-CM | POA: Diagnosis not present

## 2024-04-10 DIAGNOSIS — D696 Thrombocytopenia, unspecified: Secondary | ICD-10-CM

## 2024-04-10 DIAGNOSIS — N1831 Chronic kidney disease, stage 3a: Secondary | ICD-10-CM

## 2024-04-10 DIAGNOSIS — E1122 Type 2 diabetes mellitus with diabetic chronic kidney disease: Secondary | ICD-10-CM

## 2024-04-10 DIAGNOSIS — R29818 Other symptoms and signs involving the nervous system: Secondary | ICD-10-CM
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
## 2024-04-11 ENCOUNTER — Ambulatory Visit: Admitting: Student in an Organized Health Care Education/Training Program

## 2024-04-11 ENCOUNTER — Encounter: Payer: Self-pay | Admitting: Internal Medicine

## 2024-04-11 ENCOUNTER — Non-Acute Institutional Stay (SKILLED_NURSING_FACILITY): Admitting: Internal Medicine

## 2024-04-11 DIAGNOSIS — R29818 Other symptoms and signs involving the nervous system: Secondary | ICD-10-CM

## 2024-04-11 DIAGNOSIS — I5033 Acute on chronic diastolic (congestive) heart failure: Secondary | ICD-10-CM

## 2024-04-11 DIAGNOSIS — E1122 Type 2 diabetes mellitus with diabetic chronic kidney disease: Secondary | ICD-10-CM | POA: Insufficient documentation

## 2024-04-11 DIAGNOSIS — R4189 Other symptoms and signs involving cognitive functions and awareness: Secondary | ICD-10-CM

## 2024-04-11 DIAGNOSIS — E1159 Type 2 diabetes mellitus with other circulatory complications: Secondary | ICD-10-CM

## 2024-04-11 DIAGNOSIS — D631 Anemia in chronic kidney disease: Secondary | ICD-10-CM | POA: Insufficient documentation

## 2024-04-11 DIAGNOSIS — N1831 Chronic kidney disease, stage 3a: Secondary | ICD-10-CM

## 2024-04-11 DIAGNOSIS — N390 Urinary tract infection, site not specified: Secondary | ICD-10-CM | POA: Insufficient documentation

## 2024-04-11 DIAGNOSIS — J9611 Chronic respiratory failure with hypoxia: Secondary | ICD-10-CM | POA: Insufficient documentation
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
## 2024-04-11 NOTE — Assessment & Plan Note (Addendum)
 Monitor daily weights with adjustment in diuretics as clinically indicated.  PCXR performed 04/10/2024 at SNF for wheezing revealed a moderate right pleural effusion.  CM present. As this represents recurrent presentation of acute on chronic diastolic congestive heart failure; ongoing Cardiology monitor indicated to optimize stabilization and prevent recurrent hospitalization.

## 2024-04-11 NOTE — Patient Instructions (Signed)
 See assessment and plan under each diagnosis in the problem list and acutely for this visit

## 2024-04-11 NOTE — Assessment & Plan Note (Signed)
 Creatinine at discharge 1.47 with a GFR of 45 indicating borderline stage III/B CKD.  Med list reviewed; no indication for change in meds or dosages at this time.  Continue to monitor.

## 2024-04-11 NOTE — Assessment & Plan Note (Signed)
 01/27/2024 A1c 6.7%.  While hospitalized with acute on chronic congestive heart failure 10/28 - 11/7 glucoses ranged from 78 up to 250.  Both were outliers. A1c goal will be less than 8% in this gentleman due to age and multiple advanced comorbidities.  A1c should be updated late this month or early in December. Here at the SNF glucoses have ranged from 122 up to 319 for an average of 218.3.

## 2024-04-11 NOTE — Assessment & Plan Note (Signed)
 During hospitalization 10/28 - 04/17/2024 TSH was therapeutic and B12 normal.  RPR was nonreactive ammonia level normal. 03/28/2024 CT of the head reveals volume loss and ventricular enlargement as well as chronic ischemic white matter disease.  Encephalopathy with hallucinations superimposed on baseline vascular dementia.  Continue Seroquel  and reorientation.

## 2024-04-13 ENCOUNTER — Other Ambulatory Visit (HOSPITAL_COMMUNITY)
Admission: RE | Admit: 2024-04-13 | Discharge: 2024-04-13 | Disposition: A | Discharge status: Home / Self Care | Source: Skilled Nursing Facility | Attending: Adult Health | Admitting: Adult Health

## 2024-04-13 DIAGNOSIS — I5032 Chronic diastolic (congestive) heart failure: Secondary | ICD-10-CM | POA: Insufficient documentation

## 2024-04-13 LAB — CBC
HCT: 31.2 % — ABNORMAL LOW (ref 39.0–52.0)
Hemoglobin: 10.1 g/dL — ABNORMAL LOW (ref 13.0–17.0)
MCH: 29.7 pg (ref 26.0–34.0)
MCHC: 32.4 g/dL (ref 30.0–36.0)
MCV: 91.8 fL (ref 80.0–100.0)
Platelets: 199 K/uL (ref 150–400)
RBC: 3.4 MIL/uL — ABNORMAL LOW (ref 4.22–5.81)
RDW: 15.2 % (ref 11.5–15.5)
WBC: 8.1 K/uL (ref 4.0–10.5)
nRBC: 0 % (ref 0.0–0.2)

## 2024-04-13 LAB — BASIC METABOLIC PANEL WITH GFR
Anion gap: 10 (ref 5–15)
BUN: 25 mg/dL — ABNORMAL HIGH (ref 8–23)
CO2: 23 mmol/L (ref 22–32)
Calcium: 8.3 mg/dL — ABNORMAL LOW (ref 8.9–10.3)
Chloride: 101 mmol/L (ref 98–111)
Creatinine, Ser: 1.48 mg/dL — ABNORMAL HIGH (ref 0.61–1.24)
GFR, Estimated: 45 mL/min — ABNORMAL LOW (ref >60)
Glucose, Bld: 79 mg/dL (ref 70–99)
Potassium: 4.4 mmol/L (ref 3.5–5.1)
Sodium: 134 mmol/L — ABNORMAL LOW (ref 135–145)

## 2024-04-13 LAB — PRO BRAIN NATRIURETIC PEPTIDE: Pro Brain Natriuretic Peptide: 5179 pg/mL — ABNORMAL HIGH (ref <300.0)

## 2024-04-20 ENCOUNTER — Non-Acute Institutional Stay (SKILLED_NURSING_FACILITY): Payer: Self-pay | Admitting: Adult Health

## 2024-04-20 ENCOUNTER — Encounter: Payer: Self-pay | Admitting: Adult Health

## 2024-04-20 ENCOUNTER — Ambulatory Visit

## 2024-04-20 ENCOUNTER — Other Ambulatory Visit: Payer: Self-pay | Admitting: Adult Health

## 2024-04-20 DIAGNOSIS — J9611 Chronic respiratory failure with hypoxia: Secondary | ICD-10-CM | POA: Diagnosis not present

## 2024-04-20 DIAGNOSIS — I129 Hypertensive chronic kidney disease with stage 1 through stage 4 chronic kidney disease, or unspecified chronic kidney disease: Secondary | ICD-10-CM | POA: Diagnosis not present

## 2024-04-20 DIAGNOSIS — N183 Chronic kidney disease, stage 3 unspecified: Secondary | ICD-10-CM | POA: Diagnosis not present

## 2024-04-20 DIAGNOSIS — I5032 Chronic diastolic (congestive) heart failure: Secondary | ICD-10-CM

## 2024-04-20 DIAGNOSIS — I5033 Acute on chronic diastolic (congestive) heart failure: Secondary | ICD-10-CM

## 2024-04-20 MED ORDER — QUETIAPINE FUMARATE 25 MG PO TABS: 12.5000 mg | ORAL_TABLET | Freq: Every day | ORAL | 0 refills | Status: DC

## 2024-04-20 MED ORDER — SIMVASTATIN 20 MG PO TABS: 20.0000 mg | ORAL_TABLET | Freq: Every day | ORAL | 0 refills | Status: DC

## 2024-04-20 MED ORDER — TAMSULOSIN HCL 0.4 MG PO CAPS: 0.4000 mg | ORAL_CAPSULE | Freq: Every day | ORAL | 0 refills | Status: DC

## 2024-04-20 MED ORDER — ALBUTEROL SULFATE HFA 108 (90 BASE) MCG/ACT IN AERS: 2.0000 | INHALATION_SPRAY | Freq: Four times a day (QID) | RESPIRATORY_TRACT | 0 refills | Status: DC | PRN

## 2024-04-20 MED ORDER — FUROSEMIDE 40 MG PO TABS: 40.0000 mg | ORAL_TABLET | Freq: Every day | ORAL | 0 refills | Status: DC

## 2024-04-20 MED ORDER — METOPROLOL TARTRATE 25 MG PO TABS: 25.0000 mg | ORAL_TABLET | Freq: Two times a day (BID) | ORAL | 0 refills | Status: DC

## 2024-04-20 MED ORDER — CEPHALEXIN 250 MG PO CAPS: 250.0000 mg | ORAL_CAPSULE | Freq: Every day | ORAL | 0 refills | Status: DC
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
## 2024-04-21 LAB — CUP PACEART REMOTE DEVICE CHECK
Battery Remaining Longevity: 121 mo
Battery Voltage: 3.21 V
Brady Statistic AP VP Percent: 90.46 %
Brady Statistic AP VS Percent: 0 %
Brady Statistic AS VP Percent: 9.51 %
Brady Statistic AS VS Percent: 0.02 %
Brady Statistic RA Percent Paced: 90.44 %
Brady Statistic RV Percent Paced: 99.97 %
Date Time Interrogation Session: 20251119225749
Implantable Lead Connection Status: 753985
Implantable Lead Connection Status: 753985
Implantable Lead Implant Date: 20130730
Implantable Lead Implant Date: 20130731
Implantable Lead Location: 753859
Implantable Lead Location: 753860
Implantable Lead Model: 5076
Implantable Lead Model: 5076
Implantable Pulse Generator Implant Date: 20251009
Lead Channel Impedance Value: 304 Ohm
Lead Channel Impedance Value: 323 Ohm
Lead Channel Impedance Value: 380 Ohm
Lead Channel Impedance Value: 475 Ohm
Lead Channel Pacing Threshold Amplitude: 1.125 V
Lead Channel Pacing Threshold Amplitude: 1.25 V
Lead Channel Pacing Threshold Pulse Width: 0.4 ms
Lead Channel Pacing Threshold Pulse Width: 0.4 ms
Lead Channel Sensing Intrinsic Amplitude: 1.5 mV
Lead Channel Sensing Intrinsic Amplitude: 1.5 mV
Lead Channel Setting Pacing Amplitude: 1.5 V
Lead Channel Setting Pacing Amplitude: 2.5 V
Lead Channel Setting Pacing Pulse Width: 0.5 ms
Lead Channel Setting Sensing Sensitivity: 1.2 mV
Zone Setting Status: 755011

## 2024-04-23 ENCOUNTER — Ambulatory Visit: Payer: Self-pay | Admitting: Internal Medicine

## 2024-04-24 NOTE — Progress Notes (Signed)
 Remote PPM Transmission

## 2024-05-01 ENCOUNTER — Encounter

## 2024-05-02 DIAGNOSIS — R300 Dysuria: Secondary | ICD-10-CM | POA: Diagnosis not present

## 2024-05-02 DIAGNOSIS — I10 Essential (primary) hypertension: Secondary | ICD-10-CM | POA: Diagnosis not present

## 2024-05-02 DIAGNOSIS — Z5189 Encounter for other specified aftercare: Secondary | ICD-10-CM | POA: Diagnosis not present

## 2024-05-02 DIAGNOSIS — I110 Hypertensive heart disease with heart failure: Secondary | ICD-10-CM | POA: Diagnosis not present

## 2024-05-02 DIAGNOSIS — E1165 Type 2 diabetes mellitus with hyperglycemia: Secondary | ICD-10-CM | POA: Diagnosis not present

## 2024-05-02 DIAGNOSIS — I509 Heart failure, unspecified: Secondary | ICD-10-CM | POA: Diagnosis not present

## 2024-05-15 ENCOUNTER — Telehealth: Payer: Self-pay | Admitting: Pharmacist

## 2024-05-15 ENCOUNTER — Encounter: Payer: Self-pay | Admitting: Pharmacist

## 2024-05-15 DIAGNOSIS — E782 Mixed hyperlipidemia: Secondary | ICD-10-CM

## 2024-05-15 NOTE — Progress Notes (Signed)
 Patient ID: Guy Roberson, male   DOB: 16-Nov-1931, 89 y.o.   MRN: [id number]   Pharmacy Quality Measure Review  This patient is appearing on a report for being at risk of failing the adherence measure for cholesterol (statin) medications this calendar year.   Medication: Simvastatin  20 mg Last fill date: 04/20/24 for  day supply  Will collaborate with provider to facilitate refill needs.   [name], PharmD, BCACP Clinical Pharmacist [phone]

## 2024-05-15 NOTE — Progress Notes (Unsigned)
 Patient ID: Guy Roberson, male   DOB: [birth year], 89 y.o.   MRN: [id number]  Patient appeared on the adherence report for Simvastatin  20 mg.  It was last filled 04/20/24 for a 30 day supply at [facility] but does not have refills.  Refills will be sent co-signature required for Simvastatin  20 mg to his PCP.   Guy Roberson, PharmD, BCACP Clinical Pharmacist [phone]

## 2024-05-16 ENCOUNTER — Encounter: Payer: Self-pay | Admitting: Pharmacist

## 2024-05-16 MED ORDER — SIMVASTATIN 20 MG PO TABS: 20.0000 mg | ORAL_TABLET | Freq: Every day | ORAL | 5 refills | Status: DC

## 2024-05-16 NOTE — Progress Notes (Signed)
 Error

## 2024-05-24 ENCOUNTER — Other Ambulatory Visit: Payer: Self-pay | Admitting: Adult Health

## 2024-06-01 ENCOUNTER — Encounter

## 2024-06-01 NOTE — Progress Notes (Deleted)
 "  Cardiology Office Note    Date:  06/01/2024  ID:  Guy, Roberson June 06, 1931, MRN [id number] Cardiologist: Previously only followed by Dr. Waddell Electrophysiologist:  Donnice DELENA Primus, MD { :  History of Present Illness:    Guy Roberson is a 89 y.o. male with past medical history of CHB (s/p Medtronic PPM placement in 11/2011), CAD (s/p CABG in 2011 with LIMA-LAD, SVG-D1, SVG-OM and SVG-PDA, cath in 2012 showing 4/4 patent grafts), seizures, HTN, HLD, BPH and Type 2 DM who presents to the office today for hospital follow-up.  He was last examined by myself in 03/2024 following a recent admission for acute on chronic renal failure in the setting of a UTI.  He had been at the [facility] but had experienced significant weight gain and worsening lower extremity edema.  Symptoms had improved since returning home and he was continued on Lasix  60 mg daily.  It was noted that his device was at [facility] and he was evaluated Dr. Primus at the time of his visit as well as it was felt that this could be contributing to his CHF.  He underwent successful GEN change out on 03/09/2024 with no immediate complications noted.  In the interim, he was admitted to [facility] from 10/28 - 04/07/2024 for an acute CHF exacerbation and repeat echocardiogram showed his EF was estimated 50% with no regional wall motion abnormalities.  RV function was not well-visualized and he was noted to have severely elevated PASP along with moderate MR, moderate to severe TR and mild AI. Also had acute encephalopathy during his admission and an AKI. Cardiac medications at the time of discharge included ASA 81 mg daily, Lasix  40 mg daily, Lopressor  25 mg twice daily and Simvastatin  20 mg daily. He was discharged to the [facility] he did miss his follow-up visit with the EP on 04/11/2024. Was discharged home from the [facility] on 04/20/2024.  ROS: ***  Studies Reviewed:   EKG: EKG is*** ordered today and demonstrates  ***   EKG Interpretation Date/Time:    Ventricular Rate:    PR Interval:    QRS Duration:    QT Interval:    QTC Calculation:   R Axis:      Text Interpretation:         Echocardiogram: 03/29/2024 IMPRESSIONS     1. Left ventricular ejection fraction, by estimation, is 50%. The left  ventricle has low normal function. The left ventricle has no regional wall  motion abnormalities. Left ventricular diastolic parameters are  indeterminate.   2. Right ventricular systolic function was not well visualized. The right  ventricular size is not well visualized. There is severely elevated  pulmonary artery systolic pressure.   3. The mitral valve is abnormal. Moderate mitral valve regurgitation.   4. Tricuspid valve regurgitation is moderate to severe.   5. The aortic valve is tricuspid. There is mild calcification of the  aortic valve. There is mild thickening of the aortic valve. Aortic valve  regurgitation is mild. No aortic stenosis is present.   6. The inferior vena cava is normal in size with greater than 50%  respiratory variability, suggesting right atrial pressure of 3 mmHg.    Risk Assessment/Calculations:   {Does this patient have ATRIAL FIBRILLATION?[phone]} No BP recorded.  {Refresh Note OR Click here to enter BP  :1}***         Physical Exam:   VS:  There were no vitals taken for this visit.  Wt Readings from Last 3 Encounters:  04/20/24 165 lb 12.8 oz (75.2 kg)  04/11/24 165 lb 12.8 oz (75.2 kg)  04/10/24 166 lb (75.3 kg)     GEN: Well nourished, well developed in no acute distress NECK: No JVD; No carotid bruits CARDIAC: ***RRR, no murmurs, rubs, gallops RESPIRATORY:  Clear to auscultation without rales, wheezing or rhonchi  ABDOMEN: Appears non-distended. No obvious abdominal masses. EXTREMITIES: No clubbing or cyanosis. No edema.  Distal pedal pulses are 2+ bilaterally.   Assessment and Plan:   1. Chronic heart failure with preserved ejection  fraction (HCC) ***  2. Coronary artery disease involving native coronary artery of native heart without angina pectoris ***  3. Complete heart block (HCC) ***  4. Valvular heart disease ***  5. Essential hypertension ***  6. Hyperlipidemia LDL goal <55 ***   Signed, Laymon CHRISTELLA Qua, PA-C   "

## 2024-06-02 ENCOUNTER — Ambulatory Visit: Admitting: Student

## 2024-06-02 DIAGNOSIS — I2510 Atherosclerotic heart disease of native coronary artery without angina pectoris: Secondary | ICD-10-CM

## 2024-06-02 DIAGNOSIS — I10 Essential (primary) hypertension: Secondary | ICD-10-CM

## 2024-06-02 DIAGNOSIS — E785 Hyperlipidemia, unspecified: Secondary | ICD-10-CM

## 2024-06-02 DIAGNOSIS — I442 Atrioventricular block, complete: Secondary | ICD-10-CM

## 2024-06-02 DIAGNOSIS — I5032 Chronic diastolic (congestive) heart failure: Secondary | ICD-10-CM

## 2024-06-02 DIAGNOSIS — I38 Endocarditis, valve unspecified: Secondary | ICD-10-CM

## 2024-06-03 ENCOUNTER — Emergency Department (HOSPITAL_COMMUNITY)

## 2024-06-03 ENCOUNTER — Inpatient Hospital Stay (HOSPITAL_COMMUNITY)
Admission: EM | Admit: 2024-06-03 | Discharge: 2024-06-16 | DRG: 291 | Disposition: A | Discharge status: Skilled Nursing Facility | Attending: Internal Medicine | Admitting: Internal Medicine

## 2024-06-03 ENCOUNTER — Other Ambulatory Visit: Payer: Self-pay

## 2024-06-03 ENCOUNTER — Encounter (HOSPITAL_COMMUNITY): Payer: Self-pay | Admitting: Emergency Medicine

## 2024-06-03 DIAGNOSIS — N390 Urinary tract infection, site not specified: Secondary | ICD-10-CM | POA: Diagnosis present

## 2024-06-03 DIAGNOSIS — R578 Other shock: Secondary | ICD-10-CM | POA: Diagnosis not present

## 2024-06-03 DIAGNOSIS — K219 Gastro-esophageal reflux disease without esophagitis: Secondary | ICD-10-CM | POA: Diagnosis present

## 2024-06-03 DIAGNOSIS — E1159 Type 2 diabetes mellitus with other circulatory complications: Secondary | ICD-10-CM | POA: Diagnosis present

## 2024-06-03 DIAGNOSIS — N3000 Acute cystitis without hematuria: Secondary | ICD-10-CM | POA: Diagnosis not present

## 2024-06-03 DIAGNOSIS — Z950 Presence of cardiac pacemaker: Secondary | ICD-10-CM | POA: Diagnosis not present

## 2024-06-03 DIAGNOSIS — N1831 Chronic kidney disease, stage 3a: Secondary | ICD-10-CM | POA: Diagnosis present

## 2024-06-03 DIAGNOSIS — Z79899 Other long term (current) drug therapy: Secondary | ICD-10-CM

## 2024-06-03 DIAGNOSIS — I5033 Acute on chronic diastolic (congestive) heart failure: Secondary | ICD-10-CM | POA: Diagnosis present

## 2024-06-03 DIAGNOSIS — Z720 Tobacco use: Secondary | ICD-10-CM

## 2024-06-03 DIAGNOSIS — I442 Atrioventricular block, complete: Secondary | ICD-10-CM | POA: Diagnosis present

## 2024-06-03 DIAGNOSIS — Z7189 Other specified counseling: Secondary | ICD-10-CM | POA: Diagnosis not present

## 2024-06-03 DIAGNOSIS — Z860100 Personal history of colon polyps, unspecified: Secondary | ICD-10-CM

## 2024-06-03 DIAGNOSIS — R54 Age-related physical debility: Secondary | ICD-10-CM | POA: Diagnosis present

## 2024-06-03 DIAGNOSIS — F05 Delirium due to known physiological condition: Secondary | ICD-10-CM | POA: Diagnosis present

## 2024-06-03 DIAGNOSIS — Z8249 Family history of ischemic heart disease and other diseases of the circulatory system: Secondary | ICD-10-CM

## 2024-06-03 DIAGNOSIS — R680 Hypothermia, not associated with low environmental temperature: Secondary | ICD-10-CM | POA: Diagnosis present

## 2024-06-03 DIAGNOSIS — Z880 Allergy status to penicillin: Secondary | ICD-10-CM

## 2024-06-03 DIAGNOSIS — I959 Hypotension, unspecified: Secondary | ICD-10-CM | POA: Diagnosis present

## 2024-06-03 DIAGNOSIS — E875 Hyperkalemia: Secondary | ICD-10-CM | POA: Diagnosis present

## 2024-06-03 DIAGNOSIS — N179 Acute kidney failure, unspecified: Secondary | ICD-10-CM | POA: Diagnosis not present

## 2024-06-03 DIAGNOSIS — I272 Pulmonary hypertension, unspecified: Secondary | ICD-10-CM | POA: Diagnosis present

## 2024-06-03 DIAGNOSIS — N1832 Chronic kidney disease, stage 3b: Secondary | ICD-10-CM | POA: Diagnosis present

## 2024-06-03 DIAGNOSIS — N400 Enlarged prostate without lower urinary tract symptoms: Secondary | ICD-10-CM | POA: Diagnosis present

## 2024-06-03 DIAGNOSIS — E876 Hypokalemia: Secondary | ICD-10-CM | POA: Diagnosis not present

## 2024-06-03 DIAGNOSIS — I10 Essential (primary) hypertension: Secondary | ICD-10-CM | POA: Diagnosis present

## 2024-06-03 DIAGNOSIS — D696 Thrombocytopenia, unspecified: Secondary | ICD-10-CM | POA: Diagnosis present

## 2024-06-03 DIAGNOSIS — I081 Rheumatic disorders of both mitral and tricuspid valves: Secondary | ICD-10-CM | POA: Diagnosis present

## 2024-06-03 DIAGNOSIS — E785 Hyperlipidemia, unspecified: Secondary | ICD-10-CM | POA: Diagnosis present

## 2024-06-03 DIAGNOSIS — J9811 Atelectasis: Secondary | ICD-10-CM | POA: Diagnosis present

## 2024-06-03 DIAGNOSIS — Z87440 Personal history of urinary (tract) infections: Secondary | ICD-10-CM

## 2024-06-03 DIAGNOSIS — I130 Hypertensive heart and chronic kidney disease with heart failure and stage 1 through stage 4 chronic kidney disease, or unspecified chronic kidney disease: Principal | ICD-10-CM | POA: Diagnosis present

## 2024-06-03 DIAGNOSIS — Z832 Family history of diseases of the blood and blood-forming organs and certain disorders involving the immune mechanism: Secondary | ICD-10-CM

## 2024-06-03 DIAGNOSIS — Z1152 Encounter for screening for COVID-19: Secondary | ICD-10-CM | POA: Diagnosis not present

## 2024-06-03 DIAGNOSIS — Z951 Presence of aortocoronary bypass graft: Secondary | ICD-10-CM

## 2024-06-03 DIAGNOSIS — Z888 Allergy status to other drugs, medicaments and biological substances status: Secondary | ICD-10-CM

## 2024-06-03 DIAGNOSIS — Z66 Do not resuscitate: Secondary | ICD-10-CM | POA: Diagnosis present

## 2024-06-03 DIAGNOSIS — Z801 Family history of malignant neoplasm of trachea, bronchus and lung: Secondary | ICD-10-CM

## 2024-06-03 DIAGNOSIS — I509 Heart failure, unspecified: Principal | ICD-10-CM

## 2024-06-03 DIAGNOSIS — G9341 Metabolic encephalopathy: Secondary | ICD-10-CM | POA: Diagnosis present

## 2024-06-03 DIAGNOSIS — I2510 Atherosclerotic heart disease of native coronary artery without angina pectoris: Secondary | ICD-10-CM | POA: Diagnosis present

## 2024-06-03 DIAGNOSIS — Z7982 Long term (current) use of aspirin: Secondary | ICD-10-CM | POA: Diagnosis not present

## 2024-06-03 DIAGNOSIS — E8729 Other acidosis: Secondary | ICD-10-CM | POA: Diagnosis present

## 2024-06-03 DIAGNOSIS — E1122 Type 2 diabetes mellitus with diabetic chronic kidney disease: Secondary | ICD-10-CM | POA: Diagnosis present

## 2024-06-03 DIAGNOSIS — Z515 Encounter for palliative care: Secondary | ICD-10-CM | POA: Diagnosis not present

## 2024-06-03 DIAGNOSIS — E11649 Type 2 diabetes mellitus with hypoglycemia without coma: Secondary | ICD-10-CM | POA: Diagnosis not present

## 2024-06-03 DIAGNOSIS — H9190 Unspecified hearing loss, unspecified ear: Secondary | ICD-10-CM | POA: Diagnosis present

## 2024-06-03 LAB — URINALYSIS, W/ REFLEX TO CULTURE (INFECTION SUSPECTED)
Bacteria, UA: NONE SEEN
Bilirubin Urine: NEGATIVE
Glucose, UA: NEGATIVE mg/dL
Hgb urine dipstick: NEGATIVE
Ketones, ur: NEGATIVE mg/dL
Nitrite: NEGATIVE
Protein, ur: NEGATIVE mg/dL
Specific Gravity, Urine: 1.01 (ref 1.005–1.030)
pH: 5 (ref 5.0–8.0)

## 2024-06-03 LAB — CBC WITH DIFFERENTIAL/PLATELET
Abs Immature Granulocytes: 0.02 K/uL (ref 0.00–0.07)
Basophils Absolute: 0 K/uL (ref 0.0–0.1)
Basophils Relative: 0 %
Eosinophils Absolute: 0 K/uL (ref 0.0–0.5)
Eosinophils Relative: 0 %
HCT: 33.4 % — ABNORMAL LOW (ref 39.0–52.0)
Hemoglobin: 10.6 g/dL — ABNORMAL LOW (ref 13.0–17.0)
Immature Granulocytes: 0 %
Lymphocytes Relative: 10 %
Lymphs Abs: 0.6 K/uL — ABNORMAL LOW (ref 0.7–4.0)
MCH: 29.3 pg (ref 26.0–34.0)
MCHC: 31.7 g/dL (ref 30.0–36.0)
MCV: 92.3 fL (ref 80.0–100.0)
Monocytes Absolute: 0.5 K/uL (ref 0.1–1.0)
Monocytes Relative: 9 %
Neutro Abs: 5 K/uL (ref 1.7–7.7)
Neutrophils Relative %: 81 %
Platelets: 76 K/uL — ABNORMAL LOW (ref 150–400)
RBC: 3.62 MIL/uL — ABNORMAL LOW (ref 4.22–5.81)
RDW: 16.4 % — ABNORMAL HIGH (ref 11.5–15.5)
WBC: 6.2 K/uL (ref 4.0–10.5)
nRBC: 0.3 % — ABNORMAL HIGH (ref 0.0–0.2)

## 2024-06-03 LAB — COMPREHENSIVE METABOLIC PANEL WITH GFR
ALT: 27 U/L (ref 0–44)
ALT: 27 U/L (ref 0–44)
AST: 28 U/L (ref 15–41)
AST: 27 U/L (ref 15–41)
Albumin: 3.9 g/dL (ref 3.5–5.0)
Albumin: 4 g/dL (ref 3.5–5.0)
Alkaline Phosphatase: 99 U/L (ref 38–126)
Alkaline Phosphatase: 103 U/L (ref 38–126)
Anion gap: 12 (ref 5–15)
Anion gap: 13 (ref 5–15)
BUN: 48 mg/dL — ABNORMAL HIGH (ref 8–23)
BUN: 46 mg/dL — ABNORMAL HIGH (ref 8–23)
CO2: 19 mmol/L — ABNORMAL LOW (ref 22–32)
CO2: 18 mmol/L — ABNORMAL LOW (ref 22–32)
Calcium: 9.6 mg/dL (ref 8.9–10.3)
Calcium: 9.9 mg/dL (ref 8.9–10.3)
Chloride: 102 mmol/L (ref 98–111)
Chloride: 103 mmol/L (ref 98–111)
Creatinine, Ser: 1.85 mg/dL — ABNORMAL HIGH (ref 0.61–1.24)
Creatinine, Ser: 1.81 mg/dL — ABNORMAL HIGH (ref 0.61–1.24)
GFR, Estimated: 34 mL/min — ABNORMAL LOW
GFR, Estimated: 35 mL/min — ABNORMAL LOW
Glucose, Bld: 108 mg/dL — ABNORMAL HIGH (ref 70–99)
Glucose, Bld: 110 mg/dL — ABNORMAL HIGH (ref 70–99)
Potassium: 6.1 mmol/L — ABNORMAL HIGH (ref 3.5–5.1)
Potassium: 5.8 mmol/L — ABNORMAL HIGH (ref 3.5–5.1)
Sodium: 133 mmol/L — ABNORMAL LOW (ref 135–145)
Sodium: 134 mmol/L — ABNORMAL LOW (ref 135–145)
Total Bilirubin: 0.3 mg/dL (ref 0.0–1.2)
Total Bilirubin: 0.3 mg/dL (ref 0.0–1.2)
Total Protein: 6.3 g/dL — ABNORMAL LOW (ref 6.5–8.1)
Total Protein: 6.4 g/dL — ABNORMAL LOW (ref 6.5–8.1)

## 2024-06-03 LAB — MAGNESIUM: Magnesium: 2.3 mg/dL (ref 1.7–2.4)

## 2024-06-03 LAB — BLOOD GAS, VENOUS
Acid-base deficit: 5.9 mmol/L — ABNORMAL HIGH (ref 0.0–2.0)
Bicarbonate: 20.2 mmol/L (ref 20.0–28.0)
Drawn by: 7049
O2 Saturation: 87.6 %
Patient temperature: 32.4
pCO2, Ven: 34 mmHg — ABNORMAL LOW (ref 44–60)
pH, Ven: 7.36 (ref 7.25–7.43)
pO2, Ven: 41 mmHg (ref 32–45)

## 2024-06-03 LAB — RESP PANEL BY RT-PCR (RSV, FLU A&B, COVID)  RVPGX2
Influenza A by PCR: NEGATIVE
Influenza B by PCR: NEGATIVE
Resp Syncytial Virus by PCR: NEGATIVE
SARS Coronavirus 2 by RT PCR: NEGATIVE

## 2024-06-03 LAB — LACTIC ACID, PLASMA: Lactic Acid, Venous: 1.2 mmol/L (ref 0.5–1.9)

## 2024-06-03 LAB — TROPONIN T, HIGH SENSITIVITY
Troponin T High Sensitivity: 27 ng/L — ABNORMAL HIGH (ref 0–19)
Troponin T High Sensitivity: 24 ng/L — ABNORMAL HIGH (ref 0–19)

## 2024-06-03 LAB — PRO BRAIN NATRIURETIC PEPTIDE: Pro Brain Natriuretic Peptide: 3722 pg/mL — ABNORMAL HIGH

## 2024-06-03 MED ORDER — SODIUM CHLORIDE 0.9 % IV SOLN
1.0000 g | Freq: Once | INTRAVENOUS | Status: AC
  Administered 2024-06-03: 1 g via INTRAVENOUS
  Filled 2024-06-03: qty 10

## 2024-06-03 MED ORDER — ONDANSETRON HCL 4 MG PO TABS: 4.0000 mg | ORAL_TABLET | Freq: Four times a day (QID) | ORAL | Status: DC | PRN

## 2024-06-03 MED ORDER — CALCIUM GLUCONATE-NACL 1-0.675 GM/50ML-% IV SOLN: 1.0000 g | Freq: Once | INTRAVENOUS | Status: DC

## 2024-06-03 MED ORDER — POLYETHYLENE GLYCOL 3350 17 G PO PACK: 17.0000 g | PACK | Freq: Every day | ORAL | Status: DC | PRN

## 2024-06-03 MED ORDER — CALCIUM GLUCONATE-NACL 1-0.675 GM/50ML-% IV SOLN
1.0000 g | Freq: Once | INTRAVENOUS | Status: AC
  Administered 2024-06-03: 1000 mg via INTRAVENOUS
  Filled 2024-06-03: qty 50

## 2024-06-03 MED ORDER — ONDANSETRON HCL 4 MG/2ML IJ SOLN: 4.0000 mg | Freq: Four times a day (QID) | INTRAMUSCULAR | Status: DC | PRN

## 2024-06-03 MED ORDER — FUROSEMIDE 10 MG/ML IJ SOLN
40.0000 mg | Freq: Two times a day (BID) | INTRAMUSCULAR | Status: DC
  Administered 2024-06-04 – 2024-06-09 (×11): 40 mg via INTRAVENOUS
  Filled 2024-06-03 (×11): qty 4

## 2024-06-03 MED ORDER — ACETAMINOPHEN 650 MG RE SUPP: 650.0000 mg | Freq: Four times a day (QID) | RECTAL | Status: DC | PRN

## 2024-06-03 MED ORDER — SODIUM CHLORIDE 0.9 % IV SOLN: 1.0000 g | INTRAVENOUS | Status: DC

## 2024-06-03 MED ORDER — SODIUM ZIRCONIUM CYCLOSILICATE 5 G PO PACK
5.0000 g | PACK | Freq: Once | ORAL | Status: AC
  Administered 2024-06-03: 5 g via ORAL
  Filled 2024-06-03: qty 1

## 2024-06-03 MED ORDER — ACETAMINOPHEN 325 MG PO TABS
650.0000 mg | ORAL_TABLET | Freq: Four times a day (QID) | ORAL | Status: DC | PRN
  Administered 2024-06-09 – 2024-06-15 (×2): 650 mg via ORAL
  Filled 2024-06-03 (×2): qty 2

## 2024-06-03 MED ORDER — TAMSULOSIN HCL 0.4 MG PO CAPS
0.4000 mg | ORAL_CAPSULE | Freq: Two times a day (BID) | ORAL | Status: DC
  Administered 2024-06-03 – 2024-06-16 (×21): 0.4 mg via ORAL
  Filled 2024-06-03 (×23): qty 1

## 2024-06-03 MED ORDER — INSULIN ASPART 100 UNIT/ML IV SOLN
5.0000 [IU] | Freq: Once | INTRAVENOUS | Status: AC
  Administered 2024-06-03: 5 [IU] via INTRAVENOUS
  Filled 2024-06-03: qty 1

## 2024-06-03 MED ORDER — SODIUM BICARBONATE 8.4 % IV SOLN
50.0000 meq | Freq: Once | INTRAVENOUS | Status: AC
  Administered 2024-06-03: 50 meq via INTRAVENOUS
  Filled 2024-06-03: qty 50

## 2024-06-03 MED ORDER — FUROSEMIDE 10 MG/ML IJ SOLN
80.0000 mg | Freq: Once | INTRAMUSCULAR | Status: AC
  Administered 2024-06-03: 80 mg via INTRAVENOUS
  Filled 2024-06-03: qty 8

## 2024-06-03 MED ORDER — DEXTROSE 50 % IV SOLN
1.0000 | Freq: Once | INTRAVENOUS | Status: AC
  Administered 2024-06-03: 50 mL via INTRAVENOUS
  Filled 2024-06-03: qty 50

## 2024-06-03 MED ORDER — ASPIRIN 81 MG PO TBEC
81.0000 mg | DELAYED_RELEASE_TABLET | Freq: Every day | ORAL | Status: DC
  Administered 2024-06-04 – 2024-06-14 (×9): 81 mg via ORAL
  Filled 2024-06-03 (×11): qty 1

## 2024-06-03 MED ORDER — CHLORHEXIDINE GLUCONATE CLOTH 2 % EX PADS
6.0000 | MEDICATED_PAD | Freq: Every day | CUTANEOUS | Status: DC
  Administered 2024-06-03 – 2024-06-15 (×12): 6 via TOPICAL

## 2024-06-03 NOTE — Progress Notes (Addendum)
" °   06/03/24 1953  Assess: MEWS Score  Temp (!) 90.5 F (32.5 C)  BP 101/69  MAP (mmHg) 81  Pulse Rate (!) 59  Resp 18  Level of Consciousness Alert  SpO2 97 %  O2 Device Room Air  Assess: MEWS Score  MEWS Temp 2  MEWS Systolic 0  MEWS Pulse 0  MEWS RR 0  MEWS LOC 0  MEWS Score 2  MEWS Score Color Yellow  Assess: if the MEWS score is Yellow or Red  Were vital signs accurate and taken at a resting state? Yes  Does the patient meet 2 or more of the SIRS criteria? Yes  Does the patient have a confirmed or suspected source of infection? No  MEWS guidelines implemented  Yes, yellow  Treat  MEWS Interventions Considered administering scheduled or prn medications/treatments as ordered  Take Vital Signs  Increase Vital Sign Frequency  Yellow: Q2hr x1, continue Q4hrs until patient remains green for 12hrs  Escalate  MEWS: Escalate Yellow: Discuss with charge nurse and consider notifying provider and/or RRT  Notify: Charge Nurse/RN  Name of Charge Nurse/RN Notified Metta Sar, RN  Provider Notification  Provider Name/Title Erminio Cone, NP  Date Provider Notified 06/03/24  Time Provider Notified 1955  Method of Notification  (Secure Chat)  Notification Reason Critical Result (Rectal Temp of 90.5)  Provider response See new orders  Date of Provider Response 06/03/24  Time of Provider Response 1957  Assess: SIRS CRITERIA  SIRS Temperature  1  SIRS Respirations  0  SIRS Pulse 0  SIRS WBC 0  SIRS Score Sum  1   Pt brought to 300 from ED. Upon assessment by charge nurse and myself, vitals above. NP Erminio Cone made aware, pt being transferred to ICU. "
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
## 2024-06-03 NOTE — Significant Event (Signed)
"  ° °      CROSS COVER NOTE  NAME: Guy Roberson MRN: [id number] DOB : 1932/01/05 ATTENDING PHYSICIAN: Pearlean Tully BRAVO, MD    Date of Service   06/03/2024   HPI/Events of Note   TRH cross cover at [facility] received from nurse patient hypothermic on arrival to [facility] unit with rectal temp 90.4  HPI patient with  history significant for CHF, CKD 3, hypertension, diabetes mellitus, complete heart block s/p pacemaker, chronic thrombocytopenia was admitted this afternoon with acute on chronic heart failure reporting an 8 lb weight gain with increased abdominal distension, and presumed UTI. He is on Rocephin . UA findings only positive for moderate amount of leukocytes, no bacteria (more consistent with chronic prostatitis). Culture is pending. Hyperkalemia present with initial labs and treated with  gm calcium  and lokelma  Lab review no leukocytosis, microcytic anemia at baseline hgb. Platelets 76 which appears to be lower than his norm.  Interventions   Assessment/Plan: Vitals:   06/03/24 2000 06/03/24 2100 06/03/24 2116 06/03/24 2155  BP:  110/60    Pulse:   60   Temp: (!) 90.4 F (32.4 C)   (!) 90.6 F (32.6 C)  Resp:  16 13   Height:   5' 7 (1.702 m)   Weight:   84.8 kg   SpO2:  97% 97%   TempSrc: Rectal Comment: Oral temp would not read/applying warming blanket  Rectal  BMI (Calculated):   29.27     GENERAL - mild shivers admits to having chills and feeling cold  GI - abdomen distended no pain RESP - unlabored sats stable on room air CV - Paced rhythm, nor chest pain; 2-3 + edema lower extremities, chronic PVD discoloration, pedal puses palpable and equal bilaterally   Transferred to stepdown [employer] rectal temps  X X        "

## 2024-06-03 NOTE — Progress Notes (Signed)
 Attempted to call ICU for report, no answer.

## 2024-06-03 NOTE — ED Provider Notes (Signed)
 " [place] EMERGENCY DEPARTMENT AT [facility] Provider Note   CSN: [id number] Arrival date & time: 06/03/24  1400     Patient presents with: Shortness of Breath   Guy Roberson is a 89 y.o. male.  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  Family noted that patient has gained approximately 8 pounds in the past week and has been increasing short of breath.  He is in a wheelchair at baseline but small movements in transferring have cause increased shortness of breath.  Patient states that when he lays flat the shortness of breath is worse.  He is on Lasix  40 mg daily and has caregivers that help give his medications every day.  Family also noted that patient was more confused today.  They stated that he was seeing people in the yard earlier today and asking for papers that were not there.  Family also noticed that urinary output has been decreased over the past few days but they have not noted any blood.  Notes he recently tested negative for a UTI.  Denies fevers, chest pain, abdominal pain, vomiting/diarrhea, constipation.  No further complaints.   HPI     Prior to Admission medications  Medication Sig Start Date End Date Taking? Authorizing Provider  acetaminophen  (TYLENOL ) 650 MG CR tablet Take by mouth every 8 (eight) hours as needed for pain.   Yes [provider]  albuterol  (VENTOLIN  HFA) 108 (90 Base) MCG/ACT inhaler Inhale 2 puffs into the lungs every 6 (six) hours as needed for wheezing or shortness of breath. 04/20/24  Yes Landy Barnie RAMAN, NP  aspirin  EC 81 MG tablet Take 1 tablet (81 mg total) by mouth daily. Swallow whole. 04/08/24  Yes Ricky Fines, MD  cephALEXin  (KEFLEX ) 250 MG capsule Take 1 capsule (250 mg total) by mouth at bedtime. 04/20/24  Yes Landy Barnie RAMAN, NP  ciprofloxacin  (CIPRO ) 250 MG tablet Take 250 mg by mouth every 12 (twelve) hours. X7 days starting 05/29/24 05/29/24  Yes [provider]  Cranberry 125 MG TABS Take 1 tablet by mouth daily.   Yes [provider]  furosemide  (LASIX ) 40 MG tablet Take 1 tablet (40 mg total) by mouth daily. Patient taking differently: Take 60 mg by mouth daily. 04/20/24  Yes Landy Barnie RAMAN, NP  lactobacillus acidophilus (BACID) TABS tablet Take 1 tablet by mouth daily.   Yes [provider]  lisinopril  (ZESTRIL ) 10 MG tablet Take 10 mg by mouth in the morning and at bedtime.   Yes [provider]  metoprolol  tartrate (LOPRESSOR ) 25 MG tablet Take 1 tablet (25 mg total) by mouth 2 (two) times daily. Patient taking differently: Take 50 mg by mouth 2 (two) times daily. 04/20/24  Yes Landy Barnie RAMAN, NP  pantoprazole  (PROTONIX ) 20 MG tablet Take 20 mg by mouth daily.   Yes [provider]  phenylephrine -shark liver oil-mineral oil-petrolatum  (PREPARATION H) 0.25-14-74.9 % rectal ointment Place 1 Application rectally 2 (two) times daily as needed for hemorrhoids. 04/07/24  Yes Ricky Fines, MD  polyethylene glycol (MIRALAX  / GLYCOLAX ) 17 g packet Take 17 g by mouth daily. 04/08/24  Yes Ricky Fines, MD  potassium chloride  (KLOR-CON ) 20 MEQ packet Take 20 mEq by mouth.   Yes [provider]  senna-docusate (SENOKOT-S) 8.6-50  MG tablet Take 1 tablet by mouth at bedtime as needed for mild constipation. 04/07/24  Yes Ricky Fines, MD  simvastatin  (ZOCOR ) 20 MG tablet Take 1 tablet (20 mg total) by mouth daily. 05/16/24  Yes Landy Barnie RAMAN, NP  tamsulosin  (FLOMAX ) 0.4 MG CAPS capsule Take 1 capsule (0.4 mg total) by mouth daily after supper. Patient taking differently: Take 0.4 mg by mouth in the morning and at bedtime. 04/20/24  Yes Landy Barnie RAMAN, NP  guaiFENesin -dextromethorphan  (ROBITUSSIN DM) 100-10 MG/5ML syrup Take 10 mLs by mouth every 4 (four) hours  as needed for cough. 02/02/24   Shahmehdi, Adriana LABOR, MD  SURE COMFORT PEN NEEDLES 31G X 5 MM MISC USE AS DIRECTED UP TO 3ITIMES DAILY. Patient not taking: Reported on 06/03/2024 04/29/22   [provider]    Allergies: Other and Penicillins    Review of Systems  Constitutional:  Negative for chills and fever.  Respiratory:  Positive for shortness of breath. Negative for cough.   Cardiovascular:  Negative for chest pain and leg swelling.  Gastrointestinal:  Negative for abdominal pain, constipation, diarrhea, nausea and vomiting.  Genitourinary:  Positive for difficulty urinating. Negative for dysuria and flank pain.  Neurological:  Negative for syncope and headaches.  All other systems reviewed and are negative.   Updated Vital Signs BP 111/72   Pulse 60   Temp 98 F (36.7 C) (Oral)   Resp (!) 27   Ht 5' 7 (1.702 m)   Wt 80.7 kg   SpO2 96%   BMI 27.88 kg/m   Physical Exam Constitutional:      Appearance: Normal appearance.  HENT:     Head: Normocephalic and atraumatic.     Nose: Nose normal.     Mouth/Throat:     Mouth: Mucous membranes are moist.     Pharynx: Oropharynx is clear.  Pulmonary:     Effort: Pulmonary effort is normal.     Breath sounds: Wheezing present.     Comments: Mild wheezing on the right upper and lower lobes.  No tachypnea or respiratory distress Abdominal:     General: Bowel sounds are normal.     Palpations: Abdomen is soft.     Tenderness: There is no abdominal tenderness.  Musculoskeletal:        General: Normal range of motion.  Skin:    General: Skin is warm and dry.  Neurological:     Mental Status: He is alert and oriented to person, place, and time.     Comments: Alert and oriented on exam  Psychiatric:        Mood and Affect: Mood normal.        Behavior: Behavior normal.     (all labs ordered are listed, but only abnormal results are displayed) Labs Reviewed  URINALYSIS, W/ REFLEX TO CULTURE (INFECTION SUSPECTED) -  Abnormal; Notable for the following components:      Result Value   APPearance HAZY (*)    Leukocytes,Ua LARGE (*)    All other components within normal limits  COMPREHENSIVE METABOLIC PANEL WITH GFR - Abnormal; Notable for the following components:   Sodium 133 (*)    Potassium 6.1 (*)    CO2 19 (*)    Glucose, Bld 108 (*)    BUN 48 (*)    Creatinine, Ser 1.85 (*)    Total Protein 6.3 (*)    GFR, Estimated 34 (*)    All other components within normal limits  CBC WITH DIFFERENTIAL/PLATELET -  Abnormal; Notable for the following components:   RBC 3.62 (*)    Hemoglobin 10.6 (*)    HCT 33.4 (*)    RDW 16.4 (*)    Platelets 76 (*)    nRBC 0.3 (*)    Lymphs Abs 0.6 (*)    All other components within normal limits  PRO BRAIN NATRIURETIC PEPTIDE - Abnormal; Notable for the following components:   Pro Brain Natriuretic Peptide 3,722.0 (*)    All other components within normal limits  TROPONIN T, HIGH SENSITIVITY - Abnormal; Notable for the following components:   Troponin T High Sensitivity 27 (*)    All other components within normal limits  TROPONIN T, HIGH SENSITIVITY - Abnormal; Notable for the following components:   Troponin T High Sensitivity 24 (*)    All other components within normal limits  RESP PANEL BY RT-PCR (RSV, FLU A&B, COVID)  RVPGX2  URINE CULTURE  LACTIC ACID, PLASMA    EKG: EKG Interpretation Date/Time:  Saturday June 03 2024 14:29:33 EST Ventricular Rate:  63 PR Interval:  216 QRS Duration:  229 QT Interval:  537 QTC Calculation: 550 R Axis:   -74  Text Interpretation: Sinus or ectopic atrial rhythm Borderline prolonged PR interval Left bundle branch block since last tracing no significant change Confirmed by Cleotilde Rogue ([id number]) on 06/03/2024 2:33:35 PM  Radiology: DG Chest 2 View Result Date: 06/03/2024 CLINICAL DATA:  Shortness of breath EXAM: CHEST - 2 VIEW COMPARISON:  April 01, 2024 FINDINGS: Stable cardiomegaly. Status post coronary bypass  graft. Left-sided pacemaker is unchanged. Elevated right hemidiaphragm is noted with mild right basilar atelectasis possible small right pleural effusion. Possible left basilar opacity suggesting atelectasis and/or effusion. Bony thorax is unremarkable. IMPRESSION: Bibasilar opacities as described above. Electronically Signed   By: Lynwood Landy Raddle M.D.   On: 06/03/2024 16:08   CT Head Wo Contrast Result Date: 06/03/2024 EXAM: CT HEAD WITHOUT CONTRAST 06/03/2024 03:07:00 PM TECHNIQUE: CT of the head was performed without the administration of intravenous contrast. Automated exposure control, iterative reconstruction, and/or weight based adjustment of the mA/kV was utilized to reduce the radiation dose to as low as reasonably achievable. COMPARISON: 03/28/2024 CLINICAL HISTORY: Mental status change, unknown cause FINDINGS: BRAIN AND VENTRICLES: No acute hemorrhage. No evidence of acute infarct. No hydrocephalus. No extra-axial collection. No mass effect or midline shift. Mild generalized atrophy and white matter changes are likely within normal limits for age. Atherosclerotic calcifications are present in the cavernous carotid arteries bilaterally and at the dural margin of both vertebral arteries. No hyperdense vessel is present. ORBITS: Bilateral lens replacements are noted. The globes and orbits are otherwise within normal limits. SINUSES: No acute abnormality. SOFT TISSUES AND SKULL: No acute soft tissue abnormality. No skull fracture. IMPRESSION: 1. No acute intracranial abnormality. Electronically signed by: Lonni Necessary MD 06/03/2024 03:23 PM EST RP Workstation: [id number]      Medications Ordered in the ED  calcium  gluconate 1 g/ 50 mL sodium chloride  IVPB (0 mg Intravenous Stopped 06/03/24 1635)  furosemide  (LASIX ) injection 80 mg (80 mg Intravenous Given 06/03/24 1526)  cefTRIAXone  (ROCEPHIN ) 1 g in sodium chloride  0.9 % 100 mL IVPB (0 g Intravenous Stopped 06/03/24 1738)    Clinical Course as  of 06/03/24 1832  Sat Jun 03, 2024  1506 Creatinine(!): 1.85 Slightly worse than baseline of 1.4 [AY]  1509 Hemoglobin(!): 10.6 Stable compared to baseline [AY]    Clinical Course User Index [AY] Neysa Thersia RAMAN, PA-C  Medical Decision Making Patient is a 89 year old male with a history of CHF on Lasix  and pacemaker in place who presents to the ED via EMS from home for increasing shortness of breath, weight gain, and leg swelling for the past week.  Family also notes increased confusion earlier today.  Please see detailed HPI above.  On exam patient is alert and in no acute distress.  Physical exam as noted above.  Vital signs are stable including oxygen on room air.  Mild tachypnea noted and mild wheezing on the right but no acute respiratory distress.  Differential includes CHF exacerbation, PE, ACS, viral illness.  Lab workup significant for proBNP around 3700.  Initial troponin 27, second 24.  Mild AKI with a creatinine of 1.85 that is only slightly worse than his baseline of 1.4.  Will withhold fluids at this time due to concerns for fluid overload.  Hyperkalemia with a potassium of 6.1 noted, a dose of calcium  gluconate was given.  No acute EKG changes appreciated.  He is also noted to have a mild UTI on UA.  Rocephin  given.  Chest x-ray reviewed that shows mild bilateral pleural effusions.  CT of the head unremarkable.  Suspect patient's symptoms are secondary to worsening CHF exacerbation.  He was given 80 mg of Lasix .  This is very similar to patient's presentation when he was admitted back at the end of October 2025.  Due to patient's age with worsening confusion and CHF fluid, we do feel admission for further evaluation and management would be beneficial.  Hospitalist has been consulted and are agreeable to admit patient for further evaluation.  Patient stable while in ED.  Amount and/or Complexity of Data Reviewed Labs: ordered. Decision-making details  documented in ED Course. Radiology: ordered.  Risk Prescription drug management. Decision regarding hospitalization.       Final diagnoses:  Acute on chronic congestive heart failure, unspecified heart failure type [facility])  Urinary tract infection without hematuria, site unspecified    ED Discharge Orders     None          Neysa Thersia RAMAN, [place] 06/03/24 1832  "

## 2024-06-03 NOTE — ED Notes (Signed)
 Asked patient for urine sample, patient said that they could try in a little bit.

## 2024-06-03 NOTE — ED Triage Notes (Signed)
 Per ems pt coming from home- family called out stating patient has been slightly confused. A&0 for ems en route. Patient has put 8 lbs of fluid in the past week. Having dyspnea with exertion. Patient hard of hearing.

## 2024-06-04 ENCOUNTER — Inpatient Hospital Stay (HOSPITAL_COMMUNITY)

## 2024-06-04 DIAGNOSIS — N1831 Chronic kidney disease, stage 3a: Secondary | ICD-10-CM | POA: Diagnosis not present

## 2024-06-04 DIAGNOSIS — I5033 Acute on chronic diastolic (congestive) heart failure: Secondary | ICD-10-CM

## 2024-06-04 DIAGNOSIS — N3000 Acute cystitis without hematuria: Secondary | ICD-10-CM

## 2024-06-04 DIAGNOSIS — D696 Thrombocytopenia, unspecified: Secondary | ICD-10-CM | POA: Diagnosis not present

## 2024-06-04 DIAGNOSIS — E1159 Type 2 diabetes mellitus with other circulatory complications: Secondary | ICD-10-CM

## 2024-06-04 DIAGNOSIS — N1832 Chronic kidney disease, stage 3b: Secondary | ICD-10-CM | POA: Diagnosis not present

## 2024-06-04 DIAGNOSIS — I442 Atrioventricular block, complete: Secondary | ICD-10-CM

## 2024-06-04 DIAGNOSIS — G9341 Metabolic encephalopathy: Secondary | ICD-10-CM

## 2024-06-04 DIAGNOSIS — I10 Essential (primary) hypertension: Secondary | ICD-10-CM

## 2024-06-04 LAB — BASIC METABOLIC PANEL WITH GFR
Anion gap: 9 (ref 5–15)
BUN: 48 mg/dL — ABNORMAL HIGH (ref 8–23)
CO2: 24 mmol/L (ref 22–32)
Calcium: 9.4 mg/dL (ref 8.9–10.3)
Chloride: 102 mmol/L (ref 98–111)
Creatinine, Ser: 1.91 mg/dL — ABNORMAL HIGH (ref 0.61–1.24)
GFR, Estimated: 33 mL/min — ABNORMAL LOW
Glucose, Bld: 101 mg/dL — ABNORMAL HIGH (ref 70–99)
Potassium: 5.4 mmol/L — ABNORMAL HIGH (ref 3.5–5.1)
Sodium: 135 mmol/L (ref 135–145)

## 2024-06-04 LAB — CBC WITH DIFFERENTIAL/PLATELET
Abs Immature Granulocytes: 0.04 K/uL (ref 0.00–0.07)
Basophils Absolute: 0 K/uL (ref 0.0–0.1)
Basophils Relative: 0 %
Eosinophils Absolute: 0 K/uL (ref 0.0–0.5)
Eosinophils Relative: 0 %
HCT: 35.5 % — ABNORMAL LOW (ref 39.0–52.0)
Hemoglobin: 10.9 g/dL — ABNORMAL LOW (ref 13.0–17.0)
Immature Granulocytes: 1 %
Lymphocytes Relative: 12 %
Lymphs Abs: 0.7 K/uL (ref 0.7–4.0)
MCH: 28.6 pg (ref 26.0–34.0)
MCHC: 30.7 g/dL (ref 30.0–36.0)
MCV: 93.2 fL (ref 80.0–100.0)
Monocytes Absolute: 0.5 K/uL (ref 0.1–1.0)
Monocytes Relative: 8 %
Neutro Abs: 4.9 K/uL (ref 1.7–7.7)
Neutrophils Relative %: 79 %
Platelets: 78 K/uL — ABNORMAL LOW (ref 150–400)
RBC: 3.81 MIL/uL — ABNORMAL LOW (ref 4.22–5.81)
RDW: 16.5 % — ABNORMAL HIGH (ref 11.5–15.5)
WBC: 6.2 K/uL (ref 4.0–10.5)
nRBC: 0 % (ref 0.0–0.2)

## 2024-06-04 LAB — GLUCOSE, CAPILLARY
Glucose-Capillary: 102 mg/dL — ABNORMAL HIGH (ref 70–99)
Glucose-Capillary: 107 mg/dL — ABNORMAL HIGH (ref 70–99)
Glucose-Capillary: 113 mg/dL — ABNORMAL HIGH (ref 70–99)
Glucose-Capillary: 126 mg/dL — ABNORMAL HIGH (ref 70–99)
Glucose-Capillary: 151 mg/dL — ABNORMAL HIGH (ref 70–99)
Glucose-Capillary: 41 mg/dL — CL (ref 70–99)
Glucose-Capillary: 83 mg/dL (ref 70–99)

## 2024-06-04 LAB — T4, FREE: Free T4: 1.81 ng/dL (ref 0.80–2.00)

## 2024-06-04 LAB — LACTIC ACID, PLASMA
Lactic Acid, Venous: 1.3 mmol/L (ref 0.5–1.9)
Lactic Acid, Venous: 1.3 mmol/L (ref 0.5–1.9)

## 2024-06-04 LAB — TSH: TSH: 0.413 u[IU]/mL (ref 0.350–4.500)

## 2024-06-04 LAB — CORTISOL-AM, BLOOD: Cortisol - AM: 15.8 ug/dL (ref 6.7–22.6)

## 2024-06-04 LAB — MRSA NEXT GEN BY PCR, NASAL: MRSA by PCR Next Gen: NOT DETECTED

## 2024-06-04 MED ORDER — NOREPINEPHRINE 4 MG/250ML-% IV SOLN
0.0000 ug/min | INTRAVENOUS | Status: DC
  Administered 2024-06-04: 2 ug/min via INTRAVENOUS
  Filled 2024-06-04: qty 250

## 2024-06-04 MED ORDER — SODIUM CHLORIDE 0.9 % IV SOLN
2.0000 g | INTRAVENOUS | Status: DC
  Administered 2024-06-04 – 2024-06-06 (×3): 2 g via INTRAVENOUS
  Filled 2024-06-04 (×3): qty 20

## 2024-06-04 MED ORDER — HYDROCORTISONE SOD SUC (PF) 100 MG IJ SOLR
100.0000 mg | Freq: Two times a day (BID) | INTRAMUSCULAR | Status: DC
  Administered 2024-06-04 – 2024-06-11 (×15): 100 mg via INTRAVENOUS
  Filled 2024-06-04 (×15): qty 2

## 2024-06-04 MED ORDER — DEXTROSE 50 % IV SOLN: 1.0000 | Freq: Once | INTRAVENOUS | Status: AC

## 2024-06-04 MED ORDER — DEXTROSE 10 % IV SOLN: INTRAVENOUS | Status: DC

## 2024-06-04 MED ORDER — ORAL CARE MOUTH RINSE: 15.0000 mL | OROMUCOSAL | Status: DC | PRN

## 2024-06-04 MED ORDER — SODIUM CHLORIDE 0.9 % IV SOLN: 250.0000 mL | INTRAVENOUS | Status: AC

## 2024-06-04 MED ORDER — DEXTROSE 50 % IV SOLN
INTRAVENOUS | Status: AC
  Administered 2024-06-04: 50 mL
  Filled 2024-06-04: qty 50

## 2024-06-04 MED ORDER — SODIUM ZIRCONIUM CYCLOSILICATE 10 G PO PACK
10.0000 g | PACK | Freq: Two times a day (BID) | ORAL | Status: AC
  Administered 2024-06-04 (×2): 10 g via ORAL
  Filled 2024-06-04 (×2): qty 1

## 2024-06-04 NOTE — Plan of Care (Signed)
  Problem: Education: Goal: Knowledge of General Education information will improve Description: Including pain rating scale, medication(s)/side effects and non-pharmacologic comfort measures Outcome: Progressing   Problem: Coping: Goal: Level of anxiety will decrease Outcome: Progressing   Problem: Pain Managment: Goal: General experience of comfort will improve and/or be controlled Outcome: Progressing

## 2024-06-04 NOTE — Significant Event (Addendum)
"  ° °      CROSS COVER NOTE  NAME: Rodman Recupero MRN: [id number] DOB : [birth year] ATTENDING PHYSICIAN: Pearlean Tully BRAVO, MD    Date of Service   06/04/2024   HPI/Events of Note   TRH cross cover at [facility] Follow up visit to bedside at 0310 due to low blood pressure /recorded in EPI at 0300 57/33 (40)  Interventions   Assessment/Plan: Bedside blood pressure rechecked 63/45 with MAP in 50's Remains hypotherimc 94.8 on bear hugger Not as awake when aroused compared to earlier assessment Oxygen sats at 88% on room air without resp distress or increased work of breathing. Denies pain. Rhythmoon monitor remains paced at 60 CBG 41  Oxygen humidify - keep sats above 92 1 amp d50 ordered and given, D10 continuous infusion initiated Peripheral pressors - levophed  started Cbg every 2 h until stable then every 4 Lactic acid trend and blod cultures ordered Bladder scan - over 400, foley catheter inserted - infection prevention measures in place Rocephin  increased to 2 gm every 24 h Level of care upgraded to ICU   Neuro status back to baseline after Dextrose  Foley placement by nursing some painful for patient but patent and draining, clear yellow, no hematuria  CRITICAL CARE Performed by: Erminio Cone   Total critical care time: 60 minutes  Critical care time was exclusive of separately billable procedures and treating other patients.  Critical care was necessary to treat or prevent imminent or life-threatening deterioration.  Critical care was time spent personally by me on the following activities: development of treatment plan with patient and/or surrogate as well as nursing, discussions with consultants, evaluation of patient's response to treatment, examination of patient, obtaining history from patient or surrogate, ordering and performing treatments and interventions, ordering and review of laboratory studies, ordering and review of radiographic studies, pulse  oximetry and re-evaluation of patient's condition.        Erminio LITTIE Cone NP [facility] Cross Cover 7pm-7am - check amion for availability Pager [phone]   UPDATE  Solucortef 50 mg iv every 6 hours ordered for suspected adrenal insufficiency TSH result note and free T4 added to blood in lab Worsening creatinine with persistant hyperkalemia? Hypotensive episode was of minimal duration - renal ultrasound ordered "

## 2024-06-04 NOTE — Progress Notes (Signed)
 " PROGRESS NOTE    Guy Roberson  FMW:[id number] DOB: 1932/02/09 DOA: 06/03/2024  PCP: [name], MD    Chief Complaint  Patient presents with   Shortness of Breath    Brief admission narrative:  As per H&P written by Dr. Pearlean on 06/04/2023 Guy Roberson is a 89 y.o. male with medical history significant for CHF, CKD, hypertension, diabetes mellitus, complete heart block pacemaker status. Patient presented to the ED with complaints of weight gain of 8 pounds over the past 1 week, with abdominal distention.  Bilateral lower extremity edema is chronic and at baseline.  Son also reports that patient's speech was confused earlier today-saying things that did not make sense.  He has been compliant with Lasix  40-60 mg daily.   Hospitalized 10/28 to 11/7 also for CHF, diuresed with IV Lasix .  proBNP 3949.   ED Course: O2 sats greater than 95% on room air.  Heart rate 50s to 60s.  Respiratory rate 13-28.  Temperature 98.  Blood pressure systolic 92-117. UA with large leukocytes. Creatinine 1.85. Platelets 76. Chest x-ray shows bibasilar opacities-atelectasis and/or effusion. Head CT negative for acute abnormality. IV Lasix  80 mg x 1 given.    Assessment & Plan:  Acute on chronic diastolic CHF (congestive heart failure) (HCC) -Continue IV diuresis as blood pressure will allow - Follow daily weights/strict intake and output -Follow clinical response -Currently diuresis limited secondary to low blood pressure.  Acute metabolic encephalopathy -In the setting of UTI most likely - Continue constant orientation - Continue IV antibiotics and supportive care.  Urinary tract infection without hematuria Patient with recurrent UTIs - Suppression antibiotic given at baseline -Follow culture result - Continue IV antibiotics - Actiq  acid stable.  Type 2 diabetes mellitus with other circulatory complications (HCC) -Experiencing hypoglycemia - Continue to follow CBG fluctuation. -  Cortisol within normal limits.  Essential hypertension -Currently hypotensive and requiring pressure support - Continue to follow vital signs - Wean off Levophed  as tolerated.  Complete heart block (HCC) -Status post pacemaker implantation - Continue telemetry monitoring - Follow electrolytes stability and follow  CKD stage 3B baseline GFR results. -Appears to be stable - Continue to follow renal function trend - Minimize nephrotoxic agents  Thrombocytopenia - Probably associated with acute infection - No overt bleeding appreciated on exam - Follow platelet counts trend minimizing use of heparin  products.  Social/ethics - Patient DNR/DNI -Overall condition/prognosis guarded  DVT prophylaxis: SCDs Code Status: DNR/DNI. Family Communication: No family at bedside. Disposition:   Status is: Inpatient Remains inpatient appropriate because: Continue IV therapy.   Consultants:  None  Procedures:  See below for x-ray reports.  Antimicrobials:  Ceftriaxone    Subjective: Overnight demonstrating worsening confusion 8/3 leading and requiring mittens for safety.  Patient with complaining of hypothermia, hypoglycemia and ongoing hypotension requiring peripheral pressor support.  Objective: Vitals:   06/04/24 0800 06/04/24 0900 06/04/24 1000 06/04/24 1500  BP: 111/67 (!) 106/40 (!) 111/42 (!) 116/97  Pulse: (!) 58 (!) 58 62 60  Resp: 16 17 (!) 21 (!) 22  Temp:    (!) 97.5 F (36.4 C)  TempSrc:      SpO2: 98% 96% 97% 99%  Weight:      Height:        Intake/Output Summary (Last 24 hours) at 06/04/2024 1717 Last data filed at 06/04/2024 0713 Gross per 24 hour  Intake 116.09 ml  Output 650 ml  Net -533.91 ml   Filed Weights   06/03/24 2116  06/04/24 0421 06/04/24 0715  Weight: 84.8 kg 82.6 kg 66.3 kg    Examination:  General exam: Experiencing intermittent hypothermia, confused/delirious and requiring mittens for safety. Respiratory system: Decreased breath sounds  at the bases and mild upper airway wheezing appreciated on exam.  No using accessory muscle. Cardiovascular system: Rate controlled, no rubs, no gallops, no JVD. Gastrointestinal system: Abdomen is mildly distended, no guarding, positive bowel sounds. Central nervous system: Moving 4 limbs spontaneously.  Limited examination secondary to encephalopathy. Extremities: Cyanosis or clubbing; 1-2+ edema appreciated bilaterally. Skin: No petechiae. Psychiatry: Judgement and insight impaired currently secondary to encephalopathic process.    Data Reviewed: I have personally reviewed following labs and imaging studies  CBC: Recent Labs  Lab 06/03/24 1416 06/03/24 2110  WBC 6.2 6.2  NEUTROABS 5.0 4.9  HGB 10.6* 10.9*  HCT 33.4* 35.5*  MCV 92.3 93.2  PLT 76* 78*    Basic Metabolic Panel: Recent Labs  Lab 06/03/24 1416 06/03/24 2110 06/04/24 0443  NA 133* 134* 135  K 6.1* 5.8* 5.4*  CL 102 103 102  CO2 19* 18* 24  GLUCOSE 108* 110* 101*  BUN 48* 46* 48*  CREATININE 1.85* 1.81* 1.91*  CALCIUM  9.6 9.9 9.4  MG  --  2.3  --     GFR: Estimated Creatinine Clearance: 22.4 mL/min (A) (by C-G formula based on SCr of 1.91 mg/dL (H)).  Liver Function Tests: Recent Labs  Lab 06/03/24 1416 06/03/24 2110  AST 28 27  ALT 27 27  ALKPHOS 99 103  BILITOT 0.3 0.3  PROT 6.3* 6.4*  ALBUMIN 3.9 4.0    CBG: Recent Labs  Lab 06/04/24 0328 06/04/24 0455 06/04/24 0748 06/04/24 1121 06/04/24 1652  GLUCAP 41* 102* 83 107* 113*    Recent Results (from the past 240 hours)  Resp panel by RT-PCR (RSV, Flu A&B, Covid) Anterior Nasal Swab     Status: None   Collection Time: 06/03/24  2:28 PM   Specimen: Anterior Nasal Swab  Result Value Ref Range Status   SARS Coronavirus 2 by RT PCR NEGATIVE NEGATIVE Final    Comment: (NOTE) SARS-CoV-2 target nucleic acids are NOT DETECTED.  The SARS-CoV-2 RNA is generally detectable in upper respiratory specimens during the acute phase of  infection. The lowest concentration of SARS-CoV-2 viral copies this assay can detect is 138 copies/mL. A negative result does not preclude SARS-Cov-2 infection and should not be used as the sole basis for treatment or other patient management decisions. A negative result may occur with  improper specimen collection/handling, submission of specimen other than nasopharyngeal swab, presence of viral mutation(s) within the areas targeted by this assay, and inadequate number of viral copies(<138 copies/mL). A negative result must be combined with clinical observations, patient history, and epidemiological information. The expected result is Negative.  Fact Sheet for Patients:  bloggercourse.com  Fact Sheet for Healthcare Providers:  seriousbroker.it  This test is no t yet approved or cleared by the [place]  FDA and  has been authorized for detection and/or diagnosis of SARS-CoV-2 by FDA under an Emergency Use Authorization (EUA). This EUA will remain  in effect (meaning this test can be used) for the duration of the COVID-19 declaration under Section 564(b)(1) of the Act, 21 U.S.C.section 360bbb-3(b)(1), unless the authorization is terminated  or revoked sooner.       Influenza A by PCR NEGATIVE NEGATIVE Final   Influenza B by PCR NEGATIVE NEGATIVE Final    Comment: (NOTE) The Xpert Xpress SARS-CoV-2/FLU/RSV plus assay  is intended as an aid in the diagnosis of influenza from Nasopharyngeal swab specimens and should not be used as a sole basis for treatment. Nasal washings and aspirates are unacceptable for Xpert Xpress SARS-CoV-2/FLU/RSV testing.  Fact Sheet for Patients: bloggercourse.com  Fact Sheet for Healthcare Providers: seriousbroker.it  This test is not yet approved or cleared by the [place]  FDA and has been authorized for detection and/or diagnosis of SARS-CoV-2  by FDA under an Emergency Use Authorization (EUA). This EUA will remain in effect (meaning this test can be used) for the duration of the COVID-19 declaration under Section 564(b)(1) of the Act, 21 U.S.C. section 360bbb-3(b)(1), unless the authorization is terminated or revoked.     Resp Syncytial Virus by PCR NEGATIVE NEGATIVE Final    Comment: (NOTE) Fact Sheet for Patients: bloggercourse.com  Fact Sheet for Healthcare Providers: seriousbroker.it  This test is not yet approved or cleared by the [place]  FDA and has been authorized for detection and/or diagnosis of SARS-CoV-2 by FDA under an Emergency Use Authorization (EUA). This EUA will remain in effect (meaning this test can be used) for the duration of the COVID-19 declaration under Section 564(b)(1) of the Act, 21 U.S.C. section 360bbb-3(b)(1), unless the authorization is terminated or revoked.  Performed at [facility], [address] [place], [place] [address]   MRSA Next Gen by PCR, Nasal     Status: None   Collection Time: 06/03/24  9:00 PM   Specimen: Nasal Mucosa; Nasal Swab  Result Value Ref Range Status   MRSA by PCR Next Gen NOT DETECTED NOT DETECTED Final    Comment: (NOTE) The GeneXpert MRSA Assay (FDA approved for NASAL specimens only), is one component of a comprehensive MRSA colonization surveillance program. It is not intended to diagnose MRSA infection nor to guide or monitor treatment for MRSA infections. Test performance is not FDA approved in patients less than 16 years old. Performed at [facility], [address], [place], [place] [address]   Culture, blood (Routine X 2) w Reflex to ID Panel     Status: None (Preliminary result)   Collection Time: 06/03/24  9:10 PM   Specimen: BLOOD LEFT HAND  Result Value Ref Range Status   Specimen Description BLOOD LEFT HAND  Final   Special Requests   Final    BOTTLES DRAWN AEROBIC AND ANAEROBIC Blood  Culture adequate volume   Culture   Final    NO GROWTH < 12 HOURS Performed at [facility], [address], [place], [place] [address]    Report Status PENDING  Incomplete  Culture, blood (Routine X 2) w Reflex to ID Panel     Status: None (Preliminary result)   Collection Time: 06/04/24  4:43 AM   Specimen: BLOOD LEFT HAND  Result Value Ref Range Status   Specimen Description BLOOD LEFT HAND  Final   Special Requests   Final    BOTTLES DRAWN AEROBIC AND ANAEROBIC Blood Culture adequate volume   Culture   Final    NO GROWTH < 12 HOURS Performed at [facility], [address], [place], [place] [address]    Report Status PENDING  Incomplete    Radiology Studies: US  RENAL Result Date: 06/04/2024 CLINICAL DATA:  Acute renal failure EXAM: RENAL / URINARY TRACT ULTRASOUND COMPLETE COMPARISON:  March 28, 2024 FINDINGS: Right Kidney: Renal measurements: 9.5 x 5.8 x 5.8 cm = volume: 168 mL. Minimally increased echogenicity of renal parenchyma is noted suggesting medical renal disease. No mass or  hydronephrosis visualized. Left Kidney: Renal measurements: 11.2 x 5.6 x 5.6 cm = volume: 185 mL. Probable mild hydronephrosis is noted. Minimally increased echogenicity is noted suggesting medical renal disease. 1.5 cm exophytic simple cyst is noted. Bladder: Decompressed secondary to Foley catheter. Other: Minimal ascites is noted. IMPRESSION: 1. Probable mild left hydronephrosis is noted. 2. Minimally increased echogenicity of renal parenchyma is noted bilaterally suggesting medical renal disease. 3. Minimal ascites. Electronically Signed   By: Lynwood Landy Raddle M.D.   On: 06/04/2024 10:53   DG Chest 2 View Result Date: 06/03/2024 CLINICAL DATA:  Shortness of breath EXAM: CHEST - 2 VIEW COMPARISON:  April 01, 2024 FINDINGS: Stable cardiomegaly. Status post coronary bypass graft. Left-sided pacemaker is unchanged. Elevated right hemidiaphragm is noted with mild right basilar atelectasis possible small right  pleural effusion. Possible left basilar opacity suggesting atelectasis and/or effusion. Bony thorax is unremarkable. IMPRESSION: Bibasilar opacities as described above. Electronically Signed   By: Lynwood Landy Raddle M.D.   On: 06/03/2024 16:08   CT Head Wo Contrast Result Date: 06/03/2024 EXAM: CT HEAD WITHOUT CONTRAST 06/03/2024 03:07:00 PM TECHNIQUE: CT of the head was performed without the administration of intravenous contrast. Automated exposure control, iterative reconstruction, and/or weight based adjustment of the mA/kV was utilized to reduce the radiation dose to as low as reasonably achievable. COMPARISON: 03/28/2024 CLINICAL HISTORY: Mental status change, unknown cause FINDINGS: BRAIN AND VENTRICLES: No acute hemorrhage. No evidence of acute infarct. No hydrocephalus. No extra-axial collection. No mass effect or midline shift. Mild generalized atrophy and white matter changes are likely within normal limits for age. Atherosclerotic calcifications are present in the cavernous carotid arteries bilaterally and at the dural margin of both vertebral arteries. No hyperdense vessel is present. ORBITS: Bilateral lens replacements are noted. The globes and orbits are otherwise within normal limits. SINUSES: No acute abnormality. SOFT TISSUES AND SKULL: No acute soft tissue abnormality. No skull fracture. IMPRESSION: 1. No acute intracranial abnormality. Electronically signed by: Lonni Necessary MD 06/03/2024 03:23 PM EST RP Workstation: [id number]    Scheduled Meds:  aspirin  EC  81 mg Oral Daily   Chlorhexidine  Gluconate Cloth  6 each Topical Daily   furosemide   40 mg Intravenous BID   hydrocortisone  sod succinate (SOLU-CORTEF ) inj  100 mg Intravenous Q12H   sodium zirconium cyclosilicate   10 g Oral BID   tamsulosin   0.4 mg Oral BID   Continuous Infusions:  sodium chloride      cefTRIAXone  (ROCEPHIN )  IV 2 g (06/04/24 1657)   dextrose  30 mL/hr at 06/04/24 0713   norepinephrine  (LEVOPHED ) Adult  infusion 2 mcg/min (06/04/24 0713)     LOS: 1 day    CRITICAL CARE Performed by: Eric Nunnery   Total critical care time: 55 minutes  Critical care time was exclusive of separately billable procedures and treating other patients.  Critical care was necessary to treat or prevent imminent or life-threatening deterioration.  Critical care was time spent personally by me on the following activities: development of treatment plan with patient and/or surrogate as well as nursing, discussions with consultants, evaluation of patient's response to treatment, examination of patient, obtaining history from patient or surrogate, ordering and performing treatments and interventions, ordering and review of laboratory studies, ordering and review of radiographic studies, pulse oximetry and re-evaluation of patient's condition.     Eric Nunnery, MD [facility]   To contact the attending provider between 7A-7P or the covering provider during after hours 7P-7A, please log into the web site [email or web address]  and access using universal  password for that web site. If you do not have the password, please call the hospital operator.  06/04/2024, 5:17 PM    "

## 2024-06-05 DIAGNOSIS — E1159 Type 2 diabetes mellitus with other circulatory complications: Secondary | ICD-10-CM | POA: Diagnosis not present

## 2024-06-05 DIAGNOSIS — I10 Essential (primary) hypertension: Secondary | ICD-10-CM | POA: Diagnosis not present

## 2024-06-05 DIAGNOSIS — N1831 Chronic kidney disease, stage 3a: Secondary | ICD-10-CM

## 2024-06-05 DIAGNOSIS — I5033 Acute on chronic diastolic (congestive) heart failure: Secondary | ICD-10-CM | POA: Diagnosis not present

## 2024-06-05 DIAGNOSIS — D696 Thrombocytopenia, unspecified: Secondary | ICD-10-CM | POA: Diagnosis not present

## 2024-06-05 LAB — GLUCOSE, CAPILLARY
Glucose-Capillary: 101 mg/dL — ABNORMAL HIGH (ref 70–99)
Glucose-Capillary: 107 mg/dL — ABNORMAL HIGH (ref 70–99)
Glucose-Capillary: 109 mg/dL — ABNORMAL HIGH (ref 70–99)
Glucose-Capillary: 112 mg/dL — ABNORMAL HIGH (ref 70–99)
Glucose-Capillary: 141 mg/dL — ABNORMAL HIGH (ref 70–99)
Glucose-Capillary: 144 mg/dL — ABNORMAL HIGH (ref 70–99)
Glucose-Capillary: 96 mg/dL (ref 70–99)

## 2024-06-05 LAB — BASIC METABOLIC PANEL WITH GFR
Anion gap: 12 (ref 5–15)
BUN: 48 mg/dL — ABNORMAL HIGH (ref 8–23)
CO2: 25 mmol/L (ref 22–32)
Calcium: 9.4 mg/dL (ref 8.9–10.3)
Chloride: 101 mmol/L (ref 98–111)
Creatinine, Ser: 2.27 mg/dL — ABNORMAL HIGH (ref 0.61–1.24)
GFR, Estimated: 27 mL/min — ABNORMAL LOW
Glucose, Bld: 96 mg/dL (ref 70–99)
Potassium: 4.6 mmol/L (ref 3.5–5.1)
Sodium: 138 mmol/L (ref 135–145)

## 2024-06-05 LAB — URINE CULTURE: Culture: NO GROWTH

## 2024-06-05 MED ORDER — IPRATROPIUM-ALBUTEROL 0.5-2.5 (3) MG/3ML IN SOLN: 3.0000 mL | RESPIRATORY_TRACT | Status: DC | PRN

## 2024-06-05 MED ORDER — ENSURE PLUS HIGH PROTEIN PO LIQD
237.0000 mL | Freq: Two times a day (BID) | ORAL | Status: DC
  Administered 2024-06-09 – 2024-06-16 (×14): 237 mL via ORAL

## 2024-06-05 NOTE — Plan of Care (Signed)
  Problem: Coping: Goal: Level of anxiety will decrease Outcome: Progressing   Problem: Elimination: Goal: Will not experience complications related to bowel motility Outcome: Progressing   Problem: Pain Managment: Goal: General experience of comfort will improve and/or be controlled Outcome: Progressing

## 2024-06-05 NOTE — TOC Initial Note (Signed)
 Transition of Care [facility]) - Initial/Assessment Note    Patient Details  Name: Guy Roberson MRN: [id number] Date of Birth: 24-Dec-1931  [facility] CM/SW Contact:    Hoy DELENA Bigness, LCSW Phone Number: 06/05/2024, 12:40 PM  Clinical Narrative:                 Pt assessed due to high risk for readmission. Pt lives at home w/ his spouse. Pt has RW and wheelchair at home and uses wheelchair most often for ambulation. Pt's son and DIL live next door from pt and assist with care/transportation as needed. Pt is active with [facility] for HHPT and will need ROC order placed prior to discharge. ICM will continue to follow.   Expected Discharge Plan: Home w Home Health Services Barriers to Discharge: Continued Medical Work up   Patient Goals and CMS Choice Patient states their goals for this hospitalization and ongoing recovery are:: For pt to return home          Expected Discharge Plan and Services In-house Referral: Clinical Social Work Discharge Planning Services: NA Post Acute Care Choice: Resumption of Svcs/PTA Provider, Home Health Living arrangements for the past 2 months: Single Family Home                 DME Arranged: N/A DME Agency: NA                  Prior Living Arrangements/Services Living arrangements for the past 2 months: Single Family Home Lives with:: Spouse Patient language and need for interpreter reviewed:: Yes Do you feel safe going back to the place where you live?: Yes      Need for Family Participation in Patient Care: Yes (Comment) Care giver support system in place?: Yes (comment) Current home services: DME, Home PT (RW, wheelchair, HHPT w/ [facility]) Criminal Activity/Legal Involvement Pertinent to Current Situation/Hospitalization: No - Comment as needed  Activities of Daily Living      Permission Sought/Granted Permission sought to share information with : Facility Medical Sales Representative, Family Supports Permission  granted to share information with : Yes, Verbal Permission Granted              Emotional Assessment   Attitude/Demeanor/Rapport: Unable to Assess Affect (typically observed): Unable to Assess Orientation: : Oriented to Self Alcohol / Substance Use: Not Applicable Psych Involvement: No (comment)  Admission diagnosis:  Urinary tract infection without hematuria, site unspecified [N39.0] Acute on chronic diastolic (congestive) heart failure (HCC) [I50.33] Acute on chronic congestive heart failure, unspecified heart failure type [facility]) [I50.9] Patient Active Problem List   Diagnosis Date Noted   Chronic respiratory failure with hypoxia (HCC) 04/11/2024   Chronic UTI 04/11/2024   CKD stage 3 due to type 2 diabetes mellitus (HCC) 04/11/2024   Anemia in chronic kidney disease (CKD) 04/11/2024   Acute on chronic heart failure with preserved ejection fraction (HFpEF) (HCC) 03/28/2024   Thrombocytopenia 03/28/2024   Benign hypertension with CKD (chronic kidney disease) stage III (HCC) 02/10/2024   Chronic diastolic (congestive) heart failure (HCC) 02/10/2024   Thyroid nodule greater than or equal to 1.5 cm in diameter incidentally noted on imaging study 02/03/2024   Acute metabolic encephalopathy 01/28/2024   Urinary tract infection without hematuria 01/28/2024   CKD stage 3a, GFR 45-59 ml/min (HCC) 01/27/2024   Aorto-iliac atherosclerosis 08/18/2022   Benign hypertension with coincident congestive heart failure (HCC) 08/18/2022   Hyperlipidemia associated with type 2 diabetes mellitus (HCC) 08/18/2022   OAB (overactive  bladder) 08/18/2022   Mild protein-calorie malnutrition 08/18/2022   Chronic constipation 08/18/2022   Neurocognitive deficits 08/18/2022   Acute on chronic diastolic CHF (congestive heart failure) (HCC) 08/12/2022   Fall at home, initial encounter 08/12/2022   AKI (acute kidney injury) 08/12/2022   GERD (gastroesophageal reflux disease) 08/12/2022   Generalized  weakness 03/04/2022   COVID-19 virus infection 03/04/2022   Acute congestive heart failure (HCC) 03/04/2022   Hypokalemia 03/04/2022   Gross hematuria 07/07/2021   Benign prostatic hyperplasia with urinary retention 07/07/2021   Bladder diverticulum 07/07/2021   Neurogenic bladder 07/07/2021   Urinary retention 01/01/2012    Class: Present on Admission   Pacemaker-[employer] 12/30/2011   Acute respiratory failure with hypoxia (HCC) 12/29/2011   Rib fracture 12/29/2011   Hypoxemia 12/29/2011   Syncope    Complete heart block (HCC)    Mixed hyperlipidemia    CAD (coronary artery disease)    Arteriosclerotic cardiovascular disease (ASCVD)    Fasting hyperglycemia    Colonic polyp    Type 2 diabetes mellitus with other circulatory complications (HCC) 10/02/2009   Essential hypertension 09/30/2009   PCP:  [name], MD Pharmacy:   [facility] - [place], [place] - [address] [place] [address] Phone: [phone] Fax: [phone]     Social Drivers of Health (SDOH) Social History: SDOH Screenings   Food Insecurity: Patient Unable To Answer (06/04/2024)  Housing: Patient Unable To Answer (06/04/2024)  Transportation Needs: Patient Unable To Answer (06/04/2024)  Utilities: Patient Unable To Answer (06/04/2024)  Depression (PHQ2-9): Low Risk (08/17/2022)  Social Connections: Patient Unable To Answer (06/04/2024)  Tobacco Use: High Risk (06/03/2024)   SDOH Interventions:     Readmission Risk Interventions    06/05/2024   12:38 PM 04/07/2024    3:11 PM 04/02/2024   10:07 AM  Readmission Risk Prevention Plan  Transportation Screening Complete Complete Complete  PCP or Specialist Appt within 3-5 Days Complete    HRI or Home Care Consult Complete Complete Complete  Social Work Consult for Recovery Care Planning/Counseling Complete Complete Complete  Palliative Care Screening Not Applicable Not Applicable Not Applicable  Medication Review Furniture Conservator/restorer) Complete Complete Complete

## 2024-06-05 NOTE — Progress Notes (Signed)
 " PROGRESS NOTE    Guy Roberson  FMW:[id number] DOB: Mar 02, 1932 DOA: 06/03/2024  PCP: [name], MD    Chief Complaint  Patient presents with   Shortness of Breath    Brief admission narrative:  As per H&P written by Dr. Pearlean on 06/04/2023 Guy Roberson is a 89 y.o. male with medical history significant for CHF, CKD, hypertension, diabetes mellitus, complete heart block pacemaker status. Patient presented to the ED with complaints of weight gain of 8 pounds over the past 1 week, with abdominal distention.  Bilateral lower extremity edema is chronic and at baseline.  Son also reports that patient's speech was confused earlier today-saying things that did not make sense.  He has been compliant with Lasix  40-60 mg daily.   Hospitalized 10/28 to 11/7 also for CHF, diuresed with IV Lasix .  proBNP 3949.   ED Course: O2 sats greater than 95% on room air.  Heart rate 50s to 60s.  Respiratory rate 13-28.  Temperature 98.  Blood pressure systolic 92-117. UA with large leukocytes. Creatinine 1.85. Platelets 76. Chest x-ray shows bibasilar opacities-atelectasis and/or effusion. Head CT negative for acute abnormality. IV Lasix  80 mg x 1 given.    Assessment & Plan:  Acute on chronic diastolic CHF (congestive heart failure) (HCC) -Continue IV diuresis as blood pressure will allow - Follow daily weights/strict intake and output -Follow clinical response -Currently diuresis limited secondary to low blood pressure.  Acute metabolic encephalopathy -In the setting of UTI most likely - Continue constant orientation - Continue IV antibiotics and supportive care.  Urinary tract infection without hematuria Patient with recurrent UTIs - Suppression antibiotic given at baseline -Follow culture result - Continue IV antibiotics - Actiq  acid stable.  Type 2 diabetes mellitus with other circulatory complications (HCC) -Experiencing hypoglycemia - Continue to follow CBG fluctuation. -  Cortisol within normal limits.  Essential hypertension -Currently hypotensive and requiring pressure support - Continue to follow vital signs - Wean off Levophed  as tolerated.  Complete heart block (HCC) -Status post pacemaker implantation - Continue telemetry monitoring - Follow electrolytes stability and follow  CKD stage 3B baseline GFR results. -Appears to be stable - Continue to follow renal function trend - Minimize nephrotoxic agents  Thrombocytopenia - Probably associated with acute infection - No overt bleeding appreciated on exam - Follow platelet counts trend minimizing use of heparin  products.  Social/ethics - Patient DNR/DNI -Overall condition/prognosis guarded  DVT prophylaxis: SCDs Code Status: DNR/DNI. Family Communication: No family at bedside. Disposition:   Status is: Inpatient Remains inpatient appropriate because: Continue IV therapy.   Consultants:  None  Procedures:  See below for x-ray reports.  Antimicrobials:  Ceftriaxone    Subjective: No chest pain, no nausea, no vomiting.  Patient is currently off peripheral pressors reports and maintaining blood pressure.  Still experiencing episodes of confusion/delirium and is demonstrating worsening in his renal function; despite increased urine output appreciated.  Objective: Vitals:   06/05/24 0600 06/05/24 0700 06/05/24 0800 06/05/24 0900  BP: 114/64 135/73 (!) 109/58 (!) 120/52  Pulse: 81 85 72 69  Resp: 15 17 13 16   Temp: (!) 96.8 F (36 C) (!) 96.8 F (36 C) (!) 97 F (36.1 C) (!) 97.3 F (36.3 C)  TempSrc:      SpO2: 98% 96% 100% 97%  Weight:   80.3 kg   Height:   5' 7 (1.702 m)     Intake/Output Summary (Last 24 hours) at 06/05/2024 1136 Last data filed at 06/05/2024 0700  Gross per 24 hour  Intake 1334.52 ml  Output 1800 ml  Net -465.48 ml   Filed Weights   06/04/24 0715 06/05/24 0500 06/05/24 0800  Weight: 66.3 kg 80.3 kg 80.3 kg    Examination: General exam: Awake,  confused but able to follow commands.  No chest pain, no nausea, no vomiting.  Good saturation on room air appreciated. Respiratory system: Decreased breath sounds at the bases; no using accessory muscle. Cardiovascular system: Rate controlled, no rubs, no gallops, no JVD. Gastrointestinal system: Abdomen is lightly distended, nontender to palpation, positive bowel sounds.  Foley catheter in place. Central nervous system: Moving 4 limbs spontaneously.  No focal neurological deficits.  Patient examination limited due to acute encephalopathy process. Extremities: No cyanosis or clubbing; 1+ edema appreciated bilaterally. Skin: No petechiae. Psychiatry: Judgement and insight appear impaired secondary to encephalopathy.  Data Reviewed: I have personally reviewed following labs and imaging studies  CBC: Recent Labs  Lab 06/03/24 1416 06/03/24 2110  WBC 6.2 6.2  NEUTROABS 5.0 4.9  HGB 10.6* 10.9*  HCT 33.4* 35.5*  MCV 92.3 93.2  PLT 76* 78*    Basic Metabolic Panel: Recent Labs  Lab 06/03/24 1416 06/03/24 2110 06/04/24 0443 06/05/24 0441  NA 133* 134* 135 138  K 6.1* 5.8* 5.4* 4.6  CL 102 103 102 101  CO2 19* 18* 24 25  GLUCOSE 108* 110* 101* 96  BUN 48* 46* 48* 48*  CREATININE 1.85* 1.81* 1.91* 2.27*  CALCIUM  9.6 9.9 9.4 9.4  MG  --  2.3  --   --     GFR: Estimated Creatinine Clearance: 22 mL/min (A) (by C-G formula based on SCr of 2.27 mg/dL (H)).  Liver Function Tests: Recent Labs  Lab 06/03/24 1416 06/03/24 2110  AST 28 27  ALT 27 27  ALKPHOS 99 103  BILITOT 0.3 0.3  PROT 6.3* 6.4*  ALBUMIN 3.9 4.0    CBG: Recent Labs  Lab 06/04/24 2035 06/05/24 0002 06/05/24 0442 06/05/24 0748 06/05/24 1131  GLUCAP 151* 96 109* 101* 112*    Recent Results (from the past 240 hours)  Urine Culture     Status: None   Collection Time: 06/03/24  2:16 PM   Specimen: Urine, Random  Result Value Ref Range Status   Specimen Description   Final    URINE,  RANDOM Performed at [facility], [address], [place], [place] [address]    Special Requests   Final    NONE Reflexed from [id number] Performed at [facility], [address], [place], [place] [address]    Culture   Final    NO GROWTH Performed at [facility], [address], [place], [place] [address]    Report Status 06/05/2024 FINAL  Final  Resp panel by RT-PCR (RSV, Flu A&B, Covid) Anterior Nasal Swab     Status: None   Collection Time: 06/03/24  2:28 PM   Specimen: Anterior Nasal Swab  Result Value Ref Range Status   SARS Coronavirus 2 by RT PCR NEGATIVE NEGATIVE Final    Comment: (NOTE) SARS-CoV-2 target nucleic acids are NOT DETECTED.  The SARS-CoV-2 RNA is generally detectable in upper respiratory specimens during the acute phase of infection. The lowest concentration of SARS-CoV-2 viral copies this assay can detect is 138 copies/mL. A negative result does not preclude SARS-Cov-2 infection and should not be used as the sole basis for treatment or other patient management decisions. A negative result may occur with  improper specimen collection/handling, submission  of specimen other than nasopharyngeal swab, presence of viral mutation(s) within the areas targeted by this assay, and inadequate number of viral copies(<138 copies/mL). A negative result must be combined with clinical observations, patient history, and epidemiological information. The expected result is Negative.  Fact Sheet for Patients:  bloggercourse.com  Fact Sheet for Healthcare Providers:  seriousbroker.it  This test is no t yet approved or cleared by the [place]  FDA and  has been authorized for detection and/or diagnosis of SARS-CoV-2 by FDA under an Emergency Use Authorization (EUA). This EUA will remain  in effect (meaning this test can be used) for the duration of the COVID-19 declaration under Section 564(b)(1) of the Act,  21 U.S.C.section 360bbb-3(b)(1), unless the authorization is terminated  or revoked sooner.       Influenza A by PCR NEGATIVE NEGATIVE Final   Influenza B by PCR NEGATIVE NEGATIVE Final    Comment: (NOTE) The Xpert Xpress SARS-CoV-2/FLU/RSV plus assay is intended as an aid in the diagnosis of influenza from Nasopharyngeal swab specimens and should not be used as a sole basis for treatment. Nasal washings and aspirates are unacceptable for Xpert Xpress SARS-CoV-2/FLU/RSV testing.  Fact Sheet for Patients: bloggercourse.com  Fact Sheet for Healthcare Providers: seriousbroker.it  This test is not yet approved or cleared by the [place]  FDA and has been authorized for detection and/or diagnosis of SARS-CoV-2 by FDA under an Emergency Use Authorization (EUA). This EUA will remain in effect (meaning this test can be used) for the duration of the COVID-19 declaration under Section 564(b)(1) of the Act, 21 U.S.C. section 360bbb-3(b)(1), unless the authorization is terminated or revoked.     Resp Syncytial Virus by PCR NEGATIVE NEGATIVE Final    Comment: (NOTE) Fact Sheet for Patients: bloggercourse.com  Fact Sheet for Healthcare Providers: seriousbroker.it  This test is not yet approved or cleared by the [place]  FDA and has been authorized for detection and/or diagnosis of SARS-CoV-2 by FDA under an Emergency Use Authorization (EUA). This EUA will remain in effect (meaning this test can be used) for the duration of the COVID-19 declaration under Section 564(b)(1) of the Act, 21 U.S.C. section 360bbb-3(b)(1), unless the authorization is terminated or revoked.  Performed at [facility], [address] [place], [place] [address]   MRSA Next Gen by PCR, Nasal     Status: None   Collection Time: 06/03/24  9:00 PM   Specimen: Nasal Mucosa; Nasal Swab  Result Value Ref  Range Status   MRSA by PCR Next Gen NOT DETECTED NOT DETECTED Final    Comment: (NOTE) The GeneXpert MRSA Assay (FDA approved for NASAL specimens only), is one component of a comprehensive MRSA colonization surveillance program. It is not intended to diagnose MRSA infection nor to guide or monitor treatment for MRSA infections. Test performance is not FDA approved in patients less than 51 years old. Performed at [facility], [address], [place], [place] [address]   Culture, blood (Routine X 2) w Reflex to ID Panel     Status: None (Preliminary result)   Collection Time: 06/03/24  9:10 PM   Specimen: BLOOD LEFT HAND  Result Value Ref Range Status   Specimen Description BLOOD LEFT HAND  Final   Special Requests   Final    BOTTLES DRAWN AEROBIC AND ANAEROBIC Blood Culture adequate volume   Culture   Final    NO GROWTH 1 DAY Performed at [facility], [address], [place], [place] [address]    Report  Status PENDING  Incomplete  Culture, blood (Routine X 2) w Reflex to ID Panel     Status: None (Preliminary result)   Collection Time: 06/04/24  4:43 AM   Specimen: BLOOD LEFT HAND  Result Value Ref Range Status   Specimen Description BLOOD LEFT HAND  Final   Special Requests   Final    BOTTLES DRAWN AEROBIC AND ANAEROBIC Blood Culture adequate volume   Culture   Final    NO GROWTH 1 DAY Performed at [facility], [address] [place], [place] [address]    Report Status PENDING  Incomplete    Radiology Studies: US  RENAL Result Date: 06/04/2024 CLINICAL DATA:  Acute renal failure EXAM: RENAL / URINARY TRACT ULTRASOUND COMPLETE COMPARISON:  March 28, 2024 FINDINGS: Right Kidney: Renal measurements: 9.5 x 5.8 x 5.8 cm = volume: 168 mL. Minimally increased echogenicity of renal parenchyma is noted suggesting medical renal disease. No mass or hydronephrosis visualized. Left Kidney: Renal measurements: 11.2 x 5.6 x 5.6 cm = volume: 185 mL. Probable mild hydronephrosis is noted.  Minimally increased echogenicity is noted suggesting medical renal disease. 1.5 cm exophytic simple cyst is noted. Bladder: Decompressed secondary to Foley catheter. Other: Minimal ascites is noted. IMPRESSION: 1. Probable mild left hydronephrosis is noted. 2. Minimally increased echogenicity of renal parenchyma is noted bilaterally suggesting medical renal disease. 3. Minimal ascites. Electronically Signed   By: Lynwood Landy Raddle M.D.   On: 06/04/2024 10:53   DG Chest 2 View Result Date: 06/03/2024 CLINICAL DATA:  Shortness of breath EXAM: CHEST - 2 VIEW COMPARISON:  April 01, 2024 FINDINGS: Stable cardiomegaly. Status post coronary bypass graft. Left-sided pacemaker is unchanged. Elevated right hemidiaphragm is noted with mild right basilar atelectasis possible small right pleural effusion. Possible left basilar opacity suggesting atelectasis and/or effusion. Bony thorax is unremarkable. IMPRESSION: Bibasilar opacities as described above. Electronically Signed   By: Lynwood Landy Raddle M.D.   On: 06/03/2024 16:08   CT Head Wo Contrast Result Date: 06/03/2024 EXAM: CT HEAD WITHOUT CONTRAST 06/03/2024 03:07:00 PM TECHNIQUE: CT of the head was performed without the administration of intravenous contrast. Automated exposure control, iterative reconstruction, and/or weight based adjustment of the mA/kV was utilized to reduce the radiation dose to as low as reasonably achievable. COMPARISON: 03/28/2024 CLINICAL HISTORY: Mental status change, unknown cause FINDINGS: BRAIN AND VENTRICLES: No acute hemorrhage. No evidence of acute infarct. No hydrocephalus. No extra-axial collection. No mass effect or midline shift. Mild generalized atrophy and white matter changes are likely within normal limits for age. Atherosclerotic calcifications are present in the cavernous carotid arteries bilaterally and at the dural margin of both vertebral arteries. No hyperdense vessel is present. ORBITS: Bilateral lens replacements are noted.  The globes and orbits are otherwise within normal limits. SINUSES: No acute abnormality. SOFT TISSUES AND SKULL: No acute soft tissue abnormality. No skull fracture. IMPRESSION: 1. No acute intracranial abnormality. Electronically signed by: Lonni Necessary MD 06/03/2024 03:23 PM EST RP Workstation: [id number]    Scheduled Meds:  aspirin  EC  81 mg Oral Daily   Chlorhexidine  Gluconate Cloth  6 each Topical Daily   furosemide   40 mg Intravenous BID   hydrocortisone  sod succinate (SOLU-CORTEF ) inj  100 mg Intravenous Q12H   tamsulosin   0.4 mg Oral BID   Continuous Infusions:  cefTRIAXone  (ROCEPHIN )  IV 2 g (06/04/24 1657)   dextrose  30 mL/hr at 06/05/24 0700   norepinephrine  (LEVOPHED ) Adult infusion Stopped (06/04/24 1857)     LOS: 2 days  CRITICAL CARE Performed by: Eric Nunnery   Total critical care time: 50 minutes  Critical care time was exclusive of separately billable procedures and treating other patients.  Critical care was necessary to treat or prevent imminent or life-threatening deterioration.  Critical care was time spent personally by me on the following activities: development of treatment plan with patient and/or surrogate as well as nursing, discussions with consultants, evaluation of patient's response to treatment, examination of patient, obtaining history from patient or surrogate, ordering and performing treatments and interventions, ordering and review of laboratory studies, ordering and review of radiographic studies, pulse oximetry and re-evaluation of patient's condition.     Eric Nunnery, MD [facility]   To contact the attending provider between 7A-7P or the covering provider during after hours 7P-7A, please log into the web site [email or web address] and access using universal [place] password for that web site. If you do not have the password, please call the hospital operator.  06/05/2024, 11:36 AM    "

## 2024-06-05 NOTE — Plan of Care (Signed)
  Problem: Nutrition: Goal: Adequate nutrition will be maintained Outcome: Not Progressing   Problem: Coping: Goal: Level of anxiety will decrease Outcome: Progressing   Problem: Pain Managment: Goal: General experience of comfort will improve and/or be controlled Outcome: Progressing

## 2024-06-05 NOTE — Plan of Care (Signed)
" °  Problem: Education: Goal: Knowledge of General Education information will improve Description: Including pain rating scale, medication(s)/side effects and non-pharmacologic comfort measures Outcome: Not Progressing   Problem: Health Behavior/Discharge Planning: Goal: Ability to manage health-related needs will improve Outcome: Not Progressing   Problem: Clinical Measurements: Goal: Ability to maintain clinical measurements within normal limits will improve Outcome: Not Progressing Goal: Will remain free from infection Outcome: Not Progressing Goal: Diagnostic test results will improve Outcome: Not Progressing Goal: Respiratory complications will improve Outcome: Progressing Goal: Cardiovascular complication will be avoided Outcome: Progressing   Problem: Activity: Goal: Risk for activity intolerance will decrease Outcome: Not Progressing   Problem: Nutrition: Goal: Adequate nutrition will be maintained Outcome: Not Progressing   Problem: Coping: Goal: Level of anxiety will decrease Outcome: Not Progressing   Problem: Elimination: Goal: Will not experience complications related to bowel motility Outcome: Not Progressing Goal: Will not experience complications related to urinary retention Outcome: Not Progressing   Problem: Pain Managment: Goal: General experience of comfort will improve and/or be controlled Outcome: Not Progressing   Problem: Safety: Goal: Ability to remain free from injury will improve Outcome: Not Progressing   Problem: Skin Integrity: Goal: Risk for impaired skin integrity will decrease Outcome: Not Progressing   Problem: Education: Goal: Ability to demonstrate management of disease process will improve Outcome: Not Progressing Goal: Ability to verbalize understanding of medication therapies will improve Outcome: Not Progressing Goal: Individualized Educational Video(s) Outcome: Not Progressing   Problem: Activity: Goal: Capacity to  carry out activities will improve Outcome: Not Progressing   Problem: Cardiac: Goal: Ability to achieve and maintain adequate cardiopulmonary perfusion will improve Outcome: Not Progressing   "

## 2024-06-06 DIAGNOSIS — Z515 Encounter for palliative care: Secondary | ICD-10-CM | POA: Diagnosis not present

## 2024-06-06 DIAGNOSIS — I5033 Acute on chronic diastolic (congestive) heart failure: Secondary | ICD-10-CM | POA: Diagnosis not present

## 2024-06-06 DIAGNOSIS — Z7189 Other specified counseling: Secondary | ICD-10-CM | POA: Diagnosis not present

## 2024-06-06 DIAGNOSIS — G9341 Metabolic encephalopathy: Secondary | ICD-10-CM | POA: Diagnosis not present

## 2024-06-06 LAB — GLUCOSE, CAPILLARY
Glucose-Capillary: 113 mg/dL — ABNORMAL HIGH (ref 70–99)
Glucose-Capillary: 139 mg/dL — ABNORMAL HIGH (ref 70–99)
Glucose-Capillary: 144 mg/dL — ABNORMAL HIGH (ref 70–99)
Glucose-Capillary: 151 mg/dL — ABNORMAL HIGH (ref 70–99)
Glucose-Capillary: 164 mg/dL — ABNORMAL HIGH (ref 70–99)
Glucose-Capillary: 99 mg/dL (ref 70–99)

## 2024-06-06 LAB — BASIC METABOLIC PANEL WITH GFR
Anion gap: 12 (ref 5–15)
BUN: 45 mg/dL — ABNORMAL HIGH (ref 8–23)
CO2: 28 mmol/L (ref 22–32)
Calcium: 9.5 mg/dL (ref 8.9–10.3)
Chloride: 102 mmol/L (ref 98–111)
Creatinine, Ser: 2.06 mg/dL — ABNORMAL HIGH (ref 0.61–1.24)
GFR, Estimated: 30 mL/min — ABNORMAL LOW
Glucose, Bld: 96 mg/dL (ref 70–99)
Potassium: 3.5 mmol/L (ref 3.5–5.1)
Sodium: 142 mmol/L (ref 135–145)

## 2024-06-06 MED ORDER — FUROSEMIDE 10 MG/ML IJ SOLN
20.0000 mg | Freq: Once | INTRAMUSCULAR | Status: AC
  Administered 2024-06-06: 20 mg via INTRAVENOUS
  Filled 2024-06-06: qty 2

## 2024-06-06 MED ORDER — IPRATROPIUM-ALBUTEROL 0.5-2.5 (3) MG/3ML IN SOLN
3.0000 mL | Freq: Once | RESPIRATORY_TRACT | Status: AC
  Administered 2024-06-06: 3 mL via RESPIRATORY_TRACT
  Filled 2024-06-06: qty 3

## 2024-06-06 NOTE — Evaluation (Signed)
 Clinical/Bedside Swallow Evaluation Patient Details  Name: Guy Roberson MRN: [id number] Date of Birth: [birth year]  Today's Date: 06/06/2024 Time: SLP Start Time (ACUTE ONLY): 1025 SLP Stop Time (ACUTE ONLY): 1052 SLP Time Calculation (min) (ACUTE ONLY): 27 min  Past Medical History:  Past Medical History:  Diagnosis Date   Arthritis    CAD (coronary artery disease)    a.  90% LAD stenosis in 1995 treated with PTCA;  b. 09/2009 CABG x 4: LIMA->LAD, VG->Diag, VG->OM, VG->PDA;  c. 11/2011 Cath: 3VD, 4/4 patent grafts, EF 70%.   Chronic constipation    Colonic polyp    Complete heart block (HCC)    a. 11/2011 s/p MDT Adapta L ADDRL 1 Ser # [id number] H.   Complication of anesthesia    PROBLEMS VOIDING AFTER PREVIOUS SURGERIES   Diabetes mellitus with vascular complication (HCC)    NOT ON ANY DIABETIC MEDICATIONS   GERD (gastroesophageal reflux disease)    Hyperlipidemia    Hypertension    Pacemaker 11/2011   DR.  G.TAYLOR AND DR. DOROTHA RAKERS   Pseudophakia of both eyes    Seizures (HCC)    a. prior to diagnosis of heart block and syncope, this was in the differential and pt was briefly on Keppra , started by ER MD.   Syncope    a. 11/2011 18 second pauses noted on Event Monitor assoc w/ syncope   Trauma 1952   Abdominal and chest in 1952; multiple injuries including pelvic fracture, rib fractures and ruptured bladder   Urinary retention    PT HAS INDWELLING FOLEY CATHETER   Past Surgical History:  Past Surgical History:  Procedure Laterality Date   BIOPSY  07/05/2019   Procedure: BIOPSY;  Surgeon: Golda Claudis PENNER, MD;  [facility];  Service: Endoscopy;;  gastric   BLADDER SURGERY  1950'S   CATARACT EXTRACTION W/PHACO Right 11/22/2012   Procedure: CATARACT EXTRACTION PHACO AND INTRAOCULAR LENS PLACEMENT (IOC);  Surgeon: Oneil T. Roz, MD;  Location: [facility];  Service: Ophthalmology;  Laterality: Right;  CDE 10.27   CATARACT EXTRACTION W/PHACO Left 12/20/2012   Procedure:  CATARACT EXTRACTION PHACO AND INTRAOCULAR LENS PLACEMENT (IOC);  Surgeon: Oneil T. Roz, MD;  Location: [facility];  Service: Ophthalmology;  Laterality: Left;  CDE:6.67   COLONOSCOPY W/ POLYPECTOMY     CORONARY ARTERY BYPASS GRAFT  09/2009   4 vessels   ESOPHAGEAL DILATION  07/05/2019   Procedure: ESOPHAGEAL DILATION;  Surgeon: Golda Claudis PENNER, MD;  [facility];  Service: Endoscopy;;   ESOPHAGOGASTRODUODENOSCOPY (EGD) WITH PROPOFOL  N/A 07/05/2019   Procedure: ESOPHAGOGASTRODUODENOSCOPY (EGD) WITH PROPOFOL ;  Surgeon: Golda Claudis PENNER, MD;  [facility]: [facility];  Service: Endoscopy;  Laterality: N/A;  110-office notified pt new arrival time 10:45am   FEMORAL HERNIA REPAIR     INSERTION OF SUPRAPUBIC CATHETER  04/04/2012   Procedure: INSERTION OF SUPRAPUBIC CATHETER;  Surgeon: Oneil JAYSON Rafter, MD;  Location: [facility];  Service: Urology;  Laterality: N/A;   LEAD REVISION N/A 12/30/2011   Procedure: LEAD REVISION;  Surgeon: Danelle LELON Birmingham, MD;  Location: [facility] CATH LAB;  Service: Cardiovascular;  Laterality: N/A;   LEFT HEART CATHETERIZATION WITH CORONARY/GRAFT ANGIOGRAM  12/29/2011   Procedure: LEFT HEART CATHETERIZATION WITH EL BILE;  Surgeon: Ozell Fell, MD;  Location: [facility] CATH LAB;  Service: Cardiovascular;;   PACEMAKER PLACEMENT     PPM GENERATOR CHANGEOUT N/A 03/09/2024   Procedure: PPM GENERATOR CHANGEOUT;  Surgeon: Birmingham Danelle LELON, MD;  Location: [facility] INVASIVE  CV LAB;  Service: Cardiovascular;  Laterality: N/A;   TEMPORARY PACEMAKER INSERTION N/A 12/29/2011   Procedure: TEMPORARY PACEMAKER INSERTION;  Surgeon: Ozell Fell, MD;  Location: [facility] CATH LAB;  Service: Cardiovascular;  Laterality: N/A;   TRANSURETHRAL PROSTATECTOMY WITH GYRUS INSTRUMENTS  04/04/2012   Procedure: TRANSURETHRAL PROSTATECTOMY WITH GYRUS INSTRUMENTS;  Surgeon: Mark C Ottelin, MD;  Location: [facility];  Service: Urology;  Laterality: N/A;  TURP with Gyrus and Suprapubic Tube Placement   YAG LASER  APPLICATION Left 06/25/2015   Procedure: YAG LASER APPLICATION;  Surgeon: Oneil Platts, MD;  Location: [facility];  Service: Ophthalmology;  Laterality: Left;   HPI:  [name] is a 89 y.o. male with medical history significant for CHF, CKD, hypertension, diabetes mellitus, complete heart block pacemaker status.  Patient presented to the ED on 06/03/24 with complaints of weight gain of 8 pounds over the past 1 week, with abdominal distention.  Bilateral lower extremity edema is chronic and at baseline.  Son also reports that patient's speech was confused earlier today-saying things that did not make sense.  He has been compliant with Lasix  40-60 mg daily.  CXR indicated bibasilar opacities vs atelectasis or effusion.  Head CT negative for acute changes. ST consulted for clinical swallow evaluation.  Pt currently on Dysphagia 2(minced)/thin liquid diet.    Assessment / Plan / Recommendation  Clinical Impression  Recommend downgrading diet to Dysphagia 1(puree)/thin via tsp and/or small sips with MAX multimodal cueing for oral retention and initiating swallow response IF PT ALERT/responsive.  Pt requires max cues and close proximity to complete tasks d/t profound hearing loss.  ST will f/u in acute setting for diet progression/dysphagia tx/education.  Pt seen for clinical swallow evaluation with cognitive-based dysphagia suspected d/t impaired mentation/alertness level and probable hospital delirium onset per MD report/pt presentation.  Pt observed to orally retain various boluses including ice chips (single), puree and thin via tsp and/or cup sips.  Limited oral awareness and impaired mentation (pt only has upper dentures).  Max multimodal cues provided by SLP for oral care and po trials with anterior loss on R noted with thin without cueing provided.  Pt would respond to 1-step oral commands (ie: open your mouth) when SLP was in close proximity and spoke with increased intensity d/t profound hearing  loss/decreased pt mentation.  Pt did display one incident of delayed cough after all po trials completed and during oral suctioning.  Pt expelled puree bolus from oral cavity vs initiating a swallow with this consistency.  Discussed plan of care and goals with family members/nursing staff.  Thank you for this consult. SLP Visit Diagnosis: Dysphagia, unspecified (R13.10)    Aspiration Risk  Mild aspiration risk;Risk for inadequate nutrition/hydration    Diet Recommendation   Thin;Dysphagia 1 (puree)  Medication Administration: Via alternative means (or crushed in puree if alert/mentation improved)    Other Recommendations Oral Care Recommendations: Oral care QID Caregiver Recommendations: Have oral suction available     Swallow Evaluation Recommendations Caregiver Recommendations: Have oral suction available   Assistance Recommended at Discharge  TBD  Functional Status Assessment Patient has had a recent decline in their functional status and demonstrates the ability to make significant improvements in function in a reasonable and predictable amount of time.  Frequency and Duration min 2x/week  1 week       Prognosis Prognosis for improved oropharyngeal function: Good      Swallow Study   General Date of Onset: 06/03/24 HPI: Kooper Godshall is  a 89 y.o. male with medical history significant for CHF, CKD, hypertension, diabetes mellitus, complete heart block pacemaker status.  Patient presented to the ED on 06/03/24 with complaints of weight gain of 8 pounds over the past 1 week, with abdominal distention.  Bilateral lower extremity edema is chronic and at baseline.  Son also reports that patient's speech was confused earlier today-saying things that did not make sense.  He has been compliant with Lasix  40-60 mg daily.  CXR indicated bibasilar opacities vs atelectasis or effusion.  Head CT negative for acute changes. ST consulted for clinical swallow evaluation.  Pt currently on Dysphagia  2(minced)/thin liquid diet. Type of Study: Bedside Swallow Evaluation Previous Swallow Assessment: n/a Diet Prior to this Study: Dysphagia 2 (finely chopped);Thin liquids (Level 0) Temperature Spikes Noted: Yes (low grade, 99) Respiratory Status: Nasal cannula (2L) History of Recent Intubation: No Behavior/Cognition: Lethargic/Drowsy;Requires cueing Oral Cavity Assessment: Excessive secretions Oral Care Completed by SLP: Yes Oral Cavity - Dentition: Dentures, top;Missing dentition Vision: Impaired for self-feeding Self-Feeding Abilities: Total assist Patient Positioning: Upright in bed Baseline Vocal Quality: Not observed Volitional Cough: Weak Volitional Swallow: Unable to elicit    Oral/Motor/Sensory Function Overall Oral Motor/Sensory Function: Generalized oral weakness   Ice Chips Ice chips: Impaired Presentation: Spoon Oral Phase Impairments: Impaired mastication;Poor awareness of bolus;Reduced lingual movement/coordination Oral Phase Functional Implications: Prolonged oral transit;Oral holding;Other (comment) (required max cues) Pharyngeal Phase Impairments: Suspected delayed Swallow   Thin Liquid Thin Liquid: Impaired Presentation: Spoon Oral Phase Functional Implications: Oral holding;Prolonged oral transit;Right anterior spillage Pharyngeal  Phase Impairments: Suspected delayed Swallow    Nectar Thick Nectar Thick Liquid: Not tested   Honey Thick Honey Thick Liquid: Not tested   Puree Puree: Impaired Presentation: Spoon Oral Phase Impairments: Poor awareness of bolus;Reduced lingual movement/coordination Oral Phase Functional Implications: Oral holding;Prolonged oral transit Pharyngeal Phase Impairments: Suspected delayed Swallow   Solid     Solid: Not tested      Pat Biff Rutigliano,M.S.,CCC-SLP 06/06/2024,11:11 AM

## 2024-06-06 NOTE — Progress Notes (Signed)
 " PROGRESS NOTE    Guy Roberson  FMW:[id number] DOB: July 16, 1931 DOA: 06/03/2024  PCP: [name], MD    Chief Complaint  Patient presents with   Shortness of Breath    Brief admission narrative:  As per H&P written by Dr. Pearlean on 06/04/2023 Guy Roberson is a 89 y.o. male with medical history significant for CHF, CKD, hypertension, diabetes mellitus, complete heart block pacemaker status. Patient presented to the ED with complaints of weight gain of 8 pounds over the past 1 week, with abdominal distention.  Bilateral lower extremity edema is chronic and at baseline.  Son also reports that patient's speech was confused earlier today-saying things that did not make sense.  He has been compliant with Lasix  40-60 mg daily.   Hospitalized 10/28 to 11/7 also for CHF, diuresed with IV Lasix .  proBNP 3949.   ED Course: O2 sats greater than 95% on room air.  Heart rate 50s to 60s.  Respiratory rate 13-28.  Temperature 98.  Blood pressure systolic 92-117. UA with large leukocytes. Creatinine 1.85. Platelets 76. Chest x-ray shows bibasilar opacities-atelectasis and/or effusion. Head CT negative for acute abnormality. IV Lasix  80 mg x 1 given.    Assessment & Plan:  Acute on chronic diastolic CHF (congestive heart failure) (HCC) -Continue IV diuresis as blood pressure will allow - Follow daily weights/strict intake and output - Continue to follow clinical response - Slowly improving and demonstrating good urine output - Continue to follow vital signs - Follow renal function trend and volume status  Acute metabolic encephalopathy/hospital-acquired delirium -In the setting of UTI most likely - Continue constant orientation - Continue IV antibiotics and supportive care. - Avoid sedative agents and continue constant reorientation  Urinary tract infection without hematuria Patient with recurrent UTIs - Suppression antibiotic given at baseline -Follow culture result - Continue  IV antibiotics; good urine output appreciated.  Type 2 diabetes mellitus with other circulatory complications (HCC) -Experiencing hypoglycemia - Continue to follow CBG fluctuation. - Cortisol within normal limits.  Essential hypertension -Currently hypotensive and requiring pressure support - Continue to follow vital signs - Wean off Levophed  as tolerated.  Complete heart block (HCC) -Status post pacemaker implantation - Continue telemetry monitoring - Follow electrolytes stability and follow  CKD stage 3B baseline GFR results. -Appears to be stable - Continue to follow renal function trend - Continue to minimize nephrotoxic agents  Thrombocytopenia - Probably associated with acute infection - No overt bleeding appreciated on exam - Follow platelet counts trend and continue minimizing/avoiding the use of heparin  products.  Social/ethics - Patient DNR/DNI -Overall condition/prognosis guarded - Palliative care has been consulted and will follow further recommendations.  DVT prophylaxis: SCDs Code Status: DNR/DNI. Family Communication: Son and daughter-in-law at bedside. Disposition:   Status is: Inpatient Remains inpatient appropriate because: Continue IV therapy.   Consultants:  None  Procedures:  See below for x-ray reports.  Antimicrobials:  Ceftriaxone    Subjective: No chest pain, no nausea, no vomiting.  There have been improvement in his urine output and overall stabilization, vital signs with also improvement in his renal function.  Mentation has further worsened and at the moment he is most likely experiencing some hospital-acquired delirium making him unable to appropriately take by mouth medications or adequate hydration/nutrition.  Objective: Vitals:   06/06/24 0800 06/06/24 0900 06/06/24 1000 06/06/24 1100  BP: (!) 126/53 (!) 129/59 (!) 136/54 131/79  Pulse: 87 86 81 89  Resp: 14 13 13 14   Temp: 99 F (  37.2 C) 99 F (37.2 C) 99 F (37.2 C) 99 F  (37.2 C)  TempSrc:      SpO2: 94% 95% 96% 95%  Weight:      Height:        Intake/Output Summary (Last 24 hours) at 06/06/2024 1147 Last data filed at 06/06/2024 0700 Gross per 24 hour  Intake 803.2 ml  Output 1900 ml  Net -1096.8 ml   Filed Weights   06/05/24 0500 06/05/24 0800 06/06/24 0446  Weight: 80.3 kg 80.3 kg 82 kg    Examination: General exam: Alert, awake, confused/disoriented, currently unsafe to take by mouth. Respiratory system: Good saturation on 2 L supplementations appreciated on exam; decreased breath sounds and fine crackles appreciated at the bases.  No using accessory muscles. Cardiovascular system: Rate controlled, no rubs, no gallops, no JVD.  Telemetry demonstrating ventricular paced. Gastrointestinal system: Abdomen is soft, without guarding and positive bowel sounds.  Overall improvement in patient's abdominal distention/generalized swelling. Central nervous system: Moving 4 limbs spontaneously; limited examination given current mentation. No focal neurological deficits. Extremities: No cyanosis or clubbing; 1+ edema appreciated bilaterally. Skin: No petechiae. Psychiatry: Judgement and insight impaired secondary to encephalopathy.  Data Reviewed: I have personally reviewed following labs and imaging studies  CBC: Recent Labs  Lab 06/03/24 1416 06/03/24 2110  WBC 6.2 6.2  NEUTROABS 5.0 4.9  HGB 10.6* 10.9*  HCT 33.4* 35.5*  MCV 92.3 93.2  PLT 76* 78*    Basic Metabolic Panel: Recent Labs  Lab 06/03/24 1416 06/03/24 2110 06/04/24 0443 06/05/24 0441 06/06/24 0439  NA 133* 134* 135 138 142  K 6.1* 5.8* 5.4* 4.6 3.5  CL 102 103 102 101 102  CO2 19* 18* 24 25 28   GLUCOSE 108* 110* 101* 96 96  BUN 48* 46* 48* 48* 45*  CREATININE 1.85* 1.81* 1.91* 2.27* 2.06*  CALCIUM  9.6 9.9 9.4 9.4 9.5  MG  --  2.3  --   --   --     GFR: Estimated Creatinine Clearance: 24.4 mL/min (A) (by C-G formula based on SCr of 2.06 mg/dL (H)).  Liver Function  Tests: Recent Labs  Lab 06/03/24 1416 06/03/24 2110  AST 28 27  ALT 27 27  ALKPHOS 99 103  BILITOT 0.3 0.3  PROT 6.3* 6.4*  ALBUMIN 3.9 4.0    CBG: Recent Labs  Lab 06/05/24 1609 06/05/24 2008 06/05/24 2331 06/06/24 0443 06/06/24 0740  GLUCAP 107* 141* 144* 99 113*    Recent Results (from the past 240 hours)  Urine Culture     Status: None   Collection Time: 06/03/24  2:16 PM   Specimen: Urine, Random  Result Value Ref Range Status   Specimen Description   Final    URINE, RANDOM Performed at [facility], [address] [place], [place] [address]    Special Requests   Final    NONE Reflexed from [id number] Performed at [facility], [address] [place], [place] [address]    Culture   Final    NO GROWTH Performed at [facility], [address], [place], [place] [address]    Report Status 06/05/2024 FINAL  Final  Resp panel by RT-PCR (RSV, Flu A&B, Covid) Anterior Nasal Swab     Status: None   Collection Time: 06/03/24  2:28 PM   Specimen: Anterior Nasal Swab  Result Value Ref Range Status   SARS Coronavirus 2 by RT PCR NEGATIVE NEGATIVE Final    Comment: (NOTE) SARS-CoV-2 target nucleic  acids are NOT DETECTED.  The SARS-CoV-2 RNA is generally detectable in upper respiratory specimens during the acute phase of infection. The lowest concentration of SARS-CoV-2 viral copies this assay can detect is 138 copies/mL. A negative result does not preclude SARS-Cov-2 infection and should not be used as the sole basis for treatment or other patient management decisions. A negative result may occur with  improper specimen collection/handling, submission of specimen other than nasopharyngeal swab, presence of viral mutation(s) within the areas targeted by this assay, and inadequate number of viral copies(<138 copies/mL). A negative result must be combined with clinical observations, patient history, and epidemiological information. The expected result is  Negative.  Fact Sheet for Patients:  bloggercourse.com  Fact Sheet for Healthcare Providers:  seriousbroker.it  This test is no t yet approved or cleared by the [place]  FDA and  has been authorized for detection and/or diagnosis of SARS-CoV-2 by FDA under an Emergency Use Authorization (EUA). This EUA will remain  in effect (meaning this test can be used) for the duration of the COVID-19 declaration under Section 564(b)(1) of the Act, 21 U.S.C.section 360bbb-3(b)(1), unless the authorization is terminated  or revoked sooner.       Influenza A by PCR NEGATIVE NEGATIVE Final   Influenza B by PCR NEGATIVE NEGATIVE Final    Comment: (NOTE) The Xpert Xpress SARS-CoV-2/FLU/RSV plus assay is intended as an aid in the diagnosis of influenza from Nasopharyngeal swab specimens and should not be used as a sole basis for treatment. Nasal washings and aspirates are unacceptable for Xpert Xpress SARS-CoV-2/FLU/RSV testing.  Fact Sheet for Patients: bloggercourse.com  Fact Sheet for Healthcare Providers: seriousbroker.it  This test is not yet approved or cleared by the [place]  FDA and has been authorized for detection and/or diagnosis of SARS-CoV-2 by FDA under an Emergency Use Authorization (EUA). This EUA will remain in effect (meaning this test can be used) for the duration of the COVID-19 declaration under Section 564(b)(1) of the Act, 21 U.S.C. section 360bbb-3(b)(1), unless the authorization is terminated or revoked.     Resp Syncytial Virus by PCR NEGATIVE NEGATIVE Final    Comment: (NOTE) Fact Sheet for Patients: bloggercourse.com  Fact Sheet for Healthcare Providers: seriousbroker.it  This test is not yet approved or cleared by the [place]  FDA and has been authorized for detection and/or diagnosis of  SARS-CoV-2 by FDA under an Emergency Use Authorization (EUA). This EUA will remain in effect (meaning this test can be used) for the duration of the COVID-19 declaration under Section 564(b)(1) of the Act, 21 U.S.C. section 360bbb-3(b)(1), unless the authorization is terminated or revoked.  Performed at [facility], [address] [place], [place] [address]   MRSA Next Gen by PCR, Nasal     Status: None   Collection Time: 06/03/24  9:00 PM   Specimen: Nasal Mucosa; Nasal Swab  Result Value Ref Range Status   MRSA by PCR Next Gen NOT DETECTED NOT DETECTED Final    Comment: (NOTE) The GeneXpert MRSA Assay (FDA approved for NASAL specimens only), is one component of a comprehensive MRSA colonization surveillance program. It is not intended to diagnose MRSA infection nor to guide or monitor treatment for MRSA infections. Test performance is not FDA approved in patients less than 81 years old. Performed at [facility], [address] [place], [place] [address]   Culture, blood (Routine X 2) w Reflex to ID Panel     Status: None (Preliminary result)   Collection Time: 06/03/24  9:10 PM   Specimen: BLOOD LEFT HAND  Result Value Ref Range Status   Specimen Description BLOOD LEFT HAND  Final   Special Requests   Final    BOTTLES DRAWN AEROBIC AND ANAEROBIC Blood Culture adequate volume   Culture   Final    NO GROWTH 2 DAYS Performed at [facility], [address] [place], [place] [address]    Report Status PENDING  Incomplete  Culture, blood (Routine X 2) w Reflex to ID Panel     Status: None (Preliminary result)   Collection Time: 06/04/24  4:43 AM   Specimen: BLOOD LEFT HAND  Result Value Ref Range Status   Specimen Description BLOOD LEFT HAND  Final   Special Requests   Final    BOTTLES DRAWN AEROBIC AND ANAEROBIC Blood Culture adequate volume   Culture   Final    NO GROWTH 2 DAYS Performed at [facility], [address], [place], [place] [address]    Report Status PENDING   Incomplete    Radiology Studies: No results found.   Scheduled Meds:  aspirin  EC  81 mg Oral Daily   Chlorhexidine  Gluconate Cloth  6 each Topical Daily   feeding supplement  237 mL Oral BID BM   furosemide   40 mg Intravenous BID   hydrocortisone  sod succinate (SOLU-CORTEF ) inj  100 mg Intravenous Q12H   tamsulosin   0.4 mg Oral BID   Continuous Infusions:  cefTRIAXone  (ROCEPHIN )  IV Stopped (06/05/24 1737)   dextrose  30 mL/hr at 06/06/24 0700   norepinephrine  (LEVOPHED ) Adult infusion Stopped (06/04/24 1857)     LOS: 3 days    Time: 50 minutes   Eric Nunnery, MD [facility]   To contact the attending provider between 7A-7P or the covering provider during after hours 7P-7A, please log into the web site [email or web address] and access using universal [place] password for that web site. If you do not have the password, please call the hospital operator.  06/06/2024, 11:47 AM    "

## 2024-06-06 NOTE — Consult Note (Signed)
 "                                                                                   Consultation Note Date: 06/06/2024   Patient Name: Guy Roberson  DOB: 05-25-1932  MRN: [id number]  Age / Sex: 89 y.o., male  PCP: [name], MD Referring Physician: Ricky Fines, MD  Reason for Consultation: Establishing goals of care  HPI/Patient Profile: 89 y.o. male  with past medical history of HFmrEF, CKD stage 3b, HTN, DM, complete heart block s/p pacemaker admitted on 06/03/2024 with weight gain with acute on chronic heart failure exacerbation. Hospitalization complicated by UTI and encephalopathy.   Clinical Assessment and Goals of Care: Consult received and chart review completed. Reviewed previous hospitalization similar to current hospitalization with significant decline and concern for approach of end of life. Palliative care followed during that admission with conversations with family considering comfort care. He was ultimately d/c to SNF rehab with some level of improvement and able to return home with support from family. Discussed with Dr. Ricky and RN.   [name] is lying in bed. No family currently at bedside. Mr. Herbers easily arouses to verbal stimuli. He tracks and attempts to speak but unable to communicate. Voice becomes wheezy and unable to communicate. He becomes agitated trying to communicate. I reassured him that he is ill, at [facility], and that his family has been and is aware that he is here. He calmed and I left him to rest.   I called and discussed with son and daughter-in-law. They recall conversations with palliative care last hospital stay. They express understanding of his declining health and that each illness it becomes more difficult for him to improve and takes more out of his body. We discussed goals of care and overall prognosis. They would like more time to see how he responds to current treatment. They are open to transition to comfort if he continues to  decline. They wish to assess what makes the most sense for him on a daily basis. We did discuss concern for underlying vascular dementia, hospital delirium, CHF, and UTI all contributing to significant encephalopathy. I reviewed with them medications and that he has received no medications that have added to his encephalopathy. We agree to continue current care for now and continue conversations. Family plans to be at bedside again tomorrow closer to midday and we will meet and discuss again.   All questions/concerns addressed to the best of my ability. Emotional support provided.   Primary Decision Maker NEXT OF KIN    SUMMARY OF RECOMMENDATIONS   - DNR/DNI - Ongoing palliative support and discussions regarding plan of care and path forward  Code Status/Advance Care Planning: DNR   Symptom Management:  Per attending  Prognosis:  Poor - high risk for further decline  Discharge Planning: To Be Determined      Primary Diagnoses: Present on Admission:  Acute on chronic diastolic CHF (congestive heart failure) (HCC)  CKD stage 3a, GFR 45-59 ml/min (HCC)  Complete heart block (HCC)  Essential hypertension  Pacemaker-[employer]tronic  Thrombocytopenia  Type 2 diabetes mellitus with other circulatory complications (HCC)  Urinary  tract infection without hematuria  Acute metabolic encephalopathy   I have reviewed the medical record, interviewed the patient and family, and examined the patient. The following aspects are pertinent.  Past Medical History:  Diagnosis Date   Arthritis    CAD (coronary artery disease)    a.  90% LAD stenosis in 1995 treated with PTCA;  b. 09/2009 CABG x 4: LIMA->LAD, VG->Diag, VG->OM, VG->PDA;  c. 11/2011 Cath: 3VD, 4/4 patent grafts, EF 70%.   Chronic constipation    Colonic polyp    Complete heart block (HCC)    a. 11/2011 s/p MDT Adapta L ADDRL 1 Ser # [id number] H.   Complication of anesthesia    PROBLEMS VOIDING AFTER PREVIOUS SURGERIES   Diabetes  mellitus with vascular complication (HCC)    NOT ON ANY DIABETIC MEDICATIONS   GERD (gastroesophageal reflux disease)    Hyperlipidemia    Hypertension    Pacemaker 11/2011   DR.  G.TAYLOR AND DR. DOROTHA RAKERS   Pseudophakia of both eyes    Seizures (HCC)    a. prior to diagnosis of heart block and syncope, this was in the differential and pt was briefly on Keppra , started by ER MD.   Syncope    a. 11/2011 18 second pauses noted on Event Monitor assoc w/ syncope   Trauma 1952   Abdominal and chest in 1952; multiple injuries including pelvic fracture, rib fractures and ruptured bladder   Urinary retention    PT HAS INDWELLING FOLEY CATHETER   Social History   Socioeconomic History   Marital status: Married    Spouse name: Not on file   Number of children: Not on file   Years of education: Not on file   Highest education level: Not on file  Occupational History   Occupation: Retired  Tobacco Use   Smoking status: Never   Smokeless tobacco: Current    Types: Chew  Vaping Use   Vaping status: Never Used  Substance and Sexual Activity   Alcohol use: No   Drug use: No   Sexual activity: Yes  Other Topics Concern   Not on file  Social History Narrative   Not on file   Social Drivers of Health   Tobacco Use: High Risk (06/03/2024)   Patient History    Smoking Tobacco Use: Never    Smokeless Tobacco Use: Current    Passive Exposure: Not on file  Financial Resource Strain: Not on file  Food Insecurity: Patient Unable To Answer (06/04/2024)   Epic    Worried About Programme Researcher, Broadcasting/film/video in the Last Year: Patient unable to answer    Ran Out of Food in the Last Year: Patient unable to answer  Transportation Needs: Patient Unable To Answer (06/04/2024)   Epic    Lack of Transportation (Medical): Patient unable to answer    Lack of Transportation (Non-Medical): Patient unable to answer  Physical Activity: Not on file  Stress: Not on file  Social Connections: Patient Unable To Answer  (06/04/2024)   Social Connection and Isolation Panel    Frequency of Communication with Friends and Family: Patient unable to answer    Frequency of Social Gatherings with Friends and Family: Patient unable to answer    Attends Religious Services: Patient unable to answer    Active Member of Clubs or Organizations: Patient unable to answer    Attends Banker Meetings: Patient unable to answer    Marital Status: Patient unable to answer  Depression (PHQ2-9): Low  Risk (08/17/2022)   Depression (PHQ2-9)    PHQ-2 Score: 0  Alcohol Screen: Not on file  Housing: Patient Unable To Answer (06/04/2024)   Epic    Unable to Pay for Housing in the Last Year: Patient unable to answer    Number of Times Moved in the Last Year: Not on file    Homeless in the Last Year: Patient unable to answer  Utilities: Patient Unable To Answer (06/04/2024)   Epic    Threatened with loss of utilities: Patient unable to answer  Health Literacy: Not on file   Family History  Problem Relation Age of Onset   Heart block Mother    Heart murmur Mother    Aneurysm Father    Coronary artery disease Brother        x3   Lung cancer Brother    Scheduled Meds:  aspirin  EC  81 mg Oral Daily   Chlorhexidine  Gluconate Cloth  6 each Topical Daily   feeding supplement  237 mL Oral BID BM   furosemide   40 mg Intravenous BID   hydrocortisone  sod succinate (SOLU-CORTEF ) inj  100 mg Intravenous Q12H   tamsulosin   0.4 mg Oral BID   Continuous Infusions:  cefTRIAXone  (ROCEPHIN )  IV Stopped (06/05/24 1737)   dextrose  30 mL/hr at 06/06/24 0700   norepinephrine  (LEVOPHED ) Adult infusion Stopped (06/04/24 1857)   PRN Meds:.acetaminophen  **OR** acetaminophen , ipratropium-albuterol , ondansetron  **OR** ondansetron  (ZOFRAN ) IV, mouth rinse, polyethylene glycol Allergies[1] Review of Systems  Unable to perform ROS: Acuity of condition    Physical Exam Vitals and nursing note reviewed.  Constitutional:      Appearance:  He is ill-appearing.  Cardiovascular:     Rate and Rhythm: Normal rate.  Pulmonary:     Effort: No tachypnea.     Comments: Labored when trying to communicate Abdominal:     Palpations: Abdomen is soft.  Neurological:     Mental Status: He is easily aroused. He is confused.     Vital Signs: BP (!) 126/53   Pulse 87   Temp 99 F (37.2 C)   Resp 14   Ht 5' 7 (1.702 m)   Wt 82 kg   SpO2 94%   BMI 28.31 kg/m  Pain Scale: 0-10 POSS *See Group Information*: 2-Acceptable,Slightly drowsy, easily aroused Pain Score: Asleep   SpO2: SpO2: 94 % O2 Device:SpO2: 94 % O2 Flow Rate: .O2 Flow Rate (L/min): 2 L/min  IO: Intake/output summary:  Intake/Output Summary (Last 24 hours) at 06/06/2024 0855 Last data filed at 06/06/2024 0700 Gross per 24 hour  Intake 803.2 ml  Output 1900 ml  Net -1096.8 ml    LBM: Last BM Date : 06/05/24 Baseline Weight: Weight: 80.7 kg Most recent weight: Weight: 82 kg       Time Total: 60 min  Signed by: Bernarda Kitty, NP Palliative Medicine Team Pager # [phone] (M-F 8a-5p) Team Phone # [phone] (Nights/Weekends)                     [1]  Allergies Allergen Reactions   Other Other (See Comments)    All sleep aids Makes him crazy, hallucinations, vivid dreams   Penicillins Other (See Comments)    Unknown  Childhood allergy Disorder of immune function   "

## 2024-06-07 DIAGNOSIS — I5033 Acute on chronic diastolic (congestive) heart failure: Secondary | ICD-10-CM | POA: Diagnosis not present

## 2024-06-07 DIAGNOSIS — Z515 Encounter for palliative care: Secondary | ICD-10-CM | POA: Diagnosis not present

## 2024-06-07 DIAGNOSIS — Z7189 Other specified counseling: Secondary | ICD-10-CM | POA: Diagnosis not present

## 2024-06-07 DIAGNOSIS — G9341 Metabolic encephalopathy: Secondary | ICD-10-CM | POA: Diagnosis not present

## 2024-06-07 LAB — BASIC METABOLIC PANEL WITH GFR
Anion gap: 7 (ref 5–15)
BUN: 44 mg/dL — ABNORMAL HIGH (ref 8–23)
CO2: 34 mmol/L — ABNORMAL HIGH (ref 22–32)
Calcium: 8.9 mg/dL (ref 8.9–10.3)
Chloride: 102 mmol/L (ref 98–111)
Creatinine, Ser: 1.81 mg/dL — ABNORMAL HIGH (ref 0.61–1.24)
GFR, Estimated: 35 mL/min — ABNORMAL LOW
Glucose, Bld: 143 mg/dL — ABNORMAL HIGH (ref 70–99)
Potassium: 2.7 mmol/L — CL (ref 3.5–5.1)
Sodium: 144 mmol/L (ref 135–145)

## 2024-06-07 LAB — GLUCOSE, CAPILLARY
Glucose-Capillary: 145 mg/dL — ABNORMAL HIGH (ref 70–99)
Glucose-Capillary: 152 mg/dL — ABNORMAL HIGH (ref 70–99)
Glucose-Capillary: 160 mg/dL — ABNORMAL HIGH (ref 70–99)
Glucose-Capillary: 162 mg/dL — ABNORMAL HIGH (ref 70–99)
Glucose-Capillary: 176 mg/dL — ABNORMAL HIGH (ref 70–99)
Glucose-Capillary: 188 mg/dL — ABNORMAL HIGH (ref 70–99)

## 2024-06-07 LAB — MAGNESIUM: Magnesium: 2.1 mg/dL (ref 1.7–2.4)

## 2024-06-07 MED ORDER — POTASSIUM CHLORIDE 10 MEQ/100ML IV SOLN
10.0000 meq | INTRAVENOUS | Status: AC
  Administered 2024-06-07 (×4): 10 meq via INTRAVENOUS
  Filled 2024-06-07 (×4): qty 100

## 2024-06-07 MED ORDER — CIPROFLOXACIN HCL 250 MG PO TABS
250.0000 mg | ORAL_TABLET | Freq: Two times a day (BID) | ORAL | Status: DC
  Administered 2024-06-07 – 2024-06-08 (×2): 250 mg via ORAL
  Filled 2024-06-07 (×2): qty 1

## 2024-06-07 MED ORDER — POTASSIUM CHLORIDE CRYS ER 20 MEQ PO TBCR
40.0000 meq | EXTENDED_RELEASE_TABLET | Freq: Once | ORAL | Status: DC
  Filled 2024-06-07: qty 2

## 2024-06-07 MED ORDER — METOPROLOL TARTRATE 25 MG PO TABS: 25.0000 mg | ORAL_TABLET | Freq: Two times a day (BID) | ORAL | Status: DC

## 2024-06-07 MED ORDER — POTASSIUM CHLORIDE 10 MEQ/100ML IV SOLN
10.0000 meq | INTRAVENOUS | Status: AC
  Administered 2024-06-07 (×3): 10 meq via INTRAVENOUS
  Filled 2024-06-07 (×3): qty 100

## 2024-06-07 NOTE — Plan of Care (Signed)
  Problem: Clinical Measurements: Goal: Respiratory complications will improve Outcome: Progressing Goal: Cardiovascular complication will be avoided Outcome: Progressing   Problem: Activity: Goal: Risk for activity intolerance will decrease Outcome: Not Progressing   Problem: Nutrition: Goal: Adequate nutrition will be maintained Outcome: Not Progressing   

## 2024-06-07 NOTE — Progress Notes (Signed)
 " PROGRESS NOTE    Guy Roberson  FMW:[id number] DOB: [birth year] DOA: 06/03/2024  PCP: [name], MD    Chief Complaint  Patient presents with   Shortness of Breath    Brief admission narrative:  As per H&P written by Dr. Pearlean on 06/04/2023 Guy Roberson is a 89 y.o. male with medical history significant for CHF, CKD, hypertension, diabetes mellitus, complete heart block pacemaker status. Patient presented to the ED with complaints of weight gain of 8 pounds over the past 1 week, with abdominal distention.  Bilateral lower extremity edema is chronic and at baseline.  Son also reports that patient's speech was confused earlier today-saying things that did not make sense.  He has been compliant with Lasix  40-60 mg daily.   Hospitalized 10/28 to 11/7 also for CHF, diuresed with IV Lasix .  proBNP 3949.   ED Course: O2 sats greater than 95% on room air.  Heart rate 50s to 60s.  Respiratory rate 13-28.  Temperature 98.  Blood pressure systolic 92-117. UA with large leukocytes. Creatinine 1.85. Platelets 76. Chest x-ray shows bibasilar opacities-atelectasis and/or effusion. Head CT negative for acute abnormality. IV Lasix  80 mg x 1 given.    Assessment & Plan:  Acute on chronic diastolic CHF (congestive heart failure) (HCC) -Has been having recurrent issues recently. -Continue IV diuresis as blood pressure will allow - Follow daily weights/strict intake and output - Continue to follow clinical response - Monitor renal function and electrolytes closely.  Potassium low today will replace  Hyperkalemia Potassium 2.7, will replace, will check magnesium  as well Patient not able to take p.o. safely.  Will continue IV replacement  Acute metabolic encephalopathy/hospital-acquired delirium -In the setting of UTI most likely - Continue constant orientation - Continue IV antibiotics and supportive care. - Avoid sedative agents and continue constant reorientation  Urinary tract  infection without hematuria Patient with recurrent UTIs - Suppression antibiotic given at baseline - Both urine and blood culture negative.  Will DC IV ceftriaxone , start suppression with ciprofloxacin .  Type 2 diabetes mellitus with other circulatory complications (HCC) -Experiencing hypoglycemia - Continue to follow CBG fluctuation. - Cortisol within normal limits.  Essential hypertension - Was on Levophed , currently off.  Complete heart block (HCC) -Status post pacemaker implantation - Continue telemetry monitoring - Follow electrolytes stability and follow  CKD stage 3B baseline GFR results. -Appears to be stable - Continue to follow renal function trend - Continue to minimize nephrotoxic agents  Thrombocytopenia - Probably associated with acute infection - No overt bleeding appreciated on exam - Follow platelet counts trend and continue minimizing/avoiding the use of heparin  products.  Social/ethics - Patient DNR/DNI -Overall condition/prognosis guarded - Palliative care has been consulted and will follow further recommendations. Current goal is to continue current management with hopes of improvement.  No escalation of care.  DVT prophylaxis: SCDs Code Status: DNR/DNI. Family Communication: daughter in law Disposition:   Status is: Inpatient Remains inpatient appropriate because: Continue IV therapy.   Consultants:  None  Procedures:  See below for x-ray reports.  Antimicrobials:  Ceftriaxone    Subjective: Patient was seen and examined earlier at today.  He was lethargic.  Was able to tell his name and went back to sleep immediately.  Vital signs are stable, blood pressure in 100 systolic, paced rhythm.  He was on some supplemental oxygen.   K low at 2.7.  Will replace IV  Objective: Vitals:   06/07/24 0303 06/07/24 0422 06/07/24 0500 06/07/24 0700  BP: (!) 104/33 (!) 107/32  (!) 101/37  Pulse: 65 60  60  Resp: 11 11  10   Temp: 98.1 F (36.7 C) (!)  97.5 F (36.4 C)  (!) 96.8 F (36 C)  TempSrc:      SpO2: 96% 96%  95%  Weight:   75 kg   Height:        Intake/Output Summary (Last 24 hours) at 06/07/2024 1655 Last data filed at 06/07/2024 0400 Gross per 24 hour  Intake 50 ml  Output 1675 ml  Net -1625 ml   Filed Weights   06/06/24 0446 06/06/24 0800 06/07/24 0500  Weight: 82 kg 82 kg 75 kg    Examination: General: Lethargic, arousable Chest: Mild crackles bilaterally, no wheezing CVS: S1, S2, regular, paced in telemetry Abdomen: Soft, nontender Extremities: no edema Data Reviewed: I have personally reviewed following labs and imaging studies  CBC: Recent Labs  Lab 06/03/24 1416 06/03/24 2110  WBC 6.2 6.2  NEUTROABS 5.0 4.9  HGB 10.6* 10.9*  HCT 33.4* 35.5*  MCV 92.3 93.2  PLT 76* 78*    Basic Metabolic Panel: Recent Labs  Lab 06/03/24 2110 06/04/24 0443 06/05/24 0441 06/06/24 0439 06/07/24 0440 06/07/24 0846  NA 134* 135 138 142 144  --   K 5.8* 5.4* 4.6 3.5 2.7*  --   CL 103 102 101 102 102  --   CO2 18* 24 25 28  34*  --   GLUCOSE 110* 101* 96 96 143*  --   BUN 46* 48* 48* 45* 44*  --   CREATININE 1.81* 1.91* 2.27* 2.06* 1.81*  --   CALCIUM  9.9 9.4 9.4 9.5 8.9  --   MG 2.3  --   --   --   --  2.1    GFR: Estimated Creatinine Clearance: 25.4 mL/min (A) (by C-G formula based on SCr of 1.81 mg/dL (H)).  Liver Function Tests: Recent Labs  Lab 06/03/24 1416 06/03/24 2110  AST 28 27  ALT 27 27  ALKPHOS 99 103  BILITOT 0.3 0.3  PROT 6.3* 6.4*  ALBUMIN 3.9 4.0    CBG: Recent Labs  Lab 06/06/24 2026 06/06/24 2343 06/07/24 0339 06/07/24 0736 06/07/24 1140  GLUCAP 151* 164* 160* 152* 176*    Recent Results (from the past 240 hours)  Urine Culture     Status: None   Collection Time: 06/03/24  2:16 PM   Specimen: Urine, Random  Result Value Ref Range Status   Specimen Description   Final    URINE, RANDOM Performed at [facility], [address] [place], [place] [address]     Special Requests   Final    NONE Reflexed from [id number] Performed at [facility], [address] [place], [place] [address]    Culture   Final    NO GROWTH Performed at [facility], [address], [place], [place] [address]    Report Status 06/05/2024 FINAL  Final  Resp panel by RT-PCR (RSV, Flu A&B, Covid) Anterior Nasal Swab     Status: None   Collection Time: 06/03/24  2:28 PM   Specimen: Anterior Nasal Swab  Result Value Ref Range Status   SARS Coronavirus 2 by RT PCR NEGATIVE NEGATIVE Final    Comment: (NOTE) SARS-CoV-2 target nucleic acids are NOT DETECTED.  The SARS-CoV-2 RNA is generally detectable in upper respiratory specimens during the acute phase of infection. The lowest concentration of SARS-CoV-2 viral copies this assay can detect is 138 copies/mL.  A negative result does not preclude SARS-Cov-2 infection and should not be used as the sole basis for treatment or other patient management decisions. A negative result may occur with  improper specimen collection/handling, submission of specimen other than nasopharyngeal swab, presence of viral mutation(s) within the areas targeted by this assay, and inadequate number of viral copies(<138 copies/mL). A negative result must be combined with clinical observations, patient history, and epidemiological information. The expected result is Negative.  Fact Sheet for Patients:  bloggercourse.com  Fact Sheet for Healthcare Providers:  seriousbroker.it  This test is no t yet approved or cleared by the [place]  FDA and  has been authorized for detection and/or diagnosis of SARS-CoV-2 by FDA under an Emergency Use Authorization (EUA). This EUA will remain  in effect (meaning this test can be used) for the duration of the COVID-19 declaration under Section 564(b)(1) of the Act, 21 U.S.C.section 360bbb-3(b)(1), unless the authorization is terminated  or revoked sooner.        Influenza A by PCR NEGATIVE NEGATIVE Final   Influenza B by PCR NEGATIVE NEGATIVE Final    Comment: (NOTE) The Xpert Xpress SARS-CoV-2/FLU/RSV plus assay is intended as an aid in the diagnosis of influenza from Nasopharyngeal swab specimens and should not be used as a sole basis for treatment. Nasal washings and aspirates are unacceptable for Xpert Xpress SARS-CoV-2/FLU/RSV testing.  Fact Sheet for Patients: bloggercourse.com  Fact Sheet for Healthcare Providers: seriousbroker.it  This test is not yet approved or cleared by the [place]  FDA and has been authorized for detection and/or diagnosis of SARS-CoV-2 by FDA under an Emergency Use Authorization (EUA). This EUA will remain in effect (meaning this test can be used) for the duration of the COVID-19 declaration under Section 564(b)(1) of the Act, 21 U.S.C. section 360bbb-3(b)(1), unless the authorization is terminated or revoked.     Resp Syncytial Virus by PCR NEGATIVE NEGATIVE Final    Comment: (NOTE) Fact Sheet for Patients: bloggercourse.com  Fact Sheet for Healthcare Providers: seriousbroker.it  This test is not yet approved or cleared by the [place]  FDA and has been authorized for detection and/or diagnosis of SARS-CoV-2 by FDA under an Emergency Use Authorization (EUA). This EUA will remain in effect (meaning this test can be used) for the duration of the COVID-19 declaration under Section 564(b)(1) of the Act, 21 U.S.C. section 360bbb-3(b)(1), unless the authorization is terminated or revoked.  Performed at [facility], [address] [place], [place] [address]   MRSA Next Gen by PCR, Nasal     Status: None   Collection Time: 06/03/24  9:00 PM   Specimen: Nasal Mucosa; Nasal Swab  Result Value Ref Range Status   MRSA by PCR Next Gen NOT DETECTED NOT DETECTED Final    Comment: (NOTE) The  GeneXpert MRSA Assay (FDA approved for NASAL specimens only), is one component of a comprehensive MRSA colonization surveillance program. It is not intended to diagnose MRSA infection nor to guide or monitor treatment for MRSA infections. Test performance is not FDA approved in patients less than 55 years old. Performed at [facility], [address] [place], [place] [address]   Culture, blood (Routine X 2) w Reflex to ID Panel     Status: None (Preliminary result)   Collection Time: 06/03/24  9:10 PM   Specimen: BLOOD LEFT HAND  Result Value Ref Range Status   Specimen Description BLOOD LEFT HAND  Final   Special Requests   Final    BOTTLES DRAWN  AEROBIC AND ANAEROBIC Blood Culture adequate volume   Culture   Final    NO GROWTH 3 DAYS Performed at [facility], [address] [place], [place] [address]    Report Status PENDING  Incomplete  Culture, blood (Routine X 2) w Reflex to ID Panel     Status: None (Preliminary result)   Collection Time: 06/04/24  4:43 AM   Specimen: BLOOD LEFT HAND  Result Value Ref Range Status   Specimen Description BLOOD LEFT HAND  Final   Special Requests   Final    BOTTLES DRAWN AEROBIC AND ANAEROBIC Blood Culture adequate volume   Culture   Final    NO GROWTH 3 DAYS Performed at [facility], [address], [place], [place] [address]    Report Status PENDING  Incomplete    Radiology Studies: No results found.   Scheduled Meds:  aspirin  EC  81 mg Oral Daily   Chlorhexidine  Gluconate Cloth  6 each Topical Daily   feeding supplement  237 mL Oral BID BM   furosemide   40 mg Intravenous BID   hydrocortisone  sod succinate (SOLU-CORTEF ) inj  100 mg Intravenous Q12H   potassium chloride   40 mEq Oral Once   tamsulosin   0.4 mg Oral BID   Continuous Infusions:  cefTRIAXone  (ROCEPHIN )  IV 2 g (06/06/24 1626)   dextrose  30 mL/hr at 06/06/24 0700     LOS: 4 days    Time: 50 minutes   Derryl Duval, MD [facility]   To contact the  attending provider between 7A-7P or the covering provider during after hours 7P-7A, please log into the web site [email or web address] and access using universal [place] password for that web site. If you do not have the password, please call the hospital operator.  06/07/2024, 4:55 PM    "

## 2024-06-07 NOTE — Progress Notes (Signed)
 Palliative:  HPI: 89 y.o. male  with past medical history of HFmrEF, CKD stage 3b, HTN, DM, complete heart block s/p pacemaker admitted on 06/03/2024 with weight gain with acute on chronic heart failure exacerbation. Hospitalization complicated by UTI and encephalopathy.   I received update from RN regarding status and changes from yesterday. No significant changes. Still somnolent and unable to converse or awake enough to receive intake. I discussed with son and daughter-in-law, Joell and [name], at bedside. Mr. Vora lives at home with his wife but Diane and [name] live next door. Wife is reported to be in bad health herself and with cognitive impairment per family. Nicholus is an only child. They have caregivers in the home to assist both Mr. and Mrs. [place] 20 hours a day. He relies on caregivers and is not compliant with recommended exercises from therapy. He prefers using wheelchair as he has fear of falling.   We reviewed his status with ongoing somnolence and unable to eat/drink. He is hypotensive and hypokalemic. Family report that they have seen him in similar condition previously. They were close to pursuing hospice and then he went to rehab and started eating and responding to therapy. They are hesitant to transition to comfort too soon as he has had improvement before when not expected. They wish to continue with current treatment at this time. I did spend time educating and discussing the risks vs benefits of central line, vasopressor support, and feeding tubes and they ultimately agree these are forms of life support that Mr. Shellhammer would not want. They are open to hospice options in the absence of improvement or with further decline. They are open to consideration of what is best for Mr. Lewellen on a daily basis.   All questions/concerns addressed. Emotional support provided.   Exam: Somnolent. Does not follow commands. Does track. HR paced. BP soft. Breathing regular, unlabored at rest with nasal  cannula.   Plan: - DNR/DNI - Continue current care - No plans to pursue central line placement, vasopressors, or feeding tubes after discussion today - Time for outcomes  55 min  Bernarda Kitty, NP Palliative Medicine Team Pager [phone] (Please see [email or web address] for schedule) Team Phone [phone]

## 2024-06-07 NOTE — Plan of Care (Signed)

## 2024-06-08 DIAGNOSIS — Z515 Encounter for palliative care: Secondary | ICD-10-CM | POA: Diagnosis not present

## 2024-06-08 DIAGNOSIS — Z7189 Other specified counseling: Secondary | ICD-10-CM | POA: Diagnosis not present

## 2024-06-08 DIAGNOSIS — G9341 Metabolic encephalopathy: Secondary | ICD-10-CM | POA: Diagnosis not present

## 2024-06-08 DIAGNOSIS — I5033 Acute on chronic diastolic (congestive) heart failure: Secondary | ICD-10-CM | POA: Diagnosis not present

## 2024-06-08 LAB — BASIC METABOLIC PANEL WITH GFR
Anion gap: 7 (ref 5–15)
BUN: 46 mg/dL — ABNORMAL HIGH (ref 8–23)
CO2: 33 mmol/L — ABNORMAL HIGH (ref 22–32)
Calcium: 8.8 mg/dL — ABNORMAL LOW (ref 8.9–10.3)
Chloride: 102 mmol/L (ref 98–111)
Creatinine, Ser: 1.76 mg/dL — ABNORMAL HIGH (ref 0.61–1.24)
GFR, Estimated: 36 mL/min — ABNORMAL LOW
Glucose, Bld: 127 mg/dL — ABNORMAL HIGH (ref 70–99)
Potassium: 3.5 mmol/L (ref 3.5–5.1)
Sodium: 142 mmol/L (ref 135–145)

## 2024-06-08 LAB — GLUCOSE, CAPILLARY
Glucose-Capillary: 141 mg/dL — ABNORMAL HIGH (ref 70–99)
Glucose-Capillary: 135 mg/dL — ABNORMAL HIGH (ref 70–99)
Glucose-Capillary: 154 mg/dL — ABNORMAL HIGH (ref 70–99)
Glucose-Capillary: 180 mg/dL — ABNORMAL HIGH (ref 70–99)
Glucose-Capillary: 168 mg/dL — ABNORMAL HIGH (ref 70–99)
Glucose-Capillary: 203 mg/dL — ABNORMAL HIGH (ref 70–99)

## 2024-06-08 MED ORDER — CIPROFLOXACIN HCL 250 MG PO TABS
250.0000 mg | ORAL_TABLET | ORAL | Status: DC
  Administered 2024-06-09 – 2024-06-16 (×8): 250 mg via ORAL
  Filled 2024-06-08 (×8): qty 1

## 2024-06-08 MED ORDER — ALBUMIN HUMAN 25 % IV SOLN
12.5000 g | Freq: Once | INTRAVENOUS | Status: AC
  Administered 2024-06-08: 12.5 g via INTRAVENOUS
  Filled 2024-06-08: qty 50

## 2024-06-08 MED ORDER — MIDODRINE HCL 5 MG PO TABS
5.0000 mg | ORAL_TABLET | Freq: Three times a day (TID) | ORAL | Status: DC
  Administered 2024-06-08 – 2024-06-09 (×4): 5 mg via ORAL
  Filled 2024-06-08 (×4): qty 1

## 2024-06-08 MED ORDER — POTASSIUM CHLORIDE 20 MEQ PO PACK
40.0000 meq | PACK | Freq: Once | ORAL | Status: AC
  Administered 2024-06-08: 40 meq via ORAL
  Filled 2024-06-08: qty 2

## 2024-06-08 MED ORDER — POTASSIUM CHLORIDE CRYS ER 20 MEQ PO TBCR: 40.0000 meq | EXTENDED_RELEASE_TABLET | Freq: Once | ORAL | Status: DC

## 2024-06-08 NOTE — Progress Notes (Signed)
 Palliative:  HPI: 89 y.o. male  with past medical history of HFmrEF, CKD stage 3b, HTN, DM, complete heart block s/p pacemaker admitted on 06/03/2024 with weight gain with acute on chronic heart failure exacerbation. Hospitalization complicated by UTI and encephalopathy.    I met today at Guy Roberson bedside. He awakens and tracks. Still not able to speak to me. RN reports he was able to take his pills but did not eat any breakfast. Blood pressure still soft. No family at bedside.   Update: I missed family at bedside. SLP is working with Guy Roberson and was able to get him to eat some lunch. Speaking some with SLP this afternoon. He remains stable with perhaps some improvement. No signs of further decline today. Time for outcomes. Goals remain clear.   Exam: Resting comfortably. No distress. Awakens easily and able to track. Poor reserve. Breathing regular, unlabored. BP soft. Warm to touch.   Plan: - DNR/DNI - Continue current care - No plans to pursue central line placement, vasopressors, or feeding tubes - Time for outcomes  25 min  Bernarda Kitty, NP Palliative Medicine Team Pager [phone] (Please see [email or web address] for schedule) Team Phone [phone]

## 2024-06-08 NOTE — Progress Notes (Signed)
 Speech Language Pathology Treatment: Dysphagia  Patient Details Name: Guy Roberson MRN: [id number] DOB: 1932-05-21 Today's Date: 06/08/2024 Time: 8789-8768 SLP Time Calculation (min) (ACUTE ONLY): 21 min  Assessment / Plan / Recommendation Clinical Impression  Recommend continue current diet (Dysphagia 1(puree/thin) d/t fatigue during po consumption with swallowing strategies in place and posted for small bites/sips while pt is alert/responsive with occasional liquid wash via tsp and/or single cup sips only.  ST will f/u for diet progression/dysphagia tx/education during acute stay.  Pt seen for dysphagia tx/education with pt mentation improved this session as pt was answering questions and following directives during lunch tray consumption with SLP.  Pt nodded in agreement when swallowing strategies discussed including small bites/sips, slow rate and thins provided intermittently with tsp and/or single cup sips, but no straw.  Pt did exhibit a delayed strong cough post intake of successive thin liquid swallows likely d/t deconditioning/pharyngeal weakness.  Continue current diet with swallow precautions in place and ST will continue to f/u in acute setting.  HPI HPI: Guy Roberson is a 89 y.o. male with medical history significant for CHF, CKD, hypertension, diabetes mellitus, complete heart block pacemaker status. Patient presented to the ED on 06/03/24 with complaints of weight gain of 8 pounds over the past 1 week, with abdominal distention. Bilateral lower extremity edema is chronic and at baseline. Son also reports that patient's speech was confused earlier today-saying things that did not make sense. He has been compliant with Lasix  40-60 mg daily. CXR indicated bibasilar opacities vs atelectasis or effusion. Head CT negative for acute changes. ST consulted for clinical swallow evaluation. Pt currently on Dysphagia 2(minced)/thin liquid diet; BSE yielded need for Dysphagia 1/thin with ST f/u  for diet progression/dysphagia tx/education during acute stay.      SLP Plan  Continue with current plan of care        Swallow Evaluation Recommendations   Recommendations: PO diet PO Diet Recommendation: Dysphagia 1 (Pureed);Thin liquids (Level 0) Liquid Administration via: Spoon;Cup;Other (Comment) (single sips by cup only) Medication Administration: Crushed with puree Supervision: Full assist for feeding;Full supervision/cueing for swallowing strategies Postural changes: Position pt fully upright for meals Oral care recommendations: Oral care BID (2x/day);Staff/trained caregiver to provide oral care Caregiver Recommendations: Have oral suction available     Recommendations   PO diet (Dysphagia 1(puree)/thin with small single sips via cup and/or use tsp when alert/responsive                  Oral care QID;Staff/trained caregiver to provide oral care   Frequent or constant Supervision/Assistance Dysphagia, unspecified (R13.10)     Continue with current plan of care     Pat Guy Roberson,M.S.,CCC-SLP  06/08/2024, 1:06 PM

## 2024-06-08 NOTE — Plan of Care (Signed)
   Problem: Clinical Measurements: Goal: Respiratory complications will improve Outcome: Progressing Goal: Cardiovascular complication will be avoided Outcome: Progressing

## 2024-06-08 NOTE — Progress Notes (Signed)
 " PROGRESS NOTE    Guy Roberson  FMW:[id number] DOB: 12/04/1931 DOA: 06/03/2024  PCP: [name], MD    Chief Complaint  Patient presents with   Shortness of Breath    Brief admission narrative:  As per H&P written by Dr. Pearlean on 06/04/2023 Guy Roberson is a 89 y.o. male with medical history significant for CHF, severe valvular issues, severe TR, moderate MR, pulmonary hypertension, CKD, hypertension, diabetes mellitus, complete heart block pacemaker status. Patient presented to the ED with complaints of weight gain of 8 pounds over the past 1 week, with abdominal distention.  Bilateral lower extremity edema is chronic and at baseline.  Son also reports that patient's speech was confused earlier today-saying things that did not make sense.  He has been compliant with Lasix  40-60 mg daily.   Hospitalized 10/28 to 11/7 also for CHF, diuresed with IV Lasix .  proBNP 3949.   ED Course: O2 sats greater than 95% on room air.  Heart rate 50s to 60s.  Respiratory rate 13-28.  Temperature 98.  Blood pressure systolic 92-117. UA with large leukocytes. Creatinine 1.85. Platelets 76. Chest x-ray shows bibasilar opacities-atelectasis and/or effusion. Head CT negative for acute abnormality. IV Lasix  80 mg x 1 given.    Assessment & Plan:  Acute on chronic diastolic CHF (congestive heart failure) (HCC) Mild systolic heart failure, EF 50% per echocardiogram echo 2025 Severe MR and hypertension Moderate MR Severe TR -Has been having recurrent issues recently. -Patient was told in the past that he was not a surgical candidate for fixing valves. -Continue IV diuresis as blood pressure will allow, 40 mg IV Lasix  twice daily- Follow daily weights/strict intake and output - Continue to follow clinical response - Monitor renal function and electrolytes closely.  Potassium low today will replace p.o. -Spoke with daughter-in-law 1/8, she would like to continue treatment and see how he does  in the next few days. - Palliative is on board.  Hyperkalemia Replacing as needed.  Was as low as 2.7.  Acute metabolic encephalopathy/hospital-acquired delirium -In the setting of UTI most likely - Continue constant orientation - Continue IV antibiotics and supportive care. - Avoid sedative agents and continue constant reorientation  Urinary tract infection without hematuria Patient with recurrent UTIs - Suppression antibiotic given at baseline - Both urine and blood culture negative.  Ceftriaxone  discontinued, on suppression with Cipro .  Dose change per renal function  Type 2 diabetes mellitus with other circulatory complications (HCC) -Experiencing hypoglycemia - Continue to follow CBG fluctuation. - Cortisol within normal limits.  Essential hypertension - Was on Levophed , currently off. Will add midodrine  today due to borderline low BP.  Hypoglycemia: Continue D10 drip  Complete heart block (HCC) -Status post pacemaker implantation - Continue telemetry monitoring - Follow electrolytes stability and follow  CKD stage 3B baseline GFR results. -Appears to be stable - Continue to follow renal function trend - Continue to minimize nephrotoxic agents  Thrombocytopenia - Probably associated with acute infection - No overt bleeding appreciated on exam - Follow platelet counts trend and continue minimizing/avoiding the use of heparin  products.  Social/ethics - Patient DNR/DNI -Overall condition/prognosis guarded - Palliative care has been consulted and will follow further recommendations. Current goal is to continue current management with hopes of improvement.  No escalation of care.  DVT prophylaxis: SCDs Code Status: DNR/DNI. Family Communication: daughter in law Disposition:   Status is: Inpatient Remains inpatient appropriate because: Continue IV therapy.   Consultants:  None  Procedures:  See below for x-ray reports.  Antimicrobials:   Ceftriaxone    Subjective: Patient was seen and examined earlier at today.  He was sleepy, arousable but fell back to sleep easily.  Blood pressure has been 90s to 100 systolic today.  Diastolic has been low.  Paced rhythm.  He is requiring continued D10 drip.  Objective: Vitals:   06/08/24 0500 06/08/24 0600 06/08/24 1200 06/08/24 1300  BP: (!) 97/29 (!) 96/31 (!) 105/36 (!) 92/24  Pulse: 60 (!) 59 60 60  Resp: 11 12 11 11   Temp: 98.6 F (37 C) 98.4 F (36.9 C) 97.9 F (36.6 C) (!) 97.5 F (36.4 C)  TempSrc:      SpO2: 99% 100% 99% 98%  Weight:      Height:        Intake/Output Summary (Last 24 hours) at 06/08/2024 1436 Last data filed at 06/08/2024 9380 Gross per 24 hour  Intake 1510.38 ml  Output 500 ml  Net 1010.38 ml   Filed Weights   06/06/24 0800 06/07/24 0500 06/08/24 0446  Weight: 82 kg 75 kg 81.8 kg    Examination: General: Lethargic, arousable Chest: Mild crackles bilaterally, no wheezing CVS: S1, S2, regular, paced in telemetry Abdomen: Soft, nontender Extremities: no edema Data Reviewed: I have personally reviewed following labs and imaging studies  CBC: Recent Labs  Lab 06/03/24 1416 06/03/24 2110  WBC 6.2 6.2  NEUTROABS 5.0 4.9  HGB 10.6* 10.9*  HCT 33.4* 35.5*  MCV 92.3 93.2  PLT 76* 78*    Basic Metabolic Panel: Recent Labs  Lab 06/03/24 2110 06/04/24 0443 06/05/24 0441 06/06/24 0439 06/07/24 0440 06/07/24 0846 06/08/24 0442  NA 134* 135 138 142 144  --  142  K 5.8* 5.4* 4.6 3.5 2.7*  --  3.5  CL 103 102 101 102 102  --  102  CO2 18* 24 25 28  34*  --  33*  GLUCOSE 110* 101* 96 96 143*  --  127*  BUN 46* 48* 48* 45* 44*  --  46*  CREATININE 1.81* 1.91* 2.27* 2.06* 1.81*  --  1.76*  CALCIUM  9.9 9.4 9.4 9.5 8.9  --  8.8*  MG 2.3  --   --   --   --  2.1  --     GFR: Estimated Creatinine Clearance: 28.6 mL/min (A) (by C-G formula based on SCr of 1.76 mg/dL (H)).  Liver Function Tests: Recent Labs  Lab 06/03/24 1416  06/03/24 2110  AST 28 27  ALT 27 27  ALKPHOS 99 103  BILITOT 0.3 0.3  PROT 6.3* 6.4*  ALBUMIN  3.9 4.0    CBG: Recent Labs  Lab 06/07/24 1932 06/07/24 2354 06/08/24 0355 06/08/24 0737 06/08/24 1120  GLUCAP 188* 145* 141* 135* 154*    Recent Results (from the past 240 hours)  Urine Culture     Status: None   Collection Time: 06/03/24  2:16 PM   Specimen: Urine, Random  Result Value Ref Range Status   Specimen Description   Final    URINE, RANDOM Performed at [facility], [address], [place], [place] [address]    Special Requests   Final    NONE Reflexed from [id number] Performed at [facility], [address], [place], [place] [address]    Culture   Final    NO GROWTH Performed at [facility], [address], [place], [place] [address]    Report Status 06/05/2024 FINAL  Final  Resp panel by RT-PCR (RSV,  Flu A&B, Covid) Anterior Nasal Swab     Status: None   Collection Time: 06/03/24  2:28 PM   Specimen: Anterior Nasal Swab  Result Value Ref Range Status   SARS Coronavirus 2 by RT PCR NEGATIVE NEGATIVE Final    Comment: (NOTE) SARS-CoV-2 target nucleic acids are NOT DETECTED.  The SARS-CoV-2 RNA is generally detectable in upper respiratory specimens during the acute phase of infection. The lowest concentration of SARS-CoV-2 viral copies this assay can detect is 138 copies/mL. A negative result does not preclude SARS-Cov-2 infection and should not be used as the sole basis for treatment or other patient management decisions. A negative result may occur with  improper specimen collection/handling, submission of specimen other than nasopharyngeal swab, presence of viral mutation(s) within the areas targeted by this assay, and inadequate number of viral copies(<138 copies/mL). A negative result must be combined with clinical observations, patient history, and epidemiological information. The expected result is Negative.  Fact Sheet for Patients:   bloggercourse.com  Fact Sheet for Healthcare Providers:  seriousbroker.it  This test is no t yet approved or cleared by the [place]  FDA and  has been authorized for detection and/or diagnosis of SARS-CoV-2 by FDA under an Emergency Use Authorization (EUA). This EUA will remain  in effect (meaning this test can be used) for the duration of the COVID-19 declaration under Section 564(b)(1) of the Act, 21 U.S.C.section 360bbb-3(b)(1), unless the authorization is terminated  or revoked sooner.       Influenza A by PCR NEGATIVE NEGATIVE Final   Influenza B by PCR NEGATIVE NEGATIVE Final    Comment: (NOTE) The Xpert Xpress SARS-CoV-2/FLU/RSV plus assay is intended as an aid in the diagnosis of influenza from Nasopharyngeal swab specimens and should not be used as a sole basis for treatment. Nasal washings and aspirates are unacceptable for Xpert Xpress SARS-CoV-2/FLU/RSV testing.  Fact Sheet for Patients: bloggercourse.com  Fact Sheet for Healthcare Providers: seriousbroker.it  This test is not yet approved or cleared by the [place]  FDA and has been authorized for detection and/or diagnosis of SARS-CoV-2 by FDA under an Emergency Use Authorization (EUA). This EUA will remain in effect (meaning this test can be used) for the duration of the COVID-19 declaration under Section 564(b)(1) of the Act, 21 U.S.C. section 360bbb-3(b)(1), unless the authorization is terminated or revoked.     Resp Syncytial Virus by PCR NEGATIVE NEGATIVE Final    Comment: (NOTE) Fact Sheet for Patients: bloggercourse.com  Fact Sheet for Healthcare Providers: seriousbroker.it  This test is not yet approved or cleared by the [place]  FDA and has been authorized for detection and/or diagnosis of SARS-CoV-2 by FDA under an Emergency Use  Authorization (EUA). This EUA will remain in effect (meaning this test can be used) for the duration of the COVID-19 declaration under Section 564(b)(1) of the Act, 21 U.S.C. section 360bbb-3(b)(1), unless the authorization is terminated or revoked.  Performed at [facility], [address] [place], [place] [address]   MRSA Next Gen by PCR, Nasal     Status: None   Collection Time: 06/03/24  9:00 PM   Specimen: Nasal Mucosa; Nasal Swab  Result Value Ref Range Status   MRSA by PCR Next Gen NOT DETECTED NOT DETECTED Final    Comment: (NOTE) The GeneXpert MRSA Assay (FDA approved for NASAL specimens only), is one component of a comprehensive MRSA colonization surveillance program. It is not intended to diagnose MRSA infection nor to guide or monitor treatment for MRSA  infections. Test performance is not FDA approved in patients less than 9 years old. Performed at [facility], [address] [place], [place] [address]   Culture, blood (Routine X 2) w Reflex to ID Panel     Status: None (Preliminary result)   Collection Time: 06/03/24  9:10 PM   Specimen: BLOOD LEFT HAND  Result Value Ref Range Status   Specimen Description BLOOD LEFT HAND  Final   Special Requests   Final    BOTTLES DRAWN AEROBIC AND ANAEROBIC Blood Culture adequate volume   Culture   Final    NO GROWTH 4 DAYS Performed at [facility], [address], [place], [place] [address]    Report Status PENDING  Incomplete  Culture, blood (Routine X 2) w Reflex to ID Panel     Status: None (Preliminary result)   Collection Time: 06/04/24  4:43 AM   Specimen: BLOOD LEFT HAND  Result Value Ref Range Status   Specimen Description BLOOD LEFT HAND  Final   Special Requests   Final    BOTTLES DRAWN AEROBIC AND ANAEROBIC Blood Culture adequate volume   Culture   Final    NO GROWTH 4 DAYS Performed at [facility], [address], [place], [place] [address]    Report Status PENDING  Incomplete    Radiology Studies: No  results found.   Scheduled Meds:  aspirin  EC  81 mg Oral Daily   Chlorhexidine  Gluconate Cloth  6 each Topical Daily   [START ON 06/09/2024] ciprofloxacin   250 mg Oral Q24H   feeding supplement  237 mL Oral BID BM   furosemide   40 mg Intravenous BID   hydrocortisone  sod succinate (SOLU-CORTEF ) inj  100 mg Intravenous Q12H   midodrine   5 mg Oral TID WC   potassium chloride   40 mEq Oral Once   tamsulosin   0.4 mg Oral BID   Continuous Infusions:  dextrose  30 mL/hr at 06/08/24 0336     LOS: 5 days    Time: 50 minutes   Derryl Duval, MD [facility]   To contact the attending provider between 7A-7P or the covering provider during after hours 7P-7A, please log into the web site [email or web address] and access using universal [place] password for that web site. If you do not have the password, please call the hospital operator.  06/08/2024, 2:36 PM    "

## 2024-06-09 DIAGNOSIS — I5033 Acute on chronic diastolic (congestive) heart failure: Secondary | ICD-10-CM | POA: Diagnosis not present

## 2024-06-09 LAB — GLUCOSE, CAPILLARY
Glucose-Capillary: 163 mg/dL — ABNORMAL HIGH (ref 70–99)
Glucose-Capillary: 186 mg/dL — ABNORMAL HIGH (ref 70–99)
Glucose-Capillary: 228 mg/dL — ABNORMAL HIGH (ref 70–99)
Glucose-Capillary: 200 mg/dL — ABNORMAL HIGH (ref 70–99)
Glucose-Capillary: 195 mg/dL — ABNORMAL HIGH (ref 70–99)

## 2024-06-09 LAB — CBC WITH DIFFERENTIAL/PLATELET
Abs Immature Granulocytes: 0.03 K/uL (ref 0.00–0.07)
Basophils Absolute: 0 K/uL (ref 0.0–0.1)
Basophils Relative: 0 %
Eosinophils Absolute: 0 K/uL (ref 0.0–0.5)
Eosinophils Relative: 0 %
HCT: 29.4 % — ABNORMAL LOW (ref 39.0–52.0)
Hemoglobin: 9.1 g/dL — ABNORMAL LOW (ref 13.0–17.0)
Immature Granulocytes: 1 %
Lymphocytes Relative: 11 %
Lymphs Abs: 0.7 K/uL (ref 0.7–4.0)
MCH: 28.9 pg (ref 26.0–34.0)
MCHC: 31 g/dL (ref 30.0–36.0)
MCV: 93.3 fL (ref 80.0–100.0)
Monocytes Absolute: 0.7 K/uL (ref 0.1–1.0)
Monocytes Relative: 10 %
Neutro Abs: 5.2 K/uL (ref 1.7–7.7)
Neutrophils Relative %: 78 %
Platelets: 79 K/uL — ABNORMAL LOW (ref 150–400)
RBC: 3.15 MIL/uL — ABNORMAL LOW (ref 4.22–5.81)
RDW: 15.9 % — ABNORMAL HIGH (ref 11.5–15.5)
WBC: 6.6 K/uL (ref 4.0–10.5)
nRBC: 0 % (ref 0.0–0.2)

## 2024-06-09 LAB — BASIC METABOLIC PANEL WITH GFR
Anion gap: 15 (ref 5–15)
BUN: 49 mg/dL — ABNORMAL HIGH (ref 8–23)
CO2: 25 mmol/L (ref 22–32)
Calcium: 8.8 mg/dL — ABNORMAL LOW (ref 8.9–10.3)
Chloride: 101 mmol/L (ref 98–111)
Creatinine, Ser: 1.62 mg/dL — ABNORMAL HIGH (ref 0.61–1.24)
GFR, Estimated: 40 mL/min — ABNORMAL LOW
Glucose, Bld: 163 mg/dL — ABNORMAL HIGH (ref 70–99)
Potassium: 3.5 mmol/L (ref 3.5–5.1)
Sodium: 141 mmol/L (ref 135–145)

## 2024-06-09 LAB — CULTURE, BLOOD (ROUTINE X 2)
Culture: NO GROWTH
Culture: NO GROWTH
Special Requests: ADEQUATE
Special Requests: ADEQUATE

## 2024-06-09 LAB — MAGNESIUM: Magnesium: 2.3 mg/dL (ref 1.7–2.4)

## 2024-06-09 MED ORDER — FUROSEMIDE 10 MG/ML IJ SOLN
40.0000 mg | Freq: Every day | INTRAMUSCULAR | Status: DC
  Administered 2024-06-10 – 2024-06-11 (×2): 40 mg via INTRAVENOUS
  Filled 2024-06-09 (×2): qty 4

## 2024-06-09 MED ORDER — MIDODRINE HCL 5 MG PO TABS
10.0000 mg | ORAL_TABLET | Freq: Three times a day (TID) | ORAL | Status: DC
  Administered 2024-06-10 – 2024-06-16 (×20): 10 mg via ORAL
  Filled 2024-06-09 (×20): qty 2

## 2024-06-09 NOTE — Plan of Care (Signed)
" °  Problem: Education: Goal: Knowledge of General Education information will improve Description: Including pain rating scale, medication(s)/side effects and non-pharmacologic comfort measures Outcome: Not Progressing   Problem: Health Behavior/Discharge Planning: Goal: Ability to manage health-related needs will improve Outcome: Not Progressing   Problem: Clinical Measurements: Goal: Ability to maintain clinical measurements within normal limits will improve Outcome: Not Progressing Goal: Will remain free from infection Outcome: Not Progressing Goal: Diagnostic test results will improve Outcome: Not Progressing Goal: Respiratory complications will improve Outcome: Not Progressing Goal: Cardiovascular complication will be avoided Outcome: Progressing   Problem: Activity: Goal: Risk for activity intolerance will decrease Outcome: Not Progressing   Problem: Nutrition: Goal: Adequate nutrition will be maintained Outcome: Progressing   Problem: Coping: Goal: Level of anxiety will decrease Outcome: Not Progressing   Problem: Elimination: Goal: Will not experience complications related to bowel motility Outcome: Progressing Goal: Will not experience complications related to urinary retention Outcome: Progressing   Problem: Pain Managment: Goal: General experience of comfort will improve and/or be controlled Outcome: Not Progressing   Problem: Safety: Goal: Ability to remain free from injury will improve Outcome: Not Progressing   Problem: Skin Integrity: Goal: Risk for impaired skin integrity will decrease Outcome: Not Progressing   Problem: Education: Goal: Ability to demonstrate management of disease process will improve Outcome: Not Progressing Goal: Ability to verbalize understanding of medication therapies will improve Outcome: Not Progressing Goal: Individualized Educational Video(s) Outcome: Not Progressing   Problem: Activity: Goal: Capacity to carry out  activities will improve Outcome: Not Progressing   Problem: Cardiac: Goal: Ability to achieve and maintain adequate cardiopulmonary perfusion will improve Outcome: Progressing   "

## 2024-06-09 NOTE — Progress Notes (Addendum)
 " PROGRESS NOTE    Guy Roberson  FMW:[id number] DOB: [birth year] DOA: 06/03/2024  PCP: Guy Roberson PEDLAR, MD    Chief Complaint  Patient presents with   Shortness of Breath    Brief admission narrative:  As per H&P written by Dr. Pearlean on 06/04/2023 [place] Guy Roberson is a 89 y.o. male with medical history significant for CHF, severe valvular issues, severe TR, moderate MR, pulmonary hypertension, CKD, hypertension, diabetes mellitus, complete heart block pacemaker status. Patient presented to the ED with complaints of weight gain of 8 pounds over the past 1 week, with abdominal distention.  Bilateral lower extremity edema is chronic and at baseline.  Son also reports that patient's speech was confused earlier today-saying things that did not make sense.  He has been compliant with Lasix  40-60 mg daily.   Hospitalized 10/28 to 11/7 also for CHF, diuresed with IV Lasix .  proBNP 3949.   ED Course: O2 sats greater than 95% on room air.  Heart rate 50s to 60s.  Respiratory rate 13-28.  Temperature 98.  Blood pressure systolic 92-117. UA with large leukocytes. Creatinine 1.85. Platelets 76. Chest x-ray shows bibasilar opacities-atelectasis and/or effusion. Head CT negative for acute abnormality. IV Lasix  80 mg x 1 given.    Assessment & Plan:  Acute on chronic diastolic CHF (congestive heart failure) (HCC) Mild systolic heart failure, EF 50% per echocardiogram echo 2025 Severe MR and hypertension Moderate MR Severe TR -Has been having recurrent issues recently. -Patient was told in the past that he was not a surgical candidate for fixing valves. -Continue IV diuresis as blood pressure will allow, will decrease lasix  to daily 40 mg IV  - Follow daily weights/strict intake and output. BP has been borderline low - Continue to follow clinical response - Monitor renal function and electrolytes closely.   -Spoke with daughter-in-law 1/8, she would like to continue treatment and see how he  does in the next few days. - Palliative is on board.  Hyperkalemia Replacing as needed.  Was as low as 2.7.  Acute metabolic encephalopathy/hospital-acquired delirium -In the setting of UTI most likely - Continue constant orientation - Completed IV antibiotics for likely UTI, currently on suppression Cipro  - Avoid sedative agents and continue constant reorientation  Urinary tract infection without hematuria Patient with recurrent UTIs - Suppression antibiotic given at baseline - Both urine and blood culture negative.  Ceftriaxone  discontinued, on suppression with Cipro .  Dose change per renal function  Type 2 diabetes mellitus with other circulatory complications (HCC) -Experiencing hypoglycemia, continue D10 drip - Continue to follow CBG fluctuation. - Cortisol within normal limits.  Essential hypertension - Was on Levophed , currently off. Midodrine  added 1/8 due to borderline low BP.  Received IV albumin  1/8  Hypoglycemia: Continue D10 drip  Complete heart block (HCC) -Status post pacemaker implantation - Continue telemetry monitoring - Follow electrolytes stability and follow  CKD stage 3B baseline GFR results. -Appears to be stable - Continue to follow renal function trend - Continue to minimize nephrotoxic agents  Thrombocytopenia - Probably associated with acute infection - No overt bleeding appreciated on exam - Follow platelet counts trend and continue minimizing/avoiding the use of heparin  products.  Social/ethics - Patient DNR/DNI -Overall condition/prognosis guarded - Palliative care has been consulted and will follow further recommendations. Current goal is to continue current management with hopes of improvement.  No escalation of care.  DVT prophylaxis: SCDs Code Status: DNR/DNI. Family Communication: daughter in law 1/9.  Wants to continue current  management  Disposition:   Status is: Inpatient Remains inpatient, continuing diuresis, remains  hypotensive requiring close management.  consultants:  None  Procedures:  See below for x-ray reports.  Antimicrobials:  Ceftriaxone    Subjective: Patient was seen and examined earlier at today.  He was sleepy, arousable, more interactive than yesterday.  He purposefully pulled at my badge to see my name   objective: Vitals:   06/09/24 0800 06/09/24 0919 06/09/24 1000 06/09/24 1215  BP: (!) 101/45 121/60 (!) 95/38 (!) 96/31  Pulse: 60 (!) 57 (!) 59 66  Resp: (!) 22 15 14 18   Temp: (!) 95.7 F (35.4 C) (!) 95.9 F (35.5 C) (!) 95.5 F (35.3 C) (!) 95.7 F (35.4 C)  TempSrc:      SpO2: 100% 100% 93% 97%  Weight:      Height:        Intake/Output Summary (Last 24 hours) at 06/09/2024 1237 Last data filed at 06/09/2024 1100 Gross per 24 hour  Intake 1364.38 ml  Output 1100 ml  Net 264.38 ml   Filed Weights   06/07/24 0500 06/08/24 0446 06/09/24 0537  Weight: 75 kg 81.8 kg 82.5 kg    Examination: General: Lethargic, arousable Chest: Mild crackles bilaterally, no wheezing CVS: S1, S2, regular, paced in telemetry Abdomen: Soft, nontender Extremities: no edema Data Reviewed: I have personally reviewed following labs and imaging studies  CBC: Recent Labs  Lab 06/03/24 1416 06/03/24 2110 06/09/24 0539  WBC 6.2 6.2 6.6  NEUTROABS 5.0 4.9 5.2  HGB 10.6* 10.9* 9.1*  HCT 33.4* 35.5* 29.4*  MCV 92.3 93.2 93.3  PLT 76* 78* 79*    Basic Metabolic Panel: Recent Labs  Lab 06/03/24 2110 06/04/24 0443 06/05/24 0441 06/06/24 0439 06/07/24 0440 06/07/24 0846 06/08/24 0442 06/09/24 0539  NA 134*   < > 138 142 144  --  142 141  K 5.8*   < > 4.6 3.5 2.7*  --  3.5 3.5  CL 103   < > 101 102 102  --  102 101  CO2 18*   < > 25 28 34*  --  33* 25  GLUCOSE 110*   < > 96 96 143*  --  127* 163*  BUN 46*   < > 48* 45* 44*  --  46* 49*  CREATININE 1.81*   < > 2.27* 2.06* 1.81*  --  1.76* 1.62*  CALCIUM  9.9   < > 9.4 9.5 8.9  --  8.8* 8.8*  MG 2.3  --   --   --   --  2.1   --  2.3   < > = values in this interval not displayed.    GFR: Estimated Creatinine Clearance: 31.2 mL/min (A) (by C-G formula based on SCr of 1.62 mg/dL (H)).  Liver Function Tests: Recent Labs  Lab 06/03/24 1416 06/03/24 2110  AST 28 27  ALT 27 27  ALKPHOS 99 103  BILITOT 0.3 0.3  PROT 6.3* 6.4*  ALBUMIN  3.9 4.0    CBG: Recent Labs  Lab 06/08/24 1943 06/08/24 2317 06/09/24 0337 06/09/24 0723 06/09/24 1142  GLUCAP 168* 203* 163* 186* 228*    Recent Results (from the past 240 hours)  Urine Culture     Status: None   Collection Time: 06/03/24  2:16 PM   Specimen: Urine, Random  Result Value Ref Range Status   Specimen Description   Final    URINE, RANDOM Performed at [facility], [address] [place], [place]  [address]    Special Requests   Final    NONE Reflexed from [id number] Performed at [facility], [address] [place], [place] [address]    Culture   Final    NO GROWTH Performed at [facility], [address] [place], [place] [address]    Report Status 06/05/2024 FINAL  Final  Resp panel by RT-PCR (RSV, Flu A&B, Covid) Anterior Nasal Swab     Status: None   Collection Time: 06/03/24  2:28 PM   Specimen: Anterior Nasal Swab  Result Value Ref Range Status   SARS Coronavirus 2 by RT PCR NEGATIVE NEGATIVE Final    Comment: (NOTE) SARS-CoV-2 target nucleic acids are NOT DETECTED.  The SARS-CoV-2 RNA is generally detectable in upper respiratory specimens during the acute phase of infection. The lowest concentration of SARS-CoV-2 viral copies this assay can detect is 138 copies/mL. A negative result does not preclude SARS-Cov-2 infection and should not be used as the sole basis for treatment or other patient management decisions. A negative result may occur with  improper specimen collection/handling, submission of specimen other than nasopharyngeal swab, presence of viral mutation(s) within the areas targeted by this assay, and inadequate  number of viral copies(<138 copies/mL). A negative result must be combined with clinical observations, patient history, and epidemiological information. The expected result is Negative.  Fact Sheet for Patients:  bloggercourse.com  Fact Sheet for Healthcare Providers:  seriousbroker.it  This test is no t yet approved or cleared by the [place]  FDA and  has been authorized for detection and/or diagnosis of SARS-CoV-2 by FDA under an Emergency Use Authorization (EUA). This EUA will remain  in effect (meaning this test can be used) for the duration of the COVID-19 declaration under Section 564(b)(1) of the Act, 21 U.S.C.section 360bbb-3(b)(1), unless the authorization is terminated  or revoked sooner.       Influenza A by PCR NEGATIVE NEGATIVE Final   Influenza B by PCR NEGATIVE NEGATIVE Final    Comment: (NOTE) The Xpert Xpress SARS-CoV-2/FLU/RSV plus assay is intended as an aid in the diagnosis of influenza from Nasopharyngeal swab specimens and should not be used as a sole basis for treatment. Nasal washings and aspirates are unacceptable for Xpert Xpress SARS-CoV-2/FLU/RSV testing.  Fact Sheet for Patients: bloggercourse.com  Fact Sheet for Healthcare Providers: seriousbroker.it  This test is not yet approved or cleared by the [place]  FDA and has been authorized for detection and/or diagnosis of SARS-CoV-2 by FDA under an Emergency Use Authorization (EUA). This EUA will remain in effect (meaning this test can be used) for the duration of the COVID-19 declaration under Section 564(b)(1) of the Act, 21 U.S.C. section 360bbb-3(b)(1), unless the authorization is terminated or revoked.     Resp Syncytial Virus by PCR NEGATIVE NEGATIVE Final    Comment: (NOTE) Fact Sheet for Patients: bloggercourse.com  Fact Sheet for Healthcare  Providers: seriousbroker.it  This test is not yet approved or cleared by the [place]  FDA and has been authorized for detection and/or diagnosis of SARS-CoV-2 by FDA under an Emergency Use Authorization (EUA). This EUA will remain in effect (meaning this test can be used) for the duration of the COVID-19 declaration under Section 564(b)(1) of the Act, 21 U.S.C. section 360bbb-3(b)(1), unless the authorization is terminated or revoked.  Performed at [facility], [address], [place], [place] [address]   MRSA Next Gen by PCR, Nasal     Status: None   Collection Time: 06/03/24  9:00  PM   Specimen: Nasal Mucosa; Nasal Swab  Result Value Ref Range Status   MRSA by PCR Next Gen NOT DETECTED NOT DETECTED Final    Comment: (NOTE) The GeneXpert MRSA Assay (FDA approved for NASAL specimens only), is one component of a comprehensive MRSA colonization surveillance program. It is not intended to diagnose MRSA infection nor to guide or monitor treatment for MRSA infections. Test performance is not FDA approved in patients less than 8 years old. Performed at [facility], [address] [place], [place] [address]   Culture, blood (Routine X 2) w Reflex to ID Panel     Status: None   Collection Time: 06/03/24  9:10 PM   Specimen: BLOOD LEFT HAND  Result Value Ref Range Status   Specimen Description BLOOD LEFT HAND  Final   Special Requests   Final    BOTTLES DRAWN AEROBIC AND ANAEROBIC Blood Culture adequate volume   Culture   Final    NO GROWTH 5 DAYS Performed at [facility], [address], [place], [place] [address]    Report Status 06/09/2024 FINAL  Final  Culture, blood (Routine X 2) w Reflex to ID Panel     Status: None   Collection Time: 06/04/24  4:43 AM   Specimen: BLOOD LEFT HAND  Result Value Ref Range Status   Specimen Description BLOOD LEFT HAND  Final   Special Requests   Final    BOTTLES DRAWN AEROBIC AND ANAEROBIC Blood Culture adequate  volume   Culture   Final    NO GROWTH 5 DAYS Performed at [facility], [address] [place], [place] [address]    Report Status 06/09/2024 FINAL  Final    Radiology Studies: No results found.   Scheduled Meds:  aspirin  EC  81 mg Oral Daily   Chlorhexidine  Gluconate Cloth  6 each Topical Daily   ciprofloxacin   250 mg Oral Q24H   feeding supplement  237 mL Oral BID BM   furosemide   40 mg Intravenous BID   hydrocortisone  sod succinate (SOLU-CORTEF ) inj  100 mg Intravenous Q12H   midodrine   5 mg Oral TID WC   potassium chloride   40 mEq Oral Once   tamsulosin   0.4 mg Oral BID   Continuous Infusions:  dextrose  30 mL/hr at 06/09/24 0713     LOS: 6 days    Time: 50 minutes   Derryl Duval, MD [facility]   To contact the attending provider between 7A-7P or the covering provider during after hours 7P-7A, please log into the web site [email or web address] and access using universal [place] password for that web site. If you do not have the password, please call the hospital operator.  06/09/2024, 12:37 PM    "

## 2024-06-09 NOTE — Progress Notes (Signed)
 Speech Language Pathology Treatment: Dysphagia  Patient Details Name: Guy Roberson MRN: [id number] DOB: April 15, 1932 Today's Date: 06/09/2024 Time: 1012-1022 SLP Time Calculation (min) (ACUTE ONLY): 10 min  Assessment / Plan / Recommendation Clinical Impression  Pt with improved oral swallow at this time, demonstrating timely and complete mastication and oral clearance of soft solids with no cues needed. No overt s/sx aspiration with solid textures. Min cues needed to avoid sequential sips of thin liquids which does elicit delayed cough response. No cough with cued single sips via straw. Vital signs stable throughout PO trials. Able to advance diet to dysphagia 2 with thin liquids. Will continue to follow for tolerance and advancement.    HPI HPI: Guy Roberson is a 89 y.o. male with medical history significant for CHF, CKD, hypertension, diabetes mellitus, complete heart block pacemaker status. Patient presented to the ED on 06/03/24 with complaints of weight gain of 8 pounds over the past 1 week, with abdominal distention. Bilateral lower extremity edema is chronic and at baseline. Son also reports that patient's speech was confused earlier today-saying things that did not make sense. He has been compliant with Lasix  40-60 mg daily. CXR indicated bibasilar opacities vs atelectasis or effusion. Head CT negative for acute changes. ST consulted for clinical swallow evaluation. Pt currently on Dysphagia 2(minced)/thin liquid diet; BSE yielded need for Dysphagia 1/thin with ST f/u for diet progression/dysphagia tx/education during acute stay.      SLP Plan  Continue with current plan of care        Swallow Evaluation Recommendations   Recommendations: PO diet PO Diet Recommendation: Dysphagia 2 (Finely chopped);Thin liquids (Level 0) Liquid Administration via: Straw Medication Administration: Crushed with puree Supervision: Staff to assist with self-feeding Postural changes: Position pt  fully upright for meals Oral care recommendations: Oral care BID (2x/day)     Recommendations   Dysphagia 2 solids, thin liquids, assist for feeding, aspiration precautions       Aspiration precautions: small sips/bites, allow extra time to swallow, only feed when alert           Oral care QID;Staff/trained caregiver to provide oral care   Frequent or constant Supervision/Assistance Dysphagia, unspecified (R13.10)     Continue with current plan of care     Guy Roberson  06/09/2024, 10:22 AM

## 2024-06-10 DIAGNOSIS — I5033 Acute on chronic diastolic (congestive) heart failure: Secondary | ICD-10-CM | POA: Diagnosis not present

## 2024-06-10 LAB — GLUCOSE, CAPILLARY
Glucose-Capillary: 119 mg/dL — ABNORMAL HIGH (ref 70–99)
Glucose-Capillary: 148 mg/dL — ABNORMAL HIGH (ref 70–99)
Glucose-Capillary: 157 mg/dL — ABNORMAL HIGH (ref 70–99)
Glucose-Capillary: 170 mg/dL — ABNORMAL HIGH (ref 70–99)
Glucose-Capillary: 177 mg/dL — ABNORMAL HIGH (ref 70–99)
Glucose-Capillary: 230 mg/dL — ABNORMAL HIGH (ref 70–99)
Glucose-Capillary: 283 mg/dL — ABNORMAL HIGH (ref 70–99)

## 2024-06-10 LAB — BASIC METABOLIC PANEL WITH GFR
Anion gap: 8 (ref 5–15)
BUN: 59 mg/dL — ABNORMAL HIGH (ref 8–23)
CO2: 31 mmol/L (ref 22–32)
Calcium: 8.9 mg/dL (ref 8.9–10.3)
Chloride: 98 mmol/L (ref 98–111)
Creatinine, Ser: 1.75 mg/dL — ABNORMAL HIGH (ref 0.61–1.24)
GFR, Estimated: 37 mL/min — ABNORMAL LOW
Glucose, Bld: 159 mg/dL — ABNORMAL HIGH (ref 70–99)
Potassium: 3.5 mmol/L (ref 3.5–5.1)
Sodium: 138 mmol/L (ref 135–145)

## 2024-06-10 NOTE — Evaluation (Signed)
 Physical Therapy Evaluation Patient Details Name: Guy Roberson MRN: [id number] DOB: 1932-05-03 Today's Date: 06/10/2024  History of Present Illness  Per MD 6 days ago:Guy Roberson is a 89 y.o. male with medical history significant for CHF, CKD, hypertension, diabetes mellitus, complete heart block pacemaker status.  Patient presented to the ED with complaints of weight gain of 8 pounds over the past 1 week, with abdominal distention.  Bilateral lower extremity edema is chronic and at baseline.                                                                                       Today, 06/10/24,  pt appears to be at his baseline confusion and there is no noted edema in his legs.  Clinical Impression  PT states that he does not walk anymore but does transfer.  PT legs buckled once during transfer to chair where pt took 6 small steps.  PT states he has an caregiver at home that exercises with him.  Pt will benefit from [facility] services.         If plan is discharge home, recommend the following: A little help with walking and/or transfers;A lot of help with bathing/dressing/bathroom;Assistance with cooking/housework;Assist for transportation;Help with stairs or ramp for entrance   Can travel by private vehicle    yes    Equipment Recommendations None recommended by PT  Recommendations for Other Services   none    Functional Status Assessment Patient has had a recent decline in their functional status and demonstrates the ability to make significant improvements in function in a reasonable and predictable amount of time.     Precautions / Restrictions Precautions Precautions: Fall Recall of Precautions/Restrictions: Impaired Restrictions Weight Bearing Restrictions Per Provider Order: No      Mobility  Bed Mobility Overal bed mobility: Needs Assistance Bed Mobility: Supine to Sit     Supine to sit: Min assist          Transfers Overall transfer level: Needs  assistance Equipment used: Rolling walker (2 wheels) Transfers: Sit to/from Stand, Bed to chair/wheelchair/BSC Sit to Stand: Min assist Stand pivot transfers: Min assist         General transfer comment: PT legs buckled once, however, pt was able to catch himself.    Ambulation/Gait Ambulation/Gait assistance: Min assist Gait Distance (Feet): 3 Feet Assistive device: Rolling walker (2 wheels) Gait Pattern/deviations: Decreased step length - left, Decreased stance time - right            Pertinent Vitals/Pain Pain Assessment Pain Assessment: No/denies pain    Home Living Family/patient expects to be discharged to:: Private residence Living Arrangements: Spouse/significant other Available Help at Discharge: Family;Available PRN/intermittently Type of Home: House Home Access: Ramped entrance       Home Layout: One level Home Equipment: Rollator (4 wheels);Rolling Walker (2 wheels);Cane - single point;Wheelchair - manual Additional Comments: PT is normally confused information from chart review.    Prior Function Prior Level of Function : Needs assist       Physical Assist : Mobility (physical);ADLs (physical) Mobility (physical): Bed mobility;Transfers;Gait;Stairs   Mobility Comments: Pt. able to get to EOB  with RW, needs assist to transfer.  PT states he has not ambulated in a long time. ADLs Comments: Unable to independently do self-care and ADLs due to confusion, family and caregiver assist with needs     Extremity/Trunk Assessment        Lower Extremity Assessment Lower Extremity Assessment: Generalized weakness    Cervical / Trunk Assessment Cervical / Trunk Assessment: Lordotic  Communication   Communication Communication: Impaired Factors Affecting Communication: Hearing impaired;Other (comment)    Cognition Arousal: Alert Behavior During Therapy: WFL for tasks assessed/performed   PT - Cognitive impairments: History of cognitive  impairments                         Following commands: Intact       Cueing Cueing Techniques: Verbal cues, Gestural cues     General Comments      Exercises General Exercises - Lower Extremity Long Arc Quad: AROM, Both, 10 reps Heel Slides: AROM, Both, 10 reps Hip ABduction/ADduction: AROM, Both, 10 reps Straight Leg Raises: AROM, 5 reps Hip Flexion/Marching: Seated, 10 reps   Assessment/Plan    PT Assessment Patient needs continued PT services  PT Problem List Decreased strength;Decreased activity tolerance;Decreased mobility       PT Treatment Interventions Gait training;Therapeutic exercise;Functional mobility training    PT Goals (Current goals can be found in the Care Plan section)  Acute Rehab PT Goals Patient Stated Goal: PT states that he has a caregiver wants to go home with wife. PT Goal Formulation: Patient unable to participate in goal setting Time For Goal Achievement: 06/12/24 Potential to Achieve Goals: Good    Frequency Min 2X/week     Co-evaluation               AM-PAC PT 6 Clicks Mobility  Outcome Measure Help needed turning from your back to your side while in a flat bed without using bedrails?: A Little Help needed moving from lying on your back to sitting on the side of a flat bed without using bedrails?: A Little Help needed moving to and from a bed to a chair (including a wheelchair)?: A Little Help needed standing up from a chair using your arms (e.g., wheelchair or bedside chair)?: A Little Help needed to walk in hospital room?: A Lot Help needed climbing 3-5 steps with a railing? : A Lot 6 Click Score: 16    End of Session Equipment Utilized During Treatment: Gait belt Activity Tolerance: Patient tolerated treatment well Patient left: in chair;with chair alarm set;with call bell/phone within reach Nurse Communication: Mobility status PT Visit Diagnosis: Unsteadiness on feet (R26.81);Muscle weakness (generalized)  (M62.81)    Time: 8985-8960 PT Time Calculation (min) (ACUTE ONLY): 25 min   Charges:   PT Evaluation $PT Eval Low Complexity: 1 Low PT Treatments $Gait Training: 8-22 mins          Montie Metro, PT CLT [phone]  06/10/2024, 10:43 AM

## 2024-06-10 NOTE — Progress Notes (Signed)
 " PROGRESS NOTE    Guy Roberson  FMW:[id number] DOB: Mar 07, 1932 DOA: 06/03/2024  PCP: [name], MD    Chief Complaint  Patient presents with   Shortness of Breath    Brief admission narrative:  As per H&P written by Dr. Pearlean on 06/04/2023 Guy Roberson is a 89 y.o. male with medical history significant for CHF, severe valvular issues, severe TR, moderate MR, pulmonary hypertension, CKD, hypertension, diabetes mellitus, complete heart block pacemaker status. Patient presented to the ED with complaints of weight gain of 8 pounds over the past 1 week, with abdominal distention.  Bilateral lower extremity edema is chronic and at baseline.  Son also reports that patient's speech was confused earlier today-saying things that did not make sense.  He has been compliant with Lasix  40-60 mg daily.   Hospitalized 10/28 to 11/7 also for CHF, diuresed with IV Lasix .  proBNP 3949.   ED Course: O2 sats greater than 95% on room air.  Heart rate 50s to 60s.  Respiratory rate 13-28.  Temperature 98.  Blood pressure systolic 92-117. UA with large leukocytes. Creatinine 1.85. Platelets 76. Chest x-ray shows bibasilar opacities-atelectasis and/or effusion. Head CT negative for acute abnormality. IV Lasix  80 mg x 1 given.    Assessment & Plan:  Acute on chronic diastolic CHF (congestive heart failure) (HCC) Mild systolic heart failure, EF 50% per echocardiogram echo 2025 Severe MR and hypertension Moderate MR Severe TR -Has been having recurrent issues recently. -Patient was told in the past that he was not a surgical candidate for fixing valves. -Continue IV diuresis as blood pressure will allow, will keep lasix  to daily 40 mg IV today and switch to po in the am - Follow daily weights/strict intake and output. BP has been borderline low - Continue to follow clinical response - Monitor renal function and electrolytes closely.   -Spoke with daughter-in-law 1/9, she would like to  continue treatment and see how he does in the next few days.  She is encouraged to see improvement in the past few days but understands his chronic issues and risk of frequent hospitalization. - Palliative is on board.  Hyperkalemia Replacing as needed.  Was as low as 2.7.  Acute metabolic encephalopathy/hospital-acquired delirium -In the setting of UTI most likely - Continue constant orientation - Completed IV antibiotics for likely UTI, currently on suppression Cipro  - Avoid sedative agents and continue constant reorientation  Urinary tract infection without hematuria Patient with recurrent UTIs - Suppression antibiotic given at baseline - Both urine and blood culture negative.  Ceftriaxone  discontinued, on suppression with Cipro .  Dose change per renal function  Type 2 diabetes mellitus with other circulatory complications (HCC) -Experiencing hypoglycemia, continue D10 drip - Continue to follow CBG fluctuation. - Cortisol within normal limits.  Essential hypertension - Was on Levophed , currently off. Midodrine  added 1/8 due to borderline low BP.  Will continue 10 mg 3 times daily.  Received IV albumin  1/8  Hypoglycemia: Was on D10 drip for many days, discontinued 1/9, blood sugars improving with oral intake.  Complete heart block (HCC) -Status post pacemaker implantation - Continue telemetry monitoring - Follow electrolytes stability and follow  CKD stage 3B baseline GFR results. -Appears to be stable - Continue to follow renal function trend - Continue to minimize nephrotoxic agents  Thrombocytopenia - Probably associated with acute infection - No overt bleeding appreciated on exam - Follow platelet counts trend and continue minimizing/avoiding the use of heparin  products.  Social/ethics - Patient  DNR/DNI -Overall condition/prognosis guarded - Palliative care has been consulted and will follow further recommendations. Current goal is to continue current management  with hopes of improvement.  No escalation of care.  DVT prophylaxis: SCDs Code Status: DNR/DNI. Family Communication: daughter in law 1/9.  Wants to continue current management  Disposition:   Status is: Inpatient Remains inpatient, continuing diuresis, remains hypotensive requiring close management.  consultants:  None  Procedures:  See below for x-ray reports.  Antimicrobials:  Ceftriaxone    Subjective: Patient was seen and examined earlier at today.  He was sleepy, arousable, more interactive than yesterday.  He purposefully pulled at my badge to see my name   objective: Vitals:   06/10/24 1100 06/10/24 1131 06/10/24 1300 06/10/24 1400  BP: (!) 112/51  (!) 115/37 (!) 113/42  Pulse: (!) 59  (!) 59 62  Resp: 12  12 18   Temp:  98 F (36.7 C) (!) 96.6 F (35.9 C) (!) 96.3 F (35.7 C)  TempSrc:  Axillary Bladder   SpO2: 97%  100% 100%  Weight:      Height:        Intake/Output Summary (Last 24 hours) at 06/10/2024 1416 Last data filed at 06/10/2024 1234 Gross per 24 hour  Intake 550.5 ml  Output 1250 ml  Net -699.5 ml   Filed Weights   06/08/24 0446 06/09/24 0537 06/10/24 0500  Weight: 81.8 kg 82.5 kg 81.5 kg    Examination: General: Alert, conversant but confused  chest: Mild crackles bilaterally, no wheezing CVS: S1, S2, regular, paced in telemetry Abdomen: Soft, nontender Extremities: no edema Data Reviewed: I have personally reviewed following labs and imaging studies  CBC: Recent Labs  Lab 06/03/24 2110 06/09/24 0539  WBC 6.2 6.6  NEUTROABS 4.9 5.2  HGB 10.9* 9.1*  HCT 35.5* 29.4*  MCV 93.2 93.3  PLT 78* 79*    Basic Metabolic Panel: Recent Labs  Lab 06/03/24 2110 06/04/24 0443 06/06/24 0439 06/07/24 0440 06/07/24 0846 06/08/24 0442 06/09/24 0539 06/10/24 0326  NA 134*   < > 142 144  --  142 141 138  K 5.8*   < > 3.5 2.7*  --  3.5 3.5 3.5  CL 103   < > 102 102  --  102 101 98  CO2 18*   < > 28 34*  --  33* 25 31  GLUCOSE 110*    < > 96 143*  --  127* 163* 159*  BUN 46*   < > 45* 44*  --  46* 49* 59*  CREATININE 1.81*   < > 2.06* 1.81*  --  1.76* 1.62* 1.75*  CALCIUM  9.9   < > 9.5 8.9  --  8.8* 8.8* 8.9  MG 2.3  --   --   --  2.1  --  2.3  --    < > = values in this interval not displayed.    GFR: Estimated Creatinine Clearance: 28.7 mL/min (A) (by C-G formula based on SCr of 1.75 mg/dL (H)).  Liver Function Tests: Recent Labs  Lab 06/03/24 2110  AST 27  ALT 27  ALKPHOS 103  BILITOT 0.3  PROT 6.4*  ALBUMIN  4.0    CBG: Recent Labs  Lab 06/09/24 2005 06/10/24 0050 06/10/24 0316 06/10/24 0732 06/10/24 1114  GLUCAP 195* 170* 157* 148* 177*    Recent Results (from the past 240 hours)  Urine Culture     Status: None   Collection Time: 06/03/24  2:16 PM   Specimen:  Urine, Random  Result Value Ref Range Status   Specimen Description   Final    URINE, RANDOM Performed at [facility], [address], [place], [place] [address]    Special Requests   Final    NONE Reflexed from [id number] Performed at [facility], [address] [place], [place] [address]    Culture   Final    NO GROWTH Performed at [facility], [address] [place], [place] [address]    Report Status 06/05/2024 FINAL  Final  Resp panel by RT-PCR (RSV, Flu A&B, Covid) Anterior Nasal Swab     Status: None   Collection Time: 06/03/24  2:28 PM   Specimen: Anterior Nasal Swab  Result Value Ref Range Status   SARS Coronavirus 2 by RT PCR NEGATIVE NEGATIVE Final    Comment: (NOTE) SARS-CoV-2 target nucleic acids are NOT DETECTED.  The SARS-CoV-2 RNA is generally detectable in upper respiratory specimens during the acute phase of infection. The lowest concentration of SARS-CoV-2 viral copies this assay can detect is 138 copies/mL. A negative result does not preclude SARS-Cov-2 infection and should not be used as the sole basis for treatment or other patient management decisions. A negative result may occur with  improper  specimen collection/handling, submission of specimen other than nasopharyngeal swab, presence of viral mutation(s) within the areas targeted by this assay, and inadequate number of viral copies(<138 copies/mL). A negative result must be combined with clinical observations, patient history, and epidemiological information. The expected result is Negative.  Fact Sheet for Patients:  bloggercourse.com  Fact Sheet for Healthcare Providers:  seriousbroker.it  This test is no t yet approved or cleared by the [place]  FDA and  has been authorized for detection and/or diagnosis of SARS-CoV-2 by FDA under an Emergency Use Authorization (EUA). This EUA will remain  in effect (meaning this test can be used) for the duration of the COVID-19 declaration under Section 564(b)(1) of the Act, 21 U.S.C.section 360bbb-3(b)(1), unless the authorization is terminated  or revoked sooner.       Influenza A by PCR NEGATIVE NEGATIVE Final   Influenza B by PCR NEGATIVE NEGATIVE Final    Comment: (NOTE) The Xpert Xpress SARS-CoV-2/FLU/RSV plus assay is intended as an aid in the diagnosis of influenza from Nasopharyngeal swab specimens and should not be used as a sole basis for treatment. Nasal washings and aspirates are unacceptable for Xpert Xpress SARS-CoV-2/FLU/RSV testing.  Fact Sheet for Patients: bloggercourse.com  Fact Sheet for Healthcare Providers: seriousbroker.it  This test is not yet approved or cleared by the [place]  FDA and has been authorized for detection and/or diagnosis of SARS-CoV-2 by FDA under an Emergency Use Authorization (EUA). This EUA will remain in effect (meaning this test can be used) for the duration of the COVID-19 declaration under Section 564(b)(1) of the Act, 21 U.S.C. section 360bbb-3(b)(1), unless the authorization is terminated or revoked.     Resp  Syncytial Virus by PCR NEGATIVE NEGATIVE Final    Comment: (NOTE) Fact Sheet for Patients: bloggercourse.com  Fact Sheet for Healthcare Providers: seriousbroker.it  This test is not yet approved or cleared by the [place]  FDA and has been authorized for detection and/or diagnosis of SARS-CoV-2 by FDA under an Emergency Use Authorization (EUA). This EUA will remain in effect (meaning this test can be used) for the duration of the COVID-19 declaration under Section 564(b)(1) of the Act, 21 U.S.C. section 360bbb-3(b)(1), unless the authorization is terminated or revoked.  Performed at  [facility], [address] [place], [place] [address]   MRSA Next Gen by PCR, Nasal     Status: None   Collection Time: 06/03/24  9:00 PM   Specimen: Nasal Mucosa; Nasal Swab  Result Value Ref Range Status   MRSA by PCR Next Gen NOT DETECTED NOT DETECTED Final    Comment: (NOTE) The GeneXpert MRSA Assay (FDA approved for NASAL specimens only), is one component of a comprehensive MRSA colonization surveillance program. It is not intended to diagnose MRSA infection nor to guide or monitor treatment for MRSA infections. Test performance is not FDA approved in patients less than 55 years old. Performed at [facility], [address] [place], [place] [address]   Culture, blood (Routine X 2) w Reflex to ID Panel     Status: None   Collection Time: 06/03/24  9:10 PM   Specimen: BLOOD LEFT HAND  Result Value Ref Range Status   Specimen Description BLOOD LEFT HAND  Final   Special Requests   Final    BOTTLES DRAWN AEROBIC AND ANAEROBIC Blood Culture adequate volume   Culture   Final    NO GROWTH 5 DAYS Performed at [facility], [address] [place], [place] [address]    Report Status 06/09/2024 FINAL  Final  Culture, blood (Routine X 2) w Reflex to ID Panel     Status: None   Collection Time: 06/04/24  4:43 AM   Specimen: BLOOD LEFT HAND   Result Value Ref Range Status   Specimen Description BLOOD LEFT HAND  Final   Special Requests   Final    BOTTLES DRAWN AEROBIC AND ANAEROBIC Blood Culture adequate volume   Culture   Final    NO GROWTH 5 DAYS Performed at [facility], [address], [place], [place] [address]    Report Status 06/09/2024 FINAL  Final    Radiology Studies: No results found.   Scheduled Meds:  aspirin  EC  81 mg Oral Daily   Chlorhexidine  Gluconate Cloth  6 each Topical Daily   ciprofloxacin   250 mg Oral Q24H   feeding supplement  237 mL Oral BID BM   furosemide   40 mg Intravenous Daily   hydrocortisone  sod succinate (SOLU-CORTEF ) inj  100 mg Intravenous Q12H   midodrine   10 mg Oral TID WC   potassium chloride   40 mEq Oral Once   tamsulosin   0.4 mg Oral BID   Continuous Infusions:     LOS: 7 days    Time: 50 minutes   Derryl Duval, MD [facility]   To contact the attending provider between 7A-7P or the covering provider during after hours 7P-7A, please log into the web site [email or web address] and access using universal  password for that web site. If you do not have the password, please call the hospital operator.  06/10/2024, 2:16 PM    "

## 2024-06-11 DIAGNOSIS — I5033 Acute on chronic diastolic (congestive) heart failure: Secondary | ICD-10-CM | POA: Diagnosis not present

## 2024-06-11 LAB — GLUCOSE, CAPILLARY
Glucose-Capillary: 133 mg/dL — ABNORMAL HIGH (ref 70–99)
Glucose-Capillary: 152 mg/dL — ABNORMAL HIGH (ref 70–99)
Glucose-Capillary: 180 mg/dL — ABNORMAL HIGH (ref 70–99)
Glucose-Capillary: 202 mg/dL — ABNORMAL HIGH (ref 70–99)
Glucose-Capillary: 238 mg/dL — ABNORMAL HIGH (ref 70–99)
Glucose-Capillary: 320 mg/dL — ABNORMAL HIGH (ref 70–99)

## 2024-06-11 MED ORDER — HYDROCORTISONE SOD SUC (PF) 100 MG IJ SOLR
50.0000 mg | Freq: Two times a day (BID) | INTRAMUSCULAR | Status: DC
  Administered 2024-06-11 – 2024-06-14 (×6): 50 mg via INTRAVENOUS
  Filled 2024-06-11 (×6): qty 2

## 2024-06-11 NOTE — Progress Notes (Signed)
 " PROGRESS NOTE    Guy Roberson  FMW:[id number] DOB: [birth year] DOA: 06/03/2024  PCP: [name], MD    Chief Complaint  Patient presents with   Shortness of Breath    Brief admission narrative:  Guy Roberson is a 89 y.o. male with medical history significant for CHF, severe valvular issues, severe TR, moderate MR, pulmonary hypertension, CKD, hypertension, diabetes mellitus, complete heart block pacemaker status. Patient presented to the ED with complaints of weight gain of 8 pounds over the past 1 week, with abdominal distention. Also AMS  Hospitalized 10/28 to 11/7 also for CHF, diuresed with IV Lasix .  proBNP 3949.   Admitted for acute on chronic diastolic heart failure, altered mental status.  He has been diuresing with IV Lasix .  Hospital course notable for hypotension requiring vasopressors.  Assessment & Plan:  Acute on chronic diastolic CHF (congestive heart failure) (HCC) Mild systolic heart failure, EF 50% per echocardiogram echo 2025 Severe MR and hypertension Moderate MR Severe TR -Has been having recurrent issues recently. -Patient was told in the past that he was not a surgical candidate for fixing valves. -Continue IV diuresis as blood pressure will allow, will keep lasix  to daily 40 mg IV today  - Follow daily weights/strict intake and output. BP has been borderline low - Continue to follow clinical response - Monitor renal function and electrolytes closely.   -Spoke with daughter-in-law 1/9, she would like to continue treatment and see how he does in the next few days.  She is encouraged to see improvement in the past few days but understands his chronic issues and risk of frequent hospitalization. - Palliative is on board.  Hyperkalemia Replacing as needed.  Was as low as 2.7.  Acute metabolic encephalopathy/hospital-acquired delirium -In the setting of UTI most likely -This is improving - Continue constant orientation - Avoid sedative agents  and continue constant reorientation  Urinary tract infection without hematuria Patient with recurrent UTIs - Suppression antibiotic given at baseline - Both urine and blood culture negative.  Ceftriaxone  discontinued, on suppression with Cipro .  Dose change per renal function - If mental status continue to improve, will discontinue Foley tomorrow  Type 2 diabetes mellitus with other circulatory complications (HCC) -Experiencing hypoglycemia and required d10 - Continue to follow CBG fluctuation. - Cortisol within normal limits.  Essential hypertension - Was on Levophed , currently off. Midodrine  added 1/8 due to borderline low BP.  Will continue 10 mg 3 times daily.  Received IV albumin  1/8  Hypoglycemia: Was on D10 drip for many days, discontinued 1/9, blood sugars improving with oral intake.  H/o Complete heart block (HCC) -Status post pacemaker implantation - Continue telemetry monitoring - Follow electrolytes stability and follow  CKD stage 3B baseline GFR results. -Appears to be stable - Continue to follow renal function trend - Continue to minimize nephrotoxic agents  Thrombocytopenia - Probably associated with acute infection - No overt bleeding appreciated on exam - Follow platelet counts trend and continue minimizing/avoiding the use of heparin  products.  Social/ethics - Patient DNR/DNI -Overall condition/prognosis guarded - Palliative care has been consulted and will follow further recommendations. Current goal is to continue current management with hopes of improvement.  Fortunately improving.  No escalation of care.  DVT prophylaxis: SCDs Code Status: DNR/DNI. Family Communication: daughter in law 1/9.  Wants to continue current management  Disposition:   Status is: Inpatient Remains inpatient, continuing diuresis, remains hypotensive requiring close management.  consultants:  None  Procedures:  See below  for x-ray reports.  Antimicrobials:   Ceftriaxone    Subjective: Patient was seen and examined earlier at today.  He was in a recliner.  Alert and conversant.  Says that he had issues with fluid buildup.  Blood pressures have been better in the past 2448 hrs. objective: Vitals:   06/11/24 0500 06/11/24 0600 06/11/24 0700 06/11/24 0800  BP:  136/71 (!) 128/59 (!) 123/53  Pulse: 71 72 70 91  Resp: 15 14 15 19   Temp: (!) 96.8 F (36 C) (!) 97.2 F (36.2 C) (!) 97 F (36.1 C) (!) 97 F (36.1 C)  TempSrc:      SpO2: 95% 96% 95% 97%  Weight:      Height:        Intake/Output Summary (Last 24 hours) at 06/11/2024 0958 Last data filed at 06/10/2024 2139 Gross per 24 hour  Intake --  Output 850 ml  Net -850 ml   Filed Weights   06/09/24 0537 06/10/24 0500 06/11/24 0445  Weight: 82.5 kg 81.5 kg 81.7 kg    Examination: General: Alert, conversant  chest: Mild crackles bilaterally, no wheezing CVS: S1, S2, regular, paced in telemetry Abdomen: Soft, nontender Extremities: no edema Data Reviewed: I have personally reviewed following labs and imaging studies  CBC: Recent Labs  Lab 06/09/24 0539  WBC 6.6  NEUTROABS 5.2  HGB 9.1*  HCT 29.4*  MCV 93.3  PLT 79*    Basic Metabolic Panel: Recent Labs  Lab 06/06/24 0439 06/07/24 0440 06/07/24 0846 06/08/24 0442 06/09/24 0539 06/10/24 0326  NA 142 144  --  142 141 138  K 3.5 2.7*  --  3.5 3.5 3.5  CL 102 102  --  102 101 98  CO2 28 34*  --  33* 25 31  GLUCOSE 96 143*  --  127* 163* 159*  BUN 45* 44*  --  46* 49* 59*  CREATININE 2.06* 1.81*  --  1.76* 1.62* 1.75*  CALCIUM  9.5 8.9  --  8.8* 8.8* 8.9  MG  --   --  2.1  --  2.3  --     GFR: Estimated Creatinine Clearance: 28.7 mL/min (A) (by C-G formula based on SCr of 1.75 mg/dL (H)).  Liver Function Tests: No results for input(s): AST, ALT, ALKPHOS, BILITOT, PROT, ALBUMIN  in the last 168 hours.   CBG: Recent Labs  Lab 06/10/24 1609 06/10/24 1934 06/10/24 2321 06/11/24 0329  06/11/24 0727  GLUCAP 119* 283* 230* 152* 133*    Recent Results (from the past 240 hours)  Urine Culture     Status: None   Collection Time: 06/03/24  2:16 PM   Specimen: Urine, Random  Result Value Ref Range Status   Specimen Description   Final    URINE, RANDOM Performed at [facility], [address] [place], [place] [address]    Special Requests   Final    NONE Reflexed from [id number] Performed at [facility], [address], [place], [place] [address]    Culture   Final    NO GROWTH Performed at [facility], [address] [place], [place] [address]    Report Status 06/05/2024 FINAL  Final  Resp panel by RT-PCR (RSV, Flu A&B, Covid) Anterior Nasal Swab     Status: None   Collection Time: 06/03/24  2:28 PM   Specimen: Anterior Nasal Swab  Result Value Ref Range Status   SARS Coronavirus 2 by RT PCR NEGATIVE NEGATIVE Final    Comment: (NOTE)  SARS-CoV-2 target nucleic acids are NOT DETECTED.  The SARS-CoV-2 RNA is generally detectable in upper respiratory specimens during the acute phase of infection. The lowest concentration of SARS-CoV-2 viral copies this assay can detect is 138 copies/mL. A negative result does not preclude SARS-Cov-2 infection and should not be used as the sole basis for treatment or other patient management decisions. A negative result may occur with  improper specimen collection/handling, submission of specimen other than nasopharyngeal swab, presence of viral mutation(s) within the areas targeted by this assay, and inadequate number of viral copies(<138 copies/mL). A negative result must be combined with clinical observations, patient history, and epidemiological information. The expected result is Negative.  Fact Sheet for Patients:  bloggercourse.com  Fact Sheet for Healthcare Providers:  seriousbroker.it  This test is no t yet approved or cleared by the [place]  FDA and  has  been authorized for detection and/or diagnosis of SARS-CoV-2 by FDA under an Emergency Use Authorization (EUA). This EUA will remain  in effect (meaning this test can be used) for the duration of the COVID-19 declaration under Section 564(b)(1) of the Act, 21 U.S.C.section 360bbb-3(b)(1), unless the authorization is terminated  or revoked sooner.       Influenza A by PCR NEGATIVE NEGATIVE Final   Influenza B by PCR NEGATIVE NEGATIVE Final    Comment: (NOTE) The Xpert Xpress SARS-CoV-2/FLU/RSV plus assay is intended as an aid in the diagnosis of influenza from Nasopharyngeal swab specimens and should not be used as a sole basis for treatment. Nasal washings and aspirates are unacceptable for Xpert Xpress SARS-CoV-2/FLU/RSV testing.  Fact Sheet for Patients: bloggercourse.com  Fact Sheet for Healthcare Providers: seriousbroker.it  This test is not yet approved or cleared by the [place]  FDA and has been authorized for detection and/or diagnosis of SARS-CoV-2 by FDA under an Emergency Use Authorization (EUA). This EUA will remain in effect (meaning this test can be used) for the duration of the COVID-19 declaration under Section 564(b)(1) of the Act, 21 U.S.C. section 360bbb-3(b)(1), unless the authorization is terminated or revoked.     Resp Syncytial Virus by PCR NEGATIVE NEGATIVE Final    Comment: (NOTE) Fact Sheet for Patients: bloggercourse.com  Fact Sheet for Healthcare Providers: seriousbroker.it  This test is not yet approved or cleared by the [place]  FDA and has been authorized for detection and/or diagnosis of SARS-CoV-2 by FDA under an Emergency Use Authorization (EUA). This EUA will remain in effect (meaning this test can be used) for the duration of the COVID-19 declaration under Section 564(b)(1) of the Act, 21 U.S.C. section 360bbb-3(b)(1), unless the  authorization is terminated or revoked.  Performed at [facility], [address], [place], [place] [address]   MRSA Next Gen by PCR, Nasal     Status: None   Collection Time: 06/03/24  9:00 PM   Specimen: Nasal Mucosa; Nasal Swab  Result Value Ref Range Status   MRSA by PCR Next Gen NOT DETECTED NOT DETECTED Final    Comment: (NOTE) The GeneXpert MRSA Assay (FDA approved for NASAL specimens only), is one component of a comprehensive MRSA colonization surveillance program. It is not intended to diagnose MRSA infection nor to guide or monitor treatment for MRSA infections. Test performance is not FDA approved in patients less than 61 years old. Performed at [facility], [address] [place], [place] [address]   Culture, blood (Routine X 2) w Reflex to ID Panel     Status: None   Collection Time: 06/03/24  9:10 PM   Specimen: BLOOD LEFT HAND  Result Value Ref Range Status   Specimen Description BLOOD LEFT HAND  Final   Special Requests   Final    BOTTLES DRAWN AEROBIC AND ANAEROBIC Blood Culture adequate volume   Culture   Final    NO GROWTH 5 DAYS Performed at [facility], [address], [place], [place] [address]    Report Status 06/09/2024 FINAL  Final  Culture, blood (Routine X 2) w Reflex to ID Panel     Status: None   Collection Time: 06/04/24  4:43 AM   Specimen: BLOOD LEFT HAND  Result Value Ref Range Status   Specimen Description BLOOD LEFT HAND  Final   Special Requests   Final    BOTTLES DRAWN AEROBIC AND ANAEROBIC Blood Culture adequate volume   Culture   Final    NO GROWTH 5 DAYS Performed at [facility], [address] [place], [place] [address]    Report Status 06/09/2024 FINAL  Final    Radiology Studies: No results found.   Scheduled Meds:  aspirin  EC  81 mg Oral Daily   Chlorhexidine  Gluconate Cloth  6 each Topical Daily   ciprofloxacin   250 mg Oral Q24H   feeding supplement  237 mL Oral BID BM   furosemide   40 mg Intravenous Daily    hydrocortisone  sod succinate (SOLU-CORTEF ) inj  50 mg Intravenous Q12H   midodrine   10 mg Oral TID WC   potassium chloride   40 mEq Oral Once   tamsulosin   0.4 mg Oral BID   Continuous Infusions:     LOS: 8 days    Time: 50 minutes   Derryl Duval, MD [facility]   To contact the attending provider between 7A-7P or the covering provider during after hours 7P-7A, please log into the web site [email or web address] and access using universal [place] password for that web site. If you do not have the password, please call the hospital operator.  06/11/2024, 9:58 AM    "

## 2024-06-11 NOTE — Progress Notes (Unsigned)
 "  Cardiology Office Note    Date:  06/11/2024  ID:  Guy Roberson, Guy Roberson [birth year], MRN [id number] Cardiologist: Previously only followed by Dr. Waddell  Electrophysiologist:  Guy DELENA Primus, MD { :  History of Present Illness:    Guy Roberson is a 89 y.o. male with past medical history of CHB (s/p Medtronic PPM placement in 11/2011), CAD (s/p CABG in 2011 with LIMA-LAD, SVG-D1, SVG-OM and SVG-PDA, cath in 2012 showing 4/4 patent grafts), seizures, HTN, HLD, BPH and Type 2 DM who presents to the office today for hospital follow-up.  He was last examined by myself in 03/2024 following a recent admission for acute on chronic renal failure in the setting of a UTI.  He had been at the [facility] but had experienced significant weight gain and worsening lower extremity edema.  Symptoms had improved since returning home and he was continued on Lasix  60 mg daily.  It was noted that his device was at [facility] and he was evaluated Dr. Primus at the time of his visit as well as it was felt that this could be contributing to his CHF.  He underwent successful GEN change out on 03/09/2024 with no immediate complications noted.  In the interim, he was admitted to [facility] from 10/28 - 04/07/2024 for an acute CHF exacerbation and repeat echocardiogram showed his EF was estimated 50% with no regional wall motion abnormalities.  RV function was not well-visualized and he was noted to have severely elevated PASP along with moderate MR, moderate to severe TR and mild AI. Also had acute encephalopathy during his admission and an AKI. Cardiac medications at the time of discharge included ASA 81 mg daily, Lasix  40 mg daily, Lopressor  25 mg twice daily and Simvastatin  20 mg daily. He was discharged to the [facility] he did miss his follow-up visit with the EP on 04/11/2024. Was discharged home from the [facility] on 04/20/2024.  He was hospitalized at [facility] again from 1/3 - ***.   ROS: ***  Studies  Reviewed:   EKG: EKG is*** ordered today and demonstrates ***   EKG Interpretation Date/Time:    Ventricular Rate:    PR Interval:    QRS Duration:    QT Interval:    QTC Calculation:   R Axis:      Text Interpretation:         Echocardiogram: 03/29/2024 IMPRESSIONS     1. Left ventricular ejection fraction, by estimation, is 50%. The left  ventricle has low normal function. The left ventricle has no regional wall  motion abnormalities. Left ventricular diastolic parameters are  indeterminate.   2. Right ventricular systolic function was not well visualized. The right  ventricular size is not well visualized. There is severely elevated  pulmonary artery systolic pressure.   3. The mitral valve is abnormal. Moderate mitral valve regurgitation.   4. Tricuspid valve regurgitation is moderate to severe.   5. The aortic valve is tricuspid. There is mild calcification of the  aortic valve. There is mild thickening of the aortic valve. Aortic valve  regurgitation is mild. No aortic stenosis is present.   6. The inferior vena cava is normal in size with greater than 50%  respiratory variability, suggesting right atrial pressure of 3 mmHg.    Risk Assessment/Calculations:   {Does this patient have ATRIAL FIBRILLATION?:[id number]}           Physical Exam:   VS:  There were no vitals taken for  this visit.   Wt Readings from Last 3 Encounters:  06/11/24 180 lb 1.9 oz (81.7 kg)  04/20/24 165 lb 12.8 oz (75.2 kg)  04/11/24 165 lb 12.8 oz (75.2 kg)     GEN: Well nourished, well developed in no acute distress NECK: No JVD; No carotid bruits CARDIAC: ***RRR, no murmurs, rubs, gallops RESPIRATORY:  Clear to auscultation without rales, wheezing or rhonchi  ABDOMEN: Appears non-distended. No obvious abdominal masses. EXTREMITIES: No clubbing or cyanosis. No edema.  Distal pedal pulses are 2+ bilaterally.   Assessment and Plan:   1. Chronic heart failure with preserved  ejection fraction (HCC) ***  2. Coronary artery disease involving native coronary artery of native heart without angina pectoris ***  3. Complete heart block (HCC) ***  4. Valvular heart disease ***  5. Essential hypertension ***  6. Hyperlipidemia LDL goal <55 ***   Signed, Guy CHRISTELLA Qua, PA-C   "

## 2024-06-12 DIAGNOSIS — I5033 Acute on chronic diastolic (congestive) heart failure: Secondary | ICD-10-CM | POA: Diagnosis not present

## 2024-06-12 LAB — BASIC METABOLIC PANEL WITH GFR
Anion gap: 6 (ref 5–15)
BUN: 58 mg/dL — ABNORMAL HIGH (ref 8–23)
CO2: 34 mmol/L — ABNORMAL HIGH (ref 22–32)
Calcium: 9.3 mg/dL (ref 8.9–10.3)
Chloride: 101 mmol/L (ref 98–111)
Creatinine, Ser: 1.33 mg/dL — ABNORMAL HIGH (ref 0.61–1.24)
GFR, Estimated: 51 mL/min — ABNORMAL LOW
Glucose, Bld: 157 mg/dL — ABNORMAL HIGH (ref 70–99)
Potassium: 3.4 mmol/L — ABNORMAL LOW (ref 3.5–5.1)
Sodium: 141 mmol/L (ref 135–145)

## 2024-06-12 LAB — GLUCOSE, CAPILLARY
Glucose-Capillary: 161 mg/dL — ABNORMAL HIGH (ref 70–99)
Glucose-Capillary: 170 mg/dL — ABNORMAL HIGH (ref 70–99)
Glucose-Capillary: 148 mg/dL — ABNORMAL HIGH (ref 70–99)
Glucose-Capillary: 192 mg/dL — ABNORMAL HIGH (ref 70–99)
Glucose-Capillary: 364 mg/dL — ABNORMAL HIGH (ref 70–99)

## 2024-06-12 MED ORDER — FUROSEMIDE 40 MG PO TABS
40.0000 mg | ORAL_TABLET | Freq: Two times a day (BID) | ORAL | Status: DC
  Administered 2024-06-12 – 2024-06-15 (×8): 40 mg via ORAL
  Filled 2024-06-12 (×8): qty 1

## 2024-06-12 MED ORDER — POTASSIUM CHLORIDE 20 MEQ PO PACK
20.0000 meq | PACK | Freq: Two times a day (BID) | ORAL | Status: AC
  Administered 2024-06-12 (×2): 20 meq via ORAL
  Filled 2024-06-12 (×2): qty 1

## 2024-06-12 NOTE — Plan of Care (Signed)
" °  Problem: Acute Rehab OT Goals (only OT should resolve) Goal: Pt. Will Perform Grooming Flowsheets (Taken 06/12/2024 1626) Pt Will Perform Grooming:  with contact guard assist  standing  with min assist Goal: Pt. Will Perform Upper Body Dressing Flowsheets (Taken 06/12/2024 1626) Pt Will Perform Upper Body Dressing: with modified independence Goal: Pt. Will Perform Lower Body Dressing Flowsheets (Taken 06/12/2024 1626) Pt Will Perform Lower Body Dressing:  with min assist  with adaptive equipment  sitting/lateral leans Goal: Pt. Will Transfer To Toilet Flowsheets (Taken 06/12/2024 1626) Pt Will Transfer to Toilet:  with contact guard assist  with supervision  stand pivot transfer Goal: Pt. Will Perform Toileting-Clothing Manipulation Flowsheets (Taken 06/12/2024 1626) Pt Will Perform Toileting - Clothing Manipulation and hygiene: with min assist Goal: Pt/Caregiver Will Perform Home Exercise Program Flowsheets (Taken 06/12/2024 1626) Pt/caregiver will Perform Home Exercise Program:  Increased strength  Both right and left upper extremity  With minimal assist  Jonathan Kirkendoll OT, MOT  "

## 2024-06-12 NOTE — NC FL2 (Signed)
 " [place]  MEDICAID FL2 LEVEL OF CARE FORM     IDENTIFICATION  Patient Name: Guy Roberson Birthdate: 22-Oct-1931 Sex: male Admission Date (Current Location): 06/03/2024  [facility] and [place] Number:  [employer] and Address:  [facility],  [address] [place] [address]      Provider Number: [id number]  Attending Physician Name and Address:  Mcarthur Pick, MD  Relative Name and Phone Number:       Current Level of Care: Hospital Recommended Level of Care: Skilled Nursing Facility Prior Approval Number:    Date Approved/Denied:   PASRR Number: [id number]  Discharge Plan: SNF    Current Diagnoses: Patient Active Problem List   Diagnosis Date Noted   Chronic respiratory failure with hypoxia (HCC) 04/11/2024   Chronic UTI 04/11/2024   CKD stage 3 due to type 2 diabetes mellitus (HCC) 04/11/2024   Anemia in chronic kidney disease (CKD) 04/11/2024   Acute on chronic heart failure with preserved ejection fraction (HFpEF) (HCC) 03/28/2024   Thrombocytopenia 03/28/2024   Benign hypertension with CKD (chronic kidney disease) stage III (HCC) 02/10/2024   Chronic diastolic (congestive) heart failure (HCC) 02/10/2024   Thyroid nodule greater than or equal to 1.5 cm in diameter incidentally noted on imaging study 02/03/2024   Acute metabolic encephalopathy 01/28/2024   Urinary tract infection without hematuria 01/28/2024   CKD stage 3a, GFR 45-59 ml/min (HCC) 01/27/2024   Aorto-iliac atherosclerosis 08/18/2022   Benign hypertension with coincident congestive heart failure (HCC) 08/18/2022   Hyperlipidemia associated with type 2 diabetes mellitus (HCC) 08/18/2022   OAB (overactive bladder) 08/18/2022   Mild protein-calorie malnutrition 08/18/2022   Chronic constipation 08/18/2022   Neurocognitive deficits 08/18/2022   Acute on chronic diastolic CHF (congestive heart failure) (HCC) 08/12/2022   Fall at home, initial encounter 08/12/2022   AKI  (acute kidney injury) 08/12/2022   GERD (gastroesophageal reflux disease) 08/12/2022   Generalized weakness 03/04/2022   COVID-19 virus infection 03/04/2022   Acute congestive heart failure (HCC) 03/04/2022   Hypokalemia 03/04/2022   Gross hematuria 07/07/2021   Benign prostatic hyperplasia with urinary retention 07/07/2021   Bladder diverticulum 07/07/2021   Neurogenic bladder 07/07/2021   Urinary retention 01/01/2012   Pacemaker-Medtronic 12/30/2011   Acute respiratory failure with hypoxia (HCC) 12/29/2011   Rib fracture 12/29/2011   Hypoxemia 12/29/2011   Syncope    Complete heart block (HCC)    Mixed hyperlipidemia    CAD (coronary artery disease)    Arteriosclerotic cardiovascular disease (ASCVD)    Fasting hyperglycemia    Colonic polyp    Type 2 diabetes mellitus with other circulatory complications (HCC) 10/02/2009   Essential hypertension 09/30/2009    Orientation RESPIRATION BLADDER Height & Weight     Self  Normal Incontinent, External catheter Weight: 80.1 kg Height:  5' 7 (170.2 cm)  BEHAVIORAL SYMPTOMS/MOOD NEUROLOGICAL BOWEL NUTRITION STATUS      Incontinent Diet  AMBULATORY STATUS COMMUNICATION OF NEEDS Skin   Extensive Assist Verbally Skin abrasions, Bruising (Abraisions, Ecchymosis and Erythema of Bilat Arms & Sacrum, Scattered Ecchymosis of Buttocks, Arms, & Legs)                       Personal Care Assistance Level of Assistance  Bathing, Dressing, Feeding Bathing Assistance: Maximum assistance Feeding assistance: Limited assistance Dressing Assistance: Maximum assistance     Functional Limitations Info  Hearing   Hearing Info: Impaired      SPECIAL CARE FACTORS FREQUENCY  PT (By licensed PT), OT (By licensed OT)     PT Frequency: 5x weekly OT Frequency: 5x weekly            Contractures Contractures Info: Not present    Additional Factors Info                  Current Medications (06/12/2024):  This is the current  hospital active medication list Current Facility-Administered Medications  Medication Dose Route Frequency Provider Last Rate Last Admin   acetaminophen  (TYLENOL ) tablet 650 mg  650 mg Oral Q6H PRN Emokpae, Ejiroghene E, MD   650 mg at 06/09/24 1211   Or   acetaminophen  (TYLENOL ) suppository 650 mg  650 mg Rectal Q6H PRN Emokpae, Ejiroghene E, MD       aspirin  EC tablet 81 mg  81 mg Oral Daily Emokpae, Ejiroghene E, MD   81 mg at 06/12/24 0917   Chlorhexidine  Gluconate Cloth 2 % PADS 6 each  6 each Topical Daily Emokpae, Ejiroghene E, MD   6 each at 06/12/24 0918   ciprofloxacin  (CIPRO ) tablet 250 mg  250 mg Oral Q24H Sigdel, Derryl, MD   250 mg at 06/12/24 0917   feeding supplement (ENSURE PLUS HIGH PROTEIN) liquid 237 mL  237 mL Oral BID BM Ricky Fines, MD   237 mL at 06/12/24 1208   furosemide  (LASIX ) tablet 40 mg  40 mg Oral BID Sigdel, Santosh, MD   40 mg at 06/12/24 9082   hydrocortisone  sodium succinate  (SOLU-CORTEF ) 100 MG injection 50 mg  50 mg Intravenous Q12H Sigdel, Derryl, MD   50 mg at 06/12/24 0430   ipratropium-albuterol  (DUONEB) 0.5-2.5 (3) MG/3ML nebulizer solution 3 mL  3 mL Nebulization Q2H PRN Hall, Carole N, DO       midodrine  (PROAMATINE ) tablet 10 mg  10 mg Oral TID WC Sigdel, Santosh, MD   10 mg at 06/12/24 1207   ondansetron  (ZOFRAN ) tablet 4 mg  4 mg Oral Q6H PRN Emokpae, Ejiroghene E, MD       Or   ondansetron  (ZOFRAN ) injection 4 mg  4 mg Intravenous Q6H PRN Emokpae, Ejiroghene E, MD       Oral care mouth rinse  15 mL Mouth Rinse PRN Emokpae, Ejiroghene E, MD       polyethylene glycol (MIRALAX  / GLYCOLAX ) packet 17 g  17 g Oral Daily PRN Emokpae, Ejiroghene E, MD       potassium chloride  (KLOR-CON ) packet 20 mEq  20 mEq Oral BID Sigdel, Santosh, MD   20 mEq at 06/12/24 9082   potassium chloride  SA (KLOR-CON  M) CR tablet 40 mEq  40 mEq Oral Once Sigdel, Santosh, MD       tamsulosin  (FLOMAX ) capsule 0.4 mg  0.4 mg Oral BID Emokpae, Ejiroghene E, MD   0.4 mg at  06/12/24 9082     Discharge Medications: Please see discharge summary for a list of discharge medications.  Relevant Imaging Results:  Relevant Lab Results:   Additional Information    [name], RN     "

## 2024-06-12 NOTE — Evaluation (Signed)
 Occupational Therapy Evaluation Patient Details Name: Guy Roberson MRN: [id number] DOB: 1931-11-04 Today's Date: 06/12/2024   History of Present Illness   Guy Roberson is a 89 y.o. male with medical history significant for CHF, CKD, hypertension, diabetes mellitus, complete heart block pacemaker status.  Patient presented to the ED with complaints of weight gain of 8 pounds over the past 1 week, with abdominal distention.  Bilateral lower extremity edema is chronic and at baseline.  Son also reports that patient's speech was confused earlier today-saying things that did not make sense.  He has been compliant with Lasix  40-60 mg daily. (per MD)     Clinical Impressions Pt agreeable to OT evaluation. Pt confused and was difficulty to follow at times. No family present to confirm prior living history. Pt required CGA to min A for bed mobility and mod A for chair to EOB and back with RW. Pt's L UE is weak at the shoulder but Guy Roberson for functional use. Pt unable to complete lower body dressing today, and is likely assisted for this at baseline. Pt left in the chair with call bell within reach and chair alarm set. Pt will benefit from continued OT in the Roberson to increase strength, balance, and endurance for safe ADL's.        If plan is discharge home, recommend the following:   A lot of help with walking and/or transfers;A lot of help with bathing/dressing/bathroom;Assistance with cooking/housework;Assist for transportation;Help with stairs or ramp for entrance;Direct supervision/assist for medications management;Supervision due to cognitive status     Functional Status Assessment   Patient has had a recent decline in their functional status and demonstrates the ability to make significant improvements in function in a reasonable and predictable amount of time.     Equipment Recommendations   None recommended by OT            Precautions/Restrictions    Precautions Precautions: Fall Recall of Precautions/Restrictions: Impaired Restrictions Weight Bearing Restrictions Per Provider Order: No     Mobility Bed Mobility Overal bed mobility: Needs Assistance Bed Mobility: Supine to Sit, Sit to Supine     Supine to sit: Min assist Sit to supine: Contact guard assist   General bed mobility comments: Single hand held assist to pull to sit at EOB.    Transfers Overall transfer level: Needs assistance Equipment used: Rolling walker (2 wheels) Transfers: Sit to/from Stand, Bed to chair/wheelchair/BSC Sit to Stand: Mod assist     Step pivot transfers: Mod assist     General transfer comment: Chair to EOB and back with RW.      Balance Overall balance assessment: Needs assistance Sitting-balance support: No upper extremity supported, Feet supported Sitting balance-Leahy Scale: Fair Sitting balance - Comments: seated at EOB   Standing balance support: Bilateral upper extremity supported, During functional activity, Reliant on assistive device for balance Standing balance-Leahy Scale: Poor                             ADL either performed or assessed with clinical judgement   ADL Overall ADL's : Needs assistance/impaired Eating/Feeding: Minimal assistance;Set up;Sitting   Grooming: Set up;Minimal assistance;Sitting   Upper Body Bathing: Set up;Minimal assistance;Sitting   Lower Body Bathing: Maximal assistance;Sitting/lateral leans;Total assistance   Upper Body Dressing : Set up;Minimal assistance;Sitting   Lower Body Dressing: Maximal assistance;Sitting/lateral leans;Total assistance   Toilet Transfer: Moderate assistance;Rolling walker (2 wheels);Stand-pivot Statistician Details (indicate cue  type and reason): Chair to EOB and back with RW Toileting- Clothing Manipulation and Hygiene: Maximal assistance;Total assistance;Bed level               Vision Baseline Vision/History: 1 Wears  glasses Ability to See in Adequate Light: 0 Adequate Patient Visual Report: No change from baseline Vision Assessment?: No apparent visual deficits     Perception Perception: Not tested       Praxis Praxis: Not tested       Pertinent Vitals/Pain Pain Assessment Pain Assessment: Faces Faces Pain Scale: No hurt     Extremity/Trunk Assessment Upper Extremity Assessment Upper Extremity Assessment: RUE deficits/detail;LUE deficits/detail RUE Deficits / Details: 4+/5 grossly LUE Deficits / Details: 4-/5 shoulder flexion; generally weak otherwise but simlar level to R LE.   Lower Extremity Assessment Lower Extremity Assessment: Defer to PT evaluation   Cervical / Trunk Assessment Cervical / Trunk Assessment: Kyphotic   Communication Communication Communication: Impaired Factors Affecting Communication: Hearing impaired;Other (comment);Reduced clarity of speech   Cognition Arousal: Alert Behavior During Therapy: WFL for tasks assessed/performed Cognition: History of cognitive impairments, Cognition impaired             OT - Cognition Comments: Pt confused and verbalizing often, but typically off topic. Difficulty to follow pt's train of thought.                 Following commands: Impaired Following commands impaired: Follows one step commands with increased time     Cueing  General Comments   Cueing Techniques: Verbal cues;Tactile cues                 Home Living Family/patient expects to be discharged to:: Private residence Living Arrangements: Spouse/significant other Available Help at Discharge: Family;Available PRN/intermittently Type of Home: House Home Access: Ramped entrance     Home Layout: One level     Bathroom Shower/Tub: Producer, Television/film/video: Standard Bathroom Accessibility: Yes   Home Equipment: Rollator (4 wheels);Rolling Walker (2 wheels);Cane - single point;Wheelchair - manual   Additional Comments: Information  per chart; no family present today.      Prior Functioning/Environment Prior Level of Function : Needs assist;Patient poor historian/Family not available       Physical Assist : Mobility (physical);ADLs (physical) Mobility (physical): Bed mobility;Transfers;Gait;Stairs ADLs (physical): Feeding;Grooming;Bathing;Dressing;Toileting;IADLs Mobility Comments: No family present to provide perior level of function. ADLs Comments: No family present to provide prior level of function.    OT Problem List: Decreased strength;Decreased activity tolerance;Impaired balance (sitting and/or standing);Decreased cognition   OT Treatment/Interventions: Self-care/ADL training;Therapeutic exercise;Therapeutic activities;Cognitive remediation/compensation;Patient/family education;Balance training;DME and/or AE instruction      OT Goals(Current goals can be found in the care plan section)   Acute Rehab OT Goals Patient Stated Goal: Improve function. OT Goal Formulation: With patient Time For Goal Achievement: 06/26/24 Potential to Achieve Goals: Good   OT Frequency:  Min 2X/week                                   End of Session Equipment Utilized During Treatment: Rolling walker (2 wheels);Gait belt  Activity Tolerance: Patient tolerated treatment well Patient left: in chair;with call bell/phone within reach;with chair alarm set  OT Visit Diagnosis: Unsteadiness on feet (R26.81);Other abnormalities of gait and mobility (R26.89);Muscle weakness (generalized) (M62.81);Other symptoms and signs involving cognitive function  Time: 8693-8676 OT Time Calculation (min): 17 min Charges:  OT General Charges $OT Visit: 1 Visit OT Evaluation $OT Eval Low Complexity: 1 Low  Ernan Runkles OT, MOT  Jayson Person 06/12/2024, 4:24 PM

## 2024-06-12 NOTE — TOC Progression Note (Signed)
 Transition of Care [facility]) - Progression Note    Patient Details  Name: Guy Roberson MRN: [id number] Date of Birth: [birth year]  Transition of Care [facility]) CM/SW Contact  [name], RN Phone Number: 06/12/2024, 3:07 PM  Clinical Narrative:    Patient discussed during progression rounds this morning, Dr Mcarthur confirmed paitent is approaching medical ready to discharge with plan for Home with HHPT.  PT performed eval this afternoon and now recommending SNF for STR.  CM contacted DIL - [name] to updated her on PT Rec and she was intially in agreement, indicated preference for SNF in either [place] or [place], and specifically #1 choice as [facility], stating patient discharged to [facility] on 04/07/24 and did very good there.  [name] confirmed patient was at [facility] from 11/7 - 04/20/24, CM informed her of SNF benefit and the likelihood patient only had 1 - week left of 100% SNF benefit coverage before transitioning to C0-Pay days.   During my phone conversation with [name], the Patient 's son - [name] interjected, stating he did not feel SNF rehab would be beneficial for the patient.  [name] asserted the patient would not fully participate and didn't do anything once he got home.  [name] prefers the patient return Home with HHPT as previously arranged.  [name] requested CM still send out referrals in the event Tarren reconsiders and agrees to patient transitioning to SNF.    CM sent out SNF referrals via Epic Hub per [name]'s preference and completed FL2.  CM team will continue to follow along and assist as appropriate.    Expected Discharge Plan: Home w Home Health Services Barriers to Discharge: Continued Medical Work up  Expected Discharge Plan and Services In-house Referral: Clinical Social Work Discharge Planning Services: NA Post Acute Care Choice: Resumption of Svcs/PTA Provider, Home Health Living arrangements for the past 2 months: Single Family Home         DME Arranged: N/A DME Agency: NA HH  Agency: [facility]  Social Drivers of Health (SDOH) Interventions SDOH Screenings   Food Insecurity: Patient Unable To Answer (06/04/2024)  Housing: Low Risk (06/10/2024)  Transportation Needs: Patient Unable To Answer (06/04/2024)  Utilities: Patient Unable To Answer (06/04/2024)  Depression (PHQ2-9): Low Risk (08/17/2022)  Social Connections: Patient Unable To Answer (06/04/2024)  Tobacco Use: High Risk (06/03/2024)    Readmission Risk Interventions    06/05/2024   12:38 PM 04/07/2024    3:11 PM 04/02/2024   10:07 AM  Readmission Risk Prevention Plan  Transportation Screening Complete Complete Complete  PCP or Specialist Appt within 3-5 Days Complete    HRI or Home Care Consult Complete Complete Complete  Social Work Consult for Recovery Care Planning/Counseling Complete Complete Complete  Palliative Care Screening Not Applicable Not Applicable Not Applicable  Medication Review Oceanographer) Complete Complete Complete

## 2024-06-12 NOTE — Progress Notes (Addendum)
 " PROGRESS NOTE    Guy Roberson  FMW:[id number] DOB: Jun 03, 1931 DOA: 06/03/2024  PCP: [name], MD    Chief Complaint  Patient presents with   Shortness of Breath    Brief admission narrative:  Guy Roberson is a 89 y.o. male with medical history significant for CHF, severe valvular issues, severe TR, moderate MR, pulmonary hypertension, CKD, hypertension, diabetes mellitus, complete heart block pacemaker status. Patient presented to the ED with complaints of weight gain of 8 pounds over the past 1 week, with abdominal distention. Also AMS  Hospitalized 10/28 to 11/7 also for CHF, diuresed with IV Lasix .  proBNP 3949.   Admitted for acute on chronic diastolic heart failure, altered mental status.  He has been diuresing with IV Lasix .  Hospital course notable for hypotension requiring vasopressors.  Assessment & Plan:  Acute on chronic diastolic CHF (congestive heart failure) (HCC) Mild systolic heart failure, EF 50% per echocardiogram echo 2025 Severe MR and hypertension Moderate MR Severe TR -Has been having recurrent issues recently. -Patient was told in the past that he was not a surgical candidate for fixing valves. -Continue IV diuresis as blood pressure will allow, switch Lasix  to 40 mg twice daily today - Follow daily weights/strict intake and output. -BP has been borderline but is staying steady over the past several days.- Continue to follow clinical response - Monitor renal function and electrolytes closely.   -- Palliative is on board.  Plan to continue current management with no escalation of care if declining. - Transfer out of ICU  Hyperkalemia Replacing as needed.  Was as low as 2.7.  Acute metabolic encephalopathy/hospital-acquired delirium -In the setting of UTI most likely -This is improving - Continue constant orientation - Avoid sedative agents and continue constant reorientation  Urinary tract infection without hematuria Patient with  recurrent UTIs - Suppression antibiotic given at baseline - Both urine and blood culture negative.  Ceftriaxone  discontinued, on suppression with Cipro .  Dose change per renal function - Will discontinue Foley today.  Type 2 diabetes mellitus with other circulatory complications (HCC) -Experiencing hypoglycemia and required d10 - Continue to follow CBG fluctuation. - Cortisol within normal limits.  Essential hypertension - Was on Levophed , currently off. Midodrine  added 1/8 due to borderline low BP.  Will continue 10 mg 3 times daily.  Received IV albumin  1/8  Hypoglycemia: Was on D10 drip for many days, discontinued 1/9, blood sugars improving with oral intake.  H/o Complete heart block (HCC) -Status post pacemaker implantation - Continue telemetry monitoring - Follow electrolytes stability and follow  CKD stage 3B baseline GFR results. -Appears to be stable - Continue to follow renal function trend - Continue to minimize nephrotoxic agents  Thrombocytopenia - Probably associated with acute infection - No overt bleeding appreciated on exam - Follow platelet counts trend and continue minimizing/avoiding the use of heparin  products.  Social/ethics - Patient DNR/DNI -Overall condition/prognosis guarded - Palliative care has been consulted and will follow further recommendations. Current goal is to continue current management with hopes of improvement.  Fortunately improving.  No escalation of care.  DVT prophylaxis: SCDs Code Status: DNR/DNI. Family Communication: Will update family. Disposition:   Status is: Inpatient Remains inpatient, continuing diuresis, remains hypotensive requiring close management.  consultants:  None  Procedures:  See below for x-ray reports.  Antimicrobials:  Ceftriaxone    Subjective: Patient was seen and examined earlier today.  He was on recliner, sleepy this morning, arousable.  Confused medicines still.  Temperature slightly  low at  97.5 degrees.  objective: Vitals:   06/12/24 0716 06/12/24 0800 06/12/24 0900 06/12/24 1000  BP:  (!) 115/43 (!) 114/49 (!) 126/41  Pulse: (!) 59 (!) 59 (!) 59 (!) 58  Resp: 12 12 19 15   Temp: (!) 97 F (36.1 C) (!) 97.5 F (36.4 C) (!) 97.5 F (36.4 C)   TempSrc: Bladder     SpO2: 100% 99% (!) 89% 100%  Weight:      Height:        Intake/Output Summary (Last 24 hours) at 06/12/2024 1053 Last data filed at 06/12/2024 0901 Gross per 24 hour  Intake --  Output 1900 ml  Net -1900 ml   Filed Weights   06/10/24 0500 06/11/24 0445 06/12/24 0421  Weight: 81.5 kg 81.7 kg 80.1 kg    Examination: General: Sleepy, arousable chest: Mild crackles bilaterally, no wheezing CVS: S1, S2, regular, paced in telemetry Abdomen: Soft, nontender Extremities: trace edema on both feet Data Reviewed: I have personally reviewed following labs and imaging studies  CBC: Recent Labs  Lab 06/09/24 0539  WBC 6.6  NEUTROABS 5.2  HGB 9.1*  HCT 29.4*  MCV 93.3  PLT 79*    Basic Metabolic Panel: Recent Labs  Lab 06/07/24 0440 06/07/24 0846 06/08/24 0442 06/09/24 0539 06/10/24 0326 06/12/24 0425  NA 144  --  142 141 138 141  K 2.7*  --  3.5 3.5 3.5 3.4*  CL 102  --  102 101 98 101  CO2 34*  --  33* 25 31 34*  GLUCOSE 143*  --  127* 163* 159* 157*  BUN 44*  --  46* 49* 59* 58*  CREATININE 1.81*  --  1.76* 1.62* 1.75* 1.33*  CALCIUM  8.9  --  8.8* 8.8* 8.9 9.3  MG  --  2.1  --  2.3  --   --     GFR: Estimated Creatinine Clearance: 37.4 mL/min (A) (by C-G formula based on SCr of 1.33 mg/dL (H)).  Liver Function Tests: No results for input(s): AST, ALT, ALKPHOS, BILITOT, PROT, ALBUMIN  in the last 168 hours.   CBG: Recent Labs  Lab 06/11/24 1632 06/11/24 1948 06/11/24 2353 06/12/24 0339 06/12/24 0719  GLUCAP 180* 238* 202* 161* 170*    Recent Results (from the past 240 hours)  Urine Culture     Status: None   Collection Time: 06/03/24  2:16 PM   Specimen:  Urine, Random  Result Value Ref Range Status   Specimen Description   Final    URINE, RANDOM Performed at [facility], [address] [place], [place] [address]    Special Requests   Final    NONE Reflexed from [id number] Performed at [facility], [address], [place], [place] [address]    Culture   Final    NO GROWTH Performed at [facility], [address], [place], [place] [address]    Report Status 06/05/2024 FINAL  Final  Resp panel by RT-PCR (RSV, Flu A&B, Covid) Anterior Nasal Swab     Status: None   Collection Time: 06/03/24  2:28 PM   Specimen: Anterior Nasal Swab  Result Value Ref Range Status   SARS Coronavirus 2 by RT PCR NEGATIVE NEGATIVE Final    Comment: (NOTE) SARS-CoV-2 target nucleic acids are NOT DETECTED.  The SARS-CoV-2 RNA is generally detectable in upper respiratory specimens during the acute phase of infection. The lowest concentration of SARS-CoV-2 viral copies this assay can detect is 138 copies/mL. A  negative result does not preclude SARS-Cov-2 infection and should not be used as the sole basis for treatment or other patient management decisions. A negative result may occur with  improper specimen collection/handling, submission of specimen other than nasopharyngeal swab, presence of viral mutation(s) within the areas targeted by this assay, and inadequate number of viral copies(<138 copies/mL). A negative result must be combined with clinical observations, patient history, and epidemiological information. The expected result is Negative.  Fact Sheet for Patients:  bloggercourse.com  Fact Sheet for Healthcare Providers:  seriousbroker.it  This test is no t yet approved or cleared by the [place]  FDA and  has been authorized for detection and/or diagnosis of SARS-CoV-2 by FDA under an Emergency Use Authorization (EUA). This EUA will remain  in effect (meaning this test can be used) for  the duration of the COVID-19 declaration under Section 564(b)(1) of the Act, 21 U.S.C.section 360bbb-3(b)(1), unless the authorization is terminated  or revoked sooner.       Influenza A by PCR NEGATIVE NEGATIVE Final   Influenza B by PCR NEGATIVE NEGATIVE Final    Comment: (NOTE) The Xpert Xpress SARS-CoV-2/FLU/RSV plus assay is intended as an aid in the diagnosis of influenza from Nasopharyngeal swab specimens and should not be used as a sole basis for treatment. Nasal washings and aspirates are unacceptable for Xpert Xpress SARS-CoV-2/FLU/RSV testing.  Fact Sheet for Patients: bloggercourse.com  Fact Sheet for Healthcare Providers: seriousbroker.it  This test is not yet approved or cleared by the [place]  FDA and has been authorized for detection and/or diagnosis of SARS-CoV-2 by FDA under an Emergency Use Authorization (EUA). This EUA will remain in effect (meaning this test can be used) for the duration of the COVID-19 declaration under Section 564(b)(1) of the Act, 21 U.S.C. section 360bbb-3(b)(1), unless the authorization is terminated or revoked.     Resp Syncytial Virus by PCR NEGATIVE NEGATIVE Final    Comment: (NOTE) Fact Sheet for Patients: bloggercourse.com  Fact Sheet for Healthcare Providers: seriousbroker.it  This test is not yet approved or cleared by the [place]  FDA and has been authorized for detection and/or diagnosis of SARS-CoV-2 by FDA under an Emergency Use Authorization (EUA). This EUA will remain in effect (meaning this test can be used) for the duration of the COVID-19 declaration under Section 564(b)(1) of the Act, 21 U.S.C. section 360bbb-3(b)(1), unless the authorization is terminated or revoked.  Performed at [facility], [address] [place], [place] [address]   MRSA Next Gen by PCR, Nasal     Status: None   Collection Time:  06/03/24  9:00 PM   Specimen: Nasal Mucosa; Nasal Swab  Result Value Ref Range Status   MRSA by PCR Next Gen NOT DETECTED NOT DETECTED Final    Comment: (NOTE) The GeneXpert MRSA Assay (FDA approved for NASAL specimens only), is one component of a comprehensive MRSA colonization surveillance program. It is not intended to diagnose MRSA infection nor to guide or monitor treatment for MRSA infections. Test performance is not FDA approved in patients less than 24 years old. Performed at [facility], [address] [place], [place] [address]   Culture, blood (Routine X 2) w Reflex to ID Panel     Status: None   Collection Time: 06/03/24  9:10 PM   Specimen: BLOOD LEFT HAND  Result Value Ref Range Status   Specimen Description BLOOD LEFT HAND  Final   Special Requests   Final    BOTTLES DRAWN AEROBIC AND ANAEROBIC  Blood Culture adequate volume   Culture   Final    NO GROWTH 5 DAYS Performed at [facility], [address], [place], [place] [address]    Report Status 06/09/2024 FINAL  Final  Culture, blood (Routine X 2) w Reflex to ID Panel     Status: None   Collection Time: 06/04/24  4:43 AM   Specimen: BLOOD LEFT HAND  Result Value Ref Range Status   Specimen Description BLOOD LEFT HAND  Final   Special Requests   Final    BOTTLES DRAWN AEROBIC AND ANAEROBIC Blood Culture adequate volume   Culture   Final    NO GROWTH 5 DAYS Performed at [facility], [address] [place], [place] [address]    Report Status 06/09/2024 FINAL  Final    Radiology Studies: No results found.   Scheduled Meds:  aspirin  EC  81 mg Oral Daily   Chlorhexidine  Gluconate Cloth  6 each Topical Daily   ciprofloxacin   250 mg Oral Q24H   feeding supplement  237 mL Oral BID BM   furosemide   40 mg Oral BID   hydrocortisone  sod succinate (SOLU-CORTEF ) inj  50 mg Intravenous Q12H   midodrine   10 mg Oral TID WC   potassium chloride   20 mEq Oral BID   potassium chloride   40 mEq Oral Once   tamsulosin   0.4  mg Oral BID   Continuous Infusions:     LOS: 9 days     Kenyata Guess, MD [facility]   To contact the attending provider between 7A-7P or the covering provider during after hours 7P-7A, please log into the web site [email or web address] and access using universal [place] password for that web site. If you do not have the password, please call the hospital operator.  06/12/2024, 10:53 AM    "

## 2024-06-12 NOTE — Progress Notes (Signed)
 Palliative: Chart review completed.  Face-to-face discussion with bedside nursing staff.   At this point, Mr. Marty remains stable and is being transferred out of the intensive care.  He continues to have periods of confusion.  Family's goal is to continue to treat the treatable with time for outcomes.  Mr. Bathe has qualified for home health services, he lives with his spouse with son and daughter-in-law next-door.  If he is able to improve enough, the goal is to return home with home health.  Face-to-face discussion with bedside nursing staff and transition of care team related to patient condition, needs, goals of care, disposition.  Plan: Continue to treat the treatable but no CPR or intubation.  Time for outcomes.  If able to improve, home with home health.    DNR completed and placed on chart.   No charge  Lorenza Birkenhead, NP Palliative Medicine Team  Team Phone [phone]

## 2024-06-13 ENCOUNTER — Ambulatory Visit: Admitting: Student

## 2024-06-13 LAB — GLUCOSE, CAPILLARY
Glucose-Capillary: 156 mg/dL — ABNORMAL HIGH (ref 70–99)
Glucose-Capillary: 184 mg/dL — ABNORMAL HIGH (ref 70–99)
Glucose-Capillary: 196 mg/dL — ABNORMAL HIGH (ref 70–99)
Glucose-Capillary: 232 mg/dL — ABNORMAL HIGH (ref 70–99)
Glucose-Capillary: 245 mg/dL — ABNORMAL HIGH (ref 70–99)
Glucose-Capillary: 289 mg/dL — ABNORMAL HIGH (ref 70–99)
Glucose-Capillary: 328 mg/dL — ABNORMAL HIGH (ref 70–99)

## 2024-06-13 NOTE — TOC Progression Note (Signed)
 Transition of Care [facility]) - Progression Note    Patient Details  Name: Guy Roberson MRN: [id number] Date of Birth: Mar 05, 1932  Transition of Care [facility]) CM/SW Contact  Sharlyne Stabs, RN Phone Number: 06/13/2024, 3:33 PM  Clinical Narrative:   Discussed bed offers with family this morning, they want [facility]. IPCM following to see if medically ready to start insurance authorization.  MD cleared to start Auth, CMA started.    Expected Discharge Plan: Skilled Nursing Facility Barriers to Discharge: Insurance Authorization    Expected Discharge Plan and Services In-house Referral: Clinical Social Work Discharge Planning Services: NA Post Acute Care Choice: Resumption of Svcs/PTA Provider, Home Health Living arrangements for the past 2 months: Single Family Home                 DME Arranged: N/A DME Agency: NA        HH Agency: [facility]    Social Drivers of Health (SDOH) Interventions SDOH Screenings   Food Insecurity: Patient Unable To Answer (06/04/2024)  Housing: Low Risk (06/10/2024)  Transportation Needs: Patient Unable To Answer (06/04/2024)  Utilities: Patient Unable To Answer (06/04/2024)  Depression (PHQ2-9): Low Risk (08/17/2022)  Social Connections: Patient Unable To Answer (06/04/2024)  Tobacco Use: High Risk (06/03/2024)    Readmission Risk Interventions    06/05/2024   12:38 PM 04/07/2024    3:11 PM 04/02/2024   10:07 AM  Readmission Risk Prevention Plan  Transportation Screening Complete Complete Complete  PCP or Specialist Appt within 3-5 Days Complete    HRI or Home Care Consult Complete Complete Complete  Social Work Consult for Recovery Care Planning/Counseling Complete Complete Complete  Palliative Care Screening Not Applicable Not Applicable Not Applicable  Medication Review Oceanographer) Complete Complete Complete

## 2024-06-13 NOTE — Inpatient Diabetes Management (Signed)
 Inpatient Diabetes Program Recommendations  AACE/ADA: New Consensus Statement on Inpatient Glycemic Control (2015)  Target Ranges:  Prepandial:   less than 140 mg/dL      Peak postprandial:   less than 180 mg/dL (1-2 hours)      Critically ill patients:  140 - 180 mg/dL   Lab Results  Component Value Date   GLUCAP 196 (H) 06/13/2024   HGBA1C 6.7 (H) 01/27/2024    Review of Glycemic Control  Latest Reference Range & Units 06/12/24 11:27 06/12/24 16:14 06/12/24 19:38 06/13/24 00:05 06/13/24 04:12 06/13/24 07:44 06/13/24 11:37  Glucose-Capillary 70 - 99 mg/dL 851 (H) 807 (H) 635 (H) 289 (H) 156 (H) 184 (H) 196 (H)   Diabetes history: DM 2  Current orders for Inpatient glycemic control:  None Solucortef 50 mg Q12 Ensure Plus High Protein (19 grams of carbohydrates) Hypoglycemia during admission Some permissible hyperglycemia Renal function slightly elevated   Inpatient Diabetes Program Recommendations:    May consult dietitian to see if another lower carb supplement is more appropriate for pt to prevent hyperglycemia.  Thanks,  Clotilda Bull RN, MSN, BC-ADM Inpatient Diabetes Coordinator Team Pager [phone] (8a-5p)

## 2024-06-13 NOTE — Care Management Important Message (Signed)
 Important Message  Patient Details  Name: Guy Roberson MRN: [id number] Date of Birth: 09/14/1931   Important Message Given:  Yes - Medicare IM     [name] 06/13/2024, 5:05 PM

## 2024-06-13 NOTE — Progress Notes (Signed)
 " PROGRESS NOTE    Guy Roberson  FMW:[id number] DOB: May 19, 1932 DOA: 06/03/2024  PCP: [name], MD    Chief Complaint  Patient presents with   Shortness of Breath    Brief admission narrative:  Guy Roberson is a 89 y.o. male with medical history significant for CHF, severe valvular issues, severe TR, moderate MR, pulmonary hypertension, CKD, hypertension, diabetes mellitus, complete heart block pacemaker status. Patient presented to the ED with complaints of weight gain of 8 pounds over the past 1 week, with abdominal distention. Also AMS  Hospitalized 10/28 to 11/7 also for CHF, diuresed with IV Lasix .  proBNP 3949.   Admitted for acute on chronic diastolic heart failure, altered mental status.  Diuresed with several days of IV Lasix  before switching to oral Lasix .  Hospital course notable for hypotension requiring vasopressors, now on midodrine .  Significant delirium.  Assessment & Plan:  Acute on chronic diastolic CHF (congestive heart failure) (HCC) Mild systolic heart failure, EF 50% per echocardiogram echo 2025 Severe MR and hypertension Moderate MR Severe TR -Has been having recurrent issues recently. -Patient was told in the past that he was not a surgical candidate for fixing valves. -Continue diuresis with Lasix , switch to p.o. 40 mg twice daily on 1/12. - Follow daily weights/strict intake and output. -BP has been borderline but is staying steady over the past several days.- Continue to follow - -Monitor renal function and electrolytes closely.   -- Palliative is on board.  Plan to continue current management with no escalation of care if declining. - Transfer out of ICU 1/12 -Pursuing rehab  Hyperkalemia Replacing as needed.  Was as low as 2.7.  Acute metabolic encephalopathy/hospital-acquired delirium -exacerbated by UTI and ongoing illness - Continue constant orientation - Avoid sedative agents and continue constant reorientation  Hypothermia:  Required Bair hugger/warm blankets.  Urinary tract infection without hematuria Patient with recurrent UTIs - Suppression antibiotic given at baseline - Both urine and blood culture negative.  Ceftriaxone  discontinued, on suppression with Cipro .  Dose change per renal function - Foley has been discontinued   Essential hypertension - Was on Levophed , currently off. Midodrine  added 1/8 due to borderline low BP.  Will continue 10 mg 3 times daily.  Received IV albumin  1/8  Type 2 diabetes/hypoglycemia: Was on D10 drip for many days, discontinued 1/9, blood sugars improving with oral intake.  H/o Complete heart block (HCC) -Status post pacemaker implantation - Continue telemetry monitoring - Follow electrolytes stability and follow  CKD stage 3B baseline GFR results. -Appears to be stable - Continue to follow renal function trend - Continue to minimize nephrotoxic agents  Thrombocytopenia - Probably associated with acute infection - No overt bleeding appreciated on exam - Follow platelet counts trend and continue minimizing/avoiding the use of heparin  products.  Social/ethics - Patient DNR/DNI -Overall condition/prognosis guarded - Palliative care has been consulted and will follow further recommendations. Current goal is to continue current management with hopes of improvement.  Fortunately improving.  No escalation of care.  DVT prophylaxis: SCDs Code Status: DNR/DNI. Family Communication: Son and daughter in law Disposition: Pursuing SNF.  Status is: Inpatient Remains inpatient, continuing diuresis, remains hypotensive requiring close management.  consultants:  None  Procedures:  See below for x-ray reports.  Antimicrobials:  Ceftriaxone    Subjective: Patient was seen and examined earlier today.  He was alert and conversant but confused. Son and daughter-in-law at the bedside.  No acute issues overnight.  objective: Vitals:   06/12/24  1623 06/12/24 1937  06/13/24 0004 06/13/24 0410  BP:  98/76 (!) 118/54 115/69  Pulse:  (!) 59 74 76  Resp:  20 18 18   Temp: (!) 95.6 F (35.3 C) 97.8 F (36.6 C) 98.6 F (37 C) 98 F (36.7 C)  TempSrc: Rectal Rectal Rectal Rectal  SpO2:  96% 95% 93%  Weight:    81.6 kg  Height:        Intake/Output Summary (Last 24 hours) at 06/13/2024 1355 Last data filed at 06/13/2024 0300 Gross per 24 hour  Intake --  Output 400 ml  Net -400 ml   Filed Weights   06/11/24 0445 06/12/24 0421 06/13/24 0410  Weight: 81.7 kg 80.1 kg 81.6 kg    Examination: General: Alert, confused. chest: Mild crackles bilaterally, no wheezing CVS: S1, S2, regular, M+, Abdomen: Soft, nontender Extremities: trace edema on both feet  Data Reviewed: I have personally reviewed following labs and imaging studies  CBC: Recent Labs  Lab 06/09/24 0539  WBC 6.6  NEUTROABS 5.2  HGB 9.1*  HCT 29.4*  MCV 93.3  PLT 79*    Basic Metabolic Panel: Recent Labs  Lab 06/07/24 0440 06/07/24 0846 06/08/24 0442 06/09/24 0539 06/10/24 0326 06/12/24 0425  NA 144  --  142 141 138 141  K 2.7*  --  3.5 3.5 3.5 3.4*  CL 102  --  102 101 98 101  CO2 34*  --  33* 25 31 34*  GLUCOSE 143*  --  127* 163* 159* 157*  BUN 44*  --  46* 49* 59* 58*  CREATININE 1.81*  --  1.76* 1.62* 1.75* 1.33*  CALCIUM  8.9  --  8.8* 8.8* 8.9 9.3  MG  --  2.1  --  2.3  --   --     GFR: Estimated Creatinine Clearance: 37.8 mL/min (A) (by C-G formula based on SCr of 1.33 mg/dL (H)).  Liver Function Tests: No results for input(s): AST, ALT, ALKPHOS, BILITOT, PROT, ALBUMIN  in the last 168 hours.   CBG: Recent Labs  Lab 06/12/24 1938 06/13/24 0005 06/13/24 0412 06/13/24 0744 06/13/24 1137  GLUCAP 364* 289* 156* 184* 196*    Recent Results (from the past 240 hours)  Urine Culture     Status: None   Collection Time: 06/03/24  2:16 PM   Specimen: Urine, Random  Result Value Ref Range Status   Specimen Description   Final    URINE,  RANDOM Performed at [facility], [address] [place], [place] [address]    Special Requests   Final    NONE Reflexed from [id number] Performed at [facility], [address] [place], [place] [address]    Culture   Final    NO GROWTH Performed at [facility], [address], [place], [place] [address]    Report Status 06/05/2024 FINAL  Final  Resp panel by RT-PCR (RSV, Flu A&B, Covid) Anterior Nasal Swab     Status: None   Collection Time: 06/03/24  2:28 PM   Specimen: Anterior Nasal Swab  Result Value Ref Range Status   SARS Coronavirus 2 by RT PCR NEGATIVE NEGATIVE Final    Comment: (NOTE) SARS-CoV-2 target nucleic acids are NOT DETECTED.  The SARS-CoV-2 RNA is generally detectable in upper respiratory specimens during the acute phase of infection. The lowest concentration of SARS-CoV-2 viral copies this assay can detect is 138 copies/mL. A negative result does not preclude SARS-Cov-2 infection and should not be used as the sole  basis for treatment or other patient management decisions. A negative result may occur with  improper specimen collection/handling, submission of specimen other than nasopharyngeal swab, presence of viral mutation(s) within the areas targeted by this assay, and inadequate number of viral copies(<138 copies/mL). A negative result must be combined with clinical observations, patient history, and epidemiological information. The expected result is Negative.  Fact Sheet for Patients:  bloggercourse.com  Fact Sheet for Healthcare Providers:  seriousbroker.it  This test is no t yet approved or cleared by the [place]  FDA and  has been authorized for detection and/or diagnosis of SARS-CoV-2 by FDA under an Emergency Use Authorization (EUA). This EUA will remain  in effect (meaning this test can be used) for the duration of the COVID-19 declaration under Section 564(b)(1) of the Act,  21 U.S.C.section 360bbb-3(b)(1), unless the authorization is terminated  or revoked sooner.       Influenza A by PCR NEGATIVE NEGATIVE Final   Influenza B by PCR NEGATIVE NEGATIVE Final    Comment: (NOTE) The Xpert Xpress SARS-CoV-2/FLU/RSV plus assay is intended as an aid in the diagnosis of influenza from Nasopharyngeal swab specimens and should not be used as a sole basis for treatment. Nasal washings and aspirates are unacceptable for Xpert Xpress SARS-CoV-2/FLU/RSV testing.  Fact Sheet for Patients: bloggercourse.com  Fact Sheet for Healthcare Providers: seriousbroker.it  This test is not yet approved or cleared by the [place]  FDA and has been authorized for detection and/or diagnosis of SARS-CoV-2 by FDA under an Emergency Use Authorization (EUA). This EUA will remain in effect (meaning this test can be used) for the duration of the COVID-19 declaration under Section 564(b)(1) of the Act, 21 U.S.C. section 360bbb-3(b)(1), unless the authorization is terminated or revoked.     Resp Syncytial Virus by PCR NEGATIVE NEGATIVE Final    Comment: (NOTE) Fact Sheet for Patients: bloggercourse.com  Fact Sheet for Healthcare Providers: seriousbroker.it  This test is not yet approved or cleared by the [place]  FDA and has been authorized for detection and/or diagnosis of SARS-CoV-2 by FDA under an Emergency Use Authorization (EUA). This EUA will remain in effect (meaning this test can be used) for the duration of the COVID-19 declaration under Section 564(b)(1) of the Act, 21 U.S.C. section 360bbb-3(b)(1), unless the authorization is terminated or revoked.  Performed at [facility], [address], [place], [place] [address]   MRSA Next Gen by PCR, Nasal     Status: None   Collection Time: 06/03/24  9:00 PM   Specimen: Nasal Mucosa; Nasal Swab  Result Value Ref  Range Status   MRSA by PCR Next Gen NOT DETECTED NOT DETECTED Final    Comment: (NOTE) The GeneXpert MRSA Assay (FDA approved for NASAL specimens only), is one component of a comprehensive MRSA colonization surveillance program. It is not intended to diagnose MRSA infection nor to guide or monitor treatment for MRSA infections. Test performance is not FDA approved in patients less than 69 years old. Performed at [facility], [address], [place], [place] [address]   Culture, blood (Routine X 2) w Reflex to ID Panel     Status: None   Collection Time: 06/03/24  9:10 PM   Specimen: BLOOD LEFT HAND  Result Value Ref Range Status   Specimen Description BLOOD LEFT HAND  Final   Special Requests   Final    BOTTLES DRAWN AEROBIC AND ANAEROBIC Blood Culture adequate volume   Culture   Final    NO GROWTH  5 DAYS Performed at [facility], [address] [place], [place] [address]    Report Status 06/09/2024 FINAL  Final  Culture, blood (Routine X 2) w Reflex to ID Panel     Status: None   Collection Time: 06/04/24  4:43 AM   Specimen: BLOOD LEFT HAND  Result Value Ref Range Status   Specimen Description BLOOD LEFT HAND  Final   Special Requests   Final    BOTTLES DRAWN AEROBIC AND ANAEROBIC Blood Culture adequate volume   Culture   Final    NO GROWTH 5 DAYS Performed at [facility], [address] [place], [place] [address]    Report Status 06/09/2024 FINAL  Final    Radiology Studies: No results found.   Scheduled Meds:  aspirin  EC  81 mg Oral Daily   Chlorhexidine  Gluconate Cloth  6 each Topical Daily   ciprofloxacin   250 mg Oral Q24H   feeding supplement  237 mL Oral BID BM   furosemide   40 mg Oral BID   hydrocortisone  sod succinate (SOLU-CORTEF ) inj  50 mg Intravenous Q12H   midodrine   10 mg Oral TID WC   potassium chloride   40 mEq Oral Once   tamsulosin   0.4 mg Oral BID   Continuous Infusions:     LOS: 10 days     Aalliyah Kilker, MD [facility]   To  contact the attending provider between 7A-7P or the covering provider during after hours 7P-7A, please log into the web site [email or web address] and access using universal [place] password for that web site. If you do not have the password, please call the hospital operator.  06/13/2024, 1:55 PM    "

## 2024-06-13 NOTE — Progress Notes (Signed)
 Palliative: Guy Roberson is lying quietly in bed.  He appears acutely/chronically ill and quite frail.  He is very HOH.  He greets me, making but not keeping eye contact.  He is alert, and I believe able to make his needs known if given enough time.  He does not understand my questions due to his profound hearing loss.  He denies needs at this time.  I share that I will reach out to his daughter-in-law.  Call to daughter-in-law Guy Roberson.  We talked about the plan for [facility] for short term rehab.  Guy Roberson states that her mother has been in [facility] for the last 11 years.  We talked about a plan for after rehab.  Guy Roberson tells me that Guy Roberson is planned to return to his own home, with his wife, with people in the home to help care for him.  Guy Roberson shares that they had a scheduled visit with outpatient palliative services for 1/14.  Guy Roberson tells me that she is unsure of the provider, but thinks it is through his insurance.  Guy Roberson tells me that she has let the outpatient palliative team know if his hospital stay and they have agreed to reschedule once Guy Roberson returns home.  Conference with attending, bedside nursing staff, transition of care team related to patient condition, needs, goals of care, disposition.  Plan: Continue to treat the treatable but no CPR or intubation.  Short-term rehab at [facility] with ultimate goal of returning home.  Has caregivers at home as needed.  To reschedule intake appointment with outpatient palliative services (unable to name provider) once home.  50 minutes  Shiv Shuey, NP Palliative medicine team Team phone [phone]

## 2024-06-13 NOTE — Plan of Care (Signed)
   Problem: Activity: Goal: Risk for activity intolerance will decrease Outcome: Progressing   Problem: Coping: Goal: Level of anxiety will decrease Outcome: Progressing

## 2024-06-13 NOTE — Progress Notes (Signed)
 Speech Language Pathology Treatment: Dysphagia  Patient Details Name: Guy Roberson MRN: [id number] DOB: April 22, 1932 Today's Date: 06/13/2024 Time: 8277-8264 SLP Time Calculation (min) (ACUTE ONLY): 13 min  Assessment / Plan / Recommendation Clinical Impression  Pt seen after his dinner meal and he had eaten ~75% of his meal and declined further solid/semisolids. He consumed thin liquids via straw sips with mild wet vocal quality, but cleared with SLP cue to cough. No overt signs of aspiration and no reports of globus. Pt reports having more difficulty masticating meats, but stated that the ground meats were okay.  Ok to continue diet as ordered, D2 and thin liquids via cup/straw and PO medication crushed as able in puree when Pt is alert and upright. SLP will sign off.     HPI HPI: Guy Roberson is a 89 y.o. male with medical history significant for CHF, CKD, hypertension, diabetes mellitus, complete heart block pacemaker status. Patient presented to the ED on 06/03/24 with complaints of weight gain of 8 pounds over the past 1 week, with abdominal distention. Bilateral lower extremity edema is chronic and at baseline. Son also reports that patient's speech was confused earlier today-saying things that did not make sense. He has been compliant with Lasix  40-60 mg daily. CXR indicated bibasilar opacities vs atelectasis or effusion. Head CT negative for acute changes. ST consulted for clinical swallow evaluation. Pt currently on Dysphagia 2(minced)/thin liquid diet; BSE yielded need for Dysphagia 1/thin with ST f/u for diet progression/dysphagia tx/education during acute stay.      SLP Plan  Continue with current plan of care        Swallow Evaluation Recommendations   Recommendations: PO diet PO Diet Recommendation: Dysphagia 2 (Finely chopped);Thin liquids (Level 0) Liquid Administration via: Cup;Straw Medication Administration: Crushed with puree Supervision: Staff to assist with  self-feeding Postural changes: Position pt fully upright for meals;Stay upright 30-60 min after meals Oral care recommendations: Oral care BID (2x/day);Staff/trained caregiver to provide oral care     Recommendations                     Oral care QID;Staff/trained caregiver to provide oral care   Frequent or constant Supervision/Assistance Dysphagia, unspecified (R13.10)     Continue with current plan of care   Thank you,  Lamar Candy, CCC-SLP [phone]   Amiliana Foutz  06/13/2024, 6:04 PM

## 2024-06-14 DIAGNOSIS — I5033 Acute on chronic diastolic (congestive) heart failure: Secondary | ICD-10-CM | POA: Diagnosis not present

## 2024-06-14 LAB — GLUCOSE, CAPILLARY
Glucose-Capillary: 183 mg/dL — ABNORMAL HIGH (ref 70–99)
Glucose-Capillary: 180 mg/dL — ABNORMAL HIGH (ref 70–99)
Glucose-Capillary: 273 mg/dL — ABNORMAL HIGH (ref 70–99)
Glucose-Capillary: 252 mg/dL — ABNORMAL HIGH (ref 70–99)
Glucose-Capillary: 267 mg/dL — ABNORMAL HIGH (ref 70–99)

## 2024-06-14 NOTE — Progress Notes (Signed)
 Physical Therapy Treatment Patient Details Name: Guy Roberson MRN: [id number] DOB: [birth year] Today's Date: 06/14/2024   History of Present Illness Guy Roberson is a 89 y.o. male with medical history significant for CHF, CKD, hypertension, diabetes mellitus, complete heart block pacemaker status.  Patient presented to the ED with complaints of weight gain of 8 pounds over the past 1 week, with abdominal distention.  Bilateral lower extremity edema is chronic and at baseline.  Son also reports that patient's speech was confused earlier today-saying things that did not make sense.  He has been compliant with Lasix  40-60 mg daily.    PT Comments  Patient requires increased time for sitting up at bedside with most difficulty scooting to EOB, very unsteady on feet and limited to a few side steps at bedside before having to sit due to BLE weakness, fatigue and fall risk. Patient tolerated sitting up in chair after therapy. Patient will benefit from continued skilled physical therapy in hospital and recommended venue below to increase strength, balance, endurance for safe ADLs and gait.      If plan is discharge home, recommend the following: A lot of help with bathing/dressing/bathroom;Assistance with cooking/housework;Assist for transportation;Help with stairs or ramp for entrance;A lot of help with walking and/or transfers   Can travel by private vehicle     No  Equipment Recommendations  None recommended by PT    Recommendations for Other Services       Precautions / Restrictions Precautions Precautions: Fall Recall of Precautions/Restrictions: Impaired Restrictions Weight Bearing Restrictions Per Provider Order: No     Mobility  Bed Mobility Overal bed mobility: Needs Assistance Bed Mobility: Supine to Sit     Supine to sit: Min assist, Mod assist, HOB elevated     General bed mobility comments: slow labored movement with most difficulty scooting to EOB     Transfers Overall transfer level: Needs assistance Equipment used: Rolling walker (2 wheels) Transfers: Sit to/from Stand, Bed to chair/wheelchair/BSC Sit to Stand: Mod assist   Step pivot transfers: Mod assist       General transfer comment: slo wlabored movemen with buckling of knees due to weakness    Ambulation/Gait Ambulation/Gait assistance: Mod assist Gait Distance (Feet): 4 Feet Assistive device: Rolling walker (2 wheels) Gait Pattern/deviations: Decreased step length - right, Decreased step length - left, Decreased stride length, Knees buckling Gait velocity: slow     General Gait Details: limited to a few side steps at bedside due to weakness, poor standing balance and c/o fatigue   Stairs             Wheelchair Mobility     Tilt Bed    Modified Rankin (Stroke Patients Only)       Balance Overall balance assessment: Needs assistance Sitting-balance support: Feet supported, No upper extremity supported Sitting balance-Leahy Scale: Fair Sitting balance - Comments: seated at EOB   Standing balance support: Bilateral upper extremity supported, During functional activity, Reliant on assistive device for balance Standing balance-Leahy Scale: Poor Standing balance comment: using RW                            Communication Communication Communication: Impaired Factors Affecting Communication: Hearing impaired;Other (comment);Reduced clarity of speech  Cognition Arousal: Alert Behavior During Therapy: WFL for tasks assessed/performed   PT - Cognitive impairments: History of cognitive impairments  Following commands: Impaired Following commands impaired: Follows one step commands with increased time    Cueing Cueing Techniques: Verbal cues, Tactile cues  Exercises General Exercises - Lower Extremity Ankle Circles/Pumps: AROM, Seated, Strengthening, 5 reps, Both Long Arc Quad: AROM, 5 reps, Both,  Seated Hip Flexion/Marching: Seated, AROM, Strengthening, Both, 5 reps    General Comments        Pertinent Vitals/Pain Pain Assessment Pain Assessment: No/denies pain    Home Living                          Prior Function            PT Goals (current goals can now be found in the care plan section) Acute Rehab PT Goals Time For Goal Achievement: 07/29/24 Potential to Achieve Goals: Good    Frequency    Min 3X/week      PT Plan      Co-evaluation              AM-PAC PT 6 Clicks Mobility   Outcome Measure  Help needed turning from your back to your side while in a flat bed without using bedrails?: A Little Help needed moving from lying on your back to sitting on the side of a flat bed without using bedrails?: A Lot Help needed moving to and from a bed to a chair (including a wheelchair)?: A Lot Help needed standing up from a chair using your arms (e.g., wheelchair or bedside chair)?: A Lot Help needed to walk in hospital room?: A Lot Help needed climbing 3-5 steps with a railing? : Total 6 Click Score: 12    End of Session   Activity Tolerance: Patient tolerated treatment well;Patient limited by fatigue Patient left: in chair;with call bell/phone within reach;with chair alarm set Nurse Communication: Mobility status PT Visit Diagnosis: Unsteadiness on feet (R26.81);Muscle weakness (generalized) (M62.81);Other abnormalities of gait and mobility (R26.89)     Time: 8870-8846 PT Time Calculation (min) (ACUTE ONLY): 24 min  Charges:    $Therapeutic Exercise: 8-22 mins $Therapeutic Activity: 8-22 mins PT General Charges $$ ACUTE PT VISIT: 1 Visit                     2:41 PM, 06/14/2024 Lynwood Music, MPT Physical Therapist with [facility] [phone] office [phone] mobile phone

## 2024-06-14 NOTE — Plan of Care (Signed)
" °  Problem: Acute Rehab PT Goals(only PT should resolve) Goal: Pt Will Go Supine/Side To Sit Outcome: Progressing Flowsheets (Taken 06/14/2024 1443) Pt will go Supine/Side to Sit:  with minimal assist  with contact guard assist Goal: Patient Will Transfer Sit To/From Stand Outcome: Progressing Flowsheets (Taken 06/14/2024 1443) Patient will transfer sit to/from stand: with minimal assist Goal: Pt Will Transfer Bed To Chair/Chair To Bed Outcome: Progressing Flowsheets (Taken 06/14/2024 1443) Pt will Transfer Bed to Chair/Chair to Bed: with min assist Goal: Pt Will Ambulate Outcome: Progressing Flowsheets (Taken 06/14/2024 1443) Pt will Ambulate:  15 feet  with moderate assist  with rolling walker  with minimal assist   2:44 PM, 06/14/2024 Lynwood Music, MPT Physical Therapist with [facility] [phone] office [phone] mobile phone  "

## 2024-06-14 NOTE — Plan of Care (Signed)
  Problem: Education: Goal: Knowledge of General Education information will improve Description: Including pain rating scale, medication(s)/side effects and non-pharmacologic comfort measures Outcome: Progressing   Problem: Clinical Measurements: Goal: Ability to maintain clinical measurements within normal limits will improve Outcome: Progressing Goal: Will remain free from infection Outcome: Progressing Goal: Diagnostic test results will improve Outcome: Progressing Goal: Respiratory complications will improve Outcome: Progressing Goal: Cardiovascular complication will be avoided Outcome: Progressing   Problem: Coping: Goal: Level of anxiety will decrease Outcome: Progressing   Problem: Pain Managment: Goal: General experience of comfort will improve and/or be controlled Outcome: Progressing   Problem: Safety: Goal: Ability to remain free from injury will improve Outcome: Progressing

## 2024-06-14 NOTE — TOC Progression Note (Signed)
 Transition of Care [facility]) - Progression Note    Patient Details  Name: Guy Roberson MRN: [id number] Date of Birth: 06/15/1931  Transition of Care [facility]) CM/SW Contact  Mcarthur Saddie Kim, [place] Phone Number: 06/14/2024, 2:07 PM  Clinical Narrative: Shara approved 1/14-1/16. Anticipate d/c tomorrow. PNC updated.       Expected Discharge Plan: Skilled Nursing Facility Barriers to Discharge: Continued Medical Work up               Expected Discharge Plan and Services In-house Referral: Clinical Social Work Discharge Planning Services: NA Post Acute Care Choice: Resumption of Svcs/PTA Provider, Home Health Living arrangements for the past 2 months: Single Family Home                 DME Arranged: N/A DME Agency: NA         HH Agency: [facility]         Social Drivers of Health (SDOH) Interventions SDOH Screenings   Food Insecurity: Patient Unable To Answer (06/04/2024)  Housing: Low Risk (06/10/2024)  Transportation Needs: Patient Unable To Answer (06/04/2024)  Utilities: Patient Unable To Answer (06/04/2024)  Depression (PHQ2-9): Low Risk (08/17/2022)  Social Connections: Patient Unable To Answer (06/04/2024)  Tobacco Use: High Risk (06/03/2024)    Readmission Risk Interventions    06/05/2024   12:38 PM 04/07/2024    3:11 PM 04/02/2024   10:07 AM  Readmission Risk Prevention Plan  Transportation Screening Complete Complete Complete  PCP or Specialist Appt within 3-5 Days Complete    HRI or Home Care Consult Complete Complete Complete  Social Work Consult for Recovery Care Planning/Counseling Complete Complete Complete  Palliative Care Screening Not Applicable Not Applicable Not Applicable  Medication Review Oceanographer) Complete Complete Complete

## 2024-06-14 NOTE — Progress Notes (Signed)
 Heart Failure Navigator Progress Note  Assessed for Heart & Vascular TOC clinic readiness. Patient is very HOH.  Per notes patient will have short term rehab @ [facility] following this hospitalization with goals of retuning home..  Then he will return home.  They had an outpatient palliative visit scheduled for 06/14/24 and since he was hospitalized again they were notified and it will be rescheduled.   Navigator available for reassessment of patient. No Advanced Heart Failure Clinic TOC scheduled at this time.  Charmaine Pines, RN, BSN [facility] Heart Failure Navigator Secure Chat Only

## 2024-06-14 NOTE — Progress Notes (Signed)
 " PROGRESS NOTE    Guy Roberson  FMW:[id number] DOB: 1931-11-08 DOA: 06/03/2024 PCP: [name], MD   Brief Narrative:   Guy Roberson is a 89 y.o. male with medical history significant for CHF, severe valvular issues, severe TR, moderate MR, pulmonary hypertension, CKD, hypertension, diabetes mellitus, complete heart block pacemaker status. Patient presented to the ED with complaints of weight gain of 8 pounds over the past 1 week, with abdominal distention. Also AMS  Hospitalized 10/28 to 11/7 also for CHF, diuresed with IV Lasix .  proBNP 3949.   Admitted for acute on chronic diastolic heart failure, altered mental status.  Diuresed with several days of IV Lasix  before switching to oral Lasix .  Hospital course notable for hypotension requiring vasopressors, now on midodrine .  Significant delirium.  Patient still on Solu-Cortef  as well as midodrine .  Solu-Cortef  will be weaned to off and blood pressures will be monitored.  Patient is still awaiting authorization for SNF as well.  Assessment & Plan:   Principal Problem:   Acute on chronic diastolic CHF (congestive heart failure) (HCC) Active Problems:   Acute metabolic encephalopathy   Urinary tract infection without hematuria   Type 2 diabetes mellitus with other circulatory complications (HCC)   Essential hypertension   Complete heart block (HCC)   Pacemaker-[employer]onic   CKD stage 3a, GFR 45-59 ml/min (HCC)   Thrombocytopenia  Assessment and Plan:   Acute on chronic diastolic CHF (congestive heart failure) (HCC) Mild systolic heart failure, EF 50% per echocardiogram echo 2025 Severe MR and hypertension Moderate MR Severe TR -Has been having recurrent issues recently. -Patient was told in the past that he was not a surgical candidate for fixing valves. -Continue diuresis with Lasix , switch to p.o. 40 mg twice daily on 1/12. - Follow daily weights/strict intake and output. -BP has been borderline but is staying steady  over the past several days.- Continue to follow - -Monitor renal function and electrolytes closely.   -- Palliative is on board.  Plan to continue current management with no escalation of care if declining. - Transfer out of ICU 1/12 -Pursuing rehab and awaiting auth   Hyperkalemia Replacing as needed.  Was as low as 2.7.   Acute metabolic encephalopathy/hospital-acquired delirium -exacerbated by UTI and ongoing illness - Continue constant orientation - Avoid sedative agents and continue constant reorientation   Hypothermia: Required Bair hugger/warm blankets.   Urinary tract infection without hematuria Patient with recurrent UTIs - Suppression antibiotic given at baseline - Both urine and blood culture negative.  Ceftriaxone  discontinued, on suppression with Cipro .  Dose change per renal function - Foley has been discontinued    Essential hypertension - Was on Levophed , currently off. Midodrine  added 1/8 due to borderline low BP.  Will continue 10 mg 3 times daily.  Received IV albumin  1/8 - Discontinue Solu-Cortef  1/14   Type 2 diabetes/hypoglycemia: Was on D10 drip for many days, discontinued 1/9, blood sugars improving with oral intake.   H/o Complete heart block (HCC) -Status post pacemaker implantation - Continue telemetry monitoring - Follow electrolytes stability and follow   CKD stage 3B baseline GFR results. -Appears to be stable - Continue to follow renal function trend - Continue to minimize nephrotoxic agents   Thrombocytopenia - Probably associated with acute infection - No overt bleeding appreciated on exam - Follow platelet counts trend and continue minimizing/avoiding the use of heparin  products. -Monitor in am -DC aspirin    Social/ethics - Patient DNR/DNI -Overall condition/prognosis guarded - Palliative care  has been consulted and will follow further recommendations. Current goal is to continue current management with hopes of improvement.   Fortunately improving.  No escalation of care.   DVT prophylaxis: SCDs Code Status: DNR Family Communication: None at bedside Disposition Plan:  Status is: Inpatient Remains inpatient appropriate because: Need for IV medications.  Consultants:  None  Procedures:  None  Antimicrobials:  Anti-infectives (From admission, onward)    Start     Dose/Rate Route Frequency Ordered Stop   06/09/24 1000  ciprofloxacin  (CIPRO ) tablet 250 mg       Note to Pharmacy: X7 days starting 05/29/24     250 mg Oral Every 24 hours 06/08/24 1108     06/07/24 2200  ciprofloxacin  (CIPRO ) tablet 250 mg  Status:  Discontinued       Note to Pharmacy: X7 days starting 05/29/24     250 mg Oral Every 12 hours 06/07/24 1658 06/08/24 1108   06/04/24 1700  cefTRIAXone  (ROCEPHIN ) 1 g in sodium chloride  0.9 % 100 mL IVPB  Status:  Discontinued        1 g 200 mL/hr over 30 Minutes Intravenous Every 24 hours 06/03/24 2017 06/04/24 0325   06/04/24 1700  cefTRIAXone  (ROCEPHIN ) 2 g in sodium chloride  0.9 % 100 mL IVPB  Status:  Discontinued        2 g 200 mL/hr over 30 Minutes Intravenous Every 24 hours 06/04/24 0325 06/07/24 1657   06/03/24 1700  cefTRIAXone  (ROCEPHIN ) 1 g in sodium chloride  0.9 % 100 mL IVPB        1 g 200 mL/hr over 30 Minutes Intravenous  Once 06/03/24 1656 06/03/24 1738      Subjective: Patient seen and evaluated today with no new acute complaints or concerns. No acute concerns or events noted overnight.  Objective: Vitals:   06/13/24 1425 06/13/24 1929 06/14/24 0439 06/14/24 0513  BP: 122/68 (!) 152/66  132/72  Pulse:  85  96  Resp: 18 18  18   Temp: (!) 96.4 F (35.8 C) (!) 97.5 F (36.4 C)  (!) 97.4 F (36.3 C)  TempSrc: Rectal Oral  Oral  SpO2: 98% 95%  92%  Weight:   76.7 kg   Height:       No intake or output data in the 24 hours ending 06/14/24 1326 Filed Weights   06/12/24 0421 06/13/24 0410 06/14/24 0439  Weight: 80.1 kg 81.6 kg 76.7 kg    Examination:  General  exam: Appears calm and comfortable  Respiratory system: Clear to auscultation. Respiratory effort normal. Cardiovascular system: S1 & S2 heard, RRR.  Gastrointestinal system: Abdomen is soft Central nervous system: Alert and awake Extremities: No edema Skin: No significant lesions noted Psychiatry: Flat affect.    Data Reviewed: I have personally reviewed following labs and imaging studies  CBC: Recent Labs  Lab 06/09/24 0539  WBC 6.6  NEUTROABS 5.2  HGB 9.1*  HCT 29.4*  MCV 93.3  PLT 79*   Basic Metabolic Panel: Recent Labs  Lab 06/08/24 0442 06/09/24 0539 06/10/24 0326 06/12/24 0425  NA 142 141 138 141  K 3.5 3.5 3.5 3.4*  CL 102 101 98 101  CO2 33* 25 31 34*  GLUCOSE 127* 163* 159* 157*  BUN 46* 49* 59* 58*  CREATININE 1.76* 1.62* 1.75* 1.33*  CALCIUM  8.8* 8.8* 8.9 9.3  MG  --  2.3  --   --    GFR: Estimated Creatinine Clearance: 34.5 mL/min (A) (by C-G formula based on SCr  of 1.33 mg/dL (H)). Liver Function Tests: No results for input(s): AST, ALT, ALKPHOS, BILITOT, PROT, ALBUMIN  in the last 168 hours. No results for input(s): LIPASE, AMYLASE in the last 168 hours. No results for input(s): AMMONIA in the last 168 hours. Coagulation Profile: No results for input(s): INR, PROTIME in the last 168 hours. Cardiac Enzymes: No results for input(s): CKTOTAL, CKMB, CKMBINDEX, TROPONINI in the last 168 hours. BNP (last 3 results) Recent Labs    04/03/24 0429 04/13/24 0430 06/03/24 1416  PROBNP 10,093.0* 5,179.0* 3,722.0*   HbA1C: No results for input(s): HGBA1C in the last 72 hours. CBG: Recent Labs  Lab 06/13/24 1926 06/13/24 2357 06/14/24 0406 06/14/24 0808 06/14/24 1114  GLUCAP 245* 232* 183* 180* 273*   Lipid Profile: No results for input(s): CHOL, HDL, LDLCALC, TRIG, CHOLHDL, LDLDIRECT in the last 72 hours. Thyroid Function Tests: No results for input(s): TSH, T4TOTAL, FREET4, T3FREE,  THYROIDAB in the last 72 hours. Anemia Panel: No results for input(s): VITAMINB12, FOLATE, FERRITIN, TIBC, IRON, RETICCTPCT in the last 72 hours. Sepsis Labs: No results for input(s): PROCALCITON, LATICACIDVEN in the last 168 hours.  No results found for this or any previous visit (from the past 240 hours).       Radiology Studies: No results found.      Scheduled Meds:  aspirin  EC  81 mg Oral Daily   Chlorhexidine  Gluconate Cloth  6 each Topical Daily   ciprofloxacin   250 mg Oral Q24H   feeding supplement  237 mL Oral BID BM   furosemide   40 mg Oral BID   midodrine   10 mg Oral TID WC   potassium chloride   40 mEq Oral Once   tamsulosin   0.4 mg Oral BID     LOS: 11 days    Time spent: 55 minutes    Chanel Mckesson JONETTA Fairly, DO [facility]  If 7PM-7AM, please contact night-coverage [email or web address] 06/14/2024, 1:26 PM   "

## 2024-06-15 DIAGNOSIS — I5033 Acute on chronic diastolic (congestive) heart failure: Secondary | ICD-10-CM | POA: Diagnosis not present

## 2024-06-15 LAB — CBC
HCT: 34.3 % — ABNORMAL LOW (ref 39.0–52.0)
Hemoglobin: 10.5 g/dL — ABNORMAL LOW (ref 13.0–17.0)
MCH: 28.6 pg (ref 26.0–34.0)
MCHC: 30.6 g/dL (ref 30.0–36.0)
MCV: 93.5 fL (ref 80.0–100.0)
Platelets: 198 K/uL (ref 150–400)
RBC: 3.67 MIL/uL — ABNORMAL LOW (ref 4.22–5.81)
RDW: 16.1 % — ABNORMAL HIGH (ref 11.5–15.5)
WBC: 11.7 K/uL — ABNORMAL HIGH (ref 4.0–10.5)
nRBC: 0 % (ref 0.0–0.2)

## 2024-06-15 LAB — GLUCOSE, CAPILLARY
Glucose-Capillary: 218 mg/dL — ABNORMAL HIGH (ref 70–99)
Glucose-Capillary: 233 mg/dL — ABNORMAL HIGH (ref 70–99)
Glucose-Capillary: 264 mg/dL — ABNORMAL HIGH (ref 70–99)
Glucose-Capillary: 315 mg/dL — ABNORMAL HIGH (ref 70–99)
Glucose-Capillary: 341 mg/dL — ABNORMAL HIGH (ref 70–99)

## 2024-06-15 LAB — BASIC METABOLIC PANEL WITH GFR
Anion gap: 14 (ref 5–15)
BUN: 49 mg/dL — ABNORMAL HIGH (ref 8–23)
CO2: 28 mmol/L (ref 22–32)
Calcium: 10.1 mg/dL (ref 8.9–10.3)
Chloride: 104 mmol/L (ref 98–111)
Creatinine, Ser: 1.3 mg/dL — ABNORMAL HIGH (ref 0.61–1.24)
GFR, Estimated: 52 mL/min — ABNORMAL LOW
Glucose, Bld: 214 mg/dL — ABNORMAL HIGH (ref 70–99)
Potassium: 3.6 mmol/L (ref 3.5–5.1)
Sodium: 146 mmol/L — ABNORMAL HIGH (ref 135–145)

## 2024-06-15 LAB — MAGNESIUM: Magnesium: 2.4 mg/dL (ref 1.7–2.4)

## 2024-06-15 MED ORDER — POTASSIUM CHLORIDE 20 MEQ PO PACK
40.0000 meq | PACK | Freq: Every day | ORAL | Status: DC
  Administered 2024-06-15 – 2024-06-16 (×2): 40 meq via ORAL
  Filled 2024-06-15 (×2): qty 2

## 2024-06-15 MED ORDER — QUETIAPINE FUMARATE 25 MG PO TABS
12.5000 mg | ORAL_TABLET | Freq: Two times a day (BID) | ORAL | Status: DC
  Administered 2024-06-15 – 2024-06-16 (×3): 12.5 mg via ORAL
  Filled 2024-06-15 (×3): qty 1

## 2024-06-15 NOTE — Progress Notes (Signed)
 " PROGRESS NOTE    Guy Roberson  FMW:[id number] DOB: July 18, 1931 DOA: 06/03/2024 PCP: [name], MD   Brief Narrative:   Guy Roberson is a 89 y.o. male with medical history significant for CHF, severe valvular issues, severe TR, moderate MR, pulmonary hypertension, CKD, hypertension, diabetes mellitus, complete heart block pacemaker status. Patient presented to the ED with complaints of weight gain of 8 pounds over the past 1 week, with abdominal distention. Also AMS  Hospitalized 10/28 to 11/7 also for CHF, diuresed with IV Lasix .  proBNP 3949.   Admitted for acute on chronic diastolic heart failure, altered mental status.  Diuresed with several days of IV Lasix  before switching to oral Lasix .  Hospital course notable for hypotension requiring vasopressors, now on midodrine .  Significant delirium, will plan to start Seroquel  1/15.  Now has SNF authorization and can discharge once delirium has further improved.  Assessment & Plan:   Principal Problem:   Acute on chronic diastolic CHF (congestive heart failure) (HCC) Active Problems:   Acute metabolic encephalopathy   Urinary tract infection without hematuria   Type 2 diabetes mellitus with other circulatory complications (HCC)   Essential hypertension   Complete heart block (HCC)   Pacemaker-Medtronic   CKD stage 3a, GFR 45-59 ml/min (HCC)   Thrombocytopenia  Assessment and Plan:   Acute on chronic diastolic CHF (congestive heart failure) (HCC) Mild systolic heart failure, EF 50% per echocardiogram echo 2025 Severe MR and hypertension Moderate MR Severe TR -Has been having recurrent issues recently. -Patient was told in the past that he was not a surgical candidate for fixing valves. -Continue diuresis with Lasix , switch to p.o. 40 mg twice daily on 1/12. - Follow daily weights/strict intake and output. -BP has been borderline but is staying steady over the past several days.- Continue to follow - -Monitor renal  function and electrolytes closely.   -- Palliative is on board.  Plan to continue current management with no escalation of care if declining. - Patient has SNF follow-up, awaiting improvement in delirium   Hyperkalemia Replacing as needed.  Was as low as 2.7.   Acute metabolic encephalopathy/hospital-acquired delirium -exacerbated by UTI and ongoing illness - Continue constant orientation - Avoid sedative agents and continue constant reorientation -Started on Seroquel  1/15   Hypothermia-resolved   Urinary tract infection without hematuria Patient with recurrent UTIs - Suppression antibiotic given at baseline - Both urine and blood culture negative.  Ceftriaxone  discontinued, on suppression with Cipro .  Dose change per renal function - Foley has been discontinued    Essential hypertension - Was on Levophed , currently off. Midodrine  added 1/8 due to borderline low BP.  Will continue 10 mg 3 times daily.  Received IV albumin  1/8 - Discontinue Solu-Cortef  1/14   Type 2 diabetes/hypoglycemia: Was on D10 drip for many days, discontinued 1/9, blood sugars improving with oral intake.   H/o Complete heart block (HCC) -Status post pacemaker implantation - Continue telemetry monitoring - Follow electrolytes stability and follow   CKD stage 3B baseline GFR results. -Appears to be stable - Continue to follow renal function trend - Continue to minimize nephrotoxic agents   Thrombocytopenia - Probably associated with acute infection - No overt bleeding appreciated on exam - Follow platelet counts trend and continue minimizing/avoiding the use of heparin  products. -Monitor in am -DC aspirin    Social/ethics - Patient DNR/DNI -Overall condition/prognosis guarded - Palliative care has been consulted and will follow further recommendations. Current goal is to continue current management  with hopes of improvement.  Fortunately improving.  No escalation of care.   DVT prophylaxis:  SCDs Code Status: DNR Family Communication: None at bedside Disposition Plan:  Status is: Inpatient Remains inpatient appropriate because: Need for IV medications.  Consultants:  None  Procedures:  None  Antimicrobials:  Anti-infectives (From admission, onward)    Start     Dose/Rate Route Frequency Ordered Stop   06/09/24 1000  ciprofloxacin  (CIPRO ) tablet 250 mg       Note to Pharmacy: X7 days starting 05/29/24     250 mg Oral Every 24 hours 06/08/24 1108     06/07/24 2200  ciprofloxacin  (CIPRO ) tablet 250 mg  Status:  Discontinued       Note to Pharmacy: X7 days starting 05/29/24     250 mg Oral Every 12 hours 06/07/24 1658 06/08/24 1108   06/04/24 1700  cefTRIAXone  (ROCEPHIN ) 1 g in sodium chloride  0.9 % 100 mL IVPB  Status:  Discontinued        1 g 200 mL/hr over 30 Minutes Intravenous Every 24 hours 06/03/24 2017 06/04/24 0325   06/04/24 1700  cefTRIAXone  (ROCEPHIN ) 2 g in sodium chloride  0.9 % 100 mL IVPB  Status:  Discontinued        2 g 200 mL/hr over 30 Minutes Intravenous Every 24 hours 06/04/24 0325 06/07/24 1657   06/03/24 1700  cefTRIAXone  (ROCEPHIN ) 1 g in sodium chloride  0.9 % 100 mL IVPB        1 g 200 mL/hr over 30 Minutes Intravenous  Once 06/03/24 1656 06/03/24 1738      Subjective: Patient seen and evaluated today with ongoing confusion noted this morning and is being fed breakfast.  No acute overnight events noted.  Objective: Vitals:   06/14/24 0513 06/14/24 1535 06/14/24 2040 06/15/24 0536  BP: 132/72 137/75 (!) 142/81 (!) 140/76  Pulse: 96 88 97 (!) 106  Resp: 18 18 16 20   Temp: (!) 97.4 F (36.3 C) (!) 97.5 F (36.4 C) 97.8 F (36.6 C) 98.3 F (36.8 C)  TempSrc: Oral Oral Oral Oral  SpO2: 92% 96% 95% 92%  Weight:    78.9 kg  Height:        Intake/Output Summary (Last 24 hours) at 06/15/2024 1035 Last data filed at 06/14/2024 2300 Gross per 24 hour  Intake --  Output 1400 ml  Net -1400 ml   Filed Weights   06/13/24 0410 06/14/24  0439 06/15/24 0536  Weight: 81.6 kg 76.7 kg 78.9 kg    Examination:  General exam: Appears calm and comfortable, pleasantly confused Respiratory system: Clear to auscultation. Respiratory effort normal. Cardiovascular system: S1 & S2 heard, RRR.  Gastrointestinal system: Abdomen is soft Central nervous system: Alert and awake Extremities: No edema Skin: No significant lesions noted Psychiatry: Confused    Data Reviewed: I have personally reviewed following labs and imaging studies  CBC: Recent Labs  Lab 06/09/24 0539 06/15/24 0513  WBC 6.6 11.7*  NEUTROABS 5.2  --   HGB 9.1* 10.5*  HCT 29.4* 34.3*  MCV 93.3 93.5  PLT 79* 198   Basic Metabolic Panel: Recent Labs  Lab 06/09/24 0539 06/10/24 0326 06/12/24 0425 06/15/24 0513  NA 141 138 141 146*  K 3.5 3.5 3.4* 3.6  CL 101 98 101 104  CO2 25 31 34* 28  GLUCOSE 163* 159* 157* 214*  BUN 49* 59* 58* 49*  CREATININE 1.62* 1.75* 1.33* 1.30*  CALCIUM  8.8* 8.9 9.3 10.1  MG 2.3  --   --  2.4   GFR: Estimated Creatinine Clearance: 35.3 mL/min (A) (by C-G formula based on SCr of 1.3 mg/dL (H)). Liver Function Tests: No results for input(s): AST, ALT, ALKPHOS, BILITOT, PROT, ALBUMIN  in the last 168 hours. No results for input(s): LIPASE, AMYLASE in the last 168 hours. No results for input(s): AMMONIA in the last 168 hours. Coagulation Profile: No results for input(s): INR, PROTIME in the last 168 hours. Cardiac Enzymes: No results for input(s): CKTOTAL, CKMB, CKMBINDEX, TROPONINI in the last 168 hours. BNP (last 3 results) Recent Labs    04/03/24 0429 04/13/24 0430 06/03/24 1416  PROBNP 10,093.0* 5,179.0* 3,722.0*   HbA1C: No results for input(s): HGBA1C in the last 72 hours. CBG: Recent Labs  Lab 06/14/24 1114 06/14/24 1610 06/14/24 1930 06/15/24 0001 06/15/24 0740  GLUCAP 273* 252* 267* 233* 218*   Lipid Profile: No results for input(s): CHOL, HDL, LDLCALC,  TRIG, CHOLHDL, LDLDIRECT in the last 72 hours. Thyroid Function Tests: No results for input(s): TSH, T4TOTAL, FREET4, T3FREE, THYROIDAB in the last 72 hours. Anemia Panel: No results for input(s): VITAMINB12, FOLATE, FERRITIN, TIBC, IRON, RETICCTPCT in the last 72 hours. Sepsis Labs: No results for input(s): PROCALCITON, LATICACIDVEN in the last 168 hours.  No results found for this or any previous visit (from the past 240 hours).       Radiology Studies: No results found.      Scheduled Meds:  Chlorhexidine  Gluconate Cloth  6 each Topical Daily   ciprofloxacin   250 mg Oral Q24H   feeding supplement  237 mL Oral BID BM   furosemide   40 mg Oral BID   midodrine   10 mg Oral TID WC   potassium chloride   40 mEq Oral Daily   QUEtiapine   12.5 mg Oral BID   tamsulosin   0.4 mg Oral BID     LOS: 12 days    Time spent: 55 minutes    Andren Bethea JONETTA Fairly, DO [facility]  If 7PM-7AM, please contact night-coverage [email or web address] 06/15/2024, 10:35 AM   "

## 2024-06-15 NOTE — Inpatient Diabetes Management (Signed)
 Inpatient Diabetes Program Recommendations  AACE/ADA: New Consensus Statement on Inpatient Glycemic Control   Target Ranges:  Prepandial:   less than 140 mg/dL      Peak postprandial:   less than 180 mg/dL (1-2 hours)      Critically ill patients:  140 - 180 mg/dL    Latest Reference Range & Units 06/14/24 08:08 06/14/24 11:14 06/14/24 16:10 06/14/24 19:30 06/15/24 00:01  Glucose-Capillary 70 - 99 mg/dL 819 (H) 726 (H) 747 (H) 267 (H) 233 (H)   Review of Glycemic Control  Diabetes history: DM2 Outpatient Diabetes medications: None Current orders for Inpatient glycemic control: CBGs Q4H  Inpatient Diabetes Program Recommendations:    Insulin : CBGs consistently in 200's.  May want to consider ordering Novolog  0-9 units Q4H.  Thanks, Earnie Gainer, RN, MSN, CDCES Diabetes Coordinator Inpatient Diabetes Program [phone] (Team Pager from 8am to 5pm)

## 2024-06-15 NOTE — Plan of Care (Signed)

## 2024-06-15 NOTE — Progress Notes (Signed)
 Palliative:  Chart review completed.  Guy Roberson has been approved for short term rehab at [facility].  He is anticipated to discharge to rehab in the next few days. No additional needs at this time.   Plan:  Continue to treat the treatable, but no CPR or intubation.  STR at [facility] with ultimate goal of returning home.  OPP with [facility] scheduled.   No charge  Lorenza Birkenhead, NP Palliative Medicine Team  Team Phone [phone]

## 2024-06-16 LAB — GLUCOSE, CAPILLARY
Glucose-Capillary: 193 mg/dL — ABNORMAL HIGH (ref 70–99)
Glucose-Capillary: 202 mg/dL — ABNORMAL HIGH (ref 70–99)
Glucose-Capillary: 254 mg/dL — ABNORMAL HIGH (ref 70–99)
Glucose-Capillary: 262 mg/dL — ABNORMAL HIGH (ref 70–99)

## 2024-06-16 LAB — BASIC METABOLIC PANEL WITH GFR
Anion gap: 10 (ref 5–15)
BUN: 48 mg/dL — ABNORMAL HIGH (ref 8–23)
CO2: 30 mmol/L (ref 22–32)
Calcium: 9.8 mg/dL (ref 8.9–10.3)
Chloride: 107 mmol/L (ref 98–111)
Creatinine, Ser: 1.34 mg/dL — ABNORMAL HIGH (ref 0.61–1.24)
GFR, Estimated: 50 mL/min — ABNORMAL LOW
Glucose, Bld: 216 mg/dL — ABNORMAL HIGH (ref 70–99)
Potassium: 3.9 mmol/L (ref 3.5–5.1)
Sodium: 147 mmol/L — ABNORMAL HIGH (ref 135–145)

## 2024-06-16 LAB — CBC
HCT: 32.5 % — ABNORMAL LOW (ref 39.0–52.0)
Hemoglobin: 10 g/dL — ABNORMAL LOW (ref 13.0–17.0)
MCH: 29.2 pg (ref 26.0–34.0)
MCHC: 30.8 g/dL (ref 30.0–36.0)
MCV: 95 fL (ref 80.0–100.0)
Platelets: 194 K/uL (ref 150–400)
RBC: 3.42 MIL/uL — ABNORMAL LOW (ref 4.22–5.81)
RDW: 16.1 % — ABNORMAL HIGH (ref 11.5–15.5)
WBC: 9.1 K/uL (ref 4.0–10.5)
nRBC: 0 % (ref 0.0–0.2)

## 2024-06-16 LAB — MAGNESIUM: Magnesium: 2.5 mg/dL — ABNORMAL HIGH (ref 1.7–2.4)

## 2024-06-16 MED ORDER — QUETIAPINE FUMARATE 25 MG PO TABS: 12.5000 mg | ORAL_TABLET | Freq: Two times a day (BID) | ORAL | 0 refills | Status: DC

## 2024-06-16 MED ORDER — ENSURE PLUS HIGH PROTEIN PO LIQD: 237.0000 mL | Freq: Two times a day (BID) | ORAL | 1 refills | Status: AC

## 2024-06-16 MED ORDER — CIPROFLOXACIN HCL 250 MG PO TABS: 250.0000 mg | ORAL_TABLET | ORAL | 0 refills | Status: DC

## 2024-06-16 MED ORDER — MIDODRINE HCL 10 MG PO TABS: 10.0000 mg | ORAL_TABLET | Freq: Three times a day (TID) | ORAL | 0 refills | Status: DC

## 2024-06-16 MED ORDER — FUROSEMIDE 40 MG PO TABS: 60.0000 mg | ORAL_TABLET | Freq: Every day | ORAL | 0 refills | Status: DC

## 2024-06-16 NOTE — Discharge Summary (Signed)
 Physician Discharge Summary  Guy Roberson FMW:[id number] DOB: 1931/10/15 DOA: 06/03/2024  PCP: [name], MD  Admit date: 06/03/2024  Discharge date: 06/16/2024  Admitted From:Home  Disposition:  SNF  Recommendations for Outpatient Follow-up:  Follow up with PCP in 1-2 weeks Please obtain BMP in 1 week to reassess sodium levels which were 147 on discharge.  Lasix  held on 1/16, and can resume on 1/17 as prescribed Continue Seroquel  12.5 mg twice daily as prescribed to assist with delirium Continue on midodrine  10 mg 3 times daily Ciprofloxacin  daily for suppression of UTI as he has frequent reoccurrence Continue other home medications as noted below  Home Health: None  Equipment/Devices: Nasal cannula  Discharge Condition:Stable  CODE STATUS: DNR  Diet recommendation: Dysphagia 2  Brief/Interim Summary:  Guy Roberson is a 89 y.o. male with medical history significant for CHF, severe valvular issues, severe TR, moderate MR, pulmonary hypertension, CKD, hypertension, diabetes mellitus, complete heart block pacemaker status. Patient presented to the ED with complaints of weight gain of 8 pounds over the past 1 week, with abdominal distention. Also AMS  Hospitalized 10/28 to 11/7 also for CHF, diuresed with IV Lasix .  proBNP 3949.   Admitted for acute on chronic diastolic heart failure, altered mental status.  Diuresed with several days of IV Lasix  before switching to oral Lasix .  Hospital course notable for hypotension requiring vasopressors, now on midodrine .  He was noted to have UTI and has completed treatment for this and now remains on ciprofloxacin  for suppression due to recurrence of UTIs.  Significant delirium throughout the course of hospitalization noted, will plan to start Seroquel  1/15.  Patient now has SNF authorization and can discharge as delirium appears to have improved.  Blood pressures remain stable on midodrine .   Discharge Diagnoses:  Principal  Problem:   Acute on chronic diastolic CHF (congestive heart failure) (HCC) Active Problems:   Acute metabolic encephalopathy   Urinary tract infection without hematuria   Type 2 diabetes mellitus with other circulatory complications (HCC)   Essential hypertension   Complete heart block (HCC)   Pacemaker-Medtronic   CKD stage 3a, GFR 45-59 ml/min (HCC)   Thrombocytopenia  Principal discharge diagnosis: Acute on chronic diastolic CHF exacerbation with acute metabolic encephalopathy secondary to UTI and persistent hospital-acquired delirium.  Discharge Instructions  Discharge Instructions     Increase activity slowly   Complete by: As directed       Allergies as of 06/16/2024       Reactions   Other Other (See Comments)   All sleep aids Makes him crazy, hallucinations, vivid dreams   Penicillins Other (See Comments)   Unknown  Childhood allergy Disorder of immune function        Medication List     STOP taking these medications    acetaminophen  650 MG CR tablet Commonly known as: TYLENOL    cephALEXin  250 MG capsule Commonly known as: KEFLEX    ciprofloxacin  250 MG tablet Commonly known as: CIPRO    lisinopril  10 MG tablet Commonly known as: ZESTRIL    metoprolol  tartrate 25 MG tablet Commonly known as: LOPRESSOR    Sure Comfort Pen Needles 31G X 5 MM Misc Generic drug: Insulin  Pen Needle       TAKE these medications    albuterol  108 (90 Base) MCG/ACT inhaler Commonly known as: VENTOLIN  HFA Inhale 2 puffs into the lungs every 6 (six) hours as needed for wheezing or shortness of breath.   aspirin  EC 81 MG tablet Take 1 tablet (  81 mg total) by mouth daily. Swallow whole.   Cranberry 125 MG Tabs Take 1 tablet by mouth daily.   feeding supplement Liqd Take 237 mLs by mouth 2 (two) times daily between meals.   furosemide  40 MG tablet Commonly known as: Lasix  Take 1.5 tablets (60 mg total) by mouth daily.   guaiFENesin -dextromethorphan  100-10  MG/5ML syrup Commonly known as: ROBITUSSIN DM Take 10 mLs by mouth every 4 (four) hours as needed for cough.   lactobacillus acidophilus Tabs tablet Take 1 tablet by mouth daily.   midodrine  10 MG tablet Commonly known as: PROAMATINE  Take 1 tablet (10 mg total) by mouth 3 (three) times daily with meals.   pantoprazole  20 MG tablet Commonly known as: PROTONIX  Take 20 mg by mouth daily.   phenylephrine -shark liver oil-mineral oil-petrolatum  0.25-14-74.9 % rectal ointment Commonly known as: PREPARATION H Place 1 Application rectally 2 (two) times daily as needed for hemorrhoids.   polyethylene glycol 17 g packet Commonly known as: MIRALAX  / GLYCOLAX  Take 17 g by mouth daily.   potassium chloride  20 MEQ packet Commonly known as: KLOR-CON  Take 20 mEq by mouth.   QUEtiapine  25 MG tablet Commonly known as: SEROQUEL  Take 0.5 tablets (12.5 mg total) by mouth 2 (two) times daily.   senna-docusate 8.6-50 MG tablet Commonly known as: Senokot-S Take 1 tablet by mouth at bedtime as needed for mild constipation.   simvastatin  20 MG tablet Commonly known as: ZOCOR  Take 1 tablet (20 mg total) by mouth daily.   tamsulosin  0.4 MG Caps capsule Commonly known as: FLOMAX  Take 1 capsule (0.4 mg total) by mouth daily after supper. What changed: when to take this        Contact information for after-discharge care     Destination     [facility] .   Service: Skilled Nursing Contact information: [address] [place]  [phone]             Home Medical Care     [facility]) .   Service: Home Health Services Contact information: [address] [place]  [address] [phone]                    Allergies[1]  Consultations: Palliative care   Procedures/Studies: US  RENAL Result Date: 06/04/2024 CLINICAL DATA:  Acute renal failure EXAM: RENAL / URINARY  TRACT ULTRASOUND COMPLETE COMPARISON:  March 28, 2024 FINDINGS: Right Kidney: Renal measurements: 9.5 x 5.8 x 5.8 cm = volume: 168 mL. Minimally increased echogenicity of renal parenchyma is noted suggesting medical renal disease. No mass or hydronephrosis visualized. Left Kidney: Renal measurements: 11.2 x 5.6 x 5.6 cm = volume: 185 mL. Probable mild hydronephrosis is noted. Minimally increased echogenicity is noted suggesting medical renal disease. 1.5 cm exophytic simple cyst is noted. Bladder: Decompressed secondary to Foley catheter. Other: Minimal ascites is noted. IMPRESSION: 1. Probable mild left hydronephrosis is noted. 2. Minimally increased echogenicity of renal parenchyma is noted bilaterally suggesting medical renal disease. 3. Minimal ascites. Electronically Signed   By: Lynwood Landy Raddle M.D.   On: 06/04/2024 10:53   DG Chest 2 View Result Date: 06/03/2024 CLINICAL DATA:  Shortness of breath EXAM: CHEST - 2 VIEW COMPARISON:  April 01, 2024 FINDINGS: Stable cardiomegaly. Status post coronary bypass graft. Left-sided pacemaker is unchanged. Elevated right hemidiaphragm is noted with mild right basilar atelectasis possible small right pleural effusion. Possible left basilar opacity suggesting atelectasis  and/or effusion. Bony thorax is unremarkable. IMPRESSION: Bibasilar opacities as described above. Electronically Signed   By: Lynwood Landy Raddle M.D.   On: 06/03/2024 16:08   CT Head Wo Contrast Result Date: 06/03/2024 EXAM: CT HEAD WITHOUT CONTRAST 06/03/2024 03:07:00 PM TECHNIQUE: CT of the head was performed without the administration of intravenous contrast. Automated exposure control, iterative reconstruction, and/or weight based adjustment of the mA/kV was utilized to reduce the radiation dose to as low as reasonably achievable. COMPARISON: 03/28/2024 CLINICAL HISTORY: Mental status change, unknown cause FINDINGS: BRAIN AND VENTRICLES: No acute hemorrhage. No evidence of acute infarct. No  hydrocephalus. No extra-axial collection. No mass effect or midline shift. Mild generalized atrophy and white matter changes are likely within normal limits for age. Atherosclerotic calcifications are present in the cavernous carotid arteries bilaterally and at the dural margin of both vertebral arteries. No hyperdense vessel is present. ORBITS: Bilateral lens replacements are noted. The globes and orbits are otherwise within normal limits. SINUSES: No acute abnormality. SOFT TISSUES AND SKULL: No acute soft tissue abnormality. No skull fracture. IMPRESSION: 1. No acute intracranial abnormality. Electronically signed by: Lonni Necessary MD 06/03/2024 03:23 PM EST RP Workstation: [id number]     Discharge Exam: Vitals:   06/15/24 2123 06/16/24 0551  BP: (!) 141/75 122/63  Pulse: 95 78  Resp: 16 16  Temp: 97.8 F (36.6 C) 97.7 F (36.5 C)  SpO2: 99% 91%   Vitals:   06/15/24 1629 06/15/24 2123 06/16/24 0500 06/16/24 0551  BP: 124/70 (!) 141/75  122/63  Pulse: 84 95  78  Resp: 16 16  16   Temp: 97.7 F (36.5 C) 97.8 F (36.6 C)  97.7 F (36.5 C)  TempSrc: Oral Oral  Oral  SpO2: 96% 99%  91%  Weight:   77.5 kg   Height:        General: Pt is alert, awake, not in acute distress Cardiovascular: RRR, S1/S2 +, no rubs, no gallops Respiratory: CTA bilaterally, no wheezing, no rhonchi, nasal cannula Abdominal: Soft, NT, ND, bowel sounds + Extremities: no edema, no cyanosis    The results of significant diagnostics from this hospitalization (including imaging, microbiology, ancillary and laboratory) are listed below for reference.     Microbiology: No results found for this or any previous visit (from the past 240 hours).   Labs: BNP (last 3 results) Recent Labs    01/27/24 0751 02/29/24 0807  BNP 726.0* 1,379.0*   Basic Metabolic Panel: Recent Labs  Lab 06/10/24 0326 06/12/24 0425 06/15/24 0513 06/16/24 0458  NA 138 141 146* 147*  K 3.5 3.4* 3.6 3.9  CL 98 101 104  107  CO2 31 34* 28 30  GLUCOSE 159* 157* 214* 216*  BUN 59* 58* 49* 48*  CREATININE 1.75* 1.33* 1.30* 1.34*  CALCIUM  8.9 9.3 10.1 9.8  MG  --   --  2.4 2.5*   Liver Function Tests: No results for input(s): AST, ALT, ALKPHOS, BILITOT, PROT, ALBUMIN  in the last 168 hours. No results for input(s): LIPASE, AMYLASE in the last 168 hours. No results for input(s): AMMONIA in the last 168 hours. CBC: Recent Labs  Lab 06/15/24 0513 06/16/24 0458  WBC 11.7* 9.1  HGB 10.5* 10.0*  HCT 34.3* 32.5*  MCV 93.5 95.0  PLT 198 194   Cardiac Enzymes: No results for input(s): CKTOTAL, CKMB, CKMBINDEX, TROPONINI in the last 168 hours. BNP: Invalid input(s): POCBNP CBG: Recent Labs  Lab 06/15/24 1628 06/15/24 1952 06/16/24 0028 06/16/24 0417 06/16/24 0730  GLUCAP 264* 341* 262* 202* 193*   D-Dimer No results for input(s): DDIMER in the last 72 hours. Hgb A1c No results for input(s): HGBA1C in the last 72 hours. Lipid Profile No results for input(s): CHOL, HDL, LDLCALC, TRIG, CHOLHDL, LDLDIRECT in the last 72 hours. Thyroid function studies No results for input(s): TSH, T4TOTAL, T3FREE, THYROIDAB in the last 72 hours.  Invalid input(s): FREET3 Anemia work up No results for input(s): VITAMINB12, FOLATE, FERRITIN, TIBC, IRON, RETICCTPCT in the last 72 hours. Urinalysis    Component Value Date/Time   COLORURINE YELLOW 06/03/2024 1416   APPEARANCEUR HAZY (A) 06/03/2024 1416   LABSPEC 1.010 06/03/2024 1416   PHURINE 5.0 06/03/2024 1416   GLUCOSEU NEGATIVE 06/03/2024 1416   HGBUR NEGATIVE 06/03/2024 1416   BILIRUBINUR NEGATIVE 06/03/2024 1416   KETONESUR NEGATIVE 06/03/2024 1416   PROTEINUR NEGATIVE 06/03/2024 1416   UROBILINOGEN 0.2 01/25/2012 0639   NITRITE NEGATIVE 06/03/2024 1416   LEUKOCYTESUR LARGE (A) 06/03/2024 1416   Sepsis Labs Recent Labs  Lab 06/15/24 0513 06/16/24 0458  WBC 11.7* 9.1    Microbiology No results found for this or any previous visit (from the past 240 hours).   Time coordinating discharge: 35 minutes  SIGNED:   Adron JONETTA Fairly, DO [facility] 06/16/2024, 9:21 AM  If 7PM-7AM, please contact night-coverage [email or web address]     [1]  Allergies Allergen Reactions   Other Other (See Comments)    All sleep aids Makes him crazy, hallucinations, vivid dreams   Penicillins Other (See Comments)    Unknown  Childhood allergy Disorder of immune function

## 2024-06-16 NOTE — TOC Transition Note (Signed)
 Transition of Care [facility]) - Discharge Note   Patient Details  Name: Guy Roberson MRN: [id number] Date of Birth: 02-Dec-1931  Transition of Care [facility]) CM/SW Contact:  Hoy DELENA Bigness, LCSW Phone Number: 06/16/2024, 9:37 AM   Clinical Narrative:    Pt to discharge to [facility] for STR. Pt will be going to room 155. RN to call report to [phone]. Family aware and agreeable to discharge.    Final next level of care: Skilled Nursing Facility Barriers to Discharge: Barriers Resolved   Patient Goals and CMS Choice Patient states their goals for this hospitalization and ongoing recovery are:: Family wants SNF CMS Medicare.gov Compare Post Acute Care list provided to:: Patient Represenative (must comment) Choice offered to / list presented to : Adult Children [place] ownership interest in [facility].provided to:: Adult Children    Discharge Placement   Existing PASRR number confirmed : 06/12/24          Patient chooses bed at: [facility] Patient to be transferred to facility by: Staff Name of family member notified: DIL Patient and family notified of of transfer: 06/16/24  Discharge Plan and Services Additional resources added to the After Visit Summary for   In-house Referral: Clinical Social Work Discharge Planning Services: NA Post Acute Care Choice: Resumption of Svcs/PTA Provider, Home Health          DME Arranged: N/A DME Agency: NA         HH Agency: [facility]        Social Drivers of Health (SDOH) Interventions SDOH Screenings   Food Insecurity: Patient Unable To Answer (06/04/2024)  Housing: Low Risk (06/10/2024)  Transportation Needs: Patient Unable To Answer (06/04/2024)  Utilities: Patient Unable To Answer (06/04/2024)  Depression (PHQ2-9): Low Risk (08/17/2022)  Social Connections: Patient Unable To Answer (06/04/2024)  Tobacco Use: High Risk (06/03/2024)     Readmission Risk Interventions    06/16/2024    9:36 AM  06/05/2024   12:38 PM 04/07/2024    3:11 PM  Readmission Risk Prevention Plan  Transportation Screening Complete Complete Complete  PCP or Specialist Appt within 3-5 Days  Complete   HRI or Home Care Consult  Complete Complete  Social Work Consult for Recovery Care Planning/Counseling  Complete Complete  Palliative Care Screening  Not Applicable Not Applicable  Medication Review Oceanographer) Complete Complete Complete  PCP or Specialist appointment within 3-5 days of discharge Complete    HRI or Home Care Consult Complete    SW Recovery Care/Counseling Consult Complete    Palliative Care Screening Complete    Skilled Nursing Facility Complete

## 2024-06-16 NOTE — Care Management Important Message (Signed)
 Important Message  Patient Details  Name: Guy Roberson MRN: [id number] Date of Birth: 1931/12/23   Important Message Given:  Yes - Medicare IM     Guy Roberson L Guy Roberson 06/16/2024, 10:02 AM

## 2024-06-19 ENCOUNTER — Non-Acute Institutional Stay (SKILLED_NURSING_FACILITY): Payer: Self-pay | Admitting: Adult Health

## 2024-06-19 DIAGNOSIS — G9341 Metabolic encephalopathy: Secondary | ICD-10-CM | POA: Diagnosis not present

## 2024-06-19 DIAGNOSIS — N401 Enlarged prostate with lower urinary tract symptoms: Secondary | ICD-10-CM

## 2024-06-19 DIAGNOSIS — D696 Thrombocytopenia, unspecified: Secondary | ICD-10-CM

## 2024-06-19 DIAGNOSIS — N390 Urinary tract infection, site not specified: Secondary | ICD-10-CM

## 2024-06-19 DIAGNOSIS — J9611 Chronic respiratory failure with hypoxia: Secondary | ICD-10-CM

## 2024-06-19 DIAGNOSIS — R29818 Other symptoms and signs involving the nervous system: Secondary | ICD-10-CM | POA: Diagnosis not present

## 2024-06-19 DIAGNOSIS — E041 Nontoxic single thyroid nodule: Secondary | ICD-10-CM

## 2024-06-19 DIAGNOSIS — I442 Atrioventricular block, complete: Secondary | ICD-10-CM

## 2024-06-19 DIAGNOSIS — K5909 Other constipation: Secondary | ICD-10-CM | POA: Diagnosis not present

## 2024-06-19 DIAGNOSIS — I959 Hypotension, unspecified: Secondary | ICD-10-CM

## 2024-06-19 DIAGNOSIS — I5033 Acute on chronic diastolic (congestive) heart failure: Secondary | ICD-10-CM

## 2024-06-19 DIAGNOSIS — E441 Mild protein-calorie malnutrition: Secondary | ICD-10-CM

## 2024-06-19 DIAGNOSIS — K219 Gastro-esophageal reflux disease without esophagitis: Secondary | ICD-10-CM

## 2024-06-19 DIAGNOSIS — I129 Hypertensive chronic kidney disease with stage 1 through stage 4 chronic kidney disease, or unspecified chronic kidney disease: Secondary | ICD-10-CM | POA: Diagnosis not present

## 2024-06-19 DIAGNOSIS — E1159 Type 2 diabetes mellitus with other circulatory complications: Secondary | ICD-10-CM

## 2024-06-19 DIAGNOSIS — N1832 Chronic kidney disease, stage 3b: Secondary | ICD-10-CM

## 2024-06-19 DIAGNOSIS — E1169 Type 2 diabetes mellitus with other specified complication: Secondary | ICD-10-CM | POA: Diagnosis not present
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
## 2024-06-22 ENCOUNTER — Encounter: Payer: Self-pay | Admitting: Internal Medicine

## 2024-06-22 ENCOUNTER — Non-Acute Institutional Stay (SKILLED_NURSING_FACILITY): Admitting: Internal Medicine

## 2024-06-22 DIAGNOSIS — I5033 Acute on chronic diastolic (congestive) heart failure: Secondary | ICD-10-CM

## 2024-06-22 DIAGNOSIS — E441 Mild protein-calorie malnutrition: Secondary | ICD-10-CM

## 2024-06-22 DIAGNOSIS — N183 Chronic kidney disease, stage 3 unspecified: Secondary | ICD-10-CM

## 2024-06-22 DIAGNOSIS — G9341 Metabolic encephalopathy: Secondary | ICD-10-CM | POA: Diagnosis not present

## 2024-06-22 DIAGNOSIS — E1122 Type 2 diabetes mellitus with diabetic chronic kidney disease: Secondary | ICD-10-CM

## 2024-06-22 NOTE — Progress Notes (Signed)
 "   NURSING HOME LOCATION:  [facility] ROOM NUMBER: [id number]  CODE STATUS: DNR  PCP: Norleen PEDLAR. Shona, MD  This is a comprehensive admission note to this SNFperformed on this date less than 30 days from date of admission. Included are preadmission medical/surgical history; reconciled medication list; family history; social history and comprehensive review of systems.  Corrections and additions to the records were documented. Comprehensive physical exam was also performed. Additionally a clinical summary was entered for each active diagnosis pertinent to this admission in the Problem List to enhance continuity of care.  HPI: He was hospitalized 1/3 - 06/16/2024 with acute mental status changes, abdominal distention, and an 8 pound weight gain in the week PTA.  Clinically was felt to have acute on chronic diastolic congestive heart failure.  BNP was 3722.  Portable chest x-ray revealed bilateral atelectasis versus effusions.  Pressors were required because of hypotension.  He was subsequently switched to oral midodrine . Aggressive diuresis parenterally with furosemide  was subsequently transition to oral furosemide . Clinically there was concern for possible UTI; urine culture revealed no growth. Hospital course was complicated by delirium for which Seroquel  was initiated 1/15. While hospitalized glucoses ranged from a level of 41 up to 364; both were outliers.  The most recent A1c was 6.7% on 01/27/2024.  His normochromic, normocytic anemia remained relatively stable with final H/H of 10/32.5.  Albumin  was 4 and total protein 6.4.  Initial creatinine was 1.85 with a GFR of 34.  The nadir GFR was 27 and peak creatinine 2.27.  Final GFR was 50 and creatinine 1.34, compatible with stage IIIa CKD. Clinically he was felt to be stable enough for discharge to SNF as recommended by PT/OT for rehab.  Past medical and surgical history: Includes CAD; history colon polyps; history of complete heart  block; diabetes with vascular complications; GERD; dyslipidemia; essential hypertension; history of syncope; history of seizures. Surgeries and procedures include bladder surgery; colonoscopy with polypectomy; CABG; EGD with esophageal dilation; pacemaker placement; and transurethral prostatectomy.  Family history: reviewed, non contributory due to advanced age.  Social history: Nondrinker; non-smoker.   Review of systems: Clinical neurocognitive deficits made validity of responses questionable , compromising ROS completion.  He stated I do not remember going (to the hospital).  When asked what diagnoses were made,his response was nothing.  He then stated that I got down and could not get up, everything got so weak. He does validate pain in the right shoulder related to bursitis.  He seems to validate some dysphagia particularly with foods such as pork chops. His son stated that he has been markedly confused intermittently confabulating about someone amputating extremities.  Constitutional: No fever, significant weight change, fatigue  Eyes: No redness, discharge, pain, vision change ENT/mouth: No nasal congestion, purulent discharge, earache, change in hearing, sore throat  Cardiovascular: No chest pain, palpitations, paroxysmal nocturnal dyspnea, claudication, edema  Respiratory: No cough, sputum production, hemoptysis, DOE, significant snoring, apnea  Gastrointestinal: No heartburn, abdominal pain, nausea /vomiting, rectal bleeding, melena, change in bowels Genitourinary: No dysuria, hematuria, pyuria, incontinence, nocturia Dermatologic: No rash, pruritus, change in appearance of skin Neurologic: No dizziness, headache, syncope, seizures, numbness, tingling Psychiatric: No significant anxiety, depression, insomnia, anorexia Endocrine: No change in hair/skin/nails, excessive thirst, excessive hunger, excessive urination  Hematologic/lymphatic: No significant bruising, lymphadenopathy,  abnormal bleeding Allergy/immunology: No itchy/watery eyes, significant sneezing, urticaria, angioedema  Physical exam:  Pertinent or positive findings: Pattern alopecia is present; hair is disheveled.  He is transport planner.  There is a surgical deficit the left ear.  He is edentulous.  Heart sounds are distant.  Grade 1 systolic blowing murmur is suggested.  Breath sounds are decreased.  Abdomen is protuberant.  He has trace-1/2+ edema at the sock line.  Pedal pulses are not palpable.  Limb atrophy and interosseous wasting are present.  The upper extremities are stronger than the lower extremities to opposition.  There is dark hyperpigmentation, almost a bronzing over the forearms.  General appearance: no acute distress, increased work of breathing is present.   Lymphatic: No lymphadenopathy about the head, neck, axilla. Eyes: No conjunctival inflammation or lid edema is present. There is no scleral icterus. Ears:  External ear exam shows no significant lesions or deformities.   Nose:  External nasal examination shows no deformity or inflammation. Nasal mucosa are pink and moist without lesions, exudates Neck:  No thyromegaly, masses, tenderness noted.    Heart:  Normal rate and regular rhythm. S1 and S2 normal without gallop, click, rub.  Lungs: without wheezes, rhonchi, rales, rubs. Abdomen: Bowel sounds are normal.  Abdomen is soft and nontender with no organomegaly, hernias, masses. GU: Deferred  Extremities:  No cyanosis, clubbing. Neurologic exam:  Balance, Rhomberg, finger to nose testing could not be completed due to clinical state Skin: Warm & dry w/o tenting. No significant rash.  See clinical summary under each active problem in the Problem List with associated updated therapeutic plan :  Acute on chronic diastolic CHF (congestive heart failure) (HCC) CHF clinically compensated; no NVD or significant peripheral edema. No change in present cardiac regimen indicated. Because of  recurrent congestive heart failure; it is recommended that he be followed in the Cardiology Heart Failure Clinic.  Acute metabolic encephalopathy His son states that he has been confabulating about someone trying to amputate his extremities.  At this time he exhibits profound neurocognitive deficits but no agitation.  Continue to monitor.  CKD stage 3 due to type 2 diabetes mellitus (HCC) While hospitalized 1/3 - 1/16 nadir GFR was 27 and peak creatinine 2.27; final values were 50 and 1.34, indicating CKD stage IIIa.  Glucoses ranged from 41 up to 364; both outliers.  The most recent A1c on record was 6.7% on 01/27/2024.  A1c should be updated.  It is critical to avoid hypoglycemia which would mimic TIAs.  Mild protein-calorie malnutrition Albumin  4.0 and total protein 6.4.  Interosseous wasting and limb atrophy on exam.  Weakness to opposition in all extremities, greater in the lower extremities.  Nutritionist to assess at the SNF.  PT/OT at the SNF as tolerated.  "

## 2024-06-22 NOTE — Assessment & Plan Note (Signed)
 While hospitalized 1/3 - 1/16 nadir GFR was 27 and peak creatinine 2.27; final values were 50 and 1.34, indicating CKD stage IIIa.  Glucoses ranged from 41 up to 364; both outliers.  The most recent A1c on record was 6.7% on 01/27/2024.  A1c should be updated.  It is critical to avoid hypoglycemia which would mimic TIAs.

## 2024-06-22 NOTE — Assessment & Plan Note (Addendum)
 His son states that he has been confabulating about someone trying to amputate his extremities.  At this time he exhibits profound neurocognitive deficits but no agitation.  Continue to monitor.

## 2024-06-22 NOTE — Assessment & Plan Note (Signed)
 Albumin  4.0 and total protein 6.4.  Interosseous wasting and limb atrophy on exam.  Weakness to opposition in all extremities, greater in the lower extremities.  Nutritionist to assess at the SNF.  PT/OT at the SNF as tolerated.

## 2024-06-22 NOTE — Assessment & Plan Note (Addendum)
 CHF clinically compensated; no NVD or significant peripheral edema. No change in present cardiac regimen indicated. Because of recurrent congestive heart failure; it is recommended that he be followed in the Cardiology Heart Failure Clinic.

## 2024-06-22 NOTE — Patient Instructions (Signed)
 See assessment and plan under each diagnosis in the problem list and acutely for this visit

## 2024-06-26 ENCOUNTER — Other Ambulatory Visit (HOSPITAL_COMMUNITY)
Admission: RE | Admit: 2024-06-26 | Discharge: 2024-06-26 | Disposition: A | Discharge status: Home / Self Care | Source: Skilled Nursing Facility | Attending: Internal Medicine | Admitting: Internal Medicine

## 2024-06-26 DIAGNOSIS — E1142 Type 2 diabetes mellitus with diabetic polyneuropathy: Secondary | ICD-10-CM | POA: Diagnosis present

## 2024-06-26 LAB — BUN: BUN: 33 mg/dL — ABNORMAL HIGH (ref 8–23)

## 2024-06-27 DIAGNOSIS — I959 Hypotension, unspecified: Secondary | ICD-10-CM | POA: Insufficient documentation

## 2024-06-27 DIAGNOSIS — N390 Urinary tract infection, site not specified: Secondary | ICD-10-CM | POA: Insufficient documentation

## 2024-06-27 LAB — HEMOGLOBIN A1C
Hgb A1c MFr Bld: 6.3 % — ABNORMAL HIGH (ref 4.8–5.6)
Mean Plasma Glucose: 134 mg/dL

## 2024-06-29 ENCOUNTER — Non-Acute Institutional Stay (SKILLED_NURSING_FACILITY): Payer: Self-pay | Admitting: Adult Health

## 2024-06-29 ENCOUNTER — Other Ambulatory Visit: Payer: Self-pay | Admitting: Adult Health

## 2024-06-29 DIAGNOSIS — I5032 Chronic diastolic (congestive) heart failure: Secondary | ICD-10-CM

## 2024-06-29 DIAGNOSIS — I959 Hypotension, unspecified: Secondary | ICD-10-CM

## 2024-06-29 DIAGNOSIS — E1159 Type 2 diabetes mellitus with other circulatory complications: Secondary | ICD-10-CM | POA: Diagnosis not present

## 2024-06-29 MED ORDER — FUROSEMIDE 40 MG PO TABS: 60.0000 mg | ORAL_TABLET | Freq: Every day | ORAL | 0 refills | Status: AC

## 2024-06-29 MED ORDER — POTASSIUM CHLORIDE 20 MEQ PO PACK: 20.0000 meq | PACK | Freq: Every day | ORAL | 0 refills | Status: AC

## 2024-06-29 MED ORDER — CIPROFLOXACIN HCL 250 MG PO TABS: 250.0000 mg | ORAL_TABLET | ORAL | 0 refills | Status: AC

## 2024-06-29 MED ORDER — SIMVASTATIN 20 MG PO TABS: 20.0000 mg | ORAL_TABLET | Freq: Every day | ORAL | 0 refills | Status: AC

## 2024-06-29 MED ORDER — MIDODRINE HCL 10 MG PO TABS: 10.0000 mg | ORAL_TABLET | Freq: Three times a day (TID) | ORAL | 0 refills | Status: AC

## 2024-06-29 MED ORDER — ALBUTEROL SULFATE HFA 108 (90 BASE) MCG/ACT IN AERS: 2.0000 | INHALATION_SPRAY | Freq: Four times a day (QID) | RESPIRATORY_TRACT | 0 refills | Status: AC | PRN

## 2024-06-29 MED ORDER — TAMSULOSIN HCL 0.4 MG PO CAPS: 0.4000 mg | ORAL_CAPSULE | Freq: Every day | ORAL | 0 refills | Status: AC

## 2024-06-29 NOTE — Progress Notes (Signed)
 " Location:  [facility] Room Number: 155 Place of Service:  SNF (31)   CODE STATUS: ful  Allergies[1]  Chief Complaint  Patient presents with   Discharge Note    HPI:  He is being discharged to home with home health for pt/ot. He will need his prescriptions written and will need to follow up with his medical provider. He will not need dme. He had been hospitalized for acute congestive heart failure treated with IV lasix  changed to po upon discharge from hospital. He was treated for an UTI (no culture) and was placed on suppressive therapy. He was admitted to this facility for short term rehab: ambulate 15 feet with max assist and rolling walker; upper body mod assist lower body max assist.   Past Medical History:  Diagnosis Date   Arthritis    CAD (coronary artery disease)    a.  90% LAD stenosis in 1995 treated with PTCA;  b. 09/2009 CABG x 4: LIMA->LAD, VG->Diag, VG->OM, VG->PDA;  c. 11/2011 Cath: 3VD, 4/4 patent grafts, EF 70%.   Chronic constipation    Colonic polyp    Complete heart block (HCC)    a. 11/2011 s/p MDT Adapta L ADDRL 1 Ser # [id number] H.   Complication of anesthesia    PROBLEMS VOIDING AFTER PREVIOUS SURGERIES   Diabetes mellitus with vascular complication (HCC)    NOT ON ANY DIABETIC MEDICATIONS   GERD (gastroesophageal reflux disease)    Hyperlipidemia    Hypertension    Pacemaker 11/2011   DR.  G.TAYLOR AND DR. DOROTHA RAKERS   Pseudophakia of both eyes    Seizures (HCC)    a. prior to diagnosis of heart block and syncope, this was in the differential and pt was briefly on Keppra , started by ER MD.   Syncope    a. 11/2011 18 second pauses noted on Event Monitor assoc w/ syncope   Trauma 1952   Abdominal and chest in 1952; multiple injuries including pelvic fracture, rib fractures and ruptured bladder   Urinary retention    PT HAS INDWELLING FOLEY CATHETER    Past Surgical History:  Procedure Laterality Date   BIOPSY  07/05/2019    Procedure: BIOPSY;  Surgeon: Golda Claudis PENNER, MD;  Location: [facility];  Service: Endoscopy;;  gastric   BLADDER SURGERY  1950'S   CATARACT EXTRACTION W/PHACO Right 11/22/2012   Procedure: CATARACT EXTRACTION PHACO AND INTRAOCULAR LENS PLACEMENT (IOC);  Surgeon: Oneil T. Roz, MD;  Location: [facility];  Service: Ophthalmology;  Laterality: Right;  CDE 10.27   CATARACT EXTRACTION W/PHACO Left 12/20/2012   Procedure: CATARACT EXTRACTION PHACO AND INTRAOCULAR LENS PLACEMENT (IOC);  Surgeon: Oneil T. Roz, MD;  Location: [facility];  Service: Ophthalmology;  Laterality: Left;  CDE:6.67   COLONOSCOPY W/ POLYPECTOMY     CORONARY ARTERY BYPASS GRAFT  09/2009   4 vessels   ESOPHAGEAL DILATION  07/05/2019   Procedure: ESOPHAGEAL DILATION;  Surgeon: Golda Claudis PENNER, MD;  Location: [facility];  Service: Endoscopy;;   ESOPHAGOGASTRODUODENOSCOPY (EGD) WITH PROPOFOL  N/A 07/05/2019   Procedure: ESOPHAGOGASTRODUODENOSCOPY (EGD) WITH PROPOFOL ;  Surgeon: Golda Claudis PENNER, MD;  Location: [facility];  Service: Endoscopy;  Laterality: N/A;  110-office notified pt new arrival time 10:45am   FEMORAL HERNIA REPAIR     INSERTION OF SUPRAPUBIC CATHETER  04/04/2012   Procedure: INSERTION OF SUPRAPUBIC CATHETER;  Surgeon: Oneil JAYSON Rafter, MD;  Location: [facility];  Service: Urology;  Laterality: N/A;   LEAD  REVISION N/A 12/30/2011   Procedure: LEAD REVISION;  Surgeon: Danelle LELON Birmingham, MD;  Location: [facility] CATH LAB;  Service: Cardiovascular;  Laterality: N/A;   LEFT HEART CATHETERIZATION WITH CORONARY/GRAFT ANGIOGRAM  12/29/2011   Procedure: LEFT HEART CATHETERIZATION WITH EL BILE;  Surgeon: Ozell Fell, MD;  Location: [facility] CATH LAB;  Service: Cardiovascular;;   PACEMAKER PLACEMENT     PPM GENERATOR CHANGEOUT N/A 03/09/2024   Procedure: PPM GENERATOR CHANGEOUT;  Surgeon: Birmingham Danelle LELON, MD;  Location: [facility] INVASIVE CV LAB;  Service: Cardiovascular;  Laterality: N/A;   TEMPORARY PACEMAKER INSERTION N/A 12/29/2011    Procedure: TEMPORARY PACEMAKER INSERTION;  Surgeon: Ozell Fell, MD;  Location: [facility] CATH LAB;  Service: Cardiovascular;  Laterality: N/A;   TRANSURETHRAL PROSTATECTOMY WITH GYRUS INSTRUMENTS  04/04/2012   Procedure: TRANSURETHRAL PROSTATECTOMY WITH GYRUS INSTRUMENTS;  Surgeon: Mark C Ottelin, MD;  Location: [facility];  Service: Urology;  Laterality: N/A;  TURP with Gyrus and Suprapubic Tube Placement   YAG LASER APPLICATION Left 06/25/2015   Procedure: YAG LASER APPLICATION;  Surgeon: Oneil Platts, MD;  Location: [facility];  Service: Ophthalmology;  Laterality: Left;    Social History   Socioeconomic History   Marital status: Married    Spouse name: Not on file   Number of children: Not on file   Years of education: Not on file   Highest education level: Not on file  Occupational History   Occupation: Retired  Tobacco Use   Smoking status: Never   Smokeless tobacco: Current    Types: Chew  Vaping Use   Vaping status: Never Used  Substance and Sexual Activity   Alcohol use: No   Drug use: No   Sexual activity: Yes  Other Topics Concern   Not on file  Social History Narrative   Not on file   Social Drivers of Health   Tobacco Use: High Risk (06/22/2024)   Patient History    Smoking Tobacco Use: Never    Smokeless Tobacco Use: Current    Passive Exposure: Not on file  Financial Resource Strain: Not on file  Food Insecurity: Patient Unable To Answer (06/04/2024)   Epic    Worried About Programme Researcher, Broadcasting/film/video in the Last Year: Patient unable to answer    Ran Out of Food in the Last Year: Patient unable to answer  Transportation Needs: Patient Unable To Answer (06/04/2024)   Epic    Lack of Transportation (Medical): Patient unable to answer    Lack of Transportation (Non-Medical): Patient unable to answer  Physical Activity: Not on file  Stress: Not on file  Social Connections: Patient Unable To Answer (06/04/2024)   Social Connection and Isolation Panel    Frequency of Communication  with Friends and Family: Patient unable to answer    Frequency of Social Gatherings with Friends and Family: Patient unable to answer    Attends Religious Services: Patient unable to answer    Active Member of Clubs or Organizations: Patient unable to answer    Attends Banker Meetings: Patient unable to answer    Marital Status: Patient unable to answer  Intimate Partner Violence: Patient Unable To Answer (06/04/2024)   Epic    Fear of Current or Ex-Partner: Patient unable to answer    Emotionally Abused: Patient unable to answer    Physically Abused: Patient unable to answer    Sexually Abused: Patient unable to answer  Depression (PHQ2-9): Low Risk (08/17/2022)   Depression (PHQ2-9)    PHQ-2 Score: 0  Alcohol Screen: Not on file  Housing: Low Risk (06/10/2024)   Epic    Unable to Pay for Housing in the Last Year: No    Number of Times Moved in the Last Year: 0    Homeless in the Last Year: No  Utilities: Patient Unable To Answer (06/04/2024)   Epic    Threatened with loss of utilities: Patient unable to answer  Health Literacy: Not on file   Family History  Problem Relation Age of Onset   Heart block Mother    Heart murmur Mother    Aneurysm Father    Coronary artery disease Brother        x3   Lung cancer Brother       VITAL SIGNS BP 118/62   Pulse (!) 58   Temp 98 F (36.7 C)   Resp 20   Ht 5' 5 (1.651 m)   Wt 165 lb (74.8 kg)   SpO2 97%   BMI 27.46 kg/m   Outpatient Encounter Medications as of 06/29/2024  Medication Sig   albuterol  (VENTOLIN  HFA) 108 (90 Base) MCG/ACT inhaler Inhale 2 puffs into the lungs every 6 (six) hours as needed for wheezing or shortness of breath.   aspirin  EC 81 MG tablet Take 1 tablet (81 mg total) by mouth daily. Swallow whole.   ciprofloxacin  (CIPRO ) 250 MG tablet Take 1 tablet (250 mg total) by mouth daily.   Cranberry 125 MG TABS Take 1 tablet by mouth daily.   feeding supplement (ENSURE PLUS HIGH PROTEIN) LIQD Take  237 mLs by mouth 2 (two) times daily between meals.   furosemide  (LASIX ) 40 MG tablet Take 1.5 tablets (60 mg total) by mouth daily.   guaiFENesin -dextromethorphan  (ROBITUSSIN DM) 100-10 MG/5ML syrup Take 10 mLs by mouth every 4 (four) hours as needed for cough.   lactobacillus acidophilus (BACID) TABS tablet Take 1 tablet by mouth daily.   midodrine  (PROAMATINE ) 10 MG tablet Take 1 tablet (10 mg total) by mouth 3 (three) times daily with meals.   omeprazole (PRILOSEC) 20 MG capsule Take 20 mg by mouth daily.   phenylephrine -shark liver oil-mineral oil-petrolatum  (PREPARATION H) 0.25-14-74.9 % rectal ointment Place 1 Application rectally 2 (two) times daily as needed for hemorrhoids.   polyethylene glycol (MIRALAX  / GLYCOLAX ) 17 g packet Take 17 g by mouth daily.   potassium chloride  (KLOR-CON ) 20 MEQ packet Take 20 mEq by mouth daily.   senna-docusate (SENOKOT-S) 8.6-50 MG tablet Take 1 tablet by mouth at bedtime as needed for mild constipation.   simvastatin  (ZOCOR ) 20 MG tablet Take 1 tablet (20 mg total) by mouth daily.   tamsulosin  (FLOMAX ) 0.4 MG CAPS capsule Take 1 capsule (0.4 mg total) by mouth daily after supper.   No facility-administered encounter medications on file as of 06/29/2024.     SIGNIFICANT DIAGNOSTIC EXAMS  LABS  06-03-24: wbc 6.2; hgb 10.6; hct 33.4; mcv 92.3 plt 76; glucose 108; bun 48; creat 1.85 k+ 6.1; na++ 133; ca 9.6 gfr 34; protein 6.3 albumin  3.9; BNP 3722.0 urine culture no growth 06-04-24: tsh 0.413; free t4: 1.81 06-08-24: glucose 127; bun 46; creat 1.76; k+ 3.5; na++ 142; ca 8.8 gfr 36 06-15-24: wbc 11.7; hgb 10.5; hct 34.3; mcv 93.5 plt 198; glucose 214; bun 49; creat 1.30;k+ 3.5 na++ 146 ca 10.1 gfr 52   Review of Systems  Constitutional:  Negative for malaise/fatigue.  Respiratory:  Negative for cough and shortness of breath.   Cardiovascular:  Negative for chest pain, palpitations and  leg swelling.  Gastrointestinal:  Negative for abdominal pain,  constipation and heartburn.  Musculoskeletal:  Negative for back pain, joint pain and myalgias.  Skin: Negative.   Neurological:  Negative for dizziness.  Psychiatric/Behavioral:  The patient is not nervous/anxious.     Physical Exam Constitutional:      General: He is not in acute distress.    Appearance: He is well-developed. He is not diaphoretic.  Neck:     Thyroid: No thyromegaly.     Comments: Left thyroid nodule  Cardiovascular:     Rate and Rhythm: Normal rate and regular rhythm.     Heart sounds: Normal heart sounds.  Pulmonary:     Effort: Pulmonary effort is normal. No respiratory distress.     Breath sounds: Normal breath sounds.  Abdominal:     General: Bowel sounds are normal. There is no distension.     Palpations: Abdomen is soft.     Tenderness: There is no abdominal tenderness.  Musculoskeletal:        General: Normal range of motion.     Cervical back: Neck supple.  Lymphadenopathy:     Cervical: No cervical adenopathy.  Skin:    General: Skin is warm and dry.  Neurological:     Mental Status: He is alert. Mental status is at baseline.  Psychiatric:        Mood and Affect: Mood normal.     ASSESSMENT/ PLAN:   Patient is being discharged with the following home health services:    Patient is being discharged with the following durable medical equipment:    Patient has been advised to f/u with their PCP in 1-2 weeks to for a transitions of care visit.  Social services at their facility was responsible for arranging this appointment.  Pt was provided with adequate prescriptions of noncontrolled medications to reach the scheduled appointment .  For controlled substances, a limited supply was provided as appropriate for the individual patient.  If the pt normally receives these medications from a pain clinic or has a contract with another physician, these medications should be received from that clinic or physician only).    Medications have been sent to  [facility]  Time spent with patient: 40 minutes: medications; dme; home health.    Barnie Seip NP [facility]  Contact [phone] Monday through Friday 8am- 5pm  After hours call [phone]      [1]  Allergies Allergen Reactions   Other Other (See Comments)    All sleep aids Makes him crazy, hallucinations, vivid dreams   Penicillins Other (See Comments)    Unknown  Childhood allergy Disorder of immune function   "

## 2024-07-03 ENCOUNTER — Encounter

## 2024-07-20 ENCOUNTER — Encounter

## 2024-08-03 ENCOUNTER — Encounter

## 2024-10-19 ENCOUNTER — Encounter

## 2025-01-18 ENCOUNTER — Encounter

## 2025-04-19 ENCOUNTER — Encounter
